# Patient Record
Sex: Female | Born: 1945
Health system: Southern US, Community
[De-identification: ages and names within clinical notes are randomized; demographics above are authoritative.]

## PROBLEM LIST (undated history)

## (undated) DIAGNOSIS — J45909 Unspecified asthma, uncomplicated: Secondary | ICD-10-CM

## (undated) DIAGNOSIS — H409 Unspecified glaucoma: Secondary | ICD-10-CM

## (undated) DIAGNOSIS — G629 Polyneuropathy, unspecified: Secondary | ICD-10-CM

## (undated) DIAGNOSIS — D649 Anemia, unspecified: Secondary | ICD-10-CM

## (undated) DIAGNOSIS — K2970 Gastritis, unspecified, without bleeding: Secondary | ICD-10-CM

## (undated) DIAGNOSIS — M810 Age-related osteoporosis without current pathological fracture: Secondary | ICD-10-CM

## (undated) DIAGNOSIS — E785 Hyperlipidemia, unspecified: Secondary | ICD-10-CM

## (undated) DIAGNOSIS — K219 Gastro-esophageal reflux disease without esophagitis: Secondary | ICD-10-CM

## (undated) DIAGNOSIS — I639 Cerebral infarction, unspecified: Secondary | ICD-10-CM

## (undated) DIAGNOSIS — T7840XA Allergy, unspecified, initial encounter: Secondary | ICD-10-CM

## (undated) DIAGNOSIS — E119 Type 2 diabetes mellitus without complications: Secondary | ICD-10-CM

## (undated) DIAGNOSIS — Z87448 Personal history of other diseases of urinary system: Secondary | ICD-10-CM

## (undated) DIAGNOSIS — N189 Chronic kidney disease, unspecified: Secondary | ICD-10-CM

## (undated) DIAGNOSIS — D510 Vitamin B12 deficiency anemia due to intrinsic factor deficiency: Secondary | ICD-10-CM

## (undated) DIAGNOSIS — M1990 Unspecified osteoarthritis, unspecified site: Secondary | ICD-10-CM

## (undated) DIAGNOSIS — I10 Essential (primary) hypertension: Secondary | ICD-10-CM

## (undated) DIAGNOSIS — Z5189 Encounter for other specified aftercare: Secondary | ICD-10-CM

## (undated) DIAGNOSIS — H40059 Ocular hypertension, unspecified eye: Secondary | ICD-10-CM

## (undated) HISTORY — DX: Hyperlipidemia, unspecified: E78.5

## (undated) HISTORY — DX: Chronic kidney disease, unspecified: N18.9

## (undated) HISTORY — DX: Unspecified glaucoma: H40.9

## (undated) HISTORY — DX: Encounter for other specified aftercare: Z51.89

## (undated) HISTORY — DX: Age-related osteoporosis without current pathological fracture: M81.0

## (undated) HISTORY — PX: GALLBLADDER SURGERY: SHX652

## (undated) HISTORY — DX: Allergy, unspecified, initial encounter: T78.40XA

## (undated) HISTORY — DX: Unspecified asthma, uncomplicated: J45.909

---

## 1973-07-13 HISTORY — PX: TUBAL LIGATION: SHX77

## 1986-07-13 HISTORY — PX: OTHER SURGICAL HISTORY: SHX169

## 2000-10-22 ENCOUNTER — Encounter: Admission: RE | Admit: 2000-10-22 | Discharge: 2000-10-22 | Payer: Self-pay | Admitting: Oncology

## 2000-12-16 ENCOUNTER — Encounter (HOSPITAL_COMMUNITY): Admission: RE | Admit: 2000-12-16 | Discharge: 2001-01-15 | Payer: Self-pay | Admitting: Oncology

## 2001-02-09 ENCOUNTER — Encounter: Admission: RE | Admit: 2001-02-09 | Discharge: 2001-02-09 | Payer: Self-pay | Admitting: Oncology

## 2001-04-06 ENCOUNTER — Encounter: Admission: RE | Admit: 2001-04-06 | Discharge: 2001-04-06 | Payer: Self-pay | Admitting: Oncology

## 2001-05-16 ENCOUNTER — Encounter: Payer: Self-pay | Admitting: Family Medicine

## 2001-05-16 ENCOUNTER — Ambulatory Visit (HOSPITAL_COMMUNITY): Admission: RE | Admit: 2001-05-16 | Discharge: 2001-05-16 | Payer: Self-pay | Admitting: Family Medicine

## 2001-05-27 ENCOUNTER — Ambulatory Visit (HOSPITAL_COMMUNITY): Admission: RE | Admit: 2001-05-27 | Discharge: 2001-05-27 | Payer: Self-pay | Admitting: Family Medicine

## 2001-05-27 ENCOUNTER — Encounter: Payer: Self-pay | Admitting: Family Medicine

## 2001-06-01 ENCOUNTER — Encounter: Admission: RE | Admit: 2001-06-01 | Discharge: 2001-06-01 | Payer: Self-pay | Admitting: Oncology

## 2001-09-19 ENCOUNTER — Encounter: Admission: RE | Admit: 2001-09-19 | Discharge: 2001-09-19 | Payer: Self-pay | Admitting: Oncology

## 2001-09-19 ENCOUNTER — Encounter (HOSPITAL_COMMUNITY): Admission: RE | Admit: 2001-09-19 | Discharge: 2001-10-19 | Payer: Self-pay | Admitting: Oncology

## 2002-01-04 ENCOUNTER — Encounter: Admission: RE | Admit: 2002-01-04 | Discharge: 2002-01-04 | Payer: Self-pay | Admitting: Oncology

## 2002-02-28 ENCOUNTER — Encounter: Admission: RE | Admit: 2002-02-28 | Discharge: 2002-02-28 | Payer: Self-pay | Admitting: Oncology

## 2002-04-25 ENCOUNTER — Encounter: Admission: RE | Admit: 2002-04-25 | Discharge: 2002-04-25 | Payer: Self-pay | Admitting: Oncology

## 2002-06-09 ENCOUNTER — Encounter: Payer: Self-pay | Admitting: Family Medicine

## 2002-06-09 ENCOUNTER — Ambulatory Visit (HOSPITAL_COMMUNITY): Admission: RE | Admit: 2002-06-09 | Discharge: 2002-06-09 | Payer: Self-pay | Admitting: Family Medicine

## 2002-08-16 ENCOUNTER — Encounter: Admission: RE | Admit: 2002-08-16 | Discharge: 2002-08-16 | Payer: Self-pay | Admitting: Oncology

## 2002-10-11 ENCOUNTER — Encounter (HOSPITAL_COMMUNITY): Admission: RE | Admit: 2002-10-11 | Discharge: 2002-11-10 | Payer: Self-pay | Admitting: Oncology

## 2002-10-11 ENCOUNTER — Encounter: Admission: RE | Admit: 2002-10-11 | Discharge: 2002-10-11 | Payer: Self-pay | Admitting: Oncology

## 2002-12-06 ENCOUNTER — Encounter: Admission: RE | Admit: 2002-12-06 | Discharge: 2002-12-06 | Payer: Self-pay | Admitting: Oncology

## 2003-01-30 ENCOUNTER — Encounter: Admission: RE | Admit: 2003-01-30 | Discharge: 2003-01-30 | Payer: Self-pay | Admitting: Oncology

## 2003-03-27 ENCOUNTER — Encounter: Admission: RE | Admit: 2003-03-27 | Discharge: 2003-03-27 | Payer: Self-pay | Admitting: Oncology

## 2003-05-22 ENCOUNTER — Encounter: Admission: RE | Admit: 2003-05-22 | Discharge: 2003-05-22 | Payer: Self-pay | Admitting: Oncology

## 2003-07-17 ENCOUNTER — Encounter: Admission: RE | Admit: 2003-07-17 | Discharge: 2003-07-17 | Payer: Self-pay | Admitting: Oncology

## 2003-09-10 ENCOUNTER — Encounter: Admission: RE | Admit: 2003-09-10 | Discharge: 2003-09-10 | Payer: Self-pay | Admitting: Oncology

## 2003-10-15 ENCOUNTER — Encounter: Admission: RE | Admit: 2003-10-15 | Discharge: 2003-10-15 | Payer: Self-pay | Admitting: Oncology

## 2003-10-15 ENCOUNTER — Encounter (HOSPITAL_COMMUNITY): Admission: RE | Admit: 2003-10-15 | Discharge: 2003-11-14 | Payer: Self-pay | Admitting: Oncology

## 2004-10-14 ENCOUNTER — Ambulatory Visit (HOSPITAL_COMMUNITY): Payer: Self-pay | Admitting: Oncology

## 2004-10-14 ENCOUNTER — Encounter (HOSPITAL_COMMUNITY): Admission: RE | Admit: 2004-10-14 | Discharge: 2004-11-13 | Payer: Self-pay | Admitting: Oncology

## 2004-10-14 ENCOUNTER — Encounter: Admission: RE | Admit: 2004-10-14 | Discharge: 2004-10-14 | Payer: Self-pay | Admitting: Oncology

## 2004-12-31 ENCOUNTER — Ambulatory Visit (HOSPITAL_COMMUNITY): Admission: RE | Admit: 2004-12-31 | Discharge: 2004-12-31 | Payer: Self-pay | Admitting: Family Medicine

## 2005-06-09 ENCOUNTER — Ambulatory Visit (HOSPITAL_COMMUNITY): Admission: RE | Admit: 2005-06-09 | Discharge: 2005-06-09 | Payer: Self-pay | Admitting: Family Medicine

## 2005-06-10 ENCOUNTER — Ambulatory Visit (HOSPITAL_COMMUNITY): Admission: RE | Admit: 2005-06-10 | Discharge: 2005-06-10 | Payer: Self-pay | Admitting: Family Medicine

## 2006-07-13 HISTORY — PX: CHOLECYSTECTOMY: SHX55

## 2007-04-16 ENCOUNTER — Inpatient Hospital Stay (HOSPITAL_COMMUNITY): Admission: EM | Admit: 2007-04-16 | Discharge: 2007-04-20 | Payer: Self-pay | Admitting: Emergency Medicine

## 2007-04-18 ENCOUNTER — Encounter (INDEPENDENT_AMBULATORY_CARE_PROVIDER_SITE_OTHER): Payer: Self-pay | Admitting: General Surgery

## 2010-08-02 ENCOUNTER — Encounter: Payer: Self-pay | Admitting: Family Medicine

## 2010-08-04 ENCOUNTER — Ambulatory Visit (HOSPITAL_COMMUNITY)
Admission: RE | Admit: 2010-08-04 | Discharge: 2010-08-04 | Payer: Self-pay | Source: Home / Self Care | Attending: Nephrology | Admitting: Nephrology

## 2010-11-25 NOTE — H&P (Signed)
NAME:  Traci Parker, Traci Parker NO.:  [id number]   MEDICAL RECORD NO.:  [id number]         PATIENT TYPE:  EMS   LOCATION:  ED                            FACILITY:  [facility]   PHYSICIAN:  Dorris Singh, DO    DATE OF BIRTH:  09/23/1945   DATE OF ADMISSION:  04/15/2007  DATE OF DISCHARGE:  LH                              HISTORY & PHYSICAL   REPORT TITLE:  HISTORY AND PHYSICAL   The patient is a 65 year old African-American female who presented to  the Emergency Room at [facility] with a history of abdominal pain,  nausea, and vomiting that was getting progressively worse.  Nothing  relieves it, everything irritated it.  Stools were liquid at that point  in time she was evaluated here.   PAST MEDICAL HISTORY:  Significant for hypertension and depression.  Has  a history of anemia, but is not anemic now.  Currently she also has a  past medical history of depression in which she is taking medication,  but did not give [place] the name.   PAST SURGICAL HISTORY:  She has had a lumpectomy and tubal ligation.   OCCUPATIONAL HISTORY:  The patient currently is a Nature conservation officer, part-time.   SOCIAL HISTORY:  She is a nondrinker, nondrug user, has smoked though  within the last year.   ALLERGIES:  Her ALLERGIES ARE PREDNISONE, which she claims she had  hallucinations with.   CURRENT MEDICATIONS:  Diazide.   REVIEW OF SYSTEMS:  GENERAL:  Positive weakness and fatigue.  CARDIOVASCULAR:  Negative for chest pain.  RESPIRATORY:  Negative for  cough, dyspnea.  GI:  Positive for nausea, vomiting, diarrhea, and  abdominal pain as well, as the pertinent positives there.   PHYSICAL EXAMINATION:  VITAL SIGNS:  Blood pressure 157/79, pulse rate  96, respiratory rate 20, temperature 98.4.  Satting at 97% on room air.  GENERAL:  This is a 65 year old African-American female, who is well  developed, well nourished.  Answers questions appropriately.  SKIN:  Her skin has good turgor, good texture.  No  ecchymosis noted.  HEAD:  Normocephalic atraumatic.  EYES:  No scleral icterus.  PERRLA, EOMI bilaterally.  EARS:  Are patent.  TMs visualized bilaterally.  NOSE:  Turbinates are moist.  No turbinate inflammation.  THROAT:  No dentition is noted, no erythema, or exudates noted.  NECK:  Supple, full range of motion, no trachea deviation.  CHEST:  Symmetrical with respiration.  Clear to auscultation  bilaterally.  No wheezes rales or rhonchi.  CARDIOVASCULAR:  Regular rate and rhythm, no murmur noted.  ABDOMEN:  Soft, nontender, nondistended.  Mild epigastric tenderness, no  masses, guarding, or rebound noted, or CVA.  EXTREMITIES:  Positive pulses in all 4 extremities.  No ecchymosis,  cyanosis, or edema noted.  NEUROLOGIC:  Cranial nerves II-XII grossly intact.  A&O x 3, affect is  appropriate.   LABORATORY DATA:  Lipase is 1026, her urine microscopic exam there was  rare epithelial cells.  Her urine was positive for moderate blood,  negative nitrates and esterase.  Her BMP; sodium 129,  potassium 2.7,  chloride 92, carbon dioxide 28, glucose 207, BUN 10, and creatinine  1.15. Her CBC with diff white count is 17.1, hemoglobin 11.6, hematocrit  35.2, neutrophils 95% with a left shift,  platelet count is 253.  The  patient also has been sent for an abdominal CT, which is pending as  well.   ASSESSMENT:  1. Acute pancreatitis.  2. Hypertension.   PLAN:  Will admit the patient to the service of [facility] to 2A with  Telemetry.  Will keep patient n.p.o. except for meds.  Will also  institute fluid and antibiotic therapy due to the elevated white count.  Also her CT, which is pending and an ultrasound of the abdomen.  I will  repeat the blood work in the morning and will continue to monitor.      Dorris Singh, DO  Electronically Signed     CB/MEDQ  D:  04/16/2007  T:  04/16/2007  Job:  [id number]

## 2010-11-25 NOTE — Op Note (Signed)
Traci Parker, GENSEL NO.:  [id number]   MEDICAL RECORD NO.:  [id number]          PATIENT TYPE:  INP   LOCATION:  A214                          FACILITY:  [facility]   PHYSICIAN:  Tilford Pillar, MD      DATE OF BIRTH:  1946/02/12   DATE OF PROCEDURE:  04/18/2007  DATE OF DISCHARGE:                               OPERATIVE REPORT   PREOPERATIVE DIAGNOSIS:  Gallstone pancreatitis.   POSTOPERATIVE DIAGNOSIS:  Gallstone pancreatitis.   PROCEDURE:  1. Laparoscopic cholecystectomy with intraoperative cholangiogram.  2. Intraoperative fluoroscopy with interpretation.  3. Intra-abdominal drain placement with a 10 Blake drain (JP).   SURGEON:  Dr. Lovell Sheehan and Tilford Pillar, M.D.   ANESTHESIA:  General endotracheal, local anesthetic of 0.5% Marcaine.   ESTIMATED BLOOD LOSS:  250 mL.   SPECIMENS:  Gallbladder.   DISPOSITION:  Postanesthetic care unit.   INDICATIONS FOR PROCEDURE:  The patient is a 65 year old African-  American female who had presented to [facility] with  epigastric and right upper quadrant pain along with nausea and vomiting.  During her initial workup, she was diagnosed with pancreatitis.  Additional workup demonstrated likely gallstone etiology.  The risks,  benefits, and alternatives of laparoscopic and possible open  cholecystectomy with intraoperative cholangiogram were discussed at  length with the patient and the patient's family.  Risks included, but  were not limited to possible risks of infection, risks of bleeding,  possible bile leak, possible common bile duct injury or intestinal  injury, and the risk of possible intraoperative secondary to unforeseen  cardiac pulmonary disorders.  The patient's questions and families  questions and concerns were addressed and the patient was consented for  the planned procedure.   DESCRIPTION OF PROCEDURE:  The patient was taken to the operating room  and was placed in the supine  position on the operating table at which  time the general anesthesia was administered.  Once the patient was  asleep, she was endotracheally intubated by anesthesia.  At this point,  the patient's abdomen was prepped and draped in the usual sterile  fashion.  Skin incision was created supraumbilically with an 11 blade  scalpel.  A Kocher clamp was utilized to bluntly dissect down to the  fascia and grasped anteriorly.  A Veress needle was then inserted.  Once  sufficient pneumoperitoneum was obtained, an 11 mm trocar was inserted  using the scope to visualize entry into the peritoneal cavity.  The  inner cannula was removed and the laparoscope was inserted.  There was  no evidence of any trocar or Veress needle placement injury.  At this  point in time, the remaining trocars were placed and in a similar  fashion making the skin incision with an 11 blade scalpel and then  placement of the trocars under direct visualization.  An 11 mm trocar  was placed in the epigastrium, 5 mm trocar placed in the right subcostal  position in the midclavicular line, and the 5 mm trocar was placed in  the right lateral abdominal wall.  At this point, the  patient was placed  in a head-up and left lateral decubitus position to assist with  exposure.  The gallbladder was identified.  This was clearly acutely  inflamed and edematous.  A grasper was utilized to grasp the fundus of  the gallbladder and lift this up and over the liver.  At this point  blunt dissection was utilized to expose the infundibulum of the  gallbladder.  A small cholecystostomy was created inadvertently with the  grasper which did allow some bile and apparent purulent material as well  as some dark bilious stones to exude from the wound.  These were  aspirated with the suction irrigator in attempt to get as many of the  stones as possible was again attempted with both irrigation and  aspiration.  At this point continued dissection  identified the cystic  duct and what appeared to be a vascular structure heading toward the  gallbladder.  At this point the window was created behind the suspected  cystic artery.  Two Endoclips were placed proximally and one distally  and the cystic artery was divided between the two most distal clips.  At  this point continued dissection clearly identified the cystic duct.  A  window was created behind it.  The cystic duct was milked with the  [place] dissector in order to ensure no stones were present within the  duct and Endoclip was placed as duct entered into the infundibulum.  At  this point the Endoshears were brought into the field.  A small defect  was created in the cystic duct and at this point, the cholangiocatheter  was brought to the field.  This was manipulated and inserted into the  cystic duct, also clamped into position.  It easily flushed.  There was  no evidence of extravasation of the sterile saline that was used for  initial flushing.  At this point, all graspers were removed from the  abdominal cavity.  Fluoroscopy was brought to the field and contrast was  infused through the cholangiocatheter system.  At this point there was  no evidence of any common bile duct stones. The common bile duct was  noted to be somewhat dilated and there was some back flushing back into  the pancreatic duct, but again no stones were identified at the ampulla  or within the common bile duct.  Another approximately 15 mL of sterile  saline were utilized then to irrigate and to flush the common bile duct.  At this point the fluoroscopy was backed away from the field and we  turned our attention to continuing the dissection.   At this point, three Endoclips were placed in the proximal cystic duct  and the remaining tissue of the cystic duct was divided between the two  most distal clips.  At this point, using electrocautery the gallbladder  was dissected free from the gallbladder fossa  of the liver.  Hemorrhage  was controlled using the electrocautery.  Again the gallbladder was  quite edematous and secondary to inflammatory changes to increase the  length of time required to remove the gallbladder from the gallbladder  fossa.  At this point, once it was free, it was placed up and over the  liver and attention at this time was returned to the gallbladder fossa  to ensure adequate hemostasis.  The field was irrigated with copious  amount of sterile saline and was aspirated until return aspirate was  clear.  Again, hemostasis was obtained using electrocautery and then  placement  of two pieces of Surgicel with the gallbladder fossa.  At this  time the patient was returned to a flat position and a 10 flat JP drain  was placed into the surgical field.  The tail of the JP drain was pulled  through the right lateral abdominal wall 5 mm trocar site.  Additionally  at this point, the endosuture closure device was utilized to pass an 0  Vicryl suture through the epigastric trocar site with the fascial suture  in position.  The camera was repositioned at the epigastric site and  again the endosuture closure device was utilized to pass a 2-0 Vicryl  through the umbilical trocar site.  With this fascial suture in place,  the camera was returned through the umbilicus.  The EndoCatch bag was  utilized to place the gallbladder and the gallbladder was removed  through the epigastric trocar site in an intact EndoCatch bag.  The  gallbladder was then placed on the back table and was sent as a  permanent specimen to pathology.  At this point the pneumoperitoneum was  evacuated.  The trocars were removed.  The drain was secured to the skin  with a 3-0 nylon suture.  The fascial Vicryl sutures were secured.  The  local anesthetic was injected and the skin edges at the remaining three  trocar sites was reapproximated using a 4-0 Monocryl suture.  The skin  edges were reapproximated and the  skin was washed and dried with a  moistened dry towel.  Benzoin was applied to the remaining three trocar  sites and half inch Steri-Strips were placed over the incision.  The  patient was awakened from general anesthesia and was transferred to her  hospital bed and was  transferred to the postanesthetic care unit in stable condition.  At the  conclusion of the procedure all instrument, sponge, and needle counts  were correct.  The patient tolerated the procedure well.  Dr. Lovell Sheehan  was also present during the entirety of the operation.      Tilford Pillar, MD  Electronically Signed     BZ/MEDQ  D:  04/18/2007  T:  04/19/2007  Job:  [id number]   cc:   Dalia Heading, M.D.  Fax: [phone]   [facility] Team

## 2010-11-25 NOTE — Consult Note (Signed)
NAMEKAYSEY, BERNDT NO.:  [id number]   MEDICAL RECORD NO.:  [id number]          PATIENT TYPE:  INP   LOCATION:  A214                          FACILITY:  [facility]   PHYSICIAN:  Tilford Pillar, MD      DATE OF BIRTH:  1946/04/27   DATE OF CONSULTATION:  04/18/2007  DATE OF DISCHARGE:                                 CONSULTATION   REASON FOR CONSULTATION:  Acute pancreatitis.   HISTORY OF PRESENT ILLNESS:  The patient is a 65 year old African-  American female with a history of nausea and vomiting and epigastric  abdominal pain.  She had presented on this admission, on October 3, with  increasing nausea, non-bloody emesis, epigastric and right upper  quadrant abdominal pain and diarrhea.  She did have some referred pain  to her upper back.  Patient states a somewhat increase in symptoms  secondarily to p.o. intake but otherwise no exacerbating or relieving  factors.  She describes the pain as sharp in nature.  In regards to past  episodes, she says that episodes have been intermittent, have resolved  spontaneously typically after several days.  She has noted somewhat of  an increase in her frequencies, but again denies any particular pattern  associated with the symptomatology.  She denies any previous history of  peptic ulcer or gastrointestinal disorders with similar etiologies.  She  does state that there is a family history of gallbladder disease as  well.  She has had 5 previous pregnancies.  She denied any episodes of  jaundice or acholic stools.   PAST MEDICAL HISTORY:  Significant for:  1. Hypertension.  2. Anemia.  3. Depression.   PAST SURGICAL HISTORY:  1. Breast lumpectomy.  2. Tubal ligation.   MEDICATIONS:  Toprol.   In the hospital she is on Xopenex, Protonix, Zosyn, Zofran as well as  her Toprol dosing.   ALLERGIES:  REPORTED TO PREDNISONE.   SOCIAL:  1. She is a half a pack per day smoker.  2. She denies any alcohol use.  3. No  recreational drugs.  4. As previously mentioned, she has had 5 pregnancies.   PHYSICAL EXAMINATION:  VITAL SIGNS:  A temperature of 99.3, heart rate  90 to 110, respiratory rate is 20, blood pressure 106/66, O2 saturation  94% on 1L nasal cannula.  GENERAL EVALUATION:  She is in no acute distress.  She is alert and  oriented.  She is sitting upright in bed with family present in the  room.  HEENT:  Pupils are equal, round, reactive, extraocular movements are  intact.  There is no scleral icterus, no cervical lymphadenopathy.  PULMONARY:  She is clear to auscultation in both lower lung fields.  Her  respirations are unlabored.  CARDIOVASCULAR:  Heart is regular rate and rhythm.  No apparent murmur,  gallops or rub are apparent.  ABDOMEN:  She has positive bowel signs.  Her abdomen is soft, moderately  obese.  She does have epigastric and right upper quadrant tenderness.  No peritoneal signs are appreciated.  She does have a positive Murphy's  sign.  No hernias or masses are apparent.  EXTREMITIES:  Warm and dry.   LABORATORY AND RADIOGRAPHIC DATA:  CBC:  White blood cell count of 16.8,  hemoglobin 8.7, hematocrit 26.1, platelets 177,000.  Chemistry:  Sodium  134, potassium 3.6, chloride 103, bicarbonate 24, BUN 16, creatinine  1.44, blood glucose 101.  She has an albumin of 2.6.  Her total  bilirubin is 2.5, this is decreased from her admission values.  Amylase  and lipase 71 and 16, respectively; these are also down from her  admission values of 873 and 330, respectively.  Radiographic  Information:  Her right upper quadrant ultrasound did demonstrate the  positive stones.  There is positive gallbladder wall thickening.  Her  common bile duct on right upper quadrant ultrasound was measured at 6.3  mm.  Additionally, a CAT scan of her abdomen and pelvis did not  demonstrate any free air or free fluid, there is significant  inflammatory changes around the head of the pancreas, no  apparent air  within the pancreas.  There is positive gallstones in her gallbladder  and again questionable common bile duct dilatation.   ASSESSMENT AND PLAN:  Pancreatitis and cholelithiasis, suspected  gallstone pancreatitis is etiology.  Based on her findings and her  resolution of her pancreatitis, it was recommended that she undergo a  laparoscopic, possible open, cholecystectomy with intraoperative  cholangiogram.  The risks, benefits and alternatives were discussed at  length with the patient and the patient's family.  She is currently in a  nothing by mouth status, we will continue this with plans of taking the  patient to the operating room today.  She has already received a dose of  Zosyn for antibiotics.  This will be sufficient for her preoperative  antibiotic and will plan on stopping this as, again, there is no  evidence of any infectious or necrotic aspect to her pancreatitis.  Additionally, I will obtain an EKG due to the patient's age; however,  low suspicion of any cardiac process or cardiac history is suspected  based on the patient's history and physical findings.      Tilford Pillar, MD  Electronically Signed     BZ/MEDQ  D:  04/18/2007  T:  04/18/2007  Job:  [id number]   cc:   Patient's permanent record   Dr. Carollee Sires Team

## 2010-11-25 NOTE — Discharge Summary (Signed)
NAMEFEY, COGHILL NO.:  [id number]   MEDICAL RECORD NO.:  [id number]          PATIENT TYPE:  INP   LOCATION:  A214                          FACILITY:  [facility]   PHYSICIAN:  Tilford Pillar, MD      DATE OF BIRTH:  04-10-1946   DATE OF ADMISSION:  04/15/2007  DATE OF DISCHARGE:  10/08/2008LH                               DISCHARGE SUMMARY   ADMISSION DIAGNOSES:  Acute pancreatitis.   DISCHARGE DIAGNOSES:  1. Status post laparoscopic cholecystectomy for gallstone      pancreatitis.  2. Hypertension.  3. Depression.  4. Anemia.   ADMITTING PHYSICIAN:  Dr. Dorris Singh.   PROCEDURES:  Laparoscopic cholecystectomy.   DISPOSITION:  Home.   HISTORY AND PHYSICAL:  Please see the admission history and physical for  the complete H&P.  Patient is a 65 year old African-American female who  presented with acute abdominal pain, nausea and vomiting.  She was  evaluated and was determined to have acute pancreatitis.  During the  workup, it was discovered that the patient had positive cholelithiasis.  Other etiologies were ruled out as possible etiologies for pancreatitis,  but this is suspected that this was related to a gallstone pancreatitis.   HOSPITAL COURSE:  The patient was admitted to the hospital on April 16, 2007, where she continued to have management for her acute pancreatitis.  During this, it was worked up and confirmed that her likely etiology was  gallstone pancreatitis.  She continued to have improvement on her blood  work with resolution of her pancreatitis as well as clinically improved  from the standpoint of nausea and pain relief.  A surgical consultation  was obtained, at which time I had the opportunity to meet the patient.  At this point, based on the review of her workup and findings, it was  recommended that she undergo a laparoscopic possible open  cholecystectomy with intraoperative cholangiogram.  Risks, benefits and  alternatives were  described at length with the patient and the patient's  family.  She had been consented for the operation and was taken to the  operating room for the above-mentioned procedure.  She was taken on  April 17, 2007.  She tolerated the procedure well.  Following her  operation, she continued to have improvement of her symptoms, was  advanced onto a regular diet.  Resolution of all of the pancreatic  enzymes.   On April 20, 2007, this patient was comfortable, tolerating a regular  diet.  Her pain was controlled.  Plans were made for discharge to home.   DISCHARGE INSTRUCTIONS:  Patient is to return to work as tolerated, if  possible, on a light duty basis for the next three weeks.  She is to  increase her activity slowly.  She is not to lift anything greater than  20 pounds for the next three weeks.  She may shower; however, she may  avoid bathing and soaking the incisions.  She is instructed not to drive  while taking the pain medications.  She will have a follow-up  appointment to see myself  in three weeks unless she has any questions,  problems, or concerns in the meantime, which she may see me sooner.   DISCHARGE MEDICATIONS:  Patient is to continue all previously prescribed  home medications, including Dyazide.  Additionally, she will be given a  prescription for Vicodin for pain control, 500 mg 1-2 tablets orally  every 4 hours as needed for pain.      Tilford Pillar, MD  Electronically Signed     BZ/MEDQ  D:  04/20/2007  T:  04/20/2007  Job:  [id number]   cc:   [facility] team   Kirk Ruths, M.D.  Fax: [phone]

## 2010-11-25 NOTE — Group Therapy Note (Signed)
Traci Parker, NOLDE NO.:  [id number]   MEDICAL RECORD NO.:  [id number]          PATIENT TYPE:  INP   LOCATION:  A214                          FACILITY:  [facility]   PHYSICIAN:  Skeet Latch, DO    DATE OF BIRTH:  26-Oct-1945   DATE OF PROCEDURE:  04/18/2007  DATE OF DISCHARGE:                                 PROGRESS NOTE   SUBJECTIVE:  The patient states that her pain is still located in right  upper and lower quadrant.  She states the pain is slightly improved but  is still present.  The patient has decreased nausea and vomiting.  The  patient is seen by surgery and scheduled for a cholecystectomy today.   OBJECTIVE:  VITAL SIGNS: Temperature is 99.3, pulse 90, respirations 20,  blood pressure 106/66.  The patient is satting at 94% on 1 liter.  CARDIOVASCULAR:  Regular rate and rhythm.  No rubs, gallops or murmurs.  LUNGS: Clear to auscultation bilaterally.  No rales, rhonchi or  wheezing.  ABDOMEN: Positive right upper quadrant and mid abdominal tenderness on  palpation.  No rigidity or guarding.  EXTREMITIES: No clubbing, cyanosis or edema.   LABORATORY DATA:  Lipase is 16, amylase 71, sodium 134, potassium 3.6,  chloride is 103, CO2 24, glucose 101, BUN 16, creatinine 1.44.  white  count is 16.8, hemoglobin at 8.7, hematocrit 26.1, platelet count is  177.   ASSESSMENT:  1. Acute pancreatitis seems to have resolved, probably secondary to      gallstones.  2. Cholelithiasis.  The patient is scheduled for a cholecystectomy      today and spoke with surgery regarding plans.  3. Renal insufficiency. Continue to IV hydrate the patient.  The      patient declined renal consult.  Will speak with the patient      regarding this after surgery.  4. Her hyponatremia seems to be improving.  Will continue present      treatment.      Skeet Latch, DO  Electronically Signed     SM/MEDQ  D:  04/18/2007  T:  04/18/2007  Job:  [id number]

## 2010-12-03 ENCOUNTER — Other Ambulatory Visit (HOSPITAL_COMMUNITY): Payer: Self-pay | Admitting: Internal Medicine

## 2010-12-03 DIAGNOSIS — I10 Essential (primary) hypertension: Secondary | ICD-10-CM

## 2010-12-03 DIAGNOSIS — Z139 Encounter for screening, unspecified: Secondary | ICD-10-CM

## 2010-12-11 ENCOUNTER — Ambulatory Visit (HOSPITAL_COMMUNITY)
Admission: RE | Admit: 2010-12-11 | Discharge: 2010-12-11 | Disposition: A | Discharge status: Home / Self Care | Payer: PRIVATE HEALTH INSURANCE | Source: Ambulatory Visit | Attending: Internal Medicine | Admitting: Internal Medicine

## 2010-12-11 ENCOUNTER — Encounter (HOSPITAL_COMMUNITY): Payer: Self-pay

## 2010-12-11 DIAGNOSIS — Z13820 Encounter for screening for osteoporosis: Secondary | ICD-10-CM | POA: Insufficient documentation

## 2010-12-11 DIAGNOSIS — Z139 Encounter for screening, unspecified: Secondary | ICD-10-CM

## 2010-12-11 DIAGNOSIS — I743 Embolism and thrombosis of arteries of the lower extremities: Secondary | ICD-10-CM | POA: Insufficient documentation

## 2010-12-11 DIAGNOSIS — I10 Essential (primary) hypertension: Secondary | ICD-10-CM

## 2010-12-11 HISTORY — DX: Essential (primary) hypertension: I10

## 2011-04-16 ENCOUNTER — Telehealth (INDEPENDENT_AMBULATORY_CARE_PROVIDER_SITE_OTHER): Payer: Self-pay | Admitting: *Deleted

## 2011-04-16 NOTE — Telephone Encounter (Signed)
She had question about her procedure on the 18th.  She got a call from her doctor about her sodium not being right.  She is going to have more labs at Dr. Gary Fleet Monday and they will call us if Sodium is high.

## 2011-04-23 LAB — COMPREHENSIVE METABOLIC PANEL
ALT: 71 — ABNORMAL HIGH
ALT: 95 — ABNORMAL HIGH
AST: 47 — ABNORMAL HIGH
Albumin: 2.6 — ABNORMAL LOW
Albumin: 2.9 — ABNORMAL LOW
Albumin: 3.5
Alkaline Phosphatase: 105
Alkaline Phosphatase: 73
BUN: 10
BUN: 16
BUN: 16
CO2: 23
CO2: 26
CO2: 26
Calcium: 8.1 — ABNORMAL LOW
Calcium: 8.4
Chloride: 103
Chloride: 97
Creatinine, Ser: 1.31 — ABNORMAL HIGH
Creatinine, Ser: 1.44 — ABNORMAL HIGH
Creatinine, Ser: 1.48 — ABNORMAL HIGH
GFR calc Af Amer: 36 — ABNORMAL LOW
GFR calc Af Amer: 43 — ABNORMAL LOW
GFR calc non Af Amer: 36 — ABNORMAL LOW
GFR calc non Af Amer: 37 — ABNORMAL LOW
Glucose, Bld: 141 — ABNORMAL HIGH
Glucose, Bld: 153 — ABNORMAL HIGH
Potassium: 3.8
Potassium: 3.9
Potassium: 4
Sodium: 128 — ABNORMAL LOW
Sodium: 132 — ABNORMAL LOW
Sodium: 139
Total Bilirubin: 1
Total Bilirubin: 2.5 — ABNORMAL HIGH
Total Bilirubin: 4 — ABNORMAL HIGH
Total Protein: 5.3 — ABNORMAL LOW
Total Protein: 6.2

## 2011-04-23 LAB — URINALYSIS, ROUTINE W REFLEX MICROSCOPIC
Ketones, ur: NEGATIVE
Protein, ur: 100 — AB
pH: 6.5

## 2011-04-23 LAB — CBC
HCT: 26.1 — ABNORMAL LOW
HCT: 28.6 — ABNORMAL LOW
Hemoglobin: 11.6 — ABNORMAL LOW
Hemoglobin: 11.8 — ABNORMAL LOW
Hemoglobin: 9.3 — ABNORMAL LOW
Hemoglobin: 9.5 — ABNORMAL LOW
MCHC: 33.2
MCHC: 33.3
MCHC: 33.4
MCV: 67.7 — ABNORMAL LOW
MCV: 68.2 — ABNORMAL LOW
MCV: 70.9 — ABNORMAL LOW
Platelets: 177
Platelets: 196
Platelets: 250
Platelets: 253
RBC: 3.97
RBC: 4.03
RBC: 4.42
RBC: 5.11
RDW: 21.3 — ABNORMAL HIGH
RDW: 24.1 — ABNORMAL HIGH
WBC: 16.8 — ABNORMAL HIGH
WBC: 21.9 — ABNORMAL HIGH

## 2011-04-23 LAB — MAGNESIUM: Magnesium: 1.7

## 2011-04-23 LAB — AMYLASE
Amylase: 284 — ABNORMAL HIGH
Amylase: 33

## 2011-04-23 LAB — LIPASE, BLOOD
Lipase: 1026 — ABNORMAL HIGH
Lipase: 16

## 2011-04-23 LAB — DIFFERENTIAL
Basophils Absolute: 0
Basophils Relative: 0
Basophils Relative: 0
Basophils Relative: 0
Basophils Relative: 1
Eosinophils Absolute: 0
Eosinophils Absolute: 0
Eosinophils Absolute: 0
Eosinophils Absolute: 0
Eosinophils Absolute: 0.2
Eosinophils Relative: 0
Eosinophils Relative: 0
Eosinophils Relative: 0
Lymphocytes Relative: 3 — ABNORMAL LOW
Lymphocytes Relative: 6 — ABNORMAL LOW
Lymphocytes Relative: 6 — ABNORMAL LOW
Lymphocytes Relative: 6 — ABNORMAL LOW
Lymphs Abs: 0.5 — ABNORMAL LOW
Lymphs Abs: 0.9
Lymphs Abs: 1.3
Monocytes Absolute: 0.9 — ABNORMAL HIGH
Monocytes Absolute: 1.1 — ABNORMAL HIGH
Monocytes Relative: 4
Monocytes Relative: 5
Neutro Abs: 14.7 — ABNORMAL HIGH
Neutro Abs: 16.3 — ABNORMAL HIGH
Neutro Abs: 16.9 — ABNORMAL HIGH
Neutro Abs: 19.3 — ABNORMAL HIGH
Neutrophils Relative %: 85 — ABNORMAL HIGH
Neutrophils Relative %: 87 — ABNORMAL HIGH
Neutrophils Relative %: 93 — ABNORMAL HIGH

## 2011-04-23 LAB — HEPATIC FUNCTION PANEL
ALT: 108 — ABNORMAL HIGH
AST: 185 — ABNORMAL HIGH
Alkaline Phosphatase: 126 — ABNORMAL HIGH
Bilirubin, Direct: 1.8 — ABNORMAL HIGH
Total Protein: 6.9

## 2011-04-23 LAB — CROSSMATCH: Antibody Screen: NEGATIVE

## 2011-04-23 LAB — BASIC METABOLIC PANEL
BUN: 10
Calcium: 8.9
Creatinine, Ser: 1.15
GFR calc Af Amer: 58 — ABNORMAL LOW
GFR calc non Af Amer: 48 — ABNORMAL LOW
Glucose, Bld: 207 — ABNORMAL HIGH
Sodium: 129 — ABNORMAL LOW

## 2011-04-23 LAB — URINE MICROSCOPIC-ADD ON

## 2011-04-23 LAB — HEMOGLOBIN AND HEMATOCRIT, BLOOD: HCT: 34.8 — ABNORMAL LOW

## 2011-04-28 ENCOUNTER — Other Ambulatory Visit (INDEPENDENT_AMBULATORY_CARE_PROVIDER_SITE_OTHER): Payer: Self-pay | Admitting: *Deleted

## 2011-04-28 DIAGNOSIS — Z1211 Encounter for screening for malignant neoplasm of colon: Secondary | ICD-10-CM

## 2011-04-29 MED ORDER — SODIUM CHLORIDE 0.45 % IV SOLN
Freq: Once | INTRAVENOUS | Status: AC
  Administered 2011-04-30: 09:00:00 via INTRAVENOUS

## 2011-04-30 ENCOUNTER — Ambulatory Visit (HOSPITAL_COMMUNITY)
Admission: RE | Admit: 2011-04-30 | Discharge: 2011-04-30 | Disposition: A | Discharge status: Home / Self Care | Payer: PRIVATE HEALTH INSURANCE | Source: Ambulatory Visit | Attending: Internal Medicine | Admitting: Internal Medicine

## 2011-04-30 ENCOUNTER — Other Ambulatory Visit (INDEPENDENT_AMBULATORY_CARE_PROVIDER_SITE_OTHER): Payer: Self-pay | Admitting: Internal Medicine

## 2011-04-30 ENCOUNTER — Encounter (INDEPENDENT_AMBULATORY_CARE_PROVIDER_SITE_OTHER): Payer: PRIVATE HEALTH INSURANCE | Admitting: Internal Medicine

## 2011-04-30 ENCOUNTER — Encounter (HOSPITAL_COMMUNITY)
Admission: RE | Disposition: A | Discharge status: Home / Self Care | Payer: Self-pay | Source: Ambulatory Visit | Attending: Internal Medicine

## 2011-04-30 ENCOUNTER — Encounter (HOSPITAL_COMMUNITY): Payer: Self-pay | Admitting: *Deleted

## 2011-04-30 DIAGNOSIS — Z1211 Encounter for screening for malignant neoplasm of colon: Secondary | ICD-10-CM | POA: Insufficient documentation

## 2011-04-30 DIAGNOSIS — K6389 Other specified diseases of intestine: Secondary | ICD-10-CM

## 2011-04-30 DIAGNOSIS — D126 Benign neoplasm of colon, unspecified: Secondary | ICD-10-CM

## 2011-04-30 DIAGNOSIS — Z79899 Other long term (current) drug therapy: Secondary | ICD-10-CM | POA: Insufficient documentation

## 2011-04-30 DIAGNOSIS — I10 Essential (primary) hypertension: Secondary | ICD-10-CM | POA: Insufficient documentation

## 2011-04-30 HISTORY — PX: COLONOSCOPY: SHX5424

## 2011-04-30 HISTORY — DX: Personal history of other diseases of urinary system: Z87.448

## 2011-04-30 HISTORY — DX: Anemia, unspecified: D64.9

## 2011-04-30 SURGERY — COLONOSCOPY
Anesthesia: Moderate Sedation

## 2011-04-30 MED ORDER — MIDAZOLAM HCL 5 MG/5ML IJ SOLN
INTRAMUSCULAR | Status: AC
  Filled 2011-04-30: qty 10

## 2011-04-30 MED ORDER — MEPERIDINE HCL 50 MG/ML IJ SOLN
INTRAMUSCULAR | Status: AC
  Filled 2011-04-30: qty 1

## 2011-04-30 MED ORDER — MIDAZOLAM HCL 5 MG/5ML IJ SOLN
INTRAMUSCULAR | Status: DC | PRN
  Administered 2011-04-30 (×4): 2 mg via INTRAVENOUS

## 2011-04-30 MED ORDER — MEPERIDINE HCL 50 MG/ML IJ SOLN
INTRAMUSCULAR | Status: DC | PRN
  Administered 2011-04-30 (×2): 25 mg via INTRAVENOUS

## 2011-04-30 MED ORDER — STERILE WATER FOR IRRIGATION IR SOLN
Status: DC | PRN
  Administered 2011-04-30: 10:00:00

## 2011-04-30 MED ORDER — SODIUM CHLORIDE 0.9 % IJ SOLN
INTRAMUSCULAR | Status: DC | PRN
  Administered 2011-04-30: 7 mL

## 2011-04-30 NOTE — H&P (Signed)
Traci Parker is an 65 y.o. female.   Chief Complaint: Patient is here for screening colonoscopy HPI: Patient is 66 year old African American female who is in her average risk screening colonoscopy. This is her first exam. She denies abdominal pain, change in her bowel habits or rectal bleeding. Family history is negative for colorectal carcinoma.  Past Medical History  Diagnosis Date  . Hypertension   . Anemia   . History of blood in urine     Past Surgical History  Procedure Date  . Cholecystectomy 2008  . Tubal ligation 1975  . Right breast cystectomy 1988    History reviewed. No pertinent family history. Social History:  reports that she quit smoking about 3 weeks ago. She does not have any smokeless tobacco history on file. She reports that she does not drink alcohol or use illicit drugs.  Allergies:  Allergies  Allergen Reactions  . Latex Rash  . Neosporin (Neomycin-Polymyx-Gramicid) Itching and Rash  . Prednisone Hives, Itching and Rash    Medications Prior to Admission  Medication Dose Route Frequency Provider Last Rate Last Dose  . 0.45 % sodium chloride infusion   Intravenous Once Malissa Hippo, MD 20 mL/hr at 04/30/11 0858    . meperidine (DEMEROL) 50 MG/ML injection           . midazolam (VERSED) 5 MG/5ML injection            Medications Prior to Admission  Medication Sig Dispense Refill  . amLODipine (NORVASC) 5 MG tablet Take 5 mg by mouth daily.        . calcium citrate-vitamin D (CITRACAL+D) 315-200 MG-UNIT per tablet Take 1 tablet by mouth 2 (two) times daily.        . cyanocobalamin (,VITAMIN B-12,) 1000 MCG/ML injection Inject 1,000 mcg into the muscle every 30 (thirty) days.          No results found for this or any previous visit (from the past 48 hour(s)). No results found.  Review of Systems  Constitutional: Weight loss: 4-5 pounds voluntary weight loss.  Gastrointestinal: Negative for abdominal pain, diarrhea, constipation, blood in stool and  melena.    Blood pressure 160/87, pulse 98, temperature 97.8 F (36.6 C), temperature source Oral, resp. rate 17, height 5\' 5"  (1.651 m), weight 163 lb (73.936 kg), SpO2 98.00%. Physical Exam  Constitutional: She appears well-developed and well-nourished.  HENT:  Mouth/Throat: Oropharynx is clear and moist.  Eyes: Conjunctivae are normal. No scleral icterus.  Neck: No thyromegaly present.  Cardiovascular: Normal rate, regular rhythm and normal heart sounds.   No murmur heard. Respiratory: Effort normal and breath sounds normal.  GI: Soft. Bowel sounds are normal. She exhibits no distension and no mass. There is no tenderness.  Musculoskeletal: She exhibits no edema.  Lymphadenopathy:    She has no cervical adenopathy.  Skin: Skin is warm and dry.     Assessment/Plan Average risk screening colonoscopy  Chau Savell U 04/30/2011, 9:20 AM

## 2011-04-30 NOTE — Op Note (Signed)
COLONOSCOPY PROCEDURE REPORT  PATIENT:  Traci Parker  MR#:  [id number] Birthdate:  08-22-1945, 65 y.o., female Endoscopist:  Dr. Malissa Hippo, MD Referred By:  Dr. Kirk Ruths, MD Procedure Date: 04/30/2011  Procedure:   Colonoscopy with snare polypectomy  Indications:  Patient is 65 year old African American female who is here for screening colonoscopy.  Informed Consent: Procedure and risks were reviewed with the patient and her questions were answered. Informed consent was obtained.  Medications:  Demerol 50 mg IV Versed 8 mg IV  Description of procedure:  After a digital rectal exam was performed, that colonoscope was advanced from the anus through the rectum and colon to the area of the cecum, ileocecal valve and appendiceal orifice. The cecum was deeply intubated. These structures were well-seen and photographed for the record. From the level of the cecum and ileocecal valve, the scope was slowly and cautiously withdrawn. The mucosal surfaces were carefully surveyed utilizing scope tip to flexion to facilitate fold flattening as needed. The scope was pulled down into the rectum where a thorough exam including retroflexion was performed.  Findings:   Prep fair. Redundant colon wall area behind the ileocecal valve not seen. Patchy pigmentation throughout the colon but less pronounced and ascending colon consistent with melanosis.  coli. 10 mm sessile polyp snared after elevating it with saline located at transverse colon. Single Hemoclip applied to polypectomy site to reduce the risk of bleeding. Another small polyp ablated via cold biopsy from splenic flexure. There is another  sessile polyp snared transverse colon but could not be found on the way out. This was due to of liquid stool in her colon. Anorectal junction unremarkable.  Therapeutic/Diagnostic Maneuvers Performed:  See above  Complications:  None  Cecal Withdrawal Time:  25 minutes  Impression:    Examination performed to cecum. Small area behind a valve not well seen. Redundant colon with suboptimal prep. 10 mm sessile polyp snared from transverse colon ordering saline injection. Single Hemoclip applied to polypectomy site to reduce the risk of bleeding. Another small polyp ablated via cold biopsy. Was located at splenic flexure. Another sessile polyp snared transverse colon could not be found on the way out. It was over 10 mm in maximal diameter. Melanosis coli.  Recommendations:  No aspirin for 10 days. Patient advised not to have an MRI until documented to have passed the clip. I will be contacting patient with results of biopsy and further recommendations.   Avigail Pilling U  04/30/2011 10:42 AM  CC: Dr. Kirk Ruths, MD & Dr. Bonnetta Barry ref. provider found

## 2011-05-04 ENCOUNTER — Encounter (INDEPENDENT_AMBULATORY_CARE_PROVIDER_SITE_OTHER): Payer: Self-pay | Admitting: *Deleted

## 2011-05-07 ENCOUNTER — Encounter (HOSPITAL_COMMUNITY): Payer: Self-pay | Admitting: Internal Medicine

## 2011-11-02 ENCOUNTER — Other Ambulatory Visit (HOSPITAL_COMMUNITY): Payer: Self-pay | Admitting: Internal Medicine

## 2011-11-02 DIAGNOSIS — Z139 Encounter for screening, unspecified: Secondary | ICD-10-CM

## 2011-12-14 ENCOUNTER — Ambulatory Visit (HOSPITAL_COMMUNITY)
Admission: RE | Admit: 2011-12-14 | Discharge: 2011-12-14 | Disposition: A | Discharge status: Home / Self Care | Payer: PRIVATE HEALTH INSURANCE | Source: Ambulatory Visit | Attending: Internal Medicine | Admitting: Internal Medicine

## 2011-12-14 DIAGNOSIS — Z1231 Encounter for screening mammogram for malignant neoplasm of breast: Secondary | ICD-10-CM | POA: Insufficient documentation

## 2011-12-14 DIAGNOSIS — Z139 Encounter for screening, unspecified: Secondary | ICD-10-CM

## 2012-04-20 ENCOUNTER — Encounter (INDEPENDENT_AMBULATORY_CARE_PROVIDER_SITE_OTHER): Payer: Self-pay | Admitting: *Deleted

## 2012-05-04 ENCOUNTER — Other Ambulatory Visit (INDEPENDENT_AMBULATORY_CARE_PROVIDER_SITE_OTHER): Payer: Self-pay | Admitting: *Deleted

## 2012-05-04 ENCOUNTER — Telehealth (INDEPENDENT_AMBULATORY_CARE_PROVIDER_SITE_OTHER): Payer: Self-pay | Admitting: *Deleted

## 2012-05-04 DIAGNOSIS — Z1211 Encounter for screening for malignant neoplasm of colon: Secondary | ICD-10-CM

## 2012-05-04 DIAGNOSIS — Z86010 Personal history of colonic polyps: Secondary | ICD-10-CM

## 2012-05-04 NOTE — Telephone Encounter (Signed)
  Patient needs tri-lyte  PCP/Requesting MD: mcough  Name & DOB: Traci Parker 07-10-1946   Procedure: tcs  Reason/Indication:  Hx polyps  Has patient had this procedure before?  yes  If so, when, by whom and where?  04/2011  Is there a family history of colon cancer?  no  Who?  What age when diagnosed?    Is patient diabetic?   borderline      Does patient have prosthetic heart valve?  no  Do you have a pacemaker?  no  Has patient had joint replacement within last 12 months?  no  Is patient on Coumadin, Plavix and/or Aspirin? yes  Medications: asa 81 mg daily, amlodipine 5 mg daily, metformin 500 mg daily (am), simvastatin 20 mg daily, vitamin, b12 injection 100 mg monthly  Allergies: neosporin, prednisone, products w/ fish oil  Medication Adjustment: asa 2 days, hold metformin morning of procedure  Procedure date & time: 05/25/12 at 1:25

## 2012-05-05 MED ORDER — PEG 3350-KCL-NA BICARB-NACL 420 G PO SOLR: 4000.0000 mL | Freq: Once | ORAL | Status: DC

## 2012-05-05 NOTE — Addendum Note (Signed)
Addended by: Len Blalock on: 05/05/2012 04:06 PM   Modules accepted: Orders

## 2012-05-05 NOTE — Telephone Encounter (Signed)
agree

## 2012-05-05 NOTE — Addendum Note (Signed)
Addended by: Luster Landsberg H on: 05/05/2012 04:04 PM   Modules accepted: Orders

## 2012-05-17 ENCOUNTER — Encounter (HOSPITAL_COMMUNITY): Payer: Self-pay | Admitting: Pharmacy Technician

## 2012-05-17 ENCOUNTER — Encounter (INDEPENDENT_AMBULATORY_CARE_PROVIDER_SITE_OTHER): Payer: Self-pay

## 2012-05-25 ENCOUNTER — Encounter (HOSPITAL_COMMUNITY): Payer: Self-pay

## 2012-05-25 ENCOUNTER — Encounter (HOSPITAL_COMMUNITY)
Admission: RE | Disposition: A | Discharge status: Home / Self Care | Payer: Self-pay | Source: Ambulatory Visit | Attending: Internal Medicine

## 2012-05-25 ENCOUNTER — Ambulatory Visit (HOSPITAL_COMMUNITY)
Admission: RE | Admit: 2012-05-25 | Discharge: 2012-05-25 | Disposition: A | Discharge status: Home / Self Care | Payer: PRIVATE HEALTH INSURANCE | Source: Ambulatory Visit | Attending: Internal Medicine | Admitting: Internal Medicine

## 2012-05-25 DIAGNOSIS — Z860100 Personal history of colon polyps, unspecified: Secondary | ICD-10-CM | POA: Insufficient documentation

## 2012-05-25 DIAGNOSIS — D126 Benign neoplasm of colon, unspecified: Secondary | ICD-10-CM

## 2012-05-25 DIAGNOSIS — K6389 Other specified diseases of intestine: Secondary | ICD-10-CM

## 2012-05-25 DIAGNOSIS — Z01812 Encounter for preprocedural laboratory examination: Secondary | ICD-10-CM | POA: Insufficient documentation

## 2012-05-25 DIAGNOSIS — E119 Type 2 diabetes mellitus without complications: Secondary | ICD-10-CM | POA: Insufficient documentation

## 2012-05-25 DIAGNOSIS — I10 Essential (primary) hypertension: Secondary | ICD-10-CM | POA: Insufficient documentation

## 2012-05-25 HISTORY — DX: Type 2 diabetes mellitus without complications: E11.9

## 2012-05-25 HISTORY — PX: COLONOSCOPY: SHX5424

## 2012-05-25 LAB — GLUCOSE, CAPILLARY: Glucose-Capillary: 101 mg/dL — ABNORMAL HIGH (ref 70–99)

## 2012-05-25 SURGERY — COLONOSCOPY
Anesthesia: Moderate Sedation

## 2012-05-25 MED ORDER — STERILE WATER FOR IRRIGATION IR SOLN
Status: DC | PRN
  Administered 2012-05-25: 12:00:00

## 2012-05-25 MED ORDER — MEPERIDINE HCL 50 MG/ML IJ SOLN
INTRAMUSCULAR | Status: AC
  Filled 2012-05-25: qty 1

## 2012-05-25 MED ORDER — SODIUM CHLORIDE 0.45 % IV SOLN
INTRAVENOUS | Status: DC
  Administered 2012-05-25: 12:00:00 via INTRAVENOUS

## 2012-05-25 MED ORDER — MIDAZOLAM HCL 5 MG/5ML IJ SOLN
INTRAMUSCULAR | Status: DC | PRN
  Administered 2012-05-25 (×4): 2 mg via INTRAVENOUS

## 2012-05-25 MED ORDER — MIDAZOLAM HCL 5 MG/5ML IJ SOLN
INTRAMUSCULAR | Status: AC
  Filled 2012-05-25: qty 10

## 2012-05-25 MED ORDER — SODIUM CHLORIDE 0.45 % IV SOLN: INTRAVENOUS | Status: DC

## 2012-05-25 MED ORDER — MEPERIDINE HCL 50 MG/ML IJ SOLN
INTRAMUSCULAR | Status: DC | PRN
  Administered 2012-05-25 (×2): 25 mg via INTRAVENOUS

## 2012-05-25 NOTE — Op Note (Signed)
COLONOSCOPY PROCEDURE REPORT  PATIENT:  Traci Parker  MR#:  [id number] Birthdate:  [birth year], 66 y.o., female Endoscopist:  Dr. Malissa Hippo, MD Referred By:  Dr. Kirk Ruths, MD Procedure Date: 05/25/2012  Procedure:   Colonoscopy snare polypectomy.  Indications: Patient is 66 year old African American female with history of colonic adenomas. One polyp could not be removed on her exam last year. He is therefore returning for repeat colonoscopy.  Informed Consent:  The procedure and risks were reviewed with the patient and informed consent was obtained.  Medications:  Demerol 50 mg IV Versed 8 mg IV  Description of procedure:  After a digital rectal exam was performed, that colonoscope was advanced from the anus through the rectum and colon to the area of the cecum, ileocecal valve and appendiceal orifice. The cecum was deeply intubated. These structures were well-seen and photographed for the record. From the level of the cecum and ileocecal valve, the scope was slowly and cautiously withdrawn. The mucosal surfaces were carefully surveyed utilizing scope tip to flexion to facilitate fold flattening as needed. The scope was pulled down into the rectum where a thorough exam including retroflexion was performed.  Findings:   Prep fair. Redundant sigmoid colon and hepatic flexure. 3 small polyps ablated via cold biopsy and submitted together(hepatic flexure and transverse colon). 7-8 mm snared from distal sigmoid colon. Patchy mucosal pigmentation predominantly in the distal half of the colon and rectum. Anorectal junction unremarkable.   Therapeutic/Diagnostic Maneuvers Performed:  see above  Complications:  None  Cecal Withdrawal Time:  21 minutes  Impression:  Examination performed to cecum(pictures of appendiceal orifice lost well-seen by everyone in the room). 3 small polyps ablated via cold biopsy and submitted together as above. 7-8 mm polyp snared from sigmoid  colon. Melanosis coli.  Recommendations:  Standard instructions given. I will contact patient with biopsy results and further recommendations.  Ryane Konieczny U  05/25/2012 12:53 PM  CC: Dr. Kirk Ruths, MD & Dr. Bonnetta Barry ref. provider found

## 2012-05-25 NOTE — H&P (Signed)
Traci Parker is an 66 y.o. female.   Chief Complaint: Patient is here for colonoscopy. HPI: Patient is 66 year old African female who underwent colonoscopy year ago and was found to have 10 mm tubular adenoma. She polyp which could not be found in the way out. Prep was marginal. She is returning for repeat exam. She denies abdominal pain rectal bleeding or change in bowel habits. Family history is negative for CRC .  Past Medical History  Diagnosis Date  . Hypertension   . Anemia   . History of blood in urine   . Diabetes mellitus without complication     Past Surgical History  Procedure Date  . Cholecystectomy 2008  . Tubal ligation 1975  . Right breast cystectomy 1988  . Colonoscopy 04/30/2011    Procedure: COLONOSCOPY;  Surgeon: Malissa Hippo, MD;  [facility];  Service: Endoscopy;  Laterality: N/A;    History reviewed. No pertinent family history. Social History:  reports that she quit smoking about 13 months ago. She does not have any smokeless tobacco history on file. She reports that she does not drink alcohol or use illicit drugs.  Allergies:  Allergies  Allergen Reactions  . Latex Rash  . Fish Allergy     rash  . Neosporin (Neomycin-Polymyxin-Gramicidin) Itching and Rash  . Prednisone Hives, Itching and Rash    Medications Prior to Admission  Medication Sig Dispense Refill  . amLODipine (NORVASC) 5 MG tablet Take 5 mg by mouth daily.        Marland Kitchen aspirin EC 81 MG tablet Take 81 mg by mouth daily.      . cyanocobalamin (,VITAMIN B-12,) 1000 MCG/ML injection Inject 1,000 mcg into the muscle every 30 (thirty) days.        . polyethylene glycol-electrolytes (NULYTELY/GOLYTELY) 420 G solution Take 4,000 mLs by mouth once.  4000 mL  0  . simvastatin (ZOCOR) 20 MG tablet Take 20 mg by mouth at bedtime.      . metFORMIN (GLUCOPHAGE) 500 MG tablet Take 500 mg by mouth daily with breakfast.        Results for orders placed during the hospital encounter of 05/25/12  (from the past 48 hour(s))  GLUCOSE, CAPILLARY     Status: Abnormal   Collection Time   05/25/12 11:39 AM      Component Value Range Comment   Glucose-Capillary 101 (*) 70 - 99 mg/dL    No results found.  ROS  Blood pressure 140/84, pulse 108, temperature 98 F (36.7 C), temperature source Oral, resp. rate 14, height 5\' 5"  (1.651 m), weight 167 lb (75.751 kg), SpO2 98.00%. Physical Exam  Constitutional: She appears well-developed and well-nourished.  HENT:  Mouth/Throat: Oropharynx is clear and moist.  Eyes: Conjunctivae normal are normal. No scleral icterus.  Neck: No thyromegaly present.  Cardiovascular: Normal rate, regular rhythm and normal heart sounds.   No murmur heard. Respiratory: Effort normal and breath sounds normal.  GI: Soft. She exhibits no distension and no mass. There is no tenderness.  Musculoskeletal: She exhibits no edema.  Lymphadenopathy:    She has no cervical adenopathy.  Neurological: She is alert.  Skin: Skin is warm and dry.     Assessment/Plan History of colonic adenomas. Surveillance colonoscopy.  REHMAN,NAJEEB U 05/25/2012, 11:48 AM

## 2012-05-27 ENCOUNTER — Encounter (HOSPITAL_COMMUNITY): Payer: Self-pay | Admitting: Internal Medicine

## 2012-06-08 ENCOUNTER — Encounter (INDEPENDENT_AMBULATORY_CARE_PROVIDER_SITE_OTHER): Payer: Self-pay | Admitting: *Deleted

## 2015-05-16 ENCOUNTER — Encounter (INDEPENDENT_AMBULATORY_CARE_PROVIDER_SITE_OTHER): Payer: Self-pay | Admitting: *Deleted

## 2017-09-02 LAB — HM MAMMOGRAPHY: HM Mammogram: NORMAL (ref 0–4)

## 2017-10-08 LAB — HM DEXA SCAN

## 2017-12-11 HISTORY — PX: COLONOSCOPY: SHX174

## 2018-03-13 LAB — HM COLONOSCOPY

## 2018-04-05 LAB — VITAMIN B12: Vitamin B-12: 593

## 2018-08-31 LAB — HEPATIC FUNCTION PANEL
ALT: 7 (ref 7–35)
AST: 10 — AB (ref 13–35)

## 2018-08-31 LAB — BASIC METABOLIC PANEL: Creatinine: 1.4 — AB (ref <=1.1)

## 2018-08-31 LAB — HEMOGLOBIN A1C: Hemoglobin A1C: 6.1

## 2019-03-23 ENCOUNTER — Other Ambulatory Visit: Payer: Self-pay | Admitting: Family Medicine

## 2019-03-23 ENCOUNTER — Encounter (INDEPENDENT_AMBULATORY_CARE_PROVIDER_SITE_OTHER): Payer: Self-pay

## 2019-03-23 ENCOUNTER — Ambulatory Visit (INDEPENDENT_AMBULATORY_CARE_PROVIDER_SITE_OTHER): Payer: Medicare Other | Admitting: Family Medicine

## 2019-03-23 ENCOUNTER — Encounter: Payer: Self-pay | Admitting: Family Medicine

## 2019-03-23 ENCOUNTER — Other Ambulatory Visit: Payer: Self-pay

## 2019-03-23 VITALS — BP 125/76 | HR 90 | Temp 97.9°F | Ht 64.0 in | Wt 147.4 lb

## 2019-03-23 DIAGNOSIS — R7303 Prediabetes: Secondary | ICD-10-CM | POA: Insufficient documentation

## 2019-03-23 DIAGNOSIS — E538 Deficiency of other specified B group vitamins: Secondary | ICD-10-CM

## 2019-03-23 DIAGNOSIS — R319 Hematuria, unspecified: Secondary | ICD-10-CM

## 2019-03-23 DIAGNOSIS — E785 Hyperlipidemia, unspecified: Secondary | ICD-10-CM | POA: Diagnosis not present

## 2019-03-23 DIAGNOSIS — Z0000 Encounter for general adult medical examination without abnormal findings: Secondary | ICD-10-CM | POA: Insufficient documentation

## 2019-03-23 DIAGNOSIS — Z1239 Encounter for other screening for malignant neoplasm of breast: Secondary | ICD-10-CM | POA: Diagnosis not present

## 2019-03-23 DIAGNOSIS — H4010X0 Unspecified open-angle glaucoma, stage unspecified: Secondary | ICD-10-CM | POA: Diagnosis not present

## 2019-03-23 DIAGNOSIS — I10 Essential (primary) hypertension: Secondary | ICD-10-CM

## 2019-03-23 LAB — POCT URINALYSIS DIP (CLINITEK)
Bilirubin, UA: NEGATIVE
Glucose, UA: NEGATIVE mg/dL
Ketones, POC UA: NEGATIVE mg/dL
Nitrite, UA: NEGATIVE
POC PROTEIN,UA: 30 — AB
Spec Grav, UA: 1.025 (ref 1.010–1.025)
Urobilinogen, UA: 1 E.U./dL
pH, UA: 5.5 (ref 5.0–8.0)

## 2019-03-23 LAB — POCT UA - MICROALBUMIN
Creatinine, POC: 300 mg/dL
Microalbumin Ur, POC: 150 mg/L

## 2019-03-23 NOTE — Patient Instructions (Addendum)
Smoothies for nutrition Soft casseroles  Eat a snack before bedtime Avoid caffeine after lunchtime Try to eliminate soda Fasting labwork-[facility] Soft-Food Eating Plan A soft-food eating plan includes foods that are safe and easy to chew and swallow. Your health care provider or dietitian can help you find foods and flavors that fit into this plan. Follow this plan until your health care provider or dietitian says it is safe to start eating other foods and food textures. What are tips for following this plan? General guidelines   Take small bites of food, or cut food into pieces about  inch or smaller. Bite-sized pieces of food are easier to chew and swallow.  Eat moist foods. Avoid overly dry foods.  Avoid foods that: ? Are difficult to swallow, such as dry, chunky, crispy, or sticky foods. ? Are difficult to chew, such as hard, tough, or stringy foods. ? Contain nuts, seeds, or fruits.  Follow instructions from your dietitian about the types of liquids that are safe for you to swallow. You may be allowed to have: ? Thick liquids only. This includes only liquids that are thicker than honey. ? Thin and thick liquids. This includes all beverages and foods that become liquid at room temperature.  To make thick liquids: ? Purchase a commercial liquid thickening powder. These are available at grocery stores and pharmacies. ? Mix the thickener into liquids according to instructions on the label. ? Purchase ready-made thickened liquids. ? Thicken soup by pureeing, straining to remove chunks, and adding flour, potato flakes, or corn starch. ? Add commercial thickener to foods that become liquid at room temperature, such as milk shakes, yogurt, ice cream, gelatin, and sherbet.  Ask your health care provider whether you need to take a fiber supplement. Cooking  Cook meats so they stay tender and moist. Use methods like braising, stewing, or baking in liquid.  Cook vegetables and fruit until  they are soft enough to be mashed with a fork.  Peel soft, fresh fruits such as peaches, nectarines, and melons.  When making soup, make sure chunks of meat and vegetables are smaller than  inch.  Reheat leftover foods slowly so that a tough crust does not form. What foods are allowed? The items listed below may not be a complete list. Talk with your dietitian about what dietary choices are best for you. Grains Breads, muffins, pancakes, or waffles moistened with syrup, jelly, or butter. Dry cereals well-moistened with milk. Moist, cooked cereals. Well-cooked pasta and rice. Vegetables All soft-cooked vegetables. Shredded lettuce. Fruits All canned and cooked fruits. Soft, peeled fresh fruits. Strawberries. Dairy Milk. Cream. Yogurt. Cottage cheese. Soft cheese without the rind. Meats and other protein foods Tender, moist ground meat, poultry, or fish. Meat cooked in gravy or sauces. Eggs. Sweets and desserts Ice cream. Milk shakes. Sherbet. Pudding. Fats and oils Butter. Margarine. Olive, canola, sunflower, and grapeseed oil. Smooth salad dressing. Smooth cream cheese. Mayonnaise. Gravy. What foods are not allowed? The items listed bemay not be a complete list. Talk with your dietitian about what dietary choices are best for you. Grains Coarse or dry cereals, such as bran, granola, and shredded wheat. Tough or chewy crusty breads, such as [place] bread or baguettes. Breads with nuts, seeds, or fruit. Vegetables All raw vegetables. Cooked corn. Cooked vegetables that are tough or stringy. Tough, crisp, fried potatoes and potato skins. Fruits Fresh fruits with skins or seeds, or both, such as apples, pears, and grapes. Stringy, high-pulp fruits, such as papaya, pineapple,  coconut, and mango. Fruit leather and all dried fruit. Dairy Yogurt with nuts or coconut. Meats and other protein foods Hard, dry sausages. Dry meat, poultry, or fish. Meats with gristle. Fish with bones. Fried meat  or fish. Lunch meat and hotdogs. Nuts and seeds. Chunky peanut butter or other nut butters. Sweets and desserts Cakes or cookies that are very dry or chewy. Desserts with dried fruit, nuts, or coconut. Fried pastries. Very rich pastries. Fats and oils Cream cheese with fruit or nuts. Salad dressings with seeds or chunks. Summary  A soft-food eating plan includes foods that are safe and easy to swallow. Generally, the foods should be soft enough to be mashed with a fork.  Avoid foods that are dry, hard to chew, crunchy, sticky, stringy, or crispy.  Ask your health care provider whether you need to thicken your liquids and if you need to take a fiber supplement. This information is not intended to replace advice given to you by your health care provider. Make sure you discuss any questions you have with your health care provider. Document Released: 10/06/2007 Document Revised: 10/20/2018 Document Reviewed: 09/01/2016 Elsevier Patient Education  2020 [employer].

## 2019-03-23 NOTE — Progress Notes (Signed)
New Patient Office Visit  Subjective:  Patient ID: Traci Parker, female    DOB: 10/28/1945  Age: 73 y.o. MRN: [id number]  CC:  Chief Complaint  Patient presents with  . New Patient (Initial Visit)  . Anorexia    due to tooth lose  . Insomnia    HPI Traci Parker presents for insomnia  Hematuria-followed by nephrology-last visit 2012-pt did not go to nephrology in [place]  Past Medical History:  Diagnosis Date  . Anemia   . Diabetes mellitus without complication ([place])   . History of blood in urine   . Hypertension   brother -COLON [place]  Past Surgical History:  Procedure Laterality Date  . CHOLECYSTECTOMY  2008  . COLONOSCOPY  04/30/2011   Procedure: COLONOSCOPY;  Surgeon: Rogene Houston, MD;  [facility];  Service: Endoscopy;  Laterality: N/A;  . COLONOSCOPY  05/25/2012   Procedure: COLONOSCOPY;  Surgeon: Rogene Houston, MD;  [facility];  Service: Endoscopy;  Laterality: N/A;  1:25-changed to 1200 Ann to notify pt  . right breast cystectomy  1988  . TUBAL LIGATION  1975    No family history on file.  Social History  Lives [place] nearby, daughter in [employer], sister in [place] History  . Marital status: Divorced    Spouse name: Not on file  . Number of children: Not on file  . Years of education: Not on file  . Highest education level: Not on file  Occupational History  . Not on file  Social Needs  . Financial resource strain: Not on file  . Food insecurity    Worry: Not on file    Inability: Not on file  . Transportation needs    Medical: Not on file    Non-medical: Not on file  Tobacco Use  . Smoking status: Former Smoker    Packs/day: 1.00    Years: 40.00    Pack years: 40.00    Quit date: 04/03/2011    Years since quitting: 7.9  . Smokeless tobacco: Never Used  Substance and Sexual Activity  . Alcohol use: No  . Drug use: No  . Sexual activity: Not Currently  Lifestyle  . Physical  activity    Days per week: Not on file    Minutes per session: Not on file  . Stress: Not on file  Relationships  . Social Herbalist on phone: Not on file    Gets together: Not on file    Attends religious service: Not on file    Active member of club or organization: Not on file    Attends meetings of clubs or organizations: Not on file    Relationship status: Not on file  . Intimate partner violence    Fear of current or ex partner: Not on file    Emotionally abused: Not on file    Physically abused: Not on file    Forced sexual activity: Not on file  Other Topics Concern  . Not on file  Social History Narrative  . Not on file    ROS Review of Systems  HENT: Negative.        Dentures-difficulty with chewing  Eyes:       Glaucoma  Respiratory: Negative.   Cardiovascular: Negative.   Gastrointestinal: Negative.   Endocrine:       Pre-diabetes  Genitourinary: Positive for hematuria.  Musculoskeletal: Negative.   Skin:  eczema  Neurological: Negative.   Hematological: Negative.   Psychiatric/Behavioral: Positive for sleep disturbance.       Wake during the night 6pm-4am    Objective:   Today's Vitals: BP 125/76 (BP Location: Left Arm, Patient Position: Sitting, Cuff Size: Normal)   Pulse 90   Temp 97.9 F (36.6 C) (Oral)   Ht 5\' 4"  (1.626 m)   Wt 147 lb 6.4 oz (66.9 kg)   SpO2 97%   BMI 25.30 kg/m   Physical Exam Vitals signs reviewed.  Constitutional:      Appearance: Normal appearance. She is normal weight.  HENT:     Head: Normocephalic and atraumatic.     Right Ear: Tympanic membrane, ear canal and external ear normal.     Left Ear: Tympanic membrane, ear canal and external ear normal.     Nose: Nose normal.     Mouth/Throat:     Mouth: Mucous membranes are moist.     Comments: No dentures-upper Eyes:     Conjunctiva/sclera: Conjunctivae normal.  Neck:     Musculoskeletal: Normal range of motion and neck supple.   Cardiovascular:     Rate and Rhythm: Normal rate and regular rhythm.     Pulses: Normal pulses.     Heart sounds: Normal heart sounds.  Pulmonary:     Effort: Pulmonary effort is normal.     Breath sounds: Normal breath sounds.  Neurological:     Mental Status: She is alert and oriented to person, place, and time.  Psychiatric:        Mood and Affect: Mood normal.     Assessment & Plan:    Outpatient Encounter Medications as of 03/23/2019  Medication Sig  . atorvastatin (LIPITOR) 10 MG tablet Take 10 mg by mouth daily.  . dorzolamide (TRUSOPT) 2 % ophthalmic solution Place 1 drop into both eyes 2 (two) times daily.  Marland Kitchen latanoprost (XALATAN) 0.005 % ophthalmic solution Place 1 drop into both eyes at bedtime.  Marland Kitchen amLODipine (NORVASC) 5 MG tablet Take 10 mg by mouth daily.   . cyanocobalamin (,VITAMIN B-12,) 1000 MCG/ML injection Inject 1,000 mcg into the muscle every 30 (thirty) days.    . [DISCONTINUED] metFORMIN (GLUCOPHAGE) 500 MG tablet Take 500 mg by mouth daily with breakfast.  . [DISCONTINUED] simvastatin (ZOCOR) 20 MG tablet Take 20 mg by mouth at bedtime.   No facility-administered encounter medications on file as of 03/23/2019.   1. Screening for breast cancer - MM Digital Screening; Future  2. Hyperlipidemia, unspecified hyperlipidemia type lipitor - Lipid panel - Comprehensive Metabolic Panel (CMET)  3. Open-angle glaucoma, unspecified glaucoma stage, unspecified laterality, unspecified open-angle glaucoma type Followed by eye specialist  4. Pre-diabetes - Hemoglobin A1c  5. Hypertension, unspecified type norvas - Microalbumin, urine  6. Vitamin B12 deficiency Inject monthly - Vitamin B12 - CBC with Differential  7. Hematuria of unknown cause nephro in the past - Urine Microscopic Follow-up:   LISA Hannah Beat, MD

## 2019-03-23 NOTE — Addendum Note (Signed)
Addended by: Saintclair Halsted A on: 03/23/2019 10:21 AM   Modules accepted: Orders

## 2019-03-25 LAB — CBC WITH DIFFERENTIAL/PLATELET
Absolute Monocytes: 540 cells/uL (ref 200–950)
Basophils Absolute: 33 cells/uL (ref 0–200)
Basophils Relative: 0.4 %
Eosinophils Absolute: 415 cells/uL (ref 15–500)
Eosinophils Relative: 5 %
HCT: 34.2 % — ABNORMAL LOW (ref 35.0–45.0)
Hemoglobin: 10.1 g/dL — ABNORMAL LOW (ref 11.7–15.5)
Lymphs Abs: 2432 cells/uL (ref 850–3900)
MCH: 20.5 pg — ABNORMAL LOW (ref 27.0–33.0)
MCHC: 29.5 g/dL — ABNORMAL LOW (ref 32.0–36.0)
MCV: 69.5 fL — ABNORMAL LOW (ref 80.0–100.0)
MPV: 10.9 fL (ref 7.5–12.5)
Monocytes Relative: 6.5 %
Neutro Abs: 4880 cells/uL (ref 1500–7800)
Neutrophils Relative %: 58.8 %
Platelets: 280 10*3/uL (ref 140–400)
RBC: 4.92 10*6/uL (ref 3.80–5.10)
RDW: 19.5 % — ABNORMAL HIGH (ref 11.0–15.0)
Total Lymphocyte: 29.3 %
WBC: 8.3 10*3/uL (ref 3.8–10.8)

## 2019-03-25 LAB — COMPREHENSIVE METABOLIC PANEL
AG Ratio: 1.6 (calc) (ref 1.0–2.5)
ALT: 4 U/L — ABNORMAL LOW (ref 6–29)
AST: 7 U/L — ABNORMAL LOW (ref 10–35)
Albumin: 4.3 g/dL (ref 3.6–5.1)
Alkaline phosphatase (APISO): 108 U/L (ref 37–153)
BUN/Creatinine Ratio: 7 (calc) (ref 6–22)
BUN: 10 mg/dL (ref 7–25)
CO2: 28 mmol/L (ref 20–32)
Calcium: 9.3 mg/dL (ref 8.6–10.4)
Chloride: 104 mmol/L (ref 98–110)
Creat: 1.38 mg/dL — ABNORMAL HIGH (ref 0.60–0.93)
Globulin: 2.7 g/dL (calc) (ref 1.9–3.7)
Glucose, Bld: 112 mg/dL — ABNORMAL HIGH (ref 65–99)
Potassium: 4.4 mmol/L (ref 3.5–5.3)
Sodium: 140 mmol/L (ref 135–146)
Total Bilirubin: 0.5 mg/dL (ref 0.2–1.2)
Total Protein: 7 g/dL (ref 6.1–8.1)

## 2019-03-25 LAB — HEMOGLOBIN A1C
Hgb A1c MFr Bld: 5.8 % of total Hgb — ABNORMAL HIGH (ref <5.7)
Mean Plasma Glucose: 120 (calc)
eAG (mmol/L): 6.6 (calc)

## 2019-03-25 LAB — LIPID PANEL
Cholesterol: 149 mg/dL (ref <200)
HDL: 43 mg/dL — ABNORMAL LOW (ref >=50)
LDL Cholesterol (Calc): 87 mg/dL (calc)
Non-HDL Cholesterol (Calc): 106 mg/dL (calc) (ref <130)
Total CHOL/HDL Ratio: 3.5 (calc) (ref <5.0)
Triglycerides: 93 mg/dL (ref <150)

## 2019-03-25 LAB — VITAMIN B12: Vitamin B-12: 1256 pg/mL — ABNORMAL HIGH (ref 200–1100)

## 2019-03-30 ENCOUNTER — Other Ambulatory Visit: Payer: Self-pay | Admitting: Family Medicine

## 2019-03-30 ENCOUNTER — Ambulatory Visit (HOSPITAL_COMMUNITY)
Admission: RE | Admit: 2019-03-30 | Discharge: 2019-03-30 | Disposition: A | Discharge status: Home / Self Care | Payer: Medicare Other | Source: Ambulatory Visit | Attending: Family Medicine | Admitting: Family Medicine

## 2019-03-30 ENCOUNTER — Other Ambulatory Visit: Payer: Self-pay

## 2019-03-30 DIAGNOSIS — Z1239 Encounter for other screening for malignant neoplasm of breast: Secondary | ICD-10-CM

## 2019-03-30 DIAGNOSIS — Z1231 Encounter for screening mammogram for malignant neoplasm of breast: Secondary | ICD-10-CM | POA: Insufficient documentation

## 2019-04-18 LAB — URINE CULTURE
MICRO NUMBER:: 869187
SPECIMEN QUALITY:: ADEQUATE

## 2019-04-18 LAB — URINALYSIS, MICROSCOPIC ONLY: Bacteria, UA: NONE SEEN /HPF

## 2019-04-18 LAB — CLIENT EDUCATION TRACKING

## 2019-04-18 LAB — HOUSE ACCOUNT TRACKING

## 2019-04-24 ENCOUNTER — Other Ambulatory Visit: Payer: Self-pay | Admitting: Family Medicine

## 2019-04-24 ENCOUNTER — Telehealth: Payer: Self-pay | Admitting: Family Medicine

## 2019-04-24 DIAGNOSIS — N289 Disorder of kidney and ureter, unspecified: Secondary | ICD-10-CM

## 2019-04-24 DIAGNOSIS — D649 Anemia, unspecified: Secondary | ICD-10-CM

## 2019-04-24 DIAGNOSIS — H4010X0 Unspecified open-angle glaucoma, stage unspecified: Secondary | ICD-10-CM

## 2019-04-24 DIAGNOSIS — R319 Hematuria, unspecified: Secondary | ICD-10-CM

## 2019-04-24 NOTE — Telephone Encounter (Signed)
Patient requesting call back from [place]. She states she was taken off of medications and referred to another doctor but she is unsure what this is in regards to. Requesting leighann to reach her at [phone]

## 2019-04-24 NOTE — Telephone Encounter (Signed)
Patient is aware of the recommendation 

## 2019-04-24 NOTE — Telephone Encounter (Signed)
Pt was asked to hold on B12 injections-referral made to hematology Pt was previously seen by nephrology-blood noted in her urine when seen in clinic and abnormal renal function-pt had mentioned she previously had a nephrology. Will refer to a new nephrology if pt does not want to see old nephrologist. If pt wants to see her old nephrologist we can cancel this referral Pt mentioned she saw a eye specialist. Have referred pt to a new eye specialist.  If pt does not need a new specialist, we can cancel this referral

## 2019-04-24 NOTE — Telephone Encounter (Signed)
Routing to Dr Holly Bodily for advice, I don't see anything in Dr. Marlowe Kays note saying stop medications or nor do I see a referral for her

## 2019-05-08 ENCOUNTER — Other Ambulatory Visit: Payer: Self-pay

## 2019-05-08 ENCOUNTER — Inpatient Hospital Stay (HOSPITAL_COMMUNITY): Payer: Medicare Other | Attending: Hematology | Admitting: Hematology

## 2019-05-08 ENCOUNTER — Inpatient Hospital Stay (HOSPITAL_COMMUNITY): Payer: Medicare Other

## 2019-05-08 ENCOUNTER — Encounter (HOSPITAL_COMMUNITY): Payer: Self-pay | Admitting: Hematology

## 2019-05-08 DIAGNOSIS — I10 Essential (primary) hypertension: Secondary | ICD-10-CM | POA: Insufficient documentation

## 2019-05-08 DIAGNOSIS — Z79899 Other long term (current) drug therapy: Secondary | ICD-10-CM | POA: Diagnosis not present

## 2019-05-08 DIAGNOSIS — Z809 Family history of malignant neoplasm, unspecified: Secondary | ICD-10-CM | POA: Diagnosis not present

## 2019-05-08 DIAGNOSIS — Z87891 Personal history of nicotine dependence: Secondary | ICD-10-CM | POA: Diagnosis not present

## 2019-05-08 DIAGNOSIS — E119 Type 2 diabetes mellitus without complications: Secondary | ICD-10-CM | POA: Diagnosis not present

## 2019-05-08 DIAGNOSIS — D509 Iron deficiency anemia, unspecified: Secondary | ICD-10-CM | POA: Insufficient documentation

## 2019-05-08 DIAGNOSIS — M810 Age-related osteoporosis without current pathological fracture: Secondary | ICD-10-CM | POA: Diagnosis not present

## 2019-05-08 LAB — CBC WITH DIFFERENTIAL/PLATELET
Abs Immature Granulocytes: 0.04 10*3/uL (ref 0.00–0.07)
Basophils Absolute: 0 10*3/uL (ref 0.0–0.1)
Basophils Relative: 0 %
Eosinophils Absolute: 0.3 10*3/uL (ref 0.0–0.5)
Eosinophils Relative: 3 %
HCT: 35.1 % — ABNORMAL LOW (ref 36.0–46.0)
Hemoglobin: 10.1 g/dL — ABNORMAL LOW (ref 12.0–15.0)
Immature Granulocytes: 0 %
Lymphocytes Relative: 26 %
Lymphs Abs: 2.5 10*3/uL (ref 0.7–4.0)
MCH: 19.6 pg — ABNORMAL LOW (ref 26.0–34.0)
MCHC: 28.8 g/dL — ABNORMAL LOW (ref 30.0–36.0)
MCV: 68 fL — ABNORMAL LOW (ref 80.0–100.0)
Monocytes Absolute: 0.6 10*3/uL (ref 0.1–1.0)
Monocytes Relative: 6 %
Neutro Abs: 6.1 10*3/uL (ref 1.7–7.7)
Neutrophils Relative %: 65 %
Platelets: 275 10*3/uL (ref 150–400)
RBC: 5.16 MIL/uL — ABNORMAL HIGH (ref 3.87–5.11)
RDW: 21 % — ABNORMAL HIGH (ref 11.5–15.5)
WBC: 9.5 10*3/uL (ref 4.0–10.5)
nRBC: 0 % (ref 0.0–0.2)

## 2019-05-08 LAB — COMPREHENSIVE METABOLIC PANEL
ALT: 8 U/L (ref 0–44)
AST: 11 U/L — ABNORMAL LOW (ref 15–41)
Albumin: 4.4 g/dL (ref 3.5–5.0)
Alkaline Phosphatase: 115 U/L (ref 38–126)
Anion gap: 11 (ref 5–15)
BUN: 11 mg/dL (ref 8–23)
CO2: 23 mmol/L (ref 22–32)
Calcium: 9.1 mg/dL (ref 8.9–10.3)
Chloride: 99 mmol/L (ref 98–111)
Creatinine, Ser: 1.26 mg/dL — ABNORMAL HIGH (ref 0.44–1.00)
GFR calc Af Amer: 49 mL/min — ABNORMAL LOW (ref >60)
GFR calc non Af Amer: 42 mL/min — ABNORMAL LOW (ref >60)
Glucose, Bld: 135 mg/dL — ABNORMAL HIGH (ref 70–99)
Potassium: 3.7 mmol/L (ref 3.5–5.1)
Sodium: 133 mmol/L — ABNORMAL LOW (ref 135–145)
Total Bilirubin: 0.5 mg/dL (ref 0.3–1.2)
Total Protein: 7.8 g/dL (ref 6.5–8.1)

## 2019-05-08 LAB — IRON AND TIBC
Iron: 53 ug/dL (ref 28–170)
Saturation Ratios: 24 % (ref 10.4–31.8)
TIBC: 223 ug/dL — ABNORMAL LOW (ref 250–450)
UIBC: 170 ug/dL

## 2019-05-08 LAB — RETICULOCYTES
Immature Retic Fract: 14 % (ref 2.3–15.9)
RBC.: 5.16 MIL/uL — ABNORMAL HIGH (ref 3.87–5.11)
Retic Count, Absolute: 29.9 10*3/uL (ref 19.0–186.0)
Retic Ct Pct: 0.6 % (ref 0.4–3.1)

## 2019-05-08 LAB — LACTATE DEHYDROGENASE: LDH: 103 U/L (ref 98–192)

## 2019-05-08 LAB — FOLATE: Folate: 2.7 ng/mL — ABNORMAL LOW (ref >5.9)

## 2019-05-08 LAB — FERRITIN: Ferritin: 215 ng/mL (ref 11–307)

## 2019-05-08 LAB — VITAMIN B12: Vitamin B-12: 451 pg/mL (ref 180–914)

## 2019-05-08 NOTE — Assessment & Plan Note (Addendum)
1.  Microcytic anemia: -Labs from 03/24/2019 reveal hemoglobin of 10.1, MCV of 69.5.  There are no previous labs to trend. -Today we discussed common causes of microcytic anemia including iron deficiency, thalassemia, and anemia of chronic disease.  -Low MCV is concerning for thalassemia. -We will complete extensive lab work-up to determine underlying etiology of patient's anemia. -Patient return to clinic in 4 weeks.

## 2019-05-08 NOTE — Progress Notes (Signed)
CONSULT NOTE  Patient Care Team: Corum, Rex Kras, MD as PCP - General (Family Medicine)  CHIEF COMPLAINTS/PURPOSE OF CONSULTATION:  Microcytic anemia  HISTORY OF PRESENTING ILLNESS:  Traci Parker 73 y.o. female presents today for consult regarding history of anemia.  She has a past medical history significant for hypertension, hyper lipidemia, vitamin D deficiency, glaucoma, and colon polyps. Her most recent labs are from March 24, 2019 reveal hemoglobin of 10.1, MCV of 69.5, white blood cell within normal at 8.3 with normal differential, platelets 280.  Vitamin B12 elevated at 1256.  Metabolic panel significant for elevated serum creatinine of 1.32.  We have no previous labs to compare hemoglobin to.    Her last colonoscopy was in June 2019, she had 3 polyp adenomas removed.  Her recommendation for repeat colonoscopy is for 3 years.  She states she was diagnosed with pernicious anemia over 10 years ago by hematologist.  She states she has been on vitamin B12 injections for the past 10 years.  She states she has tried nasal mist and pill form of vitamin B12 with no improvement of her anemia.  Her PCP currently wanted her to be worked up again for her anemia.  She has not received a vitamin B12 injections for 2 months now.  Patient reports increasing fatigue.  Denies any shortness of breath or chest pain.  She denies any obvious signs of bleeding.  She states that one time she was on iron supplements but stopped taking them.  She is a current everyday smoker smoking for at least 25 years.  She has family history significant for colon cancer in a half brother and a paternal niece with unknown cancer.  She denies herself having history of a cancer.  Denies any family history of blood disorders.      MEDICAL HISTORY:  Past Medical History:  Diagnosis Date  . Allergy   . Anemia   . Asthma   . Blood transfusion without reported diagnosis   . Diabetes mellitus without complication ([place])   .  Glaucoma   . History of blood in urine   . Hypertension   . Osteoporosis     SURGICAL HISTORY: Past Surgical History:  Procedure Laterality Date  . CHOLECYSTECTOMY  2008  . COLONOSCOPY  04/30/2011   Procedure: COLONOSCOPY;  Surgeon: Rogene Houston, MD;  [facility];  Service: Endoscopy;  Laterality: N/A;  . COLONOSCOPY  05/25/2012   Procedure: COLONOSCOPY;  Surgeon: Rogene Houston, MD;  Location: [facility];  Service: Endoscopy;  Laterality: N/A;  1:25-changed to 1200 Ann to notify pt  . GALLBLADDER SURGERY    . right breast cystectomy  1988  . TUBAL LIGATION  1975    SOCIAL HISTORY: Social History   Socioeconomic History  . Marital status: Divorced    Spouse name: Not on file  . Number of children: 4  . Years of education: Not on file  . Highest education level: Not on file  Occupational History  . Not on file  Social Needs  . Financial resource strain: Not on file  . Food insecurity    Worry: Not on file    Inability: Not on file  . Transportation needs    Medical: Not on file    Non-medical: Not on file  Tobacco Use  . Smoking status: Former Smoker    Packs/day: 1.00    Years: 40.00    Pack years: 40.00    Quit date: 04/03/2011  Years since quitting: 8.1  . Smokeless tobacco: Never Used  Substance and Sexual Activity  . Alcohol use: No  . Drug use: No  . Sexual activity: Not Currently  Lifestyle  . Physical activity    Days per week: Not on file    Minutes per session: Not on file  . Stress: Not on file  Relationships  . Social Herbalist on phone: Not on file    Gets together: Not on file    Attends religious service: Not on file    Active member of club or organization: Not on file    Attends meetings of clubs or organizations: Not on file    Relationship status: Not on file  . Intimate partner violence    Fear of current or ex partner: Not on file    Emotionally abused: Not on file    Physically abused: Not on file     Forced sexual activity: Not on file  Other Topics Concern  . Not on file  Social History Narrative  . Not on file    FAMILY HISTORY: Family History  Problem Relation Age of Onset  . Diabetes Mother   . Hyperlipidemia Mother   . Hypertension Mother   . Heart disease Father   . Hyperlipidemia Father   . Diabetes Sister   . Hyperlipidemia Sister   . Hypertension Sister   . Cancer Brother   . Heart disease Brother   . Hyperlipidemia Brother   . Hypertension Brother   . Diabetes Sister     ALLERGIES:  is allergic to latex; neomycin-bacitracin-polymyxin  [bacitracin-neomycin-polymyxin]; other; fish allergy; neosporin [neomycin-polymyxin-gramicidin]; and prednisone.  MEDICATIONS:  Current Outpatient Medications  Medication Sig Dispense Refill  . amLODipine (NORVASC) 5 MG tablet Take 10 mg by mouth daily.     Marland Kitchen atorvastatin (LIPITOR) 10 MG tablet Take 10 mg by mouth daily.    . calcium-vitamin D (OSCAL WITH D) 500-200 MG-UNIT tablet Take 1 tablet by mouth daily.    . cyanocobalamin (,VITAMIN B-12,) 1000 MCG/ML injection Inject 1,000 mcg into the muscle every 30 (thirty) days.      . dorzolamide (TRUSOPT) 2 % ophthalmic solution Place 1 drop into both eyes 2 (two) times daily.    Marland Kitchen latanoprost (XALATAN) 0.005 % ophthalmic solution Place 1 drop into both eyes at bedtime.     No current facility-administered medications for this visit.     REVIEW OF SYSTEMS:   Constitutional: Denies fevers, chills or abnormal night sweats Eyes: Denies blurriness of vision, double vision or watery eyes Ears, nose, mouth, throat, and face: Denies mucositis or sore throat Respiratory: Denies cough, dyspnea or wheezes Cardiovascular: Denies palpitation, chest discomfort or lower extremity swelling Gastrointestinal:  Denies nausea, heartburn or change in bowel habits Skin: Denies abnormal skin rashes Lymphatics: Denies new lymphadenopathy or easy bruising Neurological:Denies numbness, tingling or  new weaknesses Behavioral/Psych: Mood is stable, no new changes  All other systems were reviewed with the patient and are negative.  PHYSICAL EXAMINATION: ECOG PERFORMANCE STATUS: 0 - Asymptomatic  Vitals:   05/08/19 1308  BP: (!) 141/79  Pulse: 100  Resp: 18  Temp: 97.9 F (36.6 C)  SpO2: 100%   Filed Weights   05/08/19 1308  Weight: 147 lb 14.4 oz (67.1 kg)    GENERAL:alert, no distress and comfortable SKIN: skin color, texture, turgor are normal, no rashes or significant lesions EYES: normal, conjunctiva are pink and non-injected, sclera clear OROPHARYNX:no exudate, no erythema and lips,  buccal mucosa, and tongue normal  NECK: supple, thyroid normal size, non-tender, without nodularity LYMPH:  no palpable lymphadenopathy in the cervical, axillary or inguinal LUNGS: clear to auscultation and percussion with normal breathing effort HEART: regular rate & rhythm and no murmurs and no lower extremity edema ABDOMEN:abdomen soft, non-tender and normal bowel sounds Musculoskeletal:no cyanosis of digits and no clubbing  PSYCH: alert & oriented x 3 with fluent speech NEURO: no focal motor/sensory deficits  LABORATORY DATA:  I have reviewed the data as listed Recent Results (from the past 2160 hour(s))  Urine Microscopic     Status: Abnormal   Collection Time: 03/23/19 12:00 AM  Result Value Ref Range   WBC, UA 0-5 0 - 5 /HPF   RBC / HPF 0-2 0 - 2 /HPF   Squamous Epithelial / LPF 0-5 < OR = 5 /HPF   Bacteria, UA NONE SEEN NONE SEEN /HPF   Hyaline Cast 0-1 (A) NONE SEEN /LPF  House Account Tracking only     Status: None   Collection Time: 03/23/19 12:00 AM  Result Value Ref Range   Tracking House Account      Comment: We were unable to identify an account number for the order submitted. If you do not have a [facility]  account number or if your account information needs  to be updated please call [phone] for assistance. . To prevent delays in  testing and processing of your orders please provide the following information for this order and with every additional order submitted: [facility] account number and account name  Client address Client phone and fax number NPI number of ordering physician along with the physician name.   Client Education Tracking     Status: None   Collection Time: 03/23/19 12:00 AM  Result Value Ref Range   Client Education Tracking      Comment: The Requisition we received did not include a [facility] account number. To prevent delays in  testing and processing of your orders please provide the following information with every order submitted: [facility] account number and account name Client address Client phone and fax number NPI number of ordering physician along with the  physician name.   Urine Culture     Status: Abnormal   Collection Time: 03/23/19 12:00 AM  Result Value Ref Range   MICRO NUMBER: [id number]    SPECIMEN QUALITY: Adequate    Sample Source URINE    STATUS: FINAL    ISOLATE 1: Streptococcus agalactiae (A)     Comment: 50,000-100,000 CFU/mL of Group B Streptococcus isolated Beta-hemolytic Streptococci are predictably susceptible to penicillin and other beta-lactams. Susceptibility testing not routinely performed. Erythromycin and clindamycin are not recommended for  treatment of urinary tract infections, but clindamycin may be useful for treatment of rectovaginal colonization or infection.    COMMENT:      We received a preserved urine culture transport tube and performed a urine culture. If this is not what you intended to order, please contact your local client service representative immediately so that we can adjust our billing appropriately. You may  also inquire about alternative or additional testing.   POCT URINALYSIS DIP (CLINITEK)     Status: Abnormal   Collection Time: 03/23/19 10:15 AM  Result Value Ref Range   Color, UA other (A) yellow   Clarity, UA turbid (A)  clear   Glucose, UA negative negative mg/dL   Bilirubin, UA negative negative   Ketones, POC UA negative negative mg/dL  Spec Grav, UA 1.025 1.010 - 1.025   Blood, UA small (A) negative   pH, UA 5.5 5.0 - 8.0   POC PROTEIN,UA =30 (A) negative, trace   Urobilinogen, UA 1.0 0.2 or 1.0 E.U./dL   Nitrite, UA Negative Negative   Leukocytes, UA Small (1+) (A) Negative  POCT UA - Microalbumin     Status: Abnormal   Collection Time: 03/23/19 10:15 AM  Result Value Ref Range   Microalbumin Ur, POC 150 mg/l mg/L   Creatinine, POC 300 mg/dl mg/dL   A1c 30-300 mg/g   Lipid panel     Status: Abnormal   Collection Time: 03/24/19 10:49 AM  Result Value Ref Range   Cholesterol 149 <200 mg/dL   HDL 43 (L) > OR = 50 mg/dL   Triglycerides 93 <150 mg/dL   LDL Cholesterol (Calc) 87 mg/dL (calc)    Comment: Reference range: <100 . Desirable range <100 mg/dL for primary prevention;   <70 mg/dL for patients with CHD or diabetic patients  with > or = 2 CHD risk factors. Marland Kitchen LDL-C is now calculated using the Martin-Hopkins  calculation, which is a validated novel method providing  better accuracy than the Friedewald equation in the  estimation of LDL-C.  Cresenciano Genre et al. Annamaria Helling. [id number]): 2061-2068  (http://education.[facility].com/faq/FAQ164)    Total CHOL/HDL Ratio 3.5 <5.0 (calc)   Non-HDL Cholesterol (Calc) 106 <130 mg/dL (calc)    Comment: For patients with diabetes plus 1 major ASCVD risk  factor, treating to a non-HDL-C goal of <100 mg/dL  (LDL-C of <70 mg/dL) is considered a therapeutic  option.   Comprehensive Metabolic Panel (CMET)     Status: Abnormal   Collection Time: 03/24/19 10:49 AM  Result Value Ref Range   Glucose, Bld 112 (H) 65 - 99 mg/dL    Comment: .            Fasting reference interval . For someone without known diabetes, a glucose value between 100 and 125 mg/dL is consistent with prediabetes and should be confirmed with a follow-up test. .    BUN 10 7  - 25 mg/dL   Creat 1.38 (H) 0.60 - 0.93 mg/dL    Comment: For patients >55 years of age, the reference limit for Creatinine is approximately 13% higher for people identified as African-American. .    BUN/Creatinine Ratio 7 6 - 22 (calc)   Sodium 140 135 - 146 mmol/L   Potassium 4.4 3.5 - 5.3 mmol/L   Chloride 104 98 - 110 mmol/L   CO2 28 20 - 32 mmol/L   Calcium 9.3 8.6 - 10.4 mg/dL   Total Protein 7.0 6.1 - 8.1 g/dL   Albumin 4.3 3.6 - 5.1 g/dL   Globulin 2.7 1.9 - 3.7 g/dL (calc)   AG Ratio 1.6 1.0 - 2.5 (calc)   Total Bilirubin 0.5 0.2 - 1.2 mg/dL   Alkaline phosphatase (APISO) 108 37 - 153 U/L   AST 7 (L) 10 - 35 U/L   ALT 4 (L) 6 - 29 U/L  Vitamin B12     Status: Abnormal   Collection Time: 03/24/19 10:49 AM  Result Value Ref Range   Vitamin B-12 1,256 (H) 200 - 1,100 pg/mL  Hemoglobin A1c     Status: Abnormal   Collection Time: 03/24/19 10:49 AM  Result Value Ref Range   Hgb A1c MFr Bld 5.8 (H) <5.7 % of total Hgb    Comment: For someone without known diabetes, a hemoglobin  A1c value  between 5.7% and 6.4% is consistent with prediabetes and should be confirmed with a  follow-up test. . For someone with known diabetes, a value <7% indicates that their diabetes is well controlled. A1c targets should be individualized based on duration of diabetes, age, comorbid conditions, and other considerations. . This assay result is consistent with an increased risk of diabetes. . Currently, no consensus exists regarding use of hemoglobin A1c for diagnosis of diabetes for children. .    Mean Plasma Glucose 120 (calc)   eAG (mmol/L) 6.6 (calc)  CBC with Differential/Platelet     Status: Abnormal   Collection Time: 03/24/19 10:49 AM  Result Value Ref Range   WBC 8.3 3.8 - 10.8 Thousand/uL   RBC 4.92 3.80 - 5.10 Million/uL   Hemoglobin 10.1 (L) 11.7 - 15.5 g/dL   HCT 34.2 (L) 35.0 - 45.0 %   MCV 69.5 (L) 80.0 - 100.0 fL   MCH 20.5 (L) 27.0 - 33.0 pg   MCHC 29.5 (L) 32.0  - 36.0 g/dL   RDW 19.5 (H) 11.0 - 15.0 %   Platelets 280 140 - 400 Thousand/uL   MPV 10.9 7.5 - 12.5 fL   Neutro Abs 4,880 1,500 - 7,800 cells/uL   Lymphs Abs 2,432 850 - 3,900 cells/uL   Absolute Monocytes 540 200 - 950 cells/uL   Eosinophils Absolute 415 15 - 500 cells/uL   Basophils Absolute 33 0 - 200 cells/uL   Neutrophils Relative % 58.8 %   Total Lymphocyte 29.3 %   Monocytes Relative 6.5 %   Eosinophils Relative 5.0 %   Basophils Relative 0.4 %   Smear Review      Comment: Review of peripheral smear confirms automated results.      ASSESSMENT & PLAN:  Microcytic anemia 1.  Microcytic anemia: -Labs from 03/24/2019 reveal hemoglobin of 10.1, MCV of 69.5.  There are no previous labs to trend. -Today we discussed common causes of microcytic anemia including iron deficiency, thalassemia, and anemia of chronic disease.  -Low MCV is concerning for thalassemia. -We will complete extensive lab work-up to determine underlying etiology of patient's anemia. -Patient return to clinic in 4 weeks.   Addendum: I have independently elicited history and examined the patient and agree with the above assessment and plan.  She has severe microcytosis, which is discordant with the level of hemoglobin.  This is consistent with thalassemia trait.  However we will check for nutritional deficiencies today.  Her anemia is also likely secondary to chronic kidney disease.  She gives a history of transfusion about 10 years ago.  Her last colonoscopy was done in [facility] in June 2019 when 3 polyps were removed.  She also reported 10 to 15 pound weight loss in the last 6 months and attributes to decreased appetite.  Her B12 injections were reportedly stopped 2 months ago.  She was told to resume her B12 injections as she is complaining of severe tiredness.  Once her iron levels are adequately repleted, will consider checking hemoglobin electrophoresis.   All questions were answered. The  patient knows to call the clinic with any problems, questions or concerns. I spent 30 minutes counseling the patient face to face. The total time spent in the appointment was 55 minutes and more than 50% was on counseling.

## 2019-05-08 NOTE — Patient Instructions (Signed)
[place] at [facility] Discharge Instructions  You were seen today by Dr. Delton Coombes. He went over your history, family history and how you've been feeling latley. He will have blood work drawn today. He will see you back in 3 weeks for follow up.   Thank you for choosing [place] at [facility] to provide your oncology and hematology care.  To afford each patient quality time with our provider, please arrive at least 15 minutes before your scheduled appointment time.   If you have a lab appointment with the [facility] please come in thru the  Main Entrance and check in at the main information desk  You need to re-schedule your appointment should you arrive 10 or more minutes late.  We strive to give you quality time with our providers, and arriving late affects you and other patients whose appointments are after yours.  Also, if you no show three or more times for appointments you may be dismissed from the clinic at the providers discretion.     Again, thank you for choosing [facility].  Our hope is that these requests will decrease the amount of time that you wait before being seen by our physicians.       _____________________________________________________________  Should you have questions after your visit to [facility], please contact our office at [phone] between the hours of 8:00 a.m. and 4:30 p.m.  Voicemails left after 4:00 p.m. will not be returned until the following business day.  For prescription refill requests, have your pharmacy contact our office and allow 72 hours.    Cancer Center Support Programs:   > Cancer Support Group  2nd Tuesday of the month 1pm-2pm, Journey Room

## 2019-05-09 LAB — PROTEIN ELECTROPHORESIS, SERUM
A/G Ratio: 1.3 (ref 0.7–1.7)
Albumin ELP: 4.1 g/dL (ref 2.9–4.4)
Alpha-1-Globulin: 0.2 g/dL (ref 0.0–0.4)
Alpha-2-Globulin: 0.8 g/dL (ref 0.4–1.0)
Beta Globulin: 1 g/dL (ref 0.7–1.3)
Gamma Globulin: 1.1 g/dL (ref 0.4–1.8)
Globulin, Total: 3.1 g/dL (ref 2.2–3.9)
Total Protein ELP: 7.2 g/dL (ref 6.0–8.5)

## 2019-05-10 LAB — COPPER, SERUM: Copper: 102 ug/dL (ref 72–166)

## 2019-05-12 ENCOUNTER — Other Ambulatory Visit: Payer: Self-pay

## 2019-05-12 ENCOUNTER — Encounter (HOSPITAL_COMMUNITY): Payer: Self-pay | Admitting: Hematology

## 2019-05-12 DIAGNOSIS — Z20822 Contact with and (suspected) exposure to covid-19: Secondary | ICD-10-CM

## 2019-05-12 LAB — METHYLMALONIC ACID, SERUM: Methylmalonic Acid, Quantitative: 163 nmol/L (ref 0–378)

## 2019-05-14 ENCOUNTER — Telehealth: Payer: Self-pay

## 2019-05-14 LAB — NOVEL CORONAVIRUS, NAA: SARS-CoV-2, NAA: NOT DETECTED

## 2019-05-14 NOTE — Telephone Encounter (Signed)
Patient called in requesting [place] lab results - DOB/Address verified - results pending, reviewed testing process with patient, no further questions.

## 2019-05-15 ENCOUNTER — Telehealth: Payer: Self-pay | Admitting: General Practice

## 2019-05-15 NOTE — Telephone Encounter (Signed)
Negative COVID results given. Patient results "NOT Detected." Caller expressed understanding. ° °

## 2019-05-26 ENCOUNTER — Other Ambulatory Visit: Payer: Self-pay

## 2019-05-29 ENCOUNTER — Inpatient Hospital Stay (HOSPITAL_COMMUNITY): Payer: Medicare Other | Attending: Hematology | Admitting: Hematology

## 2019-05-29 ENCOUNTER — Encounter (HOSPITAL_COMMUNITY): Payer: Self-pay | Admitting: Hematology

## 2019-05-29 ENCOUNTER — Other Ambulatory Visit: Payer: Self-pay

## 2019-05-29 VITALS — BP 145/73 | HR 95 | Temp 97.9°F | Resp 18 | Wt 146.9 lb

## 2019-05-29 DIAGNOSIS — N189 Chronic kidney disease, unspecified: Secondary | ICD-10-CM | POA: Diagnosis not present

## 2019-05-29 DIAGNOSIS — Z809 Family history of malignant neoplasm, unspecified: Secondary | ICD-10-CM | POA: Diagnosis not present

## 2019-05-29 DIAGNOSIS — D509 Iron deficiency anemia, unspecified: Secondary | ICD-10-CM | POA: Diagnosis not present

## 2019-05-29 DIAGNOSIS — J45909 Unspecified asthma, uncomplicated: Secondary | ICD-10-CM | POA: Diagnosis not present

## 2019-05-29 DIAGNOSIS — Z833 Family history of diabetes mellitus: Secondary | ICD-10-CM | POA: Insufficient documentation

## 2019-05-29 DIAGNOSIS — Z8249 Family history of ischemic heart disease and other diseases of the circulatory system: Secondary | ICD-10-CM | POA: Diagnosis not present

## 2019-05-29 DIAGNOSIS — E119 Type 2 diabetes mellitus without complications: Secondary | ICD-10-CM | POA: Diagnosis not present

## 2019-05-29 DIAGNOSIS — E538 Deficiency of other specified B group vitamins: Secondary | ICD-10-CM | POA: Diagnosis not present

## 2019-05-29 DIAGNOSIS — Z87891 Personal history of nicotine dependence: Secondary | ICD-10-CM | POA: Diagnosis not present

## 2019-05-29 DIAGNOSIS — I10 Essential (primary) hypertension: Secondary | ICD-10-CM | POA: Insufficient documentation

## 2019-05-29 DIAGNOSIS — Z79899 Other long term (current) drug therapy: Secondary | ICD-10-CM | POA: Insufficient documentation

## 2019-05-29 DIAGNOSIS — K5900 Constipation, unspecified: Secondary | ICD-10-CM | POA: Diagnosis not present

## 2019-05-29 LAB — OCCULT BLOOD X 1 CARD TO LAB, STOOL
Fecal Occult Bld: NEGATIVE
Fecal Occult Bld: NEGATIVE
Fecal Occult Bld: NEGATIVE

## 2019-05-29 NOTE — Patient Instructions (Signed)
[place] at [facility] Discharge Instructions  You were seen today by Dr. Delton Coombes. He went over your recent lab results. He would like you to start taking 1mg  of folic acid daily, you can get this over the counter at your drug store. He will see you back in 3 months for labs and follow up.   Thank you for choosing [place] at [facility] to provide your oncology and hematology care.  To afford each patient quality time with our provider, please arrive at least 15 minutes before your scheduled appointment time.   If you have a lab appointment with the [facility] please come in thru the  Main Entrance and check in at the main information desk  You need to re-schedule your appointment should you arrive 10 or more minutes late.  We strive to give you quality time with our providers, and arriving late affects you and other patients whose appointments are after yours.  Also, if you no show three or more times for appointments you may be dismissed from the clinic at the providers discretion.     Again, thank you for choosing [facility].  Our hope is that these requests will decrease the amount of time that you wait before being seen by our physicians.       _____________________________________________________________  Should you have questions after your visit to [facility], please contact our office at [phone] between the hours of 8:00 a.m. and 4:30 p.m.  Voicemails left after 4:00 p.m. will not be returned until the following business day.  For prescription refill requests, have your pharmacy contact our office and allow 72 hours.    Cancer Center Support Programs:   > Cancer Support Group  2nd Tuesday of the month 1pm-2pm, Journey Room

## 2019-05-29 NOTE — Progress Notes (Signed)
[place], [place] [address]   CLINIC:  Medical Oncology/Hematology  PCP:  Maryruth Hancock, [place] [address] [phone]   REASON FOR VISIT:  Follow-up for microcytic anemia.  CURRENT THERAPY: B12 injections monthly and folic acid tablet daily.   INTERVAL HISTORY:  Traci Parker 73 y.o. female seen for follow-up of microcytic anemia.  Denies any bleeding per rectum or melena.  Last colonoscopy was in 2019.  Reports some sleep problems.  Appetite is 50%.  Energy levels are 75%.  Energy levels have improved since she started back on B12 injections.  Has taken iron pills in the past but cause constipation.  Denies any fevers, night sweats or weight loss recently.    REVIEW OF SYSTEMS:  Review of Systems - Oncology   PAST MEDICAL/SURGICAL HISTORY:  Past Medical History:  Diagnosis Date  . Allergy   . Anemia   . Asthma   . Blood transfusion without reported diagnosis   . Diabetes mellitus without complication ([place])   . Glaucoma   . History of blood in urine   . Hypertension   . Osteoporosis    Past Surgical History:  Procedure Laterality Date  . CHOLECYSTECTOMY  2008  . COLONOSCOPY  04/30/2011   Procedure: COLONOSCOPY;  Surgeon: Rogene Houston, MD;  [facility];  Service: Endoscopy;  Laterality: N/A;  . COLONOSCOPY  05/25/2012   Procedure: COLONOSCOPY;  Surgeon: Rogene Houston, MD;  [facility];  Service: Endoscopy;  Laterality: N/A;  1:25-changed to 1200 Ann to notify pt  . COLONOSCOPY Bilateral 12/2017  . GALLBLADDER SURGERY    . right breast cystectomy  1988  . TUBAL LIGATION  1975     SOCIAL HISTORY:  Social History   Socioeconomic History  . Marital status: Divorced    Spouse name: Not on file  . Number of children: 4  . Years of education: Not on file  . Highest education level: Not on file  Occupational History  . Not on file  Social Needs  . Financial resource strain:  Not on file  . Food insecurity    Worry: Not on file    Inability: Not on file  . Transportation needs    Medical: Not on file    Non-medical: Not on file  Tobacco Use  . Smoking status: Former Smoker    Packs/day: 1.00    Years: 40.00    Pack years: 40.00    Quit date: 04/03/2011    Years since quitting: 8.1  . Smokeless tobacco: Never Used  Substance and Sexual Activity  . Alcohol use: No  . Drug use: No  . Sexual activity: Not Currently  Lifestyle  . Physical activity    Days per week: Not on file    Minutes per session: Not on file  . Stress: Not on file  Relationships  . Social Herbalist on phone: Not on file    Gets together: Not on file    Attends religious service: Not on file    Active member of club or organization: Not on file    Attends meetings of clubs or organizations: Not on file    Relationship status: Not on file  . Intimate partner violence    Fear of current or ex partner: Not on file    Emotionally abused: Not on file    Physically abused: Not on file  Forced sexual activity: Not on file  Other Topics Concern  . Not on file  Social History Narrative  . Not on file    FAMILY HISTORY:  Family History  Problem Relation Age of Onset  . Diabetes Mother   . Hyperlipidemia Mother   . Hypertension Mother   . Heart disease Father   . Hyperlipidemia Father   . Diabetes Sister   . Hyperlipidemia Sister   . Hypertension Sister   . Cancer Brother   . Heart disease Brother   . Hyperlipidemia Brother   . Hypertension Brother   . Diabetes Sister     CURRENT MEDICATIONS:  Outpatient Encounter Medications as of 05/29/2019  Medication Sig  . amLODipine (NORVASC) 5 MG tablet Take 10 mg by mouth daily.   Marland Kitchen atorvastatin (LIPITOR) 10 MG tablet Take 10 mg by mouth daily.  . calcium-vitamin D (OSCAL WITH D) 500-200 MG-UNIT tablet Take 1 tablet by mouth daily.  . cyanocobalamin (,VITAMIN B-12,) 1000 MCG/ML injection Inject 1,000 mcg into the  muscle every 30 (thirty) days.    . dorzolamide (TRUSOPT) 2 % ophthalmic solution Place 1 drop into both eyes 2 (two) times daily.  Marland Kitchen latanoprost (XALATAN) 0.005 % ophthalmic solution Place 1 drop into both eyes at bedtime.   No facility-administered encounter medications on file as of 05/29/2019.     ALLERGIES:  Allergies  Allergen Reactions  . Latex Rash  . Neomycin-Bacitracin-Polymyxin  [Bacitracin-Neomycin-Polymyxin] Rash  . Other Rash  . Fish Allergy     rash  . Neosporin [Neomycin-Polymyxin-Gramicidin] Itching and Rash  . Prednisone Hives, Itching and Rash     PHYSICAL EXAM:  ECOG Performance status: 1  Vitals:   05/29/19 1516  BP: (!) 145/73  Pulse: 95  Resp: 18  Temp: 97.9 F (36.6 C)  SpO2: 99%   Filed Weights   05/29/19 1516  Weight: 146 lb 14.4 oz (66.6 kg)    Physical Exam Vitals signs reviewed.  Constitutional:      Appearance: Normal appearance.  Cardiovascular:     Rate and Rhythm: Normal rate and regular rhythm.     Heart sounds: Normal heart sounds.  Pulmonary:     Effort: Pulmonary effort is normal.     Breath sounds: Normal breath sounds.  Abdominal:     General: There is no distension.     Palpations: Abdomen is soft. There is no mass.  Skin:    General: Skin is warm.  Neurological:     General: No focal deficit present.     Mental Status: She is alert and oriented to person, place, and time.  Psychiatric:        Mood and Affect: Mood normal.        Behavior: Behavior normal.      LABORATORY DATA:  I have reviewed the labs as listed.  CBC    Component Value Date/Time   WBC 9.5 05/08/2019 1407   RBC 5.16 (H) 05/08/2019 1407   RBC 5.16 (H) 05/08/2019 1407   HGB 10.1 (L) 05/08/2019 1407   HCT 35.1 (L) 05/08/2019 1407   PLT 275 05/08/2019 1407   MCV 68.0 (L) 05/08/2019 1407   MCH 19.6 (L) 05/08/2019 1407   MCHC 28.8 (L) 05/08/2019 1407   RDW 21.0 (H) 05/08/2019 1407   LYMPHSABS 2.5 05/08/2019 1407   MONOABS 0.6 05/08/2019  1407   EOSABS 0.3 05/08/2019 1407   BASOSABS 0.0 05/08/2019 1407   CMP Latest Ref Rng & Units 05/08/2019 03/24/2019 08/31/2018  Glucose 70 - 99 mg/dL 135(H) 112(H) -  BUN 8 - 23 mg/dL 11 10 -  Creatinine 0.44 - 1.00 mg/dL 1.26(H) 1.38(H) 1.4(A)  Sodium 135 - 145 mmol/L 133(L) 140 -  Potassium 3.5 - 5.1 mmol/L 3.7 4.4 -  Chloride 98 - 111 mmol/L 99 104 -  CO2 22 - 32 mmol/L 23 28 -  Calcium 8.9 - 10.3 mg/dL 9.1 9.3 -  Total Protein 6.5 - 8.1 g/dL 7.8 7.0 -  Total Bilirubin 0.3 - 1.2 mg/dL 0.5 0.5 -  Alkaline Phos 38 - 126 U/L 115 - -  AST 15 - 41 U/L 11(L) 7(L) 10(A)  ALT 0 - 44 U/L 8 4(L) 7       DIAGNOSTIC IMAGING:  I have independently reviewed the scans and discussed with the patient.    ASSESSMENT & PLAN:   Microcytic anemia 1.  Microcytic anemia: -Last colonoscopy was in June 2019 in [place], 3 polyps removed. -CBC on 05/08/2019 shows hemoglobin 10.1 with MCV of 68.  Ferritin was 215 and percent saturation was 24. -SPEP was negative.  B12 and copper levels were normal. -Etiology is from combination of CKD, relative iron deficiency and folate deficiency. -Folic acid was low at 2.7.  She was told to take folic acid 1 mg daily. -She will continue vitamin B12 injections.  Her energy levels have improved since she started back on B12. -I will see her back in 3 months for follow-up with repeat blood work.      Orders placed this encounter:  Orders Placed This Encounter  Procedures  . Occult blood x 1 card to lab, stool  . Occult blood x 1 card to lab, stool  . Occult blood x 1 card to lab, stool  . CBC with Differential/Platelet  . Comprehensive metabolic panel  . Iron and TIBC  . Ferritin  . Vitamin B12  . Folate      Derek Jack, MD [place] [phone]

## 2019-05-29 NOTE — Progress Notes (Signed)
Error

## 2019-05-29 NOTE — Assessment & Plan Note (Addendum)
1.  Microcytic anemia: -Last colonoscopy was in June 2019 in [place], 3 polyps removed. -CBC on 05/08/2019 shows hemoglobin 10.1 with MCV of 68.  Ferritin was 215 and percent saturation was 24. -SPEP was negative.  B12 and copper levels were normal. -Etiology is from combination of CKD, relative iron deficiency and folate deficiency. -Folic acid was low at 2.7.  She was told to take folic acid 1 mg daily.  She has taken iron pills in the past but caused constipation. -She will continue vitamin B12 injections.  Her energy levels have improved since she started back on B12. -I will see her back in 3 months for follow-up with repeat blood work.

## 2019-05-30 ENCOUNTER — Telehealth: Payer: Self-pay | Admitting: Family Medicine

## 2019-05-30 ENCOUNTER — Telehealth: Payer: Self-pay

## 2019-05-30 ENCOUNTER — Other Ambulatory Visit (HOSPITAL_COMMUNITY): Payer: Self-pay | Admitting: *Deleted

## 2019-05-30 MED ORDER — ATORVASTATIN CALCIUM 10 MG PO TABS: 10.0000 mg | ORAL_TABLET | Freq: Every day | ORAL | 0 refills | Status: DC

## 2019-05-30 MED ORDER — CYANOCOBALAMIN 1000 MCG/ML IJ SOLN: 1000.0000 ug | INTRAMUSCULAR | 0 refills | Status: DC

## 2019-05-30 MED ORDER — AMLODIPINE BESYLATE 5 MG PO TABS: 10.0000 mg | ORAL_TABLET | Freq: Every day | ORAL | 2 refills | Status: DC

## 2019-05-30 NOTE — Telephone Encounter (Signed)
Done

## 2019-05-30 NOTE — Telephone Encounter (Signed)
LeighAnn Ryane Konieczny, CMA  

## 2019-05-30 NOTE — Telephone Encounter (Signed)
Patient is requesting a refill on the following medications.  amLODipine (NORVASC) 5 MG tablet atorvastatin (LIPITOR) 10 MG tablet  cyanocobalamin (,VITAMIN B-12,) 1000 MCG/ML injection  [facility], [place] [facility] [place] [address] [place] (Phone) [phone] (Fax)

## 2019-05-31 MED ORDER — FOLIC ACID 1 MG PO TABS: 1.0000 mg | ORAL_TABLET | Freq: Every day | ORAL | 6 refills | Status: DC

## 2019-08-11 ENCOUNTER — Other Ambulatory Visit (HOSPITAL_COMMUNITY): Payer: Self-pay | Admitting: Nephrology

## 2019-08-11 ENCOUNTER — Other Ambulatory Visit: Payer: Self-pay | Admitting: Nephrology

## 2019-08-11 DIAGNOSIS — N189 Chronic kidney disease, unspecified: Secondary | ICD-10-CM

## 2019-08-22 ENCOUNTER — Other Ambulatory Visit (HOSPITAL_COMMUNITY): Payer: Medicare Other

## 2019-08-22 ENCOUNTER — Other Ambulatory Visit (HOSPITAL_COMMUNITY): Payer: Self-pay | Admitting: *Deleted

## 2019-08-22 ENCOUNTER — Ambulatory Visit (HOSPITAL_COMMUNITY): Payer: Medicare Other

## 2019-08-22 ENCOUNTER — Inpatient Hospital Stay (HOSPITAL_COMMUNITY): Payer: Medicare Other | Attending: Hematology

## 2019-08-22 ENCOUNTER — Other Ambulatory Visit: Payer: Self-pay

## 2019-08-22 ENCOUNTER — Ambulatory Visit (HOSPITAL_COMMUNITY)
Admission: RE | Admit: 2019-08-22 | Discharge: 2019-08-22 | Disposition: A | Discharge status: Home / Self Care | Payer: Medicare Other | Source: Ambulatory Visit | Attending: Nephrology | Admitting: Nephrology

## 2019-08-22 DIAGNOSIS — Z833 Family history of diabetes mellitus: Secondary | ICD-10-CM | POA: Insufficient documentation

## 2019-08-22 DIAGNOSIS — I10 Essential (primary) hypertension: Secondary | ICD-10-CM | POA: Diagnosis not present

## 2019-08-22 DIAGNOSIS — Z8249 Family history of ischemic heart disease and other diseases of the circulatory system: Secondary | ICD-10-CM | POA: Diagnosis not present

## 2019-08-22 DIAGNOSIS — E538 Deficiency of other specified B group vitamins: Secondary | ICD-10-CM | POA: Insufficient documentation

## 2019-08-22 DIAGNOSIS — E119 Type 2 diabetes mellitus without complications: Secondary | ICD-10-CM | POA: Diagnosis not present

## 2019-08-22 DIAGNOSIS — N189 Chronic kidney disease, unspecified: Secondary | ICD-10-CM | POA: Insufficient documentation

## 2019-08-22 DIAGNOSIS — J45909 Unspecified asthma, uncomplicated: Secondary | ICD-10-CM | POA: Insufficient documentation

## 2019-08-22 DIAGNOSIS — D509 Iron deficiency anemia, unspecified: Secondary | ICD-10-CM

## 2019-08-22 DIAGNOSIS — Z79899 Other long term (current) drug therapy: Secondary | ICD-10-CM | POA: Diagnosis not present

## 2019-08-22 DIAGNOSIS — Z809 Family history of malignant neoplasm, unspecified: Secondary | ICD-10-CM | POA: Diagnosis not present

## 2019-08-22 DIAGNOSIS — Z87891 Personal history of nicotine dependence: Secondary | ICD-10-CM | POA: Insufficient documentation

## 2019-08-22 DIAGNOSIS — N183 Chronic kidney disease, stage 3 unspecified: Secondary | ICD-10-CM | POA: Diagnosis not present

## 2019-08-22 LAB — CBC WITH DIFFERENTIAL/PLATELET
Abs Immature Granulocytes: 0.03 10*3/uL (ref 0.00–0.07)
Basophils Absolute: 0 10*3/uL (ref 0.0–0.1)
Basophils Relative: 1 %
Eosinophils Absolute: 0.3 10*3/uL (ref 0.0–0.5)
Eosinophils Relative: 4 %
HCT: 31.4 % — ABNORMAL LOW (ref 36.0–46.0)
Hemoglobin: 9.3 g/dL — ABNORMAL LOW (ref 12.0–15.0)
Immature Granulocytes: 0 %
Lymphocytes Relative: 35 %
Lymphs Abs: 2.8 10*3/uL (ref 0.7–4.0)
MCH: 19.1 pg — ABNORMAL LOW (ref 26.0–34.0)
MCHC: 29.6 g/dL — ABNORMAL LOW (ref 30.0–36.0)
MCV: 64.6 fL — ABNORMAL LOW (ref 80.0–100.0)
Monocytes Absolute: 0.5 10*3/uL (ref 0.1–1.0)
Monocytes Relative: 6 %
Neutro Abs: 4.4 10*3/uL (ref 1.7–7.7)
Neutrophils Relative %: 54 %
Platelets: 266 10*3/uL (ref 150–400)
RBC: 4.86 MIL/uL (ref 3.87–5.11)
RDW: 21.5 % — ABNORMAL HIGH (ref 11.5–15.5)
WBC: 8.1 10*3/uL (ref 4.0–10.5)
nRBC: 0 % (ref 0.0–0.2)

## 2019-08-22 LAB — COMPREHENSIVE METABOLIC PANEL
ALT: 8 U/L (ref 0–44)
AST: 12 U/L — ABNORMAL LOW (ref 15–41)
Albumin: 4.2 g/dL (ref 3.5–5.0)
Alkaline Phosphatase: 109 U/L (ref 38–126)
Anion gap: 8 (ref 5–15)
BUN: 9 mg/dL (ref 8–23)
CO2: 27 mmol/L (ref 22–32)
Calcium: 9.3 mg/dL (ref 8.9–10.3)
Chloride: 102 mmol/L (ref 98–111)
Creatinine, Ser: 1.35 mg/dL — ABNORMAL HIGH (ref 0.44–1.00)
GFR calc Af Amer: 44 mL/min — ABNORMAL LOW (ref >60)
GFR calc non Af Amer: 38 mL/min — ABNORMAL LOW (ref >60)
Glucose, Bld: 122 mg/dL — ABNORMAL HIGH (ref 70–99)
Potassium: 4.2 mmol/L (ref 3.5–5.1)
Sodium: 137 mmol/L (ref 135–145)
Total Bilirubin: 0.5 mg/dL (ref 0.3–1.2)
Total Protein: 7.4 g/dL (ref 6.5–8.1)

## 2019-08-22 LAB — CREATININE, URINE, 24 HOUR
Collection Interval-UCRE24: 24 hours
Creatinine, 24H Ur: 813 mg/d (ref 600–1800)
Creatinine, Urine: 43.96 mg/dL
Urine Total Volume-UCRE24: 1850 mL

## 2019-08-22 LAB — VITAMIN B12: Vitamin B-12: 879 pg/mL (ref 180–914)

## 2019-08-22 LAB — IRON AND TIBC
Iron: 87 ug/dL (ref 28–170)
Saturation Ratios: 45 % — ABNORMAL HIGH (ref 10.4–31.8)
TIBC: 195 ug/dL — ABNORMAL LOW (ref 250–450)
UIBC: 108 ug/dL

## 2019-08-22 LAB — FOLATE: Folate: 47 ng/mL (ref >5.9)

## 2019-08-22 LAB — FERRITIN: Ferritin: 204 ng/mL (ref 11–307)

## 2019-08-24 DIAGNOSIS — H40023 Open angle with borderline findings, high risk, bilateral: Secondary | ICD-10-CM | POA: Diagnosis not present

## 2019-08-29 ENCOUNTER — Inpatient Hospital Stay (HOSPITAL_BASED_OUTPATIENT_CLINIC_OR_DEPARTMENT_OTHER): Payer: Medicare Other | Admitting: Hematology

## 2019-08-29 ENCOUNTER — Ambulatory Visit (HOSPITAL_COMMUNITY): Payer: Medicare Other | Admitting: Hematology

## 2019-08-29 ENCOUNTER — Encounter (HOSPITAL_COMMUNITY): Payer: Self-pay | Admitting: Hematology

## 2019-08-29 ENCOUNTER — Other Ambulatory Visit: Payer: Self-pay

## 2019-08-29 VITALS — BP 141/82 | HR 109 | Temp 97.1°F | Resp 18 | Wt 135.9 lb

## 2019-08-29 DIAGNOSIS — I10 Essential (primary) hypertension: Secondary | ICD-10-CM | POA: Diagnosis not present

## 2019-08-29 DIAGNOSIS — Z87891 Personal history of nicotine dependence: Secondary | ICD-10-CM | POA: Diagnosis not present

## 2019-08-29 DIAGNOSIS — D509 Iron deficiency anemia, unspecified: Secondary | ICD-10-CM | POA: Diagnosis not present

## 2019-08-29 DIAGNOSIS — N189 Chronic kidney disease, unspecified: Secondary | ICD-10-CM | POA: Diagnosis not present

## 2019-08-29 DIAGNOSIS — J45909 Unspecified asthma, uncomplicated: Secondary | ICD-10-CM | POA: Diagnosis not present

## 2019-08-29 DIAGNOSIS — Z809 Family history of malignant neoplasm, unspecified: Secondary | ICD-10-CM | POA: Diagnosis not present

## 2019-08-29 DIAGNOSIS — E119 Type 2 diabetes mellitus without complications: Secondary | ICD-10-CM | POA: Diagnosis not present

## 2019-08-29 DIAGNOSIS — Z79899 Other long term (current) drug therapy: Secondary | ICD-10-CM | POA: Diagnosis not present

## 2019-08-29 DIAGNOSIS — E538 Deficiency of other specified B group vitamins: Secondary | ICD-10-CM | POA: Diagnosis not present

## 2019-08-29 DIAGNOSIS — Z8249 Family history of ischemic heart disease and other diseases of the circulatory system: Secondary | ICD-10-CM | POA: Diagnosis not present

## 2019-08-29 DIAGNOSIS — Z833 Family history of diabetes mellitus: Secondary | ICD-10-CM | POA: Diagnosis not present

## 2019-08-29 NOTE — Patient Instructions (Addendum)
[facility] at [facility] Discharge Instructions  You were seen today by Dr. Katragadda. He went over your recent lab results. He will see you back in 2 months for labs and follow up.   Thank you for choosing [facility] at [facility] to provide your oncology and hematology care.  To afford each patient quality time with our provider, please arrive at least 15 minutes before your scheduled appointment time.   If you have a lab appointment with the Cancer Center please come in thru the  Main Entrance and check in at the main information desk  You need to re-schedule your appointment should you arrive 10 or more minutes late.  We strive to give you quality time with our providers, and arriving late affects you and other patients whose appointments are after yours.  Also, if you no show three or more times for appointments you may be dismissed from the clinic at the providers discretion.     Again, thank you for choosing [facility].  Our hope is that these requests will decrease the amount of time that you wait before being seen by our physicians.       _____________________________________________________________  Should you have questions after your visit to  Cancer Center, please contact our office at [phone] between the hours of 8:00 a.m. and 4:30 p.m.  Voicemails left after 4:00 p.m. will not be returned until the following business day.  For prescription refill requests, have your pharmacy contact our office and allow 72 hours.    Cancer Center Support Programs:   > Cancer Support Group  2nd Tuesday of the month 1pm-2pm, Journey Room    

## 2019-08-29 NOTE — Assessment & Plan Note (Addendum)
1.  Microcytic anemia: -Last colonoscopy was in June 2019 in [place], 3 polyps removed. -Etiology is from combination of CKD and relative iron deficiency. -We reviewed labs from 08/22/2019.  Hemoglobin is 9.3 with MCV of 64.6.  White count and platelets are normal. -Ferritin was 204 and percent saturation was 45.  SPEP was negative.  Stool was negative for occult blood and November 2020.  She had history of transfusion 1 time in early 2000. -Her hemoglobin is usually around 10.3 range.  It dropped by 1 g.  She denied any bleeding per rectum or melena. -I plan to repeat her CBC in 2 months.  We will also check hemoglobin electrophoresis as she has severe microcytosis related to to her anemia.  She could have underlying thalassemia/sickle cell trait. -If there is further worsening of anemia with hemoglobin approaching 8, will consider erythropoiesis stimulating agents.  2.  CKD: -Ultrasound of the kidneys on 08/22/2019 reviewed by me shows bilateral renal atrophy, severe atrophy of the right kidney. -Creatinine is stable around 1.35.  She follows up with nephrology.  3.  123456 and folic acid deficiency: -She is taking folic acid 1 mg daily and B12 injection monthly.

## 2019-08-29 NOTE — Progress Notes (Signed)
Traci Parker, [place] [id number]   CLINIC:  Medical Oncology/Hematology  PCP:  Maryruth Hancock, [place] [address] [phone]   REASON FOR VISIT:  Follow-up for microcytic anemia.  CURRENT THERAPY: B12 injections monthly and folic acid tablet daily.   INTERVAL HISTORY:  Traci Parker 74 y.o. female seen for follow-up of microcytic anemia.  Appetite and energy levels are 75%.  Denies any bleeding per rectum or melena.  Denies any chest pains or lightheadedness.  No fevers, night sweats or weight loss.  She is continuing B12 injections monthly.  She is taking folic acid daily.    REVIEW OF SYSTEMS:  Review of Systems  Psychiatric/Behavioral: Positive for sleep disturbance.  All other systems reviewed and are negative.    PAST MEDICAL/SURGICAL HISTORY:  Past Medical History:  Diagnosis Date  . Allergy   . Anemia   . Asthma   . Blood transfusion without reported diagnosis   . Diabetes mellitus without complication ([place])   . Glaucoma   . History of blood in urine   . Hypertension   . Osteoporosis    Past Surgical History:  Procedure Laterality Date  . CHOLECYSTECTOMY  2008  . COLONOSCOPY  04/30/2011   Procedure: COLONOSCOPY;  Surgeon: Rogene Houston, MD;  [facility];  Service: Endoscopy;  Laterality: N/A;  . COLONOSCOPY  05/25/2012   Procedure: COLONOSCOPY;  Surgeon: Rogene Houston, MD;  [facility];  Service: Endoscopy;  Laterality: N/A;  1:25-changed to 1200 Ann to notify pt  . COLONOSCOPY Bilateral 12/2017  . GALLBLADDER SURGERY    . right breast cystectomy  1988  . TUBAL LIGATION  1975     SOCIAL HISTORY:  Social History   Socioeconomic History  . Marital status: Divorced    Spouse name: Not on file  . Number of children: 4  . Years of education: Not on file  . Highest education level: Not on file  Occupational History  . Not on file  Tobacco Use  . Smoking status: Former  Smoker    Packs/day: 1.00    Years: 40.00    Pack years: 40.00    Quit date: 04/03/2011    Years since quitting: 8.4  . Smokeless tobacco: Never Used  Substance and Sexual Activity  . Alcohol use: No  . Drug use: No  . Sexual activity: Not Currently  Other Topics Concern  . Not on file  Social History Narrative  . Not on file   Social Determinants of Health   Financial Resource Strain:   . Difficulty of Paying Living Expenses: Not on file  Food Insecurity:   . Worried About Charity fundraiser in the Last Year: Not on file  . Ran Out of Food in the Last Year: Not on file  Transportation Needs:   . Lack of Transportation (Medical): Not on file  . Lack of Transportation (Non-Medical): Not on file  Physical Activity:   . Days of Exercise per Week: Not on file  . Minutes of Exercise per Session: Not on file  Stress:   . Feeling of Stress : Not on file  Social Connections:   . Frequency of Communication with Friends and Family: Not on file  . Frequency of Social Gatherings with Friends and Family: Not on file  . Attends Religious Services: Not on file  . Active Member of Clubs or Organizations: Not on file  . Attends  Club or Organization Meetings: Not on file  . Marital Status: Not on file  Intimate Partner Violence:   . Fear of Current or Ex-Partner: Not on file  . Emotionally Abused: Not on file  . Physically Abused: Not on file  . Sexually Abused: Not on file    FAMILY HISTORY:  Family History  Problem Relation Age of Onset  . Diabetes Mother   . Hyperlipidemia Mother   . Hypertension Mother   . Heart disease Father   . Hyperlipidemia Father   . Diabetes Sister   . Hyperlipidemia Sister   . Hypertension Sister   . Cancer Brother   . Heart disease Brother   . Hyperlipidemia Brother   . Hypertension Brother   . Diabetes Sister     CURRENT MEDICATIONS:  Outpatient Encounter Medications as of 08/29/2019  Medication Sig  . amLODipine (NORVASC) 5 MG tablet Take  2 tablets (10 mg total) by mouth daily.  Marland Kitchen atorvastatin (LIPITOR) 10 MG tablet Take 1 tablet (10 mg total) by mouth daily.  . calcium-vitamin D (OSCAL WITH D) 500-200 MG-UNIT tablet Take 1 tablet by mouth daily.  . cyanocobalamin (,VITAMIN B-12,) 1000 MCG/ML injection Inject 1 mL (1,000 mcg total) into the muscle every 30 (thirty) days.  . dorzolamide (TRUSOPT) 2 % ophthalmic solution Place 1 drop into both eyes 2 (two) times daily.  . folic acid (FOLVITE) 1 MG tablet Take 1 tablet (1 mg total) by mouth daily.  Marland Kitchen latanoprost (XALATAN) 0.005 % ophthalmic solution Place 1 drop into both eyes at bedtime.  . Multiple Vitamin (MULTIVITAMIN) tablet Take 1 tablet by mouth daily.    No facility-administered encounter medications on file as of 08/29/2019.    ALLERGIES:  Allergies  Allergen Reactions  . Latex Rash  . Neomycin-Bacitracin-Polymyxin  [Bacitracin-Neomycin-Polymyxin] Rash  . Other Rash  . Fish Allergy     rash  . Neosporin [Neomycin-Polymyxin-Gramicidin] Itching and Rash  . Prednisone Hives, Itching and Rash     PHYSICAL EXAM:  ECOG Performance status: 1  Vitals:   08/29/19 1132  BP: (!) 141/82  Pulse: (!) 109  Resp: 18  Temp: (!) 97.1 F (36.2 C)  SpO2: 100%   Filed Weights   08/29/19 1132  Weight: 135 lb 14.4 oz (61.6 kg)    Physical Exam Vitals reviewed.  Constitutional:      Appearance: Normal appearance.  Cardiovascular:     Rate and Rhythm: Normal rate and regular rhythm.     Heart sounds: Normal heart sounds.  Pulmonary:     Effort: Pulmonary effort is normal.     Breath sounds: Normal breath sounds.  Abdominal:     General: There is no distension.     Palpations: Abdomen is soft. There is no mass.  Skin:    General: Skin is warm.  Neurological:     General: No focal deficit present.     Mental Status: She is alert and oriented to person, place, and time.  Psychiatric:        Mood and Affect: Mood normal.        Behavior: Behavior normal.       LABORATORY DATA:  I have reviewed the labs as listed.  CBC    Component Value Date/Time   WBC 8.1 08/22/2019 1254   RBC 4.86 08/22/2019 1254   HGB 9.3 (L) 08/22/2019 1254   HCT 31.4 (L) 08/22/2019 1254   PLT 266 08/22/2019 1254   MCV 64.6 (L) 08/22/2019 1254   MCH  19.1 (L) 08/22/2019 1254   MCHC 29.6 (L) 08/22/2019 1254   RDW 21.5 (H) 08/22/2019 1254   LYMPHSABS 2.8 08/22/2019 1254   MONOABS 0.5 08/22/2019 1254   EOSABS 0.3 08/22/2019 1254   BASOSABS 0.0 08/22/2019 1254   CMP Latest Ref Rng & Units 08/22/2019 05/08/2019 03/24/2019  Glucose 70 - 99 mg/dL 122(H) 135(H) 112(H)  BUN 8 - 23 mg/dL 9 11 10   Creatinine 0.44 - 1.00 mg/dL 1.35(H) 1.26(H) 1.38(H)  Sodium 135 - 145 mmol/L 137 133(L) 140  Potassium 3.5 - 5.1 mmol/L 4.2 3.7 4.4  Chloride 98 - 111 mmol/L 102 99 104  CO2 22 - 32 mmol/L 27 23 28   Calcium 8.9 - 10.3 mg/dL 9.3 9.1 9.3  Total Protein 6.5 - 8.1 g/dL 7.4 7.8 7.0  Total Bilirubin 0.3 - 1.2 mg/dL 0.5 0.5 0.5  Alkaline Phos 38 - 126 U/L 109 115 -  AST 15 - 41 U/L 12(L) 11(L) 7(L)  ALT 0 - 44 U/L 8 8 4(L)       DIAGNOSTIC IMAGING:  I have independently reviewed the scans and discussed with the patient.    ASSESSMENT & PLAN:   Microcytic anemia 1.  Microcytic anemia: -Last colonoscopy was in June 2019 in [place], 3 polyps removed. -Etiology is from combination of CKD and relative iron deficiency. -We reviewed labs from 08/22/2019.  Hemoglobin is 9.3 with MCV of 64.6.  White count and platelets are normal. -Ferritin was 204 and percent saturation was 45.  SPEP was negative.  Stool was negative for occult blood and November 2020.  She had history of transfusion 1 time in early 2000. -Her hemoglobin is usually around 10.3 range.  It dropped by 1 g.  She denied any bleeding per rectum or melena. -I plan to repeat her CBC in 2 months.  We will also check hemoglobin electrophoresis as she has severe microcytosis related to to her anemia.  She could have underlying  thalassemia/sickle cell trait. -If there is further worsening of anemia with hemoglobin approaching 8, will consider erythropoiesis stimulating agents.  2.  CKD: -Ultrasound of the kidneys on 08/22/2019 reviewed by me shows bilateral renal atrophy, severe atrophy of the right kidney. -Creatinine is stable around 1.35.  She follows up with nephrology.  3.  123456 and folic acid deficiency: -She is taking folic acid 1 mg daily and B12 injection monthly.      Orders placed this encounter:  Orders Placed This Encounter  Procedures  . Hemoglobinopathy evaluation  . CBC with Differential/Platelet      Derek Jack, MD [place] [phone]

## 2019-09-19 ENCOUNTER — Other Ambulatory Visit: Payer: Self-pay | Admitting: Family Medicine

## 2019-09-20 ENCOUNTER — Other Ambulatory Visit: Payer: Self-pay

## 2019-09-20 ENCOUNTER — Encounter: Payer: Self-pay | Admitting: Family Medicine

## 2019-09-20 ENCOUNTER — Ambulatory Visit (INDEPENDENT_AMBULATORY_CARE_PROVIDER_SITE_OTHER): Payer: Medicare Other | Admitting: Family Medicine

## 2019-09-20 VITALS — BP 122/74 | HR 100 | Temp 97.6°F | Resp 12 | Ht 64.0 in | Wt 132.8 lb

## 2019-09-20 DIAGNOSIS — E785 Hyperlipidemia, unspecified: Secondary | ICD-10-CM | POA: Diagnosis not present

## 2019-09-20 DIAGNOSIS — R7303 Prediabetes: Secondary | ICD-10-CM | POA: Diagnosis not present

## 2019-09-20 DIAGNOSIS — D649 Anemia, unspecified: Secondary | ICD-10-CM | POA: Diagnosis not present

## 2019-09-20 DIAGNOSIS — M810 Age-related osteoporosis without current pathological fracture: Secondary | ICD-10-CM | POA: Diagnosis not present

## 2019-09-20 DIAGNOSIS — I10 Essential (primary) hypertension: Secondary | ICD-10-CM | POA: Diagnosis not present

## 2019-09-20 LAB — POCT GLYCOSYLATED HEMOGLOBIN (HGB A1C): Hemoglobin A1C: 6 % — AB (ref 4.0–5.6)

## 2019-09-20 MED ORDER — ATORVASTATIN CALCIUM 10 MG PO TABS: 10.0000 mg | ORAL_TABLET | Freq: Every day | ORAL | 1 refills | Status: DC

## 2019-09-20 MED ORDER — AMLODIPINE BESYLATE 5 MG PO TABS: 10.0000 mg | ORAL_TABLET | Freq: Every day | ORAL | 1 refills | Status: DC

## 2019-09-20 NOTE — Progress Notes (Signed)
Established Patient Office Visit  Subjective:  Patient ID: Traci Parker, female    DOB: 1946-01-01  Age: 74 y.o. MRN: [id number]  CC:  Chief Complaint  Patient presents with  . Follow-up    6 momnth f/u. having trouble sleeping and some foods do not taste right. Need refills until can get in with a new doctor    HPI Traci Parker presents for difficulty sleeping, difficulty eating "nothing taste good" difficult to chew-dentures don't fit.  DM-no medication currently-glucose checks at home-92, 117-fasting CKD-appt Friday with nephrology-evaluation ongoing-labwork/renal u/s- Hyperlipidemia-Lipitor daily-9/20-LDL 87 Anemia-pt states she was back on B12 shot-restarted by hematology-pt sees hematology in April-folic acid Glaucoma-normal pressure-drops Asthma-no inhalers, no SOB Osteoporosis-needs DEXA mammo 9/20-normal Past Medical History:  Diagnosis Date  . Allergy   . Anemia   . Asthma   . Blood transfusion without reported diagnosis   . Diabetes mellitus without complication (HCC)   . Glaucoma   . History of blood in urine   . Hypertension   . Osteoporosis     Past Surgical History:  Procedure Laterality Date  . CHOLECYSTECTOMY  2008  . COLONOSCOPY  04/30/2011   Procedure: COLONOSCOPY;  Surgeon: Malissa Hippo, MD;  [facility];  Service: Endoscopy;  Laterality: N/A;  . COLONOSCOPY  05/25/2012   Procedure: COLONOSCOPY;  Surgeon: Malissa Hippo, MD;  [facility];  Service: Endoscopy;  Laterality: N/A;  1:25-changed to 1200 Ann to notify pt  . COLONOSCOPY Bilateral 12/2017  . GALLBLADDER SURGERY    . right breast cystectomy  1988  . TUBAL LIGATION  1975    Family History  Problem Relation Age of Onset  . Diabetes Mother   . Hyperlipidemia Mother   . Hypertension Mother   . Heart disease Father   . Hyperlipidemia Father   . Diabetes Sister   . Hyperlipidemia Sister   . Hypertension Sister   . Cancer Brother   . Heart disease Brother    . Hyperlipidemia Brother   . Hypertension Brother   . Diabetes Sister     Social History   Socioeconomic History  . Marital status: Divorced    Spouse name: Not on file  . Number of children: 4  . Years of education: Not on file  . Highest education level: Not on file  Occupational History  . Not on file  Tobacco Use  . Smoking status: Former Smoker    Packs/day: 1.00    Years: 40.00    Pack years: 40.00    Quit date: 04/03/2011    Years since quitting: 8.4  . Smokeless tobacco: Never Used  Substance and Sexual Activity  . Alcohol use: No  . Drug use: No  . Sexual activity: Not Currently  Other Topics Concern  . Not on file  Social History Narrative  . Not on file   Social Determinants of Health   Financial Resource Strain:   . Difficulty of Paying Living Expenses: Not on file  Food Insecurity:   . Worried About Programme researcher, broadcasting/film/video in the Last Year: Not on file  . Ran Out of Food in the Last Year: Not on file  Transportation Needs:   . Lack of Transportation (Medical): Not on file  . Lack of Transportation (Non-Medical): Not on file  Physical Activity:   . Days of Exercise per Week: Not on file  . Minutes of Exercise per Session: Not on file  Stress:   . Feeling of  Stress : Not on file  Social Connections:   . Frequency of Communication with Friends and Family: Not on file  . Frequency of Social Gatherings with Friends and Family: Not on file  . Attends Religious Services: Not on file  . Active Member of Clubs or Organizations: Not on file  . Attends Banker Meetings: Not on file  . Marital Status: Not on file  Intimate Partner Violence:   . Fear of Current or Ex-Partner: Not on file  . Emotionally Abused: Not on file  . Physically Abused: Not on file  . Sexually Abused: Not on file    Outpatient Medications Prior to Visit  Medication Sig Dispense Refill  . amLODipine (NORVASC) 5 MG tablet Take 2 tablets by mouth once daily 180 tablet 0  .  atorvastatin (LIPITOR) 10 MG tablet Take 1 tablet (10 mg total) by mouth daily. 90 tablet 0  . calcium-vitamin D (OSCAL WITH D) 500-200 MG-UNIT tablet Take 1 tablet by mouth daily.    . cyanocobalamin (,VITAMIN B-12,) 1000 MCG/ML injection Inject 1 mL (1,000 mcg total) into the muscle every 30 (thirty) days. 30 mL 0  . dorzolamide (TRUSOPT) 2 % ophthalmic solution Place 1 drop into both eyes 2 (two) times daily.    . folic acid (FOLVITE) 1 MG tablet Take 1 tablet (1 mg total) by mouth daily. 30 tablet 6  . latanoprost (XALATAN) 0.005 % ophthalmic solution Place 1 drop into both eyes at bedtime.    . Multiple Vitamin (MULTIVITAMIN) tablet Take 1 tablet by mouth daily.      No facility-administered medications prior to visit.    Allergies  Allergen Reactions  . Latex Rash  . Neomycin-Bacitracin-Polymyxin  [Bacitracin-Neomycin-Polymyxin] Rash  . Other Rash  . Fish Allergy     rash  . Neosporin [Neomycin-Polymyxin-Gramicidin] Itching and Rash  . Prednisone Hives, Itching and Rash    ROS Review of Systems  Constitutional: Negative.   HENT: Negative.   Eyes:       Glaucoma  Respiratory: Negative.   Cardiovascular: Negative.   Gastrointestinal:       Anemia  Endocrine:       DM  Genitourinary:       Renal specialist -appt Friday for CKD  Musculoskeletal: Negative.   Allergic/Immunologic: Positive for environmental allergies.  Neurological: Negative.   Hematological:       Anemia  Psychiatric/Behavioral: Negative.       Objective:    Physical Exam  Constitutional: She is oriented to person, place, and time. She appears well-developed and well-nourished.  HENT:  Head: Normocephalic and atraumatic.  Eyes: Conjunctivae are normal.  Cardiovascular: Normal rate, regular rhythm, normal heart sounds and intact distal pulses.  Pulmonary/Chest: She is in respiratory distress.  Neurological: She is oriented to person, place, and time.  Psychiatric: She has a normal mood and  affect. Her behavior is normal.    BP 122/74 (BP Location: Right Arm, Patient Position: Sitting, Cuff Size: Normal)   Pulse 100   Temp 97.6 F (36.4 C) (Temporal)   Resp 12   Ht 5\' 4"  (1.626 m)   Wt 132 lb 12.8 oz (60.2 kg)   SpO2 97%   BMI 22.80 kg/m  Wt Readings from Last 3 Encounters:  09/20/19 132 lb 12.8 oz (60.2 kg)  08/29/19 135 lb 14.4 oz (61.6 kg)  05/29/19 146 lb 14.4 oz (66.6 kg)    Health Maintenance Due  Topic Date Due  . PNA vac Low Risk Adult (  2 of 2 - PCV13) 09/25/2018    No results found for: TSH Lab Results  Component Value Date   WBC 8.1 08/22/2019   HGB 9.3 (L) 08/22/2019   HCT 31.4 (L) 08/22/2019   MCV 64.6 (L) 08/22/2019   PLT 266 08/22/2019   Lab Results  Component Value Date   NA 137 08/22/2019   K 4.2 08/22/2019   CO2 27 08/22/2019   GLUCOSE 122 (H) 08/22/2019   BUN 9 08/22/2019   CREATININE 1.35 (H) 08/22/2019   BILITOT 0.5 08/22/2019   ALKPHOS 109 08/22/2019   AST 12 (L) 08/22/2019   ALT 8 08/22/2019   PROT 7.4 08/22/2019   ALBUMIN 4.2 08/22/2019   CALCIUM 9.3 08/22/2019   ANIONGAP 8 08/22/2019   Lab Results  Component Value Date   CHOL 149 03/24/2019   Lab Results  Component Value Date   HDL 43 (L) 03/24/2019   Lab Results  Component Value Date   LDLCALC 87 03/24/2019   Lab Results  Component Value Date   TRIG 93 03/24/2019   Lab Results  Component Value Date   CHOLHDL 3.5 03/24/2019   Lab Results  Component Value Date   HGBA1C 5.8 (H) 03/24/2019      Assessment & Plan:  1. Osteoporosis, unspecified osteoporosis type, unspecified pathological fracture presence - DG Bone Density; Future Calcium daily 2. Anemia, unspecified type Hematology following 3. Hypertension, unspecified type Amlodipine daily-stable 4. Hyperlipidemia, unspecified hyperlipidemia type Lipitor daily-9/20 panel, lft 5. Pre-diabetes A1c 6.0% Follow-up: 6 months-pt to find new primary care doctor   Somya Jauregui Mat Carne, MD

## 2019-09-20 NOTE — Patient Instructions (Addendum)
COVID-19 Vaccine Information can be found at: [email or web address] For questions related to vaccine distribution or appointments, please email [email or web address] or call [phone].   Keep appointment with hematology and kidney specialist  If you have lab work done today you will be contacted with your lab results within the next 2 weeks.  If you have not heard from [place] then please contact us. The fastest way to get your results is to register for My Chart.   IF you received an x-ray today, you will receive an invoice from [facility]. Please contact [facility] at [phone] with questions or concerns regarding your invoice.   IF you received labwork today, you will receive an invoice from [facility]. Please contact [facility] at [phone] with questions or concerns regarding your invoice.   Our billing staff will not be able to assist you with questions regarding bills from these companies.  You will be contacted with the lab results as soon as they are available. The fastest way to get your results is to activate your My Chart account. Instructions are located on the last page of this paperwork. If you have not heard from [place] regarding the results in 2 weeks, please contact this office.

## 2019-09-22 DIAGNOSIS — D638 Anemia in other chronic diseases classified elsewhere: Secondary | ICD-10-CM | POA: Diagnosis not present

## 2019-09-22 DIAGNOSIS — N189 Chronic kidney disease, unspecified: Secondary | ICD-10-CM | POA: Diagnosis not present

## 2019-09-22 DIAGNOSIS — R809 Proteinuria, unspecified: Secondary | ICD-10-CM | POA: Diagnosis not present

## 2019-09-22 DIAGNOSIS — E871 Hypo-osmolality and hyponatremia: Secondary | ICD-10-CM | POA: Diagnosis not present

## 2019-09-22 DIAGNOSIS — E1122 Type 2 diabetes mellitus with diabetic chronic kidney disease: Secondary | ICD-10-CM | POA: Diagnosis not present

## 2019-09-26 ENCOUNTER — Encounter (HOSPITAL_COMMUNITY): Payer: Self-pay

## 2019-09-26 ENCOUNTER — Encounter (HOSPITAL_COMMUNITY)
Admission: RE | Admit: 2019-09-26 | Discharge: 2019-09-26 | Disposition: A | Discharge status: Home / Self Care | Payer: Medicare Other | Source: Ambulatory Visit | Attending: Nephrology | Admitting: Nephrology

## 2019-09-26 ENCOUNTER — Other Ambulatory Visit: Payer: Self-pay

## 2019-09-26 DIAGNOSIS — D631 Anemia in chronic kidney disease: Secondary | ICD-10-CM | POA: Diagnosis not present

## 2019-09-26 DIAGNOSIS — N1832 Chronic kidney disease, stage 3b: Secondary | ICD-10-CM | POA: Diagnosis not present

## 2019-09-26 LAB — POCT HEMOGLOBIN-HEMACUE: Hemoglobin: 8.4 g/dL — ABNORMAL LOW (ref 12.0–15.0)

## 2019-09-26 MED ORDER — EPOETIN ALFA 3000 UNIT/ML IJ SOLN
3000.0000 [IU] | Freq: Once | INTRAMUSCULAR | Status: AC
  Administered 2019-09-26: 3000 [IU] via SUBCUTANEOUS
  Filled 2019-09-26: qty 1

## 2019-10-10 ENCOUNTER — Encounter (HOSPITAL_COMMUNITY): Payer: Self-pay

## 2019-10-10 ENCOUNTER — Encounter (HOSPITAL_COMMUNITY)
Admission: RE | Admit: 2019-10-10 | Discharge: 2019-10-10 | Disposition: A | Discharge status: Home / Self Care | Payer: Medicare Other | Source: Ambulatory Visit | Attending: Nephrology | Admitting: Nephrology

## 2019-10-10 ENCOUNTER — Other Ambulatory Visit: Payer: Self-pay

## 2019-10-10 DIAGNOSIS — N1832 Chronic kidney disease, stage 3b: Secondary | ICD-10-CM | POA: Diagnosis not present

## 2019-10-10 DIAGNOSIS — D631 Anemia in chronic kidney disease: Secondary | ICD-10-CM | POA: Diagnosis not present

## 2019-10-10 LAB — POCT HEMOGLOBIN-HEMACUE: Hemoglobin: 9.1 g/dL — ABNORMAL LOW (ref 12.0–15.0)

## 2019-10-10 MED ORDER — EPOETIN ALFA 3000 UNIT/ML IJ SOLN
3000.0000 [IU] | Freq: Once | INTRAMUSCULAR | Status: AC
  Administered 2019-10-10: 3000 [IU] via SUBCUTANEOUS
  Filled 2019-10-10: qty 1

## 2019-10-24 ENCOUNTER — Encounter (HOSPITAL_COMMUNITY): Payer: Self-pay

## 2019-10-24 ENCOUNTER — Encounter (HOSPITAL_COMMUNITY)
Admission: RE | Admit: 2019-10-24 | Discharge: 2019-10-24 | Disposition: A | Discharge status: Home / Self Care | Payer: Medicare Other | Source: Ambulatory Visit | Attending: Nephrology | Admitting: Nephrology

## 2019-10-24 ENCOUNTER — Inpatient Hospital Stay (HOSPITAL_COMMUNITY): Payer: Medicare Other | Attending: Hematology

## 2019-10-24 ENCOUNTER — Ambulatory Visit (HOSPITAL_COMMUNITY)
Admission: RE | Admit: 2019-10-24 | Discharge: 2019-10-24 | Disposition: A | Discharge status: Home / Self Care | Payer: Medicare Other | Source: Ambulatory Visit | Attending: Family Medicine | Admitting: Family Medicine

## 2019-10-24 ENCOUNTER — Other Ambulatory Visit: Payer: Self-pay

## 2019-10-24 DIAGNOSIS — Z8349 Family history of other endocrine, nutritional and metabolic diseases: Secondary | ICD-10-CM | POA: Insufficient documentation

## 2019-10-24 DIAGNOSIS — N189 Chronic kidney disease, unspecified: Secondary | ICD-10-CM | POA: Insufficient documentation

## 2019-10-24 DIAGNOSIS — Z79899 Other long term (current) drug therapy: Secondary | ICD-10-CM | POA: Insufficient documentation

## 2019-10-24 DIAGNOSIS — D509 Iron deficiency anemia, unspecified: Secondary | ICD-10-CM | POA: Insufficient documentation

## 2019-10-24 DIAGNOSIS — E119 Type 2 diabetes mellitus without complications: Secondary | ICD-10-CM | POA: Insufficient documentation

## 2019-10-24 DIAGNOSIS — N1832 Chronic kidney disease, stage 3b: Secondary | ICD-10-CM | POA: Insufficient documentation

## 2019-10-24 DIAGNOSIS — Z780 Asymptomatic menopausal state: Secondary | ICD-10-CM | POA: Diagnosis not present

## 2019-10-24 DIAGNOSIS — J45909 Unspecified asthma, uncomplicated: Secondary | ICD-10-CM | POA: Insufficient documentation

## 2019-10-24 DIAGNOSIS — M810 Age-related osteoporosis without current pathological fracture: Secondary | ICD-10-CM | POA: Insufficient documentation

## 2019-10-24 DIAGNOSIS — Z87891 Personal history of nicotine dependence: Secondary | ICD-10-CM | POA: Insufficient documentation

## 2019-10-24 DIAGNOSIS — Z8249 Family history of ischemic heart disease and other diseases of the circulatory system: Secondary | ICD-10-CM | POA: Insufficient documentation

## 2019-10-24 DIAGNOSIS — D631 Anemia in chronic kidney disease: Secondary | ICD-10-CM | POA: Diagnosis not present

## 2019-10-24 DIAGNOSIS — Z833 Family history of diabetes mellitus: Secondary | ICD-10-CM | POA: Insufficient documentation

## 2019-10-24 DIAGNOSIS — E538 Deficiency of other specified B group vitamins: Secondary | ICD-10-CM | POA: Insufficient documentation

## 2019-10-24 DIAGNOSIS — I10 Essential (primary) hypertension: Secondary | ICD-10-CM | POA: Insufficient documentation

## 2019-10-24 DIAGNOSIS — Z809 Family history of malignant neoplasm, unspecified: Secondary | ICD-10-CM | POA: Insufficient documentation

## 2019-10-24 LAB — CBC WITH DIFFERENTIAL/PLATELET
Abs Immature Granulocytes: 0.03 10*3/uL (ref 0.00–0.07)
Basophils Absolute: 0.1 10*3/uL (ref 0.0–0.1)
Basophils Relative: 1 %
Eosinophils Absolute: 0.3 10*3/uL (ref 0.0–0.5)
Eosinophils Relative: 4 %
HCT: 30.6 % — ABNORMAL LOW (ref 36.0–46.0)
Hemoglobin: 8.8 g/dL — ABNORMAL LOW (ref 12.0–15.0)
Immature Granulocytes: 0 %
Lymphocytes Relative: 28 %
Lymphs Abs: 2.2 10*3/uL (ref 0.7–4.0)
MCH: 18.1 pg — ABNORMAL LOW (ref 26.0–34.0)
MCHC: 28.8 g/dL — ABNORMAL LOW (ref 30.0–36.0)
MCV: 63 fL — ABNORMAL LOW (ref 80.0–100.0)
Monocytes Absolute: 0.5 10*3/uL (ref 0.1–1.0)
Monocytes Relative: 7 %
Neutro Abs: 4.8 10*3/uL (ref 1.7–7.7)
Neutrophils Relative %: 60 %
Platelets: 260 10*3/uL (ref 150–400)
RBC: 4.86 MIL/uL (ref 3.87–5.11)
RDW: 22.2 % — ABNORMAL HIGH (ref 11.5–15.5)
WBC: 7.9 10*3/uL (ref 4.0–10.5)
nRBC: 0 % (ref 0.0–0.2)

## 2019-10-24 LAB — POCT HEMOGLOBIN-HEMACUE: Hemoglobin: 8.4 g/dL — ABNORMAL LOW (ref 12.0–15.0)

## 2019-10-24 MED ORDER — EPOETIN ALFA 3000 UNIT/ML IJ SOLN
3000.0000 [IU] | Freq: Once | INTRAMUSCULAR | Status: AC
  Administered 2019-10-24: 3000 [IU] via SUBCUTANEOUS

## 2019-10-24 MED ORDER — EPOETIN ALFA 3000 UNIT/ML IJ SOLN
INTRAMUSCULAR | Status: AC
  Filled 2019-10-24: qty 1

## 2019-10-25 ENCOUNTER — Inpatient Hospital Stay (HOSPITAL_COMMUNITY): Payer: Medicare Other | Admitting: Nurse Practitioner

## 2019-10-25 ENCOUNTER — Encounter: Payer: Self-pay | Admitting: Emergency Medicine

## 2019-10-25 VITALS — BP 126/64 | HR 100 | Temp 96.6°F | Resp 18 | Wt 128.2 lb

## 2019-10-25 DIAGNOSIS — J45909 Unspecified asthma, uncomplicated: Secondary | ICD-10-CM | POA: Diagnosis not present

## 2019-10-25 DIAGNOSIS — Z87891 Personal history of nicotine dependence: Secondary | ICD-10-CM | POA: Diagnosis not present

## 2019-10-25 DIAGNOSIS — Z8349 Family history of other endocrine, nutritional and metabolic diseases: Secondary | ICD-10-CM | POA: Diagnosis not present

## 2019-10-25 DIAGNOSIS — Z8249 Family history of ischemic heart disease and other diseases of the circulatory system: Secondary | ICD-10-CM | POA: Diagnosis not present

## 2019-10-25 DIAGNOSIS — E538 Deficiency of other specified B group vitamins: Secondary | ICD-10-CM | POA: Diagnosis not present

## 2019-10-25 DIAGNOSIS — D631 Anemia in chronic kidney disease: Secondary | ICD-10-CM

## 2019-10-25 DIAGNOSIS — D509 Iron deficiency anemia, unspecified: Secondary | ICD-10-CM | POA: Diagnosis not present

## 2019-10-25 DIAGNOSIS — I10 Essential (primary) hypertension: Secondary | ICD-10-CM | POA: Diagnosis not present

## 2019-10-25 DIAGNOSIS — R630 Anorexia: Secondary | ICD-10-CM

## 2019-10-25 DIAGNOSIS — E119 Type 2 diabetes mellitus without complications: Secondary | ICD-10-CM | POA: Diagnosis not present

## 2019-10-25 DIAGNOSIS — N189 Chronic kidney disease, unspecified: Secondary | ICD-10-CM

## 2019-10-25 DIAGNOSIS — Z833 Family history of diabetes mellitus: Secondary | ICD-10-CM | POA: Diagnosis not present

## 2019-10-25 DIAGNOSIS — Z809 Family history of malignant neoplasm, unspecified: Secondary | ICD-10-CM | POA: Diagnosis not present

## 2019-10-25 DIAGNOSIS — Z79899 Other long term (current) drug therapy: Secondary | ICD-10-CM | POA: Diagnosis not present

## 2019-10-25 MED ORDER — MIRTAZAPINE 7.5 MG PO TABS: 7.5000 mg | ORAL_TABLET | Freq: Every day | ORAL | 1 refills | Status: DC

## 2019-10-25 NOTE — Progress Notes (Signed)
[place] [place], [place] [id number]   CLINIC:  Medical Oncology/Hematology  PCP:  Maryruth Hancock, [place] [address] [phone]   REASON FOR VISIT: Follow-up for microcytic anemia  CURRENT THERAPY: Epogen injections every 2 weeks   INTERVAL HISTORY:  [name] 74 y.o. female returns for routine follow-up for microcytic anemia.  Patient reports she is doing well since her last visit.  She still reports she has some fatigue.  She also reports her appetite is severely decreased. Denies any nausea, vomiting, or diarrhea. Denies any new pains. Had not noticed any recent bleeding such as epistaxis, hematuria or hematochezia. Denies recent chest pain on exertion, shortness of breath on minimal exertion, pre-syncopal episodes, or palpitations. Denies any numbness or tingling in hands or feet. Denies any recent fevers, infections, or recent hospitalizations. Patient reports appetite at 0% and energy level at 75%.     REVIEW OF SYSTEMS:  Review of Systems  Constitutional: Positive for appetite change and fatigue.  Gastrointestinal: Positive for vomiting.  Psychiatric/Behavioral: Positive for sleep disturbance.  All other systems reviewed and are negative.    PAST MEDICAL/SURGICAL HISTORY:  Past Medical History:  Diagnosis Date  . Allergy   . Anemia   . Asthma   . Blood transfusion without reported diagnosis   . Diabetes mellitus without complication ([place])   . Glaucoma   . History of blood in urine   . Hypertension   . Osteoporosis    Past Surgical History:  Procedure Laterality Date  . CHOLECYSTECTOMY  2008  . COLONOSCOPY  04/30/2011   Procedure: COLONOSCOPY;  Surgeon: Rogene [place], MD;  [facility];  Service: Endoscopy;  Laterality: N/A;  . COLONOSCOPY  05/25/2012   Procedure: COLONOSCOPY;  Surgeon: Rogene [place], MD;  [facility];  Service: Endoscopy;  Laterality: N/A;  1:25-changed to 1200  Ann to notify pt  . COLONOSCOPY Bilateral 12/2017  . GALLBLADDER SURGERY    . right breast cystectomy  1988  . TUBAL LIGATION  1975     SOCIAL HISTORY:  Social History   Socioeconomic History  . Marital status: Divorced    Spouse name: Not on file  . Number of children: 4  . Years of education: Not on file  . Highest education level: Not on file  Occupational History  . Not on file  Tobacco Use  . Smoking status: Former Smoker    Packs/day: 1.00    Years: 40.00    Pack years: 40.00    Quit date: 04/03/2011    Years since quitting: 8.5  . Smokeless tobacco: Never Used  Substance and Sexual Activity  . Alcohol use: No  . Drug use: No  . Sexual activity: Not Currently  Other Topics Concern  . Not on file  Social History Narrative  . Not on file   Social Determinants of Health   Financial Resource Strain:   . Difficulty of Paying Living Expenses:   Food Insecurity:   . Worried About Charity fundraiser in the Last Year:   . Arboriculturist in the Last Year:   Transportation Needs:   . Film/video editor (Medical):   Marland Kitchen Lack of Transportation (Non-Medical):   Physical Activity:   . Days of Exercise per Week:   . Minutes of Exercise per Session:   Stress:   . Feeling of Stress :   Social Connections:   . Frequency  of Communication with Friends and Family:   . Frequency of Social Gatherings with Friends and Family:   . Attends Religious Services:   . Active Member of Clubs or Organizations:   . Attends Archivist Meetings:   Marland Kitchen Marital Status:   Intimate Partner Violence:   . Fear of Current or Ex-Partner:   . Emotionally Abused:   Marland Kitchen Physically Abused:   . Sexually Abused:     FAMILY HISTORY:  Family History  Problem Relation Age of Onset  . Diabetes Mother   . Hyperlipidemia Mother   . Hypertension Mother   . Heart disease Father   . Hyperlipidemia Father   . Diabetes Sister   . Hyperlipidemia Sister   . Hypertension Sister   . Cancer  Brother   . Heart disease Brother   . Hyperlipidemia Brother   . Hypertension Brother   . Diabetes Sister     CURRENT MEDICATIONS:  Outpatient Encounter Medications as of 10/25/2019  Medication Sig  . amLODipine (NORVASC) 5 MG tablet Take 2 tablets (10 mg total) by mouth daily.  Marland Kitchen atorvastatin (LIPITOR) 10 MG tablet Take 1 tablet (10 mg total) by mouth daily.  . calcium-vitamin D (OSCAL WITH D) 500-200 MG-UNIT tablet Take 1 tablet by mouth daily.  . cyanocobalamin (,VITAMIN B-12,) 1000 MCG/ML injection Inject 1 mL (1,000 mcg total) into the muscle every 30 (thirty) days.  . dorzolamide (TRUSOPT) 2 % ophthalmic solution Place 1 drop into both eyes 2 (two) times daily.  Marland Kitchen epoetin alfa (EPOGEN) 3000 UNIT/ML injection Inject 3,000 Units into the vein every 14 (fourteen) days.  . folic acid (FOLVITE) 1 MG tablet Take 1 tablet (1 mg total) by mouth daily.  Marland Kitchen latanoprost (XALATAN) 0.005 % ophthalmic solution Place 1 drop into both eyes at bedtime.  . Multiple Vitamin (MULTIVITAMIN) tablet Take 1 tablet by mouth daily.   . mirtazapine (REMERON) 7.5 MG tablet Take 1 tablet (7.5 mg total) by mouth at bedtime.   No facility-administered encounter medications on file as of 10/25/2019.    ALLERGIES:  Allergies  Allergen Reactions  . Latex Rash  . Neomycin-Bacitracin-Polymyxin  [Bacitracin-Neomycin-Polymyxin] Rash  . Other Rash  . Fish Allergy     rash  . Neosporin [Neomycin-Polymyxin-Gramicidin] Itching and Rash  . Prednisone Hives, Itching and Rash     PHYSICAL EXAM:  ECOG Performance status: 1  Vitals:   10/25/19 0913  BP: 126/64  Pulse: 100  Resp: 18  Temp: (!) 96.6 F (35.9 C)  SpO2: 100%   Filed Weights   10/25/19 0913  Weight: 128 lb 3.2 oz (58.2 kg)    Physical Exam Constitutional:      Appearance: Normal appearance. She is normal weight.  Cardiovascular:     Rate and Rhythm: Normal rate and regular rhythm.     Heart sounds: Normal heart sounds.  Pulmonary:      Effort: Pulmonary effort is normal.     Breath sounds: Normal breath sounds.  Abdominal:     General: Bowel sounds are normal.     Palpations: Abdomen is soft.  Musculoskeletal:        General: Normal range of motion.  Skin:    General: Skin is warm.  Neurological:     Mental Status: She is alert and oriented to person, place, and time. Mental status is at baseline.  Psychiatric:        Mood and Affect: Mood normal.        Behavior: Behavior normal.  Thought Content: Thought content normal.        Judgment: Judgment normal.      LABORATORY DATA:  I have reviewed the labs as listed.  CBC    Component Value Date/Time   WBC 7.9 10/24/2019 1152   RBC 4.86 10/24/2019 1152   HGB 8.8 (L) 10/24/2019 1152   HCT 30.6 (L) 10/24/2019 1152   PLT 260 10/24/2019 1152   MCV 63.0 (L) 10/24/2019 1152   MCH 18.1 (L) 10/24/2019 1152   MCHC 28.8 (L) 10/24/2019 1152   RDW 22.2 (H) 10/24/2019 1152   LYMPHSABS 2.2 10/24/2019 1152   MONOABS 0.5 10/24/2019 1152   EOSABS 0.3 10/24/2019 1152   BASOSABS 0.1 10/24/2019 1152   CMP Latest Ref Rng & Units 08/22/2019 05/08/2019 03/24/2019  Glucose 70 - 99 mg/dL 122(H) 135(H) 112(H)  BUN 8 - 23 mg/dL 9 11 10   Creatinine 0.44 - 1.00 mg/dL 1.35(H) 1.26(H) 1.38(H)  Sodium 135 - 145 mmol/L 137 133(L) 140  Potassium 3.5 - 5.1 mmol/L 4.2 3.7 4.4  Chloride 98 - 111 mmol/L 102 99 104  CO2 22 - 32 mmol/L 27 23 28   Calcium 8.9 - 10.3 mg/dL 9.3 9.1 9.3  Total Protein 6.5 - 8.1 g/dL 7.4 7.8 7.0  Total Bilirubin 0.3 - 1.2 mg/dL 0.5 0.5 0.5  Alkaline Phos 38 - 126 U/L 109 115 -  AST 15 - 41 U/L 12(L) 11(L) 7(L)  ALT 0 - 44 U/L 8 8 4(L)     I personally performed a face-to-face visit,   All questions were answered to patient's stated satisfaction. Encouraged patient to call with any new concerns or questions before his next visit to the cancer center and we can certain see him sooner, if needed.     ASSESSMENT & PLAN:   Anemia 1.  Microcytic  anemia: -Last colonoscopy was in June 2019 in [place], 3 polyps removed. -Etiology is from combination of CKD and relative iron deficiency. -Initial work-up included SPEP which was negative.  Stool was negative for occult blood.  Hemoglobin electrophoresis showed severe microcytosis related to her anemia.  She could have underlying thalassemia/sickle cell trait. -She has a history of needing a blood transfusion in the early 2000's. -She denies any bright red bleeding per rectum or melena. -If there is further worsening of anemia with hemoglobin approaching 8, we will consider erythropoiesis stimulating agents. -Labs done on 10/24/2019 showed hemoglobin 8.8. -Her nephrologist Dr. Theador Hawthorne started Epogen injections 3000 every 2 weeks. -She will follow-up in 4 weeks with repeat labs.  2.  CKD: -Ultrasound the kidneys on 08/22/2019 showed bilateral renal atrophy, severe atrophy of the right kidney. -Creatinine stable around 1.35. -Dr.Bhutani started Epogen injections every 2 weeks 3000 units. -She follows up with nephrology.  3.  123456 and folic acid deficiency: -She is taking folic acid 1 mg daily -She is doing B12 injections monthly.      Orders placed this encounter:  Orders Placed This Encounter  Procedures  . Lactate dehydrogenase  . CBC with Differential/Platelet  . Comprehensive metabolic panel  . Ferritin  . Iron and TIBC  . Vitamin B12  . VITAMIN D 25 Hydroxy (Vit-D Deficiency, Fractures)  . Folate      Francene Finders, FNP-C [place] [phone]

## 2019-10-25 NOTE — Assessment & Plan Note (Addendum)
1.  Microcytic anemia: -Last colonoscopy was in June 2019 in [place], 3 polyps removed. -Etiology is from combination of CKD and relative iron deficiency. -Initial work-up included SPEP which was negative.  Stool was negative for occult blood.  Hemoglobin electrophoresis showed severe microcytosis related to her anemia.  She could have underlying thalassemia/sickle cell trait. -She has a history of needing a blood transfusion in the early 2000's. -She denies any bright red bleeding per rectum or melena. -If there is further worsening of anemia with hemoglobin approaching 8, we will consider erythropoiesis stimulating agents. -Labs done on 10/24/2019 showed hemoglobin 8.8. -Her nephrologist Dr. Theador Hawthorne started Epogen injections 3000 every 2 weeks. -She will follow-up in 4 weeks with repeat labs.  2.  CKD: -Ultrasound the kidneys on 08/22/2019 showed bilateral renal atrophy, severe atrophy of the right kidney. -Creatinine stable around 1.35. -Dr.Bhutani started Epogen injections every 2 weeks 3000 units. -She follows up with nephrology.  3.  123456 and folic acid deficiency: -She is taking folic acid 1 mg daily -She is doing B12 injections monthly.

## 2019-10-30 ENCOUNTER — Encounter: Payer: Self-pay | Admitting: Family Medicine

## 2019-10-30 ENCOUNTER — Ambulatory Visit (INDEPENDENT_AMBULATORY_CARE_PROVIDER_SITE_OTHER): Payer: Medicare Other | Admitting: Family Medicine

## 2019-10-30 ENCOUNTER — Other Ambulatory Visit: Payer: Self-pay

## 2019-10-30 ENCOUNTER — Encounter (INDEPENDENT_AMBULATORY_CARE_PROVIDER_SITE_OTHER): Payer: Self-pay | Admitting: Gastroenterology

## 2019-10-30 VITALS — BP 104/65 | HR 100 | Temp 97.6°F | Ht 64.0 in | Wt 126.4 lb

## 2019-10-30 DIAGNOSIS — M810 Age-related osteoporosis without current pathological fracture: Secondary | ICD-10-CM

## 2019-10-30 DIAGNOSIS — I10 Essential (primary) hypertension: Secondary | ICD-10-CM | POA: Diagnosis not present

## 2019-10-30 DIAGNOSIS — R112 Nausea with vomiting, unspecified: Secondary | ICD-10-CM | POA: Diagnosis not present

## 2019-10-30 DIAGNOSIS — R5383 Other fatigue: Secondary | ICD-10-CM | POA: Diagnosis not present

## 2019-10-30 DIAGNOSIS — D649 Anemia, unspecified: Secondary | ICD-10-CM

## 2019-10-30 MED ORDER — OMEPRAZOLE 40 MG PO CPDR: 40.0000 mg | DELAYED_RELEASE_CAPSULE | Freq: Every day | ORAL | 3 refills | Status: DC

## 2019-10-30 NOTE — Progress Notes (Signed)
Established Patient Office Visit  Subjective:  Patient ID: Traci Parker, female    DOB: [birth year]  Age: 74 y.o. MRN: [id number]  CC:  Chief Complaint  Patient presents with  . Follow-up    here to f/u on bone density results   . Emesis    also would liek to get a [place] to find out why im vomiting and weight loss.    HPI ELLSA CEBULSKI presents for discussion about DEXA scan-osteoporosis-previously osteopenia-taking Ca +Vit D. NO medications in the past for nausea  4/21-nausea and vomiting after taking COVID vaccine. Pt states vomiting when she trying to eat.  Pt states she is not vomiting every day. No diarrhea.  No acute illness. Pt with fatigue noted over the past few months. Pt states nausea after COVID fatigue with worsening weakness. NO fever. Pt has not taken otc medication. Oncology recommended Remeron at night 4/14-pt states no improvement. Being treated for anemia by oncology and nephrology.   Past Medical History:  Diagnosis Date  . Allergy   . Anemia   . Asthma   . Blood transfusion without reported diagnosis   . Diabetes mellitus without complication ([place])   . Glaucoma   . History of blood in urine   . Hypertension   . Osteoporosis     Past Surgical History:  Procedure Laterality Date  . CHOLECYSTECTOMY  2008  . COLONOSCOPY  04/30/2011   Procedure: COLONOSCOPY;  Surgeon: Rogene Houston, MD;  Location: [facility];  Service: Endoscopy;  Laterality: N/A;  . COLONOSCOPY  05/25/2012   Procedure: COLONOSCOPY;  Surgeon: Rogene Houston, MD;  Location: [facility];  Service: Endoscopy;  Laterality: N/A;  1:25-changed to 1200 Ann to notify pt  . COLONOSCOPY Bilateral 12/2017  . GALLBLADDER SURGERY    . right breast cystectomy  1988  . TUBAL LIGATION  1975    Family History  Problem Relation Age of Onset  . Diabetes Mother   . Hyperlipidemia Mother   . Hypertension Mother   . Heart disease Father   . Hyperlipidemia Father   . Diabetes Sister   .  Hyperlipidemia Sister   . Hypertension Sister   . Cancer Brother   . Heart disease Brother   . Hyperlipidemia Brother   . Hypertension Brother   . Diabetes Sister     Social History   Socioeconomic History  . Marital status: Divorced    Spouse name: Not on file  . Number of children: 4  . Years of education: Not on file  . Highest education level: Not on file  Occupational History  . Not on file  Tobacco Use  . Smoking status: Former Smoker    Packs/day: 1.00    Years: 40.00    Pack years: 40.00    Quit date: 04/03/2011    Years since quitting: 8.5  . Smokeless tobacco: Never Used  Substance and Sexual Activity  . Alcohol use: No  . Drug use: No  . Sexual activity: Not Currently  Other Topics Concern  . Not on file  Social History Narrative  . Not on file   Social Determinants of Health   Financial Resource Strain:   . Difficulty of Paying Living Expenses:   Food Insecurity:   . Worried About Charity fundraiser in the Last Year:   . Arboriculturist in the Last Year:   Transportation Needs:   . Film/video editor (Medical):   Marland Kitchen Lack of  Transportation (Non-Medical):   Physical Activity:   . Days of Exercise per Week:   . Minutes of Exercise per Session:   Stress:   . Feeling of Stress :   Social Connections:   . Frequency of Communication with Friends and Family:   . Frequency of Social Gatherings with Friends and Family:   . Attends Religious Services:   . Active Member of Clubs or Organizations:   . Attends Archivist Meetings:   Marland Kitchen Marital Status:   Intimate Partner Violence:   . Fear of Current or Ex-Partner:   . Emotionally Abused:   Marland Kitchen Physically Abused:   . Sexually Abused:     Outpatient Medications Prior to Visit  Medication Sig Dispense Refill  . amLODipine (NORVASC) 5 MG tablet Take 2 tablets (10 mg total) by mouth daily. 180 tablet 1  . atorvastatin (LIPITOR) 10 MG tablet Take 1 tablet (10 mg total) by mouth daily. 90 tablet 1   . calcium-vitamin D (OSCAL WITH D) 500-200 MG-UNIT tablet Take 1 tablet by mouth daily.    . cyanocobalamin (,VITAMIN B-12,) 1000 MCG/ML injection Inject 1 mL (1,000 mcg total) into the muscle every 30 (thirty) days. 30 mL 0  . dorzolamide (TRUSOPT) 2 % ophthalmic solution Place 1 drop into both eyes 2 (two) times daily.    Marland Kitchen epoetin alfa (EPOGEN) 3000 UNIT/ML injection Inject 3,000 Units into the vein every 14 (fourteen) days.    . folic acid (FOLVITE) 1 MG tablet Take 1 tablet (1 mg total) by mouth daily. 30 tablet 6  . latanoprost (XALATAN) 0.005 % ophthalmic solution Place 1 drop into both eyes at bedtime.    . mirtazapine (REMERON) 7.5 MG tablet Take 1 tablet (7.5 mg total) by mouth at bedtime. 60 tablet 1  . Multiple Vitamin (MULTIVITAMIN) tablet Take 1 tablet by mouth daily.      No facility-administered medications prior to visit.    Allergies  Allergen Reactions  . Latex Rash  . Neomycin-Bacitracin-Polymyxin  [Bacitracin-Neomycin-Polymyxin] Rash  . Other Rash  . Fish Allergy     rash  . Neosporin [Neomycin-Polymyxin-Gramicidin] Itching and Rash  . Prednisone Hives, Itching and Rash    ROS Review of Systems  Constitutional: Positive for fatigue. Negative for fever.  HENT: Positive for congestion.   Respiratory: Negative.   Cardiovascular: Negative.   Gastrointestinal: Positive for nausea and vomiting. Negative for abdominal distention, abdominal pain, blood in stool and diarrhea.  Neurological: Positive for weakness.  Hematological:       Anemia      Objective:    Physical Exam  Constitutional: She is oriented to person, place, and time. She appears well-developed and well-nourished.  HENT:  Head: Normocephalic and atraumatic.  Cardiovascular: Normal rate, regular rhythm, normal heart sounds and intact distal pulses.  Pulmonary/Chest: Effort normal and breath sounds normal.  Abdominal: Soft. She exhibits no distension. There is no abdominal tenderness.   Neurological: She is alert and oriented to person, place, and time.  Psychiatric: She has a normal mood and affect. Her behavior is normal.    BP 104/65 (BP Location: Right Arm, Patient Position: Sitting, Cuff Size: Normal)   Pulse 100   Temp 97.6 F (36.4 C) (Temporal)   Ht 5\' 4"  (1.626 m)   Wt 126 lb 6.4 oz (57.3 kg)   SpO2 95%   BMI 21.70 kg/m  Wt Readings from Last 3 Encounters:  10/30/19 126 lb 6.4 oz (57.3 kg)  10/25/19 128 lb 3.2 oz (58.2  kg)  10/24/19 132 lb 11.5 oz (60.2 kg)     Health Maintenance Due  Topic Date Due  . COVID-19 Vaccine (1) Never done  . PNA vac Low Risk Adult (2 of 2 - PCV13) 09/25/2018    No results found for: TSH Lab Results  Component Value Date   WBC 7.9 10/24/2019   HGB 8.8 (L) 10/24/2019   HCT 30.6 (L) 10/24/2019   MCV 63.0 (L) 10/24/2019   PLT 260 10/24/2019   Lab Results  Component Value Date   NA 137 08/22/2019   K 4.2 08/22/2019   CO2 27 08/22/2019   GLUCOSE 122 (H) 08/22/2019   BUN 9 08/22/2019   CREATININE 1.35 (H) 08/22/2019   BILITOT 0.5 08/22/2019   ALKPHOS 109 08/22/2019   AST 12 (L) 08/22/2019   ALT 8 08/22/2019   PROT 7.4 08/22/2019   ALBUMIN 4.2 08/22/2019   CALCIUM 9.3 08/22/2019   ANIONGAP 8 08/22/2019   Lab Results  Component Value Date   CHOL 149 03/24/2019   Lab Results  Component Value Date   HDL 43 (L) 03/24/2019   Lab Results  Component Value Date   LDLCALC 87 03/24/2019   Lab Results  Component Value Date   TRIG 93 03/24/2019   Lab Results  Component Value Date   CHOLHDL 3.5 03/24/2019   Lab Results  Component Value Date   HGBA1C 6.0 (A) 09/20/2019      Assessment & Plan:  1. Nausea and vomiting, intractability of vomiting not specified, unspecified vomiting type Pt with vomiting when she tries to eat. No h/o of ulcer-2 weeks n/v-no blood. NO trial of otc meds-omeprazole daily. Shakes-small amounts - Ambulatory referral to Gastroenterology  2. Hypertension, unspecified  type Norvasc  3. Anemia, unspecified type Oncology /nephro following-worsening  4. Osteoporosis, unspecified osteoporosis type, unspecified pathological fracture presence Pt declined medication-tired of shots, not interested in infusion, concern about pills with nausea currently. D/w pt results of DEXA-copy of report given to pt  5. Fatigue, unspecified type Anemia-additional blood work pending Follow-up: oncology, GI    LISA Hannah Beat, MD

## 2019-10-30 NOTE — Patient Instructions (Addendum)
Take omeprazole daily-1 hour prior to your largest meal GI referral for evaluation Consider medication for osteoporosis Nausea, Adult Nausea is feeling sick to your stomach or feeling that you are about to throw up (vomit). Feeling sick to your stomach is usually not serious, but it may be an early sign of a more serious medical problem. As you feel sicker to your stomach, you may throw up. If you throw up, or if you are not able to drink enough fluids, there is a risk that you may lose too much water in your body (get dehydrated). If you lose too much water in your body, you may:  Feel tired.  Feel thirsty.  Have a dry mouth.  Have cracked lips.  Go pee (urinate) less often. Older adults and people who have other diseases or a weak body defense system (immune system) have a higher risk of losing too much water in the body. The main goals of treating this condition are:  To relieve your nausea.  To ensure your nausea occurs less often.  To prevent throwing up and losing too much fluid. Follow these instructions at home: Watch your symptoms for any changes. Tell your doctor about them. Follow these instructions as told by your doctor. Eating and drinking      Take an ORS (oral rehydration solution). This is a drink that is sold at pharmacies and stores.  Drink clear fluids in small amounts as you are able. These include: ? Water. ? Ice chips. ? Fruit juice that has water added (diluted fruit juice). ? Low-calorie sports drinks.  Eat bland, easy-to-digest foods in small amounts as you are able, such as: ? Bananas. ? Applesauce. ? Rice. ? Low-fat (lean) meats. ? Toast. ? Crackers.  Avoid drinking fluids that have a lot of sugar or caffeine in them. This includes energy drinks, sports drinks, and soda.  Avoid alcohol.  Avoid spicy or fatty foods. General instructions  Take over-the-counter and prescription medicines only as told by your doctor.  Rest at home  while you get better.  Drink enough fluid to keep your pee (urine) pale yellow.  Take slow and deep breaths when you feel sick to your stomach.  Avoid food or things that have strong smells.  Wash your hands often with soap and water. If you cannot use soap and water, use hand sanitizer.  Make sure that all people in your home wash their hands well and often.  Keep all follow-up visits as told by your doctor. This is important. Contact a doctor if:  You feel sicker to your stomach.  You feel sick to your stomach for more than 2 days.  You throw up.  You are not able to drink fluids without throwing up.  You have new symptoms.  You have a fever.  You have a headache.  You have muscle cramps.  You have a rash.  You have pain while peeing.  You feel light-headed or dizzy. Get help right away if:  You have pain in your chest, neck, arm, or jaw.  You feel very weak or you pass out (faint).  You have throw up that is bright red or looks like coffee grounds.  You have bloody or black poop (stools) or poop that looks like tar.  You have a very bad headache, a stiff neck, or both.  You have very bad pain, cramping, or bloating in your belly (abdomen).  You have trouble breathing or you are breathing very quickly.  Your  heart is beating very quickly.  Your skin feels cold and clammy.  You feel confused.  You have signs of losing too much water in your body, such as: ? Dark pee, very little pee, or no pee. ? Cracked lips. ? Dry mouth. ? Sunken eyes. ? Sleepiness. ? Weakness. These symptoms may be an emergency. Do not wait to see if the symptoms will go away. Get medical help right away. Call your local emergency services (911 in the [place] Do not drive yourself to the hospital. Summary  Nausea is feeling sick to your stomach or feeling that you are about to throw up (vomit).  If you throw up, or if you are not able to drink enough fluids, there is a risk  that you may lose too much water in your body (get dehydrated).  Eat and drink what your doctor tells you. Take over-the-counter and prescription medicines only as told by your doctor.  Contact a doctor right away if your symptoms get worse or you have new symptoms.  Keep all follow-up visits as told by your doctor. This is important. This information is not intended to replace advice given to you by your health care provider. Make sure you discuss any questions you have with your health care provider. Document Revised: 12/07/2017 Document Reviewed: 12/07/2017 Elsevier Patient Education  [place].  Osteoporosis  Osteoporosis happens when your bones get thin and weak. This can cause your bones to break (fracture) more easily. You can do things at home to make your bones stronger. Follow these instructions at home:  Activity  Exercise as told by your doctor. Ask your doctor what activities are safe for you. You should do: ? Exercises that make your muscles work to hold your body weight up (weight-bearing exercises). These include tai chi, yoga, and walking. ? Exercises to make your muscles stronger. One example is lifting weights. Lifestyle  Limit alcohol intake to no more than 1 drink a day for nonpregnant women and 2 drinks a day for men. One drink equals 12 oz of beer, 5 oz of wine, or 1 oz of hard liquor.  Do not use any products that have nicotine or tobacco in them. These include cigarettes and e-cigarettes. If you need help quitting, ask your doctor. Preventing falls  Use tools to help you move around (mobility aids) as needed. These include canes, walkers, scooters, and crutches.  Keep rooms well-lit and free of clutter.  Put away things that could make you trip. These include cords and rugs.  Install safety rails on stairs. Install grab bars in bathrooms.  Use rubber mats in slippery areas, like bathrooms.  Wear shoes that: ? Fit you well. ? Support your  feet. ? Have closed toes. ? Have rubber soles or low heels.  Tell your doctor about all of the medicines you are taking. Some medicines can make you more likely to fall. General instructions  Eat plenty of calcium and vitamin D. These nutrients are good for your bones. Good sources of calcium and vitamin D include: ? Some fatty fish, such as salmon and tuna. ? Foods that have calcium and vitamin D added to them (fortified foods). For example, some breakfast cereals are fortified with calcium and vitamin D. ? Egg yolks. ? Cheese. ? Liver.  Take over-the-counter and prescription medicines only as told by your doctor.  Keep all follow-up visits as told by your doctor. This is important. Contact a doctor if:  You have not been tested (  screened) for osteoporosis and you are: ? A woman who is age 33 or older. ? A man who is age 16 or older. Get help right away if:  You fall.  You get hurt. Summary  Osteoporosis happens when your bones get thin and weak.  Weak bones can break (fracture) more easily.  Eat plenty of calcium and vitamin D. These nutrients are good for your bones.  Tell your doctor about all of the medicines that you take. This information is not intended to replace advice given to you by your health care provider. Make sure you discuss any questions you have with your health care provider. Document Revised: 06/11/2017 Document Reviewed: 04/23/2017 Elsevier Patient Education  [employer].    If you have lab work done today you will be contacted with your lab results within the next 2 weeks.  If you have not heard from [place] then please contact us. The fastest way to get your results is to register for My Chart.   IF you received an x-ray today, you will receive an invoice from [facility]. Please contact [facility] at [phone] with questions or concerns regarding your invoice.   IF you received labwork today, you will receive an invoice  from [facility]. Please contact [facility] at [phone] with questions or concerns regarding your invoice.   Our billing staff will not be able to assist you with questions regarding bills from these companies.  You will be contacted with the lab results as soon as they are available. The fastest way to get your results is to activate your My Chart account. Instructions are located on the last page of this paperwork. If you have not heard from [place] regarding the results in 2 weeks, please contact this office.

## 2019-10-31 ENCOUNTER — Other Ambulatory Visit (HOSPITAL_COMMUNITY): Payer: Self-pay | Admitting: *Deleted

## 2019-10-31 ENCOUNTER — Encounter (HOSPITAL_COMMUNITY): Payer: Self-pay | Admitting: *Deleted

## 2019-10-31 ENCOUNTER — Ambulatory Visit (HOSPITAL_COMMUNITY): Payer: Medicare Other | Admitting: Hematology

## 2019-10-31 MED ORDER — ONDANSETRON 4 MG PO TBDP: 4.0000 mg | ORAL_TABLET | Freq: Three times a day (TID) | ORAL | 2 refills | Status: DC | PRN

## 2019-10-31 NOTE — Progress Notes (Signed)
Patient called clinic to report that she has been nauseated since taking the remeron.  She has been vomiting today.  Per Francene Finders, NP patient is to take zofran 4 mg dissolve under tongue 30 minutes before taking the remeron and then as needed during the day.  If this doesn't stop the nausea, patient is aware that she may have to stop taking the remeron.  She verbalizes understanding and will let us know.

## 2019-11-07 ENCOUNTER — Encounter (HOSPITAL_COMMUNITY)
Admission: RE | Admit: 2019-11-07 | Discharge: 2019-11-07 | Disposition: A | Discharge status: Home / Self Care | Payer: Medicare Other | Source: Ambulatory Visit | Attending: Nephrology | Admitting: Nephrology

## 2019-11-07 ENCOUNTER — Other Ambulatory Visit: Payer: Self-pay

## 2019-11-07 ENCOUNTER — Encounter (HOSPITAL_COMMUNITY): Payer: Self-pay

## 2019-11-07 DIAGNOSIS — N1832 Chronic kidney disease, stage 3b: Secondary | ICD-10-CM | POA: Diagnosis not present

## 2019-11-07 DIAGNOSIS — D631 Anemia in chronic kidney disease: Secondary | ICD-10-CM | POA: Diagnosis not present

## 2019-11-07 LAB — POCT HEMOGLOBIN-HEMACUE: Hemoglobin: 8.6 g/dL — ABNORMAL LOW (ref 12.0–15.0)

## 2019-11-07 MED ORDER — EPOETIN ALFA 3000 UNIT/ML IJ SOLN
3000.0000 [IU] | Freq: Once | INTRAMUSCULAR | Status: AC
  Administered 2019-11-07: 3000 [IU] via SUBCUTANEOUS
  Filled 2019-11-07: qty 1

## 2019-11-10 ENCOUNTER — Encounter (HOSPITAL_COMMUNITY): Payer: Self-pay

## 2019-11-21 ENCOUNTER — Other Ambulatory Visit: Payer: Self-pay

## 2019-11-21 ENCOUNTER — Inpatient Hospital Stay (HOSPITAL_COMMUNITY): Payer: Medicare Other

## 2019-11-21 ENCOUNTER — Inpatient Hospital Stay (HOSPITAL_COMMUNITY): Payer: Medicare Other | Attending: Nurse Practitioner | Admitting: Nurse Practitioner

## 2019-11-21 ENCOUNTER — Encounter (HOSPITAL_COMMUNITY): Payer: Self-pay | Admitting: Nurse Practitioner

## 2019-11-21 VITALS — BP 109/71 | HR 115 | Temp 96.2°F | Resp 19 | Wt 120.2 lb

## 2019-11-21 DIAGNOSIS — Z809 Family history of malignant neoplasm, unspecified: Secondary | ICD-10-CM | POA: Diagnosis not present

## 2019-11-21 DIAGNOSIS — D631 Anemia in chronic kidney disease: Secondary | ICD-10-CM

## 2019-11-21 DIAGNOSIS — N189 Chronic kidney disease, unspecified: Secondary | ICD-10-CM | POA: Diagnosis not present

## 2019-11-21 DIAGNOSIS — E538 Deficiency of other specified B group vitamins: Secondary | ICD-10-CM | POA: Diagnosis not present

## 2019-11-21 DIAGNOSIS — J45909 Unspecified asthma, uncomplicated: Secondary | ICD-10-CM

## 2019-11-21 DIAGNOSIS — Z8349 Family history of other endocrine, nutritional and metabolic diseases: Secondary | ICD-10-CM | POA: Insufficient documentation

## 2019-11-21 DIAGNOSIS — D509 Iron deficiency anemia, unspecified: Secondary | ICD-10-CM | POA: Diagnosis not present

## 2019-11-21 DIAGNOSIS — R6881 Early satiety: Secondary | ICD-10-CM | POA: Insufficient documentation

## 2019-11-21 DIAGNOSIS — Z8249 Family history of ischemic heart disease and other diseases of the circulatory system: Secondary | ICD-10-CM | POA: Diagnosis not present

## 2019-11-21 DIAGNOSIS — Z79899 Other long term (current) drug therapy: Secondary | ICD-10-CM | POA: Insufficient documentation

## 2019-11-21 DIAGNOSIS — R110 Nausea: Secondary | ICD-10-CM | POA: Diagnosis not present

## 2019-11-21 DIAGNOSIS — Z87891 Personal history of nicotine dependence: Secondary | ICD-10-CM | POA: Insufficient documentation

## 2019-11-21 DIAGNOSIS — E119 Type 2 diabetes mellitus without complications: Secondary | ICD-10-CM | POA: Insufficient documentation

## 2019-11-21 DIAGNOSIS — R630 Anorexia: Secondary | ICD-10-CM | POA: Diagnosis not present

## 2019-11-21 DIAGNOSIS — I10 Essential (primary) hypertension: Secondary | ICD-10-CM | POA: Insufficient documentation

## 2019-11-21 DIAGNOSIS — Z833 Family history of diabetes mellitus: Secondary | ICD-10-CM | POA: Diagnosis not present

## 2019-11-21 LAB — COMPREHENSIVE METABOLIC PANEL
ALT: 8 U/L (ref 0–44)
AST: 12 U/L — ABNORMAL LOW (ref 15–41)
Albumin: 4.1 g/dL (ref 3.5–5.0)
Alkaline Phosphatase: 99 U/L (ref 38–126)
Anion gap: 12 (ref 5–15)
BUN: 11 mg/dL (ref 8–23)
CO2: 25 mmol/L (ref 22–32)
Calcium: 9.2 mg/dL (ref 8.9–10.3)
Chloride: 99 mmol/L (ref 98–111)
Creatinine, Ser: 1.5 mg/dL — ABNORMAL HIGH (ref 0.44–1.00)
GFR calc Af Amer: 39 mL/min — ABNORMAL LOW (ref >60)
GFR calc non Af Amer: 34 mL/min — ABNORMAL LOW (ref >60)
Glucose, Bld: 131 mg/dL — ABNORMAL HIGH (ref 70–99)
Potassium: 3.8 mmol/L (ref 3.5–5.1)
Sodium: 136 mmol/L (ref 135–145)
Total Bilirubin: 0.7 mg/dL (ref 0.3–1.2)
Total Protein: 7.5 g/dL (ref 6.5–8.1)

## 2019-11-21 LAB — CBC WITH DIFFERENTIAL/PLATELET
Abs Immature Granulocytes: 0.03 10*3/uL (ref 0.00–0.07)
Basophils Absolute: 0.1 10*3/uL (ref 0.0–0.1)
Basophils Relative: 1 %
Eosinophils Absolute: 0.3 10*3/uL (ref 0.0–0.5)
Eosinophils Relative: 4 %
HCT: 31.1 % — ABNORMAL LOW (ref 36.0–46.0)
Hemoglobin: 9 g/dL — ABNORMAL LOW (ref 12.0–15.0)
Immature Granulocytes: 0 %
Lymphocytes Relative: 29 %
Lymphs Abs: 2.2 10*3/uL (ref 0.7–4.0)
MCH: 17.7 pg — ABNORMAL LOW (ref 26.0–34.0)
MCHC: 28.9 g/dL — ABNORMAL LOW (ref 30.0–36.0)
MCV: 61.1 fL — ABNORMAL LOW (ref 80.0–100.0)
Monocytes Absolute: 0.4 10*3/uL (ref 0.1–1.0)
Monocytes Relative: 5 %
Neutro Abs: 4.5 10*3/uL (ref 1.7–7.7)
Neutrophils Relative %: 61 %
Platelets: 283 10*3/uL (ref 150–400)
RBC: 5.09 MIL/uL (ref 3.87–5.11)
RDW: 22.5 % — ABNORMAL HIGH (ref 11.5–15.5)
WBC: 7.4 10*3/uL (ref 4.0–10.5)
nRBC: 0 % (ref 0.0–0.2)

## 2019-11-21 LAB — IRON AND TIBC
Iron: 92 ug/dL (ref 28–170)
Saturation Ratios: 50 % — ABNORMAL HIGH (ref 10.4–31.8)
TIBC: 185 ug/dL — ABNORMAL LOW (ref 250–450)
UIBC: 93 ug/dL

## 2019-11-21 LAB — LACTATE DEHYDROGENASE: LDH: 112 U/L (ref 98–192)

## 2019-11-21 LAB — VITAMIN B12: Vitamin B-12: 1063 pg/mL — ABNORMAL HIGH (ref 180–914)

## 2019-11-21 LAB — FERRITIN: Ferritin: 175 ng/mL (ref 11–307)

## 2019-11-21 LAB — FOLATE: Folate: 37.5 ng/mL (ref >5.9)

## 2019-11-21 MED ORDER — ALBUTEROL SULFATE HFA 108 (90 BASE) MCG/ACT IN AERS: 2.0000 | INHALATION_SPRAY | RESPIRATORY_TRACT | 2 refills | Status: DC | PRN

## 2019-11-21 MED ORDER — ONDANSETRON 4 MG PO TBDP: 4.0000 mg | ORAL_TABLET | Freq: Three times a day (TID) | ORAL | 2 refills | Status: DC | PRN

## 2019-11-21 NOTE — Patient Instructions (Signed)
[facility] Discharge Instructions  Follow up in 2 months with labs    Thank you for choosing  [facility] at [facility] to provide your oncology and hematology care.  To afford each patient quality time with our provider, please arrive at least 15 minutes before your scheduled appointment time.   If you have a lab appointment with the Cancer Center please come in thru the Main Entrance and check in at the main information desk.  You need to re-schedule your appointment should you arrive 10 or more minutes late.  We strive to give you quality time with our providers, and arriving late affects you and other patients whose appointments are after yours.  Also, if you no show three or more times for appointments you may be dismissed from the clinic at the providers discretion.     Again, thank you for choosing [facility].  Our hope is that these requests will decrease the amount of time that you wait before being seen by our physicians.       _____________________________________________________________  Should you have questions after your visit to [facility], please contact our office at [phone] between the hours of 8:00 a.m. and 4:30 p.m.  Voicemails left after 4:00 p.m. will not be returned until the following business day.  For prescription refill requests, have your pharmacy contact our office and allow 72 hours.    Due to Covid, you will need to wear a mask upon entering the hospital. If you do not have a mask, a mask will be given to you at the Main Entrance upon arrival. For doctor visits, patients may have 1 support person with them. For treatment visits, patients can not have anyone with them due to social distancing guidelines and our immunocompromised population.      

## 2019-11-21 NOTE — Assessment & Plan Note (Signed)
1.  Microcytic anemia: -Last colonoscopy was in June 2019 in [place], 3 polyps removed. -Etiology is from combination of CKD and relative iron deficiency. -Initial work-up included SPEP which was negative.  Stool was negative for occult blood.  Hemoglobin electrophoresis showed severe microcytosis related to her anemia.  She could have underlying thalassemia/sickle cell trait. -She has a history of needing a blood transfusion in the early 2000's. -She denies any bright red bleeding per rectum or melena. -If there is further worsening of anemia with hemoglobin approaching 8, we will consider erythropoiesis stimulating agents. -Labs done on 11/21/2019 showed hemoglobin 9.0. -Her nephrologist Dr. Theador Hawthorne started Epogen injections 3000 every 2 weeks. -She will follow-up in 8 weeks with repeat labs.  2.  CKD: -Ultrasound the kidneys on 08/22/2019 showed bilateral renal atrophy, severe atrophy of the right kidney. -Labs done on 11/21/2019 showed creatinine 1.5 -Dr.Bhutani started Epogen injections every 2 weeks 3000 units. -She follows up with nephrology next week.  3.  123456 and folic acid deficiency: -She is taking folic acid 1 mg daily -She is doing B12 injections monthly.

## 2019-11-21 NOTE — Progress Notes (Signed)
Traci Parker, [place] [id number]   CLINIC:  Medical Oncology/Hematology  PCP:  Maryruth Hancock, [place] [id number] [phone]   REASON FOR VISIT: Follow-up for anemia   CURRENT THERAPY: Procrit injections every 2 weeks given by nephrology   INTERVAL HISTORY:  Traci Parker 74 y.o. female returns for routine follow-up for anemia.  Patient reports she is doing well since her last visit.  She does have some shortness of breath with exertion.  She does complain of fatigue throughout the day.  She denies any bright red bleeding per rectum or melena.  She does also report early satiety.  She still has nausea occasionally however her nausea medication is helping. Denies any vomiting or diarrhea. Denies any new pains. Had not noticed any recent bleeding such as epistaxis, hematuria or hematochezia. Denies recent chest pain on exertion, pre-syncopal episodes, or palpitations. Denies any numbness or tingling in hands or feet. Denies any recent fevers, infections, or recent hospitalizations. Patient reports appetite at 25% and energy level at 25%.    REVIEW OF SYSTEMS:  Review of Systems  Constitutional: Positive for fatigue.  Respiratory: Positive for shortness of breath.   Gastrointestinal: Positive for nausea.  Neurological: Positive for headaches.  Psychiatric/Behavioral: Positive for depression and sleep disturbance.  All other systems reviewed and are negative.    PAST MEDICAL/SURGICAL HISTORY:  Past Medical History:  Diagnosis Date  . Allergy   . Anemia   . Asthma   . Blood transfusion without reported diagnosis   . Diabetes mellitus without complication ([place])   . Glaucoma   . History of blood in urine   . Hypertension   . Osteoporosis    Past Surgical History:  Procedure Laterality Date  . CHOLECYSTECTOMY  2008  . COLONOSCOPY  04/30/2011   Procedure: COLONOSCOPY;  Surgeon: Rogene Houston, MD;  [facility];   Service: Endoscopy;  Laterality: N/A;  . COLONOSCOPY  05/25/2012   Procedure: COLONOSCOPY;  Surgeon: Rogene Houston, MD;  Location: [facility];  Service: Endoscopy;  Laterality: N/A;  1:25-changed to 1200 Ann to notify pt  . COLONOSCOPY Bilateral 12/2017  . GALLBLADDER SURGERY    . right breast cystectomy  1988  . TUBAL LIGATION  1975     SOCIAL HISTORY:  Social History   Socioeconomic History  . Marital status: Divorced    Spouse name: Not on file  . Number of children: 4  . Years of education: Not on file  . Highest education level: Not on file  Occupational History  . Not on file  Tobacco Use  . Smoking status: Former Smoker    Packs/day: 1.00    Years: 40.00    Pack years: 40.00    Quit date: 04/03/2011    Years since quitting: 8.6  . Smokeless tobacco: Never Used  Substance and Sexual Activity  . Alcohol use: No  . Drug use: No  . Sexual activity: Not Currently  Other Topics Concern  . Not on file  Social History Narrative  . Not on file   Social Determinants of Health   Financial Resource Strain:   . Difficulty of Paying Living Expenses:   Food Insecurity:   . Worried About Charity fundraiser in the Last Year:   . Arboriculturist in the Last Year:   Transportation Needs:   . Film/video editor (Medical):   Marland Kitchen Lack of Transportation (Non-Medical):  Physical Activity:   . Days of Exercise per Week:   . Minutes of Exercise per Session:   Stress:   . Feeling of Stress :   Social Connections:   . Frequency of Communication with Friends and Family:   . Frequency of Social Gatherings with Friends and Family:   . Attends Religious Services:   . Active Member of Clubs or Organizations:   . Attends Archivist Meetings:   Marland Kitchen Marital Status:   Intimate Partner Violence:   . Fear of Current or Ex-Partner:   . Emotionally Abused:   Marland Kitchen Physically Abused:   . Sexually Abused:     FAMILY HISTORY:  Family History  Problem Relation Age of Onset   . Diabetes Mother   . Hyperlipidemia Mother   . Hypertension Mother   . Heart disease Father   . Hyperlipidemia Father   . Diabetes Sister   . Hyperlipidemia Sister   . Hypertension Sister   . Cancer Brother   . Heart disease Brother   . Hyperlipidemia Brother   . Hypertension Brother   . Diabetes Sister     CURRENT MEDICATIONS:  Outpatient Encounter Medications as of 11/21/2019  Medication Sig  . amLODipine (NORVASC) 5 MG tablet Take 2 tablets (10 mg total) by mouth daily.  Marland Kitchen atorvastatin (LIPITOR) 10 MG tablet Take 1 tablet (10 mg total) by mouth daily.  . calcium-vitamin D (OSCAL WITH D) 500-200 MG-UNIT tablet Take 1 tablet by mouth daily.  . cyanocobalamin (,VITAMIN B-12,) 1000 MCG/ML injection Inject 1 mL (1,000 mcg total) into the muscle every 30 (thirty) days.  . dorzolamide (TRUSOPT) 2 % ophthalmic solution Place 1 drop into both eyes 2 (two) times daily.  Marland Kitchen epoetin alfa (EPOGEN) 3000 UNIT/ML injection Inject 3,000 Units into the vein every 14 (fourteen) days.  . folic acid (FOLVITE) 1 MG tablet Take 1 tablet (1 mg total) by mouth daily.  Marland Kitchen latanoprost (XALATAN) 0.005 % ophthalmic solution Place 1 drop into both eyes at bedtime.  . Multiple Vitamin (MULTIVITAMIN) tablet Take 1 tablet by mouth daily.   Marland Kitchen omeprazole (PRILOSEC) 40 MG capsule Take 1 capsule (40 mg total) by mouth daily.  Marland Kitchen albuterol (VENTOLIN HFA) 108 (90 Base) MCG/ACT inhaler Inhale 2 puffs into the lungs every 4 (four) hours as needed for wheezing or shortness of breath.  . ondansetron (ZOFRAN ODT) 4 MG disintegrating tablet Take 1 tablet (4 mg total) by mouth every 8 (eight) hours as needed for nausea or vomiting. Dissolve under tongue  . [DISCONTINUED] mirtazapine (REMERON) 7.5 MG tablet Take 1 tablet (7.5 mg total) by mouth at bedtime.  . [DISCONTINUED] ondansetron (ZOFRAN ODT) 4 MG disintegrating tablet Take 1 tablet (4 mg total) by mouth every 8 (eight) hours as needed for nausea or vomiting. Dissolve under  tongue (Patient not taking: Reported on 11/21/2019)   No facility-administered encounter medications on file as of 11/21/2019.    ALLERGIES:  Allergies  Allergen Reactions  . Latex Rash  . Neomycin-Bacitracin-Polymyxin  [Bacitracin-Neomycin-Polymyxin] Rash  . Other Rash  . Fish Allergy     rash  . Neosporin [Neomycin-Polymyxin-Gramicidin] Itching and Rash  . Prednisone Hives, Itching and Rash     PHYSICAL EXAM:  ECOG Performance status: 1  Vitals:   11/21/19 0940  BP: 109/71  Pulse: (!) 115  Resp: 19  Temp: (!) 96.2 F (35.7 C)  SpO2: 100%   Filed Weights   11/21/19 0940  Weight: 120 lb 3.2 oz (54.5 kg)  Physical Exam Constitutional:      Appearance: Normal appearance. She is normal weight.  Abdominal:     General: Bowel sounds are normal.     Palpations: Abdomen is soft.  Musculoskeletal:        General: Normal range of motion.  Skin:    General: Skin is warm.  Neurological:     Mental Status: She is alert and oriented to person, place, and time. Mental status is at baseline.  Psychiatric:        Mood and Affect: Mood normal.        Behavior: Behavior normal.        Thought Content: Thought content normal.        Judgment: Judgment normal.      LABORATORY DATA:  I have reviewed the labs as listed.  CBC    Component Value Date/Time   WBC 7.4 11/21/2019 0851   RBC 5.09 11/21/2019 0851   HGB 9.0 (L) 11/21/2019 0851   HCT 31.1 (L) 11/21/2019 0851   PLT 283 11/21/2019 0851   MCV 61.1 (L) 11/21/2019 0851   MCH 17.7 (L) 11/21/2019 0851   MCHC 28.9 (L) 11/21/2019 0851   RDW 22.5 (H) 11/21/2019 0851   LYMPHSABS 2.2 11/21/2019 0851   MONOABS 0.4 11/21/2019 0851   EOSABS 0.3 11/21/2019 0851   BASOSABS 0.1 11/21/2019 0851   CMP Latest Ref Rng & Units 11/21/2019 08/22/2019 05/08/2019  Glucose 70 - 99 mg/dL 131(H) 122(H) 135(H)  BUN 8 - 23 mg/dL 11 9 11   Creatinine 0.44 - 1.00 mg/dL 1.50(H) 1.35(H) 1.26(H)  Sodium 135 - 145 mmol/L 136 137 133(L)    Potassium 3.5 - 5.1 mmol/L 3.8 4.2 3.7  Chloride 98 - 111 mmol/L 99 102 99  CO2 22 - 32 mmol/L 25 27 23   Calcium 8.9 - 10.3 mg/dL 9.2 9.3 9.1  Total Protein 6.5 - 8.1 g/dL 7.5 7.4 7.8  Total Bilirubin 0.3 - 1.2 mg/dL 0.7 0.5 0.5  Alkaline Phos 38 - 126 U/L 99 109 115  AST 15 - 41 U/L 12(L) 12(L) 11(L)  ALT 0 - 44 U/L 8 8 8     All questions were answered to patient's stated satisfaction. Encouraged patient to call with any new concerns or questions before his next visit to the cancer [facility] and we can certain see him sooner, if needed.     ASSESSMENT & PLAN:  Anemia 1.  Microcytic anemia: -Last colonoscopy was in June 2019 in [place], 3 polyps removed. -Etiology is from combination of CKD and relative iron deficiency. -Initial work-up included SPEP which was negative.  Stool was negative for occult blood.  Hemoglobin electrophoresis showed severe microcytosis related to her anemia.  She could have underlying thalassemia/sickle cell trait. -She has a history of needing a blood transfusion in the early 2000's. -She denies any bright red bleeding per rectum or melena. -If there is further worsening of anemia with hemoglobin approaching 8, we will consider erythropoiesis stimulating agents. -Labs done on 11/21/2019 showed hemoglobin 9.0. -Her nephrologist Dr. Theador Hawthorne started Epogen injections 3000 every 2 weeks. -She will follow-up in 8 weeks with repeat labs.  2.  CKD: -Ultrasound the kidneys on 08/22/2019 showed bilateral renal atrophy, severe atrophy of the right kidney. -Labs done on 11/21/2019 showed creatinine 1.5 -Dr.Bhutani started Epogen injections every 2 weeks 3000 units. -She follows up with nephrology next week.  3.  123456 and folic acid deficiency: -She is taking folic acid 1 mg daily -She is doing B12 injections  monthly.     Orders placed this encounter:  Orders Placed This Encounter  Procedures  . Lactate dehydrogenase  . CBC with Differential/Platelet  .  Comprehensive metabolic panel  . Ferritin  . Iron and TIBC  . Vitamin B12  . VITAMIN D 25 Hydroxy (Vit-D Deficiency, Fractures)      Francene Finders, FNP-C [place] [phone]

## 2019-11-22 ENCOUNTER — Encounter (HOSPITAL_COMMUNITY)
Admission: RE | Admit: 2019-11-22 | Discharge: 2019-11-22 | Disposition: A | Discharge status: Home / Self Care | Payer: Medicare Other | Source: Ambulatory Visit | Attending: Nephrology | Admitting: Nephrology

## 2019-11-22 DIAGNOSIS — D631 Anemia in chronic kidney disease: Secondary | ICD-10-CM | POA: Insufficient documentation

## 2019-11-22 DIAGNOSIS — N1832 Chronic kidney disease, stage 3b: Secondary | ICD-10-CM | POA: Insufficient documentation

## 2019-11-22 LAB — VITAMIN D 25 HYDROXY (VIT D DEFICIENCY, FRACTURES): Vit D, 25-Hydroxy: 34.8 ng/mL (ref 30–100)

## 2019-11-22 MED ORDER — EPOETIN ALFA 3000 UNIT/ML IJ SOLN: 3000.0000 [IU] | Freq: Once | INTRAMUSCULAR | Status: DC

## 2019-11-24 ENCOUNTER — Encounter (HOSPITAL_COMMUNITY)
Admission: RE | Admit: 2019-11-24 | Discharge: 2019-11-24 | Disposition: A | Discharge status: Home / Self Care | Payer: Medicare Other | Source: Ambulatory Visit | Attending: Nephrology | Admitting: Nephrology

## 2019-11-24 ENCOUNTER — Other Ambulatory Visit: Payer: Self-pay

## 2019-11-24 DIAGNOSIS — D631 Anemia in chronic kidney disease: Secondary | ICD-10-CM | POA: Diagnosis not present

## 2019-11-24 DIAGNOSIS — N1832 Chronic kidney disease, stage 3b: Secondary | ICD-10-CM | POA: Diagnosis not present

## 2019-11-24 LAB — POCT HEMOGLOBIN-HEMACUE: Hemoglobin: 8.3 g/dL — ABNORMAL LOW (ref 12.0–15.0)

## 2019-11-24 MED ORDER — EPOETIN ALFA 3000 UNIT/ML IJ SOLN
3000.0000 [IU] | Freq: Once | INTRAMUSCULAR | Status: AC
  Administered 2019-11-24: 3000 [IU] via SUBCUTANEOUS

## 2019-11-24 MED ORDER — EPOETIN ALFA 3000 UNIT/ML IJ SOLN
INTRAMUSCULAR | Status: AC
  Filled 2019-11-24: qty 1

## 2019-11-29 ENCOUNTER — Other Ambulatory Visit: Payer: Self-pay

## 2019-11-29 ENCOUNTER — Encounter (HOSPITAL_COMMUNITY): Payer: Self-pay | Admitting: *Deleted

## 2019-11-29 ENCOUNTER — Emergency Department (HOSPITAL_COMMUNITY)
Admission: EM | Admit: 2019-11-29 | Discharge: 2019-11-29 | Disposition: A | Discharge status: Home / Self Care | Payer: Medicare Other | Attending: Emergency Medicine | Admitting: Emergency Medicine

## 2019-11-29 ENCOUNTER — Emergency Department (HOSPITAL_COMMUNITY): Payer: Medicare Other

## 2019-11-29 DIAGNOSIS — R0602 Shortness of breath: Secondary | ICD-10-CM | POA: Diagnosis not present

## 2019-11-29 DIAGNOSIS — D638 Anemia in other chronic diseases classified elsewhere: Secondary | ICD-10-CM | POA: Diagnosis not present

## 2019-11-29 DIAGNOSIS — R000 Tachycardia, unspecified: Secondary | ICD-10-CM | POA: Diagnosis not present

## 2019-11-29 DIAGNOSIS — R809 Proteinuria, unspecified: Secondary | ICD-10-CM | POA: Diagnosis not present

## 2019-11-29 DIAGNOSIS — E860 Dehydration: Secondary | ICD-10-CM | POA: Insufficient documentation

## 2019-11-29 DIAGNOSIS — R531 Weakness: Secondary | ICD-10-CM | POA: Diagnosis not present

## 2019-11-29 DIAGNOSIS — Z87891 Personal history of nicotine dependence: Secondary | ICD-10-CM | POA: Insufficient documentation

## 2019-11-29 DIAGNOSIS — Z91040 Latex allergy status: Secondary | ICD-10-CM | POA: Insufficient documentation

## 2019-11-29 DIAGNOSIS — E119 Type 2 diabetes mellitus without complications: Secondary | ICD-10-CM | POA: Diagnosis not present

## 2019-11-29 DIAGNOSIS — Z79899 Other long term (current) drug therapy: Secondary | ICD-10-CM | POA: Diagnosis not present

## 2019-11-29 DIAGNOSIS — E871 Hypo-osmolality and hyponatremia: Secondary | ICD-10-CM | POA: Diagnosis not present

## 2019-11-29 DIAGNOSIS — J45909 Unspecified asthma, uncomplicated: Secondary | ICD-10-CM | POA: Insufficient documentation

## 2019-11-29 DIAGNOSIS — N189 Chronic kidney disease, unspecified: Secondary | ICD-10-CM | POA: Diagnosis not present

## 2019-11-29 DIAGNOSIS — I10 Essential (primary) hypertension: Secondary | ICD-10-CM | POA: Diagnosis not present

## 2019-11-29 LAB — COMPREHENSIVE METABOLIC PANEL
ALT: 9 U/L (ref 0–44)
AST: 14 U/L — ABNORMAL LOW (ref 15–41)
Albumin: 4.3 g/dL (ref 3.5–5.0)
Alkaline Phosphatase: 88 U/L (ref 38–126)
Anion gap: 12 (ref 5–15)
BUN: 16 mg/dL (ref 8–23)
CO2: 25 mmol/L (ref 22–32)
Calcium: 9.5 mg/dL (ref 8.9–10.3)
Chloride: 96 mmol/L — ABNORMAL LOW (ref 98–111)
Creatinine, Ser: 1.9 mg/dL — ABNORMAL HIGH (ref 0.44–1.00)
GFR calc Af Amer: 29 mL/min — ABNORMAL LOW (ref >60)
GFR calc non Af Amer: 25 mL/min — ABNORMAL LOW (ref >60)
Glucose, Bld: 121 mg/dL — ABNORMAL HIGH (ref 70–99)
Potassium: 4 mmol/L (ref 3.5–5.1)
Sodium: 133 mmol/L — ABNORMAL LOW (ref 135–145)
Total Bilirubin: 0.9 mg/dL (ref 0.3–1.2)
Total Protein: 7.4 g/dL (ref 6.5–8.1)

## 2019-11-29 LAB — CBC WITH DIFFERENTIAL/PLATELET
Abs Immature Granulocytes: 0.03 10*3/uL (ref 0.00–0.07)
Basophils Absolute: 0 10*3/uL (ref 0.0–0.1)
Basophils Relative: 1 %
Eosinophils Absolute: 0.1 10*3/uL (ref 0.0–0.5)
Eosinophils Relative: 1 %
HCT: 31.4 % — ABNORMAL LOW (ref 36.0–46.0)
Hemoglobin: 9 g/dL — ABNORMAL LOW (ref 12.0–15.0)
Immature Granulocytes: 0 %
Lymphocytes Relative: 25 %
Lymphs Abs: 2 10*3/uL (ref 0.7–4.0)
MCH: 17.4 pg — ABNORMAL LOW (ref 26.0–34.0)
MCHC: 28.7 g/dL — ABNORMAL LOW (ref 30.0–36.0)
MCV: 60.7 fL — ABNORMAL LOW (ref 80.0–100.0)
Monocytes Absolute: 0.4 10*3/uL (ref 0.1–1.0)
Monocytes Relative: 5 %
Neutro Abs: 5.6 10*3/uL (ref 1.7–7.7)
Neutrophils Relative %: 68 %
Platelets: 238 10*3/uL (ref 150–400)
RBC: 5.17 MIL/uL — ABNORMAL HIGH (ref 3.87–5.11)
RDW: 21.9 % — ABNORMAL HIGH (ref 11.5–15.5)
WBC: 8.1 10*3/uL (ref 4.0–10.5)
nRBC: 0 % (ref 0.0–0.2)

## 2019-11-29 LAB — BRAIN NATRIURETIC PEPTIDE: B Natriuretic Peptide: 11 pg/mL (ref 0.0–100.0)

## 2019-11-29 MED ORDER — SODIUM CHLORIDE 0.9 % IV BOLUS
1000.0000 mL | Freq: Once | INTRAVENOUS | Status: AC
  Administered 2019-11-29: 1000 mL via INTRAVENOUS

## 2019-11-29 NOTE — ED Provider Notes (Signed)
[facility] EMERGENCY DEPARTMENT Provider Note   CSN: [id number] Arrival date & time: 11/29/19  1142     History Chief Complaint  Patient presents with  . Shortness of Breath    Traci Parker is a 74 y.o. female.  HPI    Patient presents with concern of weakness, shortness of breath. Patient has multiple medical issues including chronic anemia, and is being evaluated for renal dysfunction. Actually discussed the patient with her nephrologist prior to her arrival here.  She notes that over the past few days she has had decreased energy, and though she has had no pain she has had weakness with attempts at activity, exertion, ambulation, all of which were previously more accomplishable. No syncope, no fever, no cough. No recent medication change, diet change. She has been receiving her scheduled anemia therapy, without recent changes.   Past Medical History:  Diagnosis Date  . Allergy   . Anemia   . Asthma   . Blood transfusion without reported diagnosis   . Diabetes mellitus without complication ([place])   . Glaucoma   . History of blood in urine   . Hypertension   . Osteoporosis     Patient Active Problem List   Diagnosis Date Noted  . Nausea and vomiting 10/30/2019  . Fatigue 10/30/2019  . Osteoporosis 09/20/2019  . Hypertension 09/20/2019  . Anemia 05/08/2019  . Screening for breast cancer 03/23/2019  . Hyperlipidemia 03/23/2019  . Pre-diabetes 03/23/2019  . Open-angle glaucoma 03/23/2019    Past Surgical History:  Procedure Laterality Date  . CHOLECYSTECTOMY  2008  . COLONOSCOPY  04/30/2011   Procedure: COLONOSCOPY;  Surgeon: Rogene Houston, MD;  [facility];  Service: Endoscopy;  Laterality: N/A;  . COLONOSCOPY  05/25/2012   Procedure: COLONOSCOPY;  Surgeon: Rogene Houston, MD;  [facility];  Service: Endoscopy;  Laterality: N/A;  1:25-changed to 1200 Ann to notify pt  . COLONOSCOPY Bilateral 12/2017  . GALLBLADDER SURGERY    .  right breast cystectomy  1988  . TUBAL LIGATION  1975     OB History   No obstetric history on file.     Family History  Problem Relation Age of Onset  . Diabetes Mother   . Hyperlipidemia Mother   . Hypertension Mother   . Heart disease Father   . Hyperlipidemia Father   . Diabetes Sister   . Hyperlipidemia Sister   . Hypertension Sister   . Cancer Brother   . Heart disease Brother   . Hyperlipidemia Brother   . Hypertension Brother   . Diabetes Sister     Social History   Tobacco Use  . Smoking status: Former Smoker    Packs/day: 1.00    Years: 40.00    Pack years: 40.00    Quit date: 04/03/2011    Years since quitting: 8.6  . Smokeless tobacco: Never Used  Substance Use Topics  . Alcohol use: No  . Drug use: No    Home Medications Prior to Admission medications   Medication Sig Start Date End Date Taking? Authorizing Provider  albuterol (VENTOLIN HFA) 108 (90 Base) MCG/ACT inhaler Inhale 2 puffs into the lungs every 4 (four) hours as needed for wheezing or shortness of breath. 11/21/19   Lockamy, Randi L, NP-C  amLODipine (NORVASC) 5 MG tablet Take 2 tablets (10 mg total) by mouth daily. 09/20/19   Corum, Rex Kras, MD  atorvastatin (LIPITOR) 10 MG tablet Take 1 tablet (10 mg total)  by mouth daily. 09/20/19   Corum, Rex Kras, MD  calcium-vitamin D (OSCAL WITH D) 500-200 MG-UNIT tablet Take 1 tablet by mouth daily.    [provider]  cyanocobalamin (,VITAMIN B-12,) 1000 MCG/ML injection Inject 1 mL (1,000 mcg total) into the muscle every 30 (thirty) days. 05/30/19   Corum, Rex Kras, MD  dorzolamide (TRUSOPT) 2 % ophthalmic solution Place 1 drop into both eyes 2 (two) times daily.    [provider]  epoetin alfa (EPOGEN) 3000 UNIT/ML injection Inject 3,000 Units into the vein every 14 (fourteen) days.    Bhutani, Manpreet S, MD  folic acid (FOLVITE) 1 MG tablet Take 1 tablet (1 mg total) by mouth daily. 05/31/19   Derek Jack, MD  latanoprost  (XALATAN) 0.005 % ophthalmic solution Place 1 drop into both eyes at bedtime.    [provider]  Multiple Vitamin (MULTIVITAMIN) tablet Take 1 tablet by mouth daily.     [provider]  omeprazole (PRILOSEC) 40 MG capsule Take 1 capsule (40 mg total) by mouth daily. 10/30/19   Corum, Rex Kras, MD  ondansetron (ZOFRAN ODT) 4 MG disintegrating tablet Take 1 tablet (4 mg total) by mouth every 8 (eight) hours as needed for nausea or vomiting. Dissolve under tongue 11/21/19   Lockamy, Randi L, NP-C    Allergies    Latex, Neomycin-bacitracin-polymyxin  [bacitracin-neomycin-polymyxin], Other, Fish allergy, Neosporin [neomycin-polymyxin-gramicidin], and Prednisone  Review of Systems   Review of Systems  Constitutional:       Per HPI, otherwise negative  HENT:       Per HPI, otherwise negative  Respiratory:       Per HPI, otherwise negative  Cardiovascular:       Per HPI, otherwise negative  Gastrointestinal: Negative for vomiting.  Endocrine:       Negative aside from HPI  Genitourinary:       Neg aside from HPI   Musculoskeletal:       Per HPI, otherwise negative  Skin: Negative.   Neurological: Positive for weakness. Negative for syncope.    Physical Exam Updated Vital Signs BP 102/66 (BP Location: Right Arm)   Pulse (!) 124   Temp (!) 97.5 F (36.4 C) (Oral)   Ht 5\' 4"  (1.626 m)   Wt 52.6 kg   SpO2 100%   BMI 19.91 kg/m   Physical Exam Vitals and nursing note reviewed.  Constitutional:      General: She is not in acute distress.    Appearance: She is well-developed.  HENT:     Head: Normocephalic and atraumatic.  Eyes:     Conjunctiva/sclera: Conjunctivae normal.  Cardiovascular:     Rate and Rhythm: Regular rhythm. Tachycardia present.  Pulmonary:     Effort: Pulmonary effort is normal. Tachypnea present. No respiratory distress.     Breath sounds: Normal breath sounds. No stridor. No wheezing.  Abdominal:     General: There is no distension.    Skin:    General: Skin is warm and dry.  Neurological:     Mental Status: She is alert and oriented to person, place, and time.     Cranial Nerves: No cranial nerve deficit.     ED Results / Procedures / Treatments   Labs (all labs ordered are listed, but only abnormal results are displayed) Labs Reviewed  COMPREHENSIVE METABOLIC PANEL  CBC WITH DIFFERENTIAL/PLATELET  BRAIN NATRIURETIC PEPTIDE  URINALYSIS, ROUTINE W REFLEX MICROSCOPIC    EKG None  Radiology No results found.  Procedures Procedures (including critical care time)  Medications Ordered in ED Medications - No data to display  ED Course  I have reviewed the triage vital signs and the nursing notes.  Pertinent labs & imaging results that were available during my care of the patient were reviewed by me and considered in my medical decision making (see chart for details).     Chart review after initial evaluation notable for nurse practitioner visit with hematology oncology over the past 2 months.  Assessment and plan as below: 1.  Microcytic anemia: -Last colonoscopy was in June 2019 in [place], 3 polyps removed. -Etiology is from combination of CKD and relative iron deficiency. -Initial work-up included SPEP which was negative.  Stool was negative for occult blood.  Hemoglobin electrophoresis showed severe microcytosis related to her anemia.  She could have underlying thalassemia/sickle cell trait. -She has a history of needing a blood transfusion in the early 2000's. -She denies any bright red bleeding per rectum or melena. -If there is further worsening of anemia with hemoglobin approaching 8, we will consider erythropoiesis stimulating agents. -Labs done on 10/24/2019 showed hemoglobin 8.8. -Her nephrologist Dr. Theador Hawthorne started Epogen injections 3000 every 2 weeks. -She will follow-up in 4 weeks with repeat labs.   2.  CKD: -Ultrasound the kidneys on 08/22/2019 showed bilateral renal atrophy, severe  atrophy of the right kidney. -Creatinine stable around 1.35. -Dr.Bhutani started Epogen injections every 2 weeks 3000 units. -She follows up with nephrology.   3.  123456 and folic acid deficiency: -She is taking folic acid 1 mg daily -She is doing B12 injections monthly.   3:45 PM Heart rate below 100, patient in no distress, no hypoxia, no increased work of breathing. She is now accompanied by her daughter. We discussed findings thus far, generally reassuring aside from elevated creatinine consistent with dehydration.  Given the patient's substantial improvement after fluid resuscitation, resolution of tachycardia, there are some suspicion for this being causative to her episode of weakness, dyspnea, though her history of smoking, possible mild COPD are likely contributing as well. Patient's labs otherwise somewhat reassuring, with unremarkable hemoglobin for her, x-ray does not demonstrate pneumonia, and given her substantial improvement here, she is amenable to, appropriate for outpatient follow-up.  Final Clinical Impression(s) / ED Diagnoses Final diagnoses:  SOB (shortness of breath)  Dehydration     Carmin Muskrat, MD 11/29/19 [phone]

## 2019-11-29 NOTE — Discharge Instructions (Addendum)
As discussed, today's evaluation has been generally reassuring. There is evidence for dehydration and possibly some effects from your smoking history.  However, there is no evidence for pneumonia, and your blood counts are generally stable.  Please be sure to stay well-hydrated, monitor your condition carefully, and do not hesitate to return here.  Otherwise, please be sure to schedule follow-up with your physician.

## 2019-11-29 NOTE — ED Triage Notes (Addendum)
Pt c/o SOB and shallow breathing x several days. Pt denies known hx of COPD but does report she has hx of smoking, but hasn't smoked for 40 years.Pt reports she has never had any SOB and this is very abnormal for her. Pt is able to complete sentences in triage. Pt reports the SOB is worse with exertion and not so much with rest.

## 2019-11-29 NOTE — ED Notes (Signed)
ED Provider at bedside. 

## 2019-11-30 DIAGNOSIS — R9431 Abnormal electrocardiogram [ECG] [EKG]: Secondary | ICD-10-CM | POA: Diagnosis not present

## 2019-11-30 DIAGNOSIS — R0602 Shortness of breath: Secondary | ICD-10-CM | POA: Diagnosis not present

## 2019-11-30 DIAGNOSIS — R5383 Other fatigue: Secondary | ICD-10-CM | POA: Diagnosis not present

## 2019-11-30 DIAGNOSIS — N200 Calculus of kidney: Secondary | ICD-10-CM | POA: Diagnosis not present

## 2019-11-30 DIAGNOSIS — Z20822 Contact with and (suspected) exposure to covid-19: Secondary | ICD-10-CM | POA: Diagnosis not present

## 2019-11-30 DIAGNOSIS — R197 Diarrhea, unspecified: Secondary | ICD-10-CM | POA: Diagnosis not present

## 2019-11-30 DIAGNOSIS — R112 Nausea with vomiting, unspecified: Secondary | ICD-10-CM | POA: Diagnosis not present

## 2019-11-30 DIAGNOSIS — I714 Abdominal aortic aneurysm, without rupture: Secondary | ICD-10-CM | POA: Diagnosis not present

## 2019-11-30 DIAGNOSIS — K8689 Other specified diseases of pancreas: Secondary | ICD-10-CM | POA: Diagnosis not present

## 2019-11-30 DIAGNOSIS — R000 Tachycardia, unspecified: Secondary | ICD-10-CM | POA: Diagnosis not present

## 2019-11-30 DIAGNOSIS — R1110 Vomiting, unspecified: Secondary | ICD-10-CM | POA: Diagnosis not present

## 2019-12-01 DIAGNOSIS — R1110 Vomiting, unspecified: Secondary | ICD-10-CM | POA: Diagnosis not present

## 2019-12-01 DIAGNOSIS — N200 Calculus of kidney: Secondary | ICD-10-CM | POA: Diagnosis not present

## 2019-12-01 DIAGNOSIS — R9431 Abnormal electrocardiogram [ECG] [EKG]: Secondary | ICD-10-CM | POA: Diagnosis not present

## 2019-12-01 DIAGNOSIS — K8689 Other specified diseases of pancreas: Secondary | ICD-10-CM | POA: Diagnosis not present

## 2019-12-01 DIAGNOSIS — I714 Abdominal aortic aneurysm, without rupture: Secondary | ICD-10-CM | POA: Diagnosis not present

## 2019-12-01 DIAGNOSIS — R0602 Shortness of breath: Secondary | ICD-10-CM | POA: Diagnosis not present

## 2019-12-05 ENCOUNTER — Other Ambulatory Visit: Payer: Self-pay

## 2019-12-05 ENCOUNTER — Ambulatory Visit (INDEPENDENT_AMBULATORY_CARE_PROVIDER_SITE_OTHER): Payer: Medicare Other | Admitting: Gastroenterology

## 2019-12-05 ENCOUNTER — Telehealth: Payer: Self-pay | Admitting: Family Medicine

## 2019-12-05 ENCOUNTER — Other Ambulatory Visit: Payer: Self-pay | Admitting: Family Medicine

## 2019-12-05 ENCOUNTER — Encounter (INDEPENDENT_AMBULATORY_CARE_PROVIDER_SITE_OTHER): Payer: Self-pay | Admitting: Gastroenterology

## 2019-12-05 VITALS — BP 84/57 | HR 114 | Temp 96.9°F | Ht 64.0 in | Wt 114.9 lb

## 2019-12-05 DIAGNOSIS — R112 Nausea with vomiting, unspecified: Secondary | ICD-10-CM

## 2019-12-05 DIAGNOSIS — D649 Anemia, unspecified: Secondary | ICD-10-CM

## 2019-12-05 DIAGNOSIS — R531 Weakness: Secondary | ICD-10-CM

## 2019-12-05 DIAGNOSIS — N289 Disorder of kidney and ureter, unspecified: Secondary | ICD-10-CM

## 2019-12-05 DIAGNOSIS — R5383 Other fatigue: Secondary | ICD-10-CM

## 2019-12-05 DIAGNOSIS — E538 Deficiency of other specified B group vitamins: Secondary | ICD-10-CM

## 2019-12-05 DIAGNOSIS — R634 Abnormal weight loss: Secondary | ICD-10-CM | POA: Diagnosis not present

## 2019-12-05 MED ORDER — METOCLOPRAMIDE HCL 5 MG PO TABS
5.0000 mg | ORAL_TABLET | Freq: Three times a day (TID) | ORAL | 0 refills | Status: DC
Start: 2019-12-05 — End: 2019-12-18

## 2019-12-05 NOTE — Progress Notes (Signed)
Patient profile: Traci Parker is a 74 y.o. female seen for evaluation of nausea/vomiting wt loss.   History of Present Illness: Traci Parker is seen today for evaluation of weight loss, she is accompanied by her son who helps with history  She reports beginning to have significant weight loss in late February 2020.  Weight loss as outlined as below. She reports she becomes full after a few bites of any solid food. A lot of times if she eats anything that is solid she will begin vomiting immediately.  Yesterday had 3 bites of potatoes and had vomiting.  She does tolerate liquids better than solids.  Nausea seems intermittent and Zofran seems to help if she takes it before trying to eat.  She does not have any abdominal pain with the nausea vomiting.  She denies significant GERD symptoms or dysphagia. Does not feel she took any new meds at onset of symptoms except started epogen injections mid march. Doesn't think she takes PPI on her med list.   Her bowels are every few days, feels she is not eating enough to have a daily bowel movement.  She has not noted any blood in her stools or black stools.  She reports 6 months ago she could walk long distances and now gets winded after a few steps, son endorses significant decline in functional status.  She was seen in the emergency room on May 19 and May 21  Wt Readings from Last 3 Encounters:  12/05/19 114 lb 14.4 oz (52.1 kg)  11/29/19 116 lb (52.6 kg)  11/21/19 120 lb 3.2 oz (54.5 kg)  04/2019-#147    Last Colonoscopy: per notes June 2019, she had 3 polyp adenomas removed Last Endoscopy: per patient many years ago.    Past Medical History:  Past Medical History:  Diagnosis Date  . Allergy   . Anemia   . Asthma   . Blood transfusion without reported diagnosis   . Diabetes mellitus without complication ([place])   . Glaucoma   . History of blood in urine   . Hypertension   . Osteoporosis     Problem List: Patient Active Problem List    Diagnosis Date Noted  . Nausea and vomiting 10/30/2019  . Fatigue 10/30/2019  . Osteoporosis 09/20/2019  . Hypertension 09/20/2019  . Anemia 05/08/2019  . Screening for breast cancer 03/23/2019  . Hyperlipidemia 03/23/2019  . Pre-diabetes 03/23/2019  . Open-angle glaucoma 03/23/2019    Past Surgical History: Past Surgical History:  Procedure Laterality Date  . CHOLECYSTECTOMY  2008  . COLONOSCOPY  04/30/2011   Procedure: COLONOSCOPY;  Surgeon: Rogene Houston, MD;  [facility];  Service: Endoscopy;  Laterality: N/A;  . COLONOSCOPY  05/25/2012   Procedure: COLONOSCOPY;  Surgeon: Rogene Houston, MD;  [facility];  Service: Endoscopy;  Laterality: N/A;  1:25-changed to 1200 Ann to notify pt  . COLONOSCOPY Bilateral 12/2017  . GALLBLADDER SURGERY    . right breast cystectomy  1988  . TUBAL LIGATION  1975    Allergies: Allergies  Allergen Reactions  . Latex Rash  . Neomycin-Bacitracin-Polymyxin  [Bacitracin-Neomycin-Polymyxin] Rash  . Other Rash  . Fish Allergy     rash  . Neosporin [Neomycin-Polymyxin-Gramicidin] Itching and Rash  . Prednisone Hives, Itching and Rash      Home Medications:  Current Outpatient Medications:  .  albuterol (VENTOLIN HFA) 108 (90 Base) MCG/ACT inhaler, Inhale 2 puffs into the lungs every 4 (four) hours as  needed for wheezing or shortness of breath., Disp: 8 g, Rfl: 2 .  amLODipine (NORVASC) 5 MG tablet, Take 2 tablets (10 mg total) by mouth daily., Disp: 180 tablet, Rfl: 1 .  atorvastatin (LIPITOR) 10 MG tablet, Take 1 tablet (10 mg total) by mouth daily., Disp: 90 tablet, Rfl: 1 .  calcium-vitamin D (OSCAL WITH D) 500-200 MG-UNIT tablet, Take 1 tablet by mouth daily., Disp: , Rfl:  .  cyanocobalamin (,VITAMIN B-12,) 1000 MCG/ML injection, Inject 1 mL (1,000 mcg total) into the muscle every 30 (thirty) days., Disp: 30 mL, Rfl: 0 .  dorzolamide (TRUSOPT) 2 % ophthalmic solution, Place 1 drop into both eyes 2 (two) times  daily., Disp: , Rfl:  .  epoetin alfa (EPOGEN) 3000 UNIT/ML injection, Inject 3,000 Units into the vein every 14 (fourteen) days., Disp: , Rfl:  .  folic acid (FOLVITE) 1 MG tablet, Take 1 tablet (1 mg total) by mouth daily., Disp: 30 tablet, Rfl: 6 .  latanoprost (XALATAN) 0.005 % ophthalmic solution, Place 1 drop into both eyes at bedtime., Disp: , Rfl:  .  Multiple Vitamin (MULTIVITAMIN) tablet, Take 1 tablet by mouth daily. , Disp: , Rfl:  .  ondansetron (ZOFRAN ODT) 4 MG disintegrating tablet, Take 1 tablet (4 mg total) by mouth every 8 (eight) hours as needed for nausea or vomiting. Dissolve under tongue, Disp: 60 tablet, Rfl: 2 .  metoCLOPramide (REGLAN) 5 MG tablet, Take 1 tablet (5 mg total) by mouth 3 (three) times daily before meals., Disp: 90 tablet, Rfl: 0 .  omeprazole (PRILOSEC) 40 MG capsule, Take 1 capsule (40 mg total) by mouth daily. (Patient not taking: Reported on 11/29/2019), Disp: 30 capsule, Rfl: 3   Family History: family history includes Cancer in her brother; Diabetes in her mother, sister, and sister; Heart disease in her brother and father; Hyperlipidemia in her brother, father, mother, and sister; Hypertension in her brother, mother, and sister.    Social History:   reports that she quit smoking about 8 years ago. She has a 40.00 pack-year smoking history. She has never used smokeless tobacco. She reports that she does not drink alcohol or use drugs.   Review of Systems: Constitutional: Denies weight loss/weight gain  Eyes: No changes in vision. ENT: No oral lesions, sore throat.  GI: see HPI.  Heme/Lymph: No easy bruising.  CV: No chest pain.  GU: No hematuria.  Integumentary: No rashes.  Neuro: No headaches.  Psych: No depression/anxiety.  Endocrine: No heat/cold intolerance.  Allergic/Immunologic: No urticaria.  Resp: No cough, SOB.  Musculoskeletal: No joint swelling.    Physical Examination: BP (!) 84/57 (BP Location: Right Arm, Patient Position:  Sitting, Cuff Size: Normal)   Pulse (!) 114   Temp (!) 96.9 F (36.1 C) (Temporal)   Ht 5\' 4"  (1.626 m)   Wt 114 lb 14.4 oz (52.1 kg)   BMI 19.72 kg/m  Gen: NAD, alert and oriented x 4 HEENT: PEERLA, EOMI, Neck: supple, no JVD Chest: CTA bilaterally, no wheezes, crackles, or other adventitious sounds CV:  tachycardia, no m/g/c/r Abd: soft, NT, ND, +BS in all four quadrants; no HSM, guarding, ridigity, or rebound tenderness RECTAL - normal, hemoccult negative.  Ext: no edema, well perfused with 2+ pulses, Skin: no rash or lesions noted on observed skin Lymph: no noted LAD  Data:  12/01/2018-CT ABDOMEN PELVIS WO CONTRAST (ROUTINE) Final Result  1. Mild soft tissue stranding about the pancreas uncinate process. Correlate with lipase levels.  2. Bilateral nonobstructing  nephrolithiasis. No hydronephrosis.  3. Infrarenal abdominal aortic aneurysm measuring 2.8 cm in diameter. Correlate with prior imaging to assess change in size and consider 3 year imaging follow-up.   11/30/19--hemoglobin 8.4, MCV 55, free T4 normal, lipase normal, BNP normal, sodium 131, magnesium 1.3, potassium 3.2, creatinine 1.7   11/29/19--CMP w/ Cr 1.9, AST 14, GFR 25, Na 133, Cl 96, CBC with hemoglobin 9.0, MCV 60, BNP normal  11/21/19-Hgb 9.0 MCV 61 10/24/19-Hgb 8.8, MCV 63  04/2019-CBC w/ Hgb 10.1, MCV 68, hemoccult negative x 3, ferritin 215, iron 53, TIBC 223, low folate at 2.7  Assessment/Plan: Ms. Bick is a 74 y.o. female   1.  Weight loss/weakness/nausea/vomiting-symptoms over the past 3 months (unintentional 30lbs this year), negative ER evaluation x2 including CT with pancreatic abnormalities but normal lipase.  Significant chronic anemia but Hemoccult negative today.  We will plan for endoscopy this week to evaluate symptoms and plan for AM fasting cortisol morning of endoscopy.  In interim can try Reglan which may help symptoms.  Case discussed in detail with Dr. Laural Golden.  Patient denies CP, SOB,  and use of blood thinners. I discussed the risks and benefits of procedure including bleeding, perforation, infection, missed lesions, medication reactions and possible hospitalization or surgery if complications. All questions answered.     Sharetta was seen today for new patient (initial visit).  Diagnoses and all orders for this visit:  Nausea and vomiting, intractability of vomiting not specified, unspecified vomiting type  Loss of weight  Weakness  Other orders -     metoCLOPramide (REGLAN) 5 MG tablet; Take 1 tablet (5 mg total) by mouth 3 (three) times daily before meals.        I personally performed the service, non-incident to. (WP)  Laurine Blazer, [facility]

## 2019-12-05 NOTE — Patient Instructions (Signed)
We are starting reglan - I sent to pharmacy

## 2019-12-05 NOTE — Telephone Encounter (Signed)
Need appt with GI asap-referral updated for worsening nausea, vomiting, fatigue, loss of appetite symptoms. July appt does not address acuity

## 2019-12-05 NOTE — Telephone Encounter (Signed)
Patient called in and is wanting a referral to a gastro doctor. She has an appointment with Dr. Laural Golden office in July but states she wants to be seen sooner somewhere else in [place]. Patient is also requesting a call back from the nurse to discuss with her why. [phone]

## 2019-12-05 NOTE — Telephone Encounter (Signed)
Patient is scheduled with GI this evening at 2:45 per [facility]. Patient has been informed.

## 2019-12-05 NOTE — Telephone Encounter (Signed)
Called [facility] and [facility], both are booked until July as well. Spoke with Rosaria Ferries, she is going to reach out to Dr. Laural Golden office.

## 2019-12-05 NOTE — Telephone Encounter (Signed)
Patient and son wants her to be seen sooner than her appt July 22 locally with GI, she was seen at ED in [facility] over weekend, states she is still continuing to loose weight, requesting [facility] or [facility] whichever is sooner.

## 2019-12-06 ENCOUNTER — Other Ambulatory Visit (INDEPENDENT_AMBULATORY_CARE_PROVIDER_SITE_OTHER): Payer: Self-pay | Admitting: *Deleted

## 2019-12-06 ENCOUNTER — Other Ambulatory Visit (HOSPITAL_COMMUNITY)
Admission: RE | Admit: 2019-12-06 | Discharge: 2019-12-06 | Disposition: A | Discharge status: Home / Self Care | Payer: Medicare Other | Source: Ambulatory Visit | Attending: Internal Medicine | Admitting: Internal Medicine

## 2019-12-06 ENCOUNTER — Other Ambulatory Visit (INDEPENDENT_AMBULATORY_CARE_PROVIDER_SITE_OTHER): Payer: Self-pay | Admitting: Gastroenterology

## 2019-12-06 DIAGNOSIS — Z01812 Encounter for preprocedural laboratory examination: Secondary | ICD-10-CM | POA: Diagnosis not present

## 2019-12-06 DIAGNOSIS — Z20822 Contact with and (suspected) exposure to covid-19: Secondary | ICD-10-CM | POA: Diagnosis not present

## 2019-12-06 LAB — SARS CORONAVIRUS 2 (TAT 6-24 HRS): SARS Coronavirus 2: NEGATIVE

## 2019-12-07 ENCOUNTER — Other Ambulatory Visit: Payer: Self-pay

## 2019-12-07 ENCOUNTER — Encounter (HOSPITAL_COMMUNITY): Payer: Self-pay | Admitting: Internal Medicine

## 2019-12-07 ENCOUNTER — Ambulatory Visit (HOSPITAL_COMMUNITY)
Admission: RE | Admit: 2019-12-07 | Discharge: 2019-12-07 | Disposition: A | Discharge status: Home / Self Care | Payer: Medicare Other | Attending: Internal Medicine | Admitting: Internal Medicine

## 2019-12-07 ENCOUNTER — Encounter (HOSPITAL_COMMUNITY)
Admission: RE | Disposition: A | Discharge status: Home / Self Care | Payer: Self-pay | Source: Home / Self Care | Attending: Internal Medicine

## 2019-12-07 DIAGNOSIS — Z79899 Other long term (current) drug therapy: Secondary | ICD-10-CM | POA: Insufficient documentation

## 2019-12-07 DIAGNOSIS — Z8249 Family history of ischemic heart disease and other diseases of the circulatory system: Secondary | ICD-10-CM | POA: Insufficient documentation

## 2019-12-07 DIAGNOSIS — R112 Nausea with vomiting, unspecified: Secondary | ICD-10-CM | POA: Diagnosis not present

## 2019-12-07 DIAGNOSIS — H409 Unspecified glaucoma: Secondary | ICD-10-CM | POA: Diagnosis not present

## 2019-12-07 DIAGNOSIS — E538 Deficiency of other specified B group vitamins: Secondary | ICD-10-CM | POA: Insufficient documentation

## 2019-12-07 DIAGNOSIS — Z87891 Personal history of nicotine dependence: Secondary | ICD-10-CM | POA: Insufficient documentation

## 2019-12-07 DIAGNOSIS — R634 Abnormal weight loss: Secondary | ICD-10-CM

## 2019-12-07 DIAGNOSIS — Z91040 Latex allergy status: Secondary | ICD-10-CM | POA: Diagnosis not present

## 2019-12-07 DIAGNOSIS — E1122 Type 2 diabetes mellitus with diabetic chronic kidney disease: Secondary | ICD-10-CM | POA: Diagnosis not present

## 2019-12-07 DIAGNOSIS — J45909 Unspecified asthma, uncomplicated: Secondary | ICD-10-CM | POA: Diagnosis not present

## 2019-12-07 DIAGNOSIS — N189 Chronic kidney disease, unspecified: Secondary | ICD-10-CM | POA: Insufficient documentation

## 2019-12-07 DIAGNOSIS — Z833 Family history of diabetes mellitus: Secondary | ICD-10-CM | POA: Diagnosis not present

## 2019-12-07 DIAGNOSIS — Z91013 Allergy to seafood: Secondary | ICD-10-CM | POA: Diagnosis not present

## 2019-12-07 DIAGNOSIS — K2950 Unspecified chronic gastritis without bleeding: Secondary | ICD-10-CM | POA: Insufficient documentation

## 2019-12-07 DIAGNOSIS — K3189 Other diseases of stomach and duodenum: Secondary | ICD-10-CM | POA: Diagnosis not present

## 2019-12-07 DIAGNOSIS — I129 Hypertensive chronic kidney disease with stage 1 through stage 4 chronic kidney disease, or unspecified chronic kidney disease: Secondary | ICD-10-CM | POA: Diagnosis not present

## 2019-12-07 DIAGNOSIS — Z888 Allergy status to other drugs, medicaments and biological substances status: Secondary | ICD-10-CM | POA: Insufficient documentation

## 2019-12-07 DIAGNOSIS — D631 Anemia in chronic kidney disease: Secondary | ICD-10-CM | POA: Insufficient documentation

## 2019-12-07 DIAGNOSIS — K2970 Gastritis, unspecified, without bleeding: Secondary | ICD-10-CM | POA: Diagnosis not present

## 2019-12-07 HISTORY — PX: ESOPHAGOGASTRODUODENOSCOPY: SHX5428

## 2019-12-07 LAB — CORTISOL: Cortisol, Plasma: 13.7 ug/dL

## 2019-12-07 LAB — GLUCOSE, CAPILLARY: Glucose-Capillary: 116 mg/dL — ABNORMAL HIGH (ref 70–99)

## 2019-12-07 SURGERY — EGD (ESOPHAGOGASTRODUODENOSCOPY)
Anesthesia: Moderate Sedation

## 2019-12-07 MED ORDER — SODIUM CHLORIDE 0.9 % IV SOLN: INTRAVENOUS | Status: DC

## 2019-12-07 MED ORDER — LIDOCAINE VISCOUS HCL 2 % MT SOLN
OROMUCOSAL | Status: DC | PRN
  Administered 2019-12-07: 4 mL via OROMUCOSAL

## 2019-12-07 MED ORDER — MIDAZOLAM HCL 5 MG/5ML IJ SOLN
INTRAMUSCULAR | Status: DC | PRN
  Administered 2019-12-07: 2 mg via INTRAVENOUS
  Administered 2019-12-07: 1 mg via INTRAVENOUS
  Administered 2019-12-07: 2 mg via INTRAVENOUS

## 2019-12-07 MED ORDER — STERILE WATER FOR IRRIGATION IR SOLN
Status: DC | PRN
  Administered 2019-12-07: 100 mL

## 2019-12-07 MED ORDER — MEPERIDINE HCL 50 MG/ML IJ SOLN
INTRAMUSCULAR | Status: DC | PRN
  Administered 2019-12-07 (×2): 25 mg via INTRAVENOUS

## 2019-12-07 NOTE — Op Note (Signed)
[facility] Patient Name: Traci Parker Procedure Date: 12/07/2019 1:49 PM MRN: [id number] Date of Birth: 23-Mar-1946 Attending MD: Hildred Laser , MD CSN: [id number] Age: 74 Admit Type: Outpatient Procedure:                Upper GI endoscopy Indications:              Nausea with vomiting, Weight loss Providers:                Hildred Laser, MD, Rosina Lowenstein, RN, Aram Candela Referring MD:             Benny Lennert, MD Medicines:                Lidocaine spray, Meperidine 50 mg IV, Midazolam 5                            mg IV Complications:            No immediate complications. Estimated Blood Loss:     Estimated blood loss was minimal. Procedure:                Pre-Anesthesia Assessment:                           - Prior to the procedure, a History and Physical                            was performed, and patient medications and                            allergies were reviewed. The patient's tolerance of                            previous anesthesia was also reviewed. The risks                            and benefits of the procedure and the sedation                            options and risks were discussed with the patient.                            All questions were answered, and informed consent                            was obtained. Prior Anticoagulants: The patient has                            taken no previous anticoagulant or antiplatelet                            agents. ASA Grade Assessment: II - A patient with                            mild systemic disease. After reviewing the risks  and benefits, the patient was deemed in                            satisfactory condition to undergo the procedure.                           After obtaining informed consent, the endoscope was                            passed under direct vision. Throughout the                            procedure, the patient's blood pressure, pulse, and                             oxygen saturations were monitored continuously. The                            [id number]) was introduced through the                            mouth, and advanced to the second part of duodenum.                            The upper GI endoscopy was accomplished without                            difficulty. The patient tolerated the procedure                            well. Scope In: 2:39:43 PM Scope Out: 2:50:37 PM Total Procedure Duration: 0 hours 10 minutes 54 seconds  Findings:      The hypopharynx was normal.      The examined esophagus was normal.      The Z-line was regular and was found 38 cm from the incisors.      Diffuse mild inflammation characterized by congestion (edema) and       friability was found in the entire examined stomach. Biopsies were taken       with a cold forceps for histology. The pathology specimen was placed       into Bottle Number 1. The pathology specimen was placed into Bottle       Number 2.      The pylorus was normal.      The duodenal bulb and second portion of the duodenum were normal. Impression:               - Normal hypopharynx.                           - Normal esophagus.                           - Z-line regular, 38 cm from the incisors.                           - Gastritis. Biopsied.                           -  Normal pylorus.                           - Normal duodenal bulb and second portion of the                            duodenum. Moderate Sedation:      Moderate (conscious) sedation was administered by the endoscopy nurse       and supervised by the endoscopist. The following parameters were       monitored: oxygen saturation, heart rate, blood pressure, CO2       capnography and response to care. Total physician intraservice time was       16 minutes. Recommendation:           - Patient has a contact number available for                            emergencies. The signs and symptoms of potential                             delayed complications were discussed with the                            patient. Return to normal activities tomorrow.                            Written discharge instructions were provided to the                            patient.                           - Mechanical soft diet today.                           - Continue present medications.                           - No aspirin, ibuprofen, naproxen, or other                            non-steroidal anti-inflammatory drugs.                           - Check cortisol level today.                           - Await pathology results. Procedure Code(s):        --- Professional ---                           [phone], Esophagogastroduodenoscopy, flexible,                            transoral; with biopsy, single or multiple  G0500, Moderate sedation services provided by the                            same physician or other qualified health care                            professional performing a gastrointestinal                            endoscopic service that sedation supports,                            requiring the presence of an independent trained                            observer to assist in the monitoring of the                            patient's level of consciousness and physiological                            status; initial 15 minutes of intra-service time;                            patient age 37 years or older (additional time may                            be reported with [phone], as appropriate) Diagnosis Code(s):        --- Professional ---                           K29.70, Gastritis, unspecified, without bleeding                           R11.2, Nausea with vomiting, unspecified                           R63.4, Abnormal weight loss CPT copyright 2019 American Medical Association. All rights reserved. The codes documented in this report are preliminary and upon coder  review may  be revised to meet current compliance requirements. Hildred Laser, MD Hildred Laser, MD 12/07/2019 3:03:06 PM This report has been signed electronically. Number of Addenda: 0

## 2019-12-07 NOTE — H&P (Signed)
Traci Parker is an 74 y.o. female.   Chief Complaint: Patient is here for esophagogastroduodenoscopy. HPI: Patient is 74 year old African-American female who was diabetes mellitus chronic kidney disease as well as microcytic anemia felt to be not due to iron deficiency as well as B12 deficiency who has not been feeling well for 3 months.  She has daily nausea and postprandial vomiting.  She also has noted drop in her appetite.  She states she has lost 30 pounds in last 3 months.  She she has had 2 abdominal pelvic CTs.  1 of these was done at [facility] and did not show any abnormality to account for her symptoms.  Patient also complains of exertional dyspnea.  She was seen in emergency room few days ago.  BNP was normal.  Chest film did not show any acute abnormality.  It raised the possibility of mild emphysema. Patient denies abdominal pain hematemesis melena or rectal bleeding.  She also denies fever chills or night sweats. She does not take OTC NSAIDs. Patient was seen in our office 2 days ago.  We gave her prescription for metoclopramide 5 mg to be taken 30 minutes before each meal.  She has not tried that medication yet.  Past Medical History:  Diagnosis Date  . Allergy   . Anemia   . Asthma   . Blood transfusion without reported diagnosis more than 5 years ago.   . Diabetes mellitus without complication ([place])   . Glaucoma   . History of blood in urine   . Hypertension   . Osteoporosis         CKD  Past Surgical History:  Procedure Laterality Date  . CHOLECYSTECTOMY  2008  . COLONOSCOPY  04/30/2011   Procedure: COLONOSCOPY;  Surgeon: Rogene Houston, MD;  [facility];  Service: Endoscopy;  Laterality: N/A;  . COLONOSCOPY  05/25/2012   Procedure: COLONOSCOPY;  Surgeon: Rogene Houston, MD;  [facility];  Service: Endoscopy;  Laterality: N/A;  1:25-changed to 1200 Ann to notify pt  . COLONOSCOPY Bilateral 12/2017  . GALLBLADDER SURGERY    . right breast  cystectomy  1988  . TUBAL LIGATION  1975    Family History  Problem Relation Age of Onset  . Diabetes Mother   . Hyperlipidemia Mother   . Hypertension Mother   . Heart disease Father   . Hyperlipidemia Father   . Diabetes Sister   . Hyperlipidemia Sister   . Hypertension Sister   . Cancer Brother   . Heart disease Brother   . Hyperlipidemia Brother   . Hypertension Brother   . Diabetes Sister    Social History:  reports that she quit smoking about 8 years ago. She has a 40.00 pack-year smoking history. She has never used smokeless tobacco. She reports that she does not drink alcohol or use drugs.  Allergies:  Allergies  Allergen Reactions  . Latex Rash  . Neomycin-Bacitracin-Polymyxin  [Bacitracin-Neomycin-Polymyxin] Rash  . Other Rash  . Fish Allergy     rash  . Neosporin [Neomycin-Polymyxin-Gramicidin] Itching and Rash  . Prednisone Hives, Itching and Rash    Medications Prior to Admission  Medication Sig Dispense Refill  . albuterol (VENTOLIN HFA) 108 (90 Base) MCG/ACT inhaler Inhale 2 puffs into the lungs every 4 (four) hours as needed for wheezing or shortness of breath. 8 g 2  . amLODipine (NORVASC) 5 MG tablet Take 2 tablets (10 mg total) by mouth daily. 180 tablet 1  .  atorvastatin (LIPITOR) 10 MG tablet Take 1 tablet (10 mg total) by mouth daily. 90 tablet 1  . calcium-vitamin D (OSCAL WITH D) 500-200 MG-UNIT tablet Take 1 tablet by mouth daily.    . dorzolamide (TRUSOPT) 2 % ophthalmic solution Place 1 drop into both eyes 2 (two) times daily.    Marland Kitchen epoetin alfa (EPOGEN) 3000 UNIT/ML injection Inject 3,000 Units into the vein every 14 (fourteen) days.    . folic acid (FOLVITE) 1 MG tablet Take 1 tablet (1 mg total) by mouth daily. 30 tablet 6  . latanoprost (XALATAN) 0.005 % ophthalmic solution Place 1 drop into both eyes at bedtime.    . metoCLOPramide (REGLAN) 5 MG tablet Take 1 tablet (5 mg total) by mouth 3 (three) times daily before meals. 90 tablet 0  .  Multiple Vitamin (MULTIVITAMIN) tablet Take 1 tablet by mouth daily.     Marland Kitchen omeprazole (PRILOSEC) 40 MG capsule Take 1 capsule (40 mg total) by mouth daily. 30 capsule 3  . ondansetron (ZOFRAN ODT) 4 MG disintegrating tablet Take 1 tablet (4 mg total) by mouth every 8 (eight) hours as needed for nausea or vomiting. Dissolve under tongue 60 tablet 2  . cyanocobalamin (,VITAMIN B-12,) 1000 MCG/ML injection Inject 1 mL (1,000 mcg total) into the muscle every 30 (thirty) days. 30 mL 0    Results for orders placed or performed during the hospital encounter of 12/06/19 (from the past 48 hour(s))  SARS CORONAVIRUS 2 (TAT 6-24 HRS) Nasopharyngeal Nasopharyngeal Swab     Status: None   Collection Time: 12/06/19  8:29 AM   Specimen: Nasopharyngeal Swab  Result Value Ref Range   SARS Coronavirus 2 NEGATIVE NEGATIVE    Comment: (NOTE) SARS-CoV-2 target nucleic acids are NOT DETECTED. The SARS-CoV-2 RNA is generally detectable in upper and lower respiratory specimens during the acute phase of infection. Negative results do not preclude SARS-CoV-2 infection, do not rule out co-infections with other pathogens, and should not be used as the sole basis for treatment or other patient management decisions. Negative results must be combined with clinical observations, patient history, and epidemiological information. The expected result is Negative. Fact Sheet for Patients: SugarRoll.be Fact Sheet for Healthcare Providers: https://www.woods-mathews.com/ This test is not yet approved or cleared by the [place] FDA and  has been authorized for detection and/or diagnosis of SARS-CoV-2 by FDA under an Emergency Use Authorization (EUA). This EUA will remain  in effect (meaning this test can be used) for the duration of the COVID-19 declaration under Section 56 4(b)(1) of the Act, 21 U.S.C. section 360bbb-3(b)(1), unless the authorization is terminated or revoked  sooner. Performed at [facility], [place] [address], [place], [place] [address]    No results found.  Review of Systems  Blood pressure (!) 142/74, pulse (!) 102, temperature 98.9 F (37.2 C), temperature source Oral, resp. rate 17, height 5\' 4"  (1.626 m), weight 52.2 kg, SpO2 99 %. Physical Exam  Constitutional:  Well-developed thin female in NAD.  HENT:  Mouth/Throat: Oropharynx is clear and moist.  Eyes: Conjunctivae are normal. No scleral icterus.  Neck: No thyromegaly present.  Cardiovascular: Normal rate, regular rhythm and normal heart sounds.  No murmur heard. Respiratory: Effort normal and breath sounds normal.  GI: Soft. She exhibits no distension and no mass. There is no abdominal tenderness.  Musculoskeletal:        General: No edema.  Lymphadenopathy:    She has no cervical adenopathy.  Neurological: She is alert.  Skin: Skin is warm and dry.     Assessment/Plan Nausea vomiting and weight loss. Diagnostic esophagogastroduodenoscopy.  Hildred Laser, MD 12/07/2019, 2:26 PM

## 2019-12-07 NOTE — Discharge Instructions (Signed)
No aspirin or NSAIDs. Resume usual medications as before Take ondansetron 4 mg 30 minutes before breakfast and evening meal daily for 1 week and thereafter on as-needed basis. Begin metoclopramide 5 mg by mouth 30 minutes before each meal.  If this medication makes you nervous or experienced any other side effects stop the medication and call office. Mechanical soft diet. No driving for 24 hours. Physician will call with results of blood test and biopsy.  Upper Endoscopy, Adult, Care After This sheet gives you information about how to care for yourself after your procedure. Your health care provider may also give you more specific instructions. If you have problems or questions, contact your health care provider. What can I expect after the procedure? After the procedure, it is common to have:  A sore throat.  Mild stomach pain or discomfort.  Bloating.  Nausea. Follow these instructions at home:   Follow instructions from your health care provider about what to eat or drink after your procedure.  Return to your normal activities as told by your health care provider. Ask your health care provider what activities are safe for you.  Take over-the-counter and prescription medicines only as told by your health care provider.  Do not drive for 24 hours if you were given a sedative during your procedure.  Keep all follow-up visits as told by your health care provider. This is important. Contact a health care provider if you have:  A sore throat that lasts longer than one day.  Trouble swallowing. Get help right away if:  You vomit blood or your vomit looks like coffee grounds.  You have: ? A fever. ? Bloody, black, or tarry stools. ? A severe sore throat or you cannot swallow. ? Difficulty breathing. ? Severe pain in your chest or abdomen. Summary  After the procedure, it is common to have a sore throat, mild stomach discomfort, bloating, and nausea.  Do not drive for 24  hours if you were given a sedative during the procedure.  Follow instructions from your health care provider about what to eat or drink after your procedure.  Return to your normal activities as told by your health care provider. This information is not intended to replace advice given to you by your health care provider. Make sure you discuss any questions you have with your health care provider. Document Revised: 12/21/2017 Document Reviewed: 11/29/2017 Elsevier Patient Education  [facility] A soft-food eating plan includes foods that are safe and easy to chew and swallow. Your health care provider or dietitian can help you find foods and flavors that fit into this plan. Follow this plan until your health care provider or dietitian says it is safe to start eating other foods and food textures. What are tips for following this plan? General guidelines   Take small bites of food, or cut food into pieces about  inch or smaller. Bite-sized pieces of food are easier to chew and swallow.  Eat moist foods. Avoid overly dry foods.  Avoid foods that: ? Are difficult to swallow, such as dry, chunky, crispy, or sticky foods. ? Are difficult to chew, such as hard, tough, or stringy foods. ? Contain nuts, seeds, or fruits.  Follow instructions from your dietitian about the types of liquids that are safe for you to swallow. You may be allowed to have: ? Thick liquids only. This includes only liquids that are thicker than honey. ? Thin and thick liquids. This includes all beverages  and foods that become liquid at room temperature.  To make thick liquids: ? Purchase a commercial liquid thickening powder. These are available at grocery stores and pharmacies. ? Mix the thickener into liquids according to instructions on the label. ? Purchase ready-made thickened liquids. ? Thicken soup by pureeing, straining to remove chunks, and adding flour, potato flakes, or corn  starch. ? Add commercial thickener to foods that become liquid at room temperature, such as milk shakes, yogurt, ice cream, gelatin, and sherbet.  Ask your health care provider whether you need to take a fiber supplement. Cooking  Cook meats so they stay tender and moist. Use methods like braising, stewing, or baking in liquid.  Cook vegetables and fruit until they are soft enough to be mashed with a fork.  Peel soft, fresh fruits such as peaches, nectarines, and melons.  When making soup, make sure chunks of meat and vegetables are smaller than  inch.  Reheat leftover foods slowly so that a tough crust does not form. What foods are allowed? The items listed below may not be a complete list. Talk with your dietitian about what dietary choices are best for you. Grains Breads, muffins, pancakes, or waffles moistened with syrup, jelly, or butter. Dry cereals well-moistened with milk. Moist, cooked cereals. Well-cooked pasta and rice. Vegetables All soft-cooked vegetables. Shredded lettuce. Fruits All canned and cooked fruits. Soft, peeled fresh fruits. Strawberries. Dairy Milk. Cream. Yogurt. Cottage cheese. Soft cheese without the rind. Meats and other protein foods Tender, moist ground meat, poultry, or fish. Meat cooked in gravy or sauces. Eggs. Sweets and desserts Ice cream. Milk shakes. Sherbet. Pudding. Fats and oils Butter. Margarine. Olive, canola, sunflower, and grapeseed oil. Smooth salad dressing. Smooth cream cheese. Mayonnaise. Gravy. What foods are not allowed? The items listed bemay not be a complete list. Talk with your dietitian about what dietary choices are best for you. Grains Coarse or dry cereals, such as bran, granola, and shredded wheat. Tough or chewy crusty breads, such as [place] bread or baguettes. Breads with nuts, seeds, or fruit. Vegetables All raw vegetables. Cooked corn. Cooked vegetables that are tough or stringy. Tough, crisp, fried potatoes and  potato skins. Fruits Fresh fruits with skins or seeds, or both, such as apples, pears, and grapes. Stringy, high-pulp fruits, such as papaya, pineapple, coconut, and mango. Fruit leather and all dried fruit. Dairy Yogurt with nuts or coconut. Meats and other protein foods Hard, dry sausages. Dry meat, poultry, or fish. Meats with gristle. Fish with bones. Fried meat or fish. Lunch meat and hotdogs. Nuts and seeds. Chunky peanut butter or other nut butters. Sweets and desserts Cakes or cookies that are very dry or chewy. Desserts with dried fruit, nuts, or coconut. Fried pastries. Very rich pastries. Fats and oils Cream cheese with fruit or nuts. Salad dressings with seeds or chunks. Summary  A soft-food eating plan includes foods that are safe and easy to swallow. Generally, the foods should be soft enough to be mashed with a fork.  Avoid foods that are dry, hard to chew, crunchy, sticky, stringy, or crispy.  Ask your health care provider whether you need to thicken your liquids and if you need to take a fiber supplement. This information is not intended to replace advice given to you by your health care provider. Make sure you discuss any questions you have with your health care provider. Document Revised: 10/20/2018 Document Reviewed: 09/01/2016 Elsevier Patient Education  [place].

## 2019-12-08 ENCOUNTER — Ambulatory Visit (HOSPITAL_COMMUNITY): Payer: Medicare Other

## 2019-12-08 ENCOUNTER — Encounter (HOSPITAL_COMMUNITY)
Admission: RE | Admit: 2019-12-08 | Discharge: 2019-12-08 | Disposition: A | Discharge status: Home / Self Care | Payer: Medicare Other | Source: Ambulatory Visit | Attending: Nephrology | Admitting: Nephrology

## 2019-12-08 ENCOUNTER — Encounter (HOSPITAL_COMMUNITY): Payer: Medicare Other

## 2019-12-12 ENCOUNTER — Other Ambulatory Visit: Payer: Self-pay

## 2019-12-12 ENCOUNTER — Encounter (HOSPITAL_COMMUNITY): Payer: Self-pay

## 2019-12-13 ENCOUNTER — Inpatient Hospital Stay (HOSPITAL_COMMUNITY): Payer: Medicare Other | Attending: Hematology

## 2019-12-13 ENCOUNTER — Other Ambulatory Visit (INDEPENDENT_AMBULATORY_CARE_PROVIDER_SITE_OTHER): Payer: Self-pay | Admitting: *Deleted

## 2019-12-13 ENCOUNTER — Inpatient Hospital Stay (HOSPITAL_COMMUNITY): Payer: Medicare Other

## 2019-12-13 ENCOUNTER — Encounter (HOSPITAL_COMMUNITY): Payer: Self-pay

## 2019-12-13 ENCOUNTER — Other Ambulatory Visit (HOSPITAL_COMMUNITY): Payer: Self-pay | Admitting: Nurse Practitioner

## 2019-12-13 VITALS — BP 113/49 | HR 94 | Temp 97.3°F | Resp 19

## 2019-12-13 DIAGNOSIS — E860 Dehydration: Secondary | ICD-10-CM | POA: Insufficient documentation

## 2019-12-13 DIAGNOSIS — N189 Chronic kidney disease, unspecified: Secondary | ICD-10-CM | POA: Insufficient documentation

## 2019-12-13 DIAGNOSIS — E538 Deficiency of other specified B group vitamins: Secondary | ICD-10-CM | POA: Diagnosis not present

## 2019-12-13 DIAGNOSIS — D631 Anemia in chronic kidney disease: Secondary | ICD-10-CM | POA: Diagnosis not present

## 2019-12-13 DIAGNOSIS — R634 Abnormal weight loss: Secondary | ICD-10-CM

## 2019-12-13 DIAGNOSIS — R112 Nausea with vomiting, unspecified: Secondary | ICD-10-CM

## 2019-12-13 LAB — CBC WITH DIFFERENTIAL/PLATELET
Abs Immature Granulocytes: 0.03 10*3/uL (ref 0.00–0.07)
Basophils Absolute: 0 10*3/uL (ref 0.0–0.1)
Basophils Relative: 0 %
Eosinophils Absolute: 0 10*3/uL (ref 0.0–0.5)
Eosinophils Relative: 0 %
HCT: 28 % — ABNORMAL LOW (ref 36.0–46.0)
Hemoglobin: 8.2 g/dL — ABNORMAL LOW (ref 12.0–15.0)
Immature Granulocytes: 0 %
Lymphocytes Relative: 20 %
Lymphs Abs: 1.8 10*3/uL (ref 0.7–4.0)
MCH: 17.3 pg — ABNORMAL LOW (ref 26.0–34.0)
MCHC: 29.3 g/dL — ABNORMAL LOW (ref 30.0–36.0)
MCV: 58.9 fL — ABNORMAL LOW (ref 80.0–100.0)
Monocytes Absolute: 0.4 10*3/uL (ref 0.1–1.0)
Monocytes Relative: 4 %
Neutro Abs: 6.7 10*3/uL (ref 1.7–7.7)
Neutrophils Relative %: 76 %
Platelets: 300 10*3/uL (ref 150–400)
RBC: 4.75 MIL/uL (ref 3.87–5.11)
RDW: 22.4 % — ABNORMAL HIGH (ref 11.5–15.5)
WBC: 8.9 10*3/uL (ref 4.0–10.5)
nRBC: 0 % (ref 0.0–0.2)

## 2019-12-13 LAB — COMPREHENSIVE METABOLIC PANEL
ALT: 12 U/L (ref 0–44)
AST: 16 U/L (ref 15–41)
Albumin: 4.3 g/dL (ref 3.5–5.0)
Alkaline Phosphatase: 70 U/L (ref 38–126)
Anion gap: 17 — ABNORMAL HIGH (ref 5–15)
BUN: 22 mg/dL (ref 8–23)
CO2: 21 mmol/L — ABNORMAL LOW (ref 22–32)
Calcium: 9.4 mg/dL (ref 8.9–10.3)
Chloride: 96 mmol/L — ABNORMAL LOW (ref 98–111)
Creatinine, Ser: 2.21 mg/dL — ABNORMAL HIGH (ref 0.44–1.00)
GFR calc Af Amer: 24 mL/min — ABNORMAL LOW (ref >60)
GFR calc non Af Amer: 21 mL/min — ABNORMAL LOW (ref >60)
Glucose, Bld: 140 mg/dL — ABNORMAL HIGH (ref 70–99)
Potassium: 3.8 mmol/L (ref 3.5–5.1)
Sodium: 134 mmol/L — ABNORMAL LOW (ref 135–145)
Total Bilirubin: 1 mg/dL (ref 0.3–1.2)
Total Protein: 7.7 g/dL (ref 6.5–8.1)

## 2019-12-13 LAB — LACTATE DEHYDROGENASE: LDH: 108 U/L (ref 98–192)

## 2019-12-13 LAB — FERRITIN: Ferritin: 151 ng/mL (ref 11–307)

## 2019-12-13 LAB — SURGICAL PATHOLOGY

## 2019-12-13 LAB — IRON AND TIBC
Iron: 65 ug/dL (ref 28–170)
Saturation Ratios: 38 % — ABNORMAL HIGH (ref 10.4–31.8)
TIBC: 172 ug/dL — ABNORMAL LOW (ref 250–450)
UIBC: 107 ug/dL

## 2019-12-13 LAB — VITAMIN D 25 HYDROXY (VIT D DEFICIENCY, FRACTURES): Vit D, 25-Hydroxy: 41.12 ng/mL (ref 30–100)

## 2019-12-13 LAB — VITAMIN B12: Vitamin B-12: 3636 pg/mL — ABNORMAL HIGH (ref 180–914)

## 2019-12-13 MED ORDER — EPOETIN ALFA-EPBX 10000 UNIT/ML IJ SOLN
10000.0000 [IU] | Freq: Once | INTRAMUSCULAR | Status: AC
  Administered 2019-12-13: 10000 [IU] via SUBCUTANEOUS

## 2019-12-13 MED ORDER — SODIUM CHLORIDE 0.9 % IV SOLN
Freq: Once | INTRAVENOUS | Status: AC
  Filled 2019-12-13: qty 1000

## 2019-12-13 MED ORDER — EPOETIN ALFA-EPBX 10000 UNIT/ML IJ SOLN
INTRAMUSCULAR | Status: AC
  Filled 2019-12-13: qty 1

## 2019-12-13 NOTE — Patient Instructions (Signed)
[facility] at [facility] Discharge Instructions  Received IV hydration with magnesium and potassium as well as Retacrit injection. Follow-up as scheduled   Thank you for choosing [facility] at [facility] to provide your oncology and hematology care.  To afford each patient quality time with our provider, please arrive at least 15 minutes before your scheduled appointment time.   If you have a lab appointment with the [facility] please come in thru the Main Entrance and check in at the main information desk.  You need to re-schedule your appointment should you arrive 10 or more minutes late.  We strive to give you quality time with our providers, and arriving late affects you and other patients whose appointments are after yours.  Also, if you no show three or more times for appointments you may be dismissed from the clinic at the providers discretion.     Again, thank you for choosing [facility].  Our hope is that these requests will decrease the amount of time that you wait before being seen by our physicians.       _____________________________________________________________  Should you have questions after your visit to [facility], please contact our office at [phone] between the hours of 8:00 a.m. and 4:30 p.m.  Voicemails left after 4:00 p.m. will not be returned until the following business day.  For prescription refill requests, have your pharmacy contact our office and allow 72 hours.    Due to Covid, you will need to wear a mask upon entering the hospital. If you do not have a mask, a mask will be given to you at the Main Entrance upon arrival. For doctor visits, patients may have 1 support person with them. For treatment visits, patients can not have anyone with them due to social distancing guidelines and our immunocompromised population.

## 2019-12-13 NOTE — Progress Notes (Signed)
Traci Parker tolerated IV hydration with magnesium and potassium as well as Retacrit injection well without complaints or incident. Hgb 8.2 today. VSS upon discharge Peripheral IV site checked with positive blood return noted prior to and after infusion. Pt discharged via wheelchair in satisfactory condition accompanied by family member

## 2019-12-13 NOTE — Progress Notes (Signed)
Patient presents today for IV hydration. Patient called and spoke with [facility] RN and reported complaints of weakness, nausea, fatigue. Blood pressure 81/46.

## 2019-12-14 ENCOUNTER — Other Ambulatory Visit: Payer: Self-pay

## 2019-12-14 ENCOUNTER — Ambulatory Visit (HOSPITAL_COMMUNITY)
Admission: RE | Admit: 2019-12-14 | Discharge: 2019-12-14 | Disposition: A | Discharge status: Home / Self Care | Payer: Medicare Other | Source: Ambulatory Visit | Attending: Internal Medicine | Admitting: Internal Medicine

## 2019-12-14 DIAGNOSIS — N200 Calculus of kidney: Secondary | ICD-10-CM | POA: Diagnosis not present

## 2019-12-14 DIAGNOSIS — R634 Abnormal weight loss: Secondary | ICD-10-CM | POA: Insufficient documentation

## 2019-12-14 DIAGNOSIS — R112 Nausea with vomiting, unspecified: Secondary | ICD-10-CM | POA: Insufficient documentation

## 2019-12-17 ENCOUNTER — Encounter (INDEPENDENT_AMBULATORY_CARE_PROVIDER_SITE_OTHER): Payer: Self-pay | Admitting: Nurse Practitioner

## 2019-12-18 ENCOUNTER — Ambulatory Visit (HOSPITAL_COMMUNITY): Payer: Medicare Other

## 2019-12-18 ENCOUNTER — Other Ambulatory Visit: Payer: Self-pay

## 2019-12-18 ENCOUNTER — Emergency Department (HOSPITAL_COMMUNITY): Payer: Medicare Other

## 2019-12-18 ENCOUNTER — Other Ambulatory Visit (HOSPITAL_COMMUNITY): Payer: Medicare Other

## 2019-12-18 ENCOUNTER — Inpatient Hospital Stay (HOSPITAL_COMMUNITY)
Admission: EM | Admit: 2019-12-18 | Discharge: 2019-12-21 | DRG: 074 | Disposition: A | Discharge status: Home / Self Care | Payer: Medicare Other | Source: Ambulatory Visit | Attending: Internal Medicine | Admitting: Internal Medicine

## 2019-12-18 ENCOUNTER — Encounter (HOSPITAL_COMMUNITY): Payer: Self-pay | Admitting: Nephrology

## 2019-12-18 ENCOUNTER — Other Ambulatory Visit (HOSPITAL_COMMUNITY): Payer: Self-pay | Admitting: *Deleted

## 2019-12-18 ENCOUNTER — Encounter (HOSPITAL_COMMUNITY): Payer: Self-pay | Admitting: *Deleted

## 2019-12-18 ENCOUNTER — Ambulatory Visit (INDEPENDENT_AMBULATORY_CARE_PROVIDER_SITE_OTHER): Payer: Medicare Other | Admitting: Nurse Practitioner

## 2019-12-18 ENCOUNTER — Encounter (INDEPENDENT_AMBULATORY_CARE_PROVIDER_SITE_OTHER): Payer: Self-pay | Admitting: Nurse Practitioner

## 2019-12-18 ENCOUNTER — Telehealth: Payer: Self-pay | Admitting: Gastroenterology

## 2019-12-18 VITALS — BP 88/58 | HR 114 | Temp 97.5°F | Ht 64.0 in | Wt 114.0 lb

## 2019-12-18 DIAGNOSIS — R112 Nausea with vomiting, unspecified: Secondary | ICD-10-CM | POA: Diagnosis not present

## 2019-12-18 DIAGNOSIS — Z801 Family history of malignant neoplasm of trachea, bronchus and lung: Secondary | ICD-10-CM | POA: Diagnosis not present

## 2019-12-18 DIAGNOSIS — K2970 Gastritis, unspecified, without bleeding: Secondary | ICD-10-CM | POA: Diagnosis not present

## 2019-12-18 DIAGNOSIS — E869 Volume depletion, unspecified: Secondary | ICD-10-CM | POA: Diagnosis not present

## 2019-12-18 DIAGNOSIS — R531 Weakness: Secondary | ICD-10-CM | POA: Diagnosis not present

## 2019-12-18 DIAGNOSIS — I959 Hypotension, unspecified: Secondary | ICD-10-CM | POA: Diagnosis present

## 2019-12-18 DIAGNOSIS — Z91040 Latex allergy status: Secondary | ICD-10-CM

## 2019-12-18 DIAGNOSIS — Z800 Family history of malignant neoplasm of digestive organs: Secondary | ICD-10-CM

## 2019-12-18 DIAGNOSIS — J45909 Unspecified asthma, uncomplicated: Secondary | ICD-10-CM | POA: Diagnosis not present

## 2019-12-18 DIAGNOSIS — N1832 Chronic kidney disease, stage 3b: Secondary | ICD-10-CM | POA: Diagnosis not present

## 2019-12-18 DIAGNOSIS — H409 Unspecified glaucoma: Secondary | ICD-10-CM | POA: Diagnosis not present

## 2019-12-18 DIAGNOSIS — F459 Somatoform disorder, unspecified: Secondary | ICD-10-CM | POA: Diagnosis present

## 2019-12-18 DIAGNOSIS — I129 Hypertensive chronic kidney disease with stage 1 through stage 4 chronic kidney disease, or unspecified chronic kidney disease: Secondary | ICD-10-CM | POA: Diagnosis present

## 2019-12-18 DIAGNOSIS — I10 Essential (primary) hypertension: Secondary | ICD-10-CM | POA: Diagnosis not present

## 2019-12-18 DIAGNOSIS — R000 Tachycardia, unspecified: Secondary | ICD-10-CM | POA: Diagnosis not present

## 2019-12-18 DIAGNOSIS — R634 Abnormal weight loss: Secondary | ICD-10-CM | POA: Diagnosis not present

## 2019-12-18 DIAGNOSIS — Z8261 Family history of arthritis: Secondary | ICD-10-CM

## 2019-12-18 DIAGNOSIS — R5381 Other malaise: Secondary | ICD-10-CM | POA: Diagnosis not present

## 2019-12-18 DIAGNOSIS — Z8249 Family history of ischemic heart disease and other diseases of the circulatory system: Secondary | ICD-10-CM

## 2019-12-18 DIAGNOSIS — Z87442 Personal history of urinary calculi: Secondary | ICD-10-CM

## 2019-12-18 DIAGNOSIS — R1110 Vomiting, unspecified: Secondary | ICD-10-CM | POA: Diagnosis not present

## 2019-12-18 DIAGNOSIS — N179 Acute kidney failure, unspecified: Secondary | ICD-10-CM | POA: Diagnosis not present

## 2019-12-18 DIAGNOSIS — Z888 Allergy status to other drugs, medicaments and biological substances status: Secondary | ICD-10-CM

## 2019-12-18 DIAGNOSIS — K3184 Gastroparesis: Secondary | ICD-10-CM | POA: Diagnosis not present

## 2019-12-18 DIAGNOSIS — E1143 Type 2 diabetes mellitus with diabetic autonomic (poly)neuropathy: Secondary | ICD-10-CM | POA: Diagnosis not present

## 2019-12-18 DIAGNOSIS — R6881 Early satiety: Secondary | ICD-10-CM | POA: Diagnosis not present

## 2019-12-18 DIAGNOSIS — D631 Anemia in chronic kidney disease: Secondary | ICD-10-CM | POA: Diagnosis not present

## 2019-12-18 DIAGNOSIS — R002 Palpitations: Secondary | ICD-10-CM | POA: Diagnosis not present

## 2019-12-18 DIAGNOSIS — Z9049 Acquired absence of other specified parts of digestive tract: Secondary | ICD-10-CM | POA: Diagnosis not present

## 2019-12-18 DIAGNOSIS — R69 Illness, unspecified: Secondary | ICD-10-CM | POA: Diagnosis not present

## 2019-12-18 DIAGNOSIS — Z803 Family history of malignant neoplasm of breast: Secondary | ICD-10-CM | POA: Diagnosis not present

## 2019-12-18 DIAGNOSIS — Z681 Body mass index (BMI) 19 or less, adult: Secondary | ICD-10-CM

## 2019-12-18 DIAGNOSIS — E1122 Type 2 diabetes mellitus with diabetic chronic kidney disease: Secondary | ICD-10-CM | POA: Diagnosis not present

## 2019-12-18 DIAGNOSIS — E860 Dehydration: Secondary | ICD-10-CM | POA: Diagnosis not present

## 2019-12-18 DIAGNOSIS — Z83438 Family history of other disorder of lipoprotein metabolism and other lipidemia: Secondary | ICD-10-CM

## 2019-12-18 DIAGNOSIS — K2950 Unspecified chronic gastritis without bleeding: Secondary | ICD-10-CM | POA: Diagnosis not present

## 2019-12-18 DIAGNOSIS — Z20822 Contact with and (suspected) exposure to covid-19: Secondary | ICD-10-CM | POA: Diagnosis not present

## 2019-12-18 DIAGNOSIS — Z833 Family history of diabetes mellitus: Secondary | ICD-10-CM

## 2019-12-18 DIAGNOSIS — R933 Abnormal findings on diagnostic imaging of other parts of digestive tract: Secondary | ICD-10-CM

## 2019-12-18 DIAGNOSIS — Z881 Allergy status to other antibiotic agents status: Secondary | ICD-10-CM

## 2019-12-18 DIAGNOSIS — D509 Iron deficiency anemia, unspecified: Secondary | ICD-10-CM

## 2019-12-18 DIAGNOSIS — F17210 Nicotine dependence, cigarettes, uncomplicated: Secondary | ICD-10-CM | POA: Diagnosis present

## 2019-12-18 DIAGNOSIS — Z743 Need for continuous supervision: Secondary | ICD-10-CM | POA: Diagnosis not present

## 2019-12-18 LAB — CBC WITH DIFFERENTIAL/PLATELET
Abs Immature Granulocytes: 0.06 10*3/uL (ref 0.00–0.07)
Basophils Absolute: 0 10*3/uL (ref 0.0–0.1)
Basophils Relative: 0 %
Eosinophils Absolute: 0 10*3/uL (ref 0.0–0.5)
Eosinophils Relative: 0 %
HCT: 28.3 % — ABNORMAL LOW (ref 36.0–46.0)
Hemoglobin: 8.3 g/dL — ABNORMAL LOW (ref 12.0–15.0)
Immature Granulocytes: 1 %
Lymphocytes Relative: 10 %
Lymphs Abs: 0.9 10*3/uL (ref 0.7–4.0)
MCH: 17.3 pg — ABNORMAL LOW (ref 26.0–34.0)
MCHC: 29.3 g/dL — ABNORMAL LOW (ref 30.0–36.0)
MCV: 59 fL — ABNORMAL LOW (ref 80.0–100.0)
Monocytes Absolute: 0.4 10*3/uL (ref 0.1–1.0)
Monocytes Relative: 4 %
Neutro Abs: 7.5 10*3/uL (ref 1.7–7.7)
Neutrophils Relative %: 85 %
Platelets: 277 10*3/uL (ref 150–400)
RBC: 4.8 MIL/uL (ref 3.87–5.11)
RDW: 23.1 % — ABNORMAL HIGH (ref 11.5–15.5)
WBC: 8.8 10*3/uL (ref 4.0–10.5)
nRBC: 0.6 % — ABNORMAL HIGH (ref 0.0–0.2)

## 2019-12-18 LAB — URINALYSIS, ROUTINE W REFLEX MICROSCOPIC
Bilirubin Urine: NEGATIVE
Glucose, UA: NEGATIVE mg/dL
Ketones, ur: 5 mg/dL — AB
Nitrite: NEGATIVE
Protein, ur: NEGATIVE mg/dL
Specific Gravity, Urine: 1.012 (ref 1.005–1.030)
pH: 5 (ref 5.0–8.0)

## 2019-12-18 LAB — COMPREHENSIVE METABOLIC PANEL
ALT: 12 U/L (ref 0–44)
AST: 16 U/L (ref 15–41)
Albumin: 4.3 g/dL (ref 3.5–5.0)
Alkaline Phosphatase: 68 U/L (ref 38–126)
Anion gap: 16 — ABNORMAL HIGH (ref 5–15)
BUN: 25 mg/dL — ABNORMAL HIGH (ref 8–23)
CO2: 22 mmol/L (ref 22–32)
Calcium: 10 mg/dL (ref 8.9–10.3)
Chloride: 100 mmol/L (ref 98–111)
Creatinine, Ser: 1.95 mg/dL — ABNORMAL HIGH (ref 0.44–1.00)
GFR calc Af Amer: 28 mL/min — ABNORMAL LOW (ref >60)
GFR calc non Af Amer: 25 mL/min — ABNORMAL LOW (ref >60)
Glucose, Bld: 120 mg/dL — ABNORMAL HIGH (ref 70–99)
Potassium: 3.8 mmol/L (ref 3.5–5.1)
Sodium: 138 mmol/L (ref 135–145)
Total Bilirubin: 1 mg/dL (ref 0.3–1.2)
Total Protein: 7.8 g/dL (ref 6.5–8.1)

## 2019-12-18 LAB — SARS CORONAVIRUS 2 BY RT PCR (HOSPITAL ORDER, PERFORMED IN ~~LOC~~ HOSPITAL LAB): SARS Coronavirus 2: NEGATIVE

## 2019-12-18 LAB — LIPASE, BLOOD: Lipase: 25 U/L (ref 11–51)

## 2019-12-18 MED ORDER — FOLIC ACID 1 MG PO TABS
1.0000 mg | ORAL_TABLET | Freq: Every day | ORAL | Status: DC
  Administered 2019-12-19 – 2019-12-21 (×3): 1 mg via ORAL
  Filled 2019-12-18 (×3): qty 1

## 2019-12-18 MED ORDER — ALBUTEROL SULFATE (2.5 MG/3ML) 0.083% IN NEBU: 3.0000 mL | INHALATION_SOLUTION | RESPIRATORY_TRACT | Status: DC | PRN

## 2019-12-18 MED ORDER — ONDANSETRON HCL 4 MG/2ML IJ SOLN
4.0000 mg | Freq: Four times a day (QID) | INTRAMUSCULAR | Status: DC | PRN
  Filled 2019-12-18: qty 2

## 2019-12-18 MED ORDER — ACETAMINOPHEN 650 MG RE SUPP: 650.0000 mg | Freq: Four times a day (QID) | RECTAL | Status: DC | PRN

## 2019-12-18 MED ORDER — SODIUM CHLORIDE 0.9 % IV BOLUS: 1000.0000 mL | Freq: Once | INTRAVENOUS | Status: DC

## 2019-12-18 MED ORDER — PANTOPRAZOLE SODIUM 40 MG PO TBEC
40.0000 mg | DELAYED_RELEASE_TABLET | Freq: Every day | ORAL | Status: DC
  Administered 2019-12-19 – 2019-12-21 (×3): 40 mg via ORAL
  Filled 2019-12-18 (×3): qty 1

## 2019-12-18 MED ORDER — SODIUM CHLORIDE 0.9 % IV BOLUS: 2000.0000 mL | Freq: Once | INTRAVENOUS | Status: DC

## 2019-12-18 MED ORDER — SODIUM CHLORIDE 0.9 % IV BOLUS
500.0000 mL | Freq: Once | INTRAVENOUS | Status: AC
  Administered 2019-12-18: 500 mL via INTRAVENOUS

## 2019-12-18 MED ORDER — ENOXAPARIN SODIUM 30 MG/0.3ML ~~LOC~~ SOLN
30.0000 mg | SUBCUTANEOUS | Status: DC
  Administered 2019-12-18 – 2019-12-19 (×2): 30 mg via SUBCUTANEOUS
  Filled 2019-12-18 (×2): qty 0.3

## 2019-12-18 MED ORDER — ONDANSETRON HCL 4 MG PO TABS: 4.0000 mg | ORAL_TABLET | Freq: Four times a day (QID) | ORAL | Status: DC | PRN

## 2019-12-18 MED ORDER — LATANOPROST 0.005 % OP SOLN
1.0000 [drp] | Freq: Every day | OPHTHALMIC | Status: DC
  Administered 2019-12-19 – 2019-12-20 (×3): 1 [drp] via OPHTHALMIC
  Filled 2019-12-18: qty 2.5

## 2019-12-18 MED ORDER — DORZOLAMIDE HCL 2 % OP SOLN
1.0000 [drp] | Freq: Two times a day (BID) | OPHTHALMIC | Status: DC
  Administered 2019-12-19 – 2019-12-21 (×5): 1 [drp] via OPHTHALMIC
  Filled 2019-12-18: qty 10

## 2019-12-18 MED ORDER — POLYETHYLENE GLYCOL 3350 17 G PO PACK: 17.0000 g | PACK | Freq: Every day | ORAL | Status: DC | PRN

## 2019-12-18 MED ORDER — SODIUM CHLORIDE 0.9 % IV SOLN: INTRAVENOUS | Status: DC

## 2019-12-18 MED ORDER — ONDANSETRON HCL 4 MG/2ML IJ SOLN
4.0000 mg | Freq: Four times a day (QID) | INTRAMUSCULAR | Status: DC | PRN
  Administered 2019-12-20: 4 mg via INTRAVENOUS

## 2019-12-18 MED ORDER — SODIUM CHLORIDE 0.9 % IV SOLN: Freq: Once | INTRAVENOUS | Status: AC

## 2019-12-18 MED ORDER — ACETAMINOPHEN 325 MG PO TABS: 650.0000 mg | ORAL_TABLET | Freq: Four times a day (QID) | ORAL | Status: DC | PRN

## 2019-12-18 MED ORDER — PROMETHAZINE HCL 12.5 MG PO TABS: 12.5000 mg | ORAL_TABLET | Freq: Three times a day (TID) | ORAL | 0 refills | Status: DC | PRN

## 2019-12-18 MED ORDER — SODIUM CHLORIDE 0.45 % IV SOLN: INTRAVENOUS | Status: DC

## 2019-12-18 NOTE — Progress Notes (Signed)
I received a call from Judie Bonus, NP from Dr. Lanice Shirts office. Patient was seen there today and her blood pressure is soft, hr is a little high and she appears to be dehydrated. She is asking if we can see patient here today for labs and fluids as patient is adamant that she doesn't want to go to the ER, prefers to get fluids here.  She states that patient has had nausea/vomiting for about 6 weeks and has already been seen by GI and is awaiting an outpatient office visit with GI.  We will bring patient in for routine labs and fluids today as needed.

## 2019-12-18 NOTE — H&P (Deleted)
[facility] History & Physical  Traci Parker, hospital MD  Traci Parker 12/18/2019  Chief Complaint: Dr Traci Parker.  HPI: The patient is a 74 y.o. year-old w/ hx of HTN, glaucoma, DM (not on medication), anemia, asthma presenting to ED w/ c/o N/V for days to weeks.  Pt was sent from PCP's office to ED for evaluation. Also h/o ongoing wt loss.  F/b GI, Dr Laural Golden, ED spoke w/ him and they will see patient in am. In ED labs okay except for slight creat up at 1.9 from baseline 1.3.  Giving IVF's.  Asked to admit for dehydration and for further evaluation of refractory N/V.   Pt seen in ED, gives hx of nausea/ vomiting for abotu 2-3 mos, has lost about 30 lbs of weight, was 144lbs and now 113 lb or so.  Was seen and has EGD recently by GI on 5/27 which showed  - Normal hypopharynx. - Normal esophagus. - Z-line regular, 38 cm from the incisors. - Gastritis. Biopsied. - Normal pylorus. - Normal duodenal bulb and second portion of the duodenum. CT abd also showed thickening of stomach wall usually c/w gastritis as seen by EGD.   Pt reports palpitations and some lightheadness w/ exertion.  Has been to ED a few times for IVF's and she typically feels better for a short time then.  Nausea/ emesis happens w/ all different food types and most liquids. Able to keep some water down.  Hx of lap cholecystectomy 2008, no other abd surgery. NO abd pain or diarrhea.   ROS  denies CP  no joint pain   no HA  no blurry vision  no rash  no diarrhea  no dysuria  no difficulty voiding  no change in urine color    Past Medical History  Past Medical History:  Diagnosis Date  . Allergy   . Arthritis    neck  . Cataract    bilateral - MD monitoring cataracts  . CHF (congestive heart failure) ([place])   . Chronic kidney disease, stage I    DR OTTELIN  HX UTIS  . Cirrhosis ([place])   . Cramp of limb   . Diabetes mellitus   . Dysphagia, unspecified(787.20)   . Dysuria   . Epistaxis   . GERD (gastroesophageal  reflux disease)   . Heart murmur    NO CARDIOLOGIST  DX FOR YEARS ASYMPTOMATIC  . Lumbago   . Neoplasm of uncertain behavior of skin   . Nonspecific elevation of levels of transaminase or lactic acid dehydrogenase (LDH)   . Osteoarthrosis, unspecified whether generalized or localized, unspecified site   . Other and unspecified hyperlipidemia    diet controlled  . Pain in joint, shoulder region   . Paresthesias 06/05/2015  . Postablative ovarian failure   . Trochanteric bursitis of left hip 02/18/2016  . Type 2 diabetes mellitus without complication ([place])   . Unspecified essential hypertension    no meds   Past Surgical History  Past Surgical History:  Procedure Laterality Date  . BREAST BIOPSY    . CARDIAC CATHETERIZATION N/A 04/01/2016   Procedure: Left Heart Cath and Coronary Angiography;  Surgeon: Belva Crome, MD;  Location: [facility];  Service: Cardiovascular;  Laterality: N/A;  . COLONOSCOPY  2012   Dr Lajoyce Corners.   . COLONOSCOPY WITH PROPOFOL N/A 09/10/2016   Procedure: COLONOSCOPY WITH PROPOFOL;  Surgeon: Milus Banister, MD;  Location: [facility];  Service: Endoscopy;  Laterality: N/A;  . CORONARY  ARTERY BYPASS GRAFT N/A 04/02/2016   Procedure: CORONARY ARTERY BYPASS GRAFTING (CABG) x 3 USING RIGHT LEG GREATER SAPHENOUS VEIN GRAFT;  Surgeon: Melrose Nakayama, MD;  Location: [facility];  Service: Open Heart Surgery;  Laterality: N/A;  . ENDOVEIN HARVEST OF GREATER SAPHENOUS VEIN Right 04/02/2016   Procedure: ENDOVEIN HARVEST OF GREATER SAPHENOUS VEIN;  Surgeon: Melrose Nakayama, MD;  Location: [place];  Service: Open Heart Surgery;  Laterality: Right;  . ESOPHAGEAL BANDING  06/01/2019   Procedure: ESOPHAGEAL BANDING;  Surgeon: Milus Banister, MD;  Location: [facility];  Service: Endoscopy;;  . ESOPHAGEAL BANDING  06/11/2019   Procedure: ESOPHAGEAL BANDING;  Surgeon: Juanita Craver, MD;  Location: [facility] ENDOSCOPY;  Service: Endoscopy;;  . ESOPHAGOGASTRODUODENOSCOPY N/A  06/11/2019   Procedure: ESOPHAGOGASTRODUODENOSCOPY (EGD);  Surgeon: Juanita Craver, MD;  Location: [facility] ENDOSCOPY;  Service: Endoscopy;  Laterality: N/A;  . ESOPHAGOGASTRODUODENOSCOPY (EGD) WITH PROPOFOL N/A 09/10/2016   Procedure: ESOPHAGOGASTRODUODENOSCOPY (EGD) WITH PROPOFOL;  Surgeon: Milus Banister, MD;  Location: [facility];  Service: Endoscopy;  Laterality: N/A;  . ESOPHAGOGASTRODUODENOSCOPY (EGD) WITH PROPOFOL N/A 06/01/2019   Procedure: ESOPHAGOGASTRODUODENOSCOPY (EGD) WITH PROPOFOL;  Surgeon: Milus Banister, MD;  Location: [facility];  Service: Endoscopy;  Laterality: N/A;  . HEMOSTASIS CLIP PLACEMENT  06/11/2019   Procedure: HEMOSTASIS CLIP PLACEMENT;  Surgeon: Juanita Craver, MD;  Location: [place] ENDOSCOPY;  Service: Endoscopy;;  . IR ANGIOGRAM SELECTIVE EACH ADDITIONAL VESSEL  06/12/2019  . IR EMBO ART  VEN HEMORR LYMPH EXTRAV  INC GUIDE ROADMAPPING  06/12/2019  . IR PARACENTESIS  06/12/2019  . IR TIPS  06/12/2019  . MAXIMUM ACCESS (MAS)POSTERIOR LUMBAR INTERBODY FUSION (PLIF) 1 LEVEL Left 08/14/2015   Procedure: FOR MAXIMUM ACCESS (MAS) POSTERIOR LUMBAR INTERBODY FUSION (PLIF) LUMBAR THREE-FOUR EXTRAFORAMINAL MICRODISCECTOMY LUMBAR FIVE-SACRAL ONE LEFT;  Surgeon: Eustace Kewley, MD;  Location: [place] [facility];  Service: Neurosurgery;  Laterality: Left;  . RADIOLOGY WITH ANESTHESIA N/A 06/12/2019   Procedure: RADIOLOGY WITH ANESTHESIA;  Surgeon: Radiologist, Medication, MD;  Location: [place];  Service: Radiology;  Laterality: N/A;  . SCLEROTHERAPY  06/11/2019   Procedure: SCLEROTHERAPY;  Surgeon: Juanita Craver, MD;  Location: [facility] ENDOSCOPY;  Service: Endoscopy;;  . TEE WITHOUT CARDIOVERSION N/A 04/02/2016   Procedure: TRANSESOPHAGEAL ECHOCARDIOGRAM (TEE);  Surgeon: Melrose Nakayama, MD;  Location: [place];  Service: Open Heart Surgery;  Laterality: N/A;  . TUBAL LIGATION  1982   Dr Connye Burkitt  . UPPER GASTROINTESTINAL ENDOSCOPY    . VAGINAL HYSTERECTOMY  1997   Dr Rande Lawman   Family History   Family History  Problem Relation Age of Onset  . Lung cancer Father   . Arthritis Sister   . Arthritis Brother   . Heart disease Maternal Grandmother   . Heart disease Maternal Grandfather   . Heart disease Paternal Grandmother   . Heart disease Paternal Grandfather   . Breast cancer Mother   . Liver cancer Brother   . Breast cancer Maternal Aunt   . Breast cancer Paternal Aunt   . Colon cancer Neg Hx   . Esophageal cancer Neg Hx   . Rectal cancer Neg Hx   . Stomach cancer Neg Hx    Social History  reports that she has never smoked. She has never used smokeless tobacco. She reports that she does not drink alcohol or use drugs. Allergies  Allergies  Allergen Reactions  . Kiwi Extract Anaphylaxis  . Tdap [Tetanus-Diphth-Acell Pertussis] Swelling and Other (See Comments)    Swelling at injection site,  gets very hot  . Statins     RHABDOMYOLYSIS  . Latex Itching, Dermatitis and Rash  . Tramadol Nausea And Vomiting   Home medications Prior to Admission medications   Medication Sig Start Date End Date Taking? Authorizing Provider  acetaminophen (TYLENOL) 500 MG tablet Take 500 mg by mouth at bedtime.     [provider]  Aromatic Inhalants (VICKS VAPOR IN) Vicks Vapor Rub apply small amount to outside of nose to help breathing    [provider]  BD PEN NEEDLE NANO U/F 32G X 4 MM MISC USE THREE TIMES DAILY AS DIRECTED 01/30/19   Reed, Tiffany L, DO  Biotin 10000 MCG TABS Take 10,000 mcg by mouth every morning.    [provider]  bisacodyl (DULCOLAX) 10 MG suppository Place 1 suppository (10 mg total) rectally daily as needed for moderate constipation. 06/16/19   Aline August, MD  calcium carbonate (OS-CAL) 600 MG TABS Take 600 mg by mouth 2 (two) times daily with a meal.      [provider]  Cholecalciferol (VITAMIN D) 50 MCG (2000 UT) CAPS Take 2,000 Units by mouth daily.     [provider]  Cyanocobalamin (VITAMIN B 12 PO) Take  1,000 mcg by mouth daily.      [provider]  ezetimibe (ZETIA) 10 MG tablet TAKE 1 TABLET(10 MG) BY MOUTH DAILY 03/01/19   Reed, Tiffany L, DO  furosemide (LASIX) 40 MG tablet Take 40 mg by mouth daily.     [provider]  glucose blood test strip One Touch Ultra II strips. Use to test blood sugar three times daily. Dx: E11.65 08/05/17   Reed, Tiffany L, DO  insulin detemir (LEVEMIR) 100 UNIT/ML injection Inject 0.2 mLs (20 Units total) into the skin at bedtime. 06/17/19   Aline August, MD  Insulin Syringe-Needle U-100 (INSULIN SYRINGE 1CC/31GX5/16") 31G X 5/16" 1 ML MISC USE AS DIRECTED DAILY WITH LEVEMIR 09/30/18   Reed, Tiffany L, DO  JARDIANCE 25 MG TABS tablet Take 25 mg by mouth daily. 07/04/19   [provider]  lactulose (CHRONULAC) 10 GM/15ML solution Take 20 g by mouth 3 (three) times daily. 07/08/19   [provider]  loratadine (CLARITIN) 10 MG tablet Take 10 mg by mouth daily as needed for allergies.    [provider]  MAGNESIUM PO Take 500 mg by mouth 2 (two) times daily in the am and at bedtime..    [provider]  Multiple Vitamins-Minerals (MULTIVITAMIN WITH MINERALS) tablet Take 1 tablet by mouth daily.      [provider]  NOVOLOG FLEXPEN 100 UNIT/ML FlexPen Inject 12 units under the skin every morning, 8 units at lunch and 12 units at supper 04/25/19   Reed, Tiffany L, DO  ondansetron (ZOFRAN) 4 MG tablet Take 1 tablet (4 mg total) by mouth every 6 (six) hours as needed for nausea. 06/16/19   Aline August, MD  pantoprazole (PROTONIX) 40 MG tablet Take 1 tablet (40 mg total) by mouth 2 (two) times daily. 06/16/19   Aline August, MD  Polyethyl Glycol-Propyl Glycol (SYSTANE OP) Place 1 drop into both eyes 2 (two) times daily.    [provider]  Probiotic Product (PROBIOTIC DAILY PO) Take 1 capsule by mouth daily. Digestive Advantage Probiotic    [provider]  spironolactone (ALDACTONE) 50 MG  tablet Take 1 tablet (50 mg total) by mouth 2 (two) times daily. 06/19/19   Reed, [place], DO  Liver Function Tests Recent Labs  Lab 07/17/19 1436 07/19/19 1042  AST 58* 62*  ALT 41* 45*  ALKPHOS  --  127*  BILITOT 3.5* 3.4*  PROT 7.8 7.2  ALBUMIN  --  3.0*   Recent Labs  Lab 07/19/19 1042  LIPASE 29   CBC Recent Labs  Lab 07/17/19 1436 07/19/19 1042 07/19/19 1056  WBC 10.4 7.7  --   NEUTROABS 8,299* 5.9  --   HGB 16.1* 14.9 15.0  HCT 47.2* 43.4 44.0  MCV 92.5 94.6  --   PLT 151 116*  --    Basic Metabolic Panel Recent Labs  Lab 07/17/19 1436 07/19/19 1042 07/19/19 1056  NA 133* 131* 133*  K 4.5 4.6 4.6  CL 96* 97*  --   CO2 24 23  --   GLUCOSE 269* 138*  --   BUN 19 20  --   CREATININE 1.05* 1.13*  --   CALCIUM 11.1* 10.0  --    Iron/TIBC/Ferritin/ %Sat    Component Value Date/Time   IRON 29 07/11/2016 1422   TIBC 427 07/11/2016 1422   FERRITIN 13 07/11/2016 1422   IRONPCTSAT 7 (L) 07/11/2016 1422    Vitals:   07/19/19 1018 07/19/19 1030 07/19/19 1045 07/19/19 1215  BP: (!) 123/51 (!) 101/46  (!) 125/54  Pulse: 89 83    Resp: '16 15  20  ' Temp: 97.8 F (36.6 C)  97.9 F (36.6 C)   TempSrc: Oral  Rectal   SpO2: 99% 98%      Exam Gen alert, thin AAF,  No distress, calm, lying at 30 deg No rash, cyanosis or gangrene Sclera anicteric, throat clear and moist  No jvd or bruits Chest clear bilat to bases RRR no MRG, tachy Abd soft ntnd no mass or ascites +bs GU defer MS no joint effusions or deformity Ext no LE or UE edema, no wounds or ulcers Neuro is alert, Ox 3 , nf No bruits in neck, abdomen, groin    Home meds:  - norvasc 10  - epo 3000u q 14d  - prilosec 40   - prn's/ vitamins/ supplements/ eyedrops     CXR - IMPRESSION: No active disease.   Assessment/ Plan: 1. Wt loss/ refractory nausea and vomiting - going on for 2-3 mos and lost 30 lbs. GI will see in am, they are familiar w/ the patient I am told.  Give IVF's, repeat  labs in am.  So far EGD and CT have shown findings c/w gastritis.  Per GI.   2. AoCKD 3 - b/l creat 1.3 (egfr 22m/min) from Feb 2021. Creat here 1.95, suspect vol depletion. Giving IVF's. Repeat bmet in am.  3. HTN - BP's low to normal, hold norvasc for now 4. Anemia - not sure cause, pt can't remember. ? Hx of pernicious anemia per pt but B12 here is highly elevated. MCV very low 57 but iron levels are not low. Hb 8-9 range. Consider hematology referral.       RKelly Splinter MD   [facility] 07/19/2019, 1:10 PM

## 2019-12-18 NOTE — Progress Notes (Signed)
Subjective:  Patient ID: Traci Parker, female    DOB: [birth year]  Age: 74 y.o. MRN: [id number]  CC:  Chief Complaint  Patient presents with  . Weight Loss  . Eating Disorder    cant eat  . gastritis  . Emesis      HPI  This patient arrives today to establish care.  She is a 74 year old female with a past medical history significant for hypertension, osteoporosis, hyperlipidemia, and fatigue.  Her main concern today is that she has been having nausea and vomiting for approximately 4 to 6 weeks.  She tells me that she cannot determine any known trigger which started her symptoms.  She tells me she is not able to tolerate any food or liquids.  She tells me sometimes she will just get nauseated and vomit without eating or drinking.  She has already tried and failed the Zofran and metoclopramide.  She tells me she is also been losing weight unintentionally.  She has been evaluated by gastroenterology and per chart review I do see that they are still working her up for the cause of her symptoms.  She underwent esophagogastroduodenoscopy on 11/2719.  It appears that she had gastritis noted on the EGD, biopsies were taken.  I am not sure what the results are at this time.  She has also had abdominal CT scans completed and those were negative.  It appears that possible next steps would be to check cortisol level per GI note.   Past Medical History:  Diagnosis Date  . Allergy   . Anemia    pernicious anemia  . Asthma   . Blood transfusion without reported diagnosis   . Diabetes mellitus without complication ([place])   . Glaucoma   . History of blood in urine   . Hypertension   . Osteoporosis       Family History  Problem Relation Age of Onset  . Diabetes Mother   . Hyperlipidemia Mother   . Hypertension Mother   . Heart disease Father   . Hyperlipidemia Father   . Diabetes Sister   . Hyperlipidemia Sister   . Hypertension Sister   . Cancer Brother   . Heart disease Brother     . Hyperlipidemia Brother   . Hypertension Brother   . Diabetes Sister     Social History   Social History Narrative  . Not on file   Social History   Tobacco Use  . Smoking status: Former Smoker    Packs/day: 1.00    Years: 40.00    Pack years: 40.00    Quit date: 04/03/2011    Years since quitting: 8.7  . Smokeless tobacco: Never Used  Substance Use Topics  . Alcohol use: No     Current Meds  Medication Sig  . albuterol (VENTOLIN HFA) 108 (90 Base) MCG/ACT inhaler Inhale 2 puffs into the lungs every 4 (four) hours as needed for wheezing or shortness of breath.  Marland Kitchen albuterol (VENTOLIN HFA) 108 (90 Base) MCG/ACT inhaler Inhale into the lungs.  Marland Kitchen amLODipine (NORVASC) 5 MG tablet Take 2 tablets (10 mg total) by mouth daily.  . calcium-vitamin D (OSCAL WITH D) 500-200 MG-UNIT tablet Take 1 tablet by mouth daily.  . calcium-vitamin D (OSCAL WITH D) 500-200 MG-UNIT TABS tablet Take by mouth.  . cyanocobalamin (,VITAMIN B-12,) 1000 MCG/ML injection Inject 1 mL (1,000 mcg total) into the muscle every 30 (thirty) days.  . dorzolamide (TRUSOPT) 2 % ophthalmic solution Place 1  drop into both eyes 2 (two) times daily.  . dorzolamide (TRUSOPT) 2 % ophthalmic solution Apply to eye.  Marland Kitchen epoetin alfa (EPOGEN) 3000 UNIT/ML injection Inject 3,000 Units into the vein every 14 (fourteen) days.  . folic acid (FOLVITE) 1 MG tablet Take by mouth.  . latanoprost (XALATAN) 0.005 % ophthalmic solution Place 1 drop into both eyes at bedtime.  . Multiple Vitamin (MULTI-VITAMIN) tablet Take by mouth.  Marland Kitchen omeprazole (PRILOSEC) 40 MG capsule Take by mouth.  . [DISCONTINUED] metoCLOPramide (REGLAN) 5 MG tablet Take 1 tablet (5 mg total) by mouth 3 (three) times daily before meals.    ROS:  Review of Systems  Constitutional: Positive for malaise/fatigue.       (+) weakness  Gastrointestinal: Positive for nausea and vomiting. Negative for abdominal pain and diarrhea.  Neurological: Positive for  dizziness.     Objective:   Today's Vitals: BP (!) 88/58 (BP Location: Left Arm, Patient Position: Sitting, Cuff Size: Normal)   Pulse (!) 114   Temp (!) 97.5 F (36.4 C) (Temporal)   Ht '5\' 4"'  (1.626 m)   Wt 114 lb (51.7 kg)   SpO2 92%   BMI 19.57 kg/m  Vitals with BMI 12/18/2019 12/13/2019 12/13/2019  Height '5\' 4"'  - -  Weight 114 lbs - -  BMI 27.03 - -  Systolic 88 500 81  Diastolic 58 49 46  Pulse 938 94 116     Physical Exam Vitals reviewed.  Constitutional:      Appearance: Normal appearance. She is ill-appearing. She is not diaphoretic.  HENT:     Head: Normocephalic and atraumatic.     Mouth/Throat:     Mouth: Mucous membranes are pale and dry.  Neck:     Vascular: No carotid bruit.  Cardiovascular:     Rate and Rhythm: Regular rhythm. Tachycardia present.     Pulses: Normal pulses.     Heart sounds: Normal heart sounds.  Pulmonary:     Effort: Pulmonary effort is normal.     Breath sounds: Normal breath sounds.  Abdominal:     General: Abdomen is flat. Bowel sounds are decreased. There is no distension.     Palpations: There is no mass.     Tenderness: There is no abdominal tenderness.  Skin:    General: Skin is warm and dry.     Coloration: Skin is pale. Skin is not jaundiced.  Neurological:     General: No focal deficit present.     Mental Status: She is alert and oriented to person, place, and time.  Psychiatric:        Mood and Affect: Mood normal.        Behavior: Behavior normal.        Judgment: Judgment normal.          Assessment and Plan   1. Weight loss   2. Dehydration   3. Weakness   4. Nausea and vomiting, intractability of vomiting not specified, unspecified vomiting type      Plan: 1.-4.  She appears quite dehydrated on exam.  I am very concerned regarding the fact that she cannot keep any food or liquids down.  I recommended that she go to the emergency department, but she was quite hesitant initially.  She was interested in  going to the cancer center which she is familiar with for treatment of her pernicious anemia, for fluid infusion.  After speaking with the patient further and the patient's family, she is agreeable to  being evaluated emergency department.  I have called the ER physician gave him a brief history related to the patient's dehydration.  I did send a prescription for Phenergan so the patient can consider filling this when she is no longer at the hospital.  I was going to collect blood work today, however she is being sent to the emergency department, thus I will let them collect blood work today.  I have also sent urgent referral back to gastroenterologist, and we will have her follow-up next week, or after discharge if she ends up being admitted to the hospital today.  Tests ordered Orders Placed This Encounter  Procedures  . CMP with eGFR([facility])  . Lipid Panel  . Hemoglobin A1c  . TSH  . T3, Free  . T4, Free  . Ambulatory referral to Home Health  . Ambulatory referral to Gastroenterology      Meds ordered this encounter  Medications  . promethazine (PHENERGAN) 12.5 MG tablet    Sig: Take 1 tablet (12.5 mg total) by mouth every 8 (eight) hours as needed for nausea or vomiting.    Dispense:  20 tablet    Refill:  0    Order Specific Question:   Supervising Provider    Answer:   Doree Albee [[id number]]    Patient to follow-up in 1 week.  I spent greater than 60 minutes today treating this patient.  Time spent was a combination of face-to-face care, review of records, discussion with emergency medicine provider, and explanation/answering questions with the family.  Ailene Ards, NP

## 2019-12-18 NOTE — ED Triage Notes (Signed)
Pt. Came from Dr. Warrick Parisian office. Dr. Gabriel Carina called EMS stating pts. Heart rate was 115. Ems states that the pts. Heart rate has been between 102-106. Ems also states that the pt. Has had no complaints of pain just states that the pt has had vomiting for the last 2 days. Ems states the pts. CBG is 129. EMS says pts O2 is 100 on room air, B/P is 118/74.

## 2019-12-18 NOTE — ED Notes (Signed)
Report attempted x5.

## 2019-12-18 NOTE — ED Notes (Signed)
Report attempted x3

## 2019-12-18 NOTE — ED Provider Notes (Signed)
[facility] Provider Note   CSN: [id number] Arrival date & time: 12/18/19  1224     History Chief Complaint  Patient presents with  . Tachycardia    Traci Parker is a 74 y.o. female.  Patient sent in from primary care's office for hypotension.  Upon arrival here blood pressure was above 836 systolic.  Patient not been feeling well for several months.  And has been evaluated for her difficulty with eating and generalized weakness has a chronic anemia as well.  Recently had an upper endoscopy done by Dr. Melony Overly gastroenterology.  And it showed some gastritis.  She was sent in predominantly because she was hypotension and they figured she was probably dehydrated.  Patient did have seen for shortness of breath and dehydration on May 19 and discharged home.  Seen then on May 20 at [facility].  Patient's family wants her placed in nursing facility but patient refuses.  We will do work-up to see if there is a medical indication for admission and certainly if she is got orthostatic hypotension she will require admission.  Patient does live by herself.  She barely can get around with a walker.  Due to this overall weakness.  None of this is acute.  Past medical history significant for anemia pernicious anemia diabetes and hypertension.        Past Medical History:  Diagnosis Date  . Allergy   . Anemia    pernicious anemia  . Asthma   . Blood transfusion without reported diagnosis   . Diabetes mellitus without complication ([place])   . Glaucoma   . History of blood in urine   . Hypertension   . Osteoporosis     Patient Active Problem List   Diagnosis Date Noted  . Dehydration 12/13/2019  . Nausea and vomiting 10/30/2019  . Fatigue 10/30/2019  . Osteoporosis 09/20/2019  . Hypertension 09/20/2019  . Anemia 05/08/2019  . Screening for breast cancer 03/23/2019  . Hyperlipidemia 03/23/2019  . Pre-diabetes 03/23/2019  . Open-angle glaucoma 03/23/2019    Past Surgical  History:  Procedure Laterality Date  . CHOLECYSTECTOMY  2008  . COLONOSCOPY  04/30/2011   Procedure: COLONOSCOPY;  Surgeon: Rogene Houston, MD;  [facility];  Service: Endoscopy;  Laterality: N/A;  . COLONOSCOPY  05/25/2012   Procedure: COLONOSCOPY;  Surgeon: Rogene Houston, MD;  [facility];  Service: Endoscopy;  Laterality: N/A;  1:25-changed to 1200 Ann to notify pt  . COLONOSCOPY Bilateral 12/2017  . ESOPHAGOGASTRODUODENOSCOPY N/A 12/07/2019   Procedure: ESOPHAGOGASTRODUODENOSCOPY (EGD);  Surgeon: Rogene Houston, MD;  Location: [facility];  Service: Endoscopy;  Laterality: N/A;  155  . GALLBLADDER SURGERY    . right breast cystectomy  1988  . TUBAL LIGATION  1975     OB History   No obstetric history on file.     Family History  Problem Relation Age of Onset  . Diabetes Mother   . Hyperlipidemia Mother   . Hypertension Mother   . Heart disease Father   . Hyperlipidemia Father   . Diabetes Sister   . Hyperlipidemia Sister   . Hypertension Sister   . Cancer Brother   . Heart disease Brother   . Hyperlipidemia Brother   . Hypertension Brother   . Diabetes Sister     Social History   Tobacco Use  . Smoking status: Former Smoker    Packs/day: 1.00    Years: 40.00    Pack  years: 40.00    Quit date: 04/03/2011    Years since quitting: 8.7  . Smokeless tobacco: Never Used  Substance Use Topics  . Alcohol use: No  . Drug use: No    Home Medications Prior to Admission medications   Medication Sig Start Date End Date Taking? Authorizing Provider  albuterol (VENTOLIN HFA) 108 (90 Base) MCG/ACT inhaler Inhale 2 puffs into the lungs every 4 (four) hours as needed for wheezing or shortness of breath. 11/21/19  Yes Lockamy, Randi L, NP-C  amLODipine (NORVASC) 5 MG tablet Take 2 tablets (10 mg total) by mouth daily. Patient taking differently: Take 10 mg by mouth every morning.  09/20/19  Yes Corum, Rex Kras, MD  cyanocobalamin (,VITAMIN B-12,)  1000 MCG/ML injection Inject 1 mL (1,000 mcg total) into the muscle every 30 (thirty) days. 05/30/19  Yes Corum, Rex Kras, MD  dorzolamide (TRUSOPT) 2 % ophthalmic solution Place 1 drop into both eyes 2 (two) times daily.   Yes [provider]  epoetin alfa (EPOGEN) 3000 UNIT/ML injection Inject 3,000 Units into the vein every 14 (fourteen) days.   Yes Bhutani, Manpreet S, MD  folic acid (FOLVITE) 1 MG tablet Take 1 mg by mouth daily.  05/31/19  Yes [provider]  latanoprost (XALATAN) 0.005 % ophthalmic solution Place 1 drop into both eyes at bedtime.   Yes [provider]  omeprazole (PRILOSEC) 40 MG capsule Take 40 mg by mouth daily.  10/30/19   [provider]  ondansetron (ZOFRAN ODT) 4 MG disintegrating tablet Take 1 tablet (4 mg total) by mouth every 8 (eight) hours as needed for nausea or vomiting. Dissolve under tongue Patient not taking: Reported on 12/18/2019 11/21/19   Glennie Isle, NP-C  promethazine (PHENERGAN) 12.5 MG tablet Take 1 tablet (12.5 mg total) by mouth every 8 (eight) hours as needed for nausea or vomiting. 12/18/19   Ailene Ards, NP    Allergies    Latex, Levofloxacin, Neomycin-bacitracin-polymyxin  [bacitracin-neomycin-polymyxin], Other, Prednisone, Fish allergy, and Neosporin [neomycin-polymyxin-gramicidin]  Review of Systems   Review of Systems  Constitutional: Positive for fatigue. Negative for chills and fever.  HENT: Negative for rhinorrhea and sore throat.   Eyes: Negative for visual disturbance.  Respiratory: Negative for cough and shortness of breath.   Cardiovascular: Negative for chest pain and leg swelling.  Gastrointestinal: Negative for abdominal pain, diarrhea, nausea and vomiting.  Genitourinary: Negative for dysuria.  Musculoskeletal: Negative for back pain and neck pain.  Skin: Negative for rash.  Neurological: Positive for weakness. Negative for dizziness, light-headedness and headaches.  Hematological: Does  not bruise/bleed easily.  Psychiatric/Behavioral: Negative for confusion.    Physical Exam Updated Vital Signs BP 122/75   Pulse 89   Temp (!) 97.3 F (36.3 C) (Oral)   Resp 16   Ht 1.626 m (5\' 4" )   Wt 50 kg   SpO2 93%   BMI 18.92 kg/m   Physical Exam Vitals and nursing note reviewed.  Constitutional:      General: She is not in acute distress.    Appearance: Normal appearance. She is well-developed.  HENT:     Head: Normocephalic and atraumatic.  Eyes:     Conjunctiva/sclera: Conjunctivae normal.     Pupils: Pupils are equal, round, and reactive to light.  Cardiovascular:     Rate and Rhythm: Normal rate and regular rhythm.     Heart sounds: No murmur.  Pulmonary:     Effort: Pulmonary effort is normal. No  respiratory distress.     Breath sounds: Normal breath sounds.  Abdominal:     Palpations: Abdomen is soft.     Tenderness: There is no abdominal tenderness.  Musculoskeletal:     Cervical back: Neck supple.  Skin:    General: Skin is warm and dry.     Capillary Refill: Capillary refill takes less than 2 seconds.  Neurological:     General: No focal deficit present.     Mental Status: She is alert and oriented to person, place, and time.     ED Results / Procedures / Treatments   Labs (all labs ordered are listed, but only abnormal results are displayed) Labs Reviewed  CBC WITH DIFFERENTIAL/PLATELET - Abnormal; Notable for the following components:      Result Value   Hemoglobin 8.3 (*)    HCT 28.3 (*)    MCV 59.0 (*)    MCH 17.3 (*)    MCHC 29.3 (*)    RDW 23.1 (*)    nRBC 0.6 (*)    All other components within normal limits  COMPREHENSIVE METABOLIC PANEL - Abnormal; Notable for the following components:   Glucose, Bld 120 (*)    BUN 25 (*)    Creatinine, Ser 1.95 (*)    GFR calc non Af Amer 25 (*)    GFR calc Af Amer 28 (*)    Anion gap 16 (*)    All other components within normal limits  LIPASE, BLOOD  URINALYSIS, ROUTINE W REFLEX  MICROSCOPIC    EKG EKG Interpretation  Date/Time:  Monday December 18 2019 12:37:29 EDT Ventricular Rate:  100 PR Interval:    QRS Duration: 98 QT Interval:  304 QTC Calculation: 392 R Axis:   28 Text Interpretation: Sinus tachycardia Borderline T abnormalities, anterior leads Baseline wander in lead(s) V4 Confirmed by Fredia Sorrow [phone]) on 12/18/2019 1:11:11 PM   Radiology DG Chest Port 1 View  Result Date: 12/18/2019 CLINICAL DATA:  Vomiting for the past few days. EXAM: PORTABLE CHEST 1 VIEW COMPARISON:  Chest x-ray dated Nov 29, 2019. FINDINGS: The heart size and mediastinal contours are within normal limits. Both lungs are clear. The visualized skeletal structures are unremarkable. IMPRESSION: No active disease. Electronically Signed   By: Titus Dubin M.D.   On: 12/18/2019 14:58    Procedures Procedures (including critical care time)  Medications Ordered in ED Medications  0.9 %  sodium chloride infusion ( Intravenous Stopped 12/18/19 1624)  sodium chloride 0.9 % bolus 500 mL (0 mLs Intravenous Stopped 12/18/19 1624)    ED Course  I have reviewed the triage vital signs and the nursing notes.  Pertinent labs & imaging results that were available during my care of the patient were reviewed by me and considered in my medical decision making (see chart for details).    MDM Rules/Calculators/A&P                      Patient has chronic kidney disease.  Stage III.  Cannot be given large amounts of fluid.  Given 500 cc bolus.  Then gentle fluid rate.  Work-up otherwise not showing anything significant.  Think some of somewhat baseline including her anemia.  Will check orthostatic hypotension if positive patient will require admission.  I think urinalysis is still pending as well.  Patient we followed up by evening emergency physician.  Patient's family wants her placed in nursing facility or admitted.  We are looking for reasons for admission.  Patient is refusing nursing  facility.  Final Clinical Impression(s) / ED Diagnoses Final diagnoses:  Weakness    Rx / DC Orders ED Discharge Orders    None       Fredia Sorrow, MD 12/18/19 [phone]

## 2019-12-18 NOTE — ED Provider Notes (Signed)
Blood pressure 122/75, pulse 89, temperature (!) 97.3 F (36.3 C), temperature source Oral, resp. rate 16, height 5\' 4"  (1.626 m), weight 50 kg, SpO2 93 %.  Assuming care from Dr. Rogene Houston.  In short, Traci Parker is a 74 y.o. female with a chief complaint of Tachycardia .  Refer to the original H&P for additional details.  The current plan of care is to f/u after IVF.  Spoke with Dr. Laural Golden. He called to discuss the case. Could consider MRI of the abdomen w/o contrast and he can consult in the AM.  I updated the patient and checked on's house she was doing.  Patient somewhat better after IV fluids but still feels some generalized weakness.  She would benefit from additional IV fluids.  We will put her on fluid rate and call for admit.   Discussed patient's case with [facility], Dr. Jonnie Finner to request admission. Patient and family (if present) updated with plan. Care transferred to [facility] service.  I reviewed all nursing notes, vitals, pertinent old records, EKGs, labs, imaging (as available).    Traci Fast, MD 12/18/19 2014

## 2019-12-18 NOTE — Progress Notes (Signed)
Received report from ED. MD notified of multiple fluid bolus and continuous orders, requested order verification.

## 2019-12-18 NOTE — ED Notes (Addendum)
Attempted report x1. 

## 2019-12-18 NOTE — ED Notes (Signed)
Report attempted x2

## 2019-12-18 NOTE — ED Notes (Signed)
Spoke with pt. Daughter. Daughter states pt. Has been vomiting for 1 month. Daughter states the pt. Is unable to keep fluids down for a month.

## 2019-12-18 NOTE — Telephone Encounter (Signed)
EUS 12/28/19 at 1145 am at [facility] with Dr Ardis Hughs  COVID test on 12/25/19 at 1040 am No answer and no voice mail instructions in My Chart and mailed.  FYI:  The pt is currently in the ED

## 2019-12-18 NOTE — Telephone Encounter (Signed)
I spoke with Dr. Laural Golden about her this morning.  She has been losing weight and having upper GI symptoms.  EGD unrevealing.  CT scan without IV contrast suggested thickened submucosa in the stomach.  Renal insufficiency worsening over this time as well.  I think endoscopic ultrasound evaluation of the stomach is a very good next step.  She is aware that we will be contacting to set this up  Martyn Malay, Please offer her Thursday, June 17, next week, endoscopic ultrasound at [facility] with me.  Diagnosis abnormal stomach, weight loss.  thanks

## 2019-12-18 NOTE — ED Notes (Signed)
Report attempted x4.

## 2019-12-18 NOTE — ED Notes (Signed)
ED TO INPATIENT HANDOFF REPORT  ED Nurse Name and Phone #:   S Name/Age/Gender Traci Parker 74 y.o. female Room/Bed: APA10/APA10  Code Status   Code Status: Full Code  Home/SNF/Other Home Patient oriented to: self, place, time and situation Is this baseline? Yes   Triage Complete: Triage complete  Chief Complaint Nausea & vomiting [R11.2]  Triage Note Pt. Came from Dr. Warrick Parisian office. Dr. Gabriel Carina called EMS stating pts. Heart rate was 115. Ems states that the pts. Heart rate has been between 102-106. Ems also states that the pt. Has had no complaints of pain just states that the pt has had vomiting for the last 2 days. Ems states the pts. CBG is 129. EMS says pts O2 is 100 on room air, B/P is 118/74.     Allergies Allergies  Allergen Reactions  . Latex Rash  . Levofloxacin Nausea And Vomiting  . Neomycin-Bacitracin-Polymyxin  [Bacitracin-Neomycin-Polymyxin] Rash  . Other Rash and Swelling  . Prednisone Hives, Itching, Rash and Other (See Comments)  . Fish Allergy     rash  . Neosporin [Neomycin-Polymyxin-Gramicidin] Itching and Rash    Level of Care/Admitting Diagnosis ED Disposition    ED Disposition Condition [facility]: [facility] [[id number]]  Level of Care: Med-Surg [16]  Covid Evaluation: Asymptomatic Screening Protocol (No Symptoms)  Diagnosis: Nausea & vomiting [811914]  Admitting Physician: [place], [place]  Attending Physician: Roney Jaffe [[id number]]       B Medical/Surgery History Past Medical History:  Diagnosis Date  . Allergy   . Anemia    pernicious anemia  . Asthma   . Blood transfusion without reported diagnosis   . Diabetes mellitus without complication ([place])   . Glaucoma   . History of blood in urine   . Hypertension   . Osteoporosis    Past Surgical History:  Procedure Laterality Date  . CHOLECYSTECTOMY  2008  . COLONOSCOPY  04/30/2011   Procedure: COLONOSCOPY;  Surgeon: Rogene Houston, MD;   [facility];  Service: Endoscopy;  Laterality: N/A;  . COLONOSCOPY  05/25/2012   Procedure: COLONOSCOPY;  Surgeon: Rogene Houston, MD;  [facility];  Service: Endoscopy;  Laterality: N/A;  1:25-changed to 1200 Ann to notify pt  . COLONOSCOPY Bilateral 12/2017  . ESOPHAGOGASTRODUODENOSCOPY N/A 12/07/2019   Procedure: ESOPHAGOGASTRODUODENOSCOPY (EGD);  Surgeon: Rogene Houston, MD;  [facility];  Service: Endoscopy;  Laterality: N/A;  155  . GALLBLADDER SURGERY    . right breast cystectomy  1988  . TUBAL LIGATION  1975     A IV Location/Drains/Wounds Patient Lines/Drains/Airways Status   Active Line/Drains/Airways    Name:   Placement date:   Placement time:   Site:   Days:   Peripheral IV 12/18/19 Left Antecubital   12/18/19    1252    Antecubital   less than 1          Intake/Output Last 24 hours  Intake/Output Summary (Last 24 hours) at 12/18/2019 2005 Last data filed at 12/18/2019 1624 Gross per 24 hour  Intake 1000 ml  Output --  Net 1000 ml    Labs/Imaging Results for orders placed or performed during the hospital encounter of 12/18/19 (from the past 48 hour(s))  CBC with Differential/Platelet     Status: Abnormal   Collection Time: 12/18/19 12:45 PM  Result Value Ref Range   WBC 8.8 4.0 - 10.5 K/uL   RBC 4.80 3.87 -  5.11 MIL/uL   Hemoglobin 8.3 (L) 12.0 - 15.0 g/dL    Comment: Reticulocyte Hemoglobin testing may be clinically indicated, consider ordering this additional test [id number]    HCT 28.3 (L) 36.0 - 46.0 %   MCV 59.0 (L) 80.0 - 100.0 fL   MCH 17.3 (L) 26.0 - 34.0 pg   MCHC 29.3 (L) 30.0 - 36.0 g/dL   RDW 23.1 (H) 11.5 - 15.5 %   Platelets 277 150 - 400 K/uL   nRBC 0.6 (H) 0.0 - 0.2 %   Neutrophils Relative % 85 %   Neutro Abs 7.5 1.7 - 7.7 K/uL   Lymphocytes Relative 10 %   Lymphs Abs 0.9 0.7 - 4.0 K/uL   Monocytes Relative 4 %   Monocytes Absolute 0.4 0.1 - 1.0 K/uL   Eosinophils Relative 0 %   Eosinophils  Absolute 0.0 0.0 - 0.5 K/uL   Basophils Relative 0 %   Basophils Absolute 0.0 0.0 - 0.1 K/uL   Immature Granulocytes 1 %   Abs Immature Granulocytes 0.06 0.00 - 0.07 K/uL   Ovalocytes PRESENT     Comment: Performed at [facility], [address] [place], [place] [address]  Comprehensive metabolic panel     Status: Abnormal   Collection Time: 12/18/19 12:45 PM  Result Value Ref Range   Sodium 138 135 - 145 mmol/L   Potassium 3.8 3.5 - 5.1 mmol/L   Chloride 100 98 - 111 mmol/L   CO2 22 22 - 32 mmol/L   Glucose, Bld 120 (H) 70 - 99 mg/dL    Comment: Glucose reference range applies only to samples taken after fasting for at least 8 hours.   BUN 25 (H) 8 - 23 mg/dL   Creatinine, Ser 1.95 (H) 0.44 - 1.00 mg/dL   Calcium 10.0 8.9 - 10.3 mg/dL   Total Protein 7.8 6.5 - 8.1 g/dL   Albumin 4.3 3.5 - 5.0 g/dL   AST 16 15 - 41 U/L   ALT 12 0 - 44 U/L   Alkaline Phosphatase 68 38 - 126 U/L   Total Bilirubin 1.0 0.3 - 1.2 mg/dL   GFR calc non Af Amer 25 (L) >60 mL/min   GFR calc Af Amer 28 (L) >60 mL/min   Anion gap 16 (H) 5 - 15    Comment: Performed at [facility], [address], [place], [place] [address]  Lipase, blood     Status: None   Collection Time: 12/18/19 12:45 PM  Result Value Ref Range   Lipase 25 11 - 51 U/L    Comment: Performed at [facility], [address], [place], [place] [address]   DG Chest Port 1 View  Result Date: 12/18/2019 CLINICAL DATA:  Vomiting for the past few days. EXAM: PORTABLE CHEST 1 VIEW COMPARISON:  Chest x-ray dated Nov 29, 2019. FINDINGS: The heart size and mediastinal contours are within normal limits. Both lungs are clear. The visualized skeletal structures are unremarkable. IMPRESSION: No active disease. Electronically Signed   By: Titus Dubin M.D.   On: 12/18/2019 14:58    Pending Labs Unresulted Labs (From admission, onward)    Start     Ordered   12/25/19 0500  Creatinine, serum  (enoxaparin (LOVENOX)    CrCl < 30 ml/min)  Weekly,   R     Comments: while on enoxaparin therapy.    12/18/19 1812   12/19/19 6384  Basic metabolic panel  Tomorrow morning,   R     12/18/19  1812   12/19/19 0500  CBC  Tomorrow morning,   R     12/18/19 1812   12/18/19 1812  Urinalysis, Routine w reflex microscopic  Once,   STAT     12/18/19 1812   12/18/19 1727  SARS Coronavirus 2 by RT PCR (hospital order, performed in [facility] hospital lab) Nasopharyngeal Nasopharyngeal Swab  (Tier 2 (TAT 2 hrs))  ONCE - STAT,   STAT    Question Answer Comment  Is this test for diagnosis or screening Screening   Symptomatic for COVID-19 as defined by CDC No   Hospitalized for COVID-19 No   Admitted to ICU for COVID-19 No   Previously tested for COVID-19 Yes   Resident in a congregate (group) care setting No   Employed in healthcare setting No   Pregnant No   Has patient completed COVID vaccination(s) (2 doses of Pfizer/Moderna 1 dose of [employer]) No      12/18/19 1726   12/18/19 1418  Urinalysis, Routine w reflex microscopic  ONCE - STAT,   STAT     12/18/19 1417          Vitals/Pain Today's Vitals   12/18/19 1700 12/18/19 1730 12/18/19 1800 12/18/19 1805  BP: 127/73 140/77 108/64   Pulse:      Resp: 16 20 15    Temp:      TempSrc:      SpO2:      Weight:      Height:      PainSc:    0-No pain    Isolation Precautions No active isolations  Medications Medications  0.9 %  sodium chloride infusion ( Intravenous Stopped 12/18/19 1624)  0.9 %  sodium chloride infusion ( Intravenous New Bag/Given 12/18/19 1805)  enoxaparin (LOVENOX) injection 30 mg (has no administration in time range)  0.45 % sodium chloride infusion (has no administration in time range)  acetaminophen (TYLENOL) tablet 650 mg (has no administration in time range)    Or  acetaminophen (TYLENOL) suppository 650 mg (has no administration in time range)  polyethylene glycol (MIRALAX / GLYCOLAX) packet 17 g (has no administration in time range)  ondansetron (ZOFRAN)  tablet 4 mg (has no administration in time range)    Or  ondansetron (ZOFRAN) injection 4 mg (has no administration in time range)  sodium chloride 0.9 % bolus 500 mL (0 mLs Intravenous Stopped 12/18/19 1624)    Mobility non-ambulatory High fall risk   Focused Assessments    R Recommendations: See Admitting Provider Note  Report given to:   Additional Notes:

## 2019-12-19 ENCOUNTER — Encounter (HOSPITAL_COMMUNITY): Payer: Self-pay | Admitting: Nephrology

## 2019-12-19 ENCOUNTER — Observation Stay (HOSPITAL_COMMUNITY): Payer: Medicare Other

## 2019-12-19 DIAGNOSIS — R002 Palpitations: Secondary | ICD-10-CM | POA: Diagnosis present

## 2019-12-19 DIAGNOSIS — K3184 Gastroparesis: Secondary | ICD-10-CM | POA: Diagnosis present

## 2019-12-19 DIAGNOSIS — Z801 Family history of malignant neoplasm of trachea, bronchus and lung: Secondary | ICD-10-CM | POA: Diagnosis not present

## 2019-12-19 DIAGNOSIS — N179 Acute kidney failure, unspecified: Secondary | ICD-10-CM | POA: Diagnosis not present

## 2019-12-19 DIAGNOSIS — R634 Abnormal weight loss: Secondary | ICD-10-CM

## 2019-12-19 DIAGNOSIS — R112 Nausea with vomiting, unspecified: Secondary | ICD-10-CM | POA: Diagnosis not present

## 2019-12-19 DIAGNOSIS — D631 Anemia in chronic kidney disease: Secondary | ICD-10-CM | POA: Diagnosis present

## 2019-12-19 DIAGNOSIS — Z681 Body mass index (BMI) 19 or less, adult: Secondary | ICD-10-CM | POA: Diagnosis not present

## 2019-12-19 DIAGNOSIS — K2970 Gastritis, unspecified, without bleeding: Secondary | ICD-10-CM | POA: Diagnosis present

## 2019-12-19 DIAGNOSIS — Z800 Family history of malignant neoplasm of digestive organs: Secondary | ICD-10-CM | POA: Diagnosis not present

## 2019-12-19 DIAGNOSIS — E860 Dehydration: Secondary | ICD-10-CM | POA: Diagnosis present

## 2019-12-19 DIAGNOSIS — Z888 Allergy status to other drugs, medicaments and biological substances status: Secondary | ICD-10-CM | POA: Diagnosis not present

## 2019-12-19 DIAGNOSIS — H409 Unspecified glaucoma: Secondary | ICD-10-CM | POA: Diagnosis present

## 2019-12-19 DIAGNOSIS — E869 Volume depletion, unspecified: Secondary | ICD-10-CM | POA: Diagnosis not present

## 2019-12-19 DIAGNOSIS — I10 Essential (primary) hypertension: Secondary | ICD-10-CM | POA: Diagnosis not present

## 2019-12-19 DIAGNOSIS — N1832 Chronic kidney disease, stage 3b: Secondary | ICD-10-CM | POA: Diagnosis not present

## 2019-12-19 DIAGNOSIS — E1143 Type 2 diabetes mellitus with diabetic autonomic (poly)neuropathy: Secondary | ICD-10-CM | POA: Diagnosis present

## 2019-12-19 DIAGNOSIS — I129 Hypertensive chronic kidney disease with stage 1 through stage 4 chronic kidney disease, or unspecified chronic kidney disease: Secondary | ICD-10-CM | POA: Diagnosis present

## 2019-12-19 DIAGNOSIS — I959 Hypotension, unspecified: Secondary | ICD-10-CM | POA: Diagnosis present

## 2019-12-19 DIAGNOSIS — Z803 Family history of malignant neoplasm of breast: Secondary | ICD-10-CM | POA: Diagnosis not present

## 2019-12-19 DIAGNOSIS — F459 Somatoform disorder, unspecified: Secondary | ICD-10-CM | POA: Diagnosis present

## 2019-12-19 DIAGNOSIS — Z9049 Acquired absence of other specified parts of digestive tract: Secondary | ICD-10-CM | POA: Diagnosis not present

## 2019-12-19 DIAGNOSIS — J45909 Unspecified asthma, uncomplicated: Secondary | ICD-10-CM | POA: Diagnosis present

## 2019-12-19 DIAGNOSIS — E1122 Type 2 diabetes mellitus with diabetic chronic kidney disease: Secondary | ICD-10-CM | POA: Diagnosis present

## 2019-12-19 DIAGNOSIS — I6789 Other cerebrovascular disease: Secondary | ICD-10-CM | POA: Diagnosis not present

## 2019-12-19 DIAGNOSIS — Z20822 Contact with and (suspected) exposure to covid-19: Secondary | ICD-10-CM | POA: Diagnosis present

## 2019-12-19 DIAGNOSIS — R6881 Early satiety: Secondary | ICD-10-CM | POA: Diagnosis present

## 2019-12-19 DIAGNOSIS — F17210 Nicotine dependence, cigarettes, uncomplicated: Secondary | ICD-10-CM | POA: Diagnosis present

## 2019-12-19 LAB — CBC
HCT: 24.2 % — ABNORMAL LOW (ref 36.0–46.0)
Hemoglobin: 7.1 g/dL — ABNORMAL LOW (ref 12.0–15.0)
MCH: 17 pg — ABNORMAL LOW (ref 26.0–34.0)
MCHC: 29.3 g/dL — ABNORMAL LOW (ref 30.0–36.0)
MCV: 58 fL — ABNORMAL LOW (ref 80.0–100.0)
Platelets: 239 10*3/uL (ref 150–400)
RBC: 4.17 MIL/uL (ref 3.87–5.11)
RDW: 22.5 % — ABNORMAL HIGH (ref 11.5–15.5)
WBC: 8.1 10*3/uL (ref 4.0–10.5)
nRBC: 0.4 % — ABNORMAL HIGH (ref 0.0–0.2)

## 2019-12-19 LAB — BASIC METABOLIC PANEL
Anion gap: 12 (ref 5–15)
BUN: 23 mg/dL (ref 8–23)
CO2: 21 mmol/L — ABNORMAL LOW (ref 22–32)
Calcium: 9.1 mg/dL (ref 8.9–10.3)
Chloride: 107 mmol/L (ref 98–111)
Creatinine, Ser: 1.42 mg/dL — ABNORMAL HIGH (ref 0.44–1.00)
GFR calc Af Amer: 42 mL/min — ABNORMAL LOW (ref >60)
GFR calc non Af Amer: 36 mL/min — ABNORMAL LOW (ref >60)
Glucose, Bld: 91 mg/dL (ref 70–99)
Potassium: 3.6 mmol/L (ref 3.5–5.1)
Sodium: 140 mmol/L (ref 135–145)

## 2019-12-19 LAB — OCCULT BLOOD X 1 CARD TO LAB, STOOL: Fecal Occult Bld: NEGATIVE

## 2019-12-19 LAB — CORTISOL-AM, BLOOD: Cortisol - AM: 29.3 ug/dL — ABNORMAL HIGH (ref 6.7–22.6)

## 2019-12-19 MED ORDER — BOOST / RESOURCE BREEZE PO LIQD CUSTOM
1.0000 | Freq: Three times a day (TID) | ORAL | Status: DC
  Administered 2019-12-19 – 2019-12-20 (×4): 1 via ORAL

## 2019-12-19 MED ORDER — METOCLOPRAMIDE HCL 5 MG/ML IJ SOLN
5.0000 mg | Freq: Three times a day (TID) | INTRAMUSCULAR | Status: DC
  Administered 2019-12-19 – 2019-12-20 (×5): 5 mg via INTRAVENOUS
  Filled 2019-12-19 (×5): qty 2

## 2019-12-19 NOTE — Consult Note (Signed)
Referring Provider: Heath Lark, DO Primary Care Physician:  Doree Albee, MD Primary Gastroenterologist:  Dr. Laural Golden  Reason for Consultation:    Persistent nausea and vomiting.  HPI:   Patient is 74 year old F American female with multiple medical problems who was evaluated in our office initially on 12/05/2019 for nausea vomiting and weight loss.  Her symptoms started with early satiety.  She found herself eating less and less at every meal.  Then she began to have postprandial vomiting.  Vomiting would occur within few minutes of her meals and at times she would see food particles that she had eaten several hours earlier.  She has never experienced abdominal pain hematemesis melena or rectal bleeding.  She also has not experienced fever chills or night sweats.  She reports symptoms started after she took first dose of Covid vaccine.  She also went on to have second dose.  She was initially seen in the emergency room in [facility] on 11/30/2019 and underwent lab studies and CT.  CT revealed mild soft tissue stranding in the region of uncinate process of pancreas bilateral nonobstructing kidney stones without hydronephrosis and atherosclerotic aorta with aneurysm measuring 2.8 cm. Patient underwent esophagogastroduodenoscopy by me on 11/17/2019.  She was noted to have mild gastritis but no evidence of peptic ulcer disease or pyloric stenosis.  Gastric biopsy was negative for H. pylori infection or eosinophilic gastritis.  Fasting cortisol level was obtained on the day of procedure and was normal at 13.7.  Patient has not responded to therapy. She underwent repeat abdominal pelvic CT with without contrast on 12/14/2019.  No abnormality was noted to pancreas.  Aorta was ectatic with maximal diameter of 2.7 cm.  Once again stones are noted in both kidneys without obstruction.  Gastric submucosal layer appeared to be hypodense. I contacted Dr. Ardis Hughs yesterday and requested endoscopic ultrasound  examination gastric wall as well as pancreas. In the meantime patient was seen at Dr. Lanice Shirts office yesterday.  Her blood pressure was low when she was tachycardic she was felt to be dehydrated and sent over to emergency room.  Patient's hemoglobin was noted to be quite low.  She was admitted for hydration and further evaluation.  Once again she denies abdominal pain.  She vomited during the night.  She is small amount of clear liquid.  She denies hematemesis melena or rectal bleeding.  Last BM was 6 days ago.  But she is passing flatus daily.  All in all she has lost 30 pounds.  She denies headache or dizziness. Patient has history of colonic adenomas.  Her last colonoscopy was in 2019 and [place].  Next exam is due in 2022.  Patient is widowed.  She is retired.  She lives alone.  She lives in [place] and her son lives in [place].  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  She does not drink alcohol.  She smokes cigarettes for about 40 years about a pack a day but quit in 2012.  She has 3 sons and 1 daughter living.  1 daughter died 32 days after birth. Her father died of alcoholic liver disease at age 76.  Mother lived to be in her 51s. She had a brother and sister and they are both disease.  Sister had end-stage renal disease and was on dialysis for several years.  Brother had some type of malignancy.    Past Medical History:  Diagnosis Date  . Allergy   .  Anemia    pernicious anemia  . Asthma   . Blood transfusion without reported diagnosis   . Diabetes mellitus without complication ([place])   . Glaucoma   . History of blood in urine   . Hypertension   . Osteoporosis     Past Surgical History:  Procedure Laterality Date  . CHOLECYSTECTOMY  2008  . COLONOSCOPY  04/30/2011   Procedure: COLONOSCOPY;  Surgeon: Rogene Houston, MD;  [facility];  Service: Endoscopy;  Laterality: N/A;  . COLONOSCOPY  05/25/2012    Procedure: COLONOSCOPY;  Surgeon: Rogene Houston, MD;  [facility];  Service: Endoscopy;  Laterality: N/A;  1:25-changed to 1200 Ann to notify pt  . COLONOSCOPY Bilateral 12/2017  . ESOPHAGOGASTRODUODENOSCOPY N/A 12/07/2019   Procedure: ESOPHAGOGASTRODUODENOSCOPY (EGD);  Surgeon: Rogene Houston, MD;  [facility];  Service: Endoscopy;  Laterality: N/A;  155  . GALLBLADDER SURGERY    . right breast cystectomy  1988  . TUBAL LIGATION  1975    Prior to Admission medications   Medication Sig Start Date End Date Taking? Authorizing Provider  albuterol (VENTOLIN HFA) 108 (90 Base) MCG/ACT inhaler Inhale 2 puffs into the lungs every 4 (four) hours as needed for wheezing or shortness of breath. 11/21/19  Yes Lockamy, Randi L, NP-C  amLODipine (NORVASC) 5 MG tablet Take 2 tablets (10 mg total) by mouth daily. Patient taking differently: Take 10 mg by mouth every morning.  09/20/19  Yes Corum, Rex Kras, MD  cyanocobalamin (,VITAMIN B-12,) 1000 MCG/ML injection Inject 1 mL (1,000 mcg total) into the muscle every 30 (thirty) days. 05/30/19  Yes Corum, Rex Kras, MD  dorzolamide (TRUSOPT) 2 % ophthalmic solution Place 1 drop into both eyes 2 (two) times daily.   Yes [provider]  epoetin alfa (EPOGEN) 3000 UNIT/ML injection Inject 3,000 Units into the vein every 14 (fourteen) days.   Yes Bhutani, Manpreet S, MD  folic acid (FOLVITE) 1 MG tablet Take 1 mg by mouth daily.  05/31/19  Yes [provider]  latanoprost (XALATAN) 0.005 % ophthalmic solution Place 1 drop into both eyes at bedtime.   Yes [provider]  omeprazole (PRILOSEC) 40 MG capsule Take 40 mg by mouth daily.  10/30/19   [provider]  ondansetron (ZOFRAN ODT) 4 MG disintegrating tablet Take 1 tablet (4 mg total) by mouth every 8 (eight) hours as needed for nausea or vomiting. Dissolve under tongue Patient not taking: Reported on 12/18/2019 11/21/19   Glennie Isle, NP-C  promethazine  (PHENERGAN) 12.5 MG tablet Take 1 tablet (12.5 mg total) by mouth every 8 (eight) hours as needed for nausea or vomiting. 12/18/19   Ailene Ards, NP    Current Facility-Administered Medications  Medication Dose Route Frequency Provider Last Rate Last Admin  . 0.45 % sodium chloride infusion   Intravenous Continuous Roney Jaffe, MD 100 mL/hr at 12/19/19 0825 New Bag at 12/19/19 0825  . acetaminophen (TYLENOL) tablet 650 mg  650 mg Oral Q6H PRN Roney Jaffe, MD       Or  . acetaminophen (TYLENOL) suppository 650 mg  650 mg Rectal Q6H PRN Roney Jaffe, MD      . albuterol (PROVENTIL) (2.5 MG/3ML) 0.083% nebulizer solution 3 mL  3 mL Inhalation Q4H PRN Roney Jaffe, MD      . dorzolamide (TRUSOPT) 2 % ophthalmic solution 1 drop  1 drop Both Eyes BID Roney Jaffe, MD      .  enoxaparin (LOVENOX) injection 30 mg  30 mg Subcutaneous Q24H Roney Jaffe, MD   30 mg at 12/18/19 2231  . feeding supplement (BOOST / RESOURCE BREEZE) liquid 1 Container  1 Container Oral TID BM Roney Jaffe, MD   1 Container at 12/19/19 0825  . folic acid (FOLVITE) tablet 1 mg  1 mg Oral Daily Roney Jaffe, MD   1 mg at 12/19/19 0826  . latanoprost (XALATAN) 0.005 % ophthalmic solution 1 drop  1 drop Both Eyes QHS Roney Jaffe, MD   1 drop at 12/19/19 0001  . metoCLOPramide (REGLAN) injection 5 mg  5 mg Intravenous TID AC Newt Levingston U, MD      . ondansetron (ZOFRAN) tablet 4 mg  4 mg Oral Q6H PRN Roney Jaffe, MD       Or  . ondansetron (ZOFRAN) injection 4 mg  4 mg Intravenous Q6H PRN Roney Jaffe, MD      . ondansetron [facility]) injection 4 mg  4 mg Intravenous Q6H PRN Roney Jaffe, MD      . pantoprazole (PROTONIX) EC tablet 40 mg  40 mg Oral Daily Roney Jaffe, MD   40 mg at 12/19/19 0826  . polyethylene glycol (MIRALAX / GLYCOLAX) packet 17 g  17 g Oral Daily PRN Roney Jaffe, MD        Allergies as of 12/18/2019 - Review Complete 12/18/2019  Allergen Reaction Noted  .  Latex Rash 04/16/2011  . Levofloxacin Nausea And Vomiting 12/19/2012  . Neomycin-bacitracin-polymyxin  [bacitracin-neomycin-polymyxin] Rash 12/19/2012  . Other Rash and Swelling 12/19/2012  . Prednisone Hives, Itching, Rash, and Other (See Comments) 12/11/2010  . Fish allergy  05/17/2012  . Neosporin [neomycin-polymyxin-gramicidin] Itching and Rash 12/11/2010    Family History  Problem Relation Age of Onset  . Diabetes Mother   . Hyperlipidemia Mother   . Hypertension Mother   . Heart disease Father   . Hyperlipidemia Father   . Diabetes Sister   . Hyperlipidemia Sister   . Hypertension Sister   . Cancer Brother   . Heart disease Brother   . Hyperlipidemia Brother   . Hypertension Brother   . Diabetes Sister     Social History   Socioeconomic History  . Marital status: Divorced    Spouse name: Not on file  . Number of children: 4  . Years of education: Not on file  . Highest education level: Not on file  Occupational History  . Not on file  Tobacco Use  . Smoking status: Former Smoker    Packs/day: 1.00    Years: 40.00    Pack years: 40.00    Quit date: 04/03/2011    Years since quitting: 8.7  . Smokeless tobacco: Never Used  Substance and Sexual Activity  . Alcohol use: No  . Drug use: No  . Sexual activity: Not Currently  Other Topics Concern  . Not on file  Social History Narrative  . Not on file   Social Determinants of Health   Financial Resource Strain:   . Difficulty of Paying Living Expenses:   Food Insecurity:   . Worried About Charity fundraiser in the Last Year:   . Arboriculturist in the Last Year:   Transportation Needs:   . Film/video editor (Medical):   Marland Kitchen Lack of Transportation (Non-Medical):   Physical Activity:   . Days of Exercise per Week:   . Minutes of Exercise per Session:   Stress:   . Feeling of Stress :  Social Connections:   . Frequency of Communication with Friends and Family:   . Frequency of Social Gatherings  with Friends and Family:   . Attends Religious Services:   . Active Member of Clubs or Organizations:   . Attends Archivist Meetings:   Marland Kitchen Marital Status:   Intimate Partner Violence:   . Fear of Current or Ex-Partner:   . Emotionally Abused:   Marland Kitchen Physically Abused:   . Sexually Abused:     Review of Systems: See HPI, otherwise normal ROS  Physical Exam: Temp:  [97.3 F (36.3 C)-98.6 F (37 C)] 97.9 F (36.6 C) (06/08 0458) Pulse Rate:  [85-114] 98 (06/08 0458) Resp:  [13-20] 20 (06/08 0458) BP: (88-140)/(58-95) 124/73 (06/08 0458) SpO2:  [92 %-100 %] 100 % (06/08 0458) Weight:  [50 kg-51.7 kg] 50.8 kg (06/07 2320) Last BM Date: (pt stated about a week)   Well-developed thin African-American female in NAD. Conjunctiva is pale.  Sclerae nonicteric. Oropharyngeal mucosa is normal.  She is edentulous.  She does have dentures that she is not wearing at the present time. Neck without masses thyromegaly or lymphadenopathy. Cardiac exam with regular rhythm normal S1 and S2.  No murmur gallop noted. Auscultation of lungs reveal vesicular breath sounds bilaterally. Abdomen is flat.  Bowel sounds are normal.  No bruit noted.  She has laparoscopy scars.  On palpation abdomen is soft and nontender without organomegaly or masses. Extremities are thin.  No peripheral edema noted. She has clubbing.  Lab Results: Recent Labs    12/18/19 1245 12/19/19 0444  WBC 8.8 8.1  HGB 8.3* 7.1*  HCT 28.3* 24.2*  PLT 277 239   BMET Recent Labs    12/18/19 1245 12/19/19 0444  NA 138 140  K 3.8 3.6  CL 100 107  CO2 22 21*  GLUCOSE 120* 91  BUN 25* 23  CREATININE 1.95* 1.42*  CALCIUM 10.0 9.1   LFT Recent Labs    12/18/19 1245  PROT 7.8  ALBUMIN 4.3  AST 16  ALT 12  ALKPHOS 68  BILITOT 1.0   PT/INR No results for input(s): LABPROT, INR in the last 72 hours. Hepatitis Panel No results for input(s): HEPBSAG, HCVAB, HEPAIGM, HEPBIGM in the last 72  hours.  Studies/Results: DG Chest Port 1 View  Result Date: 12/18/2019 CLINICAL DATA:  Vomiting for the past few days. EXAM: PORTABLE CHEST 1 VIEW COMPARISON:  Chest x-ray dated Nov 29, 2019. FINDINGS: The heart size and mediastinal contours are within normal limits. Both lungs are clear. The visualized skeletal structures are unremarkable. IMPRESSION: No active disease. Electronically Signed   By: Titus Dubin M.D.   On: 12/18/2019 14:58    Assessment;  Patient is 74 year old female who presents with 88-month history of early satiety nausea vomiting associated with 30 pound weight loss since her symptoms started..  There is a documented weight loss of 9 pounds in the last 1 month.  She has undergone multiple studies and no etiology determined.  Gastric lining abnormal on the unenhanced CT but esophagogastroduodenoscopy prior to that study reveals gastritis and biopsy negative for H. pylori.  She was also screened for Addison's disease and fasting cortisol level came back normal.  This will be repeated.  Also need to rule out intracranial process as source of her symptoms even though she does not have headache or other neurologic symptoms.  She could also have gastroparesis given that she is diabetic and has chronic kidney disease.  Acute on chronic  anemia.  She has history of folate deficiency with folate level lately has been normal.  Her anemia is multifactorial.  She is on Epogen every 2 weeks.  No history of melena or rectal bleeding.  She is up-to-date on CRC surveillance as she has history of polyps.  Chronic kidney disease.  Recommendations;  Unenhanced head CT. Sed rate and fasting cortisol level(repeat). Hemoccult x1. Metoclopramide 5 mg IV before each meal. Endoscopic ultrasound to assess gastric wall and pancreas plan for outpatient. Further recommendations to follow.    LOS: 0 days   Concetta Guion  12/19/2019, 8:32 AM

## 2019-12-19 NOTE — Progress Notes (Signed)
PROGRESS NOTE    Traci Parker  ZOX:[id number] DOB: Apr 20, 1946 DOA: 12/18/2019 PCP: Wilson Singer, MD   Brief Narrative:  Per HPI: The patient is a 74 y.o. year-old w/ hx of HTN, glaucoma, DM (not on medication), anemia, asthma presenting to ED w/ c/o N/V for days to weeks.  Pt was sent from PCP's office to ED for evaluation. Also h/o ongoing wt loss.  F/b GI, Dr Karilyn Cota, ED spoke w/ him and they will see patient in am. In ED labs okay except for slight creat up at 1.9 from baseline 1.3.  Giving IVF's.  Asked to admit for dehydration and for further evaluation of refractory N/V.   Pt seen in ED, gives hx of nausea/ vomiting for abotu 2-3 mos, has lost about 30 lbs of weight, was 144lbs and now 113 lb or so.  Was seen and has EGD recently by GI on 5/27 which showed  - Normal hypopharynx. - Normal esophagus. - Z-line regular, 38 cm from the incisors. - Gastritis. Biopsied. - Normal pylorus. - Normal duodenal bulb and second portion of the duodenum. CT abd also showed thickening of stomach wall usually c/w gastritis as seen by EGD.   Pt reports palpitations and some lightheadness w/ exertion.  Has been to ED a few times for IVF's and she typically feels better for a short time then.  Nausea/ emesis happens w/ all different food types and most liquids. Able to keep some water down.  Hx of lap cholecystectomy 2008, no other abd surgery. NO abd pain or diarrhea.   -Patient was admitted with weight loss as well as refractory nausea and vomiting with inability to tolerate anything much orally. She is also noted to have acute on chronic kidney disease stage IIIb with improvement in creatinine levels noted this morning on IV fluid. She is also noted to have some worsening anemia, but no overt bleeding that may require some hematology evaluation in the near future. Plan to continue monitoring for now with transfusion threshold less than 7. GI, Dr. Karilyn Cota following and suspects some gastroparesis as a  potential cause. She has been started on IV Reglan scheduled. Head CT negative.  Assessment & Plan:   Principal Problem:   Refractory nausea and vomiting Active Problems:   Hypertension   Volume depletion   Weight loss   Gastritis by recent EGD   Refractory nausea and vomiting with weight loss -Suspect due to intolerance of p.o. intake related to gastroparesis -Appreciate further evaluation per GI -Head CT with no acute findings -Continue scheduled Reglan -Clear liquid diet and advance as tolerated  AKI on CKD stage IIIb -Baseline creatinine near 1.3 and current creatinine 1.42 -Continue IV fluid for now until further diet advancement -Repeat labs in a.m.  Hypertension -Currently with normal blood pressure levels, holding Norvasc for now  Anemia -Questionable prior history of pernicious anemia, but B12 is elevated -Iron levels within normal limits -Plan for likely hematology referral in the near future for further evaluation -Transfuse as needed for hemoglobin less than 7 and follow CBC   DVT prophylaxis: Lovenox Code Status: Full code Family Communication: None at bedside Disposition Plan: Further evaluation per GI regarding poor oral intake and weight loss. Status is: Observation  The patient will require care spanning > 2 midnights and should be moved to inpatient because: IV treatments appropriate due to intensity of illness or inability to take PO  Dispo: The patient is from: Home  Anticipated d/c is to: Home              Anticipated d/c date is: 1 day              Patient currently is not medically stable to d/c.   Consultants:   GI  Procedures:   See below  Antimicrobials:   None   Subjective: Patient seen and evaluated today with ongoing symptoms of nausea and vomiting, particularly when she tries to eat. She denies any frank abdominal pain, diarrhea, or any overt bleeding.  Objective: Vitals:   12/18/19 2100 12/18/19 2130  12/18/19 2320 12/19/19 0458  BP: 110/78 119/69 123/77 124/73  Pulse:   (!) 102 98  Resp: 13 14 20 20   Temp:   98.6 F (37 C) 97.9 F (36.6 C)  TempSrc:   Oral Oral  SpO2:   100% 100%  Weight:   50.8 kg   Height:   5\' 4"  (1.626 m)     Intake/Output Summary (Last 24 hours) at 12/19/2019 1238 Last data filed at 12/19/2019 0300 Gross per 24 hour  Intake 2082.68 ml  Output --  Net 2082.68 ml   Filed Weights   12/18/19 1244 12/18/19 2320  Weight: 50 kg 50.8 kg    Examination:  General exam: Appears calm and comfortable  Respiratory system: Clear to auscultation. Respiratory effort normal. Cardiovascular system: S1 & S2 heard, RRR. No JVD, murmurs, rubs, gallops or clicks. No pedal edema. Gastrointestinal system: Abdomen is nondistended, soft and nontender. No organomegaly or masses felt. Normal bowel sounds heard. Central nervous system: Alert and oriented. No focal neurological deficits. Extremities: Symmetric 5 x 5 power. Skin: No rashes, lesions or ulcers Psychiatry: Judgement and insight appear normal. Mood & affect appropriate.     Data Reviewed: I have personally reviewed following labs and imaging studies  CBC: Recent Labs  Lab 12/13/19 1322 12/18/19 1245 12/19/19 0444  WBC 8.9 8.8 8.1  NEUTROABS 6.7 7.5  --   HGB 8.2* 8.3* 7.1*  HCT 28.0* 28.3* 24.2*  MCV 58.9* 59.0* 58.0*  PLT 300 277 239   Basic Metabolic Panel: Recent Labs  Lab 12/13/19 1322 12/18/19 1245 12/19/19 0444  NA 134* 138 140  K 3.8 3.8 3.6  CL 96* 100 107  CO2 21* 22 21*  GLUCOSE 140* 120* 91  BUN 22 25* 23  CREATININE 2.21* 1.95* 1.42*  CALCIUM 9.4 10.0 9.1   GFR: Estimated Creatinine Clearance: 27.5 mL/min (A) (by C-G formula based on SCr of 1.42 mg/dL (H)). Liver Function Tests: Recent Labs  Lab 12/13/19 1322 12/18/19 1245  AST 16 16  ALT 12 12  ALKPHOS 70 68  BILITOT 1.0 1.0  PROT 7.7 7.8  ALBUMIN 4.3 4.3   Recent Labs  Lab 12/18/19 1245  LIPASE 25   No results for  input(s): AMMONIA in the last 168 hours. Coagulation Profile: No results for input(s): INR, PROTIME in the last 168 hours. Cardiac Enzymes: No results for input(s): CKTOTAL, CKMB, CKMBINDEX, TROPONINI in the last 168 hours. BNP (last 3 results) No results for input(s): PROBNP in the last 8760 hours. HbA1C: No results for input(s): HGBA1C in the last 72 hours. CBG: No results for input(s): GLUCAP in the last 168 hours. Lipid Profile: No results for input(s): CHOL, HDL, LDLCALC, TRIG, CHOLHDL, LDLDIRECT in the last 72 hours. Thyroid Function Tests: No results for input(s): TSH, T4TOTAL, FREET4, T3FREE, THYROIDAB in the last 72 hours. Anemia Panel: No results for input(s): VITAMINB12, FOLATE, FERRITIN,  TIBC, IRON, RETICCTPCT in the last 72 hours. Sepsis Labs: No results for input(s): PROCALCITON, LATICACIDVEN in the last 168 hours.  Recent Results (from the past 240 hour(s))  SARS Coronavirus 2 by RT PCR (hospital order, performed in [facility] hospital lab) Nasopharyngeal Nasopharyngeal Swab     Status: None   Collection Time: 12/18/19  5:27 PM   Specimen: Nasopharyngeal Swab  Result Value Ref Range Status   SARS Coronavirus 2 NEGATIVE NEGATIVE Final    Comment: (NOTE) SARS-CoV-2 target nucleic acids are NOT DETECTED. The SARS-CoV-2 RNA is generally detectable in upper and lower respiratory specimens during the acute phase of infection. The lowest concentration of SARS-CoV-2 viral copies this assay can detect is 250 copies / mL. A negative result does not preclude SARS-CoV-2 infection and should not be used as the sole basis for treatment or other patient management decisions.  A negative result may occur with improper specimen collection / handling, submission of specimen other than nasopharyngeal swab, presence of viral mutation(s) within the areas targeted by this assay, and inadequate number of viral copies (<250 copies / mL). A negative result must be combined with  clinical observations, patient history, and epidemiological information. Fact Sheet for Patients:   BoilerBrush.com.cy Fact Sheet for Healthcare Providers: https://pope.com/ This test is not yet approved or cleared  by the [place] FDA and has been authorized for detection and/or diagnosis of SARS-CoV-2 by FDA under an Emergency Use Authorization (EUA).  This EUA will remain in effect (meaning this test can be used) for the duration of the COVID-19 declaration under Section 564(b)(1) of the Act, 21 U.S.C. section 360bbb-3(b)(1), unless the authorization is terminated or revoked sooner. Performed at [facility], [address], [place], [place] [address]          Radiology Studies: CT HEAD WO CONTRAST  Result Date: 12/19/2019 CLINICAL DATA:  Nausea, vomiting, weight loss EXAM: CT HEAD WITHOUT CONTRAST TECHNIQUE: Contiguous axial images were obtained from the base of the skull through the vertex without intravenous contrast. COMPARISON:  None. FINDINGS: Brain: No evidence of acute infarction, hemorrhage, extra-axial collection, ventriculomegaly, or mass effect. Generalized cerebral atrophy. Periventricular white matter low attenuation likely secondary to microangiopathy. Vascular: Cerebrovascular atherosclerotic calcifications are noted. Skull: Negative for fracture or focal lesion. Sinuses/Orbits: Visualized portions of the orbits are unremarkable. Visualized portions of the paranasal sinuses are unremarkable. Visualized portions of the mastoid air cells are unremarkable. Other: None. IMPRESSION: 1. No acute intracranial pathology. 2. Chronic microvascular disease and cerebral atrophy. Electronically Signed   By: Elige Ko   On: 12/19/2019 09:25   DG Chest Port 1 View  Result Date: 12/18/2019 CLINICAL DATA:  Vomiting for the past few days. EXAM: PORTABLE CHEST 1 VIEW COMPARISON:  Chest x-ray dated Nov 29, 2019. FINDINGS: The heart size  and mediastinal contours are within normal limits. Both lungs are clear. The visualized skeletal structures are unremarkable. IMPRESSION: No active disease. Electronically Signed   By: Obie Dredge M.D.   On: 12/18/2019 14:58        Scheduled Meds: . dorzolamide  1 drop Both Eyes BID  . enoxaparin (LOVENOX) injection  30 mg Subcutaneous Q24H  . feeding supplement  1 Container Oral TID BM  . folic acid  1 mg Oral Daily  . latanoprost  1 drop Both Eyes QHS  . metoCLOPramide (REGLAN) injection  5 mg Intravenous TID AC  . pantoprazole  40 mg Oral Daily   Continuous Infusions: . sodium chloride 100 mL/hr at  12/19/19 0825     LOS: 0 days    Time spent: 35 minutes    Gearld Kerstein D Sherryll Burger, DO [facility]  If 7PM-7AM, please contact night-coverage [email or web address] 12/19/2019, 12:38 PM

## 2019-12-19 NOTE — Clinical Social Work Note (Signed)
Patient's son, Elberta Fortis, provided PCS paperwork to be completed by attending and faxed. Completed paperwork completed and faxed to [facility] for services with [facility] at [phone].     Niambi Smoak, Clydene Pugh, LCSW

## 2019-12-19 NOTE — Progress Notes (Signed)
Has had no nausea since early this morning and no bms.

## 2019-12-20 ENCOUNTER — Telehealth (INDEPENDENT_AMBULATORY_CARE_PROVIDER_SITE_OTHER): Payer: Self-pay | Admitting: Nurse Practitioner

## 2019-12-20 DIAGNOSIS — N179 Acute kidney failure, unspecified: Secondary | ICD-10-CM

## 2019-12-20 DIAGNOSIS — N1832 Chronic kidney disease, stage 3b: Secondary | ICD-10-CM

## 2019-12-20 LAB — BASIC METABOLIC PANEL
Anion gap: 14 (ref 5–15)
BUN: 17 mg/dL (ref 8–23)
CO2: 20 mmol/L — ABNORMAL LOW (ref 22–32)
Calcium: 9.4 mg/dL (ref 8.9–10.3)
Chloride: 102 mmol/L (ref 98–111)
Creatinine, Ser: 1.33 mg/dL — ABNORMAL HIGH (ref 0.44–1.00)
GFR calc Af Amer: 45 mL/min — ABNORMAL LOW (ref >60)
GFR calc non Af Amer: 39 mL/min — ABNORMAL LOW (ref >60)
Glucose, Bld: 99 mg/dL (ref 70–99)
Potassium: 3.6 mmol/L (ref 3.5–5.1)
Sodium: 136 mmol/L (ref 135–145)

## 2019-12-20 LAB — CBC
HCT: 24.2 % — ABNORMAL LOW (ref 36.0–46.0)
Hemoglobin: 7.3 g/dL — ABNORMAL LOW (ref 12.0–15.0)
MCH: 17.7 pg — ABNORMAL LOW (ref 26.0–34.0)
MCHC: 30.2 g/dL (ref 30.0–36.0)
MCV: 58.7 fL — ABNORMAL LOW (ref 80.0–100.0)
Platelets: 243 10*3/uL (ref 150–400)
RBC: 4.12 MIL/uL (ref 3.87–5.11)
RDW: 22.7 % — ABNORMAL HIGH (ref 11.5–15.5)
WBC: 7.6 10*3/uL (ref 4.0–10.5)
nRBC: 0.4 % — ABNORMAL HIGH (ref 0.0–0.2)

## 2019-12-20 MED ORDER — ONDANSETRON HCL 4 MG/2ML IJ SOLN
4.0000 mg | Freq: Three times a day (TID) | INTRAMUSCULAR | Status: DC
  Administered 2019-12-20 (×2): 4 mg via INTRAVENOUS
  Filled 2019-12-20 (×3): qty 2

## 2019-12-20 NOTE — Progress Notes (Signed)
Stated when tried to take bite of eggs this morning immediately vomited.  In emesis bag was just clear phlegm. She stated that this has been going on for at least a month and her son says she will vomit even when not trying to eat. No bms

## 2019-12-20 NOTE — Progress Notes (Signed)
PROGRESS NOTE    Traci Parker  GYJ:[id number] DOB: [birth year] DOA: 12/18/2019 PCP: Doree Albee, MD   Brief Narrative:  Per HPI: The patient is a 74 y.o. year-old w/ hx of HTN, glaucoma, DM (not on medication), anemia, asthma presenting to ED w/ c/o N/V for days to weeks.  Pt was sent from PCP's office to ED for evaluation. Also h/o ongoing wt loss.  F/b GI, Dr Laural Golden, ED spoke w/ him and they will see patient in am. In ED labs okay except for slight creat up at 1.9 from baseline 1.3.  Giving IVF's.  Asked to admit for dehydration and for further evaluation of refractory N/V.   Pt seen in ED, gives hx of nausea/ vomiting for abotu 2-3 mos, has lost about 30 lbs of weight, was 144lbs and now 113 lb or so.  Was seen and has EGD recently by GI on 5/27 which showed  - Normal hypopharynx. - Normal esophagus. - Z-line regular, 38 cm from the incisors. - Gastritis. Biopsied. - Normal pylorus. - Normal duodenal bulb and second portion of the duodenum. CT abd also showed thickening of stomach wall usually c/w gastritis as seen by EGD.   Pt reports palpitations and some lightheadness w/ exertion.  Has been to ED a few times for IVF's and she typically feels better for a short time then.  Nausea/ emesis happens w/ all different food types and most liquids. Able to keep some water down.  Hx of lap cholecystectomy 2008, no other abd surgery. NO abd pain or diarrhea.   -Patient was admitted with weight loss as well as refractory nausea and vomiting with inability to tolerate anything much orally. She is also noted to have acute on chronic kidney disease stage IIIb with improvement in creatinine levels noted this morning on IV fluid. She is also noted to have some worsening anemia, but no overt bleeding that may require some hematology evaluation in the near future. Plan to continue monitoring for now with transfusion threshold less than 7. GI, Dr. Laural Golden following and suspects some gastroparesis as a  potential cause. She has been started on IV Reglan scheduled. Head CT negative.  -Continue as needed antiemetics, continue slowly advancing diet reports no further vomiting but is still having nausea.  On clear liquid diet at this point.  Follow GI service recommendations.  Assessment & Plan:   Principal Problem:   Refractory nausea and vomiting Active Problems:   Hypertension   Volume depletion   Weight loss   Gastritis by recent EGD   Intractable nausea and vomiting   Refractory nausea and vomiting with weight loss -Suspect due to gastroparesis -Appreciate further evaluation per GI -Head CT with no acute findings -Cortisol level WNL. -Continue scheduled Reglan -Clear liquid diet and advance as tolerated -Continue as needed antiemetics.  AKI on CKD stage IIIb -Creatinine peaked at 1.95 -After fluid resuscitation and minimizing nephrotoxic agents creatinine is back to baseline again. -Continue IV fluid, but will adjust rate. -Repeat labs in a.m.  Hypertension -Currently with normal blood pressure levels -continue holding Norvasc -follow VS.  Anemia -Questionable prior history of pernicious anemia, but B12 is elevated -Iron levels within normal limits -Plan for likely hematology referral in the near future for further evaluation -Transfuse as needed for hemoglobin less than 7 and follow Hgb count. -Hgb 7.3 currently.   DVT prophylaxis: Lovenox Code Status: Full code Family Communication: None at bedside Disposition Plan: Further evaluation per GI regarding poor oral intake and  weight loss.  Status is: Inpatient   Dispo: The patient is from: home.              Anticipated d/c is to: home              Anticipated d/c date is: 1-2 days.              Patient currently no medically for discharge still having difficulty tolerating diet and experiencing nausea.  Continues to follow gastroenterology service recommendations, continue the use of Reglan and as needed  antiemetics.   Consultants:   GI  Procedures:   See below  Antimicrobials:   None   Subjective: Afebrile, no chest pain, no shortness of breath.  Reports no abdominal pain.  No signs of overt bleeding.  Still feeling nauseated but reports no further vomiting.  Objective: Vitals:   12/19/19 1442 12/19/19 2136 12/20/19 0452 12/20/19 1346  BP: 129/69 130/81 132/83 111/71  Pulse: 89 95 97 99  Resp: 15 20 18 18   Temp: 98.3 F (36.8 C) 98.9 F (37.2 C) 98.3 F (36.8 C) 99.4 F (37.4 C)  TempSrc: Oral Oral Oral Oral  SpO2: 100% 99% 99% 99%  Weight:      Height:        Intake/Output Summary (Last 24 hours) at 12/20/2019 1410 Last data filed at 12/20/2019 1300 Gross per 24 hour  Intake 600 ml  Output 1300 ml  Net -700 ml   Filed Weights   12/18/19 1244 12/18/19 2320  Weight: 50 kg 50.8 kg    Examination: General exam: Alert, awake, oriented x 3; still complaining of some nausea but no further vomiting, as per patient descriptions today.  Afebrile, no abdominal pain, no chest pain or shortness of breath. Respiratory system: Clear to auscultation. Respiratory effort normal. Cardiovascular system:RRR. No murmurs, rubs, gallops. Gastrointestinal system: Abdomen is nondistended, soft and nontender. No organomegaly or masses felt. Normal bowel sounds heard. Central nervous system: Alert and oriented. No focal neurological deficits. Extremities: No cyanosis or clubbing. Skin: No rashes, no petechiae. Psychiatry: Judgement and insight appear normal. Mood & affect appropriate.    Data Reviewed: I have personally reviewed following labs and imaging studies  CBC: Recent Labs  Lab 12/18/19 1245 12/19/19 0444 12/20/19 0608  WBC 8.8 8.1 7.6  NEUTROABS 7.5  --   --   HGB 8.3* 7.1* 7.3*  HCT 28.3* 24.2* 24.2*  MCV 59.0* 58.0* 58.7*  PLT 277 239 426   Basic Metabolic Panel: Recent Labs  Lab 12/18/19 1245 12/19/19 0444 12/20/19 0608  NA 138 140 136  K 3.8 3.6 3.6    CL 100 107 102  CO2 22 21* 20*  GLUCOSE 120* 91 99  BUN 25* 23 17  CREATININE 1.95* 1.42* 1.33*  CALCIUM 10.0 9.1 9.4   GFR: Estimated Creatinine Clearance: 29.3 mL/min (A) (by C-G formula based on SCr of 1.33 mg/dL (H)).   Liver Function Tests: Recent Labs  Lab 12/18/19 1245  AST 16  ALT 12  ALKPHOS 68  BILITOT 1.0  PROT 7.8  ALBUMIN 4.3   Recent Labs  Lab 12/18/19 1245  LIPASE 25     Recent Results (from the past 240 hour(s))  SARS Coronavirus 2 by RT PCR (hospital order, performed in [facility] hospital lab) Nasopharyngeal Nasopharyngeal Swab     Status: None   Collection Time: 12/18/19  5:27 PM   Specimen: Nasopharyngeal Swab  Result Value Ref Range Status   SARS Coronavirus 2 NEGATIVE NEGATIVE  Final    Comment: (NOTE) SARS-CoV-2 target nucleic acids are NOT DETECTED. The SARS-CoV-2 RNA is generally detectable in upper and lower respiratory specimens during the acute phase of infection. The lowest concentration of SARS-CoV-2 viral copies this assay can detect is 250 copies / mL. A negative result does not preclude SARS-CoV-2 infection and should not be used as the sole basis for treatment or other patient management decisions.  A negative result may occur with improper specimen collection / handling, submission of specimen other than nasopharyngeal swab, presence of viral mutation(s) within the areas targeted by this assay, and inadequate number of viral copies (<250 copies / mL). A negative result must be combined with clinical observations, patient history, and epidemiological information. Fact Sheet for Patients:   StrictlyIdeas.no Fact Sheet for Healthcare Providers: BankingDealers.co.za This test is not yet approved or cleared  by the [place] FDA and has been authorized for detection and/or diagnosis of SARS-CoV-2 by FDA under an Emergency Use Authorization (EUA).  This EUA will remain in effect  (meaning this test can be used) for the duration of the COVID-19 declaration under Section 564(b)(1) of the Act, 21 U.S.C. section 360bbb-3(b)(1), unless the authorization is terminated or revoked sooner. Performed at [facility], [address] [place], [place] [address]      Radiology Studies: CT HEAD WO CONTRAST  Result Date: 12/19/2019 CLINICAL DATA:  Nausea, vomiting, weight loss EXAM: CT HEAD WITHOUT CONTRAST TECHNIQUE: Contiguous axial images were obtained from the base of the skull through the vertex without intravenous contrast. COMPARISON:  None. FINDINGS: Brain: No evidence of acute infarction, hemorrhage, extra-axial collection, ventriculomegaly, or mass effect. Generalized cerebral atrophy. Periventricular white matter low attenuation likely secondary to microangiopathy. Vascular: Cerebrovascular atherosclerotic calcifications are noted. Skull: Negative for fracture or focal lesion. Sinuses/Orbits: Visualized portions of the orbits are unremarkable. Visualized portions of the paranasal sinuses are unremarkable. Visualized portions of the mastoid air cells are unremarkable. Other: None. IMPRESSION: 1. No acute intracranial pathology. 2. Chronic microvascular disease and cerebral atrophy. Electronically Signed   By: Kathreen Devoid   On: 12/19/2019 09:25   DG Chest Port 1 View  Result Date: 12/18/2019 CLINICAL DATA:  Vomiting for the past few days. EXAM: PORTABLE CHEST 1 VIEW COMPARISON:  Chest x-ray dated Nov 29, 2019. FINDINGS: The heart size and mediastinal contours are within normal limits. Both lungs are clear. The visualized skeletal structures are unremarkable. IMPRESSION: No active disease. Electronically Signed   By: Titus Dubin M.D.   On: 12/18/2019 14:58    Scheduled Meds: . dorzolamide  1 drop Both Eyes BID  . enoxaparin (LOVENOX) injection  30 mg Subcutaneous Q24H  . feeding supplement  1 Container Oral TID BM  . folic acid  1 mg Oral Daily  . latanoprost  1 drop Both  Eyes QHS  . metoCLOPramide (REGLAN) injection  5 mg Intravenous TID AC  . ondansetron (ZOFRAN) IV  4 mg Intravenous TID AC  . pantoprazole  40 mg Oral Daily   Continuous Infusions: . sodium chloride 75 mL/hr at 12/19/19 1626     LOS: 1 day    Time spent: 30 minutes    Barton Dubois, MD [facility]  If 7PM-7AM, please contact night-coverage [email or web address] 12/20/2019, 2:10 PM

## 2019-12-20 NOTE — Telephone Encounter (Signed)
I called [place] [facility] this morning on behalf of this patient.  I spoke to a staff member named Merrily Pew.  I asked him to cancel the promethazine prescription that I sent on 12/18/2019, as she has since been admitted to the hospital and I did not want there to be any confusion when she is discharged upon which medication she should fill.  He tells me he will let someone know to to cancel this order.

## 2019-12-20 NOTE — Progress Notes (Signed)
For lunch, had all of broth, jello, tea with no nausea.

## 2019-12-20 NOTE — Progress Notes (Signed)
GI Inpatient Follow-up Note  Patient Identification: Traci Parker is a 74 y.o. female admitted on December 18, 2019 for a 79-month history of nausea, weight loss, early satiety.  Found to be hypotensive and tachycardic at her primary care office and was sent to the ER.  Also noted low hemoglobin.  Work-up for symptoms has included endoscopy on 12/07/19, imaging, labs including cortisol. Has EUS scheduled later this month. See Dr Otelia Limes consult note yesterday for full details.      Subjective: Patient tolerated her clear liquid diet well yesterday.  She did develop vomiting this morning when she attempted to eat breakfast after just a few bites of egg/enlish muffin.  After having the episode of emesis her nausea completely resolved and she currently states she feels fairly well.  She has not had a bowel movement since admission.  She denies any abdominal pain.  Only complaint today is left hand stiffness and feeling pins-and-needles which was ongoing for the past week  Case discussed with her son who is at bedside.  Scheduled Inpatient Medications:  . dorzolamide  1 drop Both Eyes BID  . enoxaparin (LOVENOX) injection  30 mg Subcutaneous Q24H  . feeding supplement  1 Container Oral TID BM  . folic acid  1 mg Oral Daily  . latanoprost  1 drop Both Eyes QHS  . metoCLOPramide (REGLAN) injection  5 mg Intravenous TID AC  . pantoprazole  40 mg Oral Daily    Continuous Inpatient Infusions:   . sodium chloride 75 mL/hr at 12/19/19 1626    PRN Inpatient Medications:  acetaminophen **OR** acetaminophen, albuterol, ondansetron **OR** ondansetron (ZOFRAN) IV, ondansetron (ZOFRAN) IV, polyethylene glycol  Review of Systems: Constitutional: Weight is stable.  Eyes: No changes in vision. ENT: No oral lesions, sore throat.  GI: see HPI.  Heme/Lymph: No easy bruising.  CV: No chest pain.  GU: No hematuria.  Integumentary: No rashes.  Neuro: No headaches.  Psych: No depression/anxiety.    Endocrine: No heat/cold intolerance.  Allergic/Immunologic: No urticaria.  Resp: No cough, SOB.  Musculoskeletal: No joint swelling.    Physical Examination: BP 132/83 (BP Location: Right Arm)   Pulse 97   Temp 98.3 F (36.8 C) (Oral)   Resp 18   Ht 5\' 4"  (1.626 m)   Wt 50.8 kg   SpO2 99%   BMI 19.22 kg/m  Gen: NAD, alert and oriented x 4 HEENT: PEERLA, EOMI, Neck: supple, no JVD or thyromegaly Chest: CTA bilaterally, no wheezes, crackles, or other adventitious sounds CV: RRR, no m/g/c/r Abd: soft, NT, ND, +BS in all four quadrants; no HSM, guarding, ridigity, or rebound tenderness Ext: no edema, well perfused with 2+ pulses, Skin: no rash or lesions noted Lymph: no LAD  Data: Lab Results  Component Value Date   WBC 7.6 12/20/2019   HGB 7.3 (L) 12/20/2019   HCT 24.2 (L) 12/20/2019   MCV 58.7 (L) 12/20/2019   PLT 243 12/20/2019   Recent Labs  Lab 12/18/19 1245 12/19/19 0444 12/20/19 0608  HGB 8.3* 7.1* 7.3*   Hgb comparison in office 11/21/19--9.0  Lab Results  Component Value Date   NA 136 12/20/2019   K 3.6 12/20/2019   CL 102 12/20/2019   CO2 20 (L) 12/20/2019   BUN 17 12/20/2019   CREATININE 1.33 (H) 12/20/2019   Lab Results  Component Value Date   ALT 12 12/18/2019   AST 16 12/18/2019   ALKPHOS 68 12/18/2019   BILITOT 1.0 12/18/2019   No  results for input(s): APTT, INR, PTT in the last 168 hours.   CT a/p 12/19/19-IMPRESSION: 1. No acute intracranial pathology. 2. Chronic microvascular disease and cerebral atrophy.    Assessment/Plan: [name] is a 74 y.o. female  with 3-month history of nausea, vomiting, weight loss.  Work-up including endoscopy, CT abdomen pelvis, labs has been unremarkable.  Also had a head CT yesterday as above. Cortisol normal.   Given she did well yesterday with her clear liquid diet will transition back to clear liquids today.  We will have her continue Reglan at current dose.  We will add IV Zofran 4 mg prior to meals  scheduled to see if will decrease emesis.  Case discussed w/ Dr Laural Golden.   Please call with questions or concerns.    Ronney Asters, PA-C [facility]

## 2019-12-21 MED ORDER — ONDANSETRON HCL 4 MG PO TABS: 4.0000 mg | ORAL_TABLET | Freq: Two times a day (BID) | ORAL | 1 refills | Status: DC

## 2019-12-21 MED ORDER — METOCLOPRAMIDE HCL 5 MG PO TABS
5.0000 mg | ORAL_TABLET | Freq: Three times a day (TID) | ORAL | 1 refills | Status: DC
Start: 2019-12-21 — End: 2019-12-27

## 2019-12-21 NOTE — Discharge Summary (Signed)
Physician Discharge Summary  Traci Parker:[id number] DOB: April 09, 1946 DOA: 12/18/2019  PCP: Doree Albee, MD  Admit date: 12/18/2019  Discharge date: 12/21/2019  Admitted From:Home  Disposition:  Home  Recommendations for Outpatient Follow-up:  1. Follow up with PCP in 1-2 weeks 2. Follow up with Dr. Laural Golden in 2-4 weeks 3. Follow-up in [place] for endoscopic ultrasound scheduled for 12/28/2019 with information provided to the patient 4. Remain on Reglan 5 mg oral 3 times daily as prescribed as well as Zofran twice daily  Home Health: None  Equipment/Devices: None  Discharge Condition: Stable  CODE STATUS: Full  Diet recommendation: Heart Healthy/carb modified  Brief/Interim Summary: Per HPI: The patient is a25 y.o.year-old w/ hx of HTN, glaucoma, DM (not on medication), anemia, asthma presenting to ED w/ c/o N/V for days to weeks. Pt was sent from PCP's office to ED for evaluation. Also h/o ongoing wt loss. F/b GI, Dr Laural Golden, ED spoke w/ him and they will see patient in am. In ED labs okay except for slight creat up at 1.9 from baseline 1.3. Giving IVF's. Asked to admit for dehydration and for further evaluation of refractory N/V.   Pt seen in ED, gives hx of nausea/ vomiting for abotu 2-3 mos, has lost about 30 lbs of weight, was 144lbs and now 113 lb or so. Was seen and has EGD recently by GI on 5/27 which showed  -Normal hypopharynx. - Normal esophagus. - Z-line regular, 38 cm from the incisors. - Gastritis. Biopsied. - Normal pylorus. - Normal duodenal bulb and second portion of the duodenum. CT abd also showed thickening of stomach wall usually c/w gastritis as seen by EGD.   Pt reports palpitations and some lightheadness w/ exertion. Has been to ED a few times for IVF's and she typically feels better for a short time then. Nausea/ emesis happens w/ all different food types and most liquids. Able to keep some water down. Hx of lap cholecystectomy 2008,  no other abd surgery. NO abd pain or diarrhea.   -Patient was admitted with weight loss as well as refractory nausea and vomiting with inability to tolerate anything much orally. She is also noted to have acute on chronic kidney disease stage IIIb with improvement in creatinine levels noted this morning on IV fluid. She is also noted to have some worsening anemia, but no overt bleeding that may require some hematology evaluation in the near future. Plan to continue monitoring for now with transfusion threshold less than 7. GI, Dr. Laural Golden following and suspects some gastroparesis as a potential cause. She has been started on IV Reglan scheduled. Head CT negative.  -At this point, patient is requesting to be discharged, and is otherwise stable with no further inpatient evaluation plan.  It appears that she is able to tolerate some of her full liquid, but she still has some nausea and vomiting for which GI has recommended 3 times daily Reglan as well as Zofran twice daily as prescribed.  She will have follow-up with endoscopic ultrasound in [facility] on the 17th for further evaluation and will follow up with GI as otherwise scheduled.  No other acute concerns currently noted.  It is suspected that she may have some gastroparesis versus psychogenic cause for her persistent nausea and vomiting.  She is stable for discharge.  Discharge Diagnoses:  Principal Problem:   Refractory nausea and vomiting Active Problems:   Hypertension   Volume depletion   Weight loss   Gastritis by recent EGD  Intractable nausea and vomiting  Principal discharge diagnosis: Refractory nausea and vomiting with weight loss likely secondary to either gastroparesis or psychogenic cause.  Discharge Instructions  Discharge Instructions    Diet - low sodium heart healthy   Complete by: As directed    Increase activity slowly   Complete by: As directed      Allergies as of 12/21/2019      Reactions   Latex Rash    Levofloxacin Nausea And Vomiting   Neomycin-bacitracin-polymyxin  [bacitracin-neomycin-polymyxin] Rash   Other Rash, Swelling   Prednisone Hives, Itching, Rash, Other (See Comments)   Fish Allergy    rash   Neosporin [neomycin-polymyxin-gramicidin] Itching, Rash      Medication List    TAKE these medications   albuterol 108 (90 Base) MCG/ACT inhaler Commonly known as: VENTOLIN HFA Inhale 2 puffs into the lungs every 4 (four) hours as needed for wheezing or shortness of breath.   amLODipine 5 MG tablet Commonly known as: NORVASC Take 2 tablets (10 mg total) by mouth daily. What changed: when to take this   cyanocobalamin 1000 MCG/ML injection Commonly known as: (VITAMIN B-12) Inject 1 mL (1,000 mcg total) into the muscle every 30 (thirty) days.   dorzolamide 2 % ophthalmic solution Commonly known as: TRUSOPT Place 1 drop into both eyes 2 (two) times daily.   epoetin alfa 3000 UNIT/ML injection Commonly known as: EPOGEN Inject 3,000 Units into the vein every 14 (fourteen) days.   folic acid 1 MG tablet Commonly known as: FOLVITE Take 1 mg by mouth daily.   latanoprost 0.005 % ophthalmic solution Commonly known as: XALATAN Place 1 drop into both eyes at bedtime.   metoCLOPramide 5 MG tablet Commonly known as: Reglan Take 1 tablet (5 mg total) by mouth 3 (three) times daily.   omeprazole 40 MG capsule Commonly known as: PRILOSEC Take 40 mg by mouth daily.   ondansetron 4 MG disintegrating tablet Commonly known as: Zofran ODT Take 1 tablet (4 mg total) by mouth every 8 (eight) hours as needed for nausea or vomiting. Dissolve under tongue   ondansetron 4 MG tablet Commonly known as: Zofran Take 1 tablet (4 mg total) by mouth 2 (two) times daily.   promethazine 12.5 MG tablet Commonly known as: PHENERGAN Take 1 tablet (12.5 mg total) by mouth every 8 (eight) hours as needed for nausea or vomiting.       Follow-up Information    Gosrani, Nimish C, MD Follow up  in 1 week(s).   Specialty: Internal Medicine Contact information: [place] [address] [phone]        Rogene Houston, MD Follow up in 2 week(s).   Specialty: Gastroenterology Contact information: [address], [address] [place] [address] [phone]              Allergies  Allergen Reactions  . Latex Rash  . Levofloxacin Nausea And Vomiting  . Neomycin-Bacitracin-Polymyxin  [Bacitracin-Neomycin-Polymyxin] Rash  . Other Rash and Swelling  . Prednisone Hives, Itching, Rash and Other (See Comments)  . Fish Allergy     rash  . Neosporin [Neomycin-Polymyxin-Gramicidin] Itching and Rash    Consultations:  GI   Procedures/Studies: CT ABDOMEN PELVIS WO CONTRAST  Addendum Date: 12/15/2019   ADDENDUM REPORT: 12/15/2019 17:13 ADDENDUM: Aortic dilation maximal caliber 2.7 cm of the infrarenal abdominal aorta. Ectatic abdominal aorta at risk for aneurysm development. Recommend followup by ultrasound in 5 years. This recommendation follows ACR consensus guidelines: White Paper  of the ACR Incidental Findings Committee II on Vascular Findings. J Am Coll Radiol 2013; 10:789-794. These results will be called to the ordering clinician or representative by the Radiologist Assistant, and communication documented in the PACS or [employer]. Electronically Signed   By: Zetta Bills M.D.   On: 12/15/2019 17:13   Result Date: 12/15/2019 CLINICAL DATA:  Nausea vomiting and unintended weight loss. EXAM: CT ABDOMEN AND PELVIS WITHOUT CONTRAST TECHNIQUE: Multidetector CT imaging of the abdomen and pelvis was performed following the standard protocol without IV contrast. COMPARISON:  Nausea vomiting and under a tended weight loss FINDINGS: Lower chest: Incidental imaging of the lung bases is unremarkable. Hepatobiliary: Liver normal size and contour. Small low-density focus in the posterior RIGHT hepatic lobe measures near water density likely a small cyst. Post  cholecystectomy. No gross biliary ductal dilation. Pancreas: No peripancreatic inflammation or gross ductal dilation. Spleen: Spleen normal size and contour. Adrenals/Urinary Tract: Adrenal glands are normal. Bilateral nephrolithiasis. Two on the RIGHT measuring between 2 and 3 mm. A single calculus approximately 2-3 mm in the LEFT upper pole. Low-density lesion arising from lower pole of the LEFT kidney measures water density likely a small cyst. Urinary bladder is unremarkable. No sign of ureteral calculus or hydronephrosis. Stomach/Bowel: Stomach under distended limiting assessment. Low-density generalized within the submucosa of the stomach. No acute small bowel process. No pericolonic stranding. Appendix not visualized but without pericecal inflammation. Vascular/Lymphatic: Calcified atheromatous plaque in the abdominal aorta. Max caliber 2.7 x 2.7 cm (image 29 of series 2) no adenopathy in the retroperitoneum or in the upper abdomen. No pelvic lymphadenopathy. Reproductive: Uterus and adnexa are unremarkable by CT. Other: No free air.  No ascites. Musculoskeletal: Spinal degenerative changes. No acute or destructive bone process. IMPRESSION: 1. Bilateral nephrolithiasis without sign of ureteral calculus or hydronephrosis. 2. Low-density generalized within the submucosa of the stomach. This is likely related to known gastritis. 3. Aortic atherosclerosis. Aortic Atherosclerosis (ICD10-I70.0). Electronically Signed: By: Zetta Bills M.D. On: 12/15/2019 12:52   CT HEAD WO CONTRAST  Result Date: 12/19/2019 CLINICAL DATA:  Nausea, vomiting, weight loss EXAM: CT HEAD WITHOUT CONTRAST TECHNIQUE: Contiguous axial images were obtained from the base of the skull through the vertex without intravenous contrast. COMPARISON:  None. FINDINGS: Brain: No evidence of acute infarction, hemorrhage, extra-axial collection, ventriculomegaly, or mass effect. Generalized cerebral atrophy. Periventricular white matter low  attenuation likely secondary to microangiopathy. Vascular: Cerebrovascular atherosclerotic calcifications are noted. Skull: Negative for fracture or focal lesion. Sinuses/Orbits: Visualized portions of the orbits are unremarkable. Visualized portions of the paranasal sinuses are unremarkable. Visualized portions of the mastoid air cells are unremarkable. Other: None. IMPRESSION: 1. No acute intracranial pathology. 2. Chronic microvascular disease and cerebral atrophy. Electronically Signed   By: Kathreen Devoid   On: 12/19/2019 09:25   DG Chest Port 1 View  Result Date: 12/18/2019 CLINICAL DATA:  Vomiting for the past few days. EXAM: PORTABLE CHEST 1 VIEW COMPARISON:  Chest x-ray dated Nov 29, 2019. FINDINGS: The heart size and mediastinal contours are within normal limits. Both lungs are clear. The visualized skeletal structures are unremarkable. IMPRESSION: No active disease. Electronically Signed   By: Titus Dubin M.D.   On: 12/18/2019 14:58   DG Chest Port 1 View  Result Date: 11/29/2019 CLINICAL DATA:  Shortness of breath EXAM: PORTABLE CHEST 1 VIEW COMPARISON:  2008, no recent imaging FINDINGS: No consolidation or edema. No pleural effusion or pneumothorax. Question of mild background changes of emphysema. Cardiomediastinal contours  are within normal limits with normal heart size. IMPRESSION: No acute process in the chest. Question of mild background changes of emphysema. Electronically Signed   By: Macy Mis M.D.   On: 11/29/2019 13:59    Discharge Exam: Vitals:   12/20/19 1346 12/20/19 2123  BP: 111/71 128/72  Pulse: 99 100  Resp: 18 20  Temp: 99.4 F (37.4 C) 99.3 F (37.4 C)  SpO2: 99% 99%   Vitals:   12/19/19 2136 12/20/19 0452 12/20/19 1346 12/20/19 2123  BP: 130/81 132/83 111/71 128/72  Pulse: 95 97 99 100  Resp: 20 18 18 20   Temp: 98.9 F (37.2 C) 98.3 F (36.8 C) 99.4 F (37.4 C) 99.3 F (37.4 C)  TempSrc: Oral Oral Oral Oral  SpO2: 99% 99% 99% 99%  Weight:       Height:        General: Pt is alert, awake, not in acute distress Cardiovascular: RRR, S1/S2 +, no rubs, no gallops Respiratory: CTA bilaterally, no wheezing, no rhonchi Abdominal: Soft, NT, ND, bowel sounds + Extremities: no edema, no cyanosis    The results of significant diagnostics from this hospitalization (including imaging, microbiology, ancillary and laboratory) are listed below for reference.     Microbiology: Recent Results (from the past 240 hour(s))  SARS Coronavirus 2 by RT PCR (hospital order, performed in [facility] hospital lab) Nasopharyngeal Nasopharyngeal Swab     Status: None   Collection Time: 12/18/19  5:27 PM   Specimen: Nasopharyngeal Swab  Result Value Ref Range Status   SARS Coronavirus 2 NEGATIVE NEGATIVE Final    Comment: (NOTE) SARS-CoV-2 target nucleic acids are NOT DETECTED. The SARS-CoV-2 RNA is generally detectable in upper and lower respiratory specimens during the acute phase of infection. The lowest concentration of SARS-CoV-2 viral copies this assay can detect is 250 copies / mL. A negative result does not preclude SARS-CoV-2 infection and should not be used as the sole basis for treatment or other patient management decisions.  A negative result may occur with improper specimen collection / handling, submission of specimen other than nasopharyngeal swab, presence of viral mutation(s) within the areas targeted by this assay, and inadequate number of viral copies (<250 copies / mL). A negative result must be combined with clinical observations, patient history, and epidemiological information. Fact Sheet for Patients:   StrictlyIdeas.no Fact Sheet for Healthcare Providers: BankingDealers.co.za This test is not yet approved or cleared  by the [place] FDA and has been authorized for detection and/or diagnosis of SARS-CoV-2 by FDA under an Emergency Use Authorization (EUA).  This EUA will  remain in effect (meaning this test can be used) for the duration of the COVID-19 declaration under Section 564(b)(1) of the Act, 21 U.S.C. section 360bbb-3(b)(1), unless the authorization is terminated or revoked sooner. Performed at [facility], [address] [place], [place] [address]      Labs: BNP (last 3 results) Recent Labs    11/29/19 1343  BNP 97.3   Basic Metabolic Panel: Recent Labs  Lab 12/18/19 1245 12/19/19 0444 12/20/19 0608  NA 138 140 136  K 3.8 3.6 3.6  CL 100 107 102  CO2 22 21* 20*  GLUCOSE 120* 91 99  BUN 25* 23 17  CREATININE 1.95* 1.42* 1.33*  CALCIUM 10.0 9.1 9.4   Liver Function Tests: Recent Labs  Lab 12/18/19 1245  AST 16  ALT 12  ALKPHOS 68  BILITOT 1.0  PROT 7.8  ALBUMIN 4.3   Recent Labs  Lab 12/18/19 1245  LIPASE 25   No results for input(s): AMMONIA in the last 168 hours. CBC: Recent Labs  Lab 12/18/19 1245 12/19/19 0444 12/20/19 0608  WBC 8.8 8.1 7.6  NEUTROABS 7.5  --   --   HGB 8.3* 7.1* 7.3*  HCT 28.3* 24.2* 24.2*  MCV 59.0* 58.0* 58.7*  PLT 277 239 243   Cardiac Enzymes: No results for input(s): CKTOTAL, CKMB, CKMBINDEX, TROPONINI in the last 168 hours. BNP: Invalid input(s): POCBNP CBG: No results for input(s): GLUCAP in the last 168 hours. D-Dimer No results for input(s): DDIMER in the last 72 hours. Hgb A1c No results for input(s): HGBA1C in the last 72 hours. Lipid Profile No results for input(s): CHOL, HDL, LDLCALC, TRIG, CHOLHDL, LDLDIRECT in the last 72 hours. Thyroid function studies No results for input(s): TSH, T4TOTAL, T3FREE, THYROIDAB in the last 72 hours.  Invalid input(s): FREET3 Anemia work up No results for input(s): VITAMINB12, FOLATE, FERRITIN, TIBC, IRON, RETICCTPCT in the last 72 hours. Urinalysis    Component Value Date/Time   COLORURINE YELLOW 12/18/2019 2017   APPEARANCEUR HAZY (A) 12/18/2019 2017   LABSPEC 1.012 12/18/2019 2017   PHURINE 5.0 12/18/2019 2017   GLUCOSEU  NEGATIVE 12/18/2019 2017   HGBUR MODERATE (A) 12/18/2019 2017   BILIRUBINUR NEGATIVE 12/18/2019 2017   BILIRUBINUR negative 03/23/2019 1015   KETONESUR 5 (A) 12/18/2019 2017   PROTEINUR NEGATIVE 12/18/2019 2017   UROBILINOGEN 1.0 03/23/2019 1015   UROBILINOGEN >8.0 (H) 04/15/2007 2055   NITRITE NEGATIVE 12/18/2019 2017   LEUKOCYTESUR MODERATE (A) 12/18/2019 2017   Sepsis Labs Invalid input(s): PROCALCITONIN,  WBC,  LACTICIDVEN Microbiology Recent Results (from the past 240 hour(s))  SARS Coronavirus 2 by RT PCR (hospital order, performed in [place] hospital lab) Nasopharyngeal Nasopharyngeal Swab     Status: None   Collection Time: 12/18/19  5:27 PM   Specimen: Nasopharyngeal Swab  Result Value Ref Range Status   SARS Coronavirus 2 NEGATIVE NEGATIVE Final    Comment: (NOTE) SARS-CoV-2 target nucleic acids are NOT DETECTED. The SARS-CoV-2 RNA is generally detectable in upper and lower respiratory specimens during the acute phase of infection. The lowest concentration of SARS-CoV-2 viral copies this assay can detect is 250 copies / mL. A negative result does not preclude SARS-CoV-2 infection and should not be used as the sole basis for treatment or other patient management decisions.  A negative result may occur with improper specimen collection / handling, submission of specimen other than nasopharyngeal swab, presence of viral mutation(s) within the areas targeted by this assay, and inadequate number of viral copies (<250 copies / mL). A negative result must be combined with clinical observations, patient history, and epidemiological information. Fact Sheet for Patients:   StrictlyIdeas.no Fact Sheet for Healthcare Providers: BankingDealers.co.za This test is not yet approved or cleared  by the [place] FDA and has been authorized for detection and/or diagnosis of SARS-CoV-2 by FDA under an Emergency Use Authorization  (EUA).  This EUA will remain in effect (meaning this test can be used) for the duration of the COVID-19 declaration under Section 564(b)(1) of the Act, 21 U.S.C. section 360bbb-3(b)(1), unless the authorization is terminated or revoked sooner. Performed at [facility], [address] [place], [place] [address]      Time coordinating discharge: 35 minutes  SIGNED:   Rodena Goldmann, DO [facility] 12/21/2019, 11:04 AM  If 7PM-7AM, please contact night-coverage [email or web address]

## 2019-12-21 NOTE — Plan of Care (Signed)

## 2019-12-23 ENCOUNTER — Inpatient Hospital Stay (HOSPITAL_COMMUNITY)
Admission: EM | Admit: 2019-12-23 | Discharge: 2019-12-27 | DRG: 641 | Disposition: A | Discharge status: Skilled Nursing Facility | Payer: Medicare Other | Attending: Family Medicine | Admitting: Family Medicine

## 2019-12-23 ENCOUNTER — Other Ambulatory Visit: Payer: Self-pay

## 2019-12-23 ENCOUNTER — Emergency Department (HOSPITAL_COMMUNITY): Payer: Medicare Other

## 2019-12-23 ENCOUNTER — Encounter (HOSPITAL_COMMUNITY): Payer: Self-pay

## 2019-12-23 DIAGNOSIS — E1151 Type 2 diabetes mellitus with diabetic peripheral angiopathy without gangrene: Secondary | ICD-10-CM

## 2019-12-23 DIAGNOSIS — M810 Age-related osteoporosis without current pathological fracture: Secondary | ICD-10-CM | POA: Diagnosis present

## 2019-12-23 DIAGNOSIS — E1122 Type 2 diabetes mellitus with diabetic chronic kidney disease: Secondary | ICD-10-CM | POA: Diagnosis not present

## 2019-12-23 DIAGNOSIS — IMO0001 Reserved for inherently not codable concepts without codable children: Secondary | ICD-10-CM

## 2019-12-23 DIAGNOSIS — R069 Unspecified abnormalities of breathing: Secondary | ICD-10-CM | POA: Diagnosis not present

## 2019-12-23 DIAGNOSIS — I10 Essential (primary) hypertension: Secondary | ICD-10-CM | POA: Diagnosis not present

## 2019-12-23 DIAGNOSIS — D649 Anemia, unspecified: Secondary | ICD-10-CM

## 2019-12-23 DIAGNOSIS — R823 Hemoglobinuria: Secondary | ICD-10-CM | POA: Diagnosis present

## 2019-12-23 DIAGNOSIS — H4010X0 Unspecified open-angle glaucoma, stage unspecified: Secondary | ICD-10-CM | POA: Diagnosis present

## 2019-12-23 DIAGNOSIS — E876 Hypokalemia: Secondary | ICD-10-CM | POA: Diagnosis not present

## 2019-12-23 DIAGNOSIS — E785 Hyperlipidemia, unspecified: Secondary | ICD-10-CM | POA: Diagnosis present

## 2019-12-23 DIAGNOSIS — Z681 Body mass index (BMI) 19 or less, adult: Secondary | ICD-10-CM

## 2019-12-23 DIAGNOSIS — R911 Solitary pulmonary nodule: Secondary | ICD-10-CM | POA: Diagnosis not present

## 2019-12-23 DIAGNOSIS — E860 Dehydration: Secondary | ICD-10-CM

## 2019-12-23 DIAGNOSIS — D510 Vitamin B12 deficiency anemia due to intrinsic factor deficiency: Secondary | ICD-10-CM | POA: Diagnosis not present

## 2019-12-23 DIAGNOSIS — R627 Adult failure to thrive: Principal | ICD-10-CM | POA: Diagnosis present

## 2019-12-23 DIAGNOSIS — R824 Acetonuria: Secondary | ICD-10-CM | POA: Diagnosis not present

## 2019-12-23 DIAGNOSIS — Z833 Family history of diabetes mellitus: Secondary | ICD-10-CM

## 2019-12-23 DIAGNOSIS — E440 Moderate protein-calorie malnutrition: Secondary | ICD-10-CM | POA: Diagnosis present

## 2019-12-23 DIAGNOSIS — R634 Abnormal weight loss: Secondary | ICD-10-CM | POA: Diagnosis present

## 2019-12-23 DIAGNOSIS — Z888 Allergy status to other drugs, medicaments and biological substances status: Secondary | ICD-10-CM

## 2019-12-23 DIAGNOSIS — R296 Repeated falls: Secondary | ICD-10-CM | POA: Diagnosis not present

## 2019-12-23 DIAGNOSIS — Z8249 Family history of ischemic heart disease and other diseases of the circulatory system: Secondary | ICD-10-CM

## 2019-12-23 DIAGNOSIS — I129 Hypertensive chronic kidney disease with stage 1 through stage 4 chronic kidney disease, or unspecified chronic kidney disease: Secondary | ICD-10-CM | POA: Diagnosis not present

## 2019-12-23 DIAGNOSIS — Z9049 Acquired absence of other specified parts of digestive tract: Secondary | ICD-10-CM | POA: Diagnosis not present

## 2019-12-23 DIAGNOSIS — D631 Anemia in chronic kidney disease: Secondary | ICD-10-CM | POA: Diagnosis present

## 2019-12-23 DIAGNOSIS — E1143 Type 2 diabetes mellitus with diabetic autonomic (poly)neuropathy: Secondary | ICD-10-CM | POA: Diagnosis present

## 2019-12-23 DIAGNOSIS — Z87891 Personal history of nicotine dependence: Secondary | ICD-10-CM

## 2019-12-23 DIAGNOSIS — D509 Iron deficiency anemia, unspecified: Secondary | ICD-10-CM | POA: Diagnosis present

## 2019-12-23 DIAGNOSIS — Z79899 Other long term (current) drug therapy: Secondary | ICD-10-CM

## 2019-12-23 DIAGNOSIS — R202 Paresthesia of skin: Secondary | ICD-10-CM | POA: Diagnosis not present

## 2019-12-23 DIAGNOSIS — N1831 Chronic kidney disease, stage 3a: Secondary | ICD-10-CM | POA: Diagnosis present

## 2019-12-23 DIAGNOSIS — Z743 Need for continuous supervision: Secondary | ICD-10-CM | POA: Diagnosis not present

## 2019-12-23 DIAGNOSIS — R0602 Shortness of breath: Secondary | ICD-10-CM | POA: Diagnosis not present

## 2019-12-23 DIAGNOSIS — D563 Thalassemia minor: Secondary | ICD-10-CM | POA: Diagnosis present

## 2019-12-23 DIAGNOSIS — R000 Tachycardia, unspecified: Secondary | ICD-10-CM | POA: Diagnosis not present

## 2019-12-23 DIAGNOSIS — K3184 Gastroparesis: Secondary | ICD-10-CM | POA: Diagnosis present

## 2019-12-23 DIAGNOSIS — Z91040 Latex allergy status: Secondary | ICD-10-CM

## 2019-12-23 DIAGNOSIS — J449 Chronic obstructive pulmonary disease, unspecified: Secondary | ICD-10-CM | POA: Diagnosis not present

## 2019-12-23 DIAGNOSIS — R531 Weakness: Secondary | ICD-10-CM | POA: Diagnosis present

## 2019-12-23 DIAGNOSIS — E119 Type 2 diabetes mellitus without complications: Secondary | ICD-10-CM

## 2019-12-23 DIAGNOSIS — Z881 Allergy status to other antibiotic agents status: Secondary | ICD-10-CM

## 2019-12-23 DIAGNOSIS — Z83438 Family history of other disorder of lipoprotein metabolism and other lipidemia: Secondary | ICD-10-CM

## 2019-12-23 DIAGNOSIS — Z20822 Contact with and (suspected) exposure to covid-19: Secondary | ICD-10-CM | POA: Diagnosis not present

## 2019-12-23 DIAGNOSIS — W19XXXA Unspecified fall, initial encounter: Secondary | ICD-10-CM | POA: Diagnosis present

## 2019-12-23 HISTORY — DX: Type 2 diabetes mellitus with diabetic peripheral angiopathy without gangrene: E11.51

## 2019-12-23 HISTORY — DX: Chronic obstructive pulmonary disease, unspecified: J44.9

## 2019-12-23 HISTORY — DX: Type 2 diabetes mellitus without complications: E11.9

## 2019-12-23 LAB — COMPREHENSIVE METABOLIC PANEL
ALT: 11 U/L (ref 0–44)
AST: 13 U/L — ABNORMAL LOW (ref 15–41)
Albumin: 4.1 g/dL (ref 3.5–5.0)
Alkaline Phosphatase: 54 U/L (ref 38–126)
Anion gap: 13 (ref 5–15)
BUN: 15 mg/dL (ref 8–23)
CO2: 21 mmol/L — ABNORMAL LOW (ref 22–32)
Calcium: 9.4 mg/dL (ref 8.9–10.3)
Chloride: 103 mmol/L (ref 98–111)
Creatinine, Ser: 1.46 mg/dL — ABNORMAL HIGH (ref 0.44–1.00)
GFR calc Af Amer: 40 mL/min — ABNORMAL LOW (ref >60)
GFR calc non Af Amer: 35 mL/min — ABNORMAL LOW (ref >60)
Glucose, Bld: 114 mg/dL — ABNORMAL HIGH (ref 70–99)
Potassium: 3.2 mmol/L — ABNORMAL LOW (ref 3.5–5.1)
Sodium: 137 mmol/L (ref 135–145)
Total Bilirubin: 0.7 mg/dL (ref 0.3–1.2)
Total Protein: 6.8 g/dL (ref 6.5–8.1)

## 2019-12-23 LAB — CBC
HCT: 25.2 % — ABNORMAL LOW (ref 36.0–46.0)
Hemoglobin: 7.3 g/dL — ABNORMAL LOW (ref 12.0–15.0)
MCH: 16.9 pg — ABNORMAL LOW (ref 26.0–34.0)
MCHC: 29 g/dL — ABNORMAL LOW (ref 30.0–36.0)
MCV: 58.3 fL — ABNORMAL LOW (ref 80.0–100.0)
Platelets: 220 10*3/uL (ref 150–400)
RBC: 4.32 MIL/uL (ref 3.87–5.11)
RDW: 22.6 % — ABNORMAL HIGH (ref 11.5–15.5)
WBC: 8.2 10*3/uL (ref 4.0–10.5)
nRBC: 0.2 % (ref 0.0–0.2)

## 2019-12-23 LAB — URINALYSIS, ROUTINE W REFLEX MICROSCOPIC
Bilirubin Urine: NEGATIVE
Glucose, UA: NEGATIVE mg/dL
Ketones, ur: 5 mg/dL — AB
Nitrite: NEGATIVE
Protein, ur: NEGATIVE mg/dL
Specific Gravity, Urine: 1.016 (ref 1.005–1.030)
WBC, UA: >50 WBC/hpf — ABNORMAL HIGH (ref 0–5)
pH: 5 (ref 5.0–8.0)

## 2019-12-23 LAB — SARS CORONAVIRUS 2 BY RT PCR (HOSPITAL ORDER, PERFORMED IN ~~LOC~~ HOSPITAL LAB): SARS Coronavirus 2: NEGATIVE

## 2019-12-23 LAB — TROPONIN I (HIGH SENSITIVITY)
Troponin I (High Sensitivity): 29 ng/L — ABNORMAL HIGH (ref <18)
Troponin I (High Sensitivity): 31 ng/L — ABNORMAL HIGH (ref <18)

## 2019-12-23 LAB — PHOSPHORUS: Phosphorus: 3.3 mg/dL (ref 2.5–4.6)

## 2019-12-23 LAB — MAGNESIUM: Magnesium: 1.5 mg/dL — ABNORMAL LOW (ref 1.7–2.4)

## 2019-12-23 LAB — D-DIMER, QUANTITATIVE: D-Dimer, Quant: 0.86 ug/mL-FEU — ABNORMAL HIGH (ref 0.00–0.50)

## 2019-12-23 MED ORDER — SODIUM CHLORIDE 0.9 % IV BOLUS
1000.0000 mL | Freq: Once | INTRAVENOUS | Status: AC
  Administered 2019-12-23: 1000 mL via INTRAVENOUS

## 2019-12-23 MED ORDER — MAGNESIUM SULFATE 2 GM/50ML IV SOLN
2.0000 g | Freq: Once | INTRAVENOUS | Status: AC
  Administered 2019-12-23: 2 g via INTRAVENOUS
  Filled 2019-12-23: qty 50

## 2019-12-23 MED ORDER — POTASSIUM CHLORIDE CRYS ER 20 MEQ PO TBCR
20.0000 meq | EXTENDED_RELEASE_TABLET | Freq: Once | ORAL | Status: AC
  Administered 2019-12-23: 20 meq via ORAL
  Filled 2019-12-23: qty 1

## 2019-12-23 MED ORDER — ACETAMINOPHEN 325 MG PO TABS
650.0000 mg | ORAL_TABLET | Freq: Four times a day (QID) | ORAL | Status: DC | PRN
  Administered 2019-12-25 – 2019-12-27 (×2): 650 mg via ORAL
  Filled 2019-12-23 (×2): qty 2

## 2019-12-23 MED ORDER — IOHEXOL 350 MG/ML SOLN
60.0000 mL | Freq: Once | INTRAVENOUS | Status: AC | PRN
  Administered 2019-12-23: 60 mL via INTRAVENOUS

## 2019-12-23 MED ORDER — POTASSIUM CHLORIDE IN NACL 40-0.9 MEQ/L-% IV SOLN
INTRAVENOUS | Status: AC
  Filled 2019-12-23 (×2): qty 1000

## 2019-12-23 MED ORDER — ENSURE ENLIVE PO LIQD
237.0000 mL | Freq: Two times a day (BID) | ORAL | Status: DC
  Administered 2019-12-24 – 2019-12-27 (×7): 237 mL via ORAL

## 2019-12-23 MED ORDER — ONDANSETRON HCL 4 MG PO TABS: 4.0000 mg | ORAL_TABLET | Freq: Four times a day (QID) | ORAL | Status: DC | PRN

## 2019-12-23 MED ORDER — ONDANSETRON HCL 4 MG/2ML IJ SOLN: 4.0000 mg | Freq: Four times a day (QID) | INTRAMUSCULAR | Status: DC | PRN

## 2019-12-23 MED ORDER — ACETAMINOPHEN 650 MG RE SUPP
650.0000 mg | Freq: Four times a day (QID) | RECTAL | Status: DC | PRN
  Filled 2019-12-23: qty 1

## 2019-12-23 NOTE — ED Notes (Signed)
Hospitalist at bedside 

## 2019-12-23 NOTE — ED Provider Notes (Signed)
[facility] Emergency Department Provider Note MRN:  [id number]  Arrival date & time: 12/23/19     Chief Complaint   Shortness of Breath   History of Present Illness   Traci Parker is a 74 y.o. year-old female with a history of diabetes, hypertension presenting to the ED with chief complaint of shortness of breath.  1 or 2 weeks of persistent and worsening shortness of breath, worse when walking, but is also present when sitting still.  Recently discharged from the hospital.  Has also been having weight loss and poor appetite for several weeks.  Denies fever or cough, no chest pain, no abdominal pain, no numbness or weakness, no headache.  Has had 2 falls at home since being discharged but denies head trauma, no significant injury.  Review of Systems  A complete 10 system review of systems was obtained and all systems are negative except as noted in the HPI and PMH.   Patient's Health History    Past Medical History:  Diagnosis Date  . Allergy   . Anemia    pernicious anemia  . Asthma   . Blood transfusion without reported diagnosis   . COPD (chronic obstructive pulmonary disease) ([place]) 12/23/2019  . Glaucoma   . History of blood in urine   . Hypertension   . Osteoporosis   . Type 2 diabetes mellitus ([place]) 12/23/2019    Past Surgical History:  Procedure Laterality Date  . CHOLECYSTECTOMY  2008  . COLONOSCOPY  04/30/2011   Procedure: COLONOSCOPY;  Surgeon: Rogene Houston, MD;  [facility];  Service: Endoscopy;  Laterality: N/A;  . COLONOSCOPY  05/25/2012   Procedure: COLONOSCOPY;  Surgeon: Rogene Houston, MD;  [facility];  Service: Endoscopy;  Laterality: N/A;  1:25-changed to 1200 Ann to notify pt  . COLONOSCOPY Bilateral 12/2017  . ESOPHAGOGASTRODUODENOSCOPY N/A 12/07/2019   Procedure: ESOPHAGOGASTRODUODENOSCOPY (EGD);  Surgeon: Rogene Houston, MD;  Location: [facility];  Service: Endoscopy;  Laterality: N/A;  155  .  GALLBLADDER SURGERY    . right breast cystectomy  1988  . TUBAL LIGATION  1975    Family History  Problem Relation Age of Onset  . Diabetes Mother   . Hyperlipidemia Mother   . Hypertension Mother   . Heart disease Father   . Hyperlipidemia Father   . Diabetes Sister   . Hyperlipidemia Sister   . Hypertension Sister   . Cancer Brother   . Heart disease Brother   . Hyperlipidemia Brother   . Hypertension Brother   . Diabetes Sister     Social History   Socioeconomic History  . Marital status: Divorced    Spouse name: Not on file  . Number of children: 4  . Years of education: Not on file  . Highest education level: Not on file  Occupational History  . Not on file  Tobacco Use  . Smoking status: Former Smoker    Packs/day: 1.00    Years: 40.00    Pack years: 40.00    Quit date: 04/03/2011    Years since quitting: 8.7  . Smokeless tobacco: Never Used  Vaping Use  . Vaping Use: Never assessed  Substance and Sexual Activity  . Alcohol use: No  . Drug use: No  . Sexual activity: Not Currently  Other Topics Concern  . Not on file  Social History Narrative  . Not on file   Social Determinants of Health   Financial Resource  Strain:   . Difficulty of Paying Living Expenses:   Food Insecurity:   . Worried About Charity fundraiser in the Last Year:   . Arboriculturist in the Last Year:   Transportation Needs:   . Film/video editor (Medical):   Marland Kitchen Lack of Transportation (Non-Medical):   Physical Activity:   . Days of Exercise per Week:   . Minutes of Exercise per Session:   Stress:   . Feeling of Stress :   Social Connections:   . Frequency of Communication with Friends and Family:   . Frequency of Social Gatherings with Friends and Family:   . Attends Religious Services:   . Active Member of Clubs or Organizations:   . Attends Archivist Meetings:   Marland Kitchen Marital Status:   Intimate Partner Violence:   . Fear of Current or Ex-Partner:   .  Emotionally Abused:   Marland Kitchen Physically Abused:   . Sexually Abused:      Physical Exam   Vitals:   12/23/19 2130 12/23/19 2200  BP: 131/81 131/86  Pulse: (!) 106 (!) 105  Resp: 20 18  Temp:  98.5 F (36.9 C)  SpO2: 95% 100%    CONSTITUTIONAL: Well-appearing, NAD NEURO:  Alert and oriented x 3, no focal deficits EYES:  eyes equal and reactive ENT/NECK:  no LAD, no JVD CARDIO: Tachycardic rate, well-perfused, normal S1 and S2 PULM:  CTAB no wheezing or rhonchi GI/GU:  normal bowel sounds, non-distended, non-tender MSK/SPINE:  No gross deformities, no edema SKIN:  no rash, atraumatic PSYCH:  Appropriate speech and behavior  *Additional and/or pertinent findings included in MDM below  Diagnostic and Interventional Summary    EKG Interpretation  Date/Time:    Ventricular Rate:    PR Interval:    QRS Duration:   QT Interval:    QTC Calculation:   R Axis:     Text Interpretation:        Labs Reviewed  D-DIMER, QUANTITATIVE (NOT AT [facility]) - Abnormal; Notable for the following components:      Result Value   D-Dimer, Quant 0.86 (*)    All other components within normal limits  CBC - Abnormal; Notable for the following components:   Hemoglobin 7.3 (*)    HCT 25.2 (*)    MCV 58.3 (*)    MCH 16.9 (*)    MCHC 29.0 (*)    RDW 22.6 (*)    All other components within normal limits  COMPREHENSIVE METABOLIC PANEL - Abnormal; Notable for the following components:   Potassium 3.2 (*)    CO2 21 (*)    Glucose, Bld 114 (*)    Creatinine, Ser 1.46 (*)    AST 13 (*)    GFR calc non Af Amer 35 (*)    GFR calc Af Amer 40 (*)    All other components within normal limits  URINALYSIS, ROUTINE W REFLEX MICROSCOPIC - Abnormal; Notable for the following components:   APPearance CLOUDY (*)    Hgb urine dipstick MODERATE (*)    Ketones, ur 5 (*)    Leukocytes,Ua LARGE (*)    WBC, UA >50 (*)    Bacteria, UA MANY (*)    All other components within normal limits  MAGNESIUM - Abnormal;  Notable for the following components:   Magnesium 1.5 (*)    All other components within normal limits  TROPONIN I (HIGH SENSITIVITY) - Abnormal; Notable for the following components:   Troponin I (  High Sensitivity) 29 (*)    All other components within normal limits  TROPONIN I (HIGH SENSITIVITY) - Abnormal; Notable for the following components:   Troponin I (High Sensitivity) 31 (*)    All other components within normal limits  SARS CORONAVIRUS 2 BY RT PCR (HOSPITAL ORDER, [place] [facility])  URINE CULTURE  PHOSPHORUS  BASIC METABOLIC PANEL  CBC  TYPE AND SCREEN    CT ANGIO CHEST PE W OR WO CONTRAST  Final Result    DG Chest Port 1 View  Final Result      Medications  acetaminophen (TYLENOL) tablet 650 mg (has no administration in time range)    Or  acetaminophen (TYLENOL) suppository 650 mg (has no administration in time range)  ondansetron (ZOFRAN) tablet 4 mg (has no administration in time range)    Or  ondansetron (ZOFRAN) injection 4 mg (has no administration in time range)  0.9 % NaCl with KCl 40 mEq / L  infusion ( Intravenous New Bag/Given 12/23/19 2212)  feeding supplement (ENSURE ENLIVE) (ENSURE ENLIVE) liquid 237 mL (has no administration in time range)  sodium chloride 0.9 % bolus 1,000 mL (0 mLs Intravenous Stopped 12/23/19 1906)  iohexol (OMNIPAQUE) 350 MG/ML injection 60 mL (60 mLs Intravenous Contrast Given 12/23/19 1812)  potassium chloride SA (KLOR-CON) CR tablet 20 mEq (20 mEq Oral Given 12/23/19 2056)  magnesium sulfate IVPB 2 g 50 mL (2 g Intravenous Transfusing/Transfer 12/23/19 2137)     Procedures  /  Critical Care Procedures  ED Course and Medical Decision Making  I have reviewed the triage vital signs, the nursing notes, and pertinent available records from the EMR.  Listed above are laboratory and imaging tests that I personally ordered, reviewed, and interpreted and then considered in my medical decision making (see below for  details).      Patient arrives tachycardic, normotensive, afebrile, well-appearing in no acute distress, feels short of breath at rest, feels more short of breath when ambulating.  Recent hospitalization.  Thought to be low risk but will use D-dimer to screen for PE.  No signs of DVT on exam.  Patient has had issues with p.o. intake over the past several weeks, CT of the abdomen on last admission demonstrating gastritis but no other issues.  Tachycardia could be explained by dehydration.  Providing fluids.  D-dimer is positive, CTA is negative.  Patient continues to be tachycardic between 110 and 115, hemoglobin is 7.3, where she has been recently but I do suspect a degree of symptomatic anemia.  She lives alone and she is functionally declining, she no longer is able to walk on her own, she has had 2 falls since her discharge.  Admitted to hospital service for further care.  Barth Kirks. [place]onia Small, [place] [email or web address]  Final Clinical Impressions(s) / ED Diagnoses     ICD-10-CM   1. Symptomatic anemia  D64.9   2. Dehydration  E86.0     ED Discharge Orders    None       Discharge Instructions Discussed with and Provided to Patient:   Discharge Instructions   None       Maudie Flakes, MD 12/23/19 2313

## 2019-12-23 NOTE — H&P (Signed)
History and Physical    Traci Parker TGG:[id number] DOB: 25-Aug-1945 DOA: 12/23/2019  PCP: Doree Albee, MD   Patient coming from: Home.  I have personally briefly reviewed patient's old medical records in [place]  Chief Complaint: Weakness and shortness of breath.  HPI: Traci Parker is a 74 y.o. female with medical history significant of seasonal allergies, pernicious anemia, asthma/COPD, history of blood transfusion, glaucoma, history of hematuria, hypertension, osteoporosis, type 2 diabetes who is coming back to the hospital after being discharged 2 days ago due to generalized weakness and having falls at home.  Her son and other family members are concerned because she lives at home alone.  She denies fever, but complains of chills and fatigue.  No rhinorrhea, sore throat, wheezing or hemoptysis, but has been feeling short of breath on exertion.  She denies chest pain, palpitations, dizziness, diaphoresis, PND, orthopnea or recent pitting edema of the lower extremities.  She denies abdominal pain, diarrhea, constipation, melena or hematochezia.  No dysuria, frequency or materia.  No polyuria, polydipsia, polyphagia or blurred vision.  ED Course: Initial vital signs were temperature 98.4 F, pulse 116, RR 18, 135/75 mmHg and 96%. The patient received a 1000 mL NS bolus in the emergency department. She received oral potassium and 2 g of magnesium sulfate IVPB.  Urinalysis had a cloudy appearance with moderate hemoglobinuria, ketonuria 5 mg/dL and large leukocyte esterase.  Microscopic urine exam shows 21-50 RBCs, WBC more than 50 and many bacteria on examination.  White count was 8.2, hemoglobin 7.3 g/dL and platelets 228.  D-dimer is 0.86.  Troponin was 29 and then 31 ng/L.  CMP shows a potassium of 3.2 and CO2 of 21 mmol/L.  All other electrolytes are within normal limits.  LFTs are unremarkable.  Magnesium is 1.5, phosphorus 3.3, calcium 9.4, glucose 114, BUN 15 and creatinine 1.46  mg/dL.  Review of Systems: As per HPI otherwise all other systems reviewed and are negative.  Past Medical History:  Diagnosis Date  . Allergy   . Anemia    pernicious anemia  . Asthma   . Blood transfusion without reported diagnosis   . COPD (chronic obstructive pulmonary disease) ([place]) 12/23/2019  . Glaucoma   . History of blood in urine   . Hypertension   . Osteoporosis   . Type 2 diabetes mellitus ([place]) 12/23/2019   Past Surgical History:  Procedure Laterality Date  . CHOLECYSTECTOMY  2008  . COLONOSCOPY  04/30/2011   Procedure: COLONOSCOPY;  Surgeon: Rogene Houston, MD;  [facility];  Service: Endoscopy;  Laterality: N/A;  . COLONOSCOPY  05/25/2012   Procedure: COLONOSCOPY;  Surgeon: Rogene Houston, MD;  [facility];  Service: Endoscopy;  Laterality: N/A;  1:25-changed to 1200 Ann to notify pt  . COLONOSCOPY Bilateral 12/2017  . ESOPHAGOGASTRODUODENOSCOPY N/A 12/07/2019   Procedure: ESOPHAGOGASTRODUODENOSCOPY (EGD);  Surgeon: Rogene Houston, MD;  [facility];  Service: Endoscopy;  Laterality: N/A;  155  . GALLBLADDER SURGERY    . right breast cystectomy  1988  . TUBAL LIGATION  1975   Social History  reports that she quit smoking about 8 years ago. She has a 40.00 pack-year smoking history. She has never used smokeless tobacco. She reports that she does not drink alcohol and does not use drugs.  Allergies  Allergen Reactions  . Latex Rash  . Levofloxacin Nausea And Vomiting  . Neomycin-Bacitracin-Polymyxin  [Bacitracin-Neomycin-Polymyxin] Rash  . Other Rash and  Swelling  . Prednisone Hives, Itching, Rash and Other (See Comments)  . Fish Allergy     rash  . Neosporin [Neomycin-Polymyxin-Gramicidin] Itching and Rash   Family History  Problem Relation Age of Onset  . Diabetes Mother   . Hyperlipidemia Mother   . Hypertension Mother   . Heart disease Father   . Hyperlipidemia Father   . Diabetes Sister   . Hyperlipidemia  Sister   . Hypertension Sister   . Cancer Brother   . Heart disease Brother   . Hyperlipidemia Brother   . Hypertension Brother   . Diabetes Sister    Prior to Admission medications   Medication Sig Start Date End Date Taking? Authorizing Provider  albuterol (VENTOLIN HFA) 108 (90 Base) MCG/ACT inhaler Inhale 2 puffs into the lungs every 4 (four) hours as needed for wheezing or shortness of breath. 11/21/19  Yes Lockamy, Randi L, NP-C  amLODipine (NORVASC) 5 MG tablet Take 2 tablets (10 mg total) by mouth daily. Patient taking differently: Take 10 mg by mouth every morning.  09/20/19  Yes Corum, Rex Kras, MD  cyanocobalamin (,VITAMIN B-12,) 1000 MCG/ML injection Inject 1 mL (1,000 mcg total) into the muscle every 30 (thirty) days. 05/30/19  Yes Corum, Rex Kras, MD  dorzolamide (TRUSOPT) 2 % ophthalmic solution Place 1 drop into both eyes 2 (two) times daily.   Yes [provider]  epoetin alfa (EPOGEN) 3000 UNIT/ML injection Inject 3,000 Units into the vein every 14 (fourteen) days.   Yes Bhutani, Manpreet S, MD  folic acid (FOLVITE) 1 MG tablet Take 1 mg by mouth daily.  05/31/19  Yes [provider]  latanoprost (XALATAN) 0.005 % ophthalmic solution Place 1 drop into both eyes at bedtime.   Yes [provider]  metoCLOPramide (REGLAN) 5 MG tablet Take 1 tablet (5 mg total) by mouth 3 (three) times daily. 12/21/19 12/20/20 Yes Shah, Pratik D, DO  omeprazole (PRILOSEC) 40 MG capsule Take 40 mg by mouth daily.  10/30/19  Yes [provider]  ondansetron (ZOFRAN ODT) 4 MG disintegrating tablet Take 1 tablet (4 mg total) by mouth every 8 (eight) hours as needed for nausea or vomiting. Dissolve under tongue 11/21/19  Yes Lockamy, Randi L, NP-C  promethazine (PHENERGAN) 12.5 MG tablet Take 1 tablet (12.5 mg total) by mouth every 8 (eight) hours as needed for nausea or vomiting. 12/18/19  Yes Ailene Ards, NP   Physical Exam: Vitals:   12/23/19 2030 12/23/19 2100 12/23/19  2130 12/23/19 2200  BP: 131/86 (!) 152/93 131/81 131/86  Pulse: (!) 106 (!) 111 (!) 106 (!) 105  Resp: 16 20 20 18   Temp:    98.5 F (36.9 C)  TempSrc:    Oral  SpO2: 96% 96% 95% 100%  Weight:      Height:       Constitutional: NAD, calm, comfortable Eyes: PERRL, lids and conjunctivae normal ENMT: Mucous membranes are mildly dry. Posterior pharynx clear of any exudate or lesions. Neck: normal, supple, no masses, no thyromegaly Respiratory: clear to auscultation bilaterally, no wheezing, no crackles. Normal respiratory effort. No accessory muscle use.  Cardiovascular: Regular rate and rhythm, no murmurs / rubs / gallops. No extremity edema. 2+ pedal pulses. No carotid bruits.  Abdomen: No distension. BS positive, Soft, no tenderness, no masses palpated. No hepatosplenomegaly.  Musculoskeletal: Generalized weakness. no clubbing / cyanosis. Good ROM, no contractures. Normal muscle tone.  Skin: No acute rashes or lesions. Neurologic: CN 2-12 grossly intact. Sensation intact,  DTR normal. Generalized weakness.  Psychiatric: Normal judgment and insight. Alert and oriented x 3. Normal mood.   Labs on Admission: I have personally reviewed following labs and imaging studies  CBC: Recent Labs  Lab 12/18/19 1245 12/19/19 0444 12/20/19 0608 12/23/19 1603  WBC 8.8 8.1 7.6 8.2  NEUTROABS 7.5  --   --   --   HGB 8.3* 7.1* 7.3* 7.3*  HCT 28.3* 24.2* 24.2* 25.2*  MCV 59.0* 58.0* 58.7* 58.3*  PLT 277 239 243 324   Basic Metabolic Panel: Recent Labs  Lab 12/18/19 1245 12/19/19 0444 12/20/19 0608 12/23/19 1603 12/23/19 1835  NA 138 140 136 137  --   K 3.8 3.6 3.6 3.2*  --   CL 100 107 102 103  --   CO2 22 21* 20* 21*  --   GLUCOSE 120* 91 99 114*  --   BUN 25* 23 17 15   --   CREATININE 1.95* 1.42* 1.33* 1.46*  --   CALCIUM 10.0 9.1 9.4 9.4  --   MG  --   --   --   --  1.5*  PHOS  --   --   --   --  3.3   GFR: Estimated Creatinine Clearance: 26.7 mL/min (A) (by C-G formula based  on SCr of 1.46 mg/dL (H)).  Liver Function Tests: Recent Labs  Lab 12/18/19 1245 12/23/19 1603  AST 16 13*  ALT 12 11  ALKPHOS 68 54  BILITOT 1.0 0.7  PROT 7.8 6.8  ALBUMIN 4.3 4.1   Urine analysis:    Component Value Date/Time   COLORURINE YELLOW 12/23/2019 1857   APPEARANCEUR CLOUDY (A) 12/23/2019 1857   LABSPEC 1.016 12/23/2019 1857   PHURINE 5.0 12/23/2019 1857   GLUCOSEU NEGATIVE 12/23/2019 1857   HGBUR MODERATE (A) 12/23/2019 1857   BILIRUBINUR NEGATIVE 12/23/2019 1857   BILIRUBINUR negative 03/23/2019 1015   KETONESUR 5 (A) 12/23/2019 1857   PROTEINUR NEGATIVE 12/23/2019 1857   UROBILINOGEN 1.0 03/23/2019 1015   UROBILINOGEN >8.0 (H) 04/15/2007 2055   NITRITE NEGATIVE 12/23/2019 1857   LEUKOCYTESUR LARGE (A) 12/23/2019 1857   Radiological Exams on Admission: CT ANGIO CHEST PE W OR WO CONTRAST  Result Date: 12/23/2019 CLINICAL DATA:  Dyspnea on exertion, elevated D-dimer and shortness of breath for 2 weeks. EXAM: CT ANGIOGRAPHY CHEST WITH CONTRAST TECHNIQUE: Multidetector CT imaging of the chest was performed using the standard protocol during bolus administration of intravenous contrast. Multiplanar CT image reconstructions and MIPs were obtained to evaluate the vascular anatomy. CONTRAST:  59mL OMNIPAQUE IOHEXOL 350 MG/ML SOLN COMPARISON:  Abdomen and pelvis CT December 15, 2019 FINDINGS: Cardiovascular: Calcific and noncalcific atheromatous plaque in the thoracic aorta. Moderate and with 3.6 cm caliber of ascending thoracic aorta. Heart size is normal without pericardial effusion. Opacification of pulmonary vasculature without sign of pulmonary embolism. Mediastinum/Nodes: Thoracic inlet structures are normal. No axillary lymphadenopathy. No mediastinal lymphadenopathy. No hilar lymphadenopathy. Esophagus is mildly patulous. Lungs/Pleura: Paraseptal emphysema, mild and worse at the lung apices. No consolidation. No pleural effusion. Small nodule along the fissure in the RIGHT  lower lobe (image 78, series 8) 3 mm. No consolidation. No pleural effusion. Upper Abdomen: Incidental imaging of upper abdominal contents without acute upper abdominal process. Limited visualization of viscera in the upper abdomen. Musculoskeletal: No acute musculoskeletal process. Review of the MIP images confirms the above findings. IMPRESSION: 1. No evidence of pulmonary embolism. 2. Paraseptal emphysema, mild and worse at the lung apices. 3. 3 mm nodule  along the fissure in the RIGHT lower lobe. No follow-up needed if patient is low-risk. Non-contrast chest CT can be considered in 12 months if patient is high-risk. This recommendation follows the consensus statement: Guidelines for Management of Incidental Pulmonary Nodules Detected on CT Images: From the Fleischner Society 2017; Radiology 2017; 284:228-243. 4. Moderate and with 3.6 cm caliber of ascending thoracic aorta. Recommend annual imaging followup by CTA or MRA. This recommendation follows 2010 ACCF/AHA/AATS/ACR/ASA/SCA/SCAI/SIR/STS/SVM Guidelines for the Diagnosis and Management of Patients with Thoracic Aortic Disease. Circulation.2010; 121: F573-U202. Aortic aneurysm NOS (ICD10-I71.9) 5. Emphysema and aortic atherosclerosis. Aortic Atherosclerosis (ICD10-I70.0) and Emphysema (ICD10-J43.9). Electronically Signed   By: Zetta Bills M.D.   On: 12/23/2019 18:47   DG Chest Port 1 View  Result Date: 12/23/2019 CLINICAL DATA:  Shortness of breath EXAM: PORTABLE CHEST 1 VIEW COMPARISON:  12/18/2019 FINDINGS: The heart size and mediastinal contours are within normal limits. Both lungs are clear. The visualized skeletal structures are unremarkable. IMPRESSION: No active disease. Electronically Signed   By: Inez Catalina M.D.   On: 12/23/2019 16:23   EKG: Independently reviewed.  Vent. rate 115 BPM PR interval * ms QRS duration 119 ms QT/QTc 433/599 ms P-R-T axes * 25 71 Junctional tachycardia Nonspecific intraventricular conduction  delay Anteroseptal infarct, age indeterminate  Assessment/Plan Principal Problem:   Failure to thrive in adult Lives alone at home. Recent multiple falls. Observation/Telemetry. Fall precautions. Consult PT/OT. Consult TOC team. Consult nutritional services.  Active Problems:   Fall at home Consult physical therapy. Advised to consider temporary SNF. Consult TOC team.     Microcytic anemia Worsening weakness factor.  Monitor H&H. Transfuse as needed.    Lung nodule < 6cm on CT Will need future surveillance Follow up with PCP.    Hyperlipidemia Not on medical therapy. Life style modifications. Follow with PCP.    Open-angle glaucoma Continue Xalatan drops. Continue Trusopt drops.    Hypertension Continue amlodipine 10 mg p.o. daily. Monitor blood pressure.    COPD (chronic obstructive pulmonary disease) ([place]) Supplemental oxygen as needed. Bronchodilators as needed.    Type 2 diabetes mellitus (HCC) Carbohydrate modified diet.    Hypokalemia Replacing Follow-up potassium level Continue daily potassium supplement    Hypomagnesemia Replaced. Follow-up magnesium level.   DVT prophylaxis: SCDs. Code Status:   Full code. Family Communication:   Disposition Plan:   Patient is from:  Home.  Anticipated DC to:  Home.  Anticipated DC date:  12/24/2019.  Anticipated DC barriers: Clinical improvement. Consults called:  PT and TOC. Admission status:  Observation/Telemetry.  Severity of Illness:  Reubin Milan MD [facility]  How to contact the [facility] Attending or Consulting provider [place] or covering provider during after hours [place], for this patient?   1. Check the care team in [facility] and look for a) attending/consulting [facility] provider listed and b) the [facility] team listed 2. Log into [email or web address] and use [place] universal password to access. If you do not have the password, please contact the hospital operator. 3. Locate the [facility] provider  you are looking for under [facility] and page to a number that you can be directly reached. 4. If you still have difficulty reaching the provider, please page the [facility] (Director on Call) for the Hospitalists listed on amion for assistance.  12/23/2019, 10:16 PM   This document was prepared using Dragon voice recognition software and may contain some unintended transcription errors.

## 2019-12-23 NOTE — ED Triage Notes (Signed)
Pt brought in by EMS due to shortness of breath for 1-2 weeks. Pt reports being discharged from hospital on Thursday. She did not want to go to a SNF. Pt lives alone and per EMS pt could hardly walk. Pt conts to complain of tingly of arms and legs. Sats 99% on RA. BP 135/75

## 2019-12-23 NOTE — ED Notes (Signed)
ED Provider at bedside. 

## 2019-12-24 ENCOUNTER — Other Ambulatory Visit: Payer: Self-pay

## 2019-12-24 DIAGNOSIS — Z9181 History of falling: Secondary | ICD-10-CM | POA: Diagnosis not present

## 2019-12-24 DIAGNOSIS — R279 Unspecified lack of coordination: Secondary | ICD-10-CM | POA: Diagnosis not present

## 2019-12-24 DIAGNOSIS — E876 Hypokalemia: Secondary | ICD-10-CM

## 2019-12-24 DIAGNOSIS — R911 Solitary pulmonary nodule: Secondary | ICD-10-CM

## 2019-12-24 DIAGNOSIS — IMO0001 Reserved for inherently not codable concepts without codable children: Secondary | ICD-10-CM

## 2019-12-24 DIAGNOSIS — E860 Dehydration: Secondary | ICD-10-CM

## 2019-12-24 DIAGNOSIS — D649 Anemia, unspecified: Secondary | ICD-10-CM | POA: Diagnosis not present

## 2019-12-24 DIAGNOSIS — H4010X0 Unspecified open-angle glaucoma, stage unspecified: Secondary | ICD-10-CM | POA: Diagnosis not present

## 2019-12-24 DIAGNOSIS — R627 Adult failure to thrive: Secondary | ICD-10-CM | POA: Diagnosis not present

## 2019-12-24 DIAGNOSIS — J449 Chronic obstructive pulmonary disease, unspecified: Secondary | ICD-10-CM

## 2019-12-24 DIAGNOSIS — T7849XS Other allergy, sequela: Secondary | ICD-10-CM | POA: Diagnosis not present

## 2019-12-24 DIAGNOSIS — K3184 Gastroparesis: Secondary | ICD-10-CM | POA: Diagnosis present

## 2019-12-24 DIAGNOSIS — R1310 Dysphagia, unspecified: Secondary | ICD-10-CM | POA: Diagnosis not present

## 2019-12-24 DIAGNOSIS — R112 Nausea with vomiting, unspecified: Secondary | ICD-10-CM | POA: Diagnosis not present

## 2019-12-24 DIAGNOSIS — E785 Hyperlipidemia, unspecified: Secondary | ICD-10-CM

## 2019-12-24 DIAGNOSIS — D563 Thalassemia minor: Secondary | ICD-10-CM | POA: Diagnosis present

## 2019-12-24 DIAGNOSIS — E440 Moderate protein-calorie malnutrition: Secondary | ICD-10-CM | POA: Diagnosis present

## 2019-12-24 DIAGNOSIS — M6281 Muscle weakness (generalized): Secondary | ICD-10-CM | POA: Diagnosis not present

## 2019-12-24 DIAGNOSIS — Z20822 Contact with and (suspected) exposure to covid-19: Secondary | ICD-10-CM | POA: Diagnosis present

## 2019-12-24 DIAGNOSIS — B9620 Unspecified Escherichia coli [E. coli] as the cause of diseases classified elsewhere: Secondary | ICD-10-CM | POA: Diagnosis not present

## 2019-12-24 DIAGNOSIS — R531 Weakness: Secondary | ICD-10-CM | POA: Diagnosis present

## 2019-12-24 DIAGNOSIS — I10 Essential (primary) hypertension: Secondary | ICD-10-CM

## 2019-12-24 DIAGNOSIS — I129 Hypertensive chronic kidney disease with stage 1 through stage 4 chronic kidney disease, or unspecified chronic kidney disease: Secondary | ICD-10-CM | POA: Diagnosis present

## 2019-12-24 DIAGNOSIS — E1143 Type 2 diabetes mellitus with diabetic autonomic (poly)neuropathy: Secondary | ICD-10-CM | POA: Diagnosis present

## 2019-12-24 DIAGNOSIS — R824 Acetonuria: Secondary | ICD-10-CM | POA: Diagnosis present

## 2019-12-24 DIAGNOSIS — N1831 Chronic kidney disease, stage 3a: Secondary | ICD-10-CM | POA: Diagnosis present

## 2019-12-24 DIAGNOSIS — D510 Vitamin B12 deficiency anemia due to intrinsic factor deficiency: Secondary | ICD-10-CM | POA: Diagnosis not present

## 2019-12-24 DIAGNOSIS — R823 Hemoglobinuria: Secondary | ICD-10-CM | POA: Diagnosis present

## 2019-12-24 DIAGNOSIS — R296 Repeated falls: Secondary | ICD-10-CM | POA: Diagnosis present

## 2019-12-24 DIAGNOSIS — D631 Anemia in chronic kidney disease: Secondary | ICD-10-CM | POA: Diagnosis not present

## 2019-12-24 DIAGNOSIS — R634 Abnormal weight loss: Secondary | ICD-10-CM | POA: Diagnosis present

## 2019-12-24 DIAGNOSIS — Z681 Body mass index (BMI) 19 or less, adult: Secondary | ICD-10-CM | POA: Diagnosis not present

## 2019-12-24 DIAGNOSIS — K2970 Gastritis, unspecified, without bleeding: Secondary | ICD-10-CM | POA: Diagnosis not present

## 2019-12-24 DIAGNOSIS — N390 Urinary tract infection, site not specified: Secondary | ICD-10-CM | POA: Diagnosis not present

## 2019-12-24 DIAGNOSIS — M810 Age-related osteoporosis without current pathological fracture: Secondary | ICD-10-CM | POA: Diagnosis not present

## 2019-12-24 DIAGNOSIS — Z9049 Acquired absence of other specified parts of digestive tract: Secondary | ICD-10-CM | POA: Diagnosis not present

## 2019-12-24 DIAGNOSIS — E1122 Type 2 diabetes mellitus with diabetic chronic kidney disease: Secondary | ICD-10-CM | POA: Diagnosis present

## 2019-12-24 DIAGNOSIS — D509 Iron deficiency anemia, unspecified: Secondary | ICD-10-CM | POA: Diagnosis not present

## 2019-12-24 DIAGNOSIS — R262 Difficulty in walking, not elsewhere classified: Secondary | ICD-10-CM | POA: Diagnosis not present

## 2019-12-24 DIAGNOSIS — E1129 Type 2 diabetes mellitus with other diabetic kidney complication: Secondary | ICD-10-CM | POA: Diagnosis not present

## 2019-12-24 LAB — GLUCOSE, CAPILLARY
Glucose-Capillary: 94 mg/dL (ref 70–99)
Glucose-Capillary: 103 mg/dL — ABNORMAL HIGH (ref 70–99)
Glucose-Capillary: 113 mg/dL — ABNORMAL HIGH (ref 70–99)
Glucose-Capillary: 102 mg/dL — ABNORMAL HIGH (ref 70–99)

## 2019-12-24 LAB — BASIC METABOLIC PANEL
Anion gap: 10 (ref 5–15)
BUN: 10 mg/dL (ref 8–23)
CO2: 21 mmol/L — ABNORMAL LOW (ref 22–32)
Calcium: 8.7 mg/dL — ABNORMAL LOW (ref 8.9–10.3)
Chloride: 107 mmol/L (ref 98–111)
Creatinine, Ser: 1.18 mg/dL — ABNORMAL HIGH (ref 0.44–1.00)
GFR calc Af Amer: 52 mL/min — ABNORMAL LOW (ref >60)
GFR calc non Af Amer: 45 mL/min — ABNORMAL LOW (ref >60)
Glucose, Bld: 90 mg/dL (ref 70–99)
Potassium: 4.2 mmol/L (ref 3.5–5.1)
Sodium: 138 mmol/L (ref 135–145)

## 2019-12-24 LAB — CBC
HCT: 24.3 % — ABNORMAL LOW (ref 36.0–46.0)
Hemoglobin: 7 g/dL — ABNORMAL LOW (ref 12.0–15.0)
MCH: 16.8 pg — ABNORMAL LOW (ref 26.0–34.0)
MCHC: 28.8 g/dL — ABNORMAL LOW (ref 30.0–36.0)
MCV: 58.3 fL — ABNORMAL LOW (ref 80.0–100.0)
Platelets: 202 10*3/uL (ref 150–400)
RBC: 4.17 MIL/uL (ref 3.87–5.11)
RDW: 22.9 % — ABNORMAL HIGH (ref 11.5–15.5)
WBC: 6.5 10*3/uL (ref 4.0–10.5)
nRBC: 0 % (ref 0.0–0.2)

## 2019-12-24 MED ORDER — AMLODIPINE BESYLATE 5 MG PO TABS
10.0000 mg | ORAL_TABLET | Freq: Every morning | ORAL | Status: DC
  Administered 2019-12-24 – 2019-12-27 (×4): 10 mg via ORAL
  Filled 2019-12-24 (×4): qty 2

## 2019-12-24 MED ORDER — LATANOPROST 0.005 % OP SOLN
1.0000 [drp] | Freq: Every day | OPHTHALMIC | Status: DC
  Administered 2019-12-24 – 2019-12-26 (×3): 1 [drp] via OPHTHALMIC
  Filled 2019-12-24 (×2): qty 2.5

## 2019-12-24 MED ORDER — PROMETHAZINE HCL 12.5 MG PO TABS: 12.5000 mg | ORAL_TABLET | Freq: Three times a day (TID) | ORAL | Status: DC | PRN

## 2019-12-24 MED ORDER — METOCLOPRAMIDE HCL 10 MG PO TABS
5.0000 mg | ORAL_TABLET | Freq: Three times a day (TID) | ORAL | Status: DC
  Administered 2019-12-24 – 2019-12-27 (×9): 5 mg via ORAL
  Filled 2019-12-24 (×10): qty 1

## 2019-12-24 MED ORDER — ALBUTEROL SULFATE (2.5 MG/3ML) 0.083% IN NEBU: 2.5000 mg | INHALATION_SOLUTION | RESPIRATORY_TRACT | Status: DC | PRN

## 2019-12-24 MED ORDER — PANTOPRAZOLE SODIUM 40 MG PO TBEC
40.0000 mg | DELAYED_RELEASE_TABLET | Freq: Every day | ORAL | Status: DC
  Administered 2019-12-24 – 2019-12-27 (×4): 40 mg via ORAL
  Filled 2019-12-24 (×4): qty 1

## 2019-12-24 MED ORDER — DORZOLAMIDE HCL 2 % OP SOLN
1.0000 [drp] | Freq: Two times a day (BID) | OPHTHALMIC | Status: DC
  Administered 2019-12-24 – 2019-12-27 (×7): 1 [drp] via OPHTHALMIC
  Filled 2019-12-24: qty 10

## 2019-12-24 MED ORDER — ALBUTEROL SULFATE HFA 108 (90 BASE) MCG/ACT IN AERS: 2.0000 | INHALATION_SPRAY | RESPIRATORY_TRACT | Status: DC | PRN

## 2019-12-24 NOTE — NC FL2 (Signed)
[place] LEVEL OF CARE SCREENING TOOL     IDENTIFICATION  Patient Name: Traci Parker Birthdate: 1946/02/09 Sex: female Admission Date (Current Location): 12/23/2019  [facility] and [place] Number:  [employer] and Address:  [facility] [address], [place]      Provider Number: [id number]  Attending Physician Name and Address:  Kathie Dike, MD  Relative Name and Phone Number:       Current Level of Care: Hospital Recommended Level of Care: [place] Prior Approval Number:    Date Approved/Denied:   PASRR Number: [id number]  Discharge Plan: SNF    Current Diagnoses: Patient Active Problem List   Diagnosis Date Noted  . Lung nodule < 6cm on CT 12/24/2019  . Failure to thrive in adult 12/23/2019  . COPD (chronic obstructive pulmonary disease) ([place]) 12/23/2019  . Type 2 diabetes mellitus ([place]) 12/23/2019  . Hypokalemia 12/23/2019  . Hypomagnesemia 12/23/2019  . Fall 12/23/2019  . Intractable nausea and vomiting 12/19/2019  . Refractory nausea and vomiting 12/18/2019  . Volume depletion 12/18/2019  . Weight loss 12/18/2019  . Gastritis by recent EGD 12/18/2019  . Dehydration 12/13/2019  . Nausea and vomiting 10/30/2019  . Fatigue 10/30/2019  . Osteoporosis 09/20/2019  . Hypertension 09/20/2019  . Microcytic anemia 05/08/2019  . Screening for breast cancer 03/23/2019  . Hyperlipidemia 03/23/2019  . Pre-diabetes 03/23/2019  . Open-angle glaucoma 03/23/2019    Orientation RESPIRATION BLADDER Height & Weight     Self, Time, Situation, Place  Normal Continent Weight: 111 lb 15.9 oz (50.8 kg) Height:  5\' 4"  (162.6 cm)  BEHAVIORAL SYMPTOMS/MOOD NEUROLOGICAL BOWEL NUTRITION STATUS      Continent Diet (see dc summary)  AMBULATORY STATUS COMMUNICATION OF NEEDS Skin   Extensive Assist Verbally Normal                       Personal Care Assistance Level of Assistance  Bathing, Feeding,  Dressing Bathing Assistance: Limited assistance Feeding assistance: Independent Dressing Assistance: Limited assistance     Functional Limitations Info  Sight, Hearing, Speech Sight Info: Adequate Hearing Info: Adequate Speech Info: Adequate    SPECIAL CARE FACTORS FREQUENCY  PT (By licensed PT), OT (By licensed OT)     PT Frequency: 5x weekly OT Frequency: 3x weekly            Contractures      Additional Factors Info  Code Status, Allergies Code Status Info: Full Allergies Info: Latex, Levofloxacin, Neomycin-bacitracin-polymyxin, Prednisone, Fish, Neosporin           Current Medications (12/24/2019):  This is the current hospital active medication list Current Facility-Administered Medications  Medication Dose Route Frequency Provider Last Rate Last Admin  . acetaminophen (TYLENOL) tablet 650 mg  650 mg Oral Q6H PRN Reubin Milan, MD       Or  . acetaminophen (TYLENOL) suppository 650 mg  650 mg Rectal Q6H PRN Reubin Milan, MD      . albuterol (PROVENTIL) (2.5 MG/3ML) 0.083% nebulizer solution 2.5 mg  2.5 mg Nebulization Q4H PRN Reubin Milan, MD      . amLODipine (NORVASC) tablet 10 mg  10 mg Oral q morning - 10a Reubin Milan, MD   10 mg at 12/24/19 1035  . dorzolamide (TRUSOPT) 2 % ophthalmic solution 1 drop  1 drop Both Eyes BID Reubin Milan, MD   1 drop at 12/24/19 1035  .  feeding supplement (ENSURE ENLIVE) (ENSURE ENLIVE) liquid 237 mL  237 mL Oral BID BM Reubin Milan, MD   237 mL at 12/24/19 1553  . latanoprost (XALATAN) 0.005 % ophthalmic solution 1 drop  1 drop Both Eyes QHS Reubin Milan, MD      . metoCLOPramide [facility]) tablet 5 mg  5 mg Oral TID Reubin Milan, MD   5 mg at 12/24/19 1553  . ondansetron (ZOFRAN) tablet 4 mg  4 mg Oral Q6H PRN Reubin Milan, MD       Or  . ondansetron [facility]) injection 4 mg  4 mg Intravenous Q6H PRN Reubin Milan, MD      . pantoprazole (PROTONIX) EC tablet 40  mg  40 mg Oral Daily Reubin Milan, MD   40 mg at 12/24/19 1035  . promethazine (PHENERGAN) tablet 12.5 mg  12.5 mg Oral Q8H PRN Reubin Milan, MD         Discharge Medications: Please see discharge summary for a list of discharge medications.  Relevant Imaging Results:  Relevant Lab Results:   Additional Information SSN: [id number], LCSW

## 2019-12-24 NOTE — Care Management Obs Status (Signed)
[place] NOTIFICATION   Patient Details  Name: Traci Parker MRN: [id number] Date of Birth: 22-Dec-1945   Medicare Observation Status Notification Given:  Yes    Natasha Bence, LCSW 12/24/2019, 3:47 PM

## 2019-12-24 NOTE — TOC Progression Note (Signed)
Received consult for SNF placement. Pt was recently discharged from [facility] and is now here Observation status after failing at home. PT eval pending. Will follow up with pt and family once PT recommendations are made. Pt will need insurance authorization for SNF placement if pt desires placement.   TOC will follow up in AM.

## 2019-12-24 NOTE — Progress Notes (Signed)
PROGRESS NOTE    Traci Parker  OZY:[id number] DOB: April 16, 1946 DOA: 12/23/2019 PCP: Doree Albee, MD    Brief Narrative:  74 year old female with a history of COPD, anemia, hypertension, who was discharged from the hospital recently after being treated for nausea and vomiting.  She was discharged home where she lives alone.  She continued to have multiple falls and was brought back to the hospital for failure to thrive and unsafe home environment.  She is being evaluated by physical therapy and may need skilled nursing facility placement.   Assessment & Plan:   Principal Problem:   Failure to thrive in adult Active Problems:   Hyperlipidemia   Open-angle glaucoma   Microcytic anemia   Hypertension   COPD (chronic obstructive pulmonary disease) (HCC)   Type 2 diabetes mellitus (HCC)   Hypokalemia   Hypomagnesemia   Fall   Lung nodule < 6cm on CT   1. Failure to thrive/generalized weakness.  Patient was living at home alone and was having multiple falls.  Physical therapy consulted to assess if she would be appropriate/safe for at skilled nursing facility.  Patient is interested in pursuing rehab. 2. Fall at home.  No obvious injuries at this time.  May need skilled nursing facility placement. 3. Microcytic anemia.  Recent anemia panel did not indicate iron deficiency.  May have an element of anemia of chronic disease.  Will transfuse for hemoglobin less than 7. 4. Incidental finding of lung nodule on CT.  Will need further surveillance as an outpatient. 5. Hypertension.  Continue on amlodipine. 6. COPD.  No complains of shortness of breath at this time.  Continue bronchodilators as needed. 7. Hypokalemia.  Replace 8. Nausea and vomiting.  Recent admission for nausea and vomiting which felt to be related to gastroparesis.  She was discharged on Reglan.  Symptoms appear to have improved at this time.   DVT prophylaxis: SCDs Start: 12/23/19 2013  Code Status: Full  code Family Communication: None present Disposition Plan: Status is: Inpatient  Remains inpatient appropriate because:Unsafe d/c plan   Dispo: The patient is from: Home              Anticipated d/c is to: SNF              Anticipated d/c date is: 1 day               Patient currently is not medically stable to d/c.    Consultants:     Procedures:     Antimicrobials:       Subjective: Complains of generalized weakness.  She has tingling in her hands and feet bilaterally.  She has been falling lately.  She does not have any nausea or vomiting right now.  Objective: Vitals:   12/24/19 0612 12/24/19 1012 12/24/19 1433 12/24/19 2024  BP: 121/81 122/78 (!) 144/72   Pulse: 99 89 99   Resp: 18     Temp: 98.2 F (36.8 C) 98 F (36.7 C)    TempSrc: Oral     SpO2: 99% 98% 98% 95%  Weight:      Height:        Intake/Output Summary (Last 24 hours) at 12/24/2019 2049 Last data filed at 12/24/2019 1700 Gross per 24 hour  Intake 1318.01 ml  Output 1200 ml  Net 118.01 ml   Filed Weights   12/23/19 1529  Weight: 50.8 kg    Examination:  General exam: Appears calm and comfortable  Respiratory system:  Clear to auscultation. Respiratory effort normal. Cardiovascular system: S1 & S2 heard, RRR. No JVD, murmurs, rubs, gallops or clicks. No pedal edema. Gastrointestinal system: Abdomen is nondistended, soft and nontender. No organomegaly or masses felt. Normal bowel sounds heard. Central nervous system: Alert and oriented. No focal neurological deficits. Extremities: Symmetric 3-4/5 power bilaterally. Skin: No rashes, lesions or ulcers Psychiatry: Judgement and insight appear normal. Mood & affect appropriate.     Data Reviewed: I have personally reviewed following labs and imaging studies  CBC: Recent Labs  Lab 12/18/19 1245 12/19/19 0444 12/20/19 0608 12/23/19 1603 12/24/19 0725  WBC 8.8 8.1 7.6 8.2 6.5  NEUTROABS 7.5  --   --   --   --   HGB 8.3* 7.1* 7.3*  7.3* 7.0*  HCT 28.3* 24.2* 24.2* 25.2* 24.3*  MCV 59.0* 58.0* 58.7* 58.3* 58.3*  PLT 277 239 243 220 086   Basic Metabolic Panel: Recent Labs  Lab 12/18/19 1245 12/19/19 0444 12/20/19 0608 12/23/19 1603 12/23/19 1835 12/24/19 0725  NA 138 140 136 137  --  138  K 3.8 3.6 3.6 3.2*  --  4.2  CL 100 107 102 103  --  107  CO2 22 21* 20* 21*  --  21*  GLUCOSE 120* 91 99 114*  --  90  BUN 25* 23 17 15   --  10  CREATININE 1.95* 1.42* 1.33* 1.46*  --  1.18*  CALCIUM 10.0 9.1 9.4 9.4  --  8.7*  MG  --   --   --   --  1.5*  --   PHOS  --   --   --   --  3.3  --    GFR: Estimated Creatinine Clearance: 33 mL/min (A) (by C-G formula based on SCr of 1.18 mg/dL (H)). Liver Function Tests: Recent Labs  Lab 12/18/19 1245 12/23/19 1603  AST 16 13*  ALT 12 11  ALKPHOS 68 54  BILITOT 1.0 0.7  PROT 7.8 6.8  ALBUMIN 4.3 4.1   Recent Labs  Lab 12/18/19 1245  LIPASE 25   No results for input(s): AMMONIA in the last 168 hours. Coagulation Profile: No results for input(s): INR, PROTIME in the last 168 hours. Cardiac Enzymes: No results for input(s): CKTOTAL, CKMB, CKMBINDEX, TROPONINI in the last 168 hours. BNP (last 3 results) No results for input(s): PROBNP in the last 8760 hours. HbA1C: No results for input(s): HGBA1C in the last 72 hours. CBG: Recent Labs  Lab 12/24/19 0738 12/24/19 1112 12/24/19 1629  GLUCAP 94 103* 113*   Lipid Profile: No results for input(s): CHOL, HDL, LDLCALC, TRIG, CHOLHDL, LDLDIRECT in the last 72 hours. Thyroid Function Tests: No results for input(s): TSH, T4TOTAL, FREET4, T3FREE, THYROIDAB in the last 72 hours. Anemia Panel: No results for input(s): VITAMINB12, FOLATE, FERRITIN, TIBC, IRON, RETICCTPCT in the last 72 hours. Sepsis Labs: No results for input(s): PROCALCITON, LATICACIDVEN in the last 168 hours.  Recent Results (from the past 240 hour(s))  SARS Coronavirus 2 by RT PCR (hospital order, performed in [facility] hospital lab)  Nasopharyngeal Nasopharyngeal Swab     Status: None   Collection Time: 12/18/19  5:27 PM   Specimen: Nasopharyngeal Swab  Result Value Ref Range Status   SARS Coronavirus 2 NEGATIVE NEGATIVE Final    Comment: (NOTE) SARS-CoV-2 target nucleic acids are NOT DETECTED. The SARS-CoV-2 RNA is generally detectable in upper and lower respiratory specimens during the acute phase of infection. The lowest concentration of SARS-CoV-2 viral copies this  assay can detect is 250 copies / mL. A negative result does not preclude SARS-CoV-2 infection and should not be used as the sole basis for treatment or other patient management decisions.  A negative result may occur with improper specimen collection / handling, submission of specimen other than nasopharyngeal swab, presence of viral mutation(s) within the areas targeted by this assay, and inadequate number of viral copies (<250 copies / mL). A negative result must be combined with clinical observations, patient history, and epidemiological information. Fact Sheet for Patients:   StrictlyIdeas.no Fact Sheet for Healthcare Providers: BankingDealers.co.za This test is not yet approved or cleared  by the [place] FDA and has been authorized for detection and/or diagnosis of SARS-CoV-2 by FDA under an Emergency Use Authorization (EUA).  This EUA will remain in effect (meaning this test can be used) for the duration of the COVID-19 declaration under Section 564(b)(1) of the Act, 21 U.S.C. section 360bbb-3(b)(1), unless the authorization is terminated or revoked sooner. Performed at [facility], [address] [place], [place] [address]   SARS Coronavirus 2 by RT PCR (hospital order, performed in [facility] hospital lab) Nasopharyngeal Nasopharyngeal Swab     Status: None   Collection Time: 12/23/19  8:10 PM   Specimen: Nasopharyngeal Swab  Result Value Ref Range Status   SARS Coronavirus 2 NEGATIVE  NEGATIVE Final    Comment: (NOTE) SARS-CoV-2 target nucleic acids are NOT DETECTED.  The SARS-CoV-2 RNA is generally detectable in upper and lower respiratory specimens during the acute phase of infection. The lowest concentration of SARS-CoV-2 viral copies this assay can detect is 250 copies / mL. A negative result does not preclude SARS-CoV-2 infection and should not be used as the sole basis for treatment or other patient management decisions.  A negative result may occur with improper specimen collection / handling, submission of specimen other than nasopharyngeal swab, presence of viral mutation(s) within the areas targeted by this assay, and inadequate number of viral copies (<250 copies / mL). A negative result must be combined with clinical observations, patient history, and epidemiological information.  Fact Sheet for Patients:   StrictlyIdeas.no  Fact Sheet for Healthcare Providers: BankingDealers.co.za  This test is not yet approved or  cleared by the [place] FDA and has been authorized for detection and/or diagnosis of SARS-CoV-2 by FDA under an Emergency Use Authorization (EUA).  This EUA will remain in effect (meaning this test can be used) for the duration of the COVID-19 declaration under Section 564(b)(1) of the Act, 21 U.S.C. section 360bbb-3(b)(1), unless the authorization is terminated or revoked sooner.  Performed at [facility], [address], [place], [place] [address]          Radiology Studies: CT ANGIO CHEST PE W OR WO CONTRAST  Result Date: 12/23/2019 CLINICAL DATA:  Dyspnea on exertion, elevated D-dimer and shortness of breath for 2 weeks. EXAM: CT ANGIOGRAPHY CHEST WITH CONTRAST TECHNIQUE: Multidetector CT imaging of the chest was performed using the standard protocol during bolus administration of intravenous contrast. Multiplanar CT image reconstructions and MIPs were obtained to evaluate the  vascular anatomy. CONTRAST:  37mL OMNIPAQUE IOHEXOL 350 MG/ML SOLN COMPARISON:  Abdomen and pelvis CT December 15, 2019 FINDINGS: Cardiovascular: Calcific and noncalcific atheromatous plaque in the thoracic aorta. Moderate and with 3.6 cm caliber of ascending thoracic aorta. Heart size is normal without pericardial effusion. Opacification of pulmonary vasculature without sign of pulmonary embolism. Mediastinum/Nodes: Thoracic inlet structures are normal. No axillary lymphadenopathy. No mediastinal lymphadenopathy. No  hilar lymphadenopathy. Esophagus is mildly patulous. Lungs/Pleura: Paraseptal emphysema, mild and worse at the lung apices. No consolidation. No pleural effusion. Small nodule along the fissure in the RIGHT lower lobe (image 78, series 8) 3 mm. No consolidation. No pleural effusion. Upper Abdomen: Incidental imaging of upper abdominal contents without acute upper abdominal process. Limited visualization of viscera in the upper abdomen. Musculoskeletal: No acute musculoskeletal process. Review of the MIP images confirms the above findings. IMPRESSION: 1. No evidence of pulmonary embolism. 2. Paraseptal emphysema, mild and worse at the lung apices. 3. 3 mm nodule along the fissure in the RIGHT lower lobe. No follow-up needed if patient is low-risk. Non-contrast chest CT can be considered in 12 months if patient is high-risk. This recommendation follows the consensus statement: Guidelines for Management of Incidental Pulmonary Nodules Detected on CT Images: From the Fleischner Society 2017; Radiology 2017; 284:228-243. 4. Moderate and with 3.6 cm caliber of ascending thoracic aorta. Recommend annual imaging followup by CTA or MRA. This recommendation follows 2010 ACCF/AHA/AATS/ACR/ASA/SCA/SCAI/SIR/STS/SVM Guidelines for the Diagnosis and Management of Patients with Thoracic Aortic Disease. Circulation.2010; 121: F276-D470. Aortic aneurysm NOS (ICD10-I71.9) 5. Emphysema and aortic atherosclerosis. Aortic  Atherosclerosis (ICD10-I70.0) and Emphysema (ICD10-J43.9). Electronically Signed   By: Zetta Bills M.D.   On: 12/23/2019 18:47   DG Chest Port 1 View  Result Date: 12/23/2019 CLINICAL DATA:  Shortness of breath EXAM: PORTABLE CHEST 1 VIEW COMPARISON:  12/18/2019 FINDINGS: The heart size and mediastinal contours are within normal limits. Both lungs are clear. The visualized skeletal structures are unremarkable. IMPRESSION: No active disease. Electronically Signed   By: Inez Catalina M.D.   On: 12/23/2019 16:23        Scheduled Meds: . amLODipine  10 mg Oral q morning - 10a  . dorzolamide  1 drop Both Eyes BID  . feeding supplement (ENSURE ENLIVE)  237 mL Oral BID BM  . latanoprost  1 drop Both Eyes QHS  . metoCLOPramide  5 mg Oral TID  . pantoprazole  40 mg Oral Daily   Continuous Infusions:   LOS: 0 days    Time spent: 44mins    Kathie Dike, MD [facility]   If 7PM-7AM, please contact night-coverage [email or web address]  12/24/2019, 8:49 PM

## 2019-12-24 NOTE — Plan of Care (Signed)

## 2019-12-25 ENCOUNTER — Other Ambulatory Visit (HOSPITAL_COMMUNITY): Payer: Medicare Other

## 2019-12-25 ENCOUNTER — Other Ambulatory Visit (HOSPITAL_COMMUNITY): Payer: Self-pay | Admitting: *Deleted

## 2019-12-25 DIAGNOSIS — R627 Adult failure to thrive: Principal | ICD-10-CM

## 2019-12-25 LAB — BASIC METABOLIC PANEL
Anion gap: 8 (ref 5–15)
BUN: 8 mg/dL (ref 8–23)
CO2: 22 mmol/L (ref 22–32)
Calcium: 8.8 mg/dL — ABNORMAL LOW (ref 8.9–10.3)
Chloride: 105 mmol/L (ref 98–111)
Creatinine, Ser: 1.08 mg/dL — ABNORMAL HIGH (ref 0.44–1.00)
GFR calc Af Amer: 58 mL/min — ABNORMAL LOW (ref >60)
GFR calc non Af Amer: 50 mL/min — ABNORMAL LOW (ref >60)
Glucose, Bld: 111 mg/dL — ABNORMAL HIGH (ref 70–99)
Potassium: 4.2 mmol/L (ref 3.5–5.1)
Sodium: 135 mmol/L (ref 135–145)

## 2019-12-25 LAB — CBC
HCT: 23.5 % — ABNORMAL LOW (ref 36.0–46.0)
Hemoglobin: 7.1 g/dL — ABNORMAL LOW (ref 12.0–15.0)
MCH: 17.4 pg — ABNORMAL LOW (ref 26.0–34.0)
MCHC: 30.2 g/dL (ref 30.0–36.0)
MCV: 57.6 fL — ABNORMAL LOW (ref 80.0–100.0)
Platelets: 215 10*3/uL (ref 150–400)
RBC: 4.08 MIL/uL (ref 3.87–5.11)
RDW: 23.1 % — ABNORMAL HIGH (ref 11.5–15.5)
WBC: 8.2 10*3/uL (ref 4.0–10.5)
nRBC: 0 % (ref 0.0–0.2)

## 2019-12-25 LAB — PREPARE RBC (CROSSMATCH)

## 2019-12-25 LAB — GLUCOSE, CAPILLARY
Glucose-Capillary: 105 mg/dL — ABNORMAL HIGH (ref 70–99)
Glucose-Capillary: 118 mg/dL — ABNORMAL HIGH (ref 70–99)
Glucose-Capillary: 128 mg/dL — ABNORMAL HIGH (ref 70–99)
Glucose-Capillary: 102 mg/dL — ABNORMAL HIGH (ref 70–99)

## 2019-12-25 LAB — MAGNESIUM: Magnesium: 1.6 mg/dL — ABNORMAL LOW (ref 1.7–2.4)

## 2019-12-25 MED ORDER — ADULT MULTIVITAMIN W/MINERALS CH
1.0000 | ORAL_TABLET | Freq: Every day | ORAL | Status: DC
  Administered 2019-12-25 – 2019-12-27 (×3): 1 via ORAL
  Filled 2019-12-25 (×3): qty 1

## 2019-12-25 MED ORDER — MEGESTROL ACETATE 400 MG/10ML PO SUSP
400.0000 mg | Freq: Every day | ORAL | Status: DC
  Administered 2019-12-26 – 2019-12-27 (×2): 400 mg via ORAL
  Filled 2019-12-25 (×2): qty 10

## 2019-12-25 MED ORDER — SODIUM CHLORIDE 0.9% IV SOLUTION: Freq: Once | INTRAVENOUS | Status: DC

## 2019-12-25 MED ORDER — FUROSEMIDE 10 MG/ML IJ SOLN
20.0000 mg | Freq: Once | INTRAMUSCULAR | Status: AC
  Administered 2019-12-26: 20 mg via INTRAVENOUS
  Filled 2019-12-25: qty 2

## 2019-12-25 MED ORDER — MAGNESIUM SULFATE 2 GM/50ML IV SOLN
2.0000 g | Freq: Once | INTRAVENOUS | Status: AC
  Administered 2019-12-25: 2 g via INTRAVENOUS
  Filled 2019-12-25: qty 50

## 2019-12-25 NOTE — Progress Notes (Signed)
PROGRESS NOTE    Traci Parker  OZD:[id number] DOB: [birth year] DOA: 12/23/2019 PCP: Doree Albee, MD    Brief Narrative:  74 year old female with a history of COPD, anemia, hypertension, who was discharged from the hospital recently after being treated for nausea and vomiting.  She was discharged home where she lives alone.  She continued to have multiple falls and was brought back to the hospital for failure to thrive and unsafe home environment.  She is being evaluated by physical therapy and may need skilled nursing facility placement. --Symptomatic anemia with fatigue, weakness dyspnea on exertion and recurrent falls--receiving 1 unit of PRBC after discussions with her hematologist Dr. Delton Coombes   Assessment & Plan:   Principal Problem:   Failure to thrive in adult Active Problems:   Hyperlipidemia   Open-angle glaucoma   Microcytic anemia   Hypertension   COPD (chronic obstructive pulmonary disease) (HCC)   Type 2 diabetes mellitus (HCC)   Hypokalemia   Hypomagnesemia   Fall   Lung nodule < 6cm on CT    1) symptomatic anemia-----patient with acute on chronic anemia--- multifactorial anemia with underlying B12 deficiency currently appropriately replaced with B12 shots, suspected thalassemia minor given degree of microcytosis, as well as chronic anemia of CKD--- PTA was on Procrit/ESA agent by nephrologist Dr. Theador Hawthorne ---Symptomatic anemia with fatigue, weakness dyspnea on exertion and recurrent falls--receiving 1 unit of PRBC after discussions with her hematologist Dr. Delton Coombes  2)Failure to thrive/generalized weakness.  Patient was living at home alone and was having multiple falls.  Physical therapy eval appreciated, recommends SNF rehab  3)Microcytic anemia.  Recent anemia panel did not indicate iron deficiency.  --- Please see #1 above -Had EGD and colonoscopy previously -Last EGD 12/07/2019 plans for repeat EGD on 12/28/2019  4)incidental finding of lung nodule  on CT.  Will need further surveillance as an outpatient.  5)HTN--stable, continue Norvasc   6)COPD--stable without acute exacerbation at this time  7)FEN--nausea, emesis, weight loss and anorexia, work-up for malignancy so far negative -Start Megace for appetite stimulation   DVT prophylaxis:  SCD--- symptomatic anemia requiring transfusion Code Status: Full code Family Communication: Discussed with patient's son and sister at bedside Disposition Plan: Status is: Inpatient  Remains inpatient appropriate because:Unsafe d/c plan   Dispo: The patient is from: Home              Anticipated d/c is to: SNF              Anticipated d/c date is: 1 day               Patient currently is not medically stable to d/c. --Symptomatic anemia awaiting transfusion of 1 unit of PRBC,  Consultants:   Discussed with Dr Gilda Crease  Procedures:   PRBCs--- 12/25/19  Antimicrobials:      Subjective:  -Complains of fatigue, weakness, dyspnea on exertion, ---Symptomatic anemia with fatigue, weakness dyspnea on exertion and recurrent falls--r  - son and sister at bedside, questions answered  Objective: Vitals:   12/24/19 1433 12/24/19 2024 12/24/19 2153 12/25/19 0439  BP: (!) 144/72  138/80 130/85  Pulse: 99  (!) 106 (!) 102  Resp:   18 17  Temp:   98 F (36.7 C) 98 F (36.7 C)  TempSrc:   Oral   SpO2: 98% 95% 99% 96%  Weight:      Height:        Intake/Output Summary (Last 24 hours) at 12/25/2019 1629 Last data filed at  12/25/2019 0500 Gross per 24 hour  Intake 240 ml  Output 1050 ml  Net -810 ml   Filed Weights   12/23/19 1529  Weight: 50.8 kg    Examination:  General exam: Appears calm and comfortable  Respiratory system: Clear to auscultation. Respiratory effort normal. Cardiovascular system: S1 & S2 heard, RRR. No JVD, murmurs, rubs, gallops or clicks. No pedal edema. Gastrointestinal system: Abdomen is nondistended, soft and nontender. . Normal bowel sounds  heard. Central nervous system: Alert and oriented. No focal neurological deficits. Extremities: Symmetric 3-4/5 power bilaterally. Skin: No rashes, lesions or ulcers Psychiatry: Judgement and insight appear normal. Mood & affect appropriate.    Data Reviewed:   CBC: Recent Labs  Lab 12/19/19 0444 12/20/19 0608 12/23/19 1603 12/24/19 0725 12/25/19 0446  WBC 8.1 7.6 8.2 6.5 8.2  HGB 7.1* 7.3* 7.3* 7.0* 7.1*  HCT 24.2* 24.2* 25.2* 24.3* 23.5*  MCV 58.0* 58.7* 58.3* 58.3* 57.6*  PLT 239 243 220 202 326   Basic Metabolic Panel: Recent Labs  Lab 12/19/19 0444 12/20/19 0608 12/23/19 1603 12/23/19 1835 12/24/19 0725 12/25/19 0446  NA 140 136 137  --  138 135  K 3.6 3.6 3.2*  --  4.2 4.2  CL 107 102 103  --  107 105  CO2 21* 20* 21*  --  21* 22  GLUCOSE 91 99 114*  --  90 111*  BUN 23 17 15   --  10 8  CREATININE 1.42* 1.33* 1.46*  --  1.18* 1.08*  CALCIUM 9.1 9.4 9.4  --  8.7* 8.8*  MG  --   --   --  1.5*  --  1.6*  PHOS  --   --   --  3.3  --   --    GFR: Estimated Creatinine Clearance: 36.1 mL/min (A) (by C-G formula based on SCr of 1.08 mg/dL (H)). Liver Function Tests: Recent Labs  Lab 12/23/19 1603  AST 13*  ALT 11  ALKPHOS 54  BILITOT 0.7  PROT 6.8  ALBUMIN 4.1   No results for input(s): LIPASE, AMYLASE in the last 168 hours. No results for input(s): AMMONIA in the last 168 hours. Coagulation Profile: No results for input(s): INR, PROTIME in the last 168 hours. Cardiac Enzymes: No results for input(s): CKTOTAL, CKMB, CKMBINDEX, TROPONINI in the last 168 hours. BNP (last 3 results) No results for input(s): PROBNP in the last 8760 hours. HbA1C: No results for input(s): HGBA1C in the last 72 hours. CBG: Recent Labs  Lab 12/24/19 1112 12/24/19 1629 12/24/19 2155 12/25/19 0738 12/25/19 1106  GLUCAP 103* 113* 102* 105* 118*   Lipid Profile: No results for input(s): CHOL, HDL, LDLCALC, TRIG, CHOLHDL, LDLDIRECT in the last 72 hours. Thyroid Function  Tests: No results for input(s): TSH, T4TOTAL, FREET4, T3FREE, THYROIDAB in the last 72 hours. Anemia Panel: No results for input(s): VITAMINB12, FOLATE, FERRITIN, TIBC, IRON, RETICCTPCT in the last 72 hours. Sepsis Labs: No results for input(s): PROCALCITON, LATICACIDVEN in the last 168 hours.  Recent Results (from the past 240 hour(s))  SARS Coronavirus 2 by RT PCR (hospital order, performed in [facility] hospital lab) Nasopharyngeal Nasopharyngeal Swab     Status: None   Collection Time: 12/18/19  5:27 PM   Specimen: Nasopharyngeal Swab  Result Value Ref Range Status   SARS Coronavirus 2 NEGATIVE NEGATIVE Final    Comment: (NOTE) SARS-CoV-2 target nucleic acids are NOT DETECTED. The SARS-CoV-2 RNA is generally detectable in upper and lower respiratory specimens during the  acute phase of infection. The lowest concentration of SARS-CoV-2 viral copies this assay can detect is 250 copies / mL. A negative result does not preclude SARS-CoV-2 infection and should not be used as the sole basis for treatment or other patient management decisions.  A negative result may occur with improper specimen collection / handling, submission of specimen other than nasopharyngeal swab, presence of viral mutation(s) within the areas targeted by this assay, and inadequate number of viral copies (<250 copies / mL). A negative result must be combined with clinical observations, patient history, and epidemiological information. Fact Sheet for Patients:   StrictlyIdeas.no Fact Sheet for Healthcare Providers: BankingDealers.co.za This test is not yet approved or cleared  by the [place] FDA and has been authorized for detection and/or diagnosis of SARS-CoV-2 by FDA under an Emergency Use Authorization (EUA).  This EUA will remain in effect (meaning this test can be used) for the duration of the COVID-19 declaration under Section 564(b)(1) of the Act, 21  U.S.C. section 360bbb-3(b)(1), unless the authorization is terminated or revoked sooner. Performed at [facility], [address], [place], [place] [address]   Urine culture     Status: Abnormal (Preliminary result)   Collection Time: 12/23/19  7:50 PM   Specimen: Urine, Clean Catch  Result Value Ref Range Status   Specimen Description   Final    URINE, CLEAN CATCH Performed at [facility], [address], [place], [place] [address]    Special Requests   Final    NONE Performed at [facility], [address], [place], [place] [address]    Culture (A)  Final    >=100,000 COLONIES/mL ESCHERICHIA COLI SUSCEPTIBILITIES TO FOLLOW Performed at [place] [address], [place], [place] [address]    Report Status PENDING  Incomplete  SARS Coronavirus 2 by RT PCR (hospital order, performed in [facility] hospital lab) Nasopharyngeal Nasopharyngeal Swab     Status: None   Collection Time: 12/23/19  8:10 PM   Specimen: Nasopharyngeal Swab  Result Value Ref Range Status   SARS Coronavirus 2 NEGATIVE NEGATIVE Final    Comment: (NOTE) SARS-CoV-2 target nucleic acids are NOT DETECTED.  The SARS-CoV-2 RNA is generally detectable in upper and lower respiratory specimens during the acute phase of infection. The lowest concentration of SARS-CoV-2 viral copies this assay can detect is 250 copies / mL. A negative result does not preclude SARS-CoV-2 infection and should not be used as the sole basis for treatment or other patient management decisions.  A negative result may occur with improper specimen collection / handling, submission of specimen other than nasopharyngeal swab, presence of viral mutation(s) within the areas targeted by this assay, and inadequate number of viral copies (<250 copies / mL). A negative result must be combined with clinical observations, patient history, and epidemiological information.  Fact Sheet for Patients:    StrictlyIdeas.no  Fact Sheet for Healthcare Providers: BankingDealers.co.za  This test is not yet approved or  cleared by the [place] FDA and has been authorized for detection and/or diagnosis of SARS-CoV-2 by FDA under an Emergency Use Authorization (EUA).  This EUA will remain in effect (meaning this test can be used) for the duration of the COVID-19 declaration under Section 564(b)(1) of the Act, 21 U.S.C. section 360bbb-3(b)(1), unless the authorization is terminated or revoked sooner.  Performed at [facility], [address], [place], [place] [address]          Radiology Studies: CT ANGIO CHEST PE  W OR WO CONTRAST  Result Date: 12/23/2019 CLINICAL DATA:  Dyspnea on exertion, elevated D-dimer and shortness of breath for 2 weeks. EXAM: CT ANGIOGRAPHY CHEST WITH CONTRAST TECHNIQUE: Multidetector CT imaging of the chest was performed using the standard protocol during bolus administration of intravenous contrast. Multiplanar CT image reconstructions and MIPs were obtained to evaluate the vascular anatomy. CONTRAST:  33mL OMNIPAQUE IOHEXOL 350 MG/ML SOLN COMPARISON:  Abdomen and pelvis CT December 15, 2019 FINDINGS: Cardiovascular: Calcific and noncalcific atheromatous plaque in the thoracic aorta. Moderate and with 3.6 cm caliber of ascending thoracic aorta. Heart size is normal without pericardial effusion. Opacification of pulmonary vasculature without sign of pulmonary embolism. Mediastinum/Nodes: Thoracic inlet structures are normal. No axillary lymphadenopathy. No mediastinal lymphadenopathy. No hilar lymphadenopathy. Esophagus is mildly patulous. Lungs/Pleura: Paraseptal emphysema, mild and worse at the lung apices. No consolidation. No pleural effusion. Small nodule along the fissure in the RIGHT lower lobe (image 78, series 8) 3 mm. No consolidation. No pleural effusion. Upper Abdomen: Incidental imaging of upper abdominal contents  without acute upper abdominal process. Limited visualization of viscera in the upper abdomen. Musculoskeletal: No acute musculoskeletal process. Review of the MIP images confirms the above findings. IMPRESSION: 1. No evidence of pulmonary embolism. 2. Paraseptal emphysema, mild and worse at the lung apices. 3. 3 mm nodule along the fissure in the RIGHT lower lobe. No follow-up needed if patient is low-risk. Non-contrast chest CT can be considered in 12 months if patient is high-risk. This recommendation follows the consensus statement: Guidelines for Management of Incidental Pulmonary Nodules Detected on CT Images: From the Fleischner Society 2017; Radiology 2017; 284:228-243. 4. Moderate and with 3.6 cm caliber of ascending thoracic aorta. Recommend annual imaging followup by CTA or MRA. This recommendation follows 2010 ACCF/AHA/AATS/ACR/ASA/SCA/SCAI/SIR/STS/SVM Guidelines for the Diagnosis and Management of Patients with Thoracic Aortic Disease. Circulation.2010; 121: T465-K812. Aortic aneurysm NOS (ICD10-I71.9) 5. Emphysema and aortic atherosclerosis. Aortic Atherosclerosis (ICD10-I70.0) and Emphysema (ICD10-J43.9). Electronically Signed   By: Zetta Bills M.D.   On: 12/23/2019 18:47        Scheduled Meds:  sodium chloride   Intravenous Once   amLODipine  10 mg Oral q morning - 10a   dorzolamide  1 drop Both Eyes BID   feeding supplement (ENSURE ENLIVE)  237 mL Oral BID BM   furosemide  20 mg Intravenous Once   latanoprost  1 drop Both Eyes QHS   metoCLOPramide  5 mg Oral TID   multivitamin with minerals  1 tablet Oral Daily   pantoprazole  40 mg Oral Daily   Continuous Infusions:   LOS: 1 day   Roxan Hockey, MD [facility]   If 7PM-7AM, please contact night-coverage [email or web address]  12/25/2019, 4:29 PM

## 2019-12-25 NOTE — TOC Initial Note (Signed)
Transition of Care [facility]) - Initial/Assessment Note    Patient Details  Name: Traci Parker MRN: [id number] Date of Birth: [birth year]  Transition [facility]) CM/SW Contact:    Shade Flood, LCSW Phone Number: 12/25/2019, 11:32 AM  Clinical Narrative:                  Pt recently discharged home from [facility]. TOC had assisted family in referral for PCS hours upon the last discharge. Pt lives alone in her apartment. Anticipating PT will recommend SNF rehab. Spoke with pt's son in pt's room and he and pt request referrals to local SNF for short term rehab.   SNF referrals sent out. Insurance auth started. Will follow up with family once bed offers available.  Expected Discharge Plan: Skilled Nursing Facility Barriers to Discharge: Continued Medical Work up   Patient Goals and CMS Choice Patient states their goals for this hospitalization and ongoing recovery are:: rehab CMS Medicare.gov Compare Post Acute Care list provided to:: Patient Represenative (must comment) Choice offered to / list presented to : Adult Children  Expected Discharge Plan and Services Expected Discharge Plan: [place] In-house Referral: Clinical Social Work   Post Acute Care Choice: [place] Living arrangements for the past 2 months: Apartment                                      Prior Living Arrangements/Services Living arrangements for the past 2 months: Apartment Lives with:: Self Patient language and need for interpreter reviewed:: Yes Do you feel safe going back to the place where you live?: No   Needs rehab  Need for Family Participation in Patient Care: Yes (Comment) Care giver support system in place?: Yes (comment) Current home services: DME Criminal Activity/Legal Involvement Pertinent to Current Situation/Hospitalization: No - Comment as needed  Activities of Daily Living Home Assistive Devices/Equipment: CBG Meter ADL Screening (condition at  time of admission) Patient's cognitive ability adequate to safely complete daily activities?: Yes Is the patient deaf or have difficulty hearing?: No Does the patient have difficulty seeing, even when wearing glasses/contacts?: No Does the patient have difficulty concentrating, remembering, or making decisions?: No Patient able to express need for assistance with ADLs?: Yes Does the patient have difficulty dressing or bathing?: Yes Independently performs ADLs?: No Communication: Independent Dressing (OT): Needs assistance Is this a change from baseline?: Pre-admission baseline Grooming: Needs assistance Is this a change from baseline?: Pre-admission baseline Feeding: Needs assistance Is this a change from baseline?: Pre-admission baseline Bathing: Needs assistance Is this a change from baseline?: Pre-admission baseline Toileting: Needs assistance Is this a change from baseline?: Pre-admission baseline In/Out Bed: Needs assistance Is this a change from baseline?: Pre-admission baseline Walks in Home: Needs assistance Is this a change from baseline?: Pre-admission baseline Does the patient have difficulty walking or climbing stairs?: Yes Weakness of Legs: Both Weakness of Arms/Hands: Both  Permission Sought/Granted Permission sought to share information with : Chartered certified accountant granted to share information with : Yes, Verbal Permission Granted     Permission granted to share info w AGENCY: Local SNFs        Emotional Assessment       Orientation: : Oriented to Self, Oriented to Place, Oriented to Situation Alcohol / Substance Use: Not Applicable Psych Involvement: No (comment)  Admission diagnosis:  Failure to thrive in adult [R62.7] Patient Active Problem  List   Diagnosis Date Noted  . Lung nodule < 6cm on CT 12/24/2019  . Failure to thrive in adult 12/23/2019  . COPD (chronic obstructive pulmonary disease) ([place]) 12/23/2019  . Type 2 diabetes  mellitus ([place]) 12/23/2019  . Hypokalemia 12/23/2019  . Hypomagnesemia 12/23/2019  . Fall 12/23/2019  . Intractable nausea and vomiting 12/19/2019  . Refractory nausea and vomiting 12/18/2019  . Volume depletion 12/18/2019  . Weight loss 12/18/2019  . Gastritis by recent EGD 12/18/2019  . Dehydration 12/13/2019  . Nausea and vomiting 10/30/2019  . Fatigue 10/30/2019  . Osteoporosis 09/20/2019  . Hypertension 09/20/2019  . Microcytic anemia 05/08/2019  . Screening for breast cancer 03/23/2019  . Hyperlipidemia 03/23/2019  . Pre-diabetes 03/23/2019  . Open-angle glaucoma 03/23/2019   PCP:  Doree Albee, MD Pharmacy:   [facility], [place] [address] Phone: [phone] Fax: [phone]     Social Determinants of Health (SDOH) Interventions    Readmission Risk Interventions Readmission Risk Prevention Plan 12/25/2019  Transportation Screening Complete  Home Care Screening Not Complete  Home Care Screening Not Completed Comments SNF referral  Medication Review (RN CM) Complete  Some recent data might be hidden

## 2019-12-25 NOTE — Evaluation (Signed)
Physical Therapy Evaluation Patient Details Name: Traci Parker MRN: [id number] DOB: 17-Sep-1945 Today's Date: 12/25/2019   History of Present Illness  74 y.o. female with medical history significant of seasonal allergies, pernicious anemia, asthma/COPD, history of blood transfusion, glaucoma, history of hematuria, hypertension, osteoporosis, type 2 diabetes who is coming back to the hospital after being discharged 2 days ago due to generalized weakness and having falls at home.  Her son and other family members are concerned because she lives at home alone.  She denies fever, but complains of chills and fatigue.  No rhinorrhea, sore throat, wheezing or hemoptysis, but has been feeling short of breath on exertion.  She denies chest pain, palpitations, dizziness, diaphoresis, PND, orthopnea or recent pitting edema of the lower extremities.  She denies abdominal pain, diarrhea, constipation, melena or hematochezia.  No dysuria, frequency or materia.  No polyuria, polydipsia, polyphagia or blurred vision.  Clinical Impression  Pt admitted with above diagnosis. Pt with fear of falling 2* previous falls in the past, requiring cues and encouragement to ease pt's mind with mobility. Pt unable to perform transfers with AD and instead with therapist positioned in front of pt assisting pt over to recliner. Pt able to take steps at bedside with HHA, but unable to ambulate further distance due to fear of falling. Pt maintains forward flexed trunk and bottom out posture despite cues for upright tall posture. Pt's son in room for eval, reports they are unable to provide this level of care for pt due to declining status. Pt is pleasant and follows all commands, motivated to regain strength and independence. Pt currently with functional limitations due to the deficits listed below (see PT Problem List). Pt will benefit from skilled PT to increase their independence and safety with mobility to allow discharge to the venue  listed below.       Follow Up Recommendations SNF    Equipment Recommendations  None recommended by PT (TBD at facility upon d/c)    Recommendations for Other Services OT consult     Precautions / Restrictions Precautions Precautions: Fall Restrictions Weight Bearing Restrictions: No      Mobility  Bed Mobility Overal bed mobility: Needs Assistance Bed Mobility: Supine to Sit  Supine to sit: Min assist;HOB elevated  General bed mobility comments: slow, labored movement, use of bedrail, cues for sequencing to come to sitting at EOB, cues to decrease anxiousness with coming to sitting at EOB  Transfers Overall transfer level: Needs assistance Equipment used: 1 person hand held assist Transfers: Sit to/from Stand;Stand Pivot Transfers Sit to Stand: Mod assist;From elevated surface Stand pivot transfers: Mod assist;From elevated surface  General transfer comment: mod A from raised bed, BLE braced on bed, posterior weight with trunk flexed and bottom out despite cues for upright posture, pt very fearful of falling requiring cues to power up and with pivoting to ease pt  Ambulation/Gait Ambulation/Gait assistance: Mod assist   Assistive device: 1 person hand held assist Gait Pattern/deviations: Step-to pattern;Shuffle;Trunk flexed Gait velocity: decreased   General Gait Details: limited to steps at bedside with flexed trunk and bottom out, cues for upright posture without improvement, shakiness upon standing 2* anxiousness with gait, unable to attempt with RW due to anxiousness  Stairs            Wheelchair Mobility    Modified Rankin (Stroke Patients Only)       Balance Overall balance assessment: Needs assistance;History of Falls Sitting-balance support: Feet supported;Bilateral upper extremity supported Sitting  balance-Leahy Scale: Fair Sitting balance - Comments: seated EOB, close SUPV   Standing balance support: During functional activity;Bilateral upper  extremity supported Standing balance-Leahy Scale: Poor Standing balance comment: reliant on therapist, mod assist              Pertinent Vitals/Pain Pain Assessment: No/denies pain    Home Living Family/patient expects to be discharged to:: Skilled nursing facility Living Arrangements: Alone Available Help at Discharge: Family;Available PRN/intermittently Type of Home: House Home Access: Stairs to enter Entrance Stairs-Rails: None Entrance Stairs-Number of Steps: 1 Home Layout: One level Home Equipment: Other (comment) (rollator)      Prior Function Level of Independence: Needs assistance   Gait / Transfers Assistance Needed: limited household distances with rollator and famliy assisting  ADL's / Homemaking Assistance Needed: sponge bath and dressing indepedent with increased time, family providing housecleaning, cooking, driving  Comments: Son at bedside reports steady decline since March 2021, prior to March 2021 pt was ind with ADLs, community ambulator without AD, driving and no requires assist and SUPV     Hand Dominance   Dominant Hand: Right    Extremity/Trunk Assessment   Upper Extremity Assessment Upper Extremity Assessment: Generalized weakness    Lower Extremity Assessment Lower Extremity Assessment: Generalized weakness (AROM <50%, strength 3/5, denies numbness/tingling)    Cervical / Trunk Assessment Cervical / Trunk Assessment: Kyphotic  Communication   Communication: No difficulties  Cognition Arousal/Alertness: Awake/alert Behavior During Therapy: WFL for tasks assessed/performed;Anxious Overall Cognitive Status: Within Functional Limits for tasks assessed  General Comments: pt anxious with mobility, high fear of falling requiring constant cues and encouragement to ease pt      General Comments      Exercises     Assessment/Plan    PT Assessment Patient needs continued PT services  PT Problem List Decreased strength;Decreased range of  motion;Decreased activity tolerance;Decreased balance;Decreased mobility;Decreased knowledge of use of DME       PT Treatment Interventions DME instruction;Gait training;Functional mobility training;Therapeutic activities;Therapeutic exercise;Balance training;Neuromuscular re-education;Patient/family education;Modalities    PT Goals (Current goals can be found in the Care Plan section)  Acute Rehab PT Goals Patient Stated Goal: home after rehab PT Goal Formulation: With patient/family Time For Goal Achievement: 01/08/20 Potential to Achieve Goals: Good    Frequency Min 2X/week   Barriers to discharge        Co-evaluation               AM-PAC PT "6 Clicks" Mobility  Outcome Measure Help needed turning from your back to your side while in a flat bed without using bedrails?: A Little Help needed moving from lying on your back to sitting on the side of a flat bed without using bedrails?: A Lot Help needed moving to and from a bed to a chair (including a wheelchair)?: A Lot Help needed standing up from a chair using your arms (e.g., wheelchair or bedside chair)?: A Lot Help needed to walk in hospital room?: A Lot Help needed climbing 3-5 steps with a railing? : Total 6 Click Score: 12    End of Session Equipment Utilized During Treatment: Gait belt Activity Tolerance: Other (comment) (limited 2* anxiousness/fear of falling) Patient left: in chair;with call bell/phone within reach;with chair alarm set;with family/visitor present Nurse Communication: Mobility status PT Visit Diagnosis: Unsteadiness on feet (R26.81);Other abnormalities of gait and mobility (R26.89);Muscle weakness (generalized) (M62.81);History of falling (Z91.81)    Time: 1610-9604 PT Time Calculation (min) (ACUTE ONLY): 20 min   Charges:  PT Evaluation $PT Eval Moderate Complexity: 1 Mod           Tori Jos Cygan PT, DPT 12/25/19, 11:49 AM [phone]

## 2019-12-25 NOTE — Plan of Care (Signed)
  Problem: Acute Rehab PT Goals(only PT should resolve) Goal: Pt Will Go Supine/Side To Sit Outcome: Progressing Flowsheets (Taken 12/25/2019 1151) Pt will go Supine/Side to Sit: with min guard assist Goal: Pt Will Go Sit To Supine/Side Outcome: Progressing Flowsheets (Taken 12/25/2019 1151) Pt will go Sit to Supine/Side: with min guard assist Goal: Patient Will Transfer Sit To/From Stand Outcome: Progressing Flowsheets (Taken 12/25/2019 1151) Patient will transfer sit to/from stand: with minimal assist Goal: Pt Will Transfer Bed To Chair/Chair To Bed Outcome: Progressing Flowsheets (Taken 12/25/2019 1151) Pt will Transfer Bed to Chair/Chair to Bed: with min assist Goal: Pt Will Ambulate Outcome: Progressing Flowsheets (Taken 12/25/2019 1151) Pt will Ambulate:  25 feet  with moderate assist  with rolling walker   Tori Aarionna Germer PT, DPT 12/25/19, 11:52 AM [phone]

## 2019-12-25 NOTE — Plan of Care (Signed)

## 2019-12-25 NOTE — Progress Notes (Signed)
Initial Nutrition Assessment  DOCUMENTATION CODES:   Non-severe (moderate) malnutrition in context of acute illness/injury  INTERVENTION:  Ensure Enlive po BID, each supplement provides 350 kcal and 20 grams of protein   Assistance with feeding each meal  NUTRITION DIAGNOSIS:   Moderate Malnutrition related to acute illness, poor appetite (recent treatment due to nausea and vomiting) as evidenced by per patient/family report, energy intake < 75% for > or equal to 1 month, mild muscle depletion (Failure to thrive, decline in ADL's).   GOAL:  Patient will meet greater than or equal to 90% of their needs  MONITOR:     REASON FOR ASSESSMENT:   Consult  (Failure to Thrive)  ASSESSMENT: Patient is a 74 yo female with history of COPD, DM-2, Glaucoma, HTN and multiple falls. EGD on 12/07/19. Presents failure to thrive from home and lives alone.Recently treated for nausea and vomiting. Discharge planning for SNF in process.  Patient complaining of tingling and difficulty using her hands for tasks such as feeding herself and / or meal preparation. Patient reports last BM 6/7 and includes that is normal for her.  Her lunch tray is here and 0% consumed. Patient says "I am too sleepy to eat right now". Son is at bedside. He reports she has only been taking in fluids - no solid foods the past several weeks. She has taken a few sips of Ensure this morning but requires someone to assist.   Weight loss since April -12.8%/ 7.4 kg. Severe for timeframe. BMI below desired range for age.   Medications reviewed and include: Reglan, Protonix.   Labs: BMP Latest Ref Rng & Units 12/25/2019 12/24/2019 12/23/2019  Glucose 70 - 99 mg/dL 111(H) 90 114(H)  BUN 8 - 23 mg/dL 8 10 15   Creatinine 0.44 - 1.00 mg/dL 1.08(H) 1.18(H) 1.46(H)  BUN/Creat Ratio 6 - 22 (calc) - - -  Sodium 135 - 145 mmol/L 135 138 137  Potassium 3.5 - 5.1 mmol/L 4.2 4.2 3.2(L)  Chloride 98 - 111 mmol/L 105 107 103  CO2 22 - 32  mmol/L 22 21(L) 21(L)  Calcium 8.9 - 10.3 mg/dL 8.8(L) 8.7(L) 9.4     NUTRITION - FOCUSED PHYSICAL EXAM:    Most Recent Value  Orbital Region Mild depletion  Upper Arm Region No depletion  Thoracic and Lumbar Region Mild depletion  Buccal Region No depletion  Temple Region Moderate depletion  Clavicle Bone Region Mild depletion  Scapular Bone Region Unable to assess  Dorsal Hand Unable to assess  [patient unable to participate]  Patellar Region Moderate depletion  Anterior Thigh Region Mild depletion  Posterior Calf Region Unable to assess  Edema (RD Assessment) None  Hair Reviewed  Eyes Reviewed  Mouth Reviewed  Skin Reviewed  Nails Reviewed     Diet Order:   Diet Order            Diet full liquid Room service appropriate? Yes; Fluid consistency: Thin  Diet effective now                 EDUCATION NEEDS:   Education needs have been addressed Skin:  Skin Assessment: Reviewed RN Assessment  Last BM:  12/18/19  Height:   Ht Readings from Last 1 Encounters:  12/23/19 5\' 4"  (1.626 m)    Weight:   Wt Readings from Last 1 Encounters:  12/23/19 50.8 kg    Ideal Body Weight:   55 kg  BMI:  Body mass index is 19.22 kg/m.  Estimated Nutritional Needs:  Kcal:  1500-1700 kcal  Protein:  65-70 gr/ day  Fluid:  >1500 ml daily   Colman Cater MS,RD,CSG,LDN Pager: available through [employer]

## 2019-12-26 ENCOUNTER — Other Ambulatory Visit (HOSPITAL_COMMUNITY): Payer: Self-pay | Admitting: Nurse Practitioner

## 2019-12-26 ENCOUNTER — Telehealth: Payer: Self-pay

## 2019-12-26 LAB — BPAM RBC
Blood Product Expiration Date: 202106302359
ISSUE DATE / TIME: 202106142255
Unit Type and Rh: 5100

## 2019-12-26 LAB — CBC
HCT: 30 % — ABNORMAL LOW (ref 36.0–46.0)
Hemoglobin: 9.5 g/dL — ABNORMAL LOW (ref 12.0–15.0)
MCH: 20.1 pg — ABNORMAL LOW (ref 26.0–34.0)
MCHC: 31.7 g/dL (ref 30.0–36.0)
MCV: 63.6 fL — ABNORMAL LOW (ref 80.0–100.0)
Platelets: 205 10*3/uL (ref 150–400)
RBC: 4.72 MIL/uL (ref 3.87–5.11)
RDW: 30.8 % — ABNORMAL HIGH (ref 11.5–15.5)
WBC: 7.9 10*3/uL (ref 4.0–10.5)
nRBC: 0.4 % — ABNORMAL HIGH (ref 0.0–0.2)

## 2019-12-26 LAB — RENAL FUNCTION PANEL
Albumin: 3.6 g/dL (ref 3.5–5.0)
Anion gap: 9 (ref 5–15)
BUN: 13 mg/dL (ref 8–23)
CO2: 25 mmol/L (ref 22–32)
Calcium: 9.2 mg/dL (ref 8.9–10.3)
Chloride: 102 mmol/L (ref 98–111)
Creatinine, Ser: 1.21 mg/dL — ABNORMAL HIGH (ref 0.44–1.00)
GFR calc Af Amer: 51 mL/min — ABNORMAL LOW (ref >60)
GFR calc non Af Amer: 44 mL/min — ABNORMAL LOW (ref >60)
Glucose, Bld: 110 mg/dL — ABNORMAL HIGH (ref 70–99)
Phosphorus: 3.4 mg/dL (ref 2.5–4.6)
Potassium: 4.1 mmol/L (ref 3.5–5.1)
Sodium: 136 mmol/L (ref 135–145)

## 2019-12-26 LAB — GLUCOSE, CAPILLARY
Glucose-Capillary: 103 mg/dL — ABNORMAL HIGH (ref 70–99)
Glucose-Capillary: 162 mg/dL — ABNORMAL HIGH (ref 70–99)
Glucose-Capillary: 101 mg/dL — ABNORMAL HIGH (ref 70–99)
Glucose-Capillary: 116 mg/dL — ABNORMAL HIGH (ref 70–99)

## 2019-12-26 LAB — URINE CULTURE: Culture: >=100000 — AB

## 2019-12-26 LAB — TYPE AND SCREEN
ABO/RH(D): B POS
Antibody Screen: NEGATIVE
Unit division: 0

## 2019-12-26 NOTE — Telephone Encounter (Signed)
Dr Ardis Hughs the pt is currently admitted at [facility].  She was referred to our office by Dr Laural Golden and is scheduled for EUS on Thursday.  Should she keep the appointment? Santiago Glad [phone]) with [facility] endo has called inquiring about the appt.

## 2019-12-26 NOTE — Progress Notes (Signed)
Physical Therapy Treatment Patient Details Name: Traci Parker MRN: [id number] DOB: 20-Aug-1945 Today's Date: 12/26/2019    History of Present Illness 74 y.o. female with medical history significant of seasonal allergies, pernicious anemia, asthma/COPD, history of blood transfusion, glaucoma, history of hematuria, hypertension, osteoporosis, type 2 diabetes who is coming back to the hospital after being discharged 2 days ago due to generalized weakness and having falls at home.  Her son and other family members are concerned because she lives at home alone.  She denies fever, but complains of chills and fatigue.  No rhinorrhea, sore throat, wheezing or hemoptysis, but has been feeling short of breath on exertion.  She denies chest pain, palpitations, dizziness, diaphoresis, PND, orthopnea or recent pitting edema of the lower extremities.  She denies abdominal pain, diarrhea, constipation, melena or hematochezia.  No dysuria, frequency or materia.  No polyuria, polydipsia, polyphagia or blurred vision.    PT Comments    Pt able to perform seated therapeutic exercises with BLE extended in recliner. Pt with ataxic like movements in BUE when attempting to place on armrests to assist in powering up from chair. Pt also demonstrates fair coordination with LAQ with minimal improvement with cues for control, decreased speed. Pt reports pins/needles in all 4 limbs that isn't painful, but is increasing her fear of falling with standing or ambulation training. Pt on RA with SpO2 92-94% with exercises and HR 105 max with exercise. Patient will benefit from continued physical therapy in hospital and recommendations below to increase strength, balance, endurance for safe ADLs and gait.    Follow Up Recommendations  SNF     Equipment Recommendations  None recommended by PT (TBD at facility upon d/c)    Recommendations for Other Services OT consult     Precautions / Restrictions Precautions Precautions:  Fall Restrictions Weight Bearing Restrictions: No    Mobility  Bed Mobility  General bed mobility comments: up in recliner  Transfers Overall transfer level: Needs assistance Equipment used: Rolling walker (2 wheeled) Transfers: Sit to/from Stand Sit to Stand: Max assist  General transfer comment: max A to power up from recliner, cues for BUE assisting and activating quads/glutes, increased fear of falling requesting to return to sitting once upright  Ambulation/Gait  General Gait Details: pt declined   Stairs             Wheelchair Mobility    Modified Rankin (Stroke Patients Only)       Balance Overall balance assessment: Needs assistance;History of Falls  Standing balance support: During functional activity;Bilateral upper extremity supported Standing balance-Leahy Scale: Poor Standing balance comment: reliant on external surface to support             Cognition Arousal/Alertness: Awake/alert Behavior During Therapy: WFL for tasks assessed/performed;Anxious Overall Cognitive Status: Within Functional Limits for tasks assessed         Exercises General Exercises - Lower Extremity Ankle Circles/Pumps: Seated;Both;10 reps Quad Sets: Seated;Both;10 reps Long Arc Quad: Seated;Both;10 reps Heel Slides: Seated;Both;10 reps    General Comments General comments (skin integrity, edema, etc.): on RA with SpO2 92-94%      Pertinent Vitals/Pain Pain Assessment: No/denies pain    Home Living               Prior Function            PT Goals (current goals can now be found in the care plan section) Acute Rehab PT Goals Patient Stated Goal: home after rehab PT Goal  Formulation: With patient/family Time For Goal Achievement: 01/08/20 Potential to Achieve Goals: Good Progress towards PT goals: Progressing toward goals    Frequency    Min 2X/week      PT Plan Current plan remains appropriate    Co-evaluation              AM-PAC PT  "6 Clicks" Mobility   Outcome Measure  Help needed turning from your back to your side while in a flat bed without using bedrails?: A Little Help needed moving from lying on your back to sitting on the side of a flat bed without using bedrails?: A Lot Help needed moving to and from a bed to a chair (including a wheelchair)?: A Lot Help needed standing up from a chair using your arms (e.g., wheelchair or bedside chair)?: A Lot Help needed to walk in hospital room?: A Lot Help needed climbing 3-5 steps with a railing? : Total 6 Click Score: 12    End of Session   Activity Tolerance: Other (comment) (limited 2* anxiousness/fear of falling) Patient left: in chair;with call bell/phone within reach;with chair alarm set Nurse Communication: Mobility status PT Visit Diagnosis: Unsteadiness on feet (R26.81);Other abnormalities of gait and mobility (R26.89);Muscle weakness (generalized) (M62.81);History of falling (Z91.81)     Time: 1610-9604 PT Time Calculation (min) (ACUTE ONLY): 18 min  Charges:  $Therapeutic Exercise: 8-22 mins                      Tori Ramere Downs PT, DPT 12/26/19, 10:48 AM [phone]

## 2019-12-26 NOTE — Progress Notes (Signed)
PROGRESS NOTE    Traci Parker  OZH:[id number] DOB: [birth year] DOA: 12/23/2019 PCP: Doree Albee, MD    Brief Narrative:  74 year old female with a history of COPD, anemia, hypertension, who was discharged from the hospital recently after being treated for nausea and vomiting.  She was discharged home where she lives alone.  She continued to have multiple falls and was brought back to the hospital for failure to thrive and unsafe home environment.  She is being evaluated by physical therapy and may need skilled nursing facility placement. -Transfused 1 unit of PRBC on 12/25/19 after discussions with her hematologist Dr. Delton Coombes   Assessment & Plan:   Principal Problem:   Failure to thrive in adult Active Problems:   Hyperlipidemia   Open-angle glaucoma   Microcytic anemia   Hypertension   COPD (chronic obstructive pulmonary disease) (HCC)   Type 2 diabetes mellitus (HCC)   Hypokalemia   Hypomagnesemia   Fall   Lung nodule < 6cm on CT    1) symptomatic anemia-----patient with acute on chronic anemia--- multifactorial anemia with underlying B12 deficiency currently appropriately replaced with B12 shots, suspected thalassemia minor given degree of microcytosis, as well as chronic anemia of CKD--- PTA was on Procrit/ESA agent by nephrologist Dr. Theador Hawthorne --Transfused 1 unit of PRBC on 12/25/19 after discussions with her hematologist Dr. Delton Coombes -hgb is up to 9.5  2)Failure to thrive/generalized weakness.  Patient was living at home alone and was having multiple falls.  Physical therapy eval appreciated, recommends SNF rehab  3)Microcytic anemia.  Recent anemia panel did not indicate iron deficiency.  --- Please see #1 above -Had EGD and colonoscopy previously -Last EGD 12/07/2019 plans for repeat EGD on 12/28/2019  4)incidental finding of lung nodule on CT.  Will need further surveillance as an outpatient.  5)HTN--stable, continue Norvasc   6)COPD--stable without acute  exacerbation at this time  7)FEN--nausea, emesis, weight loss and anorexia, work-up for malignancy so far negative -Start Megace for appetite stimulation   DVT prophylaxis:  SCD--- symptomatic anemia requiring transfusion Code Status: Full code Family Communication: Discussed with patient's son and sister at bedside Disposition Plan: Status is: Inpatient  Remains inpatient appropriate because:Unsafe d/c plan  --Awaiting insurance approval for transfer to SNF rehab ,   Dispo: The patient is from: Home              Anticipated d/c is to: SNF              Anticipated d/c date is: 1 day               Patient currently is medically stable to d/c. -Awaiting insurance approval for transfer to SNF rehab ,  Consultants:   Discussed with Dr Gilda Crease  Procedures:   PRBCs--- 12/25/19  Antimicrobials:      Subjective: -Resting comfortably, feels better after transfusion yesterday  -Awaiting insurance approval for transfer to SNF rehab ,  Objective: Vitals:   12/26/19 0213 12/26/19 0439 12/26/19 1320 12/26/19 1408  BP: 140/81 (!) 137/92  118/75  Pulse: 93 99 99 96  Resp: 16 20 17 20   Temp: 98.8 F (37.1 C)   98.4 F (36.9 C)  TempSrc: Oral   Oral  SpO2: 98% 96% 94% 96%  Weight:      Height:        Intake/Output Summary (Last 24 hours) at 12/26/2019 1804 Last data filed at 12/26/2019 0909 Gross per 24 hour  Intake 735 ml  Output 1200 ml  Net -465 ml   Filed Weights   12/23/19 1529  Weight: 50.8 kg    Examination:  General exam: Appears calm and comfortable  Respiratory system: Clear to auscultation. Respiratory effort normal. Cardiovascular system: S1 & S2 heard, RRR. No JVD, murmurs, rubs, gallops or clicks. No pedal edema. Gastrointestinal system: Abdomen is nondistended, soft and nontender. . Normal bowel sounds heard. Central nervous system: Alert and oriented. No focal neurological deficits. Extremities: Symmetric 3-4/5 power bilaterally. Skin: No rashes,  lesions or ulcers Psychiatry: Judgement and insight appear normal. Mood & affect appropriate.    Data Reviewed:   CBC: Recent Labs  Lab 12/20/19 0608 12/23/19 1603 12/24/19 0725 12/25/19 0446 12/26/19 0503  WBC 7.6 8.2 6.5 8.2 7.9  HGB 7.3* 7.3* 7.0* 7.1* 9.5*  HCT 24.2* 25.2* 24.3* 23.5* 30.0*  MCV 58.7* 58.3* 58.3* 57.6* 63.6*  PLT 243 220 202 215 294   Basic Metabolic Panel: Recent Labs  Lab 12/20/19 0608 12/23/19 1603 12/23/19 1835 12/24/19 0725 12/25/19 0446 12/26/19 0503  NA 136 137  --  138 135 136  K 3.6 3.2*  --  4.2 4.2 4.1  CL 102 103  --  107 105 102  CO2 20* 21*  --  21* 22 25  GLUCOSE 99 114*  --  90 111* 110*  BUN 17 15  --  10 8 13   CREATININE 1.33* 1.46*  --  1.18* 1.08* 1.21*  CALCIUM 9.4 9.4  --  8.7* 8.8* 9.2  MG  --   --  1.5*  --  1.6*  --   PHOS  --   --  3.3  --   --  3.4   GFR: Estimated Creatinine Clearance: 32.2 mL/min (A) (by C-G formula based on SCr of 1.21 mg/dL (H)). Liver Function Tests: Recent Labs  Lab 12/23/19 1603 12/26/19 0503  AST 13*  --   ALT 11  --   ALKPHOS 54  --   BILITOT 0.7  --   PROT 6.8  --   ALBUMIN 4.1 3.6   No results for input(s): LIPASE, AMYLASE in the last 168 hours. No results for input(s): AMMONIA in the last 168 hours. Coagulation Profile: No results for input(s): INR, PROTIME in the last 168 hours. Cardiac Enzymes: No results for input(s): CKTOTAL, CKMB, CKMBINDEX, TROPONINI in the last 168 hours. BNP (last 3 results) No results for input(s): PROBNP in the last 8760 hours. HbA1C: No results for input(s): HGBA1C in the last 72 hours. CBG: Recent Labs  Lab 12/25/19 1652 12/25/19 2151 12/26/19 0727 12/26/19 1153 12/26/19 1654  GLUCAP 128* 102* 103* 162* 101*   Lipid Profile: No results for input(s): CHOL, HDL, LDLCALC, TRIG, CHOLHDL, LDLDIRECT in the last 72 hours. Thyroid Function Tests: No results for input(s): TSH, T4TOTAL, FREET4, T3FREE, THYROIDAB in the last 72 hours. Anemia  Panel: No results for input(s): VITAMINB12, FOLATE, FERRITIN, TIBC, IRON, RETICCTPCT in the last 72 hours. Sepsis Labs: No results for input(s): PROCALCITON, LATICACIDVEN in the last 168 hours.  Recent Results (from the past 240 hour(s))  SARS Coronavirus 2 by RT PCR (hospital order, performed in [facility] hospital lab) Nasopharyngeal Nasopharyngeal Swab     Status: None   Collection Time: 12/18/19  5:27 PM   Specimen: Nasopharyngeal Swab  Result Value Ref Range Status   SARS Coronavirus 2 NEGATIVE NEGATIVE Final    Comment: (NOTE) SARS-CoV-2 target nucleic acids are NOT DETECTED. The SARS-CoV-2 RNA is generally detectable in upper and lower respiratory specimens during  the acute phase of infection. The lowest concentration of SARS-CoV-2 viral copies this assay can detect is 250 copies / mL. A negative result does not preclude SARS-CoV-2 infection and should not be used as the sole basis for treatment or other patient management decisions.  A negative result may occur with improper specimen collection / handling, submission of specimen other than nasopharyngeal swab, presence of viral mutation(s) within the areas targeted by this assay, and inadequate number of viral copies (<250 copies / mL). A negative result must be combined with clinical observations, patient history, and epidemiological information. Fact Sheet for Patients:   StrictlyIdeas.no Fact Sheet for Healthcare Providers: BankingDealers.co.za This test is not yet approved or cleared  by the [place] FDA and has been authorized for detection and/or diagnosis of SARS-CoV-2 by FDA under an Emergency Use Authorization (EUA).  This EUA will remain in effect (meaning this test can be used) for the duration of the COVID-19 declaration under Section 564(b)(1) of the Act, 21 U.S.C. section 360bbb-3(b)(1), unless the authorization is terminated or revoked sooner. Performed at  [facility], [address] [place], [place] [address]   Urine culture     Status: Abnormal   Collection Time: 12/23/19  7:50 PM   Specimen: Urine, Clean Catch  Result Value Ref Range Status   Specimen Description   Final    URINE, CLEAN CATCH Performed at [facility], [address], [place], [place] [address]    Special Requests   Final    NONE Performed at [facility], [address] [place], [place] [address]    Culture >=100,000 COLONIES/mL ESCHERICHIA COLI (A)  Final   Report Status 12/26/2019 FINAL  Final   Organism ID, Bacteria ESCHERICHIA COLI (A)  Final      Susceptibility   Escherichia coli - MIC*    AMPICILLIN <=2 SENSITIVE Sensitive     CEFAZOLIN <=4 SENSITIVE Sensitive     CEFTRIAXONE <=1 SENSITIVE Sensitive     CIPROFLOXACIN <=0.25 SENSITIVE Sensitive     GENTAMICIN <=1 SENSITIVE Sensitive     IMIPENEM <=0.25 SENSITIVE Sensitive     NITROFURANTOIN <=16 SENSITIVE Sensitive     TRIMETH/SULFA <=20 SENSITIVE Sensitive     AMPICILLIN/SULBACTAM <=2 SENSITIVE Sensitive     PIP/TAZO <=4 SENSITIVE Sensitive     * >=100,000 COLONIES/mL ESCHERICHIA COLI  SARS Coronavirus 2 by RT PCR (hospital order, performed in [place] hospital lab) Nasopharyngeal Nasopharyngeal Swab     Status: None   Collection Time: 12/23/19  8:10 PM   Specimen: Nasopharyngeal Swab  Result Value Ref Range Status   SARS Coronavirus 2 NEGATIVE NEGATIVE Final    Comment: (NOTE) SARS-CoV-2 target nucleic acids are NOT DETECTED.  The SARS-CoV-2 RNA is generally detectable in upper and lower respiratory specimens during the acute phase of infection. The lowest concentration of SARS-CoV-2 viral copies this assay can detect is 250 copies / mL. A negative result does not preclude SARS-CoV-2 infection and should not be used as the sole basis for treatment or other patient management decisions.  A negative result may occur with improper specimen collection / handling, submission of specimen other than  nasopharyngeal swab, presence of viral mutation(s) within the areas targeted by this assay, and inadequate number of viral copies (<250 copies / mL). A negative result must be combined with clinical observations, patient history, and epidemiological information.  Fact Sheet for Patients:   StrictlyIdeas.no  Fact Sheet for Healthcare Providers: BankingDealers.co.za  This test is not yet approved or  cleared by the [place] and has been authorized for detection and/or diagnosis of SARS-CoV-2 by FDA under an Emergency Use Authorization (EUA).  This EUA will remain in effect (meaning this test can be used) for the duration of the COVID-19 declaration under Section 564(b)(1) of the Act, 21 U.S.C. section 360bbb-3(b)(1), unless the authorization is terminated or revoked sooner.  Performed at [facility], [address] [place], [place] [address]          Radiology Studies: No results found.      Scheduled Meds: . sodium chloride   Intravenous Once  . amLODipine  10 mg Oral q morning - 10a  . dorzolamide  1 drop Both Eyes BID  . feeding supplement (ENSURE ENLIVE)  237 mL Oral BID BM  . latanoprost  1 drop Both Eyes QHS  . megestrol  400 mg Oral Daily  . metoCLOPramide  5 mg Oral TID  . multivitamin with minerals  1 tablet Oral Daily  . pantoprazole  40 mg Oral Daily   Continuous Infusions:   LOS: 2 days   Roxan Hockey, MD [facility]   If 7PM-7AM, please contact night-coverage [email or web address]  12/26/2019, 6:04 PM

## 2019-12-26 NOTE — Plan of Care (Signed)

## 2019-12-27 ENCOUNTER — Inpatient Hospital Stay
Admission: RE | Admit: 2019-12-27 | Discharge: 2020-02-01 | Disposition: A | Discharge status: Skilled Nursing Facility | Payer: Medicare Other | Source: Ambulatory Visit | Attending: Internal Medicine | Admitting: Internal Medicine

## 2019-12-27 ENCOUNTER — Encounter (HOSPITAL_COMMUNITY): Payer: Self-pay | Admitting: Certified Registered Nurse Anesthetist

## 2019-12-27 ENCOUNTER — Encounter (HOSPITAL_COMMUNITY): Payer: Medicare Other

## 2019-12-27 ENCOUNTER — Other Ambulatory Visit (HOSPITAL_COMMUNITY): Payer: Medicare Other

## 2019-12-27 ENCOUNTER — Encounter (INDEPENDENT_AMBULATORY_CARE_PROVIDER_SITE_OTHER): Payer: Self-pay

## 2019-12-27 ENCOUNTER — Ambulatory Visit (HOSPITAL_COMMUNITY): Payer: Medicare Other | Admitting: Nurse Practitioner

## 2019-12-27 ENCOUNTER — Encounter (HOSPITAL_COMMUNITY)
Admission: RE | Admit: 2019-12-27 | Discharge: 2019-12-27 | Disposition: A | Discharge status: Home / Self Care | Payer: Medicare Other | Source: Ambulatory Visit | Attending: Nephrology | Admitting: Nephrology

## 2019-12-27 ENCOUNTER — Ambulatory Visit (HOSPITAL_COMMUNITY): Payer: Medicare Other

## 2019-12-27 DIAGNOSIS — Z9181 History of falling: Secondary | ICD-10-CM | POA: Diagnosis not present

## 2019-12-27 DIAGNOSIS — G111 Early-onset cerebellar ataxia, unspecified: Secondary | ICD-10-CM | POA: Diagnosis not present

## 2019-12-27 DIAGNOSIS — R270 Ataxia, unspecified: Secondary | ICD-10-CM | POA: Diagnosis not present

## 2019-12-27 DIAGNOSIS — N189 Chronic kidney disease, unspecified: Secondary | ICD-10-CM | POA: Diagnosis not present

## 2019-12-27 DIAGNOSIS — M6281 Muscle weakness (generalized): Secondary | ICD-10-CM | POA: Diagnosis not present

## 2019-12-27 DIAGNOSIS — R262 Difficulty in walking, not elsewhere classified: Secondary | ICD-10-CM | POA: Diagnosis not present

## 2019-12-27 DIAGNOSIS — R911 Solitary pulmonary nodule: Secondary | ICD-10-CM | POA: Diagnosis not present

## 2019-12-27 DIAGNOSIS — I129 Hypertensive chronic kidney disease with stage 1 through stage 4 chronic kidney disease, or unspecified chronic kidney disease: Secondary | ICD-10-CM | POA: Diagnosis not present

## 2019-12-27 DIAGNOSIS — R627 Adult failure to thrive: Secondary | ICD-10-CM | POA: Diagnosis not present

## 2019-12-27 DIAGNOSIS — M810 Age-related osteoporosis without current pathological fracture: Secondary | ICD-10-CM | POA: Diagnosis not present

## 2019-12-27 DIAGNOSIS — T7849XS Other allergy, sequela: Secondary | ICD-10-CM | POA: Diagnosis not present

## 2019-12-27 DIAGNOSIS — D631 Anemia in chronic kidney disease: Secondary | ICD-10-CM | POA: Diagnosis not present

## 2019-12-27 DIAGNOSIS — D510 Vitamin B12 deficiency anemia due to intrinsic factor deficiency: Secondary | ICD-10-CM | POA: Diagnosis not present

## 2019-12-27 DIAGNOSIS — R112 Nausea with vomiting, unspecified: Secondary | ICD-10-CM | POA: Diagnosis not present

## 2019-12-27 DIAGNOSIS — N1832 Chronic kidney disease, stage 3b: Secondary | ICD-10-CM | POA: Diagnosis not present

## 2019-12-27 DIAGNOSIS — R1310 Dysphagia, unspecified: Secondary | ICD-10-CM | POA: Diagnosis not present

## 2019-12-27 DIAGNOSIS — K2970 Gastritis, unspecified, without bleeding: Secondary | ICD-10-CM | POA: Diagnosis not present

## 2019-12-27 DIAGNOSIS — R000 Tachycardia, unspecified: Secondary | ICD-10-CM | POA: Diagnosis not present

## 2019-12-27 DIAGNOSIS — E860 Dehydration: Secondary | ICD-10-CM | POA: Diagnosis not present

## 2019-12-27 DIAGNOSIS — E1122 Type 2 diabetes mellitus with diabetic chronic kidney disease: Secondary | ICD-10-CM | POA: Diagnosis not present

## 2019-12-27 DIAGNOSIS — E1129 Type 2 diabetes mellitus with other diabetic kidney complication: Secondary | ICD-10-CM | POA: Diagnosis not present

## 2019-12-27 DIAGNOSIS — E876 Hypokalemia: Secondary | ICD-10-CM | POA: Diagnosis not present

## 2019-12-27 DIAGNOSIS — R279 Unspecified lack of coordination: Secondary | ICD-10-CM | POA: Diagnosis not present

## 2019-12-27 DIAGNOSIS — D509 Iron deficiency anemia, unspecified: Secondary | ICD-10-CM | POA: Diagnosis not present

## 2019-12-27 DIAGNOSIS — I1310 Hypertensive heart and chronic kidney disease without heart failure, with stage 1 through stage 4 chronic kidney disease, or unspecified chronic kidney disease: Secondary | ICD-10-CM | POA: Diagnosis not present

## 2019-12-27 DIAGNOSIS — E069 Thyroiditis, unspecified: Secondary | ICD-10-CM | POA: Diagnosis not present

## 2019-12-27 DIAGNOSIS — E538 Deficiency of other specified B group vitamins: Secondary | ICD-10-CM | POA: Diagnosis not present

## 2019-12-27 DIAGNOSIS — J449 Chronic obstructive pulmonary disease, unspecified: Secondary | ICD-10-CM | POA: Diagnosis not present

## 2019-12-27 DIAGNOSIS — G6289 Other specified polyneuropathies: Secondary | ICD-10-CM | POA: Diagnosis not present

## 2019-12-27 DIAGNOSIS — H4010X0 Unspecified open-angle glaucoma, stage unspecified: Secondary | ICD-10-CM | POA: Diagnosis not present

## 2019-12-27 DIAGNOSIS — E1121 Type 2 diabetes mellitus with diabetic nephropathy: Secondary | ICD-10-CM | POA: Diagnosis not present

## 2019-12-27 DIAGNOSIS — E785 Hyperlipidemia, unspecified: Secondary | ICD-10-CM | POA: Diagnosis not present

## 2019-12-27 DIAGNOSIS — N183 Chronic kidney disease, stage 3 unspecified: Secondary | ICD-10-CM | POA: Diagnosis not present

## 2019-12-27 DIAGNOSIS — N390 Urinary tract infection, site not specified: Secondary | ICD-10-CM | POA: Diagnosis not present

## 2019-12-27 DIAGNOSIS — B9620 Unspecified Escherichia coli [E. coli] as the cause of diseases classified elsewhere: Secondary | ICD-10-CM | POA: Diagnosis not present

## 2019-12-27 DIAGNOSIS — K2950 Unspecified chronic gastritis without bleeding: Secondary | ICD-10-CM | POA: Diagnosis not present

## 2019-12-27 LAB — GLUCOSE, CAPILLARY
Glucose-Capillary: 109 mg/dL — ABNORMAL HIGH (ref 70–99)
Glucose-Capillary: 126 mg/dL — ABNORMAL HIGH (ref 70–99)
Glucose-Capillary: 123 mg/dL — ABNORMAL HIGH (ref 70–99)

## 2019-12-27 MED ORDER — AMLODIPINE BESYLATE 10 MG PO TABS: 10.0000 mg | ORAL_TABLET | Freq: Every day | ORAL | 1 refills | Status: DC

## 2019-12-27 MED ORDER — METOCLOPRAMIDE HCL 5 MG PO TABS
5.0000 mg | ORAL_TABLET | Freq: Three times a day (TID) | ORAL | 1 refills | Status: DC
Start: 2019-12-27 — End: 2023-05-17

## 2019-12-27 MED ORDER — MEGESTROL ACETATE 400 MG/10ML PO SUSP: 400.0000 mg | Freq: Every day | ORAL | 0 refills | Status: DC

## 2019-12-27 MED ORDER — EPOETIN ALFA 3000 UNIT/ML IJ SOLN: 3000.0000 [IU] | INTRAMUSCULAR | 11 refills | Status: DC

## 2019-12-27 MED ORDER — ACETAMINOPHEN 325 MG PO TABS: 650.0000 mg | ORAL_TABLET | Freq: Four times a day (QID) | ORAL | 0 refills | Status: DC | PRN

## 2019-12-27 MED ORDER — ENSURE ACTIVE HIGH PROTEIN PO LIQD: 1.0000 | Freq: Two times a day (BID) | ORAL | 2 refills | Status: DC

## 2019-12-27 NOTE — Discharge Instructions (Signed)
1)Avoid ibuprofen/Advil/Aleve/Motrin/Goody Powders/Naproxen/BC powders/Meloxicam/Diclofenac/Indomethacin and other Nonsteroidal anti-inflammatory medications as these will make you more likely to bleed and can cause stomach ulcers, can also cause Kidney problems.   2)You need endoscopic ultrasound/EUS endoscopy with Dr. Owens Loffler at [facility]--scheduled for Thursday 01/04/20 at 9:15 AM  3) please follow-up with hematologist Dr. Delton Coombes for ongoing management of your anemia  4) you will need Procrit injections 3,000 units once a week for your anemia that is related to chronic kidney disease  5) you will need monthly B12 injections--for your chronic anemia  6) you will need repeat CBC and BMP test on Monday, 01/01/2020

## 2019-12-27 NOTE — Care Management Important Message (Signed)
Important Message  Patient Details  Name: Traci Parker MRN: [id number] Date of Birth: 1945-09-11   Medicare Important Message Given:  Yes     Tommy Medal 12/27/2019, 10:08 AM

## 2019-12-27 NOTE — Telephone Encounter (Signed)
Called Dr Denton Brick he states he is currently waiting for authorization to transfer to SNF.  Per Dr Ardis Hughs we discussed having the pt remain hospitalized until after procedure on Thursday and transporting from [facility] via [facility] 2 hours prior to the case at [facility].   Waiting a response.

## 2019-12-27 NOTE — Telephone Encounter (Signed)
I didn't know she was in the hospital.  If she is not well enough to go home from hospital than she probably needs another few weeks of recovery.  I sent a staff note about her earlier but instead of simply rebooking her EUS case however  I think it is best that she be seen in our office first. Can you please set up OV with myself or extender in the next 3-4 weeks, will see how she is doing/recovering and rebook if appropriate.   Bennett Scrape

## 2019-12-27 NOTE — Telephone Encounter (Signed)
Dr Ardis Hughs the pt has been moved to next Thursday.  The pt is going to SNF today.     The pt has been moved to 01/04/20 at 915 am at [facility] with Dr Ardis Hughs. I will call pt later today or tomorrow with new instructions.

## 2019-12-27 NOTE — Telephone Encounter (Signed)
This is a very last-minute notice to try to orchestrate a skilled nursing facility to [facility] long hospital outpatient procedure especially if the patient is not even at that skilled nursing facility yet.  We are going to call the hospitalist on the phone and consider options.  She could stay at [facility] another night or 2 and have the procedure done tomorrow and then go back to [name] for eventual discharge to that skilled nursing facility.  Alternatively she could go to the skilled nursing facility and we could try to orchestrate this case to be done next Thursday instead.

## 2019-12-27 NOTE — Progress Notes (Signed)
IV removed and report called to [facility] at [facility]

## 2019-12-27 NOTE — Telephone Encounter (Signed)
please let Dr Ardis Hughs know that pt is stable for EUS-----plan is to discharge her to SNF rehab tomorrow---she can still have EUS as outpatient even if she is at [facility]----Thank u  this was sent by Dr Denton Brick

## 2019-12-27 NOTE — Telephone Encounter (Signed)
I have faxed the appt information to the SNF Attn Amanda at [phone]

## 2019-12-27 NOTE — Discharge Summary (Signed)
Traci Parker, is a 74 y.o. female  DOB 1946/07/03  MRN [id number].  Admission date:  12/23/2019  Admitting Physician  Kathie Dike, MD  Discharge Date:  12/27/2019   Primary MD  Doree Albee, MD  Recommendations for primary care physician for things to follow:   1)Avoid ibuprofen/Advil/Aleve/Motrin/Goody Powders/Naproxen/BC powders/Meloxicam/Diclofenac/Indomethacin and other Nonsteroidal anti-inflammatory medications as these will make you more likely to bleed and can cause stomach ulcers, can also cause Kidney problems.   2)You need endoscopic ultrasound/EUS endoscopy with Dr. Owens Loffler at [facility] gastroenterology--scheduled for Thursday 01/04/20 at 9:15 AM  3) please follow-up with hematologist Dr. Delton Coombes for ongoing management of your anemia  4) you will need Procrit injections 3,000 units once a week for your anemia that is related to chronic kidney disease  5) you will need monthly B12 injections--for your chronic anemia  6) you will need repeat CBC and BMP test on Monday, 01/01/2020  Admission Diagnosis  Failure to thrive in adult [R62.7]  Discharge Diagnosis  Failure to thrive in adult [R62.7]   Principal Problem:   Failure to thrive in adult Active Problems:   Hyperlipidemia   Open-angle glaucoma   Microcytic anemia   Hypertension   COPD (chronic obstructive pulmonary disease) ([place])   Type 2 diabetes mellitus ([place])   Hypokalemia   Hypomagnesemia   Fall   Lung nodule < 6cm on CT      Past Medical History:  Diagnosis Date  . Allergy   . Anemia    pernicious anemia  . Asthma   . Blood transfusion without reported diagnosis   . COPD (chronic obstructive pulmonary disease) ([place]) 12/23/2019  . Glaucoma   . History of blood in urine   . Hypertension   . Osteoporosis   . Type 2 diabetes mellitus ([place]) 12/23/2019    Past Surgical History:  Procedure Laterality Date    . CHOLECYSTECTOMY  2008  . COLONOSCOPY  04/30/2011   Procedure: COLONOSCOPY;  Surgeon: Rogene Houston, MD;  [facility];  Service: Endoscopy;  Laterality: N/A;  . COLONOSCOPY  05/25/2012   Procedure: COLONOSCOPY;  Surgeon: Rogene Houston, MD;  [facility];  Service: Endoscopy;  Laterality: N/A;  1:25-changed to 1200 Ann to notify pt  . COLONOSCOPY Bilateral 12/2017  . ESOPHAGOGASTRODUODENOSCOPY N/A 12/07/2019   Procedure: ESOPHAGOGASTRODUODENOSCOPY (EGD);  Surgeon: Rogene Houston, MD;  [facility];  Service: Endoscopy;  Laterality: N/A;  155  . GALLBLADDER SURGERY    . right breast cystectomy  1988  . TUBAL LIGATION  1975     HPI  from the history and physical done on the day of admission:    Chief Complaint: Weakness and shortness of breath.  HPI: Traci Parker is a 74 y.o. female with medical history significant of seasonal allergies, pernicious anemia, asthma/COPD, history of blood transfusion, glaucoma, history of hematuria, hypertension, osteoporosis, type 2 diabetes who is coming back to the hospital after being discharged 2 days ago due to generalized weakness  and having falls at home.  Her son and other family members are concerned because she lives at home alone.  She denies fever, but complains of chills and fatigue.  No rhinorrhea, sore throat, wheezing or hemoptysis, but has been feeling short of breath on exertion.  She denies chest pain, palpitations, dizziness, diaphoresis, PND, orthopnea or recent pitting edema of the lower extremities.  She denies abdominal pain, diarrhea, constipation, melena or hematochezia.  No dysuria, frequency or materia.  No polyuria, polydipsia, polyphagia or blurred vision.  ED Course: Initial vital signs were temperature 98.4 F, pulse 116, RR 18, 135/75 mmHg and 96%. The patient received a 1000 mL NS bolus in the emergency department. She received oral potassium and 2 g of magnesium sulfate IVPB.  Urinalysis  had a cloudy appearance with moderate hemoglobinuria, ketonuria 5 mg/dL and large leukocyte esterase.  Microscopic urine exam shows 21-50 RBCs, WBC more than 50 and many bacteria on examination.  White count was 8.2, hemoglobin 7.3 g/dL and platelets 228.  D-dimer is 0.86.  Troponin was 29 and then 31 ng/L.  CMP shows a potassium of 3.2 and CO2 of 21 mmol/L.  All other electrolytes are within normal limits.  LFTs are unremarkable.  Magnesium is 1.5, phosphorus 3.3, calcium 9.4, glucose 114, BUN 15 and creatinine 1.46 mg/dL.  Review of Systems: As per HPI otherwise all other systems reviewed and are negative.   Hospital Course:     Brief Narrative:  74 year old female with a history of COPD, anemia, hypertension, who was discharged from the hospital recently after being treated for nausea and vomiting.  She was discharged home where she lives alone.  She continued to have multiple falls and was brought back to the hospital for failure to thrive and unsafe home environment.  She is being evaluated by physical therapy and may need skilled nursing facility placement. -Transfused 1 unit of PRBC on 12/25/19 after discussions with her hematologist Dr. Delton Coombes    Assessment & Plan:   Principal Problem:   Failure to thrive in adult Active Problems:   Hyperlipidemia   Open-angle glaucoma   Microcytic anemia   Hypertension   COPD (chronic obstructive pulmonary disease) ([facility])   Type 2 diabetes mellitus ([facility])   Hypokalemia   Hypomagnesemia   Fall   Lung nodule < 6cm on CT   1) symptomatic anemia-----patient with acute on chronic anemia--- multifactorial anemia with underlying B12 deficiency currently appropriately replaced with B12 shots, suspected thalassemia minor given degree of microcytosis, as well as chronic anemia of CKD--- PTA was on Procrit/ESA agent by nephrologist Dr. Theador Hawthorne --Transfused 1 unit of PRBC on 12/25/19 after discussions with her hematologist Dr. Delton Coombes -hgb is up  to 9.5 -Repeat CBC on Monday, 01/01/2020 -Continue B12 shots monthly, -Procrit-to be changed to 3000 units/week rather than every 2 weeks  2)Failure to thrive/generalized weakness.  Patient was living at home alone and was having multiple falls.  Physical therapy eval appreciated, recommends SNF rehab  3)Microcytic anemia.  Recent anemia panel did not indicate iron deficiency.  --- Please see #1 above -Had EGD and colonoscopy previously -Last EGD 12/07/2019 plans for repeat EGD on 12/28/2019 ---endoscopic ultrasound/EUS endoscopy with Dr. Owens Loffler at [facility] gastroenterology--scheduled for Thursday 01/04/20 at 9:15 AM  4)incidental finding of lung nodule on CT.  Will need further surveillance as an outpatient.  5)HTN--stable, continue Norvasc   6)COPD--stable without acute exacerbation at this time  7)FEN--nausea, emesis, weight loss and anorexia--- work-up for malignancy so far negative -  Continue Megace for appetite stimulation -EUS as above in #3   Code Status: Full code Family Communication: Discussed with patient's son and sister  Disposition Plan:  Discharge to SNF rehab  Dispo: The patient is from: Home  Anticipated d/c is to: SNF   Consultants:   Discussed with Dr Gilda Crease  -Discussed with gastroenterologist Dr. Owens Loffler  Procedures:   PRBCs--- 12/25/19  Antimicrobials:      Discharge Condition: Stable,  Follow UP   Contact information for after-discharge care    [facility] .   Service: Skilled Nursing Contact information: [address] [place] [address] [phone]                 Diet and Activity recommendation:  As advised  Discharge Instructions    Discharge Instructions    Call MD for:  difficulty breathing, headache or visual disturbances   Complete by: As directed    Call MD for:  extreme fatigue   Complete by: As directed     Call MD for:  persistant dizziness or light-headedness   Complete by: As directed    Call MD for:  persistant nausea and vomiting   Complete by: As directed    Call MD for:  severe uncontrolled pain   Complete by: As directed    Call MD for:  temperature >100.4   Complete by: As directed    Diet general   Complete by: As directed    Discharge instructions   Complete by: As directed    1)Avoid ibuprofen/Advil/Aleve/Motrin/Goody Powders/Naproxen/BC powders/Meloxicam/Diclofenac/Indomethacin and other Nonsteroidal anti-inflammatory medications as these will make you more likely to bleed and can cause stomach ulcers, can also cause Kidney problems.   2)You need endoscopic ultrasound/EUS endoscopy with Dr. Owens Loffler at [facility] gastroenterology--scheduled for Thursday 01/04/20 at 9:15 AM  3) please follow-up with hematologist Dr. Delton Coombes for ongoing management of your anemia  4) you will need Procrit injections 3,000 units once a week for your anemia that is related to chronic kidney disease  5) you will need monthly B12 injections--for your chronic anemia  6) you will need repeat CBC and BMP test on Monday, 01/01/2020   Increase activity slowly   Complete by: As directed         Discharge Medications     Allergies as of 12/27/2019      Reactions   Latex Rash   Levofloxacin Nausea And Vomiting   Neomycin-bacitracin-polymyxin  [bacitracin-neomycin-polymyxin] Rash   Other Rash, Swelling   Prednisone Hives, Itching, Rash, Other (See Comments)   Fish Allergy    rash   Neosporin [neomycin-polymyxin-gramicidin] Itching, Rash      Medication List    STOP taking these medications   promethazine 12.5 MG tablet Commonly known as: PHENERGAN     TAKE these medications   acetaminophen 325 MG tablet Commonly known as: TYLENOL Take 2 tablets (650 mg total) by mouth every 6 (six) hours as needed for mild pain (or Fever >/= 101).   albuterol 108 (90 Base) MCG/ACT inhaler Commonly  known as: VENTOLIN HFA Inhale 2 puffs into the lungs every 4 (four) hours as needed for wheezing or shortness of breath.   amLODipine 10 MG tablet Commonly known as: NORVASC Take 1 tablet (10 mg total) by mouth daily. What changed: medication strength   cyanocobalamin 1000 MCG/ML injection Commonly known as: (VITAMIN B-12) Inject 1 mL (1,000 mcg total) into the muscle every 30 (thirty) days.  dorzolamide 2 % ophthalmic solution Commonly known as: TRUSOPT Place 1 drop into both eyes 2 (two) times daily.   Ensure Active High Protein Liqd Take 1 Bottle by mouth 2 (two) times daily at 10 AM and 5 PM.   epoetin alfa 3000 UNIT/ML injection Commonly known as: EPOGEN Inject 1 mL (3,000 Units total) into the vein once a week. What changed: when to take this   folic acid 1 MG tablet Commonly known as: FOLVITE Take 1 mg by mouth daily.   latanoprost 0.005 % ophthalmic solution Commonly known as: XALATAN Place 1 drop into both eyes at bedtime.   megestrol 400 MG/10ML suspension Commonly known as: MEGACE Take 10 mLs (400 mg total) by mouth daily. Start taking on: December 28, 2019   metoCLOPramide 5 MG tablet Commonly known as: Reglan Take 1 tablet (5 mg total) by mouth 3 (three) times daily before meals. What changed: when to take this   omeprazole 40 MG capsule Commonly known as: PRILOSEC Take 40 mg by mouth daily.   ondansetron 4 MG disintegrating tablet Commonly known as: Zofran ODT Take 1 tablet (4 mg total) by mouth every 8 (eight) hours as needed for nausea or vomiting. Dissolve under tongue      Major procedures and Radiology Reports - PLEASE review detailed and final reports for all details, in brief -   CT ABDOMEN PELVIS WO CONTRAST  Addendum Date: 12/15/2019   ADDENDUM REPORT: 12/15/2019 17:13 ADDENDUM: Aortic dilation maximal caliber 2.7 cm of the infrarenal abdominal aorta. Ectatic abdominal aorta at risk for aneurysm development. Recommend followup by ultrasound  in 5 years. This recommendation follows ACR consensus guidelines: White Paper of the ACR Incidental Findings Committee II on Vascular Findings. J Am Coll Radiol 2013; 10:789-794. These results will be called to the ordering clinician or representative by the Radiologist Assistant, and communication documented in the PACS or [employer]. Electronically Signed   By: Zetta Bills M.D.   On: 12/15/2019 17:13   Result Date: 12/15/2019 CLINICAL DATA:  Nausea vomiting and unintended weight loss. EXAM: CT ABDOMEN AND PELVIS WITHOUT CONTRAST TECHNIQUE: Multidetector CT imaging of the abdomen and pelvis was performed following the standard protocol without IV contrast. COMPARISON:  Nausea vomiting and under a tended weight loss FINDINGS: Lower chest: Incidental imaging of the lung bases is unremarkable. Hepatobiliary: Liver normal size and contour. Small low-density focus in the posterior RIGHT hepatic lobe measures near water density likely a small cyst. Post cholecystectomy. No gross biliary ductal dilation. Pancreas: No peripancreatic inflammation or gross ductal dilation. Spleen: Spleen normal size and contour. Adrenals/Urinary Tract: Adrenal glands are normal. Bilateral nephrolithiasis. Two on the RIGHT measuring between 2 and 3 mm. A single calculus approximately 2-3 mm in the LEFT upper pole. Low-density lesion arising from lower pole of the LEFT kidney measures water density likely a small cyst. Urinary bladder is unremarkable. No sign of ureteral calculus or hydronephrosis. Stomach/Bowel: Stomach under distended limiting assessment. Low-density generalized within the submucosa of the stomach. No acute small bowel process. No pericolonic stranding. Appendix not visualized but without pericecal inflammation. Vascular/Lymphatic: Calcified atheromatous plaque in the abdominal aorta. Max caliber 2.7 x 2.7 cm (image 29 of series 2) no adenopathy in the retroperitoneum or in the upper abdomen. No pelvic  lymphadenopathy. Reproductive: Uterus and adnexa are unremarkable by CT. Other: No free air.  No ascites. Musculoskeletal: Spinal degenerative changes. No acute or destructive bone process. IMPRESSION: 1. Bilateral nephrolithiasis without sign of ureteral calculus or hydronephrosis. 2.  Low-density generalized within the submucosa of the stomach. This is likely related to known gastritis. 3. Aortic atherosclerosis. Aortic Atherosclerosis (ICD10-I70.0). Electronically Signed: By: Zetta Bills M.D. On: 12/15/2019 12:52   CT HEAD WO CONTRAST  Result Date: 12/19/2019 CLINICAL DATA:  Nausea, vomiting, weight loss EXAM: CT HEAD WITHOUT CONTRAST TECHNIQUE: Contiguous axial images were obtained from the base of the skull through the vertex without intravenous contrast. COMPARISON:  None. FINDINGS: Brain: No evidence of acute infarction, hemorrhage, extra-axial collection, ventriculomegaly, or mass effect. Generalized cerebral atrophy. Periventricular white matter low attenuation likely secondary to microangiopathy. Vascular: Cerebrovascular atherosclerotic calcifications are noted. Skull: Negative for fracture or focal lesion. Sinuses/Orbits: Visualized portions of the orbits are unremarkable. Visualized portions of the paranasal sinuses are unremarkable. Visualized portions of the mastoid air cells are unremarkable. Other: None. IMPRESSION: 1. No acute intracranial pathology. 2. Chronic microvascular disease and cerebral atrophy. Electronically Signed   By: Kathreen Devoid   On: 12/19/2019 09:25   CT ANGIO CHEST PE W OR WO CONTRAST  Result Date: 12/23/2019 CLINICAL DATA:  Dyspnea on exertion, elevated D-dimer and shortness of breath for 2 weeks. EXAM: CT ANGIOGRAPHY CHEST WITH CONTRAST TECHNIQUE: Multidetector CT imaging of the chest was performed using the standard protocol during bolus administration of intravenous contrast. Multiplanar CT image reconstructions and MIPs were obtained to evaluate the vascular  anatomy. CONTRAST:  77mL OMNIPAQUE IOHEXOL 350 MG/ML SOLN COMPARISON:  Abdomen and pelvis CT December 15, 2019 FINDINGS: Cardiovascular: Calcific and noncalcific atheromatous plaque in the thoracic aorta. Moderate and with 3.6 cm caliber of ascending thoracic aorta. Heart size is normal without pericardial effusion. Opacification of pulmonary vasculature without sign of pulmonary embolism. Mediastinum/Nodes: Thoracic inlet structures are normal. No axillary lymphadenopathy. No mediastinal lymphadenopathy. No hilar lymphadenopathy. Esophagus is mildly patulous. Lungs/Pleura: Paraseptal emphysema, mild and worse at the lung apices. No consolidation. No pleural effusion. Small nodule along the fissure in the RIGHT lower lobe (image 78, series 8) 3 mm. No consolidation. No pleural effusion. Upper Abdomen: Incidental imaging of upper abdominal contents without acute upper abdominal process. Limited visualization of viscera in the upper abdomen. Musculoskeletal: No acute musculoskeletal process. Review of the MIP images confirms the above findings. IMPRESSION: 1. No evidence of pulmonary embolism. 2. Paraseptal emphysema, mild and worse at the lung apices. 3. 3 mm nodule along the fissure in the RIGHT lower lobe. No follow-up needed if patient is low-risk. Non-contrast chest CT can be considered in 12 months if patient is high-risk. This recommendation follows the consensus statement: Guidelines for Management of Incidental Pulmonary Nodules Detected on CT Images: From the Fleischner Society 2017; Radiology 2017; 284:228-243. 4. Moderate and with 3.6 cm caliber of ascending thoracic aorta. Recommend annual imaging followup by CTA or MRA. This recommendation follows 2010 ACCF/AHA/AATS/ACR/ASA/SCA/SCAI/SIR/STS/SVM Guidelines for the Diagnosis and Management of Patients with Thoracic Aortic Disease. Circulation.2010; 121: M578-I696. Aortic aneurysm NOS (ICD10-I71.9) 5. Emphysema and aortic atherosclerosis. Aortic Atherosclerosis  (ICD10-I70.0) and Emphysema (ICD10-J43.9). Electronically Signed   By: Zetta Bills M.D.   On: 12/23/2019 18:47   DG Chest Port 1 View  Result Date: 12/23/2019 CLINICAL DATA:  Shortness of breath EXAM: PORTABLE CHEST 1 VIEW COMPARISON:  12/18/2019 FINDINGS: The heart size and mediastinal contours are within normal limits. Both lungs are clear. The visualized skeletal structures are unremarkable. IMPRESSION: No active disease. Electronically Signed   By: Inez Catalina M.D.   On: 12/23/2019 16:23   DG Chest Port 1 View  Result Date: 12/18/2019 CLINICAL DATA:  Vomiting  for the past few days. EXAM: PORTABLE CHEST 1 VIEW COMPARISON:  Chest x-ray dated Nov 29, 2019. FINDINGS: The heart size and mediastinal contours are within normal limits. Both lungs are clear. The visualized skeletal structures are unremarkable. IMPRESSION: No active disease. Electronically Signed   By: Titus Dubin M.D.   On: 12/18/2019 14:58   DG Chest Port 1 View  Result Date: 11/29/2019 CLINICAL DATA:  Shortness of breath EXAM: PORTABLE CHEST 1 VIEW COMPARISON:  2008, no recent imaging FINDINGS: No consolidation or edema. No pleural effusion or pneumothorax. Question of mild background changes of emphysema. Cardiomediastinal contours are within normal limits with normal heart size. IMPRESSION: No acute process in the chest. Question of mild background changes of emphysema. Electronically Signed   By: Macy Mis M.D.   On: 11/29/2019 13:59    Micro Results   Recent Results (from the past 240 hour(s))  SARS Coronavirus 2 by RT PCR (hospital order, performed in [facility] hospital lab) Nasopharyngeal Nasopharyngeal Swab     Status: None   Collection Time: 12/18/19  5:27 PM   Specimen: Nasopharyngeal Swab  Result Value Ref Range Status   SARS Coronavirus 2 NEGATIVE NEGATIVE Final    Comment: (NOTE) SARS-CoV-2 target nucleic acids are NOT DETECTED. The SARS-CoV-2 RNA is generally detectable in upper and lower respiratory  specimens during the acute phase of infection. The lowest concentration of SARS-CoV-2 viral copies this assay can detect is 250 copies / mL. A negative result does not preclude SARS-CoV-2 infection and should not be used as the sole basis for treatment or other patient management decisions.  A negative result may occur with improper specimen collection / handling, submission of specimen other than nasopharyngeal swab, presence of viral mutation(s) within the areas targeted by this assay, and inadequate number of viral copies (<250 copies / mL). A negative result must be combined with clinical observations, patient history, and epidemiological information. Fact Sheet for Patients:   StrictlyIdeas.no Fact Sheet for Healthcare Providers: BankingDealers.co.za This test is not yet approved or cleared  by the [place] FDA and has been authorized for detection and/or diagnosis of SARS-CoV-2 by FDA under an Emergency Use Authorization (EUA).  This EUA will remain in effect (meaning this test can be used) for the duration of the COVID-19 declaration under Section 564(b)(1) of the Act, 21 U.S.C. section 360bbb-3(b)(1), unless the authorization is terminated or revoked sooner. Performed at [facility], [address] [place], [place] [address]   Urine culture     Status: Abnormal   Collection Time: 12/23/19  7:50 PM   Specimen: Urine, Clean Catch  Result Value Ref Range Status   Specimen Description   Final    URINE, CLEAN CATCH Performed at [facility], [address] [place], [place] [address]    Special Requests   Final    NONE Performed at [facility], [address], [place], [place] [address]    Culture >=100,000 COLONIES/mL ESCHERICHIA COLI (A)  Final   Report Status 12/26/2019 FINAL  Final   Organism ID, Bacteria ESCHERICHIA COLI (A)  Final      Susceptibility   Escherichia coli - MIC*    AMPICILLIN <=2 SENSITIVE Sensitive      CEFAZOLIN <=4 SENSITIVE Sensitive     CEFTRIAXONE <=1 SENSITIVE Sensitive     CIPROFLOXACIN <=0.25 SENSITIVE Sensitive     GENTAMICIN <=1 SENSITIVE Sensitive     IMIPENEM <=0.25 SENSITIVE Sensitive     NITROFURANTOIN <=16 SENSITIVE Sensitive  TRIMETH/SULFA <=20 SENSITIVE Sensitive     AMPICILLIN/SULBACTAM <=2 SENSITIVE Sensitive     PIP/TAZO <=4 SENSITIVE Sensitive     * >=100,000 COLONIES/mL ESCHERICHIA COLI  SARS Coronavirus 2 by RT PCR (hospital order, performed in [facility] hospital lab) Nasopharyngeal Nasopharyngeal Swab     Status: None   Collection Time: 12/23/19  8:10 PM   Specimen: Nasopharyngeal Swab  Result Value Ref Range Status   SARS Coronavirus 2 NEGATIVE NEGATIVE Final    Comment: (NOTE) SARS-CoV-2 target nucleic acids are NOT DETECTED.  The SARS-CoV-2 RNA is generally detectable in upper and lower respiratory specimens during the acute phase of infection. The lowest concentration of SARS-CoV-2 viral copies this assay can detect is 250 copies / mL. A negative result does not preclude SARS-CoV-2 infection and should not be used as the sole basis for treatment or other patient management decisions.  A negative result may occur with improper specimen collection / handling, submission of specimen other than nasopharyngeal swab, presence of viral mutation(s) within the areas targeted by this assay, and inadequate number of viral copies (<250 copies / mL). A negative result must be combined with clinical observations, patient history, and epidemiological information.  Fact Sheet for Patients:   StrictlyIdeas.no  Fact Sheet for Healthcare Providers: BankingDealers.co.za  This test is not yet approved or  cleared by the [place] FDA and has been authorized for detection and/or diagnosis of SARS-CoV-2 by FDA under an Emergency Use Authorization (EUA).  This EUA will remain in effect (meaning this test can be used)  for the duration of the COVID-19 declaration under Section 564(b)(1) of the Act, 21 U.S.C. section 360bbb-3(b)(1), unless the authorization is terminated or revoked sooner.  Performed at [facility], [address], [place], [place] [address]        Today   Subjective    Traci Parker today has no new complaints No fever  Or chills  Nausea has improved, no emesis -Appetite is not great         Patient has been seen and examined prior to discharge   Objective   Blood pressure 128/74, pulse 99, temperature 98.5 F (36.9 C), temperature source Oral, resp. rate 18, height 5\' 4"  (1.626 m), weight 50.8 kg, SpO2 96 %.   Intake/Output Summary (Last 24 hours) at 12/27/2019 1336 Last data filed at 12/27/2019 1300 Gross per 24 hour  Intake 1070 ml  Output --  Net 1070 ml    Exam Gen:- Awake Alert, no acute distress  HEENT:- [place].AT, No sclera icterus Neck-Supple Neck,No JVD,.  Lungs-  CTAB , good air movement bilaterally  CV- S1, S2 normal, regular Abd-  +ve B.Sounds, Abd Soft, No tenderness,    Extremity/Skin:- No  edema,   good pulses Psych-affect is appropriate, oriented x3 Neuro-generalized weakness, no new focal deficits, no tremors    Data Review   CBC w Diff:  Lab Results  Component Value Date   WBC 7.9 12/26/2019   HGB 9.5 (L) 12/26/2019   HCT 30.0 (L) 12/26/2019   PLT 205 12/26/2019   LYMPHOPCT 10 12/18/2019   MONOPCT 4 12/18/2019   EOSPCT 0 12/18/2019   BASOPCT 0 12/18/2019    CMP:  Lab Results  Component Value Date   NA 136 12/26/2019   K 4.1 12/26/2019   CL 102 12/26/2019   CO2 25 12/26/2019   BUN 13 12/26/2019   CREATININE 1.21 (H) 12/26/2019   CREATININE 1.38 (H) 03/24/2019   PROT 6.8 12/23/2019  ALBUMIN 3.6 12/26/2019   BILITOT 0.7 12/23/2019   ALKPHOS 54 12/23/2019   AST 13 (L) 12/23/2019   ALT 11 12/23/2019  .   Total Discharge time is about 33 minutes  Roxan Hockey M.D on 12/27/2019 at 1:36 PM  Go to [email or web address] -  for  contact info  [facility] - Office  [phone]

## 2019-12-28 ENCOUNTER — Encounter: Payer: Self-pay | Admitting: Adult Health

## 2019-12-28 ENCOUNTER — Ambulatory Visit (INDEPENDENT_AMBULATORY_CARE_PROVIDER_SITE_OTHER): Payer: Medicare Other | Admitting: Nurse Practitioner

## 2019-12-28 ENCOUNTER — Non-Acute Institutional Stay (SKILLED_NURSING_FACILITY): Payer: Medicare Other | Admitting: Adult Health

## 2019-12-28 DIAGNOSIS — K2950 Unspecified chronic gastritis without bleeding: Secondary | ICD-10-CM | POA: Diagnosis not present

## 2019-12-28 DIAGNOSIS — N183 Chronic kidney disease, stage 3 unspecified: Secondary | ICD-10-CM

## 2019-12-28 DIAGNOSIS — N390 Urinary tract infection, site not specified: Secondary | ICD-10-CM

## 2019-12-28 DIAGNOSIS — E538 Deficiency of other specified B group vitamins: Secondary | ICD-10-CM

## 2019-12-28 DIAGNOSIS — E1122 Type 2 diabetes mellitus with diabetic chronic kidney disease: Secondary | ICD-10-CM | POA: Diagnosis not present

## 2019-12-28 DIAGNOSIS — J449 Chronic obstructive pulmonary disease, unspecified: Secondary | ICD-10-CM

## 2019-12-28 DIAGNOSIS — D631 Anemia in chronic kidney disease: Secondary | ICD-10-CM

## 2019-12-28 DIAGNOSIS — B9620 Unspecified Escherichia coli [E. coli] as the cause of diseases classified elsewhere: Secondary | ICD-10-CM

## 2019-12-28 DIAGNOSIS — E1121 Type 2 diabetes mellitus with diabetic nephropathy: Secondary | ICD-10-CM | POA: Diagnosis not present

## 2019-12-28 DIAGNOSIS — H40053 Ocular hypertension, bilateral: Secondary | ICD-10-CM

## 2019-12-28 DIAGNOSIS — N1832 Chronic kidney disease, stage 3b: Secondary | ICD-10-CM

## 2019-12-28 DIAGNOSIS — I129 Hypertensive chronic kidney disease with stage 1 through stage 4 chronic kidney disease, or unspecified chronic kidney disease: Secondary | ICD-10-CM

## 2019-12-28 NOTE — Progress Notes (Signed)
Location:    [facility] Room Number: 148/W Place of Service:  SNF (31)   CODE STATUS: Full Code  Allergies  Allergen Reactions  . Latex Rash  . Levofloxacin Nausea And Vomiting  . Neomycin-Bacitracin-Polymyxin  [Bacitracin-Neomycin-Polymyxin] Rash  . Other Rash and Swelling  . Prednisone Hives, Itching, Rash and Other (See Comments)  . Fish Allergy     rash  . Neosporin [Neomycin-Polymyxin-Gramicidin] Itching and Rash    Chief Complaint  Patient presents with  . Hospitalization Follow-up    Hospitalization Follow Up    HPI:  She is a 74 year old woman who has been hospitalized from 12-23-19 through 12-27-19. She had been hospitalized for increased weakness falls and anemia. She did require one unit of PRBC. She is here for short term rehab with her goal to return back home. She does have weakness present in her bilateral upper extremities with the right worse than left. She does have nausea present; her appetite is poor she will continue to be followed for her chronic illnesses including; gastritis gerd copd.   Past Medical History:  Diagnosis Date  . Allergy   . Anemia    pernicious anemia  . Asthma   . Blood transfusion without reported diagnosis   . COPD (chronic obstructive pulmonary disease) ([place]) 12/23/2019  . Glaucoma   . History of blood in urine   . Hypertension   . Osteoporosis   . Type 2 diabetes mellitus ([place]) 12/23/2019    Past Surgical History:  Procedure Laterality Date  . CHOLECYSTECTOMY  2008  . COLONOSCOPY  04/30/2011   Procedure: COLONOSCOPY;  Surgeon: Rogene Houston, MD;  Location: [facility];  Service: Endoscopy;  Laterality: N/A;  . COLONOSCOPY  05/25/2012   Procedure: COLONOSCOPY;  Surgeon: Rogene Houston, MD;  Location: [facility];  Service: Endoscopy;  Laterality: N/A;  1:25-changed to 1200 Ann to notify pt  . COLONOSCOPY Bilateral 12/2017  . ESOPHAGOGASTRODUODENOSCOPY N/A 12/07/2019   Procedure:  ESOPHAGOGASTRODUODENOSCOPY (EGD);  Surgeon: Rogene Houston, MD;  Location: [facility];  Service: Endoscopy;  Laterality: N/A;  155  . GALLBLADDER SURGERY    . right breast cystectomy  1988  . TUBAL LIGATION  1975    Social History   Socioeconomic History  . Marital status: Divorced    Spouse name: Not on file  . Number of children: 4  . Years of education: Not on file  . Highest education level: Not on file  Occupational History  . Not on file  Tobacco Use  . Smoking status: Former Smoker    Packs/day: 1.00    Years: 40.00    Pack years: 40.00    Quit date: 04/03/2011    Years since quitting: 8.7  . Smokeless tobacco: Never Used  Vaping Use  . Vaping Use: Never assessed  Substance and Sexual Activity  . Alcohol use: No  . Drug use: No  . Sexual activity: Not Currently  Other Topics Concern  . Not on file  Social History Narrative  . Not on file   Social Determinants of Health   Financial Resource Strain:   . Difficulty of Paying Living Expenses:   Food Insecurity:   . Worried About Charity fundraiser in the Last Year:   . Arboriculturist in the Last Year:   Transportation Needs:   . Film/video editor (Medical):   Marland Kitchen Lack of Transportation (Non-Medical):   Physical Activity:   .  Days of Exercise per Week:   . Minutes of Exercise per Session:   Stress:   . Feeling of Stress :   Social Connections:   . Frequency of Communication with Friends and Family:   . Frequency of Social Gatherings with Friends and Family:   . Attends Religious Services:   . Active Member of Clubs or Organizations:   . Attends Archivist Meetings:   Marland Kitchen Marital Status:   Intimate Partner Violence:   . Fear of Current or Ex-Partner:   . Emotionally Abused:   Marland Kitchen Physically Abused:   . Sexually Abused:    Family History  Problem Relation Age of Onset  . Diabetes Mother   . Hyperlipidemia Mother   . Hypertension Mother   . Heart disease Father   . Hyperlipidemia  Father   . Diabetes Sister   . Hyperlipidemia Sister   . Hypertension Sister   . Cancer Brother   . Heart disease Brother   . Hyperlipidemia Brother   . Hypertension Brother   . Diabetes Sister       VITAL SIGNS BP 131/84   Pulse 99   Temp 98.8 F (37.1 C) (Oral)   Resp 20   Ht 5\' 4"  (1.626 m)   Wt 112 lb 3.2 oz (50.9 kg)   BMI 19.26 kg/m   Outpatient Encounter Medications as of 12/28/2019  Medication Sig  . acetaminophen (TYLENOL) 325 MG tablet Take 2 tablets (650 mg total) by mouth every 6 (six) hours as needed for mild pain (or Fever >/= 101).  Marland Kitchen albuterol (VENTOLIN HFA) 108 (90 Base) MCG/ACT inhaler Inhale 2 puffs into the lungs every 4 (four) hours as needed for wheezing or shortness of breath.  Marland Kitchen amLODipine (NORVASC) 10 MG tablet Take 1 tablet (10 mg total) by mouth daily.  . cyanocobalamin (,VITAMIN B-12,) 1000 MCG/ML injection Inject 1 mL (1,000 mcg total) into the muscle every 30 (thirty) days.  . dorzolamide (TRUSOPT) 2 % ophthalmic solution Place 1 drop into both eyes 2 (two) times daily.  . folic acid (FOLVITE) 1 MG tablet Take 1 mg by mouth daily.   Marland Kitchen latanoprost (XALATAN) 0.005 % ophthalmic solution Place 1 drop into both eyes at bedtime.  . megestrol (MEGACE) 400 MG/10ML suspension Take 10 mLs (400 mg total) by mouth daily.  . metoCLOPramide (REGLAN) 5 MG tablet Take 1 tablet (5 mg total) by mouth 3 (three) times daily before meals.  . NON FORMULARY Diet: __x___ Regular, ______ NAS, _______Consistent Carbohydrate, _______NPO _____Other  . Nutritional Supplements (ENSURE ACTIVE HIGH PROTEIN) LIQD Take 1 Bottle by mouth 2 (two) times daily at 10 AM and 5 PM.  . omeprazole (PRILOSEC) 40 MG capsule Take 40 mg by mouth daily.   . ondansetron (ZOFRAN ODT) 4 MG disintegrating tablet Take 1 tablet (4 mg total) by mouth every 8 (eight) hours as needed for nausea or vomiting. Dissolve under tongue  . [DISCONTINUED] epoetin alfa (EPOGEN) 3000 UNIT/ML injection Inject 1  mL (3,000 Units total) into the vein once a week.   No facility-administered encounter medications on file as of 12/28/2019.     SIGNIFICANT DIAGNOSTIC EXAMS  TODAY  12-23-19: chest x-ray: No active disease.   12-23-19: ct angio of chest:  1. No evidence of pulmonary embolism. 2. Paraseptal emphysema, mild and worse at the lung apices. 3. 3 mm nodule along the fissure in the RIGHT lower lobe. . 4. Moderate and with 3.6 cm caliber of ascending thoracic aorta.. Aortic aneurysm  5. Emphysema and aortic atherosclerosis.     TODAY  12-23-19: wbc 8.2; hgb 7.3; hct 25.2; mcv 58.3 plt 220; glucose 114; bun 15; creat 1.46; k+ 3.2; na++ 137 ca 9.4 liver normal albumin 4.1 d-dimer 0.86; mag 1.5; phos 3.3; urine culture: e-coli 12-25-19: wbc 8.2; hgb 7.1; hct 23.5; mcv 57.6 plt 215; glucose 111; bun 8; creat 1.08; k+ 4.2; na++ 135 ca 8.8; mag 1.6 12-26-19: wbc 7.9; hgb 9.5; hct 30.0 mcv 63.6 plt 205; glucose 110; bun 13; creat 1.21; k+ 4.1; na++ 136; ca 9.2 phos 3.4 albumin 3.6    Review of Systems  Constitutional: Negative for malaise/fatigue.  Respiratory: Negative for cough and shortness of breath.   Cardiovascular: Negative for chest pain, palpitations and leg swelling.  Gastrointestinal: Positive for nausea. Negative for abdominal pain, constipation, heartburn and vomiting.  Musculoskeletal: Negative for back pain, joint pain and myalgias.  Skin: Negative.   Neurological: Positive for weakness. Negative for dizziness.       In both hands and lower extremities   Psychiatric/Behavioral: The patient is not nervous/anxious.     Physical Exam Constitutional:      General: She is not in acute distress.    Appearance: She is well-developed. She is not diaphoretic.  Neck:     Thyroid: No thyromegaly.     Vascular: Carotid bruit present.  Cardiovascular:     Rate and Rhythm: Normal rate and regular rhythm.     Pulses: Normal pulses.     Heart sounds: Normal heart sounds.  Pulmonary:      Effort: Pulmonary effort is normal. No respiratory distress.     Breath sounds: Normal breath sounds.  Abdominal:     General: Bowel sounds are normal. There is no distension.     Palpations: Abdomen is soft.     Tenderness: There is no abdominal tenderness.  Musculoskeletal:        General: Normal range of motion.     Cervical back: Neck supple.     Right lower leg: No edema.     Left lower leg: No edema.     Comments: Weakness present right > left   Lymphadenopathy:     Cervical: No cervical adenopathy.  Skin:    General: Skin is warm and dry.  Neurological:     Mental Status: She is alert and oriented to person, place, and time.     Comments: Has poor coordination in both hands   Psychiatric:        Mood and Affect: Mood normal.        ASSESSMENT/ PLAN:  TODAY  1. Chronic obstructive pulmonary disease unspecified COPD type: is stable will continue albuterol 2 puffs every 4 hours as needed  2. Other chronic gastritis without hemorrhage: is without change: will continue prilosec 40 mg daily reglan 5 mg three times daily   3. CKD stage 3 due to diabetes mellitus type 2: is stable bun 13 creat 1.21  4. Type 2 diabetes mellitus with stage 3 chronic kidney disease without long term current use of insulin is stable will monitor   5. Hypertension associated with stage 3 chronic kidney disease due to type diabetes mellitus: Is stable b/p 131/84 will continue norvasc 10 mg daily   6. E-coli UTI: will begin rocephin 1 gm daily through 01-04-20  7. Anemia due to chronic renal failure treated with erythropoietin stage 3: is stable hgb 9.5; will continue therapy through hematology   8.  Increased intraocular pressure bilateral: is  stable will continue trusopt to both eyes twice daily and xalatan to both eyes nightly   9. Vitamin B 12 deficiency is stable will continue monthly b 12 injections and folic acid 1 mg daily   10. Failure to thrive in adult: is without change will  continue megace 400 mg daily and supplements as indicated   01-01-20: will check cbc; bmp      MD is aware of resident's narcotic use and is in agreement with current plan of care. We will attempt to wean resident as appropriate.  Ok Edwards NP [facility]  Contact [phone] Monday through Friday 8am- 5pm  After hours call [phone]

## 2019-12-29 ENCOUNTER — Non-Acute Institutional Stay (SKILLED_NURSING_FACILITY): Payer: Medicare Other | Admitting: Internal Medicine

## 2019-12-29 ENCOUNTER — Encounter (INDEPENDENT_AMBULATORY_CARE_PROVIDER_SITE_OTHER): Payer: Self-pay | Admitting: Internal Medicine

## 2019-12-29 ENCOUNTER — Encounter: Payer: Self-pay | Admitting: Internal Medicine

## 2019-12-29 DIAGNOSIS — R000 Tachycardia, unspecified: Secondary | ICD-10-CM | POA: Diagnosis not present

## 2019-12-29 DIAGNOSIS — D509 Iron deficiency anemia, unspecified: Secondary | ICD-10-CM

## 2019-12-29 DIAGNOSIS — R627 Adult failure to thrive: Secondary | ICD-10-CM | POA: Diagnosis not present

## 2019-12-29 NOTE — Assessment & Plan Note (Addendum)
PT/OT @ SNF F/U Hematology appt as scheduled

## 2019-12-29 NOTE — Patient Instructions (Signed)
See assessment and plan under each diagnosis in the problem list and acutely for this visit 

## 2019-12-29 NOTE — Assessment & Plan Note (Addendum)
Repeat BMET, CBC, and update TFTs (free T4, free T3, and TSH) on 6/21 Because of history of anxiety and tremor with documented tachycardia, 24-hour urine for catecholamines and metanephrines will be checked. 6/4 CT of abd/pelvis revealed no adrenal lesions

## 2019-12-29 NOTE — Progress Notes (Signed)
Preop instructions for:  Traci Parker                        Date of Birth    1945/10/18                        Date of Procedure: 01/04/2020       Doctor: Time to arrive at [facility]: 0745 am Report to: Admitting  Procedure:EUS and EGD   Follow any preprocedure instructions given to you by MD.    Do not eat or drink past midnight the night before your procedure.(To include any tube feedings-must be discontinued)    Take these morning medications only with sips of water.(or give through gastrostomy or feeding tube). Amlodipine ( if takes in am ) , Eye drops as usual, Omeprazole ( if takes in am ) , Ventolin Inhaler if needed and send with patient,  Note: No Insulin or Diabetic meds should be given or taken the morning of the procedure!   Facility contact: [employer]                  Phone: [place]:  Transportation contact phone#: [place]  Please send day of procedure:current med list and meds last taken that day, confirm nothing by mouth status from what time, Patient Demographic info( to include DNR status, problem list, allergies)   RN contact name/phone#:                             and Fax [employer] card and picture ID Leave all jewelry and other valuables at place where living( no metal or rings to be worn) No contact lens Women-no make-up, no lotions,perfumes,powders Men-no colognes,lotions  Any questions day of procedure,call  ENDOSCOPY [phone]   Sent from :[facility]                   [place]                   Fax:[phone]  Sent by :  Gillian Shields RN

## 2019-12-29 NOTE — Progress Notes (Signed)
NURSING HOME LOCATION:  [facility] ROOM NUMBER: 148/W   CODE STATUS: Full Code   PCP:  Doree Albee, MD  This is a comprehensive admission note to [facility] performed on this date less than 30 days from date of admission. Included are preadmission medical/surgical history; reconciled medication list; family history; social history and comprehensive review of systems.  Corrections and additions to the records were documented. Comprehensive physical exam was also performed. Additionally a clinical summary was entered for each active diagnosis pertinent to this admission in the Problem List to enhance continuity of care.  HPI: The patient was hospitalized 6/12-6/16/2021 with adult failure to thrive manifested as weakness and shortness of breath.  She was readmitted 2 days after discharge due to generalized weakness and recurrent falls at home.  The prior admission had been because of nausea and vomiting.  Family are concerned as she lives by herself. Admission pulse was 116.  1000 mL normal saline bolus was administered in the ED. Urine was cloudy;microscopic revealed 21-50 RBCs and greater than 50 WBCs with many bacteria.  She was profoundly anemic with hemoglobin 7.3.  1 unit packed red cells was transfused on 6/14 after discussion with Dr. Delton Coombes, Hematologist.   D-dimer was 0.86.  Initial troponin was 29 with a repeat of 31.  Potassium was 3.2 and was repleted.  IV magnesium sulfate was administered from a level of 1.5.  BUN was only 15 but creatinine was elevated at 1.46. Procrit injections 3000 units weekly were to be continued.  Monthly B12 injections were also to be scheduled.  Repeat CBC and BMP were to be completed 6/21. Endoscopic ultrasound/EUS endoscopy is scheduled with Dr. Owens Loffler 6/24 at 9:15 AM.  Hematology follow-up with Dr. Delton Coombes is to be scheduled to monitor anemia.  Past medical and surgical history: Includes dyslipidemia, open-angle  glaucoma, essential hypertension, osteoporosis, history of asthma, COPD, and diabetes. Surgeries include cholecystectomy and breast cystectomy.  Social history: Nondrinker; 40-pack-year history of smoking  Family history: Extensive history reviewedis noncontributory due to her advanced age. Additionally her daughter was present as well. Her daughter relates that she personally has had hypothyroidism and hyperparathyroidism.  The daughter states that her mother's thyroid function was checked in the last several months.  Those results were not found in Epic.   Review of systems: She is alert and oriented and gives an excellent history. The patient states that she lost her sense of smell and taste 3-4 months ago and has lost 30 pounds since that time. She describes nausea and vomiting with foods as well as pills without a definitive pattern or definitive trigger. She describes "pins-and-needles" in her hands and numbness at the left lip. This has been associated with "slobbering". She states that she has lost strength in her legs. Her daughter questions whether she might of had a stroke. She describes anxiety and intermittent tremor.  Constitutional: No fever  Eyes: No redness, discharge, pain, vision change ENT/mouth: No nasal congestion, purulent discharge, earache, change in hearing, sore throat  Cardiovascular: No chest pain,  paroxysmal nocturnal dyspnea, claudication, edema  Respiratory: No cough, sputum production, hemoptysis, significant snoring, apnea  Gastrointestinal: No heartburn, dysphagia, abdominal pain,  rectal bleeding, melena, change in bowels Genitourinary: No dysuria, hematuria, pyuria, incontinence, nocturia Musculoskeletal: No joint stiffness, joint swelling,  Dermatologic: No rash, pruritus, change in appearance of skin Neurologic: No dizziness, headache, syncope, seizures Psychiatric: No significant insomnia, anorexia Endocrine: No change in hair/skin/nails, excessive  thirst, excessive  hunger, excessive urination  Hematologic/lymphatic: No significant bruising, lymphadenopathy, abnormal bleeding Allergy/immunology: No itchy/watery eyes, significant sneezing, urticaria, angioedema  Physical exam:  Pertinent or positive findings: She is thin and appears suboptimally nourished. There is some muscular wasting of the face. She has arcus senilis. She is completely edentulous. There is irregular hyperpigmentation over the bridge of the nose. First heart sound is increased. She exhibits tachycardia. Faint right carotid bruit is present. Breath sounds are decreased especially on the left compared to the right. Dorsalis pedis pulses are stronger than posterior tibial pulses. She has a flexion contracture of the right index finger@ the DIP. The left upper and left lower extremities are weaker to opposition than the right.  General appearance:no acute distress, increased work of breathing is present.   Lymphatic: No lymphadenopathy about the head, neck, axilla. Eyes: No conjunctival inflammation or lid edema is present. There is no scleral icterus. Ears:  External ear exam shows no significant lesions or deformities.   Nose:  External nasal examination shows no deformity or inflammation. Nasal mucosa are pink and moist without lesions, exudates Oral exam: Lips and gums are healthy appearing.There is no oropharyngeal erythema or exudate. Neck:  No thyromegaly, masses, tenderness noted.    Heart:  No murmur, click, rub.  Lungs:  without wheezes, rhonchi, rales, rubs. Abdomen: Bowel sounds are normal.  Abdomen is soft and nontender with no organomegaly, hernias, masses. GU: Deferred  Extremities:  No cyanosis, clubbing, edema. Neurologic exam: Balance, Rhomberg, finger to nose testing could not be completed due to clinical state Skin: Warm & dry w/o tenting. No significant lesions or rash.  See clinical summary under each active problem in the Problem List with  associated updated therapeutic plan

## 2019-12-30 NOTE — Assessment & Plan Note (Signed)
Anemia improved serially GI evaluation w Dr Ardis Hughs pending 6/24

## 2020-01-01 ENCOUNTER — Encounter: Payer: Self-pay | Admitting: Internal Medicine

## 2020-01-01 ENCOUNTER — Other Ambulatory Visit (HOSPITAL_COMMUNITY)
Admission: RE | Admit: 2020-01-01 | Discharge: 2020-01-01 | Disposition: A | Discharge status: Home / Self Care | Payer: Medicare Other | Source: Skilled Nursing Facility | Attending: Internal Medicine | Admitting: Internal Medicine

## 2020-01-01 ENCOUNTER — Other Ambulatory Visit: Payer: Self-pay | Admitting: Adult Health

## 2020-01-01 ENCOUNTER — Inpatient Hospital Stay (HOSPITAL_COMMUNITY): Admit: 2020-01-01 | Payer: Medicare Other

## 2020-01-01 DIAGNOSIS — Z86010 Personal history of colonic polyps: Secondary | ICD-10-CM | POA: Insufficient documentation

## 2020-01-01 DIAGNOSIS — N189 Chronic kidney disease, unspecified: Secondary | ICD-10-CM | POA: Insufficient documentation

## 2020-01-01 LAB — T4, FREE: Free T4: 1.37 ng/dL — ABNORMAL HIGH (ref 0.61–1.12)

## 2020-01-01 LAB — BASIC METABOLIC PANEL
Anion gap: 10 (ref 5–15)
BUN: 21 mg/dL (ref 8–23)
CO2: 24 mmol/L (ref 22–32)
Calcium: 9.1 mg/dL (ref 8.9–10.3)
Chloride: 99 mmol/L (ref 98–111)
Creatinine, Ser: 1.41 mg/dL — ABNORMAL HIGH (ref 0.44–1.00)
GFR calc Af Amer: 42 mL/min — ABNORMAL LOW (ref >60)
GFR calc non Af Amer: 36 mL/min — ABNORMAL LOW (ref >60)
Glucose, Bld: 99 mg/dL (ref 70–99)
Potassium: 3.9 mmol/L (ref 3.5–5.1)
Sodium: 133 mmol/L — ABNORMAL LOW (ref 135–145)

## 2020-01-01 LAB — CBC
HCT: 32.5 % — ABNORMAL LOW (ref 36.0–46.0)
Hemoglobin: 9.9 g/dL — ABNORMAL LOW (ref 12.0–15.0)
MCH: 19.9 pg — ABNORMAL LOW (ref 26.0–34.0)
MCHC: 30.5 g/dL (ref 30.0–36.0)
MCV: 65.4 fL — ABNORMAL LOW (ref 80.0–100.0)
Platelets: 282 10*3/uL (ref 150–400)
RBC: 4.97 MIL/uL (ref 3.87–5.11)
RDW: 33 % — ABNORMAL HIGH (ref 11.5–15.5)
WBC: 10.8 10*3/uL — ABNORMAL HIGH (ref 4.0–10.5)
nRBC: 0 % (ref 0.0–0.2)

## 2020-01-01 LAB — TSH: TSH: 7.15 u[IU]/mL — ABNORMAL HIGH (ref 0.350–4.500)

## 2020-01-01 LAB — MAGNESIUM: Magnesium: 1.9 mg/dL (ref 1.7–2.4)

## 2020-01-02 ENCOUNTER — Non-Acute Institutional Stay (SKILLED_NURSING_FACILITY): Payer: Medicare Other | Admitting: Adult Health

## 2020-01-02 ENCOUNTER — Encounter (HOSPITAL_COMMUNITY)
Admission: RE | Admit: 2020-01-02 | Discharge: 2020-01-02 | Disposition: A | Discharge status: Home / Self Care | Payer: Medicare Other | Source: Ambulatory Visit | Attending: Nephrology | Admitting: Nephrology

## 2020-01-02 ENCOUNTER — Encounter: Payer: Self-pay | Admitting: Adult Health

## 2020-01-02 DIAGNOSIS — E069 Thyroiditis, unspecified: Secondary | ICD-10-CM | POA: Diagnosis not present

## 2020-01-02 LAB — T3, FREE: T3, Free: 2.1 pg/mL (ref 2.0–4.4)

## 2020-01-02 LAB — POCT HEMOGLOBIN-HEMACUE: Hemoglobin: 9.9 g/dL — ABNORMAL LOW (ref 12.0–15.0)

## 2020-01-02 MED ORDER — EPOETIN ALFA 3000 UNIT/ML IJ SOLN
INTRAMUSCULAR | Status: AC
  Filled 2020-01-02: qty 1

## 2020-01-02 MED ORDER — EPOETIN ALFA 3000 UNIT/ML IJ SOLN
3000.0000 [IU] | Freq: Once | INTRAMUSCULAR | Status: AC
  Administered 2020-01-02: 3000 [IU] via SUBCUTANEOUS

## 2020-01-02 NOTE — Progress Notes (Addendum)
Location:    [place] Room Number: 148/W Place of Service:  SNF (31)   CODE STATUS: Full Code  Allergies  Allergen Reactions  . Latex Rash  . Levofloxacin Nausea And Vomiting  . Neomycin-Bacitracin-Polymyxin  [Bacitracin-Neomycin-Polymyxin] Rash  . Other Rash and Swelling  . Prednisone Hives, Itching, Rash and Other (See Comments)  . Fish Allergy     rash  . Neosporin [Neomycin-Polymyxin-Gramicidin] Itching and Rash    Chief Complaint  Patient presents with  . Follow-up    Lab Follow Up    HPI:  Her tsh is elevated with normal free hormone levels. She denies any excessive fatigue or sleepiness. The tyroid labs are compensated a this time. More than likely this is self resolving. Will need to continue to monitor her labs.    Past Medical History:  Diagnosis Date  . Allergy   . Anemia    pernicious anemia  . Asthma   . Blood transfusion without reported diagnosis   . COPD (chronic obstructive pulmonary disease) ([place]) 12/23/2019  . Glaucoma   . History of blood in urine   . Hypertension   . Osteoporosis   . Type 2 diabetes mellitus ([place]) 12/23/2019    Past Surgical History:  Procedure Laterality Date  . CHOLECYSTECTOMY  2008  . COLONOSCOPY  04/30/2011   Procedure: COLONOSCOPY;  Surgeon: Rogene Houston, MD;  [facility];  Service: Endoscopy;  Laterality: N/A;  . COLONOSCOPY  05/25/2012   Procedure: COLONOSCOPY;  Surgeon: Rogene Houston, MD;  [facility];  Service: Endoscopy;  Laterality: N/A;  1:25-changed to 1200 Ann to notify pt  . COLONOSCOPY Bilateral 12/2017  . ESOPHAGOGASTRODUODENOSCOPY N/A 12/07/2019   Procedure: ESOPHAGOGASTRODUODENOSCOPY (EGD);  Surgeon: Rogene Houston, MD;  [facility];  Service: Endoscopy;  Laterality: N/A;  155  . GALLBLADDER SURGERY    . right breast cystectomy  1988  . TUBAL LIGATION  1975    Social History   Socioeconomic History  . Marital status: Divorced     Spouse name: Not on file  . Number of children: 4  . Years of education: Not on file  . Highest education level: Not on file  Occupational History  . Not on file  Tobacco Use  . Smoking status: Former Smoker    Packs/day: 1.00    Years: 40.00    Pack years: 40.00    Quit date: 04/03/2011    Years since quitting: 8.7  . Smokeless tobacco: Never Used  Vaping Use  . Vaping Use: Never assessed  Substance and Sexual Activity  . Alcohol use: No  . Drug use: No  . Sexual activity: Not Currently  Other Topics Concern  . Not on file  Social History Narrative  . Not on file   Social Determinants of Health   Financial Resource Strain:   . Difficulty of Paying Living Expenses:   Food Insecurity:   . Worried About Charity fundraiser in the Last Year:   . Arboriculturist in the Last Year:   Transportation Needs:   . Film/video editor (Medical):   Marland Kitchen Lack of Transportation (Non-Medical):   Physical Activity:   . Days of Exercise per Week:   . Minutes of Exercise per Session:   Stress:   . Feeling of Stress :   Social Connections:   . Frequency of Communication with Friends and Family:   . Frequency of Social Gatherings with  Friends and Family:   . Attends Religious Services:   . Active Member of Clubs or Organizations:   . Attends Archivist Meetings:   Marland Kitchen Marital Status:   Intimate Partner Violence:   . Fear of Current or Ex-Partner:   . Emotionally Abused:   Marland Kitchen Physically Abused:   . Sexually Abused:    Family History  Problem Relation Age of Onset  . Diabetes Mother   . Hyperlipidemia Mother   . Hypertension Mother   . Heart disease Father   . Hyperlipidemia Father   . Diabetes Sister   . Hyperlipidemia Sister   . Hypertension Sister   . Cancer Brother   . Heart disease Brother   . Hyperlipidemia Brother   . Hypertension Brother   . Diabetes Sister       VITAL SIGNS BP 117/86   Pulse 60   Temp 98.6 F (37 C) (Oral)   Resp 18   Ht 5\' 4"   (1.626 m)   Wt 110 lb 3.2 oz (50 kg)   SpO2 93%   BMI 18.92 kg/m   Outpatient Encounter Medications as of 01/02/2020  Medication Sig  . acetaminophen (TYLENOL) 325 MG tablet Take 2 tablets (650 mg total) by mouth every 6 (six) hours as needed for mild pain (or Fever >/= 101).  Marland Kitchen albuterol (VENTOLIN HFA) 108 (90 Base) MCG/ACT inhaler Inhale 2 puffs into the lungs every 4 (four) hours as needed for wheezing or shortness of breath.  Marland Kitchen amLODipine (NORVASC) 10 MG tablet Take 1 tablet (10 mg total) by mouth daily.  . cefTRIAXone (ROCEPHIN) 1 g injection Inject 1 g into the muscle daily.  . cyanocobalamin (,VITAMIN B-12,) 1000 MCG/ML injection Inject 1 mL (1,000 mcg total) into the muscle every 30 (thirty) days.  . dorzolamide (TRUSOPT) 2 % ophthalmic solution Place 1 drop into both eyes 2 (two) times daily.  . feeding supplement, ENSURE ENLIVE, (ENSURE ENLIVE) LIQD Take 237 mLs by mouth 2 (two) times daily between meals.  . folic acid (FOLVITE) 1 MG tablet Take 1 mg by mouth daily.   Marland Kitchen latanoprost (XALATAN) 0.005 % ophthalmic solution Place 1 drop into both eyes at bedtime.  . megestrol (MEGACE) 400 MG/10ML suspension Take 10 mLs (400 mg total) by mouth daily.  . metoCLOPramide (REGLAN) 5 MG tablet Take 1 tablet (5 mg total) by mouth 3 (three) times daily before meals.  . NON FORMULARY Diet Change: Pureed with thin liquids.  Marland Kitchen omeprazole (PRILOSEC) 40 MG capsule Take 40 mg by mouth daily.   . ondansetron (ZOFRAN ODT) 4 MG disintegrating tablet Take 1 tablet (4 mg total) by mouth every 8 (eight) hours as needed for nausea or vomiting. Dissolve under tongue   No facility-administered encounter medications on file as of 01/02/2020.     SIGNIFICANT DIAGNOSTIC EXAMS   PREVIOUS   12-23-19: chest x-ray: No active disease.   12-23-19: ct angio of chest:  1. No evidence of pulmonary embolism. 2. Paraseptal emphysema, mild and worse at the lung apices. 3. 3 mm nodule along the fissure in the RIGHT  lower lobe. . 4. Moderate and with 3.6 cm caliber of ascending thoracic aorta.. Aortic aneurysm  5. Emphysema and aortic atherosclerosis.   NO NEW EXAMS   LABS REVIEWED PREVIOUS   12-23-19: wbc 8.2; hgb 7.3; hct 25.2; mcv 58.3 plt 220; glucose 114; bun 15; creat 1.46; k+ 3.2; na++ 137 ca 9.4 liver normal albumin 4.1 d-dimer 0.86; mag 1.5; phos 3.3; urine culture: e-coli  12-25-19: wbc 8.2; hgb 7.1; hct 23.5; mcv 57.6 plt 215; glucose 111; bun 8; creat 1.08; k+ 4.2; na++ 135 ca 8.8; mag 1.6 12-26-19: wbc 7.9; hgb 9.5; hct 30.0 mcv 63.6 plt 205; glucose 110; bun 13; creat 1.21; k+ 4.1; na++ 136; ca 9.2 phos 3.4 albumin 3.6   TODAY   01-01-20: wbc 10.8; hgb 9.9; hct 32.5; mcv 65.4 plt 282; glucose 99; bun 21; creat 1.41; k+ 3.9; na++ 133;ca 9.1 mag 1.9 free t4: 1.37 free t3: 2.1 tsh 7.150  Review of Systems  Constitutional: Negative for malaise/fatigue.  Respiratory: Negative for cough and shortness of breath.   Cardiovascular: Negative for chest pain, palpitations and leg swelling.  Gastrointestinal: Negative for abdominal pain, constipation and heartburn.  Musculoskeletal: Negative for back pain, joint pain and myalgias.  Skin: Negative.   Neurological: Positive for weakness. Negative for dizziness.       In both hands and lower extremities    Psychiatric/Behavioral: The patient is not nervous/anxious.     Physical Exam Constitutional:      General: She is not in acute distress.    Appearance: She is well-developed. She is not diaphoretic.  Neck:     Thyroid: No thyromegaly.     Vascular: Carotid bruit present.  Cardiovascular:     Rate and Rhythm: Normal rate and regular rhythm.     Heart sounds: Normal heart sounds.  Pulmonary:     Effort: Pulmonary effort is normal. No respiratory distress.     Breath sounds: Normal breath sounds.  Abdominal:     General: Bowel sounds are normal. There is no distension.     Palpations: Abdomen is soft.     Tenderness: There is no abdominal  tenderness.  Musculoskeletal:        General: Normal range of motion.     Cervical back: Neck supple.     Right lower leg: No edema.     Left lower leg: No edema.     Comments: Has weakness present in all four extremities with right >left.   Lymphadenopathy:     Cervical: No cervical adenopathy.  Skin:    General: Skin is warm and dry.  Neurological:     Mental Status: She is alert and oriented to person, place, and time.     Comments: Poor coordination in both hands       ASSESSMENT/ PLAN:  TODAY  1. Thyroiditis: is compensation with elevated tsh and normal free hormones; will continue to monitor her status and will make further adjustments as indicated.     MD is aware of resident's narcotic use and is in agreement with current plan of care. We will attempt to wean resident as appropriate.  Ok Edwards NP [facility]  Contact [phone] Monday through Friday 8am- 5pm  After hours call [phone]

## 2020-01-03 ENCOUNTER — Encounter (HOSPITAL_COMMUNITY): Payer: Self-pay | Admitting: Nurse Practitioner

## 2020-01-03 ENCOUNTER — Encounter: Payer: Self-pay | Admitting: Adult Health

## 2020-01-03 ENCOUNTER — Non-Acute Institutional Stay (SKILLED_NURSING_FACILITY): Payer: Medicare Other | Admitting: Adult Health

## 2020-01-03 DIAGNOSIS — G6289 Other specified polyneuropathies: Secondary | ICD-10-CM

## 2020-01-03 NOTE — Progress Notes (Signed)
Location:    [place] Room Number: 148/W Place of Service:  SNF (31)   CODE STATUS: Full Code  Allergies  Allergen Reactions  . Latex Rash  . Levofloxacin Nausea And Vomiting  . Neomycin-Bacitracin-Polymyxin  [Bacitracin-Neomycin-Polymyxin] Rash  . Other Rash and Swelling  . Prednisone Hives, Itching, Rash and Other (See Comments)  . Fish Allergy     rash  . Neosporin [Neomycin-Polymyxin-Gramicidin] Itching and Rash    Chief Complaint  Patient presents with  . Acute Visit    Pain Management    HPI:  She is complaining of bilateral feet numbness and tingling with bilateral hand numbness as well. The pain is interfering in her quality of life. The numbness is interfering with her ability to perform her own adls including self feeding. She has had this since prior to her hospitalization. She would like something that would help with her pain management.    Past Medical History:  Diagnosis Date  . Allergy   . Anemia    pernicious anemia  . Asthma   . Blood transfusion without reported diagnosis   . COPD (chronic obstructive pulmonary disease) ([place]) 12/23/2019  . Glaucoma   . History of blood in urine   . Hypertension   . Osteoporosis   . Type 2 diabetes mellitus ([place]) 12/23/2019    Past Surgical History:  Procedure Laterality Date  . CHOLECYSTECTOMY  2008  . COLONOSCOPY  04/30/2011   Procedure: COLONOSCOPY;  Surgeon: Rogene Houston, MD;  [facility];  Service: Endoscopy;  Laterality: N/A;  . COLONOSCOPY  05/25/2012   Procedure: COLONOSCOPY;  Surgeon: Rogene Houston, MD;  [facility];  Service: Endoscopy;  Laterality: N/A;  1:25-changed to 1200 Ann to notify pt  . COLONOSCOPY Bilateral 12/2017  . ESOPHAGOGASTRODUODENOSCOPY N/A 12/07/2019   Procedure: ESOPHAGOGASTRODUODENOSCOPY (EGD);  Surgeon: Rogene Houston, MD;  [facility];  Service: Endoscopy;  Laterality: N/A;  155  . GALLBLADDER SURGERY    . right  breast cystectomy  1988  . TUBAL LIGATION  1975    Social History   Socioeconomic History  . Marital status: Divorced    Spouse name: Not on file  . Number of children: 4  . Years of education: Not on file  . Highest education level: Not on file  Occupational History  . Not on file  Tobacco Use  . Smoking status: Former Smoker    Packs/day: 1.00    Years: 40.00    Pack years: 40.00    Quit date: 04/03/2011    Years since quitting: 8.7  . Smokeless tobacco: Never Used  Vaping Use  . Vaping Use: Never assessed  Substance and Sexual Activity  . Alcohol use: No  . Drug use: No  . Sexual activity: Not Currently  Other Topics Concern  . Not on file  Social History Narrative  . Not on file   Social Determinants of Health   Financial Resource Strain:   . Difficulty of Paying Living Expenses:   Food Insecurity:   . Worried About Charity fundraiser in the Last Year:   . Arboriculturist in the Last Year:   Transportation Needs:   . Film/video editor (Medical):   Marland Kitchen Lack of Transportation (Non-Medical):   Physical Activity:   . Days of Exercise per Week:   . Minutes of Exercise per Session:   Stress:   . Feeling of Stress :   Social  Connections:   . Frequency of Communication with Friends and Family:   . Frequency of Social Gatherings with Friends and Family:   . Attends Religious Services:   . Active Member of Clubs or Organizations:   . Attends Archivist Meetings:   Marland Kitchen Marital Status:   Intimate Partner Violence:   . Fear of Current or Ex-Partner:   . Emotionally Abused:   Marland Kitchen Physically Abused:   . Sexually Abused:    Family History  Problem Relation Age of Onset  . Diabetes Mother   . Hyperlipidemia Mother   . Hypertension Mother   . Heart disease Father   . Hyperlipidemia Father   . Diabetes Sister   . Hyperlipidemia Sister   . Hypertension Sister   . Cancer Brother   . Heart disease Brother   . Hyperlipidemia Brother   . Hypertension  Brother   . Diabetes Sister       VITAL SIGNS BP 112/68   Pulse (!) 105   Temp 97.7 F (36.5 C) (Oral)   Resp 18   Ht 5\' 4"  (1.626 m)   Wt 112 lb 3.2 oz (50.9 kg)   SpO2 93%   BMI 19.26 kg/m   Outpatient Encounter Medications as of 01/03/2020  Medication Sig  . acetaminophen (TYLENOL) 325 MG tablet Take 2 tablets (650 mg total) by mouth every 6 (six) hours as needed for mild pain (or Fever >/= 101).  Marland Kitchen albuterol (VENTOLIN HFA) 108 (90 Base) MCG/ACT inhaler Inhale 2 puffs into the lungs every 4 (four) hours as needed for wheezing or shortness of breath.  Marland Kitchen amLODipine (NORVASC) 10 MG tablet Take 1 tablet (10 mg total) by mouth daily.  . cefTRIAXone (ROCEPHIN) 1 g injection Inject 1 g into the muscle daily.  . cyanocobalamin (,VITAMIN B-12,) 1000 MCG/ML injection Inject 1 mL (1,000 mcg total) into the muscle every 30 (thirty) days.  . dorzolamide (TRUSOPT) 2 % ophthalmic solution Place 1 drop into both eyes 2 (two) times daily.  . feeding supplement, ENSURE ENLIVE, (ENSURE ENLIVE) LIQD Take 237 mLs by mouth 2 (two) times daily between meals.  . folic acid (FOLVITE) 1 MG tablet Take 1 mg by mouth daily.   Marland Kitchen gabapentin (NEURONTIN) 100 MG capsule Take 100 mg by mouth at bedtime.  Marland Kitchen latanoprost (XALATAN) 0.005 % ophthalmic solution Place 1 drop into both eyes at bedtime.  . megestrol (MEGACE) 400 MG/10ML suspension Take 10 mLs (400 mg total) by mouth daily.  . metoCLOPramide (REGLAN) 5 MG tablet Take 1 tablet (5 mg total) by mouth 3 (three) times daily before meals.  . NON FORMULARY Diet Change: Pureed with thin liquids.  Marland Kitchen omeprazole (PRILOSEC) 40 MG capsule Take 40 mg by mouth daily.   . ondansetron (ZOFRAN ODT) 4 MG disintegrating tablet Take 1 tablet (4 mg total) by mouth every 8 (eight) hours as needed for nausea or vomiting. Dissolve under tongue   No facility-administered encounter medications on file as of 01/03/2020.     SIGNIFICANT DIAGNOSTIC EXAMS   PREVIOUS   12-23-19:  chest x-ray: No active disease.   12-23-19: ct angio of chest:  1. No evidence of pulmonary embolism. 2. Paraseptal emphysema, mild and worse at the lung apices. 3. 3 mm nodule along the fissure in the RIGHT lower lobe. . 4. Moderate and with 3.6 cm caliber of ascending thoracic aorta.. Aortic aneurysm  5. Emphysema and aortic atherosclerosis.   NO NEW EXAMS   LABS REVIEWED PREVIOUS   12-23-19: wbc  8.2; hgb 7.3; hct 25.2; mcv 58.3 plt 220; glucose 114; bun 15; creat 1.46; k+ 3.2; na++ 137 ca 9.4 liver normal albumin 4.1 d-dimer 0.86; mag 1.5; phos 3.3; urine culture: e-coli 12-25-19: wbc 8.2; hgb 7.1; hct 23.5; mcv 57.6 plt 215; glucose 111; bun 8; creat 1.08; k+ 4.2; na++ 135 ca 8.8; mag 1.6 12-26-19: wbc 7.9; hgb 9.5; hct 30.0 mcv 63.6 plt 205; glucose 110; bun 13; creat 1.21; k+ 4.1; na++ 136; ca 9.2 phos 3.4 albumin 3.6  01-01-20: wbc 10.8; hgb 9.9; hct 32.5; mcv 65.4 plt 282; glucose 99; bun 21; creat 1.41; k+ 3.9; na++ 133;ca 9.1 mag 1.9 free t4: 1.37 free t3: 2.1 tsh 7.150  NO NEW LABS.   Review of Systems  Constitutional: Negative for malaise/fatigue.  Respiratory: Negative for cough and shortness of breath.   Cardiovascular: Negative for chest pain, palpitations and leg swelling.  Gastrointestinal: Negative for abdominal pain, constipation and heartburn.  Musculoskeletal: Negative for back pain, joint pain and myalgias.  Skin: Negative.   Neurological: Positive for tingling and weakness. Negative for dizziness.  Psychiatric/Behavioral: The patient is not nervous/anxious.     Physical Exam Constitutional:      General: She is not in acute distress.    Appearance: She is well-developed. She is not diaphoretic.  Neck:     Thyroid: No thyromegaly.     Vascular: Carotid bruit present.  Cardiovascular:     Rate and Rhythm: Normal rate and regular rhythm.     Pulses: Normal pulses.     Heart sounds: Normal heart sounds.  Pulmonary:     Effort: Pulmonary effort is normal. No  respiratory distress.     Breath sounds: Normal breath sounds.  Abdominal:     General: Bowel sounds are normal. There is no distension.     Palpations: Abdomen is soft.     Tenderness: There is no abdominal tenderness.  Musculoskeletal:        General: Normal range of motion.     Cervical back: Neck supple.     Right lower leg: No edema.     Left lower leg: No edema.     Comments: Has weakness present in all four extremities with right >left.  Lymphadenopathy:     Cervical: No cervical adenopathy.  Skin:    General: Skin is warm and dry.  Neurological:     Mental Status: She is alert and oriented to person, place, and time.  Psychiatric:        Mood and Affect: Mood normal.       ASSESSMENT/ PLAN:  TODAY  1. Peripheral neuropathy: is worse will begin gabapentin 100 mg nightly and will monitor her status.    MD is aware of resident's narcotic use and is in agreement with current plan of care. We will attempt to wean resident as appropriate.  Ok Edwards NP [facility]  Contact [phone] Monday through Friday 8am- 5pm  After hours call [phone]

## 2020-01-04 ENCOUNTER — Encounter: Payer: Self-pay | Admitting: Adult Health

## 2020-01-04 ENCOUNTER — Ambulatory Visit (HOSPITAL_COMMUNITY): Admission: RE | Admit: 2020-01-04 | Payer: Medicare Other | Source: Home / Self Care | Admitting: Gastroenterology

## 2020-01-04 ENCOUNTER — Non-Acute Institutional Stay (SKILLED_NURSING_FACILITY): Payer: Medicare Other | Admitting: Adult Health

## 2020-01-04 DIAGNOSIS — N1832 Chronic kidney disease, stage 3b: Secondary | ICD-10-CM

## 2020-01-04 DIAGNOSIS — E1122 Type 2 diabetes mellitus with diabetic chronic kidney disease: Secondary | ICD-10-CM

## 2020-01-04 DIAGNOSIS — N183 Chronic kidney disease, stage 3 unspecified: Secondary | ICD-10-CM | POA: Diagnosis not present

## 2020-01-04 DIAGNOSIS — R270 Ataxia, unspecified: Secondary | ICD-10-CM | POA: Diagnosis not present

## 2020-01-04 DIAGNOSIS — E1121 Type 2 diabetes mellitus with diabetic nephropathy: Secondary | ICD-10-CM

## 2020-01-04 SURGERY — UPPER ENDOSCOPIC ULTRASOUND (EUS) RADIAL
Anesthesia: Monitor Anesthesia Care

## 2020-01-04 NOTE — Progress Notes (Signed)
Location:    [facility] Room Number: 148/W Place of Service:  SNF (31)   CODE STATUS: Full Code  Allergies  Allergen Reactions  . Latex Rash  . Levofloxacin Nausea And Vomiting  . Neomycin-Bacitracin-Polymyxin  [Bacitracin-Neomycin-Polymyxin] Rash  . Other Rash and Swelling  . Prednisone Hives, Itching, Rash and Other (See Comments)  . Fish Allergy     rash  . Neosporin [Neomycin-Polymyxin-Gramicidin] Itching and Rash    Chief Complaint  Patient presents with  . Short Term Rehab          Ataxia:   CKD stage 3 due to diabetes mellitus type 2:  Type 2 diabetes mellitus with stage 3 chronic kidney disease without long term current use of insulin  Weekly follow up for the first 30 days post hospitalization.        HPI:  She is a 74 year old short term rehab patient being seen for the management of her chronic illnesses: ataxia; ckd; diabetes. She continues to have significant issues with strength; balance coordination; she is unable to feed herself. There are no reports of uncontrolled pain. Her appetite is poor to fair.   Past Medical History:  Diagnosis Date  . Allergy   . Anemia    pernicious anemia  . Asthma   . Blood transfusion without reported diagnosis   . COPD (chronic obstructive pulmonary disease) ([place]) 12/23/2019  . Glaucoma   . History of blood in urine   . Hypertension   . Osteoporosis   . Type 2 diabetes mellitus ([place]) 12/23/2019    Past Surgical History:  Procedure Laterality Date  . CHOLECYSTECTOMY  2008  . COLONOSCOPY  04/30/2011   Procedure: COLONOSCOPY;  Surgeon: Rogene Houston, MD;  [facility];  Service: Endoscopy;  Laterality: N/A;  . COLONOSCOPY  05/25/2012   Procedure: COLONOSCOPY;  Surgeon: Rogene Houston, MD;  [facility];  Service: Endoscopy;  Laterality: N/A;  1:25-changed to 1200 Ann to notify pt  . COLONOSCOPY Bilateral 12/2017  . ESOPHAGOGASTRODUODENOSCOPY N/A 12/07/2019   Procedure:  ESOPHAGOGASTRODUODENOSCOPY (EGD);  Surgeon: Rogene Houston, MD;  [facility];  Service: Endoscopy;  Laterality: N/A;  155  . GALLBLADDER SURGERY    . right breast cystectomy  1988  . TUBAL LIGATION  1975    Social History   Socioeconomic History  . Marital status: Divorced    Spouse name: Not on file  . Number of children: 4  . Years of education: Not on file  . Highest education level: Not on file  Occupational History  . Not on file  Tobacco Use  . Smoking status: Former Smoker    Packs/day: 1.00    Years: 40.00    Pack years: 40.00    Quit date: 04/03/2011    Years since quitting: 8.7  . Smokeless tobacco: Never Used  Vaping Use  . Vaping Use: Never assessed  Substance and Sexual Activity  . Alcohol use: No  . Drug use: No  . Sexual activity: Not Currently  Other Topics Concern  . Not on file  Social History Narrative  . Not on file   Social Determinants of Health   Financial Resource Strain:   . Difficulty of Paying Living Expenses:   Food Insecurity:   . Worried About Charity fundraiser in the Last Year:   . Arboriculturist in the Last Year:   Transportation Needs:   . Lack of Transportation (  Medical):   Marland Kitchen Lack of Transportation (Non-Medical):   Physical Activity:   . Days of Exercise per Week:   . Minutes of Exercise per Session:   Stress:   . Feeling of Stress :   Social Connections:   . Frequency of Communication with Friends and Family:   . Frequency of Social Gatherings with Friends and Family:   . Attends Religious Services:   . Active Member of Clubs or Organizations:   . Attends Archivist Meetings:   Marland Kitchen Marital Status:   Intimate Partner Violence:   . Fear of Current or Ex-Partner:   . Emotionally Abused:   Marland Kitchen Physically Abused:   . Sexually Abused:    Family History  Problem Relation Age of Onset  . Diabetes Mother   . Hyperlipidemia Mother   . Hypertension Mother   . Heart disease Father   . Hyperlipidemia  Father   . Diabetes Sister   . Hyperlipidemia Sister   . Hypertension Sister   . Cancer Brother   . Heart disease Brother   . Hyperlipidemia Brother   . Hypertension Brother   . Diabetes Sister       VITAL SIGNS BP 107/64   Pulse 99   Temp 97.7 F (36.5 C) (Oral)   Resp 18   Ht 5\' 4"  (1.626 m)   Wt 112 lb 3.2 oz (50.9 kg)   SpO2 93%   BMI 19.26 kg/m   Outpatient Encounter Medications as of 01/04/2020  Medication Sig  . acetaminophen (TYLENOL) 325 MG tablet Take 2 tablets (650 mg total) by mouth every 6 (six) hours as needed for mild pain (or Fever >/= 101).  Marland Kitchen albuterol (VENTOLIN HFA) 108 (90 Base) MCG/ACT inhaler Inhale 2 puffs into the lungs every 4 (four) hours as needed for wheezing or shortness of breath.  Marland Kitchen amLODipine (NORVASC) 10 MG tablet Take 1 tablet (10 mg total) by mouth daily.  . cefTRIAXone (ROCEPHIN) 1 g injection Inject 1 g into the muscle daily.  . cyanocobalamin (,VITAMIN B-12,) 1000 MCG/ML injection Inject 1 mL (1,000 mcg total) into the muscle every 30 (thirty) days.  . dorzolamide (TRUSOPT) 2 % ophthalmic solution Place 1 drop into both eyes 2 (two) times daily.  . feeding supplement, ENSURE ENLIVE, (ENSURE ENLIVE) LIQD Take 237 mLs by mouth 2 (two) times daily between meals.  . folic acid (FOLVITE) 1 MG tablet Take 1 mg by mouth daily.   Marland Kitchen gabapentin (NEURONTIN) 100 MG capsule Take 100 mg by mouth at bedtime.  Marland Kitchen latanoprost (XALATAN) 0.005 % ophthalmic solution Place 1 drop into both eyes at bedtime.  . megestrol (MEGACE) 400 MG/10ML suspension Take 10 mLs (400 mg total) by mouth daily.  . metoCLOPramide (REGLAN) 5 MG tablet Take 1 tablet (5 mg total) by mouth 3 (three) times daily before meals.  . NON FORMULARY Diet Change: Pureed with thin liquids.  Marland Kitchen omeprazole (PRILOSEC) 40 MG capsule Take 40 mg by mouth daily.   . ondansetron (ZOFRAN ODT) 4 MG disintegrating tablet Take 1 tablet (4 mg total) by mouth every 8 (eight) hours as needed for nausea or  vomiting. Dissolve under tongue   No facility-administered encounter medications on file as of 01/04/2020.     SIGNIFICANT DIAGNOSTIC EXAMS   PREVIOUS   12-23-19: chest x-ray: No active disease.   12-23-19: ct angio of chest:  1. No evidence of pulmonary embolism. 2. Paraseptal emphysema, mild and worse at the lung apices. 3. 3 mm nodule along the  fissure in the RIGHT lower lobe. . 4. Moderate and with 3.6 cm caliber of ascending thoracic aorta.. Aortic aneurysm  5. Emphysema and aortic atherosclerosis.   NO NEW EXAMS   LABS REVIEWED PREVIOUS   12-23-19: wbc 8.2; hgb 7.3; hct 25.2; mcv 58.3 plt 220; glucose 114; bun 15; creat 1.46; k+ 3.2; na++ 137 ca 9.4 liver normal albumin 4.1 d-dimer 0.86; mag 1.5; phos 3.3; urine culture: e-coli 12-25-19: wbc 8.2; hgb 7.1; hct 23.5; mcv 57.6 plt 215; glucose 111; bun 8; creat 1.08; k+ 4.2; na++ 135 ca 8.8; mag 1.6 12-26-19: wbc 7.9; hgb 9.5; hct 30.0 mcv 63.6 plt 205; glucose 110; bun 13; creat 1.21; k+ 4.1; na++ 136; ca 9.2 phos 3.4 albumin 3.6  01-01-20: wbc 10.8; hgb 9.9; hct 32.5; mcv 65.4 plt 282; glucose 99; bun 21; creat 1.41; k+ 3.9; na++ 133;ca 9.1 mag 1.9 free t4: 1.37 free t3: 2.1 tsh 7.150  NO NEW LABS.   Physical Exam Constitutional:      General: She is not in acute distress.    Appearance: She is well-developed. She is not diaphoretic.  Neck:     Thyroid: No thyromegaly.     Vascular: Carotid bruit present.  Cardiovascular:     Rate and Rhythm: Normal rate and regular rhythm.     Heart sounds: Normal heart sounds.  Pulmonary:     Effort: Pulmonary effort is normal. No respiratory distress.     Breath sounds: Normal breath sounds.  Abdominal:     General: Bowel sounds are normal. There is no distension.     Palpations: Abdomen is soft.     Tenderness: There is no abdominal tenderness.  Musculoskeletal:        General: Normal range of motion.     Right lower leg: No edema.     Left lower leg: No edema.  Lymphadenopathy:       Cervical: No cervical adenopathy.  Skin:    General: Skin is warm and dry.  Neurological:     Mental Status: She is alert and oriented to person, place, and time.     Comments: Fingers are hyperextended Ataxic movements Poor coordination Poor balance Numbness in bilateral hands and feet        ASSESSMENT/ PLAN:  TODAY  1. Ataxia: is without change in status: will continue therapy as directed and are awaiting neurology consult  2. 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 Type 2 diabetes mellitus with stage 3 chronic kidney disease without long term current use of insulin: is stable will monitor   PREVIOUS   4. Other chronic gastritis without hemorrhage: is without change: will continue prilosec 40 mg daily reglan 5 mg three times daily   5. Hypertension associated with stage 3 chronic kidney disease due to type diabetes mellitus: Is stable b/p 107/64 will continue norvasc 10 mg daily   6. Anemia due to chronic renal failure treated with erythropoietin stage 3: is stable hgb 9.5; will continue therapy through hematology   7.  Increased intraocular pressure bilateral: is stable will continue trusopt to both eyes twice daily and xalatan to both eyes nightly   8. Vitamin B 12 deficiency is stable will continue monthly b 12 injections and folic acid 1 mg daily   9. Failure to thrive in adult: is without change will continue megace 400 mg daily and supplements as indicated   10. Chronic obstructive pulmonary disease unspecified COPD type:  is stable will continue albuterol 2 puffs every 4 hours as needed  11, peripheral neuropathy: is stable will continue gabapentin 100 mg nightly      MD is aware of resident's narcotic use and is in agreement with current plan of care. We will attempt to wean resident as appropriate.  Ok Edwards NP [facility]  Contact [phone] Monday through Friday 8am- 5pm  After hours call  [phone]

## 2020-01-05 ENCOUNTER — Encounter: Payer: Self-pay | Admitting: Adult Health

## 2020-01-05 ENCOUNTER — Ambulatory Visit (HOSPITAL_COMMUNITY)
Admission: RE | Admit: 2020-01-05 | Discharge: 2020-01-05 | Disposition: A | Discharge status: Home / Self Care | Payer: Medicare Other | Source: Ambulatory Visit | Attending: Adult Health | Admitting: Adult Health

## 2020-01-05 ENCOUNTER — Non-Acute Institutional Stay (SKILLED_NURSING_FACILITY): Payer: Medicare Other | Admitting: Adult Health

## 2020-01-05 DIAGNOSIS — R627 Adult failure to thrive: Secondary | ICD-10-CM

## 2020-01-05 DIAGNOSIS — D631 Anemia in chronic kidney disease: Secondary | ICD-10-CM | POA: Diagnosis not present

## 2020-01-05 DIAGNOSIS — H40053 Ocular hypertension, bilateral: Secondary | ICD-10-CM | POA: Insufficient documentation

## 2020-01-05 DIAGNOSIS — I129 Hypertensive chronic kidney disease with stage 1 through stage 4 chronic kidney disease, or unspecified chronic kidney disease: Secondary | ICD-10-CM | POA: Diagnosis not present

## 2020-01-05 DIAGNOSIS — E1122 Type 2 diabetes mellitus with diabetic chronic kidney disease: Secondary | ICD-10-CM | POA: Insufficient documentation

## 2020-01-05 DIAGNOSIS — N183 Chronic kidney disease, stage 3 unspecified: Secondary | ICD-10-CM | POA: Diagnosis not present

## 2020-01-05 DIAGNOSIS — E538 Deficiency of other specified B group vitamins: Secondary | ICD-10-CM | POA: Insufficient documentation

## 2020-01-05 DIAGNOSIS — G111 Early-onset cerebellar ataxia, unspecified: Secondary | ICD-10-CM | POA: Insufficient documentation

## 2020-01-05 DIAGNOSIS — R270 Ataxia, unspecified: Secondary | ICD-10-CM | POA: Diagnosis not present

## 2020-01-05 DIAGNOSIS — N390 Urinary tract infection, site not specified: Secondary | ICD-10-CM | POA: Insufficient documentation

## 2020-01-05 DIAGNOSIS — B9620 Unspecified Escherichia coli [E. coli] as the cause of diseases classified elsewhere: Secondary | ICD-10-CM | POA: Insufficient documentation

## 2020-01-05 NOTE — Progress Notes (Signed)
Location:  [facility] Room Number: 148-W Place of Service:  SNF (31)   CODE STATUS: FULL CODE  Allergies  Allergen Reactions  . Latex Rash  . Levofloxacin Nausea And Vomiting  . Neomycin-Bacitracin-Polymyxin  [Bacitracin-Neomycin-Polymyxin] Rash  . Other Rash and Swelling  . Prednisone Hives, Itching, Rash and Other (See Comments)  . Fish Allergy     rash  . Neosporin [Neomycin-Polymyxin-Gramicidin] Itching and Rash    Chief Complaint  Patient presents with  . Acute Visit    Patient is seen for a Care Plan Meeting    HPI:  [name] BIMS 15/15 mood 12/30. She has been having a 6 month weight loss. With her current weight at 112 pounds. She has not had any falls. She was able to take 2 steps with walker and 2 assist in therapy. She is unable to feed herself.; she has ataxic movements and now has numbness on the left side of her mouth.  Beginning in April/May she had decreased oral intake increased thirst unable to tolerate solid food; had vomiting; and feeling full. At this time her goal is to return back home. We are attempting to get her in to see neurology. She denies any uncontrolled pain. Her appetite remains poor. She continues to be followed for her chronic illnesses including: Anemia due to chronic renal failure treated with erythropoietin stage 3    Failure to thrive in adult  Hypertension associated with stage 3 chronic kidney disease associated with type 2 diabetes mellitus.  Past Medical History:  Diagnosis Date  . Allergy   . Anemia    pernicious anemia  . Asthma   . Blood transfusion without reported diagnosis   . COPD (chronic obstructive pulmonary disease) ([place]) 12/23/2019  . Glaucoma   . History of blood in urine   . Hypertension   . Osteoporosis   . Type 2 diabetes mellitus ([place]) 12/23/2019    Past Surgical History:  Procedure Laterality Date  . CHOLECYSTECTOMY  2008  .  COLONOSCOPY  04/30/2011   Procedure: COLONOSCOPY;  Surgeon: Rogene Houston, MD;  [facility];  Service: Endoscopy;  Laterality: N/A;  . COLONOSCOPY  05/25/2012   Procedure: COLONOSCOPY;  Surgeon: Rogene Houston, MD;  [facility];  Service: Endoscopy;  Laterality: N/A;  1:25-changed to 1200 Ann to notify pt  . COLONOSCOPY Bilateral 12/2017  . ESOPHAGOGASTRODUODENOSCOPY N/A 12/07/2019   Procedure: ESOPHAGOGASTRODUODENOSCOPY (EGD);  Surgeon: Rogene Houston, MD;  [facility];  Service: Endoscopy;  Laterality: N/A;  155  . GALLBLADDER SURGERY    . right breast cystectomy  1988  . TUBAL LIGATION  1975    Social History   Socioeconomic History  . Marital status: Divorced    Spouse name: Not on file  . Number of children: 4  . Years of education: Not on file  . Highest education level: Not on file  Occupational History  . Not on file  Tobacco Use  . Smoking status: Former Smoker    Packs/day: 1.00    Years: 40.00    Pack years: 40.00    Quit date: 04/03/2011    Years since quitting: 8.7  . Smokeless tobacco: Never Used  Vaping Use  . Vaping Use: Never assessed  Substance and Sexual Activity  . Alcohol use: No  . Drug use: No  . Sexual activity: Not Currently  Other Topics Concern  . Not  on file  Social History Narrative  . Not on file   Social Determinants of Health   Financial Resource Strain:   . Difficulty of Paying Living Expenses:   Food Insecurity:   . Worried About Charity fundraiser in the Last Year:   . Arboriculturist in the Last Year:   Transportation Needs:   . Film/video editor (Medical):   Marland Kitchen Lack of Transportation (Non-Medical):   Physical Activity:   . Days of Exercise per Week:   . Minutes of Exercise per Session:   Stress:   . Feeling of Stress :   Social Connections:   . Frequency of Communication with Friends and Family:   . Frequency of Social Gatherings with Friends and Family:   . Attends Religious  Services:   . Active Member of Clubs or Organizations:   . Attends Archivist Meetings:   Marland Kitchen Marital Status:   Intimate Partner Violence:   . Fear of Current or Ex-Partner:   . Emotionally Abused:   Marland Kitchen Physically Abused:   . Sexually Abused:    Family History  Problem Relation Age of Onset  . Diabetes Mother   . Hyperlipidemia Mother   . Hypertension Mother   . Heart disease Father   . Hyperlipidemia Father   . Diabetes Sister   . Hyperlipidemia Sister   . Hypertension Sister   . Cancer Brother   . Heart disease Brother   . Hyperlipidemia Brother   . Hypertension Brother   . Diabetes Sister       VITAL SIGNS BP 107/64   Pulse 99   Temp 97.7 F (36.5 C) (Oral)   Resp 18   Ht 5\' 4"  (1.626 m)   Wt 112 lb 3.2 oz (50.9 kg)   SpO2 93%   BMI 19.26 kg/m   Outpatient Encounter Medications as of 01/05/2020  Medication Sig  . acetaminophen (TYLENOL) 325 MG tablet Take 2 tablets (650 mg total) by mouth every 6 (six) hours as needed for mild pain (or Fever >/= 101).  Marland Kitchen albuterol (VENTOLIN HFA) 108 (90 Base) MCG/ACT inhaler Inhale 2 puffs into the lungs every 4 (four) hours as needed for wheezing or shortness of breath.  Marland Kitchen amLODipine (NORVASC) 10 MG tablet Take 1 tablet (10 mg total) by mouth daily.  . cyanocobalamin (,VITAMIN B-12,) 1000 MCG/ML injection Inject 1 mL (1,000 mcg total) into the muscle every 30 (thirty) days.  . dorzolamide (TRUSOPT) 2 % ophthalmic solution Place 1 drop into both eyes 2 (two) times daily.  . feeding supplement, ENSURE ENLIVE, (ENSURE ENLIVE) LIQD Take 237 mLs by mouth 2 (two) times daily between meals.  . folic acid (FOLVITE) 1 MG tablet Take 1 mg by mouth daily.   Marland Kitchen gabapentin (NEURONTIN) 100 MG capsule Take 100 mg by mouth at bedtime.  Marland Kitchen latanoprost (XALATAN) 0.005 % ophthalmic solution Place 1 drop into both eyes at bedtime.  . megestrol (MEGACE) 400 MG/10ML suspension Take 10 mLs (400 mg total) by mouth daily.  . metoCLOPramide  (REGLAN) 5 MG tablet Take 1 tablet (5 mg total) by mouth 3 (three) times daily before meals.  . NON FORMULARY Diet Change: Pureed with thin liquids.  Marland Kitchen omeprazole (PRILOSEC) 40 MG capsule Take 40 mg by mouth daily.   . ondansetron (ZOFRAN ODT) 4 MG disintegrating tablet Take 1 tablet (4 mg total) by mouth every 8 (eight) hours as needed for nausea or vomiting. Dissolve under tongue   No facility-administered encounter  medications on file as of 01/05/2020.     SIGNIFICANT DIAGNOSTIC EXAMS   PREVIOUS   12-23-19: chest x-ray: No active disease.   12-23-19: ct angio of chest:  1. No evidence of pulmonary embolism. 2. Paraseptal emphysema, mild and worse at the lung apices. 3. 3 mm nodule along the fissure in the RIGHT lower lobe. . 4. Moderate and with 3.6 cm caliber of ascending thoracic aorta.. Aortic aneurysm  5. Emphysema and aortic atherosclerosis.   NO NEW EXAMS   LABS REVIEWED PREVIOUS   12-23-19: wbc 8.2; hgb 7.3; hct 25.2; mcv 58.3 plt 220; glucose 114; bun 15; creat 1.46; k+ 3.2; na++ 137 ca 9.4 liver normal albumin 4.1 d-dimer 0.86; mag 1.5; phos 3.3; urine culture: e-coli 12-25-19: wbc 8.2; hgb 7.1; hct 23.5; mcv 57.6 plt 215; glucose 111; bun 8; creat 1.08; k+ 4.2; na++ 135 ca 8.8; mag 1.6 12-26-19: wbc 7.9; hgb 9.5; hct 30.0 mcv 63.6 plt 205; glucose 110; bun 13; creat 1.21; k+ 4.1; na++ 136; ca 9.2 phos 3.4 albumin 3.6  01-01-20: wbc 10.8; hgb 9.9; hct 32.5; mcv 65.4 plt 282; glucose 99; bun 21; creat 1.41; k+ 3.9; na++ 133;ca 9.1 mag 1.9 free t4: 1.37 free t3: 2.1 tsh 7.150  NO NEW LABS.   Review of Systems  Constitutional: Negative for malaise/fatigue.  Respiratory: Negative for cough and shortness of breath.   Cardiovascular: Negative for chest pain, palpitations and leg swelling.  Gastrointestinal: Negative for abdominal pain, constipation and heartburn.  Musculoskeletal: Negative for back pain, joint pain and myalgias.  Skin: Negative.   Neurological: Positive for  tingling, sensory change, focal weakness and weakness. Negative for dizziness.  Psychiatric/Behavioral: The patient is not nervous/anxious.    Physical Exam Constitutional:      General: She is not in acute distress.    Appearance: She is underweight. She is not diaphoretic.  Neck:     Thyroid: No thyromegaly.  Cardiovascular:     Rate and Rhythm: Normal rate and regular rhythm.     Pulses: Normal pulses.     Heart sounds: Normal heart sounds.  Pulmonary:     Effort: Pulmonary effort is normal. No respiratory distress.     Breath sounds: Normal breath sounds.  Abdominal:     General: Bowel sounds are normal. There is no distension.     Palpations: Abdomen is soft.     Tenderness: There is no abdominal tenderness.  Musculoskeletal:        General: Normal range of motion.     Cervical back: Neck supple.     Right lower leg: No edema.     Left lower leg: No edema.     Comments: Fingers are hyperextended Ataxic movements Poor coordination Poor balance Numbness in bilateral hands and feet    Lymphadenopathy:     Cervical: No cervical adenopathy.  Skin:    General: Skin is warm and dry.  Neurological:     Mental Status: She is alert and oriented to person, place, and time.  Psychiatric:        Mood and Affect: Mood normal.       ASSESSMENT/ PLAN:  TODAY  1. Anemia due to chronic renal failure treated with erythropoietin stage 3 2.  Failure to thrive in adult  3. Hypertension associated with stage 3 chronic kidney disease associated with type 2 diabetes mellitus.   Will continue therapy as directed Will continue current medications Will continue current plan of care Awaiting neurology consult Her goal remains  to return home.   MD is aware of resident's narcotic use and is in agreement with current plan of care. We will attempt to wean resident as appropriate.  Ok Edwards NP [facility]  Contact [phone] Monday through Friday 8am- 5pm  After  hours call [phone]

## 2020-01-08 ENCOUNTER — Other Ambulatory Visit (HOSPITAL_COMMUNITY)
Admission: RE | Admit: 2020-01-08 | Discharge: 2020-01-08 | Disposition: A | Discharge status: Home / Self Care | Payer: Medicare Other | Source: Skilled Nursing Facility | Attending: Adult Health | Admitting: Adult Health

## 2020-01-08 DIAGNOSIS — G629 Polyneuropathy, unspecified: Secondary | ICD-10-CM | POA: Insufficient documentation

## 2020-01-08 DIAGNOSIS — E069 Thyroiditis, unspecified: Secondary | ICD-10-CM | POA: Insufficient documentation

## 2020-01-08 DIAGNOSIS — I1310 Hypertensive heart and chronic kidney disease without heart failure, with stage 1 through stage 4 chronic kidney disease, or unspecified chronic kidney disease: Secondary | ICD-10-CM | POA: Insufficient documentation

## 2020-01-08 LAB — SEDIMENTATION RATE: Sed Rate: 61 mm/hr — ABNORMAL HIGH (ref 0–22)

## 2020-01-08 LAB — C-REACTIVE PROTEIN: CRP: 15.6 mg/dL — ABNORMAL HIGH (ref <1.0)

## 2020-01-09 ENCOUNTER — Encounter (HOSPITAL_COMMUNITY)
Admission: RE | Admit: 2020-01-09 | Discharge: 2020-01-09 | Disposition: A | Discharge status: Home / Self Care | Payer: Medicare Other | Source: Skilled Nursing Facility | Attending: Adult Health | Admitting: Adult Health

## 2020-01-09 DIAGNOSIS — R270 Ataxia, unspecified: Secondary | ICD-10-CM | POA: Insufficient documentation

## 2020-01-09 LAB — CBC WITH DIFFERENTIAL/PLATELET
Abs Immature Granulocytes: 0.04 10*3/uL (ref 0.00–0.07)
Basophils Absolute: 0 10*3/uL (ref 0.0–0.1)
Basophils Relative: 0 %
Eosinophils Absolute: 0.2 10*3/uL (ref 0.0–0.5)
Eosinophils Relative: 3 %
HCT: 25.9 % — ABNORMAL LOW (ref 36.0–46.0)
Hemoglobin: 8.2 g/dL — ABNORMAL LOW (ref 12.0–15.0)
Immature Granulocytes: 1 %
Lymphocytes Relative: 21 %
Lymphs Abs: 1.7 10*3/uL (ref 0.7–4.0)
MCH: 21.1 pg — ABNORMAL LOW (ref 26.0–34.0)
MCHC: 31.7 g/dL (ref 30.0–36.0)
MCV: 66.6 fL — ABNORMAL LOW (ref 80.0–100.0)
Monocytes Absolute: 0.6 10*3/uL (ref 0.1–1.0)
Monocytes Relative: 7 %
Neutro Abs: 5.6 10*3/uL (ref 1.7–7.7)
Neutrophils Relative %: 68 %
Platelets: 238 10*3/uL (ref 150–400)
RBC: 3.89 MIL/uL (ref 3.87–5.11)
RDW: 33.9 % — ABNORMAL HIGH (ref 11.5–15.5)
WBC: 8.2 10*3/uL (ref 4.0–10.5)
nRBC: 0 % (ref 0.0–0.2)

## 2020-01-09 LAB — ANA: Anti Nuclear Antibody (ANA): NEGATIVE

## 2020-01-10 ENCOUNTER — Inpatient Hospital Stay (HOSPITAL_COMMUNITY): Payer: Medicare Other

## 2020-01-10 ENCOUNTER — Other Ambulatory Visit (HOSPITAL_COMMUNITY)
Admission: RE | Admit: 2020-01-10 | Discharge: 2020-01-10 | Disposition: A | Discharge status: Home / Self Care | Payer: Medicare Other | Source: Skilled Nursing Facility | Attending: Internal Medicine | Admitting: Internal Medicine

## 2020-01-10 ENCOUNTER — Encounter (HOSPITAL_COMMUNITY): Payer: Self-pay

## 2020-01-10 VITALS — BP 122/70 | HR 102 | Temp 97.4°F | Resp 16

## 2020-01-10 DIAGNOSIS — E538 Deficiency of other specified B group vitamins: Secondary | ICD-10-CM | POA: Diagnosis not present

## 2020-01-10 DIAGNOSIS — D631 Anemia in chronic kidney disease: Secondary | ICD-10-CM | POA: Diagnosis not present

## 2020-01-10 DIAGNOSIS — E860 Dehydration: Secondary | ICD-10-CM

## 2020-01-10 DIAGNOSIS — G6289 Other specified polyneuropathies: Secondary | ICD-10-CM | POA: Insufficient documentation

## 2020-01-10 DIAGNOSIS — N189 Chronic kidney disease, unspecified: Secondary | ICD-10-CM | POA: Diagnosis not present

## 2020-01-10 DIAGNOSIS — D509 Iron deficiency anemia, unspecified: Secondary | ICD-10-CM

## 2020-01-10 LAB — CBC WITH DIFFERENTIAL/PLATELET
Abs Immature Granulocytes: 0.05 10*3/uL (ref 0.00–0.07)
Basophils Absolute: 0 10*3/uL (ref 0.0–0.1)
Basophils Relative: 0 %
Eosinophils Absolute: 0.3 10*3/uL (ref 0.0–0.5)
Eosinophils Relative: 3 %
HCT: 28.2 % — ABNORMAL LOW (ref 36.0–46.0)
Hemoglobin: 8.9 g/dL — ABNORMAL LOW (ref 12.0–15.0)
Immature Granulocytes: 1 %
Lymphocytes Relative: 19 %
Lymphs Abs: 1.9 10*3/uL (ref 0.7–4.0)
MCH: 21.3 pg — ABNORMAL LOW (ref 26.0–34.0)
MCHC: 31.6 g/dL (ref 30.0–36.0)
MCV: 67.6 fL — ABNORMAL LOW (ref 80.0–100.0)
Monocytes Absolute: 0.8 10*3/uL (ref 0.1–1.0)
Monocytes Relative: 8 %
Neutro Abs: 7.2 10*3/uL (ref 1.7–7.7)
Neutrophils Relative %: 69 %
Platelets: 290 10*3/uL (ref 150–400)
RBC: 4.17 MIL/uL (ref 3.87–5.11)
WBC: 10.2 10*3/uL (ref 4.0–10.5)
nRBC: 0 % (ref 0.0–0.2)

## 2020-01-10 LAB — COMPREHENSIVE METABOLIC PANEL
ALT: 22 U/L (ref 0–44)
AST: 21 U/L (ref 15–41)
Albumin: 3.4 g/dL — ABNORMAL LOW (ref 3.5–5.0)
Alkaline Phosphatase: 64 U/L (ref 38–126)
Anion gap: 12 (ref 5–15)
BUN: 24 mg/dL — ABNORMAL HIGH (ref 8–23)
CO2: 22 mmol/L (ref 22–32)
Calcium: 9.2 mg/dL (ref 8.9–10.3)
Chloride: 94 mmol/L — ABNORMAL LOW (ref 98–111)
Creatinine, Ser: 1.59 mg/dL — ABNORMAL HIGH (ref 0.44–1.00)
GFR calc Af Amer: 36 mL/min — ABNORMAL LOW (ref >60)
GFR calc non Af Amer: 31 mL/min — ABNORMAL LOW (ref >60)
Glucose, Bld: 117 mg/dL — ABNORMAL HIGH (ref 70–99)
Potassium: 4.2 mmol/L (ref 3.5–5.1)
Sodium: 128 mmol/L — ABNORMAL LOW (ref 135–145)
Total Bilirubin: 0.4 mg/dL (ref 0.3–1.2)
Total Protein: 7 g/dL (ref 6.5–8.1)

## 2020-01-10 LAB — MAGNESIUM: Magnesium: 1.9 mg/dL (ref 1.7–2.4)

## 2020-01-10 MED ORDER — EPOETIN ALFA-EPBX 10000 UNIT/ML IJ SOLN
10000.0000 [IU] | Freq: Once | INTRAMUSCULAR | Status: AC
  Administered 2020-01-10: 10000 [IU] via SUBCUTANEOUS

## 2020-01-10 MED ORDER — EPOETIN ALFA-EPBX 10000 UNIT/ML IJ SOLN
INTRAMUSCULAR | Status: AC
  Filled 2020-01-10: qty 1

## 2020-01-11 ENCOUNTER — Encounter: Payer: Self-pay | Admitting: Adult Health

## 2020-01-11 ENCOUNTER — Non-Acute Institutional Stay (SKILLED_NURSING_FACILITY): Payer: Medicare Other | Admitting: Adult Health

## 2020-01-11 DIAGNOSIS — D631 Anemia in chronic kidney disease: Secondary | ICD-10-CM

## 2020-01-11 DIAGNOSIS — N183 Chronic kidney disease, stage 3 unspecified: Secondary | ICD-10-CM

## 2020-01-11 DIAGNOSIS — E1122 Type 2 diabetes mellitus with diabetic chronic kidney disease: Secondary | ICD-10-CM | POA: Diagnosis not present

## 2020-01-11 DIAGNOSIS — K2950 Unspecified chronic gastritis without bleeding: Secondary | ICD-10-CM | POA: Diagnosis not present

## 2020-01-11 DIAGNOSIS — I129 Hypertensive chronic kidney disease with stage 1 through stage 4 chronic kidney disease, or unspecified chronic kidney disease: Secondary | ICD-10-CM

## 2020-01-11 LAB — RPR: RPR Ser Ql: NONREACTIVE

## 2020-01-11 NOTE — Progress Notes (Signed)
Location:    [place] Room Number: 148/W Place of Service:  SNF (31)   CODE STATUS: Full Code  Allergies  Allergen Reactions  . Latex Rash  . Levofloxacin Nausea And Vomiting  . Neomycin-Bacitracin-Polymyxin  [Bacitracin-Neomycin-Polymyxin] Rash  . Other Rash and Swelling  . Prednisone Hives, Itching, Rash and Other (See Comments)  . Fish Allergy     rash  . Neosporin [Neomycin-Polymyxin-Gramicidin] Itching and Rash    Chief Complaint  Patient presents with  . Acute Visit    Care Plan Meeting  . Short Term Rehab         Other chronic gastritis without hemorrhage:   Hypertension associated with stage 3 chronic kidney disease due to type 2 diabetes mellitus:    Anemia due ot chronic renal failure treated with erythropoietin stage 3:  Weekly follow up for the first 30 days post hospitalization.     HPI:  She is a 74 year old short term rehab patient being seen for the management of her chronic illnesses:Other chronic gastritis without hemorrhage: Hypertension associated with stage 3 chronic kidney disease due to type 2 diabetes mellitus:   Anemia due ot chronic renal failure treated with erythropoietin stage 3: . She denies any uncontrolled pain; her appetite remains poor to fair. There are no reports of constipation. She continues to participate in therapy   [name] BIMS 15/15 mood 12/30. No falls weight is stable. Is standing and is ambulating 20 feet with walker and 2 max assist. She continues to be total care for her adls and requires assist with meals. Still has ataxic movements. She is due to neurology 01-17-20. Her goal is to return back home she continues to be followed for her chronic illnesses including; Other chronic gastritis without hemorrhage:  Hypertension associated with stage 3 chronic kidney disease due to type 2 diabetes mellitus:  Anemia due ot chronic renal failure treated with  erythropoietin stage 3:   Past Medical History:  Diagnosis Date  . Allergy   . Anemia    pernicious anemia  . Asthma   . Blood transfusion without reported diagnosis   . COPD (chronic obstructive pulmonary disease) ([place]) 12/23/2019  . Glaucoma   . History of blood in urine   . Hypertension   . Osteoporosis   . Type 2 diabetes mellitus ([place]) 12/23/2019    Past Surgical History:  Procedure Laterality Date  . CHOLECYSTECTOMY  2008  . COLONOSCOPY  04/30/2011   Procedure: COLONOSCOPY;  Surgeon: Rogene Houston, MD;  [facility];  Service: Endoscopy;  Laterality: N/A;  . COLONOSCOPY  05/25/2012   Procedure: COLONOSCOPY;  Surgeon: Rogene Houston, MD;  [facility];  Service: Endoscopy;  Laterality: N/A;  1:25-changed to 1200 Ann to notify pt  . COLONOSCOPY Bilateral 12/2017  . ESOPHAGOGASTRODUODENOSCOPY N/A 12/07/2019   Procedure: ESOPHAGOGASTRODUODENOSCOPY (EGD);  Surgeon: Rogene Houston, MD;  [facility];  Service: Endoscopy;  Laterality: N/A;  155  . GALLBLADDER SURGERY    . right breast cystectomy  1988  . TUBAL LIGATION  1975    Social History   Socioeconomic History  . Marital status: Divorced    Spouse name: Not on file  . Number of children: 4  . Years of education: Not on file  . Highest education level: Not on file  Occupational History  . Not on file  Tobacco Use  . Smoking status:  Former Smoker    Packs/day: 1.00    Years: 40.00    Pack years: 40.00    Quit date: 04/03/2011    Years since quitting: 8.7  . Smokeless tobacco: Never Used  Vaping Use  . Vaping Use: Never assessed  Substance and Sexual Activity  . Alcohol use: No  . Drug use: No  . Sexual activity: Not Currently  Other Topics Concern  . Not on file  Social History Narrative  . Not on file   Social Determinants of Health   Financial Resource Strain:   . Difficulty of Paying Living Expenses:   Food Insecurity:   . Worried About Charity fundraiser in  the Last Year:   . Arboriculturist in the Last Year:   Transportation Needs:   . Film/video editor (Medical):   Marland Kitchen Lack of Transportation (Non-Medical):   Physical Activity:   . Days of Exercise per Week:   . Minutes of Exercise per Session:   Stress:   . Feeling of Stress :   Social Connections:   . Frequency of Communication with Friends and Family:   . Frequency of Social Gatherings with Friends and Family:   . Attends Religious Services:   . Active Member of Clubs or Organizations:   . Attends Archivist Meetings:   Marland Kitchen Marital Status:   Intimate Partner Violence:   . Fear of Current or Ex-Partner:   . Emotionally Abused:   Marland Kitchen Physically Abused:   . Sexually Abused:    Family History  Problem Relation Age of Onset  . Diabetes Mother   . Hyperlipidemia Mother   . Hypertension Mother   . Heart disease Father   . Hyperlipidemia Father   . Diabetes Sister   . Hyperlipidemia Sister   . Hypertension Sister   . Cancer Brother   . Heart disease Brother   . Hyperlipidemia Brother   . Hypertension Brother   . Diabetes Sister       VITAL SIGNS BP 119/75   Pulse (!) 103   Temp 98 F (36.7 C) (Oral)   Resp 20   Ht 5\' 4"  (1.626 m)   Wt 112 lb 3.2 oz (50.9 kg)   SpO2 93%   BMI 19.26 kg/m   Outpatient Encounter Medications as of 01/11/2020  Medication Sig  . acetaminophen (TYLENOL) 325 MG tablet Take 2 tablets (650 mg total) by mouth every 6 (six) hours as needed for mild pain (or Fever >/= 101).  Marland Kitchen albuterol (VENTOLIN HFA) 108 (90 Base) MCG/ACT inhaler Inhale 2 puffs into the lungs every 4 (four) hours as needed for wheezing or shortness of breath.  Marland Kitchen amLODipine (NORVASC) 10 MG tablet Take 1 tablet (10 mg total) by mouth daily.  . cyanocobalamin (,VITAMIN B-12,) 1000 MCG/ML injection Inject 1 mL (1,000 mcg total) into the muscle every 30 (thirty) days.  . dorzolamide (TRUSOPT) 2 % ophthalmic solution Place 1 drop into both eyes 2 (two) times daily.  .  feeding supplement, ENSURE ENLIVE, (ENSURE ENLIVE) LIQD Take 237 mLs by mouth 2 (two) times daily between meals.  . folic acid (FOLVITE) 1 MG tablet Take 1 mg by mouth daily.   Marland Kitchen gabapentin (NEURONTIN) 100 MG capsule Take 100 mg by mouth at bedtime.  Marland Kitchen latanoprost (XALATAN) 0.005 % ophthalmic solution Place 1 drop into both eyes at bedtime.  . megestrol (MEGACE) 400 MG/10ML suspension Take 10 mLs (400 mg total) by mouth daily.  . metoCLOPramide (REGLAN) 5  MG tablet Take 1 tablet (5 mg total) by mouth 3 (three) times daily before meals.  . NON FORMULARY Diet Change: Dys 2 (ground), continue thin liquids; add extra gravy to meats  . omeprazole (PRILOSEC) 40 MG capsule Take 40 mg by mouth daily.   . ondansetron (ZOFRAN ODT) 4 MG disintegrating tablet Take 1 tablet (4 mg total) by mouth every 8 (eight) hours as needed for nausea or vomiting. Dissolve under tongue   No facility-administered encounter medications on file as of 01/11/2020.     SIGNIFICANT DIAGNOSTIC EXAMS   PREVIOUS   12-23-19: chest x-ray: No active disease.   12-23-19: ct angio of chest:  1. No evidence of pulmonary embolism. 2. Paraseptal emphysema, mild and worse at the lung apices. 3. 3 mm nodule along the fissure in the RIGHT lower lobe. . 4. Moderate and with 3.6 cm caliber of ascending thoracic aorta.. Aortic aneurysm  5. Emphysema and aortic atherosclerosis.   NO NEW EXAMS   LABS REVIEWED PREVIOUS   12-23-19: wbc 8.2; hgb 7.3; hct 25.2; mcv 58.3 plt 220; glucose 114; bun 15; creat 1.46; k+ 3.2; na++ 137 ca 9.4 liver normal albumin 4.1 d-dimer 0.86; mag 1.5; phos 3.3; urine culture: e-coli 12-25-19: wbc 8.2; hgb 7.1; hct 23.5; mcv 57.6 plt 215; glucose 111; bun 8; creat 1.08; k+ 4.2; na++ 135 ca 8.8; mag 1.6 12-26-19: wbc 7.9; hgb 9.5; hct 30.0 mcv 63.6 plt 205; glucose 110; bun 13; creat 1.21; k+ 4.1; na++ 136; ca 9.2 phos 3.4 albumin 3.6  01-01-20: wbc 10.8; hgb 9.9; hct 32.5; mcv 65.4 plt 282; glucose 99; bun 21; creat  1.41; k+ 3.9; na++ 133;ca 9.1 mag 1.9 free t4: 1.37 free t3: 2.1 tsh 7.150  TODAY  01-08-20: ANA neg; CRP 15.6 01-09-20: wbc 8.2; hgb 8.2; hct 25.9; mcv 66.6 plt 238;  01-10-20: wbc 10.2; hgb 8.9; hct 28.2; mcv 67.6 plt 290; gucose 117; bun 25; creat 1.59; k+ 4.2; na++ 138; ca 9.2; live normal albumin 3.4 mag 1.9 RPR neg   Review of Systems  Constitutional: Negative for malaise/fatigue.  Respiratory: Negative for cough and shortness of breath.   Cardiovascular: Negative for chest pain, palpitations and leg swelling.  Gastrointestinal: Negative for abdominal pain, constipation and heartburn.  Musculoskeletal: Negative for back pain, joint pain and myalgias.  Skin: Negative.   Neurological: Positive for tingling, sensory change, focal weakness and weakness. Negative for dizziness.  Psychiatric/Behavioral: The patient is not nervous/anxious.    Physical Exam Constitutional:      General: She is not in acute distress.    Appearance: She is well-developed and underweight. She is not diaphoretic.  Neck:     Thyroid: No thyromegaly.  Cardiovascular:     Rate and Rhythm: Normal rate and regular rhythm.     Pulses: Normal pulses.     Heart sounds: Normal heart sounds.  Pulmonary:     Effort: Pulmonary effort is normal. No respiratory distress.     Breath sounds: Normal breath sounds.  Abdominal:     General: Bowel sounds are normal. There is no distension.     Palpations: Abdomen is soft.     Tenderness: There is no abdominal tenderness.  Musculoskeletal:        General: Normal range of motion.     Cervical back: Neck supple.     Right lower leg: No edema.     Left lower leg: No edema.     Comments: Fingers are hyperextended Ataxic movements Poor coordination Poor  balance Numbness in bilateral hands and feet     Lymphadenopathy:     Cervical: No cervical adenopathy.  Skin:    General: Skin is warm and dry.  Neurological:     Mental Status: She is alert and oriented to person,  place, and time.  Psychiatric:        Mood and Affect: Mood normal.       ASSESSMENT/ PLAN:  TODAY  1. Other chronic gastritis without hemorrhage: is stable will continue prilosec 40 mg daily reglan 5 mg three times daily   2. Hypertension associated with stage 3 chronic kidney disease due to type 2 diabetes mellitus: is stable b/p 119/75 will continue norvasc 5 mg daily  3. Anemia due ot chronic renal failure treated with erythropoietin stage 3: is table hgb 8.9 will continue therapy per hematology     PREVIOUS   4.  Increased intraocular pressure bilateral: is stable will continue trusopt to both eyes twice daily and xalatan to both eyes nightly   5. Vitamin B 12 deficiency is stable will continue monthly b 12 injections and folic acid 1 mg daily   6. Failure to thrive in adult: is without change will continue megace 400 mg daily and supplements as indicated   7. Chronic obstructive pulmonary disease unspecified COPD type: is stable will continue albuterol 2 puffs every 4 hours as needed  8, peripheral neuropathy: is stable will continue gabapentin 100 mg nightly   9. Ataxia: is without change in status: will continue therapy as directed and are awaiting neurology consult  10. CKD stage 3 due to diabetes mellitus type 2: is stable bun 24; creat 1.59  11. Type 2 diabetes mellitus with stage 3 chronic kidney disease without long term current use of insulin: is stable will monitor     MD is aware of resident's narcotic use and is in agreement with current plan of care. We will attempt to wean resident as appropriate.  Ok Edwards NP [facility]  Contact [phone] Monday through Friday 8am- 5pm  After hours call [phone]

## 2020-01-12 NOTE — H&P (Signed)
[facility] History & Physical  Rob Doctor, hospital MD  Traci Parker 12/18/2019  Chief Complaint: Nausea, vomiting HPI: The patient is a 74 y.o. year-old w/ hx of HTN, glaucoma, DM (not on medication), anemia, asthma presenting to ED w/ c/o N/V for days to weeks.  Pt was sent from PCP's office to ED for evaluation. Also h/o ongoing wt loss.  F/b GI, Dr Laural Golden, ED spoke w/ him and they will see patient in am. In ED labs okay except for slight creat up at 1.9 from baseline 1.3.  Giving IVF's.  Asked to admit for dehydration and for further evaluation of refractory N/V.   Pt seen in ED, gives hx of nausea/ vomiting for about 2-3 mos, has lost about 30 lbs of weight, was 144lbs and now 113 lb or so.  Was seen and has EGD recently by GI on 5/27 which showed  - Normal hypopharynx. - Normal esophagus. - Z-line regular, 38 cm from the incisors. - Gastritis. Biopsied. - Normal pylorus. - Normal duodenal bulb and second portion of the duodenum. CT abd also showed thickening of stomach wall usually c/w gastritis as seen by EGD.   Pt reports palpitations and some lightheadness w/ exertion.  Has been to ED a few times for IVF's and she typically feels better for a short time then.  Nausea/ emesis happens w/ all different food types and most liquids. Able to keep some water down.  Hx of lap cholecystectomy 2008, no other abd surgery. NO abd pain or diarrhea.   ROS  denies CP  no joint pain   no HA  no blurry vision  no rash  no diarrhea  no dysuria  no difficulty voiding  no change in urine color   Past Medical History  Past Medical History:  Diagnosis Date  . Allergy   . Anemia    pernicious anemia  . Asthma   . Blood transfusion without reported diagnosis   . COPD (chronic obstructive pulmonary disease) ([place]) 12/23/2019  . Glaucoma   . History of blood in urine   . Hypertension   . Osteoporosis   . Type 2 diabetes mellitus ([place]) 12/23/2019   Past Surgical History  Past  Surgical History:  Procedure Laterality Date  . CHOLECYSTECTOMY  2008  . COLONOSCOPY  04/30/2011   Procedure: COLONOSCOPY;  Surgeon: Rogene Houston, MD;  [facility];  Service: Endoscopy;  Laterality: N/A;  . COLONOSCOPY  05/25/2012   Procedure: COLONOSCOPY;  Surgeon: Rogene Houston, MD;  [facility];  Service: Endoscopy;  Laterality: N/A;  1:25-changed to 1200 Ann to notify pt  . COLONOSCOPY Bilateral 12/2017  . ESOPHAGOGASTRODUODENOSCOPY N/A 12/07/2019   Procedure: ESOPHAGOGASTRODUODENOSCOPY (EGD);  Surgeon: Rogene Houston, MD;  [facility];  Service: Endoscopy;  Laterality: N/A;  155  . GALLBLADDER SURGERY    . right breast cystectomy  1988  . TUBAL LIGATION  1975   Family History  Family History  Problem Relation Age of Onset  . Diabetes Mother   . Hyperlipidemia Mother   . Hypertension Mother   . Heart disease Father   . Hyperlipidemia Father   . Diabetes Sister   . Hyperlipidemia Sister   . Hypertension Sister   . Cancer Brother   . Heart disease Brother   . Hyperlipidemia Brother   . Hypertension Brother   . Diabetes Sister    Social History  reports that she quit smoking about 8 years ago. She  has a 40.00 pack-year smoking history. She has never used smokeless tobacco. She reports that she does not drink alcohol and does not use drugs. Allergies  Allergies  Allergen Reactions  . Latex Rash  . Levofloxacin Nausea And Vomiting  . Neomycin-Bacitracin-Polymyxin  [Bacitracin-Neomycin-Polymyxin] Rash  . Other Rash and Swelling  . Prednisone Hives, Itching, Rash and Other (See Comments)  . Fish Allergy     rash  . Neosporin [Neomycin-Polymyxin-Gramicidin] Itching and Rash   Home medications Prior to Admission medications   Medication Sig Start Date End Date Taking? Authorizing Provider  albuterol (VENTOLIN HFA) 108 (90 Base) MCG/ACT inhaler Inhale 2 puffs into the lungs every 4 (four) hours as needed for wheezing or shortness of  breath. 11/21/19  Yes Lockamy, Randi L, NP-C  cyanocobalamin (,VITAMIN B-12,) 1000 MCG/ML injection Inject 1 mL (1,000 mcg total) into the muscle every 30 (thirty) days. 05/30/19  Yes Corum, Rex Kras, MD  dorzolamide (TRUSOPT) 2 % ophthalmic solution Place 1 drop into both eyes 2 (two) times daily.   Yes [provider]  folic acid (FOLVITE) 1 MG tablet Take 1 mg by mouth daily.  05/31/19  Yes [provider]  latanoprost (XALATAN) 0.005 % ophthalmic solution Place 1 drop into both eyes at bedtime.   Yes [provider]  acetaminophen (TYLENOL) 325 MG tablet Take 2 tablets (650 mg total) by mouth every 6 (six) hours as needed for mild pain (or Fever >/= 101). 12/27/19   Roxan Hockey, MD  amLODipine (NORVASC) 10 MG tablet Take 1 tablet (10 mg total) by mouth daily. 12/27/19   Roxan Hockey, MD  feeding supplement, ENSURE ENLIVE, (ENSURE ENLIVE) LIQD Take 237 mLs by mouth 2 (two) times daily between meals. 12/27/19   [provider]  gabapentin (NEURONTIN) 100 MG capsule Take 100 mg by mouth at bedtime. 01/03/20   [provider]  megestrol (MEGACE) 400 MG/10ML suspension Take 10 mLs (400 mg total) by mouth daily. 12/28/19   Roxan Hockey, MD  metoCLOPramide (REGLAN) 5 MG tablet Take 1 tablet (5 mg total) by mouth 3 (three) times daily before meals. 12/27/19 12/26/20  Roxan Hockey, MD  NON FORMULARY Diet Change: Dys 2 (ground), continue thin liquids; add extra gravy to meats 01/08/20   [provider]  omeprazole (PRILOSEC) 40 MG capsule Take 40 mg by mouth daily.  10/30/19   [provider]  ondansetron (ZOFRAN ODT) 4 MG disintegrating tablet Take 1 tablet (4 mg total) by mouth every 8 (eight) hours as needed for nausea or vomiting. Dissolve under tongue 11/21/19   Lockamy, Randi L, NP-C        Exam Gen alert, thin AAF,  No distress, calm, lying at 30 deg No rash, cyanosis or gangrene Sclera anicteric, throat clear and moist  No  jvd or bruits Chest clear bilat to bases RRR no MRG, tachy Abd soft ntnd no mass or ascites +bs GU defer MS no joint effusions or deformity Ext no LE or UE edema, no wounds or ulcers Neuro is alert, Ox 3 , nf No bruits in neck, abdomen, groin    Home meds:  - norvasc 10  - epo 3000u q 14d  - prilosec 40   - prn's/ vitamins/ supplements/ eyedrops     CXR - IMPRESSION: No active disease.   Assessment/ Plan: 1. Wt loss/ refractory nausea and vomiting - going on for 2-3 mos and lost 30 lbs. GI will see in am, they are familiar w/ the patient I  am told.  Give IVF's, repeat labs in am.  So far EGD and CT have shown findings c/w gastritis.  Per GI.   2. AoCKD 3 - b/l creat 1.3 (egfr 48m/min) from Feb 2021. Creat here 1.95, suspect vol depletion. Giving IVF's. Repeat bmet in am.  3. HTN - BP's low to normal, hold norvasc for now 4. Anemia - not sure cause, pt can't remember. ? Hx of pernicious anemia per pt but B12 here is highly elevated. MCV very low 57 but iron levels are not low. Hb 8-9 range. Consider hematology referral.      RKelly Splinter MD   [facility] 12/18/2019, 5:38 PM

## 2020-01-16 ENCOUNTER — Encounter: Payer: Self-pay | Admitting: *Deleted

## 2020-01-16 ENCOUNTER — Encounter (HOSPITAL_COMMUNITY): Payer: Medicare Other

## 2020-01-16 ENCOUNTER — Encounter (HOSPITAL_COMMUNITY)
Admit: 2020-01-16 | Discharge: 2020-01-16 | Disposition: A | Discharge status: Home / Self Care | Payer: Medicare Other | Attending: Nephrology | Admitting: Nephrology

## 2020-01-17 ENCOUNTER — Encounter: Payer: Self-pay | Admitting: Diagnostic Neuroimaging

## 2020-01-17 ENCOUNTER — Ambulatory Visit (INDEPENDENT_AMBULATORY_CARE_PROVIDER_SITE_OTHER): Payer: Medicare Other | Admitting: Diagnostic Neuroimaging

## 2020-01-17 VITALS — BP 112/72 | HR 96 | Ht 64.0 in | Wt 111.0 lb

## 2020-01-17 DIAGNOSIS — R6889 Other general symptoms and signs: Secondary | ICD-10-CM | POA: Diagnosis not present

## 2020-01-17 DIAGNOSIS — R634 Abnormal weight loss: Secondary | ICD-10-CM | POA: Diagnosis not present

## 2020-01-17 DIAGNOSIS — R799 Abnormal finding of blood chemistry, unspecified: Secondary | ICD-10-CM | POA: Diagnosis not present

## 2020-01-17 DIAGNOSIS — M6281 Muscle weakness (generalized): Secondary | ICD-10-CM | POA: Diagnosis not present

## 2020-01-17 DIAGNOSIS — I10 Essential (primary) hypertension: Secondary | ICD-10-CM | POA: Diagnosis not present

## 2020-01-17 DIAGNOSIS — R200 Anesthesia of skin: Secondary | ICD-10-CM | POA: Diagnosis not present

## 2020-01-17 DIAGNOSIS — E7849 Other hyperlipidemia: Secondary | ICD-10-CM | POA: Diagnosis not present

## 2020-01-17 NOTE — Progress Notes (Signed)
[facility]  PATIENT: Traci Parker DOB: 1946-02-07  REFERRING CLINICIAN: Doree Albee, MD HISTORY FROM: patient, daughter and son REASON FOR VISIT: new consult    HISTORICAL  CHIEF COMPLAINT:  Chief Complaint  Patient presents with  . Balance issues, ataxia    rm 7 New Pt, dgtr- [name], son- [name] "hard time swallowing, using hands, balance, alot of drooling; had MRI to check for stroke"    HISTORY OF PRESENT ILLNESS:   74 year old female with failure to thrive, weight loss, muscle weakness, numbness, gait difficulty sialorrhea.  Patient has been living alone up until 2021.  Around April 2021 she started to lose appetite.  She started to lose significant weight.  She had gait and balance difficulty, anemia, electrolyte abnormalities, multiple admissions to the hospital.  She was not able to take care of herself.  She transition to skilled nursing facility around June 2021.  Patient also has been having trouble controlling saliva and drooling.  Patient has history of B12 deficiency and anemia in the past, but current levels have been normal.  Patient has bilateral renal atrophy and chronic anemia.  Patient on Epogen injections.    REVIEW OF SYSTEMS: Full 14 system review of systems performed and negative with exception of: As per HPI.  ALLERGIES: Allergies  Allergen Reactions  . Latex Rash  . Levofloxacin Nausea And Vomiting  . Neomycin-Bacitracin-Polymyxin  [Bacitracin-Neomycin-Polymyxin] Rash  . Other Rash and Swelling  . Prednisone Hives, Itching, Rash and Other (See Comments)  . Fish Allergy     rash  . Neosporin [Neomycin-Polymyxin-Gramicidin] Itching and Rash    HOME MEDICATIONS: Outpatient Medications Prior to Visit  Medication Sig Dispense Refill  . acetaminophen (TYLENOL) 325 MG tablet Take 2 tablets (650 mg total) by mouth every 6 (six) hours as needed for mild pain (or Fever >/= 101). 12 tablet 0  . amLODipine (NORVASC) 10 MG  tablet Take 1 tablet (10 mg total) by mouth daily. 30 tablet 1  . dorzolamide (TRUSOPT) 2 % ophthalmic solution Place 1 drop into both eyes 2 (two) times daily.    . feeding supplement, ENSURE ENLIVE, (ENSURE ENLIVE) LIQD Take 237 mLs by mouth 2 (two) times daily between meals.    . folic acid (FOLVITE) 1 MG tablet Take 1 mg by mouth daily.     Marland Kitchen gabapentin (NEURONTIN) 100 MG capsule Take 100 mg by mouth at bedtime.    Marland Kitchen latanoprost (XALATAN) 0.005 % ophthalmic solution Place 1 drop into both eyes at bedtime.    . megestrol (MEGACE) 400 MG/10ML suspension Take 10 mLs (400 mg total) by mouth daily. 480 mL 0  . metoCLOPramide (REGLAN) 5 MG tablet Take 1 tablet (5 mg total) by mouth 3 (three) times daily before meals. 90 tablet 1  . NON FORMULARY Diet Change: Dys 2 (ground), continue thin liquids; add extra gravy to meats    . omeprazole (PRILOSEC) 40 MG capsule Take 40 mg by mouth daily.     . ondansetron (ZOFRAN ODT) 4 MG disintegrating tablet Take 1 tablet (4 mg total) by mouth every 8 (eight) hours as needed for nausea or vomiting. Dissolve under tongue 60 tablet 2  . albuterol (VENTOLIN HFA) 108 (90 Base) MCG/ACT inhaler Inhale 2 puffs into the lungs every 4 (four) hours as needed for wheezing or shortness of breath. (Patient not taking: Reported on 01/17/2020) 8 g 2  . cyanocobalamin (,VITAMIN B-12,) 1000 MCG/ML injection Inject 1 mL (1,000 mcg total) into the muscle every  30 (thirty) days. (Patient not taking: Reported on 01/17/2020) 30 mL 0   No facility-administered medications prior to visit.    PAST MEDICAL HISTORY: Past Medical History:  Diagnosis Date  . Allergy   . Anemia    pernicious anemia  . Asthma   . Blood transfusion without reported diagnosis   . CKD (chronic kidney disease)   . COPD (chronic obstructive pulmonary disease) ([place]) 12/23/2019  . Glaucoma   . History of blood in urine   . Hyperlipidemia   . Hypertension   . Osteoporosis   . Type 2 diabetes mellitus ([place])  12/23/2019    PAST SURGICAL HISTORY: Past Surgical History:  Procedure Laterality Date  . CHOLECYSTECTOMY  2008  . COLONOSCOPY  04/30/2011   Procedure: COLONOSCOPY;  Surgeon: Rogene Houston, MD;  [facility];  Service: Endoscopy;  Laterality: N/A;  . COLONOSCOPY  05/25/2012   Procedure: COLONOSCOPY;  Surgeon: Rogene Houston, MD;  [facility];  Service: Endoscopy;  Laterality: N/A;  1:25-changed to 1200 Ann to notify pt  . COLONOSCOPY Bilateral 12/2017  . ESOPHAGOGASTRODUODENOSCOPY N/A 12/07/2019   Procedure: ESOPHAGOGASTRODUODENOSCOPY (EGD);  Surgeon: Rogene Houston, MD;  [facility];  Service: Endoscopy;  Laterality: N/A;  155  . GALLBLADDER SURGERY    . right breast cystectomy  1988  . TUBAL LIGATION  1975    FAMILY HISTORY: Family History  Problem Relation Age of Onset  . Diabetes Mother   . Hyperlipidemia Mother   . Hypertension Mother   . Heart disease Father   . Hyperlipidemia Father   . Diabetes Sister   . Hyperlipidemia Sister   . Hypertension Sister   . Cancer Brother   . Heart disease Brother   . Hyperlipidemia Brother   . Hypertension Brother   . Diabetes Sister     SOCIAL HISTORY: Social History   Socioeconomic History  . Marital status: Divorced    Spouse name: Not on file  . Number of children: 4  . Years of education: 9  . Highest education level: Not on file  Occupational History  . Not on file  Tobacco Use  . Smoking status: Former Smoker    Packs/day: 1.00    Years: 40.00    Pack years: 40.00    Quit date: 04/03/2011    Years since quitting: 8.7  . Smokeless tobacco: Never Used  Vaping Use  . Vaping Use: Never assessed  Substance and Sexual Activity  . Alcohol use: No  . Drug use: No  . Sexual activity: Not Currently  Other Topics Concern  . Not on file  Social History Narrative   01/17/20 lives at [facility], [facility]   Social Determinants of Health   Financial Resource Strain:   .  Difficulty of Paying Living Expenses:   Food Insecurity:   . Worried About Charity fundraiser in the Last Year:   . Arboriculturist in the Last Year:   Transportation Needs:   . Film/video editor (Medical):   Marland Kitchen Lack of Transportation (Non-Medical):   Physical Activity:   . Days of Exercise per Week:   . Minutes of Exercise per Session:   Stress:   . Feeling of Stress :   Social Connections:   . Frequency of Communication with Friends and Family:   . Frequency of Social Gatherings with Friends and Family:   . Attends Religious Services:   . Active Member of Clubs or Organizations:   .  Attends Archivist Meetings:   Marland Kitchen Marital Status:   Intimate Partner Violence:   . Fear of Current or Ex-Partner:   . Emotionally Abused:   Marland Kitchen Physically Abused:   . Sexually Abused:      PHYSICAL EXAM  GENERAL EXAM/CONSTITUTIONAL: Vitals:  Vitals:   01/17/20 1056  BP: 112/72  Pulse: 96  Weight: 111 lb (50.3 kg)  Height: '5\' 4"'  (1.626 m)     Body mass index is 19.05 kg/m. Wt Readings from Last 3 Encounters:  01/17/20 111 lb (50.3 kg)  01/11/20 112 lb 3.2 oz (50.9 kg)  01/05/20 112 lb 3.2 oz (50.9 kg)     Patient is in no distress; well developed, nourished and groomed; neck is supple  CARDIOVASCULAR:  Examination of carotid arteries is normal; no carotid bruits  Regular rate and rhythm, no murmurs  Examination of peripheral vascular system by observation and palpation is normal  EYES:  Ophthalmoscopic exam of optic discs and posterior segments is normal; no papilledema or hemorrhages  No exam data present  MUSCULOSKELETAL:  Gait, strength, tone, movements noted in Neurologic exam below  NEUROLOGIC: MENTAL STATUS:  No flowsheet data found.  awake, alert, oriented to person, place and time  recent and remote memory intact  normal attention and concentration  language fluent, comprehension intact, naming intact  fund of knowledge  appropriate  CRANIAL NERVE:   2nd - no papilledema on fundoscopic exam  2nd, 3rd, 4th, 6th - pupils equal and reactive to light, visual fields full to confrontation, extraocular muscles intact, no nystagmus  5th - facial sensation symmetric  7th - facial strength symmetric  8th - hearing intact  9th - palate elevates symmetrically, uvula midline  11th - shoulder shrug symmetric  12th - tongue protrusion midline  SIALORRHEA  MOTOR:   MUSCLE ATROPHY  INCREASED TONE IN BUE AND BLE  BUE 3  BLE 2-3 PROX AND 4 DISTAL  SENSORY:   normal and symmetric to light touch; DECR IN FEET AND LEGS  COORDINATION:   finger-nose-finger, fine finger movements SLOW  REFLEXES:   deep tendon reflexes TRACE and symmetric  GAIT/STATION:   IN WHEELCHAIR     DIAGNOSTIC DATA (LABS, IMAGING, TESTING) - I reviewed patient records, labs, notes, testing and imaging myself where available.  Lab Results  Component Value Date   WBC 10.2 01/10/2020   HGB 8.9 (L) 01/10/2020   HCT 28.2 (L) 01/10/2020   MCV 67.6 (L) 01/10/2020   PLT 290 01/10/2020      Component Value Date/Time   NA 128 (L) 01/10/2020 1224   K 4.2 01/10/2020 1224   CL 94 (L) 01/10/2020 1224   CO2 22 01/10/2020 1224   GLUCOSE 117 (H) 01/10/2020 1224   BUN 24 (H) 01/10/2020 1224   CREATININE 1.59 (H) 01/10/2020 1224   CREATININE 1.38 (H) 03/24/2019 1049   CALCIUM 9.2 01/10/2020 1224   PROT 7.0 01/10/2020 1224   ALBUMIN 3.4 (L) 01/10/2020 1224   AST 21 01/10/2020 1224   ALT 22 01/10/2020 1224   ALKPHOS 64 01/10/2020 1224   BILITOT 0.4 01/10/2020 1224   GFRNONAA 31 (L) 01/10/2020 1224   GFRAA 36 (L) 01/10/2020 1224   Lab Results  Component Value Date   CHOL 149 03/24/2019   HDL 43 (L) 03/24/2019   LDLCALC 87 03/24/2019   TRIG 93 03/24/2019   CHOLHDL 3.5 03/24/2019   Lab Results  Component Value Date   HGBA1C 6.0 (A) 09/20/2019   Lab Results  Component Value Date   VITAMINB12 3,636 (H) 12/13/2019    Lab Results  Component Value Date   TSH 7.150 (H) 01/01/2020    01/05/20 MRI brain 1. Significantly motion degraded study. 2. No acute intracranial abnormality identified. 3. Moderate parenchymal volume loss and probable sequela of chronic small vessel ischemia.    ASSESSMENT AND PLAN  74 y.o. year old female here with loss of appetite, weight loss, anemia, chronic kidney disease, history of B12 deficiency, now with progressive weakness, numbness and spasticity.  We will proceed with further work-up to rule out other central nervous system or peripheral nervous system etiologies.  Dx:  1. Muscle weakness   2. Numbness     PLAN:  WEIGHT LOSS / WEAKNESS / NUMBNESS / SPASTICITY  - check MRI cervical and thoracic spine - check labs - check EMG/NCS (eval for CIDP or other neuropathy)  Orders Placed This Encounter  Procedures  . MR CERVICAL SPINE W WO CONTRAST  . MR THORACIC SPINE W WO CONTRAST  . Glutamic acid decarboxylase auto abs  . Neuromyelitis optica autoab, IgG  . HTLV 1+2 antibodies, (EIA), bld  . MMA  . Homocysteine  . A1c  . SPEP with IFE  . ANA w/Reflex  . SSA, SSB  . ESR  . CRP  . HIV  . RPR  . Hepatitis B surface antibody, qualitative  . Hepatitis B surface antigen  . Hepatitis C antibody  . Hepatitis B core antibody, total  . Vitamin B1  . Vitamin B6  . Copper  . ANCA  . Heavy metals  . Anti-HU, Anti-RI, Anti-YO IFA  . NCV with EMG(electromyography)   Return for for NCV/EMG.    Penni Bombard, MD 01/18/9093, 00:05 AM Certified in Neurology, Neurophysiology and Neuroimaging  [facility] [address], [place], [place] [address] [phone]

## 2020-01-18 ENCOUNTER — Non-Acute Institutional Stay (SKILLED_NURSING_FACILITY): Payer: Medicare Other | Admitting: Adult Health

## 2020-01-18 ENCOUNTER — Encounter: Payer: Self-pay | Admitting: Adult Health

## 2020-01-18 DIAGNOSIS — E538 Deficiency of other specified B group vitamins: Secondary | ICD-10-CM

## 2020-01-18 DIAGNOSIS — R627 Adult failure to thrive: Secondary | ICD-10-CM

## 2020-01-18 DIAGNOSIS — H40053 Ocular hypertension, bilateral: Secondary | ICD-10-CM | POA: Diagnosis not present

## 2020-01-18 NOTE — Progress Notes (Signed)
Location:    [facility] Room Number: 148/W Place of Service:  SNF (31)   CODE STATUS: Full Code  Allergies  Allergen Reactions  . Latex Rash  . Levofloxacin Nausea And Vomiting  . Neomycin-Bacitracin-Polymyxin  [Bacitracin-Neomycin-Polymyxin] Rash  . Other Rash and Swelling  . Prednisone Hives, Itching, Rash and Other (See Comments)  . Fish Allergy     rash  . Neosporin [Neomycin-Polymyxin-Gramicidin] Itching and Rash    Chief Complaint  Patient presents with  . Short Term Rehab          Increased intraocular pressure bilateral:   Vit B 12 deficiency: Failure to thrive in adult:  Weekly follow up for the first 30 days post hospitalization.     HPI:  She is a 73 year old short term rehab patient being seen for the management of her chronic illnesses: intraocular pressure; vit b12 deficiency; and failure to thrive. She has been seen by neurology who continues to work her up. She denies any uncontrolled pain; appetite remains variable. No reports of constipation. She continues to have weakness present.   Past Medical History:  Diagnosis Date  . Allergy   . Anemia    pernicious anemia  . Asthma   . Blood transfusion without reported diagnosis   . CKD (chronic kidney disease)   . COPD (chronic obstructive pulmonary disease) ([place]) 12/23/2019  . Glaucoma   . History of blood in urine   . Hyperlipidemia   . Hypertension   . Osteoporosis   . Type 2 diabetes mellitus ([place]) 12/23/2019    Past Surgical History:  Procedure Laterality Date  . CHOLECYSTECTOMY  2008  . COLONOSCOPY  04/30/2011   Procedure: COLONOSCOPY;  Surgeon: Rogene Houston, MD;  [facility];  Service: Endoscopy;  Laterality: N/A;  . COLONOSCOPY  05/25/2012   Procedure: COLONOSCOPY;  Surgeon: Rogene Houston, MD;  [facility];  Service: Endoscopy;  Laterality: N/A;  1:25-changed to 1200 Ann to notify pt  . COLONOSCOPY Bilateral 12/2017  .  ESOPHAGOGASTRODUODENOSCOPY N/A 12/07/2019   Procedure: ESOPHAGOGASTRODUODENOSCOPY (EGD);  Surgeon: Rogene Houston, MD;  [facility];  Service: Endoscopy;  Laterality: N/A;  155  . GALLBLADDER SURGERY    . right breast cystectomy  1988  . TUBAL LIGATION  1975    Social History   Socioeconomic History  . Marital status: Divorced    Spouse name: Not on file  . Number of children: 4  . Years of education: 9  . Highest education level: Not on file  Occupational History  . Not on file  Tobacco Use  . Smoking status: Former Smoker    Packs/day: 1.00    Years: 40.00    Pack years: 40.00    Quit date: 04/03/2011    Years since quitting: 8.8  . Smokeless tobacco: Never Used  Vaping Use  . Vaping Use: Never assessed  Substance and Sexual Activity  . Alcohol use: No  . Drug use: No  . Sexual activity: Not Currently  Other Topics Concern  . Not on file  Social History Narrative   01/17/20 lives at [facility], [name]   Social Determinants of Health   Financial Resource Strain:   . Difficulty of Paying Living Expenses:   Food Insecurity:   . Worried About Charity fundraiser in the Last Year:   . Arboriculturist in the Last Year:   Transportation Needs:   . Lack  of Transportation (Medical):   Marland Kitchen Lack of Transportation (Non-Medical):   Physical Activity:   . Days of Exercise per Week:   . Minutes of Exercise per Session:   Stress:   . Feeling of Stress :   Social Connections:   . Frequency of Communication with Friends and Family:   . Frequency of Social Gatherings with Friends and Family:   . Attends Religious Services:   . Active Member of Clubs or Organizations:   . Attends Archivist Meetings:   Marland Kitchen Marital Status:   Intimate Partner Violence:   . Fear of Current or Ex-Partner:   . Emotionally Abused:   Marland Kitchen Physically Abused:   . Sexually Abused:    Family History  Problem Relation Age of Onset  . Diabetes Mother   . Hyperlipidemia Mother     . Hypertension Mother   . Heart disease Father   . Hyperlipidemia Father   . Diabetes Sister   . Hyperlipidemia Sister   . Hypertension Sister   . Cancer Brother   . Heart disease Brother   . Hyperlipidemia Brother   . Hypertension Brother   . Diabetes Sister       VITAL SIGNS BP 120/66   Pulse 99   Temp 98.6 F (37 C) (Oral)   Ht 5\' 4"  (1.626 m)   Wt 111 lb (50.3 kg)   BMI 19.05 kg/m   Outpatient Encounter Medications as of 01/18/2020  Medication Sig  . acetaminophen (TYLENOL) 325 MG tablet Take 2 tablets (650 mg total) by mouth every 6 (six) hours as needed for mild pain (or Fever >/= 101).  Marland Kitchen albuterol (VENTOLIN HFA) 108 (90 Base) MCG/ACT inhaler Inhale 2 puffs into the lungs every 4 (four) hours as needed for wheezing or shortness of breath.  Marland Kitchen amLODipine (NORVASC) 10 MG tablet Take 1 tablet (10 mg total) by mouth daily.  . cyanocobalamin (,VITAMIN B-12,) 1000 MCG/ML injection Inject 1 mL (1,000 mcg total) into the muscle every 30 (thirty) days.  . dorzolamide (TRUSOPT) 2 % ophthalmic solution Place 1 drop into both eyes 2 (two) times daily.  . feeding supplement, ENSURE ENLIVE, (ENSURE ENLIVE) LIQD Take 237 mLs by mouth 2 (two) times daily between meals.  . folic acid (FOLVITE) 1 MG tablet Take 1 mg by mouth daily.   Marland Kitchen gabapentin (NEURONTIN) 100 MG capsule Take 100 mg by mouth at bedtime.  Marland Kitchen latanoprost (XALATAN) 0.005 % ophthalmic solution Place 1 drop into both eyes at bedtime.  . megestrol (MEGACE) 400 MG/10ML suspension Take 10 mLs (400 mg total) by mouth daily.  . metoCLOPramide (REGLAN) 5 MG tablet Take 1 tablet (5 mg total) by mouth 3 (three) times daily before meals.  . NON FORMULARY Diet Change: Dys 2 (ground), continue thin liquids; add extra gravy to meats  . omeprazole (PRILOSEC) 40 MG capsule Take 40 mg by mouth daily.   . ondansetron (ZOFRAN ODT) 4 MG disintegrating tablet Take 1 tablet (4 mg total) by mouth every 8 (eight) hours as needed for nausea or  vomiting. Dissolve under tongue   No facility-administered encounter medications on file as of 01/18/2020.     SIGNIFICANT DIAGNOSTIC EXAMS  PREVIOUS   12-23-19: chest x-ray: No active disease.   12-23-19: ct angio of chest:  1. No evidence of pulmonary embolism. 2. Paraseptal emphysema, mild and worse at the lung apices. 3. 3 mm nodule along the fissure in the RIGHT lower lobe. . 4. Moderate and with 3.6 cm caliber of  ascending thoracic aorta.. Aortic aneurysm  5. Emphysema and aortic atherosclerosis.   NO NEW EXAMS   LABS REVIEWED PREVIOUS   12-23-19: wbc 8.2; hgb 7.3; hct 25.2; mcv 58.3 plt 220; glucose 114; bun 15; creat 1.46; k+ 3.2; na++ 137 ca 9.4 liver normal albumin 4.1 d-dimer 0.86; mag 1.5; phos 3.3; urine culture: e-coli 12-25-19: wbc 8.2; hgb 7.1; hct 23.5; mcv 57.6 plt 215; glucose 111; bun 8; creat 1.08; k+ 4.2; na++ 135 ca 8.8; mag 1.6 12-26-19: wbc 7.9; hgb 9.5; hct 30.0 mcv 63.6 plt 205; glucose 110; bun 13; creat 1.21; k+ 4.1; na++ 136; ca 9.2 phos 3.4 albumin 3.6  01-01-20: wbc 10.8; hgb 9.9; hct 32.5; mcv 65.4 plt 282; glucose 99; bun 21; creat 1.41; k+ 3.9; na++ 133;ca 9.1 mag 1.9 free t4: 1.37 free t3: 2.1 tsh 7.150 01-08-20: ANA neg; CRP 15.6 01-09-20: wbc 8.2; hgb 8.2; hct 25.9; mcv 66.6 plt 238;  01-10-20: wbc 10.2; hgb 8.9; hct 28.2; mcv 67.6 plt 290; gucose 117; bun 25; creat 1.59; k+ 4.2; na++ 138; ca 9.2; live normal albumin 3.4 mag 1.9 RPR neg  NO NEW LABS.    Review of Systems  Constitutional: Negative for malaise/fatigue.  Respiratory: Negative for cough and shortness of breath.   Cardiovascular: Negative for chest pain, palpitations and leg swelling.  Gastrointestinal: Negative for abdominal pain, constipation and heartburn.  Musculoskeletal: Negative for back pain, joint pain and myalgias.  Skin: Negative.   Neurological: Positive for tingling, sensory change and weakness. Negative for dizziness.  Psychiatric/Behavioral: The patient is not  nervous/anxious.     Physical Exam Constitutional:      General: She is not in acute distress.    Appearance: She is well-developed and underweight. She is not diaphoretic.  Neck:     Thyroid: No thyromegaly.  Cardiovascular:     Rate and Rhythm: Normal rate and regular rhythm.     Heart sounds: Normal heart sounds.  Pulmonary:     Effort: Pulmonary effort is normal. No respiratory distress.     Breath sounds: Normal breath sounds.  Abdominal:     General: Bowel sounds are normal. There is no distension.     Palpations: Abdomen is soft.     Tenderness: There is no abdominal tenderness.  Musculoskeletal:     Right lower leg: No edema.     Left lower leg: No edema.     Comments: Fingers are hyperextended Ataxic movements Poor coordination Poor balance Numbness in bilateral hands and feet      Lymphadenopathy:     Cervical: No cervical adenopathy.  Skin:    General: Skin is warm and dry.  Neurological:     Mental Status: She is alert and oriented to person, place, and time.  Psychiatric:        Mood and Affect: Mood normal.      ASSESSMENT/ PLAN:  TODAY  1. Increased intraocular pressure bilateral: is stable will continue trusopt to both eyes twice daily ans xalatan to eyes nightly   2. Vit B 12 deficiency: is stable will continue monthly b12 injections and folic acid 1 mg daily  3. Failure to thrive in adult: is without change will continue megace 400 mg daily and supplements as directed.     PREVIOUS   4. Chronic obstructive pulmonary disease unspecified COPD type: is stable will continue albuterol 2 puffs every 4 hours as needed  5, peripheral neuropathy: is stable will continue gabapentin 100 mg nightly   6. Ataxia:  is without change in status: will continue therapy as directed and are awaiting neurology consult  7. CKD stage 3 due to diabetes mellitus type 2: is stable bun 24; creat 1.59  8. Type 2 diabetes mellitus with stage 3 chronic kidney disease  without long term current use of insulin: is stable will monitor  9. Other chronic gastritis without hemorrhage: is stable will continue prilosec 40 mg daily reglan 5 mg three times daily   10. Hypertension associated with stage 3 chronic kidney disease due to type 2 diabetes mellitus: is stable b/p 120/66 will continue norvasc 5 mg daily  11. Anemia due ot chronic renal failure treated with erythropoietin stage 3: is table hgb 8.9 will continue therapy per hematology          MD is aware of resident's narcotic use and is in agreement with current plan of care. We will attempt to wean resident as appropriate.  Ok Edwards NP [facility]  Contact [phone] Monday through Friday 8am- 5pm  After hours call [phone]

## 2020-01-21 LAB — HEAVY METALS, BLOOD
Arsenic: 7 ug/L (ref 2–23)
Lead, Blood: 7 ug/dL — ABNORMAL HIGH (ref 0–4)
Mercury: <1 ug/L (ref 0.0–14.9)

## 2020-01-21 LAB — SEDIMENTATION RATE: Sed Rate: 38 mm/hr (ref 0–40)

## 2020-01-21 LAB — SJOGREN'S SYNDROME ANTIBODS(SSA + SSB)
ENA SSA (RO) Ab: <0.2 AI (ref 0.0–0.9)
ENA SSB (LA) Ab: <0.2 AI (ref 0.0–0.9)

## 2020-01-21 LAB — MULTIPLE MYELOMA PANEL, SERUM
Albumin SerPl Elph-Mcnc: 3.4 g/dL (ref 2.9–4.4)
Albumin/Glob SerPl: 1.1 (ref 0.7–1.7)
Alpha 1: 0.3 g/dL (ref 0.0–0.4)
Alpha2 Glob SerPl Elph-Mcnc: 0.9 g/dL (ref 0.4–1.0)
B-Globulin SerPl Elph-Mcnc: 0.9 g/dL (ref 0.7–1.3)
Gamma Glob SerPl Elph-Mcnc: 1 g/dL (ref 0.4–1.8)
Globulin, Total: 3.1 g/dL (ref 2.2–3.9)
IgA/Immunoglobulin A, Serum: 250 mg/dL (ref 64–422)
IgG (Immunoglobin G), Serum: 1003 mg/dL (ref 586–1602)
IgM (Immunoglobulin M), Srm: <5 mg/dL — ABNORMAL LOW (ref 26–217)
Total Protein: 6.5 g/dL (ref 6.0–8.5)

## 2020-01-21 LAB — RPR: RPR Ser Ql: NONREACTIVE

## 2020-01-21 LAB — COPPER, SERUM: Copper: 107 ug/dL (ref 80–158)

## 2020-01-21 LAB — GLUTAMIC ACID DECARBOXYLASE AUTO ABS: Glutamic Acid Decarb Ab: <5 U/mL (ref 0.0–5.0)

## 2020-01-21 LAB — HOMOCYSTEINE: Homocysteine: 23.1 umol/L — ABNORMAL HIGH (ref 0.0–19.2)

## 2020-01-21 LAB — PAN-ANCA
ANCA Proteinase 3: <3.5 U/mL (ref 0.0–3.5)
Atypical pANCA: <1:20 {titer}
C-ANCA: <1:20 {titer}
Myeloperoxidase Ab: <9 U/mL (ref 0.0–9.0)
P-ANCA: <1:20 {titer}

## 2020-01-21 LAB — NEUROMYELITIS OPTICA AUTOAB, IGG: NMO IgG Autoantibodies: <1.5 U/mL (ref 0.0–3.0)

## 2020-01-21 LAB — HEPATITIS C ANTIBODY: Hep C Virus Ab: <0.1 s/co ratio (ref 0.0–0.9)

## 2020-01-21 LAB — METHYLMALONIC ACID, SERUM: Methylmalonic Acid: 129 nmol/L (ref 0–378)

## 2020-01-21 LAB — C-REACTIVE PROTEIN: CRP: 23 mg/L — ABNORMAL HIGH (ref 0–10)

## 2020-01-21 LAB — ANA W/REFLEX: ANA Titer 1: NEGATIVE

## 2020-01-21 LAB — HEPATITIS B SURFACE ANTIGEN: Hepatitis B Surface Ag: NEGATIVE

## 2020-01-21 LAB — ANTI-HU, ANTI-RI, ANTI-YO IFA
Anti-Hu Ab: NEGATIVE
Anti-Ri Ab: NEGATIVE
Anti-Yo Ab: NEGATIVE

## 2020-01-21 LAB — HEPATITIS B SURFACE ANTIBODY,QUALITATIVE: Hep B Surface Ab, Qual: NONREACTIVE

## 2020-01-21 LAB — HTLV I+II ANTIBODIES, (EIA), BLD: HTLV I/II Ab: NEGATIVE

## 2020-01-21 LAB — HEMOGLOBIN A1C
Est. average glucose Bld gHb Est-mCnc: 103 mg/dL
Hgb A1c MFr Bld: 5.2 % (ref 4.8–5.6)

## 2020-01-21 LAB — HEPATITIS B CORE ANTIBODY, TOTAL: Hep B Core Total Ab: NEGATIVE

## 2020-01-21 LAB — VITAMIN B1: Thiamine: 129.4 nmol/L (ref 66.5–200.0)

## 2020-01-21 LAB — HIV ANTIBODY (ROUTINE TESTING W REFLEX): HIV Screen 4th Generation wRfx: NONREACTIVE

## 2020-01-21 LAB — VITAMIN B6: Vitamin B6: 2 ug/L (ref 2.0–32.8)

## 2020-01-22 ENCOUNTER — Telehealth: Payer: Self-pay | Admitting: Diagnostic Neuroimaging

## 2020-01-22 NOTE — Telephone Encounter (Signed)
[employer] medicare/medicaid  Tracks.  Her plan is FFS (Fee For Service), so no Josem Kaufmann will be required.  Patient is scheduled at [facility] for Thursday 02/15/20 arrival time is 1:30 pm. I called [place] center at [phone] and spoke to [place] and gave her all the appointment information. I also gave her their number of [phone] incase she needed to r/s. The reason its far out is because the are remodeling.

## 2020-01-23 ENCOUNTER — Other Ambulatory Visit (HOSPITAL_COMMUNITY): Payer: Self-pay | Admitting: *Deleted

## 2020-01-23 ENCOUNTER — Telehealth: Payer: Self-pay | Admitting: *Deleted

## 2020-01-23 DIAGNOSIS — D509 Iron deficiency anemia, unspecified: Secondary | ICD-10-CM

## 2020-01-23 NOTE — Telephone Encounter (Signed)
Received my chart: I attended my moms new patient consultation with you on last week with my brother concerning her neurological decline and was wondering if the labs indicated any findings. I did notice the presence of heavy metal in her blood work which I have concerns about a location or substance since I may now also be exposed. I know she also has some other labs that were abnormal and if any treatment can start to eliminate any toxins or disorder, as well as therapy , since at this time she is not getting any at the [facility] the; better for her recovery. I can be reached at [phone] and my name is Rogelia Boga if you have any questions

## 2020-01-23 NOTE — Telephone Encounter (Signed)
Very slightly elevated lead level; no specific treatment advised at this time. Not high enough for treatment based on guidelines. VRP

## 2020-01-24 ENCOUNTER — Other Ambulatory Visit: Payer: Self-pay

## 2020-01-24 ENCOUNTER — Inpatient Hospital Stay (HOSPITAL_BASED_OUTPATIENT_CLINIC_OR_DEPARTMENT_OTHER): Payer: Medicare Other | Admitting: Nurse Practitioner

## 2020-01-24 ENCOUNTER — Inpatient Hospital Stay (HOSPITAL_COMMUNITY): Payer: Medicare Other | Attending: Hematology

## 2020-01-24 ENCOUNTER — Other Ambulatory Visit: Payer: Self-pay | Admitting: *Deleted

## 2020-01-24 ENCOUNTER — Inpatient Hospital Stay (HOSPITAL_COMMUNITY): Payer: Medicare Other

## 2020-01-24 DIAGNOSIS — E785 Hyperlipidemia, unspecified: Secondary | ICD-10-CM | POA: Diagnosis not present

## 2020-01-24 DIAGNOSIS — J449 Chronic obstructive pulmonary disease, unspecified: Secondary | ICD-10-CM | POA: Diagnosis not present

## 2020-01-24 DIAGNOSIS — E538 Deficiency of other specified B group vitamins: Secondary | ICD-10-CM | POA: Diagnosis not present

## 2020-01-24 DIAGNOSIS — Z79899 Other long term (current) drug therapy: Secondary | ICD-10-CM | POA: Diagnosis not present

## 2020-01-24 DIAGNOSIS — Z87891 Personal history of nicotine dependence: Secondary | ICD-10-CM | POA: Insufficient documentation

## 2020-01-24 DIAGNOSIS — D509 Iron deficiency anemia, unspecified: Secondary | ICD-10-CM

## 2020-01-24 DIAGNOSIS — N189 Chronic kidney disease, unspecified: Secondary | ICD-10-CM | POA: Diagnosis not present

## 2020-01-24 DIAGNOSIS — E860 Dehydration: Secondary | ICD-10-CM

## 2020-01-24 DIAGNOSIS — D631 Anemia in chronic kidney disease: Secondary | ICD-10-CM | POA: Diagnosis not present

## 2020-01-24 DIAGNOSIS — R200 Anesthesia of skin: Secondary | ICD-10-CM

## 2020-01-24 DIAGNOSIS — M6281 Muscle weakness (generalized): Secondary | ICD-10-CM

## 2020-01-24 LAB — COMPREHENSIVE METABOLIC PANEL
ALT: 12 U/L (ref 0–44)
AST: 12 U/L — ABNORMAL LOW (ref 15–41)
Albumin: 3.2 g/dL — ABNORMAL LOW (ref 3.5–5.0)
Alkaline Phosphatase: 73 U/L (ref 38–126)
Anion gap: 11 (ref 5–15)
BUN: 15 mg/dL (ref 8–23)
CO2: 21 mmol/L — ABNORMAL LOW (ref 22–32)
Calcium: 9 mg/dL (ref 8.9–10.3)
Chloride: 90 mmol/L — ABNORMAL LOW (ref 98–111)
Creatinine, Ser: 1.17 mg/dL — ABNORMAL HIGH (ref 0.44–1.00)
GFR calc Af Amer: 53 mL/min — ABNORMAL LOW (ref >60)
GFR calc non Af Amer: 46 mL/min — ABNORMAL LOW (ref >60)
Glucose, Bld: 172 mg/dL — ABNORMAL HIGH (ref 70–99)
Potassium: 3.8 mmol/L (ref 3.5–5.1)
Sodium: 122 mmol/L — ABNORMAL LOW (ref 135–145)
Total Bilirubin: 0.6 mg/dL (ref 0.3–1.2)
Total Protein: 6.1 g/dL — ABNORMAL LOW (ref 6.5–8.1)

## 2020-01-24 LAB — CBC WITH DIFFERENTIAL/PLATELET
Abs Immature Granulocytes: 0.02 10*3/uL (ref 0.00–0.07)
Basophils Absolute: 0 10*3/uL (ref 0.0–0.1)
Basophils Relative: 0 %
Eosinophils Absolute: 0.3 10*3/uL (ref 0.0–0.5)
Eosinophils Relative: 3 %
HCT: 28 % — ABNORMAL LOW (ref 36.0–46.0)
Hemoglobin: 8.9 g/dL — ABNORMAL LOW (ref 12.0–15.0)
Immature Granulocytes: 0 %
Lymphocytes Relative: 16 %
Lymphs Abs: 1.3 10*3/uL (ref 0.7–4.0)
MCH: 22.5 pg — ABNORMAL LOW (ref 26.0–34.0)
MCHC: 31.8 g/dL (ref 30.0–36.0)
MCV: 70.7 fL — ABNORMAL LOW (ref 80.0–100.0)
Monocytes Absolute: 0.5 10*3/uL (ref 0.1–1.0)
Monocytes Relative: 6 %
Neutro Abs: 6.1 10*3/uL (ref 1.7–7.7)
Neutrophils Relative %: 75 %
Platelets: 343 10*3/uL (ref 150–400)
RBC: 3.96 MIL/uL (ref 3.87–5.11)
WBC: 8.2 10*3/uL (ref 4.0–10.5)
nRBC: 0 % (ref 0.0–0.2)

## 2020-01-24 LAB — MAGNESIUM: Magnesium: 1.8 mg/dL (ref 1.7–2.4)

## 2020-01-24 MED ORDER — EPOETIN ALFA-EPBX 10000 UNIT/ML IJ SOLN
10000.0000 [IU] | Freq: Once | INTRAMUSCULAR | Status: AC
  Administered 2020-01-24: 10000 [IU] via SUBCUTANEOUS
  Filled 2020-01-24: qty 1

## 2020-01-24 NOTE — Assessment & Plan Note (Signed)
1.  Microcytic anemia: -Last colonoscopy was in June 2019 in [place], 3 polyps removed. -Etiology is from combination of CKD and relative iron deficiency. -Initial work-up included SPEP which was negative.  Stool was negative for occult blood.  Hemoglobin electrophoresis showed severe microcytosis related to her anemia.  She could have underlying thalassemia/sickle cell trait. -She has a history of needing a blood transfusion in the early 2000's. -She denies any bright red bleeding per rectum or melena. -If there is further worsening of anemia with hemoglobin approaching 8, we will consider erythropoiesis stimulating agents. -Labs done on 01/24/2020 showed hemoglobin 8.9 -We have taken over Epogen injections for Dr. Theador Hawthorne.  We have started her on 10,000 units every 2 weeks. -She will follow-up in 6 weeks with repeat labs.  2.  CKD: -Ultrasound the kidneys on 08/22/2019 showed bilateral renal atrophy, severe atrophy of the right kidney. -Labs done on 01/24/2020 showed creatinine 1.17 -She is getting Epogen injections every 2 weeks 10,000 units. -She follows up with nephrology next week.  3.  H03 and folic acid deficiency: -She is taking folic acid 1 mg daily -She is doing B12 injections monthly.

## 2020-01-24 NOTE — Patient Instructions (Signed)
[place] at [facility] Discharge Instructions  Follow up in 6 weeks with labs. Continue every 2 week injections.    Thank you for choosing [place] at [facility] to provide your oncology and hematology care.  To afford each patient quality time with our provider, please arrive at least 15 minutes before your scheduled appointment time.   If you have a lab appointment with the [place] please come in thru the Main Entrance and check in at the main information desk.  You need to re-schedule your appointment should you arrive 10 or more minutes late.  We strive to give you quality time with our providers, and arriving late affects you and other patients whose appointments are after yours.  Also, if you no show three or more times for appointments you may be dismissed from the clinic at the providers discretion.     Again, thank you for choosing [facility].  Our hope is that these requests will decrease the amount of time that you wait before being seen by our physicians.       _____________________________________________________________  Should you have questions after your visit to [facility], please contact our office at [phone] between the hours of 8:00 a.m. and 4:30 p.m.  Voicemails left after 4:00 p.m. will not be returned until the following business day.  For prescription refill requests, have your pharmacy contact our office and allow 72 hours.    Due to Covid, you will need to wear a mask upon entering the hospital. If you do not have a mask, a mask will be given to you at the Main Entrance upon arrival. For doctor visits, patients may have 1 support person with them. For treatment visits, patients can not have anyone with them due to social distancing guidelines and our immunocompromised population.

## 2020-01-24 NOTE — Progress Notes (Signed)
[name], [place] [address]   CLINIC:  Medical Oncology/Hematology  PCP:  Doree Albee, MD [place] [address] [phone]   REASON FOR VISIT: Follow-up for microcytic anemia.   CURRENT THERAPY: Epogen injections every 2 weeks   INTERVAL HISTORY:  Traci Parker 74 y.o. female returns for routine follow-up for microcytic anemia.  Patient reports she is feeling very weak and fatigued.  She is currently at the [facility] for rehab.  She will continue to get her Epogen injections with [place] every 2 weeks. Denies any nausea, vomiting, or diarrhea. Denies any new pains. Had not noticed any recent bleeding such as epistaxis, hematuria or hematochezia. Denies recent chest pain on exertion, shortness of breath on minimal exertion, pre-syncopal episodes, or palpitations. Denies any numbness or tingling in hands or feet. Denies any recent fevers, infections, or recent hospitalizations. Patient reports appetite at 50% and energy level at 25%.     REVIEW OF SYSTEMS:  Review of Systems  Constitutional: Positive for fatigue.  Neurological: Positive for numbness.  Psychiatric/Behavioral: Positive for sleep disturbance.  All other systems reviewed and are negative.    PAST MEDICAL/SURGICAL HISTORY:  Past Medical History:  Diagnosis Date  . Allergy   . Anemia    pernicious anemia  . Asthma   . Blood transfusion without reported diagnosis   . CKD (chronic kidney disease)   . COPD (chronic obstructive pulmonary disease) ([place]) 12/23/2019  . Glaucoma   . History of blood in urine   . Hyperlipidemia   . Hypertension   . Osteoporosis   . Type 2 diabetes mellitus ([place]) 12/23/2019   Past Surgical History:  Procedure Laterality Date  . CHOLECYSTECTOMY  2008  . COLONOSCOPY  04/30/2011   Procedure: COLONOSCOPY;  Surgeon: Rogene Houston, MD;  [facility];  Service: Endoscopy;  Laterality: N/A;  . COLONOSCOPY  05/25/2012    Procedure: COLONOSCOPY;  Surgeon: Rogene Houston, MD;  Location[facility];  Service: Endoscopy;  Laterality: N/A;  1:25-changed to 1200 Ann to notify pt  . COLONOSCOPY Bilateral 12/2017  . ESOPHAGOGASTRODUODENOSCOPY N/A 12/07/2019   Procedure: ESOPHAGOGASTRODUODENOSCOPY (EGD);  Surgeon: Rogene Houston, MD;  Location: [facility];  Service: Endoscopy;  Laterality: N/A;  155  . GALLBLADDER SURGERY    . right breast cystectomy  1988  . TUBAL LIGATION  1975     SOCIAL HISTORY:  Social History   Socioeconomic History  . Marital status: Divorced    Spouse name: Not on file  . Number of children: 4  . Years of education: 9  . Highest education level: Not on file  Occupational History  . Not on file  Tobacco Use  . Smoking status: Former Smoker    Packs/day: 1.00    Years: 40.00    Pack years: 40.00    Quit date: 04/03/2011    Years since quitting: 8.8  . Smokeless tobacco: Never Used  Vaping Use  . Vaping Use: Never assessed  Substance and Sexual Activity  . Alcohol use: No  . Drug use: No  . Sexual activity: Not Currently  Other Topics Concern  . Not on file  Social History Narrative   [birth year] lives at [facility], [place]   Social Determinants of Health   Financial Resource Strain:   . Difficulty of Paying Living Expenses:   Food Insecurity:   . Worried About Charity fundraiser in the Last  Year:   . Ran Out of Food in the Last Year:   Transportation Needs:   . Film/video editor (Medical):   Marland Kitchen Lack of Transportation (Non-Medical):   Physical Activity:   . Days of Exercise per Week:   . Minutes of Exercise per Session:   Stress:   . Feeling of Stress :   Social Connections:   . Frequency of Communication with Friends and Family:   . Frequency of Social Gatherings with Friends and Family:   . Attends Religious Services:   . Active Member of Clubs or Organizations:   . Attends Archivist Meetings:   Marland Kitchen Marital Status:   Intimate Partner  Violence:   . Fear of Current or Ex-Partner:   . Emotionally Abused:   Marland Kitchen Physically Abused:   . Sexually Abused:     FAMILY HISTORY:  Family History  Problem Relation Age of Onset  . Diabetes Mother   . Hyperlipidemia Mother   . Hypertension Mother   . Heart disease Father   . Hyperlipidemia Father   . Diabetes Sister   . Hyperlipidemia Sister   . Hypertension Sister   . Cancer Brother   . Heart disease Brother   . Hyperlipidemia Brother   . Hypertension Brother   . Diabetes Sister     CURRENT MEDICATIONS:  No facility-administered encounter medications on file as of 01/24/2020.   Outpatient Encounter Medications as of 01/24/2020  Medication Sig  . amLODipine (NORVASC) 10 MG tablet Take 1 tablet (10 mg total) by mouth daily.  . cyanocobalamin (,VITAMIN B-12,) 1000 MCG/ML injection Inject 1 mL (1,000 mcg total) into the muscle every 30 (thirty) days.  . dorzolamide (TRUSOPT) 2 % ophthalmic solution Place 1 drop into both eyes 2 (two) times daily.  . feeding supplement, ENSURE ENLIVE, (ENSURE ENLIVE) LIQD Take 237 mLs by mouth 2 (two) times daily between meals.  . folic acid (FOLVITE) 1 MG tablet Take 1 mg by mouth daily.   Marland Kitchen gabapentin (NEURONTIN) 100 MG capsule Take 100 mg by mouth at bedtime.  Marland Kitchen latanoprost (XALATAN) 0.005 % ophthalmic solution Place 1 drop into both eyes at bedtime.  . megestrol (MEGACE) 400 MG/10ML suspension Take 10 mLs (400 mg total) by mouth daily.  . metoCLOPramide (REGLAN) 5 MG tablet Take 1 tablet (5 mg total) by mouth 3 (three) times daily before meals.  . NON FORMULARY Diet Change: Dys 2 (ground), continue thin liquids; add extra gravy to meats  . omeprazole (PRILOSEC) 40 MG capsule Take 40 mg by mouth daily.   Marland Kitchen acetaminophen (TYLENOL) 325 MG tablet Take 2 tablets (650 mg total) by mouth every 6 (six) hours as needed for mild pain (or Fever >/= 101). (Patient not taking: Reported on 01/24/2020)  . albuterol (VENTOLIN HFA) 108 (90 Base) MCG/ACT  inhaler Inhale 2 puffs into the lungs every 4 (four) hours as needed for wheezing or shortness of breath. (Patient not taking: Reported on 01/24/2020)  . ondansetron (ZOFRAN ODT) 4 MG disintegrating tablet Take 1 tablet (4 mg total) by mouth every 8 (eight) hours as needed for nausea or vomiting. Dissolve under tongue (Patient not taking: Reported on 01/24/2020)    ALLERGIES:  Allergies  Allergen Reactions  . Latex Rash  . Levofloxacin Nausea And Vomiting  . Neomycin-Bacitracin-Polymyxin  [Bacitracin-Neomycin-Polymyxin] Rash  . Other Rash and Swelling  . Prednisone Hives, Itching, Rash and Other (See Comments)  . Fish Allergy     rash  . Neosporin [Neomycin-Polymyxin-Gramicidin] Itching and Rash  PHYSICAL EXAM:  ECOG Performance status: 2  Vitals:   01/24/20 1035  BP: 105/66  Pulse: 98  Resp: 16  Temp: (!) 96.9 F (36.1 C)  SpO2: 100%   There were no vitals filed for this visit.    Physical Exam Constitutional:      Appearance: She is ill-appearing.  Cardiovascular:     Rate and Rhythm: Normal rate and regular rhythm.     Heart sounds: Normal heart sounds.  Pulmonary:     Effort: Pulmonary effort is normal.     Breath sounds: Normal breath sounds.  Abdominal:     General: Bowel sounds are normal.     Palpations: Abdomen is soft.  Musculoskeletal:        General: Normal range of motion.  Skin:    General: Skin is warm.  Neurological:     Mental Status: She is alert and oriented to person, place, and time. Mental status is at baseline.  Psychiatric:        Mood and Affect: Mood normal.        Behavior: Behavior normal.        Thought Content: Thought content normal.        Judgment: Judgment normal.      LABORATORY DATA:  I have reviewed the labs as listed.  CBC    Component Value Date/Time   WBC 8.2 01/24/2020 0958   RBC 3.96 01/24/2020 0958   HGB 8.9 (L) 01/24/2020 0958   HCT 28.0 (L) 01/24/2020 0958   PLT 343 01/24/2020 0958   MCV 70.7 (L)  01/24/2020 0958   MCH 22.5 (L) 01/24/2020 0958   MCHC 31.8 01/24/2020 0958   RDW Not Measured 01/24/2020 0958   LYMPHSABS 1.3 01/24/2020 0958   MONOABS 0.5 01/24/2020 0958   EOSABS 0.3 01/24/2020 0958   BASOSABS 0.0 01/24/2020 0958   CMP Latest Ref Rng & Units 01/24/2020 01/17/2020 01/10/2020  Glucose 70 - 99 mg/dL 172(H) - 117(H)  BUN 8 - 23 mg/dL 15 - 24(H)  Creatinine 0.44 - 1.00 mg/dL 1.17(H) - 1.59(H)  Sodium 135 - 145 mmol/L 122(L) - 128(L)  Potassium 3.5 - 5.1 mmol/L 3.8 - 4.2  Chloride 98 - 111 mmol/L 90(L) - 94(L)  CO2 22 - 32 mmol/L 21(L) - 22  Calcium 8.9 - 10.3 mg/dL 9.0 - 9.2  Total Protein 6.5 - 8.1 g/dL 6.1(L) 6.5 7.0  Total Bilirubin 0.3 - 1.2 mg/dL 0.6 - 0.4  Alkaline Phos 38 - 126 U/L 73 - 64  AST 15 - 41 U/L 12(L) - 21  ALT 0 - 44 U/L 12 - 22    All questions were answered to patient's stated satisfaction. Encouraged patient to call with any new concerns or questions before his next visit to the cancer center and we can certain see him sooner, if needed.     ASSESSMENT & PLAN:  Microcytic anemia 1.  Microcytic anemia: -Last colonoscopy was in June 2019 in [place], 3 polyps removed. -Etiology is from combination of CKD and relative iron deficiency. -Initial work-up included SPEP which was negative.  Stool was negative for occult blood.  Hemoglobin electrophoresis showed severe microcytosis related to her anemia.  She could have underlying thalassemia/sickle cell trait. -She has a history of needing a blood transfusion in the early 2000's. -She denies any bright red bleeding per rectum or melena. -If there is further worsening of anemia with hemoglobin approaching 8, we will consider erythropoiesis stimulating agents. -Labs done on 01/24/2020 showed hemoglobin  8.9 -We have taken over Epogen injections for Dr. Theador Hawthorne.  We have started her on 10,000 units every 2 weeks. -She will follow-up in 6 weeks with repeat labs.  2.  CKD: -Ultrasound the kidneys on  08/22/2019 showed bilateral renal atrophy, severe atrophy of the right kidney. -Labs done on 01/24/2020 showed creatinine 1.17 -She is getting Epogen injections every 2 weeks 10,000 units. -She follows up with nephrology next week.  3.  M22 and folic acid deficiency: -She is taking folic acid 1 mg daily -She is doing B12 injections monthly.     Orders placed this encounter:  Orders Placed This Encounter  Procedures  . Lactate dehydrogenase  . CBC with Differential/Platelet  . Comprehensive metabolic panel  . Ferritin  . Iron and TIBC  . Vitamin B12  . VITAMIN D 25 Hydroxy (Vit-D Deficiency, Fractures)  . CBC with Differential/Platelet      Francene Finders, FNP-C [place] [phone]

## 2020-01-25 ENCOUNTER — Ambulatory Visit (HOSPITAL_COMMUNITY): Payer: Medicaid Other | Admitting: Nurse Practitioner

## 2020-01-25 ENCOUNTER — Other Ambulatory Visit (HOSPITAL_COMMUNITY): Payer: Medicaid Other

## 2020-01-28 ENCOUNTER — Other Ambulatory Visit: Payer: Self-pay | Admitting: Family Medicine

## 2020-01-30 MED ORDER — ALPRAZOLAM 0.5 MG PO TABS
ORAL_TABLET | ORAL | 0 refills | Status: DC
Start: 2020-01-30 — End: 2020-02-10

## 2020-01-30 NOTE — Telephone Encounter (Signed)
Meds ordered this encounter  Medications  . ALPRAZolam (XANAX) 0.5 MG tablet    Sig: for sedation before MRI scan; take 1 tab 1 hour before scan; may repeat 1 tab 15 min before scan    Dispense:  3 tablet    Refill:  0    Penni Bombard, MD 8/59/2924, 4:62 AM Certified in Neurology, Neurophysiology and [facility] [address], [place], [place] [address] [phone]

## 2020-01-30 NOTE — Addendum Note (Signed)
Addended by: Andrey Spearman R on: 01/30/2020 09:59 AM   Modules accepted: Orders

## 2020-01-31 DIAGNOSIS — E871 Hypo-osmolality and hyponatremia: Secondary | ICD-10-CM | POA: Diagnosis not present

## 2020-01-31 DIAGNOSIS — R809 Proteinuria, unspecified: Secondary | ICD-10-CM | POA: Diagnosis not present

## 2020-01-31 DIAGNOSIS — D638 Anemia in other chronic diseases classified elsewhere: Secondary | ICD-10-CM | POA: Diagnosis not present

## 2020-01-31 DIAGNOSIS — I129 Hypertensive chronic kidney disease with stage 1 through stage 4 chronic kidney disease, or unspecified chronic kidney disease: Secondary | ICD-10-CM | POA: Diagnosis not present

## 2020-01-31 DIAGNOSIS — N189 Chronic kidney disease, unspecified: Secondary | ICD-10-CM | POA: Diagnosis not present

## 2020-02-01 ENCOUNTER — Inpatient Hospital Stay (HOSPITAL_COMMUNITY)
Admission: EM | Admit: 2020-02-01 | Discharge: 2020-02-09 | DRG: 641 | Disposition: A | Discharge status: Skilled Nursing Facility | Payer: Medicare Other | Source: Skilled Nursing Facility | Attending: Family Medicine | Admitting: Family Medicine

## 2020-02-01 ENCOUNTER — Ambulatory Visit (INDEPENDENT_AMBULATORY_CARE_PROVIDER_SITE_OTHER): Payer: Medicare Other | Admitting: Gastroenterology

## 2020-02-01 ENCOUNTER — Ambulatory Visit (INDEPENDENT_AMBULATORY_CARE_PROVIDER_SITE_OTHER): Payer: Medicaid Other | Admitting: Gastroenterology

## 2020-02-01 ENCOUNTER — Other Ambulatory Visit: Payer: Self-pay

## 2020-02-01 ENCOUNTER — Encounter (HOSPITAL_COMMUNITY): Payer: Self-pay | Admitting: *Deleted

## 2020-02-01 ENCOUNTER — Other Ambulatory Visit (HOSPITAL_COMMUNITY)
Admission: RE | Admit: 2020-02-01 | Discharge: 2020-02-01 | Disposition: A | Discharge status: Home / Self Care | Payer: Medicare Other | Source: Skilled Nursing Facility | Attending: *Deleted | Admitting: *Deleted

## 2020-02-01 DIAGNOSIS — I700 Atherosclerosis of aorta: Secondary | ICD-10-CM | POA: Diagnosis not present

## 2020-02-01 DIAGNOSIS — D513 Other dietary vitamin B12 deficiency anemia: Secondary | ICD-10-CM | POA: Diagnosis not present

## 2020-02-01 DIAGNOSIS — I129 Hypertensive chronic kidney disease with stage 1 through stage 4 chronic kidney disease, or unspecified chronic kidney disease: Secondary | ICD-10-CM | POA: Insufficient documentation

## 2020-02-01 DIAGNOSIS — D631 Anemia in chronic kidney disease: Secondary | ICD-10-CM | POA: Diagnosis present

## 2020-02-01 DIAGNOSIS — M4802 Spinal stenosis, cervical region: Secondary | ICD-10-CM | POA: Diagnosis not present

## 2020-02-01 DIAGNOSIS — I82432 Acute embolism and thrombosis of left popliteal vein: Secondary | ICD-10-CM | POA: Diagnosis not present

## 2020-02-01 DIAGNOSIS — N189 Chronic kidney disease, unspecified: Secondary | ICD-10-CM | POA: Diagnosis present

## 2020-02-01 DIAGNOSIS — E441 Mild protein-calorie malnutrition: Secondary | ICD-10-CM | POA: Insufficient documentation

## 2020-02-01 DIAGNOSIS — Z6820 Body mass index (BMI) 20.0-20.9, adult: Secondary | ICD-10-CM | POA: Diagnosis not present

## 2020-02-01 DIAGNOSIS — J449 Chronic obstructive pulmonary disease, unspecified: Secondary | ICD-10-CM | POA: Diagnosis present

## 2020-02-01 DIAGNOSIS — R270 Ataxia, unspecified: Secondary | ICD-10-CM | POA: Diagnosis not present

## 2020-02-01 DIAGNOSIS — E860 Dehydration: Secondary | ICD-10-CM | POA: Diagnosis not present

## 2020-02-01 DIAGNOSIS — M810 Age-related osteoporosis without current pathological fracture: Secondary | ICD-10-CM | POA: Diagnosis present

## 2020-02-01 DIAGNOSIS — E46 Unspecified protein-calorie malnutrition: Secondary | ICD-10-CM | POA: Insufficient documentation

## 2020-02-01 DIAGNOSIS — E1122 Type 2 diabetes mellitus with diabetic chronic kidney disease: Secondary | ICD-10-CM | POA: Insufficient documentation

## 2020-02-01 DIAGNOSIS — E440 Moderate protein-calorie malnutrition: Secondary | ICD-10-CM | POA: Diagnosis not present

## 2020-02-01 DIAGNOSIS — Z79899 Other long term (current) drug therapy: Secondary | ICD-10-CM

## 2020-02-01 DIAGNOSIS — Z8249 Family history of ischemic heart disease and other diseases of the circulatory system: Secondary | ICD-10-CM

## 2020-02-01 DIAGNOSIS — I82402 Acute embolism and thrombosis of unspecified deep veins of left lower extremity: Secondary | ICD-10-CM | POA: Diagnosis not present

## 2020-02-01 DIAGNOSIS — J90 Pleural effusion, not elsewhere classified: Secondary | ICD-10-CM | POA: Diagnosis not present

## 2020-02-01 DIAGNOSIS — Z881 Allergy status to other antibiotic agents status: Secondary | ICD-10-CM

## 2020-02-01 DIAGNOSIS — L89152 Pressure ulcer of sacral region, stage 2: Secondary | ICD-10-CM | POA: Diagnosis present

## 2020-02-01 DIAGNOSIS — Z91040 Latex allergy status: Secondary | ICD-10-CM | POA: Diagnosis not present

## 2020-02-01 DIAGNOSIS — R262 Difficulty in walking, not elsewhere classified: Secondary | ICD-10-CM | POA: Diagnosis not present

## 2020-02-01 DIAGNOSIS — I82412 Acute embolism and thrombosis of left femoral vein: Secondary | ICD-10-CM | POA: Diagnosis not present

## 2020-02-01 DIAGNOSIS — D510 Vitamin B12 deficiency anemia due to intrinsic factor deficiency: Secondary | ICD-10-CM | POA: Diagnosis present

## 2020-02-01 DIAGNOSIS — R627 Adult failure to thrive: Secondary | ICD-10-CM | POA: Diagnosis present

## 2020-02-01 DIAGNOSIS — I1310 Hypertensive heart and chronic kidney disease without heart failure, with stage 1 through stage 4 chronic kidney disease, or unspecified chronic kidney disease: Secondary | ICD-10-CM | POA: Diagnosis not present

## 2020-02-01 DIAGNOSIS — R1310 Dysphagia, unspecified: Secondary | ICD-10-CM | POA: Diagnosis not present

## 2020-02-01 DIAGNOSIS — L89610 Pressure ulcer of right heel, unstageable: Secondary | ICD-10-CM | POA: Insufficient documentation

## 2020-02-01 DIAGNOSIS — Z87891 Personal history of nicotine dependence: Secondary | ICD-10-CM | POA: Diagnosis not present

## 2020-02-01 DIAGNOSIS — G9589 Other specified diseases of spinal cord: Secondary | ICD-10-CM | POA: Diagnosis not present

## 2020-02-01 DIAGNOSIS — C799 Secondary malignant neoplasm of unspecified site: Secondary | ICD-10-CM

## 2020-02-01 DIAGNOSIS — Z86010 Personal history of colonic polyps: Secondary | ICD-10-CM

## 2020-02-01 DIAGNOSIS — E861 Hypovolemia: Secondary | ICD-10-CM | POA: Diagnosis present

## 2020-02-01 DIAGNOSIS — I82890 Acute embolism and thrombosis of other specified veins: Secondary | ICD-10-CM | POA: Diagnosis not present

## 2020-02-01 DIAGNOSIS — I824Y2 Acute embolism and thrombosis of unspecified deep veins of left proximal lower extremity: Secondary | ICD-10-CM | POA: Diagnosis not present

## 2020-02-01 DIAGNOSIS — Z20822 Contact with and (suspected) exposure to covid-19: Secondary | ICD-10-CM | POA: Diagnosis not present

## 2020-02-01 DIAGNOSIS — R918 Other nonspecific abnormal finding of lung field: Secondary | ICD-10-CM | POA: Diagnosis not present

## 2020-02-01 DIAGNOSIS — Z888 Allergy status to other drugs, medicaments and biological substances status: Secondary | ICD-10-CM | POA: Diagnosis not present

## 2020-02-01 DIAGNOSIS — Z91013 Allergy to seafood: Secondary | ICD-10-CM | POA: Diagnosis not present

## 2020-02-01 DIAGNOSIS — N183 Chronic kidney disease, stage 3 unspecified: Secondary | ICD-10-CM | POA: Diagnosis present

## 2020-02-01 DIAGNOSIS — E871 Hypo-osmolality and hyponatremia: Principal | ICD-10-CM | POA: Diagnosis present

## 2020-02-01 DIAGNOSIS — R609 Edema, unspecified: Secondary | ICD-10-CM

## 2020-02-01 DIAGNOSIS — Z741 Need for assistance with personal care: Secondary | ICD-10-CM | POA: Diagnosis not present

## 2020-02-01 DIAGNOSIS — J439 Emphysema, unspecified: Secondary | ICD-10-CM | POA: Diagnosis not present

## 2020-02-01 DIAGNOSIS — M546 Pain in thoracic spine: Secondary | ICD-10-CM | POA: Diagnosis not present

## 2020-02-01 DIAGNOSIS — R509 Fever, unspecified: Secondary | ICD-10-CM

## 2020-02-01 DIAGNOSIS — H4010X0 Unspecified open-angle glaucoma, stage unspecified: Secondary | ICD-10-CM | POA: Diagnosis not present

## 2020-02-01 DIAGNOSIS — I77811 Abdominal aortic ectasia: Secondary | ICD-10-CM | POA: Diagnosis not present

## 2020-02-01 DIAGNOSIS — Z83438 Family history of other disorder of lipoprotein metabolism and other lipidemia: Secondary | ICD-10-CM

## 2020-02-01 DIAGNOSIS — N200 Calculus of kidney: Secondary | ICD-10-CM | POA: Diagnosis not present

## 2020-02-01 DIAGNOSIS — K2970 Gastritis, unspecified, without bleeding: Secondary | ICD-10-CM | POA: Diagnosis not present

## 2020-02-01 DIAGNOSIS — L8990 Pressure ulcer of unspecified site, unspecified stage: Secondary | ICD-10-CM | POA: Insufficient documentation

## 2020-02-01 DIAGNOSIS — N1832 Chronic kidney disease, stage 3b: Secondary | ICD-10-CM | POA: Diagnosis not present

## 2020-02-01 DIAGNOSIS — E876 Hypokalemia: Secondary | ICD-10-CM | POA: Diagnosis not present

## 2020-02-01 DIAGNOSIS — Z9049 Acquired absence of other specified parts of digestive tract: Secondary | ICD-10-CM | POA: Diagnosis not present

## 2020-02-01 DIAGNOSIS — R279 Unspecified lack of coordination: Secondary | ICD-10-CM | POA: Diagnosis not present

## 2020-02-01 DIAGNOSIS — I824Z2 Acute embolism and thrombosis of unspecified deep veins of left distal lower extremity: Secondary | ICD-10-CM | POA: Diagnosis present

## 2020-02-01 DIAGNOSIS — E1129 Type 2 diabetes mellitus with other diabetic kidney complication: Secondary | ICD-10-CM | POA: Insufficient documentation

## 2020-02-01 DIAGNOSIS — R633 Feeding difficulties: Secondary | ICD-10-CM | POA: Diagnosis not present

## 2020-02-01 DIAGNOSIS — E785 Hyperlipidemia, unspecified: Secondary | ICD-10-CM | POA: Diagnosis present

## 2020-02-01 DIAGNOSIS — Z833 Family history of diabetes mellitus: Secondary | ICD-10-CM | POA: Diagnosis not present

## 2020-02-01 DIAGNOSIS — I10 Essential (primary) hypertension: Secondary | ICD-10-CM | POA: Diagnosis not present

## 2020-02-01 DIAGNOSIS — I82812 Embolism and thrombosis of superficial veins of left lower extremities: Secondary | ICD-10-CM | POA: Diagnosis not present

## 2020-02-01 DIAGNOSIS — N390 Urinary tract infection, site not specified: Secondary | ICD-10-CM | POA: Diagnosis not present

## 2020-02-01 DIAGNOSIS — D509 Iron deficiency anemia, unspecified: Secondary | ICD-10-CM | POA: Diagnosis not present

## 2020-02-01 DIAGNOSIS — D1809 Hemangioma of other sites: Secondary | ICD-10-CM | POA: Diagnosis not present

## 2020-02-01 DIAGNOSIS — M6281 Muscle weakness (generalized): Secondary | ICD-10-CM | POA: Diagnosis not present

## 2020-02-01 DIAGNOSIS — R112 Nausea with vomiting, unspecified: Secondary | ICD-10-CM | POA: Diagnosis not present

## 2020-02-01 HISTORY — DX: Vitamin B12 deficiency anemia due to intrinsic factor deficiency: D51.0

## 2020-02-01 HISTORY — DX: Ocular hypertension, unspecified eye: H40.059

## 2020-02-01 HISTORY — DX: Gastritis, unspecified, without bleeding: K29.70

## 2020-02-01 LAB — BASIC METABOLIC PANEL
Anion gap: 10 (ref 5–15)
BUN: 23 mg/dL (ref 8–23)
CO2: 22 mmol/L (ref 22–32)
Calcium: 8.6 mg/dL — ABNORMAL LOW (ref 8.9–10.3)
Chloride: 88 mmol/L — ABNORMAL LOW (ref 98–111)
Creatinine, Ser: 1.29 mg/dL — ABNORMAL HIGH (ref 0.44–1.00)
GFR calc Af Amer: 47 mL/min — ABNORMAL LOW (ref >60)
GFR calc non Af Amer: 40 mL/min — ABNORMAL LOW (ref >60)
Glucose, Bld: 109 mg/dL — ABNORMAL HIGH (ref 70–99)
Potassium: 4.3 mmol/L (ref 3.5–5.1)
Sodium: 120 mmol/L — ABNORMAL LOW (ref 135–145)

## 2020-02-01 LAB — COMPREHENSIVE METABOLIC PANEL
ALT: 11 U/L (ref 0–44)
AST: 12 U/L — ABNORMAL LOW (ref 15–41)
Albumin: 3.4 g/dL — ABNORMAL LOW (ref 3.5–5.0)
Alkaline Phosphatase: 72 U/L (ref 38–126)
Anion gap: 10 (ref 5–15)
BUN: 27 mg/dL — ABNORMAL HIGH (ref 8–23)
CO2: 21 mmol/L — ABNORMAL LOW (ref 22–32)
Calcium: 8.9 mg/dL (ref 8.9–10.3)
Chloride: 89 mmol/L — ABNORMAL LOW (ref 98–111)
Creatinine, Ser: 1.28 mg/dL — ABNORMAL HIGH (ref 0.44–1.00)
GFR calc Af Amer: 47 mL/min — ABNORMAL LOW (ref >60)
GFR calc non Af Amer: 41 mL/min — ABNORMAL LOW (ref >60)
Glucose, Bld: 140 mg/dL — ABNORMAL HIGH (ref 70–99)
Potassium: 4.4 mmol/L (ref 3.5–5.1)
Sodium: 120 mmol/L — ABNORMAL LOW (ref 135–145)
Total Bilirubin: 0.5 mg/dL (ref 0.3–1.2)
Total Protein: 6.8 g/dL (ref 6.5–8.1)

## 2020-02-01 LAB — CBC WITH DIFFERENTIAL/PLATELET
Abs Immature Granulocytes: 0.05 10*3/uL (ref 0.00–0.07)
Basophils Absolute: 0 10*3/uL (ref 0.0–0.1)
Basophils Relative: 0 %
Eosinophils Absolute: 0.1 10*3/uL (ref 0.0–0.5)
Eosinophils Relative: 1 %
HCT: 28.1 % — ABNORMAL LOW (ref 36.0–46.0)
Hemoglobin: 9.1 g/dL — ABNORMAL LOW (ref 12.0–15.0)
Immature Granulocytes: 1 %
Lymphocytes Relative: 12 %
Lymphs Abs: 1.2 10*3/uL (ref 0.7–4.0)
MCH: 24 pg — ABNORMAL LOW (ref 26.0–34.0)
MCHC: 32.4 g/dL (ref 30.0–36.0)
MCV: 74.1 fL — ABNORMAL LOW (ref 80.0–100.0)
Monocytes Absolute: 0.9 10*3/uL (ref 0.1–1.0)
Monocytes Relative: 8 %
Neutro Abs: 8.3 10*3/uL — ABNORMAL HIGH (ref 1.7–7.7)
Neutrophils Relative %: 78 %
Platelets: 271 10*3/uL (ref 150–400)
RBC: 3.79 MIL/uL — ABNORMAL LOW (ref 3.87–5.11)
WBC: 10.6 10*3/uL — ABNORMAL HIGH (ref 4.0–10.5)
nRBC: 0 % (ref 0.0–0.2)

## 2020-02-01 LAB — SARS CORONAVIRUS 2 BY RT PCR (HOSPITAL ORDER, PERFORMED IN ~~LOC~~ HOSPITAL LAB): SARS Coronavirus 2: NEGATIVE

## 2020-02-01 LAB — MAGNESIUM: Magnesium: 1.9 mg/dL (ref 1.7–2.4)

## 2020-02-01 LAB — CORTISOL: Cortisol, Plasma: 15 ug/dL

## 2020-02-01 MED ORDER — FOLIC ACID 1 MG PO TABS
1.0000 mg | ORAL_TABLET | Freq: Every day | ORAL | Status: DC
  Administered 2020-02-01 – 2020-02-09 (×9): 1 mg via ORAL
  Filled 2020-02-01 (×10): qty 1

## 2020-02-01 MED ORDER — GABAPENTIN 100 MG PO CAPS
100.0000 mg | ORAL_CAPSULE | Freq: Every day | ORAL | Status: DC
  Administered 2020-02-01 – 2020-02-08 (×8): 100 mg via ORAL
  Filled 2020-02-01 (×8): qty 1

## 2020-02-01 MED ORDER — SODIUM CHLORIDE 1 G PO TABS
2.0000 g | ORAL_TABLET | Freq: Once | ORAL | Status: AC
  Administered 2020-02-01: 2 g via ORAL
  Filled 2020-02-01 (×2): qty 2

## 2020-02-01 MED ORDER — PANTOPRAZOLE SODIUM 40 MG PO TBEC
40.0000 mg | DELAYED_RELEASE_TABLET | Freq: Every day | ORAL | Status: DC
  Administered 2020-02-01 – 2020-02-09 (×9): 40 mg via ORAL
  Filled 2020-02-01 (×9): qty 1

## 2020-02-01 MED ORDER — DORZOLAMIDE HCL 2 % OP SOLN
1.0000 [drp] | Freq: Two times a day (BID) | OPHTHALMIC | Status: DC
  Administered 2020-02-01 – 2020-02-09 (×16): 1 [drp] via OPHTHALMIC
  Filled 2020-02-01 (×2): qty 10

## 2020-02-01 MED ORDER — SODIUM CHLORIDE 0.9 % IV SOLN: Freq: Once | INTRAVENOUS | Status: AC

## 2020-02-01 MED ORDER — SODIUM CHLORIDE 0.9 % IV SOLN: INTRAVENOUS | Status: DC

## 2020-02-01 MED ORDER — ENSURE ENLIVE PO LIQD: 237.0000 mL | Freq: Two times a day (BID) | ORAL | Status: DC

## 2020-02-01 MED ORDER — AMLODIPINE BESYLATE 5 MG PO TABS
10.0000 mg | ORAL_TABLET | Freq: Every day | ORAL | Status: DC
  Administered 2020-02-02 – 2020-02-09 (×8): 10 mg via ORAL
  Filled 2020-02-01 (×8): qty 2

## 2020-02-01 MED ORDER — ALBUTEROL SULFATE (2.5 MG/3ML) 0.083% IN NEBU: 3.0000 mL | INHALATION_SOLUTION | RESPIRATORY_TRACT | Status: DC | PRN

## 2020-02-01 MED ORDER — CYANOCOBALAMIN 1000 MCG/ML IJ SOLN
1000.0000 ug | INTRAMUSCULAR | Status: DC
  Administered 2020-02-01: 1000 ug via INTRAMUSCULAR
  Filled 2020-02-01: qty 1

## 2020-02-01 MED ORDER — HEPARIN SODIUM (PORCINE) 5000 UNIT/ML IJ SOLN
5000.0000 [IU] | Freq: Three times a day (TID) | INTRAMUSCULAR | Status: DC
  Administered 2020-02-01 – 2020-02-02 (×3): 5000 [IU] via SUBCUTANEOUS
  Filled 2020-02-01 (×2): qty 1

## 2020-02-01 MED ORDER — ENSURE ENLIVE PO LIQD
237.0000 mL | Freq: Two times a day (BID) | ORAL | Status: DC
  Administered 2020-02-02 – 2020-02-09 (×12): 237 mL via ORAL
  Filled 2020-02-01 (×2): qty 237

## 2020-02-01 MED ORDER — ALPRAZOLAM 0.25 MG PO TABS: 0.2500 mg | ORAL_TABLET | Freq: Two times a day (BID) | ORAL | Status: DC | PRN

## 2020-02-01 MED ORDER — LATANOPROST 0.005 % OP SOLN
1.0000 [drp] | Freq: Every day | OPHTHALMIC | Status: DC
  Administered 2020-02-01 – 2020-02-08 (×8): 1 [drp] via OPHTHALMIC
  Filled 2020-02-01: qty 2.5

## 2020-02-01 MED ORDER — METOCLOPRAMIDE HCL 10 MG PO TABS
5.0000 mg | ORAL_TABLET | Freq: Three times a day (TID) | ORAL | Status: DC
  Administered 2020-02-02 – 2020-02-09 (×20): 5 mg via ORAL
  Filled 2020-02-01 (×22): qty 1

## 2020-02-01 NOTE — ED Triage Notes (Signed)
Pt brought over from [facility] due to hyponatremia. Sodium level 120 from blood draw today. Pt denies pain and any other complaints.

## 2020-02-01 NOTE — ED Provider Notes (Signed)
[facility] Provider Note   CSN: [id number] Arrival date & time: 02/01/20  1640     History Chief Complaint  Patient presents with  . Abnormal Lab    Traci Parker is a 74 y.o. female history of CKD COPD gastritis hypertension hyperlipidemia diabetes.  Patient sent in from [facility] for hyponatremia, sodium 120 on blood draw this morning.  Patient reports that she has been feeling in her normal state of health no recent illness.  Denies any recent nausea vomiting decreased p.o. intake diarrhea dysuria/hematuria urinary frequency, seizure-like activity, confusion, chest pain/shortness of breath or any additional concerns  HPI     Past Medical History:  Diagnosis Date  . Allergy   . Anemia    pernicious anemia  . Asthma   . Blood transfusion without reported diagnosis   . CKD (chronic kidney disease)   . COPD (chronic obstructive pulmonary disease) ([place]) 12/23/2019  . Gastritis   . Glaucoma   . History of blood in urine   . Hyperlipidemia   . Hypertension   . Ocular hypertension   . Osteoporosis   . Type 2 diabetes mellitus ([place]) 12/23/2019  . Vitamin B12 deficiency anemia due to intrinsic factor deficiency     Patient Active Problem List   Diagnosis Date Noted  . Ataxia 01/09/2020  . Thyroiditis 01/08/2020  . Peripheral neuropathy 01/08/2020  . CKD stage 3 due to type 2 diabetes mellitus ([place]) 01/05/2020  . Hypertension associated with stage 3 chronic kidney disease due to type 2 diabetes mellitus ([place]) 01/05/2020  . E-coli UTI 01/05/2020  . Anemia due to chronic renal failure treated with erythropoietin, stage 3 (moderate) 01/05/2020  . Increased intraocular pressure, bilateral 01/05/2020  . Vitamin B 12 deficiency 01/05/2020  . History of colon polyps 01/01/2020  . CKD (chronic kidney disease) 01/01/2020  . Tachycardia 12/29/2019  . Lung nodule < 6cm on CT 12/24/2019  . Failure to thrive in adult 12/23/2019  . COPD (chronic  obstructive pulmonary disease) ([place]) 12/23/2019  . Type 2 diabetes mellitus ([place]) 12/23/2019  . Hypokalemia 12/23/2019  . Hypomagnesemia 12/23/2019  . Fall 12/23/2019  . Intractable nausea and vomiting 12/19/2019  . Refractory nausea and vomiting 12/18/2019  . Volume depletion 12/18/2019  . Weight loss 12/18/2019  . Chronic gastritis 12/18/2019  . Dehydration 12/13/2019  . Nausea and vomiting 10/30/2019  . Fatigue 10/30/2019  . Osteoporosis 09/20/2019  . Hypertension 09/20/2019  . Microcytic anemia 05/08/2019  . Screening for breast cancer 03/23/2019  . Hyperlipidemia 03/23/2019  . Pre-diabetes 03/23/2019  . Open-angle glaucoma 03/23/2019    Past Surgical History:  Procedure Laterality Date  . CHOLECYSTECTOMY  2008  . COLONOSCOPY  04/30/2011   Procedure: COLONOSCOPY;  Surgeon: Rogene Houston, MD;  [facility];  Service: Endoscopy;  Laterality: N/A;  . COLONOSCOPY  05/25/2012   Procedure: COLONOSCOPY;  Surgeon: Rogene Houston, MD;  [facility];  Service: Endoscopy;  Laterality: N/A;  1:25-changed to 1200 Ann to notify pt  . COLONOSCOPY Bilateral 12/2017  . ESOPHAGOGASTRODUODENOSCOPY N/A 12/07/2019   Procedure: ESOPHAGOGASTRODUODENOSCOPY (EGD);  Surgeon: Rogene Houston, MD;  [facility];  Service: Endoscopy;  Laterality: N/A;  155  . GALLBLADDER SURGERY    . right breast cystectomy  1988  . TUBAL LIGATION  1975     OB History   No obstetric history on file.     Family History  Problem Relation Age of Onset  .  Diabetes Mother   . Hyperlipidemia Mother   . Hypertension Mother   . Heart disease Father   . Hyperlipidemia Father   . Diabetes Sister   . Hyperlipidemia Sister   . Hypertension Sister   . Cancer Brother   . Heart disease Brother   . Hyperlipidemia Brother   . Hypertension Brother   . Diabetes Sister     Social History   Tobacco Use  . Smoking status: Former Smoker    Packs/day: 1.00    Years: 40.00    Pack  years: 40.00    Quit date: 04/03/2011    Years since quitting: 8.8  . Smokeless tobacco: Never Used  Vaping Use  . Vaping Use: Never used  Substance Use Topics  . Alcohol use: No  . Drug use: No    Home Medications Prior to Admission medications   Medication Sig Start Date End Date Taking? Authorizing Provider  acetaminophen (TYLENOL) 325 MG tablet Take 2 tablets (650 mg total) by mouth every 6 (six) hours as needed for mild pain (or Fever >/= 101). Patient not taking: Reported on 01/24/2020 12/27/19   Roxan Hockey, MD  albuterol (VENTOLIN HFA) 108 (90 Base) MCG/ACT inhaler Inhale 2 puffs into the lungs every 4 (four) hours as needed for wheezing or shortness of breath. Patient not taking: Reported on 01/24/2020 11/21/19   Glennie Isle, NP-C  ALPRAZolam Duanne Moron) 0.5 MG tablet for sedation before MRI scan; take 1 tab 1 hour before scan; may repeat 1 tab 15 min before scan 01/30/20   Penumalli, Vikram R, MD  amLODipine (NORVASC) 10 MG tablet Take 1 tablet (10 mg total) by mouth daily. 12/27/19   Roxan Hockey, MD  cyanocobalamin (,VITAMIN B-12,) 1000 MCG/ML injection Inject 1 mL (1,000 mcg total) into the muscle every 30 (thirty) days. 05/30/19   Corum, Rex Kras, MD  dorzolamide (TRUSOPT) 2 % ophthalmic solution Place 1 drop into both eyes 2 (two) times daily.    [provider]  feeding supplement, ENSURE ENLIVE, (ENSURE ENLIVE) LIQD Take 237 mLs by mouth 2 (two) times daily between meals. 12/27/19   [provider]  folic acid (FOLVITE) 1 MG tablet Take 1 mg by mouth daily.  05/31/19   [provider]  gabapentin (NEURONTIN) 100 MG capsule Take 100 mg by mouth at bedtime. 01/03/20   [provider]  latanoprost (XALATAN) 0.005 % ophthalmic solution Place 1 drop into both eyes at bedtime.    [provider]  megestrol (MEGACE) 400 MG/10ML suspension Take 10 mLs (400 mg total) by mouth daily. 12/28/19   Roxan Hockey, MD  metoCLOPramide (REGLAN)  5 MG tablet Take 1 tablet (5 mg total) by mouth 3 (three) times daily before meals. 12/27/19 12/26/20  Roxan Hockey, MD  NON FORMULARY Diet Change: Dys 2 (ground), continue thin liquids; add extra gravy to meats 01/08/20   [provider]  omeprazole (PRILOSEC) 40 MG capsule Take 40 mg by mouth daily.  10/30/19   [provider]  ondansetron (ZOFRAN ODT) 4 MG disintegrating tablet Take 1 tablet (4 mg total) by mouth every 8 (eight) hours as needed for nausea or vomiting. Dissolve under tongue Patient not taking: Reported on 01/24/2020 11/21/19   Francene Finders L, NP-C    Allergies    Latex, Levofloxacin, Neomycin-bacitracin-polymyxin  [bacitracin-neomycin-polymyxin], Other, Prednisone, Fish allergy, and Neosporin [neomycin-polymyxin-gramicidin]  Review of Systems   Review of Systems Ten systems are reviewed and are negative for acute change except as noted in  the HPI  Physical Exam Updated Vital Signs BP 108/65   Pulse 104   Temp 97.9 F (36.6 C) (Oral)   Resp 15   Ht 5\' 4"  (1.626 m)   Wt 50.3 kg   SpO2 99%   BMI 19.05 kg/m   Physical Exam Constitutional:      General: She is not in acute distress.    Appearance: Normal appearance. She is well-developed. She is not toxic-appearing or diaphoretic.  HENT:     Head: Normocephalic and atraumatic.  Eyes:     General: Vision grossly intact. Gaze aligned appropriately.     Pupils: Pupils are equal, round, and reactive to light.  Neck:     Trachea: Trachea and phonation normal.  Pulmonary:     Effort: Pulmonary effort is normal. No respiratory distress.  Abdominal:     General: There is no distension.     Palpations: Abdomen is soft.     Tenderness: There is no abdominal tenderness. There is no guarding or rebound.  Musculoskeletal:        General: Normal range of motion.     Cervical back: Normal range of motion.  Skin:    General: Skin is warm and dry.  Neurological:     Mental Status: She is alert.      GCS: GCS eye subscore is 4. GCS verbal subscore is 5. GCS motor subscore is 6.     Comments: Speech is clear and goal oriented, follows commands Major Cranial nerves without deficit, no facial droop Moves extremities without ataxia, coordination intact  Psychiatric:        Behavior: Behavior normal.     ED Results / Procedures / Treatments   Labs (all labs ordered are listed, but only abnormal results are displayed) Labs Reviewed  CBC WITH DIFFERENTIAL/PLATELET - Abnormal; Notable for the following components:      Result Value   WBC 10.6 (*)    RBC 3.79 (*)    Hemoglobin 9.1 (*)    HCT 28.1 (*)    MCV 74.1 (*)    MCH 24.0 (*)    Neutro Abs 8.3 (*)    All other components within normal limits  COMPREHENSIVE METABOLIC PANEL - Abnormal; Notable for the following components:   Sodium 120 (*)    Chloride 89 (*)    CO2 21 (*)    Glucose, Bld 140 (*)    BUN 27 (*)    Creatinine, Ser 1.28 (*)    Albumin 3.4 (*)    AST 12 (*)    GFR calc non Af Amer 41 (*)    GFR calc Af Amer 47 (*)    All other components within normal limits  SARS CORONAVIRUS 2 BY RT PCR (HOSPITAL ORDER, [facility])  MAGNESIUM  URINALYSIS, ROUTINE W REFLEX MICROSCOPIC    EKG None  Radiology No results found.  Procedures Procedures (including critical care time)  Medications Ordered in ED Medications  0.9 %  sodium chloride infusion ( Intravenous New Bag/Given 02/01/20 1815)    ED Course  I have reviewed the triage vital signs and the nursing notes.  Pertinent labs & imaging results that were available during my care of the patient were reviewed by me and considered in my medical decision making (see chart for details).    MDM Rules/Calculators/A&P                         Additional  history obtained from: 1. Nursing notes from this visit. 2. Electronic medical record.  Reviewed recent labs.  Appears patient's sodium level has been downtrending for the last month, 133  on June 21, 128 on June 30, 122 on July 14, 120 today.  Kidney function appears baseline, chloride slightly low, glucose unremarkable.  No gap.  Cortisol level this morning within normal limits. ------------------------------------------  I ordered, reviewed and interpreted labs which include: CBC shows mild leukocytosis of 10.6, anemia of 9.1 near baseline. Magnesium level within normal limits. CMP shows hyponatremia again at 120, no significant changes from this morning. Urinalysis and Covid test pending.  Continuing worsening of hyponatremia over the past month, unclear cause at this point.  Will admit patient to hospitalist service for further management.  She has no neurologic complaints overall well-appearing no acute distress vital signs stable on room air.  She is agreeable for admission has no complaints or concerns at this time reports she is feeling well. Discussed case with Dr. Wilson Singer who agrees. - Discussed case with Dr. Waldron Labs at 6:35 PM, patient accepted to hospitalist service.  Patient was reassessed, resting comfortably no acute distress agreeable for admission she has no complaints at this time.   Note: Portions of this report may have been transcribed using voice recognition software. Every effort was made to ensure accuracy; however, inadvertent computerized transcription errors may still be present. Final Clinical Impression(s) / ED Diagnoses Final diagnoses:  Hyponatremia    Rx / DC Orders ED Discharge Orders    None       Gari Crown 02/01/20 1837    Virgel Manifold, MD 02/03/20 2242

## 2020-02-01 NOTE — H&P (Signed)
[facility] H&P   Patient Demographics:    Traci Parker, is a 74 y.o. female  MRN: [id number]   DOB - June 05, 1946  Admit Date - 02/01/2020  Outpatient Primary MD for the patient is Traci Albee, MD  Referring MD/NP/PA: Rella Larve  Patient coming from: SNF  Chief Complaint  Patient presents with  . Abnormal Lab      HPI:    Traci Parker  is a 74 y.o. female, with medical history significant ofseasonal allergies, pernicious anemia, asthma/COPD, history of blood transfusion, glaucoma, history of hematuria, hypertension, osteoporosis, type 2 diabetes with recent hospitalization due to pernicious anemia and failure to thrive, patient was sent from [facility] for hyponatremia, sodium sodium level was 120 on blood draw this morning at the facility, patient herself denies any complaint, reports she has been feeling at baseline, patient reports at baseline she has poor appetite as she does not like her dysphagia to diet given she has no teeth or dentures, denies any nausea, vomiting, diarrhea, no fever, no chills, no chest pain or shortness of breath, and by reviewing SNF records does not appear she was started on any new medications which would contribute to her hyponatremia. -In ED sodium level was at 120, her hemoglobin was 9.1 which is around baseline, she is with known pernicious anemia, [facility] hospitalist were consulted to admit.    Review of systems:    In addition to the HPI above,  No Fever-chills, No Headache, No changes with Vision or hearing, No problems swallowing food or Liquids, No Chest pain, Cough or Shortness of Breath, No Abdominal pain, No Nausea or Vommitting, Bowel movements are regular, No Blood in stool or Urine, No dysuria, No new skin rashes or bruises, No new joints pains-aches,  No new weakness, tingling, numbness in any extremity, No recent weight gain  or loss, No polyuria, polydypsia or polyphagia, No significant Mental Stressors.  A full 10 point Review of Systems was done, except as stated above, all other Review of Systems were negative.   With Past History of the following :    Past Medical History:  Diagnosis Date  . Allergy   . Anemia    pernicious anemia  . Asthma   . Blood transfusion without reported diagnosis   . CKD (chronic kidney disease)   . COPD (chronic obstructive pulmonary disease) ([place]) 12/23/2019  . Gastritis   . Glaucoma   . History of blood in urine   . Hyperlipidemia   . Hypertension   . Ocular hypertension   . Osteoporosis   . Type 2 diabetes mellitus ([place]) 12/23/2019  . Vitamin B12 deficiency anemia due to intrinsic factor deficiency       Past Surgical History:  Procedure Laterality Date  . CHOLECYSTECTOMY  2008  . COLONOSCOPY  04/30/2011   Procedure: COLONOSCOPY;  Surgeon: Rogene Houston, MD;  [facility];  Service: Endoscopy;  Laterality: N/A;  . COLONOSCOPY  05/25/2012   Procedure: COLONOSCOPY;  Surgeon: Rogene Houston, MD;  [facility];  Service: Endoscopy;  Laterality: N/A;  1:25-changed to 1200 Ann to notify pt  . COLONOSCOPY Bilateral 12/2017  . ESOPHAGOGASTRODUODENOSCOPY N/A 12/07/2019   Procedure: ESOPHAGOGASTRODUODENOSCOPY (EGD);  Surgeon: Rogene Houston, MD;  [facility];  Service: Endoscopy;  Laterality: N/A;  155  . GALLBLADDER SURGERY    . right breast cystectomy  1988  . TUBAL LIGATION  1975      Social History:     Social History   Tobacco Use  . Smoking status: Former Smoker    Packs/day: 1.00    Years: 40.00    Pack years: 40.00    Quit date: 04/03/2011    Years since quitting: 8.8  . Smokeless tobacco: Never Used  Substance Use Topics  . Alcohol use: No      Family History :     Family History  Problem Relation Age of Onset  . Diabetes Mother   . Hyperlipidemia Mother   . Hypertension Mother   . Heart disease  Father   . Hyperlipidemia Father   . Diabetes Sister   . Hyperlipidemia Sister   . Hypertension Sister   . Cancer Brother   . Heart disease Brother   . Hyperlipidemia Brother   . Hypertension Brother   . Diabetes Sister      Home Medications:   Prior to Admission medications   Medication Sig Start Date End Date Taking? Authorizing Provider  acetaminophen (TYLENOL) 325 MG tablet Take 2 tablets (650 mg total) by mouth every 6 (six) hours as needed for mild pain (or Fever >/= 101). Patient not taking: Reported on 01/24/2020 12/27/19   Roxan Hockey, MD  albuterol (VENTOLIN HFA) 108 (90 Base) MCG/ACT inhaler Inhale 2 puffs into the lungs every 4 (four) hours as needed for wheezing or shortness of breath. Patient not taking: Reported on 01/24/2020 11/21/19   Glennie Isle, NP-C  ALPRAZolam Duanne Moron) 0.5 MG tablet for sedation before MRI scan; take 1 tab 1 hour before scan; may repeat 1 tab 15 min before scan 01/30/20   Penumalli, Vikram R, MD  amLODipine (NORVASC) 10 MG tablet Take 1 tablet (10 mg total) by mouth daily. 12/27/19   Roxan Hockey, MD  cyanocobalamin (,VITAMIN B-12,) 1000 MCG/ML injection Inject 1 mL (1,000 mcg total) into the muscle every 30 (thirty) days. 05/30/19   Corum, Rex Kras, MD  dorzolamide (TRUSOPT) 2 % ophthalmic solution Place 1 drop into both eyes 2 (two) times daily.    [provider]  feeding supplement, ENSURE ENLIVE, (ENSURE ENLIVE) LIQD Take 237 mLs by mouth 2 (two) times daily between meals. 12/27/19   [provider]  folic acid (FOLVITE) 1 MG tablet Take 1 mg by mouth daily.  05/31/19   [provider]  gabapentin (NEURONTIN) 100 MG capsule Take 100 mg by mouth at bedtime. 01/03/20   [provider]  latanoprost (XALATAN) 0.005 % ophthalmic solution Place 1 drop into both eyes at bedtime.    [provider]  megestrol (MEGACE) 400 MG/10ML suspension Take 10 mLs (400 mg total) by mouth daily. 12/28/19   Roxan Hockey, MD  metoCLOPramide (REGLAN) 5 MG tablet Take 1 tablet (5 mg total) by mouth 3 (three) times daily before meals. 12/27/19 12/26/20  Roxan Hockey, MD  NON FORMULARY Diet Change: Dys 2 (ground), continue thin liquids; add  extra gravy to meats 01/08/20   [provider]  omeprazole (PRILOSEC) 40 MG capsule Take 40 mg by mouth daily.  10/30/19   [provider]  ondansetron (ZOFRAN ODT) 4 MG disintegrating tablet Take 1 tablet (4 mg total) by mouth every 8 (eight) hours as needed for nausea or vomiting. Dissolve under tongue Patient not taking: Reported on 01/24/2020 11/21/19   Glennie Isle, NP-C     Allergies:     Allergies  Allergen Reactions  . Latex Rash  . Levofloxacin Nausea And Vomiting  . Neomycin-Bacitracin-Polymyxin  [Bacitracin-Neomycin-Polymyxin] Rash  . Other Rash and Swelling  . Prednisone Hives, Itching, Rash and Other (See Comments)  . Fish Allergy     rash  . Neosporin [Neomycin-Polymyxin-Gramicidin] Itching and Rash     Physical Exam:   Vitals  Blood pressure 108/65, pulse 104, temperature 97.9 F (36.6 C), temperature source Oral, resp. rate 15, height 5\' 4"  (1.626 m), weight 50.3 kg, SpO2 99 %.   1. General overly female, laying in bed, no apparent distress  2. Normal affect and insight, Not Suicidal or Homicidal, Awake Alert,, conversant, answering questions appropriately  3. No F.N deficits, ALL C.Nerves Intact, Strength 5/5 all 4 extremities, Sensation intact all 4 extremities, Plantars down going.  4. Ears and Eyes appear Normal, Conjunctivae clear, PERRLA. Moist Oral Mucosa.  5. Supple Neck, No JVD, No cervical lymphadenopathy appriciated, No Carotid Bruits.  6. Symmetrical Chest wall movement, Good air movement bilaterally, CTAB.  7. RRR, No Gallops, Rubs or Murmurs, No Parasternal Heave.  +2 Left lower extremity edema +2, Trace  right lower extremity edema .  8. Positive Bowel Sounds, Abdomen Soft, No tenderness, No  organomegaly appriciated,No rebound -guarding or rigidity.  9.  No Cyanosis, Normal Skin Turgor, No Skin Rash or Bruise.  10. Good muscle tone,  joints appear normal , no effusions, Normal ROM.  11. No Palpable Lymph Nodes in Neck or Axillae     Data Review:    CBC Recent Labs  Lab 02/01/20 1724  WBC 10.6*  HGB 9.1*  HCT 28.1*  PLT 271  MCV 74.1*  MCH 24.0*  MCHC 32.4  RDW Not Measured  LYMPHSABS 1.2  MONOABS 0.9  EOSABS 0.1  BASOSABS 0.0   ------------------------------------------------------------------------------------------------------------------  Chemistries  Recent Labs  Lab 02/01/20 0700 02/01/20 1724  NA 120* 120*  K 4.3 4.4  CL 88* 89*  CO2 22 21*  GLUCOSE 109* 140*  BUN 23 27*  CREATININE 1.29* 1.28*  CALCIUM 8.6* 8.9  MG  --  1.9  AST  --  12*  ALT  --  11  ALKPHOS  --  72  BILITOT  --  0.5   ------------------------------------------------------------------------------------------------------------------ estimated creatinine clearance is 30.2 mL/min (A) (by C-G formula based on SCr of 1.28 mg/dL (H)). ------------------------------------------------------------------------------------------------------------------ No results for input(s): TSH, T4TOTAL, T3FREE, THYROIDAB in the last 72 hours.  Invalid input(s): FREET3  Coagulation profile No results for input(s): INR, PROTIME in the last 168 hours. ------------------------------------------------------------------------------------------------------------------- No results for input(s): DDIMER in the last 72 hours. -------------------------------------------------------------------------------------------------------------------  Cardiac Enzymes No results for input(s): CKMB, TROPONINI, MYOGLOBIN in the last 168 hours.  Invalid input(s): CK ------------------------------------------------------------------------------------------------------------------    Component Value Date/Time    BNP 11.0 11/29/2019 1343     ---------------------------------------------------------------------------------------------------------------  Urinalysis    Component Value Date/Time   COLORURINE YELLOW 12/23/2019 1857   APPEARANCEUR CLOUDY (A) 12/23/2019 1857   LABSPEC 1.016 12/23/2019 1857   PHURINE 5.0 12/23/2019 1857  GLUCOSEU NEGATIVE 12/23/2019 1857   HGBUR MODERATE (A) 12/23/2019 1857   BILIRUBINUR NEGATIVE 12/23/2019 1857   BILIRUBINUR negative 03/23/2019 1015   KETONESUR 5 (A) 12/23/2019 1857   PROTEINUR NEGATIVE 12/23/2019 1857   UROBILINOGEN 1.0 03/23/2019 1015   UROBILINOGEN >8.0 (H) 04/15/2007 2055   NITRITE NEGATIVE 12/23/2019 1857   LEUKOCYTESUR LARGE (A) 12/23/2019 1857    ----------------------------------------------------------------------------------------------------------------   Imaging Results:    No results found.  My personal review of EKG: Rhythm NSR, Rate  95 /min, QTc 416   Assessment & Plan:    Active Problems:   Hyperlipidemia   Hypertension   Failure to thrive in adult   COPD (chronic obstructive pulmonary disease) (HCC)   CKD (chronic kidney disease)   Anemia due to chronic renal failure treated with erythropoietin, stage 3 (moderate)   Hyponatremia   Hyponatremia -Most likely hypovolemic hyponatremia, as she does report poor appetite tight overall given she is on dysphagia 2 diet due to lack of her teeth. -Not appear to be started on any new medication to contribute to hyponatremia. -I will start on IV normal saline 75 cc/h, I will obtain a urine osmolality, urine sodium and serum osmolality. -We will give salt tablets x1 overnight, will monitor BMP daily.  Pernicious  anemia -Chronic, at baseline, she is receiving Procrit shots, she is on B12 monthly shots as well given low B12 level as well.  Failure to thrive/generalized weakness -Overall poor appetite, will start on Ensure, continue with dysphagia 2 diet, will consult  nutritionist, she is already on Megace, I will hold for now pending her venous Dopplers. -Consult PT/OT  Left lower extremity edema -She is nonambulatory, on Megace, will check venous Dopplers to rule out DVT.  HTN -stable, continue Norvasc   COPD -stable without acute exacerbation at this time   DVT Prophylaxis Heparin - SCDs   AM Labs Ordered, also please review Full Orders  Family Communication: Admission, patients condition and plan of care including tests being ordered have been discussed with the patient  who indicate understanding and agree with the plan and Code Status.  Code Status full  Likely DC to: back to SNF  Condition GUARDED    Consults called: None  Admission status: inpatient   Time spent in minutes : 60 minutes   Phillips Climes M.D on 02/01/2020 at 7:20 PM   [facility] - Office  [phone]

## 2020-02-02 ENCOUNTER — Inpatient Hospital Stay: Payer: Self-pay

## 2020-02-02 ENCOUNTER — Inpatient Hospital Stay (HOSPITAL_COMMUNITY): Payer: Medicare Other

## 2020-02-02 DIAGNOSIS — E441 Mild protein-calorie malnutrition: Secondary | ICD-10-CM | POA: Insufficient documentation

## 2020-02-02 DIAGNOSIS — J449 Chronic obstructive pulmonary disease, unspecified: Secondary | ICD-10-CM | POA: Diagnosis not present

## 2020-02-02 DIAGNOSIS — E871 Hypo-osmolality and hyponatremia: Secondary | ICD-10-CM | POA: Diagnosis not present

## 2020-02-02 DIAGNOSIS — L89610 Pressure ulcer of right heel, unstageable: Secondary | ICD-10-CM | POA: Insufficient documentation

## 2020-02-02 DIAGNOSIS — N183 Chronic kidney disease, stage 3 unspecified: Secondary | ICD-10-CM | POA: Diagnosis not present

## 2020-02-02 DIAGNOSIS — I824Y2 Acute embolism and thrombosis of unspecified deep veins of left proximal lower extremity: Secondary | ICD-10-CM

## 2020-02-02 DIAGNOSIS — I82402 Acute embolism and thrombosis of unspecified deep veins of left lower extremity: Secondary | ICD-10-CM | POA: Diagnosis present

## 2020-02-02 DIAGNOSIS — E440 Moderate protein-calorie malnutrition: Secondary | ICD-10-CM

## 2020-02-02 DIAGNOSIS — E785 Hyperlipidemia, unspecified: Secondary | ICD-10-CM

## 2020-02-02 DIAGNOSIS — E46 Unspecified protein-calorie malnutrition: Secondary | ICD-10-CM | POA: Insufficient documentation

## 2020-02-02 DIAGNOSIS — L8990 Pressure ulcer of unspecified site, unspecified stage: Secondary | ICD-10-CM | POA: Insufficient documentation

## 2020-02-02 LAB — BASIC METABOLIC PANEL
Anion gap: 9 (ref 5–15)
BUN: 22 mg/dL (ref 8–23)
CO2: 21 mmol/L — ABNORMAL LOW (ref 22–32)
Calcium: 8.5 mg/dL — ABNORMAL LOW (ref 8.9–10.3)
Chloride: 94 mmol/L — ABNORMAL LOW (ref 98–111)
Creatinine, Ser: 1.09 mg/dL — ABNORMAL HIGH (ref 0.44–1.00)
GFR calc Af Amer: 58 mL/min — ABNORMAL LOW (ref >60)
GFR calc non Af Amer: 50 mL/min — ABNORMAL LOW (ref >60)
Glucose, Bld: 101 mg/dL — ABNORMAL HIGH (ref 70–99)
Potassium: 3.9 mmol/L (ref 3.5–5.1)
Sodium: 124 mmol/L — ABNORMAL LOW (ref 135–145)

## 2020-02-02 LAB — CBC
HCT: 23.5 % — ABNORMAL LOW (ref 36.0–46.0)
Hemoglobin: 7.7 g/dL — ABNORMAL LOW (ref 12.0–15.0)
MCH: 24.4 pg — ABNORMAL LOW (ref 26.0–34.0)
MCHC: 32.8 g/dL (ref 30.0–36.0)
MCV: 74.6 fL — ABNORMAL LOW (ref 80.0–100.0)
Platelets: 231 10*3/uL (ref 150–400)
RBC: 3.15 MIL/uL — ABNORMAL LOW (ref 3.87–5.11)
WBC: 7.4 10*3/uL (ref 4.0–10.5)
nRBC: 0 % (ref 0.0–0.2)

## 2020-02-02 LAB — OSMOLALITY: Osmolality: 262 mOsm/kg — ABNORMAL LOW (ref 275–295)

## 2020-02-02 LAB — PREPARE RBC (CROSSMATCH)

## 2020-02-02 LAB — ABO/RH: ABO/RH(D): B POS

## 2020-02-02 MED ORDER — ENOXAPARIN SODIUM 60 MG/0.6ML ~~LOC~~ SOLN: 55.0000 mg | Freq: Two times a day (BID) | SUBCUTANEOUS | Status: DC

## 2020-02-02 MED ORDER — HEPARIN BOLUS VIA INFUSION
1000.0000 [IU] | Freq: Once | INTRAVENOUS | Status: AC
  Administered 2020-02-02: 1000 [IU] via INTRAVENOUS
  Filled 2020-02-02: qty 1000

## 2020-02-02 MED ORDER — JUVEN PO PACK
1.0000 | PACK | Freq: Two times a day (BID) | ORAL | Status: DC
  Administered 2020-02-03 – 2020-02-09 (×12): 1 via ORAL
  Filled 2020-02-02 (×13): qty 1

## 2020-02-02 MED ORDER — HEPARIN (PORCINE) 25000 UT/250ML-% IV SOLN
800.0000 [IU]/h | INTRAVENOUS | Status: DC
  Administered 2020-02-02 – 2020-02-05 (×3): 800 [IU]/h via INTRAVENOUS
  Filled 2020-02-02 (×3): qty 250

## 2020-02-02 MED ORDER — HEPARIN BOLUS VIA INFUSION
4000.0000 [IU] | Freq: Once | INTRAVENOUS | Status: DC
  Filled 2020-02-02: qty 4000

## 2020-02-02 MED ORDER — SODIUM CHLORIDE 0.9% IV SOLUTION: Freq: Once | INTRAVENOUS | Status: DC

## 2020-02-02 NOTE — Care Management Important Message (Signed)
Important Message  Patient Details  Name: Traci Parker MRN: [id number] Date of Birth: 1946/02/10   Medicare Important Message Given:  Yes     Tommy Medal 02/02/2020, 3:50 PM

## 2020-02-02 NOTE — Evaluation (Signed)
Occupational Therapy Evaluation Patient Details Name: Traci Parker MRN: [id number] DOB: 08-01-1945 Today's Date: 02/02/2020    History of Present Illness Traci Parker  is a 74 y.o. female,  with medical history significant of seasonal allergies, pernicious anemia, asthma/COPD, history of blood transfusion, glaucoma, history of hematuria, hypertension, osteoporosis, type 2 diabetes with recent hospitalization due to pernicious anemia and failure to thrive, patient was sent from [facility] for hyponatremia, sodium sodium level was 120 on blood draw this morning at the facility, patient herself denies any complaint, reports she has been feeling at baseline, patient reports at baseline she has poor appetite as she does not like her dysphagia to diet given she has no teeth or dentures, denies any nausea, vomiting, diarrhea, no fever, no chills, no chest pain or shortness of breath, and by reviewing SNF records does not appear she was started on any new medications which would contribute to her hyponatremia.   Clinical Impression   Pt agreeable to OT/PT co-evaluation this am. Pt is from SNF and reports she is not receiving rehab services at the facility. Pt reports basically bedbound or wheelchair bound since admission in June. Pt requiring max to total assist for all ADLs today due to weakness and ataxia. Pt appears to be at baseline with functional task completion, recommend return to SNF on discharge. No further acute OT services required at this time.     Follow Up Recommendations  SNF    Equipment Recommendations  None recommended by OT       Precautions / Restrictions Precautions Precautions: Fall Restrictions Weight Bearing Restrictions: No      Mobility Bed Mobility Overal bed mobility: Needs Assistance Bed Mobility: Supine to Sit     Supine to sit: Mod assist     General bed mobility comments: Patient required mod assist to transition to seated EOB, requires assist to  remain seated  Transfers Overall transfer level: Needs assistance Equipment used: 1 person hand held assist Transfers: Sit to/from Stand;Stand Pivot Transfers Sit to Stand: Max assist Stand pivot transfers: Max assist       General transfer comment: Patient requires max assit to transfer to standing and to pivot to chair at bedside        ADL either performed or assessed with clinical judgement   ADL Overall ADL's : Needs assistance/impaired Eating/Feeding: Maximal assistance;Sitting Eating/Feeding Details (indicate cue type and reason): Pt requiring assist for feeding due to poor grip and coordination. Pt attempted to drink from cup with straw, was unable to bring straw to mouth due to ataxia             Upper Body Dressing : Maximal assistance;Sitting Upper Body Dressing Details (indicate cue type and reason): max assist to donn gown                         Vision Baseline Vision/History: No visual deficits Patient Visual Report: No change from baseline Vision Assessment?: No apparent visual deficits            Pertinent Vitals/Pain Pain Assessment: No/denies pain     Hand Dominance Right   Extremity/Trunk Assessment Upper Extremity Assessment Upper Extremity Assessment: Generalized weakness   Lower Extremity Assessment Lower Extremity Assessment: Defer to PT evaluation   Cervical / Trunk Assessment Cervical / Trunk Assessment: Kyphotic   Communication Communication Communication: No difficulties   Cognition Arousal/Alertness: Awake/alert Behavior During Therapy: WFL for tasks assessed/performed;Anxious Overall Cognitive Status: Within Functional  Limits for tasks assessed                                                Home Living Family/patient expects to be discharged to:: Skilled nursing facility Living Arrangements: Alone Available Help at Discharge: [place]                                     Prior Functioning/Environment Level of Independence: Needs assistance  Gait / Transfers Assistance Needed: Patient states she has been bedbound since going to SNF ADL's / Homemaking Assistance Needed: Assisted by SNF staff for All ADLs including feeding            OT Problem List: Decreased strength;Decreased activity tolerance;Impaired balance (sitting and/or standing)      OT Treatment/Interventions:      OT Goals(Current goals can be found in the care plan section) Acute Rehab OT Goals Patient Stated Goal: home after rehab  OT Frequency:             Co-evaluation PT/OT/SLP Co-Evaluation/Treatment: Yes Reason for Co-Treatment: Complexity of the patient's impairments (multi-system involvement);For patient/therapist safety PT goals addressed during session: Mobility/safety with mobility;Strengthening/ROM OT goals addressed during session: ADL's and self-care         End of Session    Activity Tolerance: Patient tolerated treatment well Patient left: in chair;with call bell/phone within reach;with chair alarm set  OT Visit Diagnosis: Muscle weakness (generalized) (M62.81)                Time: 9038-3338 OT Time Calculation (min): 20 min Charges:  OT General Charges $OT Visit: 1 Visit OT Evaluation $OT Eval Low Complexity: 1 Low   Guadelupe Sabin, OTR/L  [phone] 02/02/2020, 10:20 AM

## 2020-02-02 NOTE — Progress Notes (Signed)
PROGRESS NOTE    Traci Parker  BJY:[id number] DOB: 01-12-1946 DOA: 02/01/2020 PCP: Doree Albee, MD    Brief Narrative:  74 year old female with a history of anemia, hypertension who was recently admitted to the skilled nursing facility for generalized weakness, ataxia and difficulty with ambulation.  She was sent to the emergency room after she was found to be hyponatremic on labs.  She was admitted for IV fluids.  Further work-up indicated that she has left lower extremity DVT.  She is been started on anticoagulation.   Assessment & Plan:   Active Problems:   Hyperlipidemia   Hypertension   Failure to thrive in adult   COPD (chronic obstructive pulmonary disease) (HCC)   CKD (chronic kidney disease)   Anemia due to chronic renal failure treated with erythropoietin, stage 3 (moderate)   Hyponatremia   Malnutrition of moderate degree   Pressure injury of skin   1. Hyponatremia.  Likely related to hypovolemia from poor p.o. intake.  Started on intravenous saline with improvement of sodium.  Her p.o. intake has been poor since she is unable to eat due to lack of teeth.  Will change diet to pured diet. 2. Anemia.  Patient has anemia of chronic disease as well as B12 deficiency.  She has been receiving ESA as an outpatient.  Will transfuse 1 unit of PRBC 3. Left lower extremity DVT.  Patient was nonambulatory and taking Megace as an outpatient.  She was found to have extensive DVT in the left lower extremity.  Started on intravenous heparin.  Discontinue Megace 4. Failure to thrive/generalized weakness.  She was at nursing facility for ataxia and difficulty with ambulation.  She has been followed by neurology.  There history discussing with her daughter, she has not been cleared by her neurologist to undergo physical therapy and therefore has not been receiving therapy at the nursing facility.  MRI of her C-spine T-spine was ordered by the neurologist.  Will need to follow-up with  neurology for clarification of her activity status while undergoing work-up. 5. COPD.  No shortness of breath or wheezing at this time. 6. Hypertension.  Stable on Norvasc. 7. Malnutrition of moderate degree.  Nutrition following.  Pressure injury: Pressure Injury 02/01/20 Sacrum Stage 2 -  Partial thickness loss of dermis presenting as a shallow open injury with a red, pink wound bed without slough. (Active)  02/01/20 2033  Location: Sacrum  Location Orientation:   Staging: Stage 2 -  Partial thickness loss of dermis presenting as a shallow open injury with a red, pink wound bed without slough.  Wound Description (Comments):   Present on Admission: Yes  -Continue supportive measures       DVT prophylaxis: Heparin infusion Code Status: Full code Family Communication: Discussed with daughter over the phone Disposition Plan: Status is: Inpatient  Remains inpatient appropriate because:Inpatient level of care appropriate due to severity of illness   Dispo: The patient is from: SNF              Anticipated d/c is to: SNF              Anticipated d/c date is: 3 days              Patient currently is not medically stable to d/c.    Consultants:     Procedures:     Antimicrobials:       Subjective: Patient denies any shortness of breath, chest pain.  She feels generally weak.  Objective: Vitals:   02/01/20 2135 02/02/20 0217 02/02/20 0908 02/02/20 2019  BP:  (!) 107/60 (!) 97/63   Pulse:  96 90   Resp:  16 18   Temp:  (!) 100.9 F (38.3 C) 98.7 F (37.1 C)   TempSrc:  Oral Oral   SpO2:  100% 100% 100%  Weight: 53.7 kg     Height: 5\' 4"  (1.626 m)       Intake/Output Summary (Last 24 hours) at 02/02/2020 2040 Last data filed at 02/02/2020 0300 Gross per 24 hour  Intake 273.33 ml  Output --  Net 273.33 ml   Filed Weights   02/01/20 1644 02/01/20 2135  Weight: 50.3 kg 53.7 kg    Examination:  General exam: Appears calm and comfortable  Respiratory  system: Clear to auscultation. Respiratory effort normal. Cardiovascular system: S1 & S2 heard, RRR. No JVD, murmurs, rubs, gallops or clicks.  Gastrointestinal system: Abdomen is nondistended, soft and nontender. No organomegaly or masses felt. Normal bowel sounds heard. Central nervous system: Alert and oriented. No focal neurological deficits. Extremities: 2+ edema left lower extremity Skin: No rashes, lesions or ulcers Psychiatry: Judgement and insight appear normal. Mood & affect appropriate.   Data Reviewed: I have personally reviewed following labs and imaging studies  CBC: Recent Labs  Lab 02/01/20 1724 02/02/20 0423  WBC 10.6* 7.4  NEUTROABS 8.3*  --   HGB 9.1* 7.7*  HCT 28.1* 23.5*  MCV 74.1* 74.6*  PLT 271 993   Basic Metabolic Panel: Recent Labs  Lab 02/01/20 0700 02/01/20 1724 02/02/20 0423  NA 120* 120* 124*  K 4.3 4.4 3.9  CL 88* 89* 94*  CO2 22 21* 21*  GLUCOSE 109* 140* 101*  BUN 23 27* 22  CREATININE 1.29* 1.28* 1.09*  CALCIUM 8.6* 8.9 8.5*  MG  --  1.9  --    GFR: Estimated Creatinine Clearance: 37.8 mL/min (A) (by C-G formula based on SCr of 1.09 mg/dL (H)). Liver Function Tests: Recent Labs  Lab 02/01/20 1724  AST 12*  ALT 11  ALKPHOS 72  BILITOT 0.5  PROT 6.8  ALBUMIN 3.4*   No results for input(s): LIPASE, AMYLASE in the last 168 hours. No results for input(s): AMMONIA in the last 168 hours. Coagulation Profile: No results for input(s): INR, PROTIME in the last 168 hours. Cardiac Enzymes: No results for input(s): CKTOTAL, CKMB, CKMBINDEX, TROPONINI in the last 168 hours. BNP (last 3 results) No results for input(s): PROBNP in the last 8760 hours. HbA1C: No results for input(s): HGBA1C in the last 72 hours. CBG: No results for input(s): GLUCAP in the last 168 hours. Lipid Profile: No results for input(s): CHOL, HDL, LDLCALC, TRIG, CHOLHDL, LDLDIRECT in the last 72 hours. Thyroid Function Tests: No results for input(s): TSH,  T4TOTAL, FREET4, T3FREE, THYROIDAB in the last 72 hours. Anemia Panel: No results for input(s): VITAMINB12, FOLATE, FERRITIN, TIBC, IRON, RETICCTPCT in the last 72 hours. Sepsis Labs: No results for input(s): PROCALCITON, LATICACIDVEN in the last 168 hours.  Recent Results (from the past 240 hour(s))  SARS Coronavirus 2 by RT PCR (hospital order, performed in [facility] hospital lab) Nasopharyngeal Nasopharyngeal Swab     Status: None   Collection Time: 02/01/20  6:42 PM   Specimen: Nasopharyngeal Swab  Result Value Ref Range Status   SARS Coronavirus 2 NEGATIVE NEGATIVE Final    Comment: (NOTE) SARS-CoV-2 target nucleic acids are NOT DETECTED.  The SARS-CoV-2 RNA is generally detectable in upper and lower respiratory  specimens during the acute phase of infection. The lowest concentration of SARS-CoV-2 viral copies this assay can detect is 250 copies / mL. A negative result does not preclude SARS-CoV-2 infection and should not be used as the sole basis for treatment or other patient management decisions.  A negative result may occur with improper specimen collection / handling, submission of specimen other than nasopharyngeal swab, presence of viral mutation(s) within the areas targeted by this assay, and inadequate number of viral copies (<250 copies / mL). A negative result must be combined with clinical observations, patient history, and epidemiological information.  Fact Sheet for Patients:   StrictlyIdeas.no  Fact Sheet for Healthcare Providers: BankingDealers.co.za  This test is not yet approved or  cleared by the [place] FDA and has been authorized for detection and/or diagnosis of SARS-CoV-2 by FDA under an Emergency Use Authorization (EUA).  This EUA will remain in effect (meaning this test can be used) for the duration of the COVID-19 declaration under Section 564(b)(1) of the Act, 21 U.S.C. section 360bbb-3(b)(1),  unless the authorization is terminated or revoked sooner.  Performed at [facility], [address] [place], [place] [address]          Radiology Studies: US Venous Img Lower Bilateral (DVT)  Result Date: 02/02/2020 CLINICAL DATA:  Bilateral lower extremity edema EXAM: BILATERAL LOWER EXTREMITY VENOUS DOPPLER ULTRASOUND TECHNIQUE: Gray-scale sonography with graded compression, as well as color Doppler and duplex ultrasound were performed to evaluate the lower extremity deep venous systems from the level of the common femoral vein and including the common femoral, femoral, profunda femoral, popliteal and calf veins including the posterior tibial, peroneal and gastrocnemius veins when visible. The superficial great saphenous vein was also interrogated. Spectral Doppler was utilized to evaluate flow at rest and with distal augmentation maneuvers in the common femoral, femoral and popliteal veins. COMPARISON:  None. FINDINGS: RIGHT LOWER EXTREMITY Common Femoral Vein: No evidence of thrombus. Normal compressibility, respiratory phasicity and response to augmentation. Saphenofemoral Junction: No evidence of thrombus. Normal compressibility and flow on color Doppler imaging. Profunda Femoral Vein: No evidence of thrombus. Normal compressibility and flow on color Doppler imaging. Femoral Vein: No evidence of thrombus. Normal compressibility, respiratory phasicity and response to augmentation. Popliteal Vein: No evidence of thrombus. Normal compressibility, respiratory phasicity and response to augmentation. Calf Veins: No evidence of thrombus. Normal compressibility and flow on color Doppler imaging. Superficial Great Saphenous Vein: No evidence of thrombus. Normal compressibility. Venous Reflux:  None. Other Findings:  None. LEFT LOWER EXTREMITY Common Femoral Vein: Hypoechoic occlusive appearing thrombus. Vessel noncompressible. Saphenofemoral Junction: Thrombus does appear to extend into the  saphenofemoral junction. Profunda Femoral Vein: Hypoechoic occlusive thrombus. Vessel noncompressible. Femoral Vein: Hypoechoic occlusive thrombus. Vessel noncompressible. Popliteal Vein: Similar hypoechoic occlusive thrombus. Vessel noncompressible. Calf Veins: Limited calf vein visualization but thrombus does appear to extend into the peroneal and anterior tibial veins. IMPRESSION: Extensive acute appearing left femoropopliteal occlusive DVT extending into the calf veins. Negative for right lower extremity DVT These results were called by telephone at the time of interpretation on 02/02/2020 at 3:11 pm to provider [facility] , who verbally acknowledged these results. Electronically Signed   By: Jerilynn Mages.  Shick M.D.   On: 02/02/2020 15:14   DG CHEST PORT 1 VIEW  Result Date: 02/02/2020 CLINICAL DATA:  Unexplained fever, COVID negative on 01/31/2020, history diabetes mellitus, COPD, asthma, hypertension, former smoker EXAM: PORTABLE CHEST 1 VIEW COMPARISON:  Portable exam 0941 hours compared to 12/23/2019 FINDINGS: Normal heart  size and pulmonary vascularity. Tortuous aorta with atherosclerotic calcification. Emphysematous changes without pulmonary infiltrate, pleural effusion or pneumothorax. Skin folds project over the lower LEFT lung laterally. Probable RIGHT nipple shadow. Bones demineralized. IMPRESSION: COPD changes without acute infiltrate. Electronically Signed   By: Lavonia Dana M.D.   On: 02/02/2020 10:00   [place] EKG SITE RITE  Result Date: 02/02/2020 If Site Rite image not attached, placement could not be confirmed due to current cardiac rhythm.       Scheduled Meds: . sodium chloride   Intravenous Once  . amLODipine  10 mg Oral Daily  . cyanocobalamin  1,000 mcg Intramuscular Q30 days  . dorzolamide  1 drop Both Eyes BID  . feeding supplement (ENSURE ENLIVE)  237 mL Oral BID BM  . folic acid  1 mg Oral Daily  . gabapentin  100 mg Oral QHS  . latanoprost  1 drop Both Eyes QHS  .  metoCLOPramide  5 mg Oral TID AC  . nutrition supplement (JUVEN)  1 packet Oral BID BM  . pantoprazole  40 mg Oral Daily   Continuous Infusions: . sodium chloride 50 mL/hr at 02/01/20 2132  . heparin 800 Units/hr (02/02/20 1951)     LOS: 1 day    Time spent: 74mins    Kathie Dike, MD [facility]   If 7PM-7AM, please contact night-coverage [email or web address]  02/02/2020, 8:40 PM

## 2020-02-02 NOTE — Evaluation (Signed)
Physical Therapy Evaluation Patient Details Name: Traci Parker MRN: [id number] DOB: [birth year] Today's Date: 02/02/2020   History of Present Illness  Traci Parker  is a 74 y.o. female,  with medical history significant of seasonal allergies, pernicious anemia, asthma/COPD, history of blood transfusion, glaucoma, history of hematuria, hypertension, osteoporosis, type 2 diabetes with recent hospitalization due to pernicious anemia and failure to thrive, patient was sent from [facility] for hyponatremia, sodium sodium level was 120 on blood draw this morning at the facility, patient herself denies any complaint, reports she has been feeling at baseline, patient reports at baseline she has poor appetite as she does not like her dysphagia to diet given she has no teeth or dentures, denies any nausea, vomiting, diarrhea, no fever, no chills, no chest pain or shortness of breath, and by reviewing SNF records does not appear she was started on any new medications which would contribute to her hyponatremia.    Clinical Impression  Patient limited for functional mobility as stated below secondary to BLE weakness, fatigue and poor standing balance. Patient requires mod assist to transition to seated EOB. She requires assist to remain seated EOB secondary to impaired seated balance. She requires max assist to transfer to standing at EOB where she demonstrates limited standing tolerance requiring max assist to remain standing to transfer to chair at bedside. Patient shows good sitting tolerance but is limited by fatigue and weakness throughout session. Patient will benefit from continued physical therapy in hospital and recommended venue below to increase strength, balance, endurance for safe ADLs and gait.     Follow Up Recommendations SNF    Equipment Recommendations  None recommended by PT    Recommendations for Other Services       Precautions / Restrictions Precautions Precautions:  Fall Restrictions Weight Bearing Restrictions: No      Mobility  Bed Mobility Overal bed mobility: Needs Assistance Bed Mobility: Supine to Sit     Supine to sit: Mod assist     General bed mobility comments: Patient required mod assist to transition to seated EOB, requires assist to remain seated  Transfers Overall transfer level: Needs assistance Equipment used: 1 person hand held assist Transfers: Sit to/from Stand;Stand Pivot Transfers Sit to Stand: Max assist Stand pivot transfers: Max assist       General transfer comment: Patient requires max assit to transfer to standing and to pivot to chair at bedside  Ambulation/Gait                Stairs            Wheelchair Mobility    Modified Rankin (Stroke Patients Only)       Balance Overall balance assessment: Needs assistance;History of Falls Sitting-balance support: Feet supported;Bilateral upper extremity supported Sitting balance-Leahy Scale: Poor Sitting balance - Comments: seated EOB   Standing balance support: Bilateral upper extremity supported Standing balance-Leahy Scale: Zero Standing balance comment: unable to stand without max assist due to weakness                             Pertinent Vitals/Pain Pain Assessment: No/denies pain    Home Living Family/patient expects to be discharged to:: Skilled nursing facility Living Arrangements: Alone Available Help at Discharge: [place]                  Prior Function Level of Independence: Needs assistance   Gait / Transfers Assistance  Needed: Patient states she has been bedbound since going to SNF  ADL's / Homemaking Assistance Needed: Assisted by SNF staff        Hand Dominance   Dominant Hand: Right    Extremity/Trunk Assessment   Upper Extremity Assessment Upper Extremity Assessment: Defer to OT evaluation    Lower Extremity Assessment Lower Extremity Assessment: Generalized  weakness    Cervical / Trunk Assessment Cervical / Trunk Assessment: Kyphotic  Communication   Communication: No difficulties  Cognition Arousal/Alertness: Awake/alert Behavior During Therapy: WFL for tasks assessed/performed;Anxious Overall Cognitive Status: Within Functional Limits for tasks assessed                                        General Comments      Exercises     Assessment/Plan    PT Assessment Patient needs continued PT services  PT Problem List Decreased strength;Decreased range of motion;Decreased activity tolerance;Decreased balance;Decreased mobility;Decreased knowledge of use of DME       PT Treatment Interventions DME instruction;Gait training;Functional mobility training;Therapeutic activities;Therapeutic exercise;Balance training;Neuromuscular re-education;Patient/family education;Modalities    PT Goals (Current goals can be found in the Care Plan section)  Acute Rehab PT Goals Patient Stated Goal: home after rehab PT Goal Formulation: With patient Time For Goal Achievement: 02/16/20 Potential to Achieve Goals: Good    Frequency Min 3X/week   Barriers to discharge        Co-evaluation PT/OT/SLP Co-Evaluation/Treatment: Yes Reason for Co-Treatment: Complexity of the patient's impairments (multi-system involvement);To address functional/ADL transfers PT goals addressed during session: Mobility/safety with mobility;Strengthening/ROM         AM-PAC PT "6 Clicks" Mobility  Outcome Measure Help needed turning from your back to your side while in a flat bed without using bedrails?: A Little Help needed moving from lying on your back to sitting on the side of a flat bed without using bedrails?: A Lot Help needed moving to and from a bed to a chair (including a wheelchair)?: A Lot Help needed standing up from a chair using your arms (e.g., wheelchair or bedside chair)?: A Lot Help needed to walk in hospital room?: Total Help needed  climbing 3-5 steps with a railing? : Total 6 Click Score: 11    End of Session Equipment Utilized During Treatment: Gait belt Activity Tolerance: Patient limited by fatigue Patient left: in chair;with call bell/phone within reach;with chair alarm set Nurse Communication: Mobility status PT Visit Diagnosis: Unsteadiness on feet (R26.81);Other abnormalities of gait and mobility (R26.89);Muscle weakness (generalized) (M62.81);History of falling (Z91.81)    Time: 0801-0820 PT Time Calculation (min) (ACUTE ONLY): 19 min   Charges:   PT Evaluation $PT Eval Moderate Complexity: 1 Mod          8:40 AM, 02/02/20 Mearl Latin PT, DPT Physical Therapist at [facility]

## 2020-02-02 NOTE — TOC Progression Note (Signed)
Transition of Care [facility]) - Progression Note    Patient Details  Name: Traci Parker MRN: [id number] Date of Birth: [birth year]  Transition of Care [facility]) CM/SW Contact  Natasha Bence, LCSW Phone Number: 02/02/2020, 1:56 PM  Clinical Narrative:    CSW was advised that patient was not receiving PT while at [facility]. CSW contacted [facility] to inquire about the reason why the patient was not receiving PT. Kerri with [facility] reported that patient was not receiving PT due to being denied after her clinicals were updated during her stay at the facility. Marianna Fuss also reported that this was because of the lack of progress patient had made. TOC to follow.         Expected Discharge Plan and Services                                                 Social Determinants of Health (SDOH) Interventions    Readmission Risk Interventions Readmission Risk Prevention Plan 12/25/2019  Transportation Screening Complete  Home Care Screening Not Complete  Home Care Screening Not Completed Comments SNF referral  Medication Review (RN CM) Complete  Some recent data might be hidden

## 2020-02-02 NOTE — Progress Notes (Signed)
Initial Nutrition Assessment  DOCUMENTATION CODES:   Non-severe (moderate) malnutrition in context of chronic illness  INTERVENTION:  Ensure Enlive po BID, each supplement provides 350 kcal and 20 grams of protein   Magic cup TID with meals, each supplement provides 290 kcal and 9 grams of protein   Trial of pureed protein and vegetable for dinner  NUTRITION DIAGNOSIS:   Moderate Malnutrition related to chronic illness (intermittent nausea and vomiting, poor appetite  the past 3 months) as evidenced by meal intake 1-25%,  Unplanned wt loss 27% per family report, moderate and severe muscle loss.   GOAL:  Patient will meet greater than or equal to 90% of their needs   MONITOR:  PO intake, Supplement acceptance, Weight trends, Skin, Labs  REASON FOR ASSESSMENT:   Consult Assessment of nutrition requirement/status  ASSESSMENT: Patient from SNF. Hx of nausea and vomiting, FTT, Malnutrition, COPD, DM-2, HTN and multiple falls. Presents with hyponatremia, generalized weakness.    Patient receiving modified texture diet. Unable to feed herself. Prefers to eat at a slow pace to help improve tolerance of intake. Her meal intake has been consistently low 1-25% meals. Denies vomiting since admission. High risk for worsening nutrition status and delay for wound healing given her ongoing poor appetite and limited intake.  RD observed lunch tray today- she ate 75% of her soup and a couple bites of vanilla pudding. Patient edentulous. She complains of not being able to eat the ground textures foods and is willing to try puree. She doesn't like grits, cottage cheese or yogurt and is getting tired of mashed potatoes.   Severe weight loss the past 8 months ~27%/ > 30 lbs. Patient had appointment with Dr. Laural Golden yesterday. Facility wt 109.2 lb on 7/15. Moderate edema BLE.  PT- findings of "BLE weakness, fatigue and poor standing balance".   Labs reviewed:  BMP Latest Ref Rng & Units 02/02/2020  02/01/2020 02/01/2020  Glucose 70 - 99 mg/dL 101(H) 140(H) 109(H)  BUN 8 - 23 mg/dL 22 27(H) 23  Creatinine 0.44 - 1.00 mg/dL 1.09(H) 1.28(H) 1.29(H)  BUN/Creat Ratio 6 - 22 (calc) - - -  Sodium 135 - 145 mmol/L 124(L) 120(L) 120(L)  Potassium 3.5 - 5.1 mmol/L 3.9 4.4 4.3  Chloride 98 - 111 mmol/L 94(L) 89(L) 88(L)  CO2 22 - 32 mmol/L 21(L) 21(L) 22  Calcium 8.9 - 10.3 mg/dL 8.5(L) 8.9 8.6(L)    Medications reviewed and include: Vitamin B-12, Folvite, Protonix, Reglan  NUTRITION - FOCUSED PHYSICAL EXAM:    Most Recent Value  Orbital Region Moderate depletion  Upper Arm Region Mild depletion  Buccal Region Mild depletion  Temple Region Moderate depletion  Clavicle Bone Region Severe depletion  Clavicle and Acromion Bone Region Moderate depletion  Dorsal Hand No depletion  Patellar Region No depletion  Anterior Thigh Region Moderate depletion  Posterior Calf Region Moderate depletion  Edema (RD Assessment) Moderate  Hair Reviewed  Eyes Reviewed  Mouth Reviewed  [endentulous]  Skin Reviewed  Nails Reviewed     Diet Order:   Diet Order            DIET DYS 2 Room service appropriate? Yes; Fluid consistency: Thin  Diet effective now                 EDUCATION NEEDS:  Education needs have been addressed Skin:  Skin Assessment: Skin Integrity Issues: Skin Integrity Issues:: Stage II Stage II: sacrum  Last BM:  7/16 small per facility records  Height:  Ht Readings from Last 1 Encounters:  02/01/20 5\' 4"  (1.626 m)    Weight:   Wt Readings from Last 1 Encounters:  02/01/20 53.7 kg    Ideal Body Weight:   55 kg  BMI:  Body mass index is 20.32 kg/m.  Estimated Nutritional Needs:   Kcal:  3299-2426  Protein:  75-86 gr  Fluid:  >1600 ml daily  Colman Cater MS,RD,CSG,LDN Pager: available through [employer]

## 2020-02-02 NOTE — Plan of Care (Signed)
  Problem: Acute Rehab PT Goals(only PT should resolve) Goal: Pt Will Go Supine/Side To Sit Outcome: Progressing Flowsheets (Taken 02/02/2020 0842) Pt will go Supine/Side to Sit: with minimal assist Goal: Pt Will Go Sit To Supine/Side Outcome: Progressing Flowsheets (Taken 02/02/2020 0842) Pt will go Sit to Supine/Side: with minimal assist Goal: Patient Will Perform Sitting Balance Outcome: Progressing Flowsheets (Taken 02/02/2020 0842) Patient will perform sitting balance: with minimal assist Goal: Patient Will Transfer Sit To/From Stand Outcome: Progressing Flowsheets (Taken 02/02/2020 0842) Patient will transfer sit to/from stand: with moderate assist Goal: Pt Will Transfer Bed To Chair/Chair To Bed Outcome: Progressing Flowsheets (Taken 02/02/2020 0842) Pt will Transfer Bed to Chair/Chair to Bed: with mod assist  8:43 AM, 02/02/20 Mearl Latin PT, DPT Physical Therapist at [facility]

## 2020-02-02 NOTE — Progress Notes (Addendum)
ANTICOAGULATION CONSULT NOTE - Initial Consult  Pharmacy Consult for heparin gtt  Indication: DVT  Allergies  Allergen Reactions  . Latex Rash  . Levofloxacin Nausea And Vomiting  . Neomycin-Bacitracin-Polymyxin  [Bacitracin-Neomycin-Polymyxin] Rash  . Other Rash and Swelling  . Prednisone Hives, Itching, Rash and Other (See Comments)  . Fish Allergy     rash  . Neosporin [Neomycin-Polymyxin-Gramicidin] Itching and Rash    Patient Measurements: Height: 5\' 4"  (162.6 cm) Weight: 53.7 kg (118 lb 6.2 oz) IBW/kg (Calculated) : 54.7 Heparin Dosing Weight: HEPARIN DW (KG): 53.7   Vital Signs: Temp: 98.7 F (37.1 C) (07/23 0908) Temp Source: Oral (07/23 0908) BP: 97/63 (07/23 0908) Pulse Rate: 90 (07/23 0908)  Labs: Recent Labs    02/01/20 0700 02/01/20 1724 02/02/20 0423  HGB  --  9.1* 7.7*  HCT  --  28.1* 23.5*  PLT  --  271 231  CREATININE 1.29* 1.28* 1.09*    Estimated Creatinine Clearance: 37.8 mL/min (A) (by C-G formula based on SCr of 1.09 mg/dL (H)).   Medical History: Past Medical History:  Diagnosis Date  . Allergy   . Anemia    pernicious anemia  . Asthma   . Blood transfusion without reported diagnosis   . CKD (chronic kidney disease)   . COPD (chronic obstructive pulmonary disease) ([place]) 12/23/2019  . Gastritis   . Glaucoma   . History of blood in urine   . Hyperlipidemia   . Hypertension   . Ocular hypertension   . Osteoporosis   . Type 2 diabetes mellitus ([place]) 12/23/2019  . Vitamin B12 deficiency anemia due to intrinsic factor deficiency     Medications:  Medications Prior to Admission  Medication Sig Dispense Refill Last Dose  . acetaminophen (TYLENOL) 325 MG tablet Take 2 tablets (650 mg total) by mouth every 6 (six) hours as needed for mild pain (or Fever >/= 101). (Patient not taking: Reported on 01/24/2020) 12 tablet 0   . albuterol (VENTOLIN HFA) 108 (90 Base) MCG/ACT inhaler Inhale 2 puffs into the lungs every 4 (four) hours as needed  for wheezing or shortness of breath. (Patient not taking: Reported on 01/24/2020) 8 g 2   . ALPRAZolam (XANAX) 0.5 MG tablet for sedation before MRI scan; take 1 tab 1 hour before scan; may repeat 1 tab 15 min before scan 3 tablet 0   . amLODipine (NORVASC) 10 MG tablet Take 1 tablet (10 mg total) by mouth daily. 30 tablet 1   . cyanocobalamin (,VITAMIN B-12,) 1000 MCG/ML injection Inject 1 mL (1,000 mcg total) into the muscle every 30 (thirty) days. 30 mL 0   . dorzolamide (TRUSOPT) 2 % ophthalmic solution Place 1 drop into both eyes 2 (two) times daily.     . feeding supplement, ENSURE ENLIVE, (ENSURE ENLIVE) LIQD Take 237 mLs by mouth 2 (two) times daily between meals.     . folic acid (FOLVITE) 1 MG tablet Take 1 mg by mouth daily.      Marland Kitchen gabapentin (NEURONTIN) 100 MG capsule Take 100 mg by mouth at bedtime.     Marland Kitchen latanoprost (XALATAN) 0.005 % ophthalmic solution Place 1 drop into both eyes at bedtime.     . megestrol (MEGACE) 400 MG/10ML suspension Take 10 mLs (400 mg total) by mouth daily. 480 mL 0   . metoCLOPramide (REGLAN) 5 MG tablet Take 1 tablet (5 mg total) by mouth 3 (three) times daily before meals. 90 tablet 1   . NON FORMULARY Diet Change:  Dys 2 (ground), continue thin liquids; add extra gravy to meats     . omeprazole (PRILOSEC) 40 MG capsule Take 40 mg by mouth daily.      . ondansetron (ZOFRAN ODT) 4 MG disintegrating tablet Take 1 tablet (4 mg total) by mouth every 8 (eight) hours as needed for nausea or vomiting. Dissolve under tongue (Patient not taking: Reported on 01/24/2020) 60 tablet 2    Scheduled:  . amLODipine  10 mg Oral Daily  . cyanocobalamin  1,000 mcg Intramuscular Q30 days  . dorzolamide  1 drop Both Eyes BID  . feeding supplement (ENSURE ENLIVE)  237 mL Oral BID BM  . folic acid  1 mg Oral Daily  . gabapentin  100 mg Oral QHS  . heparin  4,000 Units Intravenous Once  . latanoprost  1 drop Both Eyes QHS  . metoCLOPramide  5 mg Oral TID AC  . nutrition  supplement (JUVEN)  1 packet Oral BID BM  . pantoprazole  40 mg Oral Daily   Infusions:  . sodium chloride 50 mL/hr at 02/01/20 2132  . heparin     PRN: albuterol, ALPRAZolam Anti-infectives (From admission, onward)   None      Assessment: CHRISTEENA KROGH a 74 y.o. female requires anticoagulation with a heparin iv infusion for the indication of  DVT. Heparin gtt will be started following pharmacy protocol per pharmacy consult. Patient is not on previous oral anticoagulant that will require aPTT/HL correlation before transitioning to only HL monitoring.   7/23@1430 : Patient just got 5000 units subq from heparin 5000u subq q8h order. Will reduce bolus.   Goal of Therapy:  Heparin level 0.3-0.7 units/ml Monitor platelets by anticoagulation protocol: Yes   Plan:  Give 1000 units bolus x 1 Start heparin infusion at 800 units/hr Check anti-Xa level in 8 hours and daily while on heparin Continue to monitor H&H and platelets  Heparin level to be drawn in 8 hours for patients >53 years old or crcl < 49ml/min  Donna Christen Chance Munter 02/02/2020,3:36 PM

## 2020-02-03 DIAGNOSIS — I824Y2 Acute embolism and thrombosis of unspecified deep veins of left proximal lower extremity: Secondary | ICD-10-CM | POA: Diagnosis not present

## 2020-02-03 DIAGNOSIS — J449 Chronic obstructive pulmonary disease, unspecified: Secondary | ICD-10-CM | POA: Diagnosis not present

## 2020-02-03 DIAGNOSIS — E871 Hypo-osmolality and hyponatremia: Secondary | ICD-10-CM | POA: Diagnosis not present

## 2020-02-03 DIAGNOSIS — N183 Chronic kidney disease, stage 3 unspecified: Secondary | ICD-10-CM | POA: Diagnosis not present

## 2020-02-03 LAB — TYPE AND SCREEN
ABO/RH(D): B POS
Antibody Screen: NEGATIVE
Unit division: 0

## 2020-02-03 LAB — BASIC METABOLIC PANEL
Anion gap: 10 (ref 5–15)
BUN: 16 mg/dL (ref 8–23)
CO2: 21 mmol/L — ABNORMAL LOW (ref 22–32)
Calcium: 8.5 mg/dL — ABNORMAL LOW (ref 8.9–10.3)
Chloride: 97 mmol/L — ABNORMAL LOW (ref 98–111)
Creatinine, Ser: 0.92 mg/dL (ref 0.44–1.00)
GFR calc Af Amer: >60 mL/min (ref >60)
GFR calc non Af Amer: >60 mL/min (ref >60)
Glucose, Bld: 97 mg/dL (ref 70–99)
Potassium: 3.5 mmol/L (ref 3.5–5.1)
Sodium: 128 mmol/L — ABNORMAL LOW (ref 135–145)

## 2020-02-03 LAB — CBC
HCT: 28.7 % — ABNORMAL LOW (ref 36.0–46.0)
Hemoglobin: 9.5 g/dL — ABNORMAL LOW (ref 12.0–15.0)
MCH: 25.3 pg — ABNORMAL LOW (ref 26.0–34.0)
MCHC: 33.1 g/dL (ref 30.0–36.0)
MCV: 76.5 fL — ABNORMAL LOW (ref 80.0–100.0)
Platelets: 236 10*3/uL (ref 150–400)
RBC: 3.75 MIL/uL — ABNORMAL LOW (ref 3.87–5.11)
WBC: 4.8 10*3/uL (ref 4.0–10.5)
nRBC: 0 % (ref 0.0–0.2)

## 2020-02-03 LAB — HEPARIN LEVEL (UNFRACTIONATED)
Heparin Unfractionated: 0.67 IU/mL (ref 0.30–0.70)
Heparin Unfractionated: 0.65 IU/mL (ref 0.30–0.70)

## 2020-02-03 LAB — BPAM RBC
Blood Product Expiration Date: 202108272359
ISSUE DATE / TIME: 202107232216
Unit Type and Rh: 5100

## 2020-02-03 MED ORDER — CHLORHEXIDINE GLUCONATE CLOTH 2 % EX PADS
6.0000 | MEDICATED_PAD | Freq: Every day | CUTANEOUS | Status: DC
  Administered 2020-02-03 – 2020-02-09 (×7): 6 via TOPICAL

## 2020-02-03 NOTE — Progress Notes (Signed)
PROGRESS NOTE    Traci Parker  WER:[id number] DOB: [birth year] DOA: 02/01/2020 PCP: Doree Albee, MD    Brief Narrative:  74 year old female with a history of anemia, hypertension who was recently admitted to the skilled nursing facility for generalized weakness, ataxia and difficulty with ambulation.  She was sent to the emergency room after she was found to be hyponatremic on labs.  She was admitted for IV fluids.  Further work-up indicated that she has left lower extremity DVT.  She is been started on anticoagulation.   Assessment & Plan:   Active Problems:   Hyperlipidemia   Hypertension   Failure to thrive in adult   COPD (chronic obstructive pulmonary disease) (HCC)   CKD (chronic kidney disease)   Anemia due to chronic renal failure treated with erythropoietin, stage 3 (moderate)   Hyponatremia   Malnutrition of moderate degree   Pressure injury of skin   Acute deep vein thrombosis (DVT) of left lower extremity ([place])   1. Hyponatremia.  Likely related to hypovolemia from poor p.o. intake.  Started on intravenous saline with improvement of sodium.  Her p.o. intake has been poor since she is unable to eat due to lack of teeth.  Will change diet to pured diet. 2. Anemia.  Patient has anemia of chronic disease as well as B12 deficiency.  She has been receiving ESA as an outpatient.  Hemoglobin on admission trended down to 7.7 with IV hydration.  She received 1 unit PRBC with improvement of hemoglobin to 9.5.  Continue to monitor. 3. Left lower extremity DVT.  Patient was nonambulatory and taking Megace as an outpatient.  She was found to have extensive DVT in the left lower extremity.  Started on intravenous heparin.  Discontinue Megace.  Case reviewed with Dr. Carlis Abbott on-call for vascular surgery.  Due to her comorbidities, poor functional status, and lack of significant symptoms, recommendations were for continued anticoagulation.  Does not appear to be a candidate for  thrombectomy at this time. 4. Failure to thrive/generalized weakness.  She was at nursing facility for ataxia and difficulty with ambulation.  She has been followed by neurology.  After discussing with her daughter, patient has not been cleared by her neurologist to undergo physical therapy and therefore has not been receiving therapy at the nursing facility.  MRI of her C-spine T-spine was ordered by the neurologist.  Will need to follow-up with neurology for clarification of her activity status while undergoing neurologic work-up. 5. COPD.  No shortness of breath or wheezing at this time. 6. Hypertension.  Stable on Norvasc. 7. Malnutrition of moderate degree.  Nutrition following.  Pressure injury: Pressure Injury 02/01/20 Sacrum Stage 2 -  Partial thickness loss of dermis presenting as a shallow open injury with a red, pink wound bed without slough. (Active)  02/01/20 2033  Location: Sacrum  Location Orientation:   Staging: Stage 2 -  Partial thickness loss of dermis presenting as a shallow open injury with a red, pink wound bed without slough.  Wound Description (Comments):   Present on Admission: Yes  -Continue supportive measures       DVT prophylaxis: Heparin infusion Code Status: Full code Family Communication: Discussed with daughter over the phone Disposition Plan: Status is: Inpatient  Remains inpatient appropriate because:Inpatient level of care appropriate due to severity of illness   Dispo: The patient is from: SNF              Anticipated d/c is to: SNF  Anticipated d/c date is: 3 days              Patient currently is not medically stable to d/c.    Consultants:     Procedures:     Antimicrobials:       Subjective: Denies any pain in her left leg.  No shortness of breath.  No chest pain.  Objective: Vitals:   02/02/20 2235 02/03/20 0134 02/03/20 0510 02/03/20 1424  BP: (!) 102/61 103/65 (!) 100/60 (!) 107/64  Pulse: 90 93 87 78    Resp: 20 18 18 17   Temp: 99 F (37.2 C) 98.5 F (36.9 C) 98.8 F (37.1 C) 98.3 F (36.8 C)  TempSrc: Oral Oral Oral Oral  SpO2: 100% 100% 100% 100%  Weight:      Height:        Intake/Output Summary (Last 24 hours) at 02/03/2020 1906 Last data filed at 02/03/2020 1700 Gross per 24 hour  Intake 1517.31 ml  Output 601 ml  Net 916.31 ml   Filed Weights   02/01/20 1644 02/01/20 2135  Weight: 50.3 kg 53.7 kg    Examination:  General exam: Alert, awake, oriented x 3 Respiratory system: Clear to auscultation. Respiratory effort normal. Cardiovascular system:RRR. No murmurs, rubs, gallops. Gastrointestinal system: Abdomen is nondistended, soft and nontender. No organomegaly or masses felt. Normal bowel sounds heard. Central nervous system: Alert and oriented. No focal neurological deficits. Extremities: 1+ edema in the LLE Skin: No rashes, lesions or ulcers Psychiatry: Judgement and insight appear normal. Mood & affect appropriate.     Data Reviewed: I have personally reviewed following labs and imaging studies  CBC: Recent Labs  Lab 02/01/20 1724 02/02/20 0423 02/03/20 0716  WBC 10.6* 7.4 4.8  NEUTROABS 8.3*  --   --   HGB 9.1* 7.7* 9.5*  HCT 28.1* 23.5* 28.7*  MCV 74.1* 74.6* 76.5*  PLT 271 231 673   Basic Metabolic Panel: Recent Labs  Lab 02/01/20 0700 02/01/20 1724 02/02/20 0423 02/03/20 0716  NA 120* 120* 124* 128*  K 4.3 4.4 3.9 3.5  CL 88* 89* 94* 97*  CO2 22 21* 21* 21*  GLUCOSE 109* 140* 101* 97  BUN 23 27* 22 16  CREATININE 1.29* 1.28* 1.09* 0.92  CALCIUM 8.6* 8.9 8.5* 8.5*  MG  --  1.9  --   --    GFR: Estimated Creatinine Clearance: 44.8 mL/min (by C-G formula based on SCr of 0.92 mg/dL). Liver Function Tests: Recent Labs  Lab 02/01/20 1724  AST 12*  ALT 11  ALKPHOS 72  BILITOT 0.5  PROT 6.8  ALBUMIN 3.4*   No results for input(s): LIPASE, AMYLASE in the last 168 hours. No results for input(s): AMMONIA in the last 168  hours. Coagulation Profile: No results for input(s): INR, PROTIME in the last 168 hours. Cardiac Enzymes: No results for input(s): CKTOTAL, CKMB, CKMBINDEX, TROPONINI in the last 168 hours. BNP (last 3 results) No results for input(s): PROBNP in the last 8760 hours. HbA1C: No results for input(s): HGBA1C in the last 72 hours. CBG: No results for input(s): GLUCAP in the last 168 hours. Lipid Profile: No results for input(s): CHOL, HDL, LDLCALC, TRIG, CHOLHDL, LDLDIRECT in the last 72 hours. Thyroid Function Tests: No results for input(s): TSH, T4TOTAL, FREET4, T3FREE, THYROIDAB in the last 72 hours. Anemia Panel: No results for input(s): VITAMINB12, FOLATE, FERRITIN, TIBC, IRON, RETICCTPCT in the last 72 hours. Sepsis Labs: No results for input(s): PROCALCITON, LATICACIDVEN in the last 168 hours.  Recent Results (from the past 240 hour(s))  SARS Coronavirus 2 by RT PCR (hospital order, performed in [facility] hospital lab) Nasopharyngeal Nasopharyngeal Swab     Status: None   Collection Time: 02/01/20  6:42 PM   Specimen: Nasopharyngeal Swab  Result Value Ref Range Status   SARS Coronavirus 2 NEGATIVE NEGATIVE Final    Comment: (NOTE) SARS-CoV-2 target nucleic acids are NOT DETECTED.  The SARS-CoV-2 RNA is generally detectable in upper and lower respiratory specimens during the acute phase of infection. The lowest concentration of SARS-CoV-2 viral copies this assay can detect is 250 copies / mL. A negative result does not preclude SARS-CoV-2 infection and should not be used as the sole basis for treatment or other patient management decisions.  A negative result may occur with improper specimen collection / handling, submission of specimen other than nasopharyngeal swab, presence of viral mutation(s) within the areas targeted by this assay, and inadequate number of viral copies (<250 copies / mL). A negative result must be combined with clinical observations, patient history,  and epidemiological information.  Fact Sheet for Patients:   StrictlyIdeas.no  Fact Sheet for Healthcare Providers: BankingDealers.co.za  This test is not yet approved or  cleared by the [place] FDA and has been authorized for detection and/or diagnosis of SARS-CoV-2 by FDA under an Emergency Use Authorization (EUA).  This EUA will remain in effect (meaning this test can be used) for the duration of the COVID-19 declaration under Section 564(b)(1) of the Act, 21 U.S.C. section 360bbb-3(b)(1), unless the authorization is terminated or revoked sooner.  Performed at [facility], [address], [place], [place] [address]          Radiology Studies: US Venous Img Lower Bilateral (DVT)  Result Date: 02/02/2020 CLINICAL DATA:  Bilateral lower extremity edema EXAM: BILATERAL LOWER EXTREMITY VENOUS DOPPLER ULTRASOUND TECHNIQUE: Gray-scale sonography with graded compression, as well as color Doppler and duplex ultrasound were performed to evaluate the lower extremity deep venous systems from the level of the common femoral vein and including the common femoral, femoral, profunda femoral, popliteal and calf veins including the posterior tibial, peroneal and gastrocnemius veins when visible. The superficial great saphenous vein was also interrogated. Spectral Doppler was utilized to evaluate flow at rest and with distal augmentation maneuvers in the common femoral, femoral and popliteal veins. COMPARISON:  None. FINDINGS: RIGHT LOWER EXTREMITY Common Femoral Vein: No evidence of thrombus. Normal compressibility, respiratory phasicity and response to augmentation. Saphenofemoral Junction: No evidence of thrombus. Normal compressibility and flow on color Doppler imaging. Profunda Femoral Vein: No evidence of thrombus. Normal compressibility and flow on color Doppler imaging. Femoral Vein: No evidence of thrombus. Normal compressibility, respiratory  phasicity and response to augmentation. Popliteal Vein: No evidence of thrombus. Normal compressibility, respiratory phasicity and response to augmentation. Calf Veins: No evidence of thrombus. Normal compressibility and flow on color Doppler imaging. Superficial Great Saphenous Vein: No evidence of thrombus. Normal compressibility. Venous Reflux:  None. Other Findings:  None. LEFT LOWER EXTREMITY Common Femoral Vein: Hypoechoic occlusive appearing thrombus. Vessel noncompressible. Saphenofemoral Junction: Thrombus does appear to extend into the saphenofemoral junction. Profunda Femoral Vein: Hypoechoic occlusive thrombus. Vessel noncompressible. Femoral Vein: Hypoechoic occlusive thrombus. Vessel noncompressible. Popliteal Vein: Similar hypoechoic occlusive thrombus. Vessel noncompressible. Calf Veins: Limited calf vein visualization but thrombus does appear to extend into the peroneal and anterior tibial veins. IMPRESSION: Extensive acute appearing left femoropopliteal occlusive DVT extending into the calf veins. Negative for right lower extremity DVT These results were  called by telephone at the time of interpretation on 02/02/2020 at 3:11 pm to provider DAWOOD [facility] , who verbally acknowledged these results. Electronically Signed   By: Jerilynn Mages.  Shick M.D.   On: 02/02/2020 15:14   DG CHEST PORT 1 VIEW  Result Date: 02/02/2020 CLINICAL DATA:  Unexplained fever, COVID negative on 01/31/2020, history diabetes mellitus, COPD, asthma, hypertension, former smoker EXAM: PORTABLE CHEST 1 VIEW COMPARISON:  Portable exam 0941 hours compared to 12/23/2019 FINDINGS: Normal heart size and pulmonary vascularity. Tortuous aorta with atherosclerotic calcification. Emphysematous changes without pulmonary infiltrate, pleural effusion or pneumothorax. Skin folds project over the lower LEFT lung laterally. Probable RIGHT nipple shadow. Bones demineralized. IMPRESSION: COPD changes without acute infiltrate. Electronically Signed    By: Lavonia Dana M.D.   On: 02/02/2020 10:00   [place] EKG SITE RITE  Result Date: 02/02/2020 If Site Rite image not attached, placement could not be confirmed due to current cardiac rhythm.       Scheduled Meds: . sodium chloride   Intravenous Once  . amLODipine  10 mg Oral Daily  . Chlorhexidine Gluconate Cloth  6 each Topical Daily  . cyanocobalamin  1,000 mcg Intramuscular Q30 days  . dorzolamide  1 drop Both Eyes BID  . feeding supplement (ENSURE ENLIVE)  237 mL Oral BID BM  . folic acid  1 mg Oral Daily  . gabapentin  100 mg Oral QHS  . latanoprost  1 drop Both Eyes QHS  . metoCLOPramide  5 mg Oral TID AC  . nutrition supplement (JUVEN)  1 packet Oral BID BM  . pantoprazole  40 mg Oral Daily   Continuous Infusions: . sodium chloride 50 mL/hr at 02/03/20 1424  . heparin 800 Units/hr (02/02/20 2318)     LOS: 2 days    Time spent: 75mins    Kathie Dike, MD [facility]   If 7PM-7AM, please contact night-coverage [email or web address]  02/03/2020, 7:06 PM

## 2020-02-03 NOTE — Progress Notes (Signed)
[place] for heparin gtt  Indication: DVT  Allergies  Allergen Reactions  . Latex Rash  . Levofloxacin Nausea And Vomiting  . Neomycin-Bacitracin-Polymyxin  [Bacitracin-Neomycin-Polymyxin] Rash  . Other Rash and Swelling  . Prednisone Hives, Itching, Rash and Other (See Comments)  . Fish Allergy     rash  . Neosporin [Neomycin-Polymyxin-Gramicidin] Itching and Rash    Patient Measurements: Height: 5\' 4"  (162.6 cm) Weight: 53.7 kg (118 lb 6.2 oz) IBW/kg (Calculated) : 54.7 Heparin Dosing Weight: HEPARIN DW (KG): 53.7   Vital Signs: Temp: 98.3 F (36.8 C) (07/24 1424) Temp Source: Oral (07/24 1424) BP: 107/64 (07/24 1424) Pulse Rate: 78 (07/24 1424)  Labs: Recent Labs    02/01/20 1724 02/01/20 1724 02/02/20 0423 02/03/20 0716 02/03/20 1421  HGB 9.1*   < > 7.7* 9.5*  --   HCT 28.1*  --  23.5* 28.7*  --   PLT 271  --  231 236  --   HEPARINUNFRC  --   --   --  0.67 0.65  CREATININE 1.28*  --  1.09* 0.92  --    < > = values in this interval not displayed.    Estimated Creatinine Clearance: 44.8 mL/min (by C-G formula based on SCr of 0.92 mg/dL).   Medical History: Past Medical History:  Diagnosis Date  . Allergy   . Anemia    pernicious anemia  . Asthma   . Blood transfusion without reported diagnosis   . CKD (chronic kidney disease)   . COPD (chronic obstructive pulmonary disease) ([place]) 12/23/2019  . Gastritis   . Glaucoma   . History of blood in urine   . Hyperlipidemia   . Hypertension   . Ocular hypertension   . Osteoporosis   . Type 2 diabetes mellitus ([place]) 12/23/2019  . Vitamin B12 deficiency anemia due to intrinsic factor deficiency     Medications:  Medications Prior to Admission  Medication Sig Dispense Refill Last Dose  . acetaminophen (TYLENOL) 325 MG tablet Take 2 tablets (650 mg total) by mouth every 6 (six) hours as needed for mild pain (or Fever >/= 101). 12 tablet 0 01/22/2020  . albuterol  (VENTOLIN HFA) 108 (90 Base) MCG/ACT inhaler Inhale 2 puffs into the lungs every 4 (four) hours as needed for wheezing or shortness of breath. 8 g 2   . amLODipine (NORVASC) 10 MG tablet Take 1 tablet (10 mg total) by mouth daily. 30 tablet 1 02/01/2020  . cyanocobalamin (,VITAMIN B-12,) 1000 MCG/ML injection Inject 1 mL (1,000 mcg total) into the muscle every 30 (thirty) days. 30 mL 0 01/27/2020  . dorzolamide (TRUSOPT) 2 % ophthalmic solution Place 1 drop into both eyes 2 (two) times daily.   02/01/2020  . feeding supplement, ENSURE ENLIVE, (ENSURE ENLIVE) LIQD Take 237 mLs by mouth 2 (two) times daily between meals.   5/32/9924  . folic acid (FOLVITE) 1 MG tablet Take 1 mg by mouth daily.    02/01/2020  . gabapentin (NEURONTIN) 100 MG capsule Take 100 mg by mouth at bedtime.   01/31/2020  . latanoprost (XALATAN) 0.005 % ophthalmic solution Place 1 drop into both eyes at bedtime.   01/31/2020  . megestrol (MEGACE) 400 MG/10ML suspension Take 10 mLs (400 mg total) by mouth daily. 480 mL 0 01/31/2020  . metoCLOPramide (REGLAN) 5 MG tablet Take 1 tablet (5 mg total) by mouth 3 (three) times daily before meals. 90 tablet 1 02/01/2020  . omeprazole (PRILOSEC) 40 MG  capsule Take 40 mg by mouth daily.    02/01/2020  . ondansetron (ZOFRAN ODT) 4 MG disintegrating tablet Take 1 tablet (4 mg total) by mouth every 8 (eight) hours as needed for nausea or vomiting. Dissolve under tongue 60 tablet 2   . ALPRAZolam (XANAX) 0.5 MG tablet for sedation before MRI scan; take 1 tab 1 hour before scan; may repeat 1 tab 15 min before scan (Patient not taking: Reported on 02/03/2020) 3 tablet 0 Not Taking at Unknown time   Scheduled:  . sodium chloride   Intravenous Once  . amLODipine  10 mg Oral Daily  . Chlorhexidine Gluconate Cloth  6 each Topical Daily  . cyanocobalamin  1,000 mcg Intramuscular Q30 days  . dorzolamide  1 drop Both Eyes BID  . feeding supplement (ENSURE ENLIVE)  237 mL Oral BID BM  . folic acid  1 mg Oral  Daily  . gabapentin  100 mg Oral QHS  . latanoprost  1 drop Both Eyes QHS  . metoCLOPramide  5 mg Oral TID AC  . nutrition supplement (JUVEN)  1 packet Oral BID BM  . pantoprazole  40 mg Oral Daily   Infusions:  . sodium chloride 50 mL/hr at 02/03/20 1424  . heparin 800 Units/hr (02/02/20 2318)   PRN: albuterol, ALPRAZolam Anti-infectives (From admission, onward)   None      Assessment: Traci Parker a 74 y.o. female requires anticoagulation with a heparin iv infusion for the indication of  DVT. Heparin gtt will be started following pharmacy protocol per pharmacy consult. Patient is not on previous oral anticoagulant that will require aPTT/HL correlation before transitioning to only HL monitoring.   HL 0.65- therapeutic  Goal of Therapy:  Heparin level 0.3-0.7 units/ml Monitor platelets by anticoagulation protocol: Yes   Plan:  Continue heparin infusion at 800 units/hr Check anti-Xa level  daily while on heparin Continue to monitor H&H and platelets   Margot Ables, PharmD Clinical Pharmacist 02/03/2020 2:57 PM

## 2020-02-03 NOTE — Progress Notes (Signed)
[place] for heparin gtt  Indication: DVT  Allergies  Allergen Reactions  . Latex Rash  . Levofloxacin Nausea And Vomiting  . Neomycin-Bacitracin-Polymyxin  [Bacitracin-Neomycin-Polymyxin] Rash  . Other Rash and Swelling  . Prednisone Hives, Itching, Rash and Other (See Comments)  . Fish Allergy     rash  . Neosporin [Neomycin-Polymyxin-Gramicidin] Itching and Rash    Patient Measurements: Height: 5\' 4"  (162.6 cm) Weight: 53.7 kg (118 lb 6.2 oz) IBW/kg (Calculated) : 54.7 Heparin Dosing Weight: HEPARIN DW (KG): 53.7   Vital Signs: Temp: 98.8 F (37.1 C) (07/24 0510) Temp Source: Oral (07/24 0510) BP: 100/60 (07/24 0510) Pulse Rate: 87 (07/24 0510)  Labs: Recent Labs    02/01/20 1724 02/01/20 1724 02/02/20 0423 02/03/20 0716  HGB 9.1*   < > 7.7* 9.5*  HCT 28.1*  --  23.5* 28.7*  PLT 271  --  231 236  HEPARINUNFRC  --   --   --  0.67  CREATININE 1.28*  --  1.09* 0.92   < > = values in this interval not displayed.    Estimated Creatinine Clearance: 44.8 mL/min (by C-G formula based on SCr of 0.92 mg/dL).   Medical History: Past Medical History:  Diagnosis Date  . Allergy   . Anemia    pernicious anemia  . Asthma   . Blood transfusion without reported diagnosis   . CKD (chronic kidney disease)   . COPD (chronic obstructive pulmonary disease) ([place]) 12/23/2019  . Gastritis   . Glaucoma   . History of blood in urine   . Hyperlipidemia   . Hypertension   . Ocular hypertension   . Osteoporosis   . Type 2 diabetes mellitus ([place]) 12/23/2019  . Vitamin B12 deficiency anemia due to intrinsic factor deficiency     Medications:  Medications Prior to Admission  Medication Sig Dispense Refill Last Dose  . acetaminophen (TYLENOL) 325 MG tablet Take 2 tablets (650 mg total) by mouth every 6 (six) hours as needed for mild pain (or Fever >/= 101). (Patient not taking: Reported on 01/24/2020) 12 tablet 0   . albuterol (VENTOLIN  HFA) 108 (90 Base) MCG/ACT inhaler Inhale 2 puffs into the lungs every 4 (four) hours as needed for wheezing or shortness of breath. (Patient not taking: Reported on 01/24/2020) 8 g 2   . ALPRAZolam (XANAX) 0.5 MG tablet for sedation before MRI scan; take 1 tab 1 hour before scan; may repeat 1 tab 15 min before scan 3 tablet 0   . amLODipine (NORVASC) 10 MG tablet Take 1 tablet (10 mg total) by mouth daily. 30 tablet 1   . cyanocobalamin (,VITAMIN B-12,) 1000 MCG/ML injection Inject 1 mL (1,000 mcg total) into the muscle every 30 (thirty) days. 30 mL 0   . dorzolamide (TRUSOPT) 2 % ophthalmic solution Place 1 drop into both eyes 2 (two) times daily.     . feeding supplement, ENSURE ENLIVE, (ENSURE ENLIVE) LIQD Take 237 mLs by mouth 2 (two) times daily between meals.     . folic acid (FOLVITE) 1 MG tablet Take 1 mg by mouth daily.      Marland Kitchen gabapentin (NEURONTIN) 100 MG capsule Take 100 mg by mouth at bedtime.     Marland Kitchen latanoprost (XALATAN) 0.005 % ophthalmic solution Place 1 drop into both eyes at bedtime.     . megestrol (MEGACE) 400 MG/10ML suspension Take 10 mLs (400 mg total) by mouth daily. 480 mL 0   . metoCLOPramide (  REGLAN) 5 MG tablet Take 1 tablet (5 mg total) by mouth 3 (three) times daily before meals. 90 tablet 1   . NON FORMULARY Diet Change: Dys 2 (ground), continue thin liquids; add extra gravy to meats     . omeprazole (PRILOSEC) 40 MG capsule Take 40 mg by mouth daily.      . ondansetron (ZOFRAN ODT) 4 MG disintegrating tablet Take 1 tablet (4 mg total) by mouth every 8 (eight) hours as needed for nausea or vomiting. Dissolve under tongue (Patient not taking: Reported on 01/24/2020) 60 tablet 2    Scheduled:  . sodium chloride   Intravenous Once  . amLODipine  10 mg Oral Daily  . Chlorhexidine Gluconate Cloth  6 each Topical Daily  . cyanocobalamin  1,000 mcg Intramuscular Q30 days  . dorzolamide  1 drop Both Eyes BID  . feeding supplement (ENSURE ENLIVE)  237 mL Oral BID BM  . folic  acid  1 mg Oral Daily  . gabapentin  100 mg Oral QHS  . latanoprost  1 drop Both Eyes QHS  . metoCLOPramide  5 mg Oral TID AC  . nutrition supplement (JUVEN)  1 packet Oral BID BM  . pantoprazole  40 mg Oral Daily   Infusions:  . sodium chloride 50 mL/hr at 02/01/20 2132  . heparin 800 Units/hr (02/02/20 2318)   PRN: albuterol, ALPRAZolam Anti-infectives (From admission, onward)   None      Assessment: Traci Parker a 74 y.o. female requires anticoagulation with a heparin iv infusion for the indication of  DVT. Heparin gtt will be started following pharmacy protocol per pharmacy consult. Patient is not on previous oral anticoagulant that will require aPTT/HL correlation before transitioning to only HL monitoring.   HL 0.67- therapeutic  Goal of Therapy:  Heparin level 0.3-0.7 units/ml Monitor platelets by anticoagulation protocol: Yes   Plan:  Continue heparin infusion at 800 units/hr Check anti-Xa level in 8 hours and daily while on heparin Continue to monitor H&H and platelets   Margot Ables, PharmD Clinical Pharmacist 02/03/2020 8:54 AM

## 2020-02-04 DIAGNOSIS — N183 Chronic kidney disease, stage 3 unspecified: Secondary | ICD-10-CM | POA: Diagnosis not present

## 2020-02-04 DIAGNOSIS — J449 Chronic obstructive pulmonary disease, unspecified: Secondary | ICD-10-CM | POA: Diagnosis not present

## 2020-02-04 DIAGNOSIS — E871 Hypo-osmolality and hyponatremia: Secondary | ICD-10-CM | POA: Diagnosis not present

## 2020-02-04 DIAGNOSIS — I824Y2 Acute embolism and thrombosis of unspecified deep veins of left proximal lower extremity: Secondary | ICD-10-CM | POA: Diagnosis not present

## 2020-02-04 LAB — URINALYSIS, ROUTINE W REFLEX MICROSCOPIC
Bilirubin Urine: NEGATIVE
Glucose, UA: NEGATIVE mg/dL
Ketones, ur: NEGATIVE mg/dL
Nitrite: NEGATIVE
Protein, ur: NEGATIVE mg/dL
Specific Gravity, Urine: 1.008 (ref 1.005–1.030)
pH: 7 (ref 5.0–8.0)

## 2020-02-04 LAB — CBC
HCT: 28.7 % — ABNORMAL LOW (ref 36.0–46.0)
Hemoglobin: 9.2 g/dL — ABNORMAL LOW (ref 12.0–15.0)
MCH: 25.1 pg — ABNORMAL LOW (ref 26.0–34.0)
MCHC: 32.1 g/dL (ref 30.0–36.0)
MCV: 78.2 fL — ABNORMAL LOW (ref 80.0–100.0)
Platelets: 273 10*3/uL (ref 150–400)
RBC: 3.67 MIL/uL — ABNORMAL LOW (ref 3.87–5.11)
WBC: 4.7 10*3/uL (ref 4.0–10.5)
nRBC: 0 % (ref 0.0–0.2)

## 2020-02-04 LAB — HEPARIN LEVEL (UNFRACTIONATED): Heparin Unfractionated: 0.64 IU/mL (ref 0.30–0.70)

## 2020-02-04 NOTE — Progress Notes (Signed)
[place] for heparin gtt  Indication: DVT  Allergies  Allergen Reactions  . Latex Rash  . Levofloxacin Nausea And Vomiting  . Neomycin-Bacitracin-Polymyxin  [Bacitracin-Neomycin-Polymyxin] Rash  . Other Rash and Swelling  . Prednisone Hives, Itching, Rash and Other (See Comments)  . Fish Allergy     rash  . Neosporin [Neomycin-Polymyxin-Gramicidin] Itching and Rash    Patient Measurements: Height: 5\' 4"  (162.6 cm) Weight: 53.7 kg (118 lb 6.2 oz) IBW/kg (Calculated) : 54.7 Heparin Dosing Weight: HEPARIN DW (KG): 53.7   Vital Signs: Temp: 100.5 F (38.1 C) (07/25 0509) Temp Source: Oral (07/25 0509) BP: 112/66 (07/25 0509) Pulse Rate: 92 (07/25 0509)  Labs: Recent Labs    02/01/20 1724 02/01/20 1724 02/02/20 0423 02/03/20 0716 02/03/20 1421 02/04/20 0624  HGB 9.1*   < > 7.7* 9.5*  --   --   HCT 28.1*  --  23.5* 28.7*  --   --   PLT 271  --  231 236  --   --   HEPARINUNFRC  --   --   --  0.67 0.65 0.64  CREATININE 1.28*  --  1.09* 0.92  --   --    < > = values in this interval not displayed.    Estimated Creatinine Clearance: 44.8 mL/min (by C-G formula based on SCr of 0.92 mg/dL).   Medical History: Past Medical History:  Diagnosis Date  . Allergy   . Anemia    pernicious anemia  . Asthma   . Blood transfusion without reported diagnosis   . CKD (chronic kidney disease)   . COPD (chronic obstructive pulmonary disease) ([place]) 12/23/2019  . Gastritis   . Glaucoma   . History of blood in urine   . Hyperlipidemia   . Hypertension   . Ocular hypertension   . Osteoporosis   . Type 2 diabetes mellitus ([place]) 12/23/2019  . Vitamin B12 deficiency anemia due to intrinsic factor deficiency     Medications:  Medications Prior to Admission  Medication Sig Dispense Refill Last Dose  . acetaminophen (TYLENOL) 325 MG tablet Take 2 tablets (650 mg total) by mouth every 6 (six) hours as needed for mild pain (or Fever >/= 101).  12 tablet 0 01/22/2020  . albuterol (VENTOLIN HFA) 108 (90 Base) MCG/ACT inhaler Inhale 2 puffs into the lungs every 4 (four) hours as needed for wheezing or shortness of breath. 8 g 2   . amLODipine (NORVASC) 10 MG tablet Take 1 tablet (10 mg total) by mouth daily. 30 tablet 1 02/01/2020  . cyanocobalamin (,VITAMIN B-12,) 1000 MCG/ML injection Inject 1 mL (1,000 mcg total) into the muscle every 30 (thirty) days. 30 mL 0 01/27/2020  . dorzolamide (TRUSOPT) 2 % ophthalmic solution Place 1 drop into both eyes 2 (two) times daily.   02/01/2020  . feeding supplement, ENSURE ENLIVE, (ENSURE ENLIVE) LIQD Take 237 mLs by mouth 2 (two) times daily between meals.   0/26/3785  . folic acid (FOLVITE) 1 MG tablet Take 1 mg by mouth daily.    02/01/2020  . gabapentin (NEURONTIN) 100 MG capsule Take 100 mg by mouth at bedtime.   01/31/2020  . latanoprost (XALATAN) 0.005 % ophthalmic solution Place 1 drop into both eyes at bedtime.   01/31/2020  . megestrol (MEGACE) 400 MG/10ML suspension Take 10 mLs (400 mg total) by mouth daily. 480 mL 0 01/31/2020  . metoCLOPramide (REGLAN) 5 MG tablet Take 1 tablet (5 mg total) by mouth 3 (  three) times daily before meals. 90 tablet 1 02/01/2020  . omeprazole (PRILOSEC) 40 MG capsule Take 40 mg by mouth daily.    02/01/2020  . ondansetron (ZOFRAN ODT) 4 MG disintegrating tablet Take 1 tablet (4 mg total) by mouth every 8 (eight) hours as needed for nausea or vomiting. Dissolve under tongue 60 tablet 2   . ALPRAZolam (XANAX) 0.5 MG tablet for sedation before MRI scan; take 1 tab 1 hour before scan; may repeat 1 tab 15 min before scan (Patient not taking: Reported on 02/03/2020) 3 tablet 0 Not Taking at Unknown time   Scheduled:  . sodium chloride   Intravenous Once  . amLODipine  10 mg Oral Daily  . Chlorhexidine Gluconate Cloth  6 each Topical Daily  . cyanocobalamin  1,000 mcg Intramuscular Q30 days  . dorzolamide  1 drop Both Eyes BID  . feeding supplement (ENSURE ENLIVE)  237 mL  Oral BID BM  . folic acid  1 mg Oral Daily  . gabapentin  100 mg Oral QHS  . latanoprost  1 drop Both Eyes QHS  . metoCLOPramide  5 mg Oral TID AC  . nutrition supplement (JUVEN)  1 packet Oral BID BM  . pantoprazole  40 mg Oral Daily   Infusions:  . sodium chloride 50 mL/hr at 02/04/20 0500  . heparin 800 Units/hr (02/04/20 0500)   PRN: albuterol, ALPRAZolam Anti-infectives (From admission, onward)   None      Assessment: Traci Parker a 74 y.o. female requires anticoagulation with a heparin iv infusion for the indication of  DVT. Heparin gtt will be started following pharmacy protocol per pharmacy consult. Patient is not on previous oral anticoagulant that will require aPTT/HL correlation before transitioning to only HL monitoring.   HL 0.64- therapeutic  Goal of Therapy:  Heparin level 0.3-0.7 units/ml Monitor platelets by anticoagulation protocol: Yes   Plan:  Continue heparin infusion at 800 units/hr Check anti-Xa level  daily while on heparin Continue to monitor H&H and platelets   Margot Ables, PharmD Clinical Pharmacist 02/04/2020 8:02 AM

## 2020-02-04 NOTE — Progress Notes (Signed)
PROGRESS NOTE    Traci Parker  OIN:[id number] DOB: 1946/06/06 DOA: 02/01/2020 PCP: Doree Albee, MD    Brief Narrative:  74 year old female with a history of anemia, hypertension who was recently admitted to the skilled nursing facility for generalized weakness, ataxia and difficulty with ambulation.  She was sent to the emergency room after she was found to be hyponatremic on labs.  She was admitted for IV fluids.  Further work-up indicated that she has left lower extremity DVT.  She is been started on anticoagulation.   Assessment & Plan:   Active Problems:   Hyperlipidemia   Hypertension   Failure to thrive in adult   COPD (chronic obstructive pulmonary disease) (HCC)   CKD (chronic kidney disease)   Anemia due to chronic renal failure treated with erythropoietin, stage 3 (moderate)   Hyponatremia   Malnutrition of moderate degree   Pressure injury of skin   Acute deep vein thrombosis (DVT) of left lower extremity ([place])   1. Hyponatremia.  Likely related to hypovolemia from poor p.o. intake.  Started on intravenous saline with improvement of sodium.  Her p.o. intake has been poor since she is unable to eat due to lack of teeth.  Will change diet to pured diet. 2. Anemia.  Patient has anemia of chronic disease as well as B12 deficiency.  She has been receiving ESA as an outpatient.  Hemoglobin on admission trended down to 7.7 with IV hydration.  She received 1 unit PRBC with improvement of hemoglobin to 9.5.  Continue to monitor. 3. Left lower extremity DVT.  Patient was nonambulatory and taking Megace as an outpatient.  She was found to have extensive DVT in the left lower extremity.  Started on intravenous heparin.  Discontinue Megace.  Case reviewed with Dr. Carlis Abbott on-call for vascular surgery.  Due to her comorbidities, poor functional status, and lack of significant symptoms, recommendations were for continued anticoagulation.  Does not appear to be a candidate for  thrombectomy at this time. 4. Failure to thrive/generalized weakness.  She was at nursing facility for ataxia and difficulty with ambulation.  She has been followed by neurology.  After discussing with her daughter, patient has not been cleared by her neurologist to undergo physical therapy and therefore has not been receiving therapy at the nursing facility.  MRI of her C-spine T-spine was ordered by the neurologist.  Will need to follow-up with neurology for clarification of her activity status while undergoing neurologic work-up. 5. COPD.  No shortness of breath or wheezing at this time. 6. Hypertension.  Stable on Norvasc. 7. Malnutrition of moderate degree.  Nutrition following.  Pressure injury: Pressure Injury 02/01/20 Sacrum Stage 2 -  Partial thickness loss of dermis presenting as a shallow open injury with a red, pink wound bed without slough. (Active)  02/01/20 2033  Location: Sacrum  Location Orientation:   Staging: Stage 2 -  Partial thickness loss of dermis presenting as a shallow open injury with a red, pink wound bed without slough.  Wound Description (Comments):   Present on Admission: Yes  -Continue supportive measures       DVT prophylaxis: Heparin infusion Code Status: Full code Family Communication: Left voicemail for daughter Disposition Plan: Status is: Inpatient  Remains inpatient appropriate because:Inpatient level of care appropriate due to severity of illness   Dispo: The patient is from: SNF              Anticipated d/c is to: SNF  Anticipated d/c date is: 3 days              Patient currently is not medically stable to d/c.    Consultants:     Procedures:     Antimicrobials:       Subjective: No shortness of breath.  No chest pain.  No nausea or vomiting.  Still feels generally weak.  Objective: Vitals:   02/03/20 2031 02/03/20 2119 02/04/20 0509 02/04/20 1400  BP:  (!) 116/64 112/66 (!) 119/62  Pulse:  79 92 81  Resp:   18 18 19   Temp:  98.5 F (36.9 C) (!) 100.5 F (38.1 C) 98 F (36.7 C)  TempSrc:  Oral Oral Oral  SpO2: 96% 98% 98% 99%  Weight:      Height:        Intake/Output Summary (Last 24 hours) at 02/04/2020 1938 Last data filed at 02/04/2020 1700 Gross per 24 hour  Intake 2489.25 ml  Output 1400 ml  Net 1089.25 ml   Filed Weights   02/01/20 1644 02/01/20 2135  Weight: 50.3 kg 53.7 kg    Examination:  General exam: Alert, awake, oriented x 3 Respiratory system: Clear to auscultation. Respiratory effort normal. Cardiovascular system:RRR. No murmurs, rubs, gallops. Gastrointestinal system: Abdomen is nondistended, soft and nontender. No organomegaly or masses felt. Normal bowel sounds heard. Central nervous system: Alert and oriented. No focal neurological deficits. Extremities: 1+ edema left lower extremity which is warm to touch Skin: No rashes, lesions or ulcers Psychiatry: Judgement and insight appear normal. Mood & affect appropriate.     Data Reviewed: I have personally reviewed following labs and imaging studies  CBC: Recent Labs  Lab 02/01/20 1724 02/02/20 0423 02/03/20 0716 02/04/20 0624  WBC 10.6* 7.4 4.8 4.7  NEUTROABS 8.3*  --   --   --   HGB 9.1* 7.7* 9.5* 9.2*  HCT 28.1* 23.5* 28.7* 28.7*  MCV 74.1* 74.6* 76.5* 78.2*  PLT 271 231 236 008   Basic Metabolic Panel: Recent Labs  Lab 02/01/20 0700 02/01/20 1724 02/02/20 0423 02/03/20 0716  NA 120* 120* 124* 128*  K 4.3 4.4 3.9 3.5  CL 88* 89* 94* 97*  CO2 22 21* 21* 21*  GLUCOSE 109* 140* 101* 97  BUN 23 27* 22 16  CREATININE 1.29* 1.28* 1.09* 0.92  CALCIUM 8.6* 8.9 8.5* 8.5*  MG  --  1.9  --   --    GFR: Estimated Creatinine Clearance: 44.8 mL/min (by C-G formula based on SCr of 0.92 mg/dL). Liver Function Tests: Recent Labs  Lab 02/01/20 1724  AST 12*  ALT 11  ALKPHOS 72  BILITOT 0.5  PROT 6.8  ALBUMIN 3.4*   No results for input(s): LIPASE, AMYLASE in the last 168 hours. No results  for input(s): AMMONIA in the last 168 hours. Coagulation Profile: No results for input(s): INR, PROTIME in the last 168 hours. Cardiac Enzymes: No results for input(s): CKTOTAL, CKMB, CKMBINDEX, TROPONINI in the last 168 hours. BNP (last 3 results) No results for input(s): PROBNP in the last 8760 hours. HbA1C: No results for input(s): HGBA1C in the last 72 hours. CBG: No results for input(s): GLUCAP in the last 168 hours. Lipid Profile: No results for input(s): CHOL, HDL, LDLCALC, TRIG, CHOLHDL, LDLDIRECT in the last 72 hours. Thyroid Function Tests: No results for input(s): TSH, T4TOTAL, FREET4, T3FREE, THYROIDAB in the last 72 hours. Anemia Panel: No results for input(s): VITAMINB12, FOLATE, FERRITIN, TIBC, IRON, RETICCTPCT in the last 72 hours.  Sepsis Labs: No results for input(s): PROCALCITON, LATICACIDVEN in the last 168 hours.  Recent Results (from the past 240 hour(s))  SARS Coronavirus 2 by RT PCR (hospital order, performed in [facility] hospital lab) Nasopharyngeal Nasopharyngeal Swab     Status: None   Collection Time: 02/01/20  6:42 PM   Specimen: Nasopharyngeal Swab  Result Value Ref Range Status   SARS Coronavirus 2 NEGATIVE NEGATIVE Final    Comment: (NOTE) SARS-CoV-2 target nucleic acids are NOT DETECTED.  The SARS-CoV-2 RNA is generally detectable in upper and lower respiratory specimens during the acute phase of infection. The lowest concentration of SARS-CoV-2 viral copies this assay can detect is 250 copies / mL. A negative result does not preclude SARS-CoV-2 infection and should not be used as the sole basis for treatment or other patient management decisions.  A negative result may occur with improper specimen collection / handling, submission of specimen other than nasopharyngeal swab, presence of viral mutation(s) within the areas targeted by this assay, and inadequate number of viral copies (<250 copies / mL). A negative result must be combined with  clinical observations, patient history, and epidemiological information.  Fact Sheet for Patients:   StrictlyIdeas.no  Fact Sheet for Healthcare Providers: BankingDealers.co.za  This test is not yet approved or  cleared by the [place] FDA and has been authorized for detection and/or diagnosis of SARS-CoV-2 by FDA under an Emergency Use Authorization (EUA).  This EUA will remain in effect (meaning this test can be used) for the duration of the COVID-19 declaration under Section 564(b)(1) of the Act, 21 U.S.C. section 360bbb-3(b)(1), unless the authorization is terminated or revoked sooner.  Performed at [facility], [address] [place], [place] [address]          Radiology Studies: No results found.      Scheduled Meds: . sodium chloride   Intravenous Once  . amLODipine  10 mg Oral Daily  . Chlorhexidine Gluconate Cloth  6 each Topical Daily  . cyanocobalamin  1,000 mcg Intramuscular Q30 days  . dorzolamide  1 drop Both Eyes BID  . feeding supplement (ENSURE ENLIVE)  237 mL Oral BID BM  . folic acid  1 mg Oral Daily  . gabapentin  100 mg Oral QHS  . latanoprost  1 drop Both Eyes QHS  . metoCLOPramide  5 mg Oral TID AC  . nutrition supplement (JUVEN)  1 packet Oral BID BM  . pantoprazole  40 mg Oral Daily   Continuous Infusions: . sodium chloride 50 mL/hr at 02/04/20 0909  . heparin 800 Units/hr (02/04/20 0500)     LOS: 3 days    Time spent: 58mins    Kathie Dike, MD [facility]   If 7PM-7AM, please contact night-coverage [email or web address]  02/04/2020, 7:38 PM

## 2020-02-05 ENCOUNTER — Inpatient Hospital Stay (HOSPITAL_COMMUNITY): Payer: Medicare Other

## 2020-02-05 DIAGNOSIS — N183 Chronic kidney disease, stage 3 unspecified: Secondary | ICD-10-CM | POA: Diagnosis not present

## 2020-02-05 DIAGNOSIS — I824Y2 Acute embolism and thrombosis of unspecified deep veins of left proximal lower extremity: Secondary | ICD-10-CM | POA: Diagnosis not present

## 2020-02-05 DIAGNOSIS — E871 Hypo-osmolality and hyponatremia: Secondary | ICD-10-CM | POA: Diagnosis not present

## 2020-02-05 DIAGNOSIS — J449 Chronic obstructive pulmonary disease, unspecified: Secondary | ICD-10-CM | POA: Diagnosis not present

## 2020-02-05 LAB — CBC
HCT: 29.2 % — ABNORMAL LOW (ref 36.0–46.0)
Hemoglobin: 9.4 g/dL — ABNORMAL LOW (ref 12.0–15.0)
MCH: 25.2 pg — ABNORMAL LOW (ref 26.0–34.0)
MCHC: 32.2 g/dL (ref 30.0–36.0)
MCV: 78.3 fL — ABNORMAL LOW (ref 80.0–100.0)
Platelets: 270 10*3/uL (ref 150–400)
RBC: 3.73 MIL/uL — ABNORMAL LOW (ref 3.87–5.11)
WBC: 5 10*3/uL (ref 4.0–10.5)
nRBC: 0 % (ref 0.0–0.2)

## 2020-02-05 LAB — COMPREHENSIVE METABOLIC PANEL
ALT: 10 U/L (ref 0–44)
AST: 9 U/L — ABNORMAL LOW (ref 15–41)
Albumin: 2.6 g/dL — ABNORMAL LOW (ref 3.5–5.0)
Alkaline Phosphatase: 48 U/L (ref 38–126)
Anion gap: 9 (ref 5–15)
BUN: 19 mg/dL (ref 8–23)
CO2: 19 mmol/L — ABNORMAL LOW (ref 22–32)
Calcium: 8.6 mg/dL — ABNORMAL LOW (ref 8.9–10.3)
Chloride: 104 mmol/L (ref 98–111)
Creatinine, Ser: 0.9 mg/dL (ref 0.44–1.00)
GFR calc Af Amer: >60 mL/min (ref >60)
GFR calc non Af Amer: >60 mL/min (ref >60)
Glucose, Bld: 95 mg/dL (ref 70–99)
Potassium: 3.5 mmol/L (ref 3.5–5.1)
Sodium: 132 mmol/L — ABNORMAL LOW (ref 135–145)
Total Bilirubin: 0.5 mg/dL (ref 0.3–1.2)
Total Protein: 5.2 g/dL — ABNORMAL LOW (ref 6.5–8.1)

## 2020-02-05 LAB — HEPARIN LEVEL (UNFRACTIONATED): Heparin Unfractionated: 0.64 IU/mL (ref 0.30–0.70)

## 2020-02-05 MED ORDER — APIXABAN 5 MG PO TABS
10.0000 mg | ORAL_TABLET | Freq: Two times a day (BID) | ORAL | Status: DC
  Administered 2020-02-05 – 2020-02-09 (×8): 10 mg via ORAL
  Filled 2020-02-05 (×9): qty 2

## 2020-02-05 MED ORDER — APIXABAN 5 MG PO TABS: 10.0000 mg | ORAL_TABLET | Freq: Two times a day (BID) | ORAL | Status: DC

## 2020-02-05 MED ORDER — GADOBUTROL 1 MMOL/ML IV SOLN
5.0000 mL | Freq: Once | INTRAVENOUS | Status: AC | PRN
  Administered 2020-02-05: 5 mL via INTRAVENOUS

## 2020-02-05 MED ORDER — APIXABAN 5 MG PO TABS: 5.0000 mg | ORAL_TABLET | Freq: Two times a day (BID) | ORAL | Status: DC

## 2020-02-05 NOTE — Progress Notes (Signed)
PROGRESS NOTE    Traci Parker  PTW:[id number] DOB: [birth year] DOA: 02/01/2020 PCP: Doree Albee, MD    Brief Narrative:  74 year old female with a history of anemia, hypertension who was recently admitted to the skilled nursing facility for generalized weakness, ataxia and difficulty with ambulation.  She was sent to the emergency room after she was found to be hyponatremic on labs.  She was admitted for IV fluids.  Further work-up indicated that she has left lower extremity DVT.  She is been started on anticoagulation.   Assessment & Plan:   Active Problems:   Hyperlipidemia   Hypertension   Failure to thrive in adult   COPD (chronic obstructive pulmonary disease) (HCC)   CKD (chronic kidney disease)   Anemia due to chronic renal failure treated with erythropoietin, stage 3 (moderate)   Hyponatremia   Malnutrition of moderate degree   Pressure injury of skin   Acute deep vein thrombosis (DVT) of left lower extremity ([place])   1. Hyponatremia.  Likely related to hypovolemia from poor p.o. intake.  Started on intravenous saline with improvement of sodium.  Her p.o. intake has been poor since she is unable to eat due to lack of teeth.  Diet changed to pured diet. 2. Anemia.  Patient has anemia of chronic disease as well as B12 deficiency.  She has been receiving ESA as an outpatient.  Hemoglobin on admission trended down to 7.7 with IV hydration.  She received 1 unit PRBC with improvement of hemoglobin to 9.5.  Continue to monitor. 3. Left lower extremity DVT.  Patient was nonambulatory and taking Megace as an outpatient.  She was found to have extensive DVT in the left lower extremity.  Started on intravenous heparin.  Discontinue Megace.  Case reviewed with Dr. Carlis Abbott on-call for vascular surgery.  Due to her comorbidities, poor functional status, and lack of significant symptoms, recommendations were for continued anticoagulation.  Does not appear to be a candidate for thrombectomy  at this time.  She has been transitioned to Eliquis.  With concurrent hyponatremia, failure to thrive and extensive DVT, will check CT chest, abdomen pelvis to rule out underlying malignancy. 4. Failure to thrive/generalized weakness.  She was at nursing facility for ataxia and difficulty with ambulation.  She has been followed by neurology.  I discussed her care with her primary neurologist, Dr. Leta Baptist who requested that MRI C-spine and MRI T-spine be obtained in her hospitalization due to significant barriers in obtaining this as an outpatient.  MRI T-spine performed was read as a normal study.  MRI C-spine shows degenerative changes with cervical spinal stenosis with mass-effect on spinal cord.  Case was reviewed with Dr. Newman Pies on-call for neurosurgery.  Due to the chronic nature of her symptoms, age and comorbidities, she would be a high risk surgical candidate.  It was not felt that surgical intervention was urgently needed at this time.  Recommendations were to follow-up with neurosurgery as an outpatient. 5. COPD.  No shortness of breath or wheezing at this time. 6. Hypertension.  Stable on Norvasc. 7. Malnutrition of moderate degree.  Nutrition following.  Pressure injury: Pressure Injury 02/01/20 Sacrum Stage 2 -  Partial thickness loss of dermis presenting as a shallow open injury with a red, pink wound bed without slough. (Active)  02/01/20 2033  Location: Sacrum  Location Orientation:   Staging: Stage 2 -  Partial thickness loss of dermis presenting as a shallow open injury with a red, pink wound bed without  slough.  Wound Description (Comments):   Present on Admission: Yes  -Continue supportive measures       DVT prophylaxis: Eliquis Code Status: Full code Family Communication: Discussed with her daughter over the phone 7/26 Disposition Plan: Status is: Inpatient  Remains inpatient appropriate because:Inpatient level of care appropriate due to severity of  illness   Dispo: The patient is from: SNF              Anticipated d/c is to: SNF              Anticipated d/c date is: 3 days              Patient currently is not medically stable to d/c.    Consultants:     Procedures:     Antimicrobials:       Subjective: No nausea or vomiting.  Continues to have general weakness.  No shortness of breath.  Objective: Vitals:   02/04/20 2124 02/05/20 0521 02/05/20 1443 02/05/20 2118  BP: 111/70 (!) 112/63 107/75 105/69  Pulse: 84 90 93 97  Resp: 19 19 16 16   Temp: 98.3 F (36.8 C) 98.8 F (37.1 C) 97.9 F (36.6 C) 98.1 F (36.7 C)  TempSrc: Oral Oral Oral Oral  SpO2: 97% 99% 100% 96%  Weight:      Height:        Intake/Output Summary (Last 24 hours) at 02/05/2020 2124 Last data filed at 02/05/2020 1700 Gross per 24 hour  Intake 1444.33 ml  Output 1300 ml  Net 144.33 ml   Filed Weights   02/01/20 1644 02/01/20 2135  Weight: 50.3 kg 53.7 kg    Examination:  General exam: Alert, awake, oriented x 3 Respiratory system: Clear to auscultation. Respiratory effort normal. Cardiovascular system:RRR. No murmurs, rubs, gallops. Gastrointestinal system: Abdomen is nondistended, soft and nontender. No organomegaly or masses felt. Normal bowel sounds heard. Central nervous system: Alert and oriented. No focal neurological deficits. Extremities: 1+ edema in LLE Skin: No rashes, lesions or ulcers Psychiatry: Judgement and insight appear normal. Mood & affect appropriate.      Data Reviewed: I have personally reviewed following labs and imaging studies  CBC: Recent Labs  Lab 02/01/20 1724 02/02/20 0423 02/03/20 0716 02/04/20 0624 02/05/20 0512  WBC 10.6* 7.4 4.8 4.7 5.0  NEUTROABS 8.3*  --   --   --   --   HGB 9.1* 7.7* 9.5* 9.2* 9.4*  HCT 28.1* 23.5* 28.7* 28.7* 29.2*  MCV 74.1* 74.6* 76.5* 78.2* 78.3*  PLT 271 231 236 273 937   Basic Metabolic Panel: Recent Labs  Lab 02/01/20 0700 02/01/20 1724 02/02/20 0423  02/03/20 0716 02/05/20 0512  NA 120* 120* 124* 128* 132*  K 4.3 4.4 3.9 3.5 3.5  CL 88* 89* 94* 97* 104  CO2 22 21* 21* 21* 19*  GLUCOSE 109* 140* 101* 97 95  BUN 23 27* 22 16 19   CREATININE 1.29* 1.28* 1.09* 0.92 0.90  CALCIUM 8.6* 8.9 8.5* 8.5* 8.6*  MG  --  1.9  --   --   --    GFR: Estimated Creatinine Clearance: 45.8 mL/min (by C-G formula based on SCr of 0.9 mg/dL). Liver Function Tests: Recent Labs  Lab 02/01/20 1724 02/05/20 0512  AST 12* 9*  ALT 11 10  ALKPHOS 72 48  BILITOT 0.5 0.5  PROT 6.8 5.2*  ALBUMIN 3.4* 2.6*   No results for input(s): LIPASE, AMYLASE in the last 168 hours. No results for input(s): AMMONIA  in the last 168 hours. Coagulation Profile: No results for input(s): INR, PROTIME in the last 168 hours. Cardiac Enzymes: No results for input(s): CKTOTAL, CKMB, CKMBINDEX, TROPONINI in the last 168 hours. BNP (last 3 results) No results for input(s): PROBNP in the last 8760 hours. HbA1C: No results for input(s): HGBA1C in the last 72 hours. CBG: No results for input(s): GLUCAP in the last 168 hours. Lipid Profile: No results for input(s): CHOL, HDL, LDLCALC, TRIG, CHOLHDL, LDLDIRECT in the last 72 hours. Thyroid Function Tests: No results for input(s): TSH, T4TOTAL, FREET4, T3FREE, THYROIDAB in the last 72 hours. Anemia Panel: No results for input(s): VITAMINB12, FOLATE, FERRITIN, TIBC, IRON, RETICCTPCT in the last 72 hours. Sepsis Labs: No results for input(s): PROCALCITON, LATICACIDVEN in the last 168 hours.  Recent Results (from the past 240 hour(s))  SARS Coronavirus 2 by RT PCR (hospital order, performed in [facility] hospital lab) Nasopharyngeal Nasopharyngeal Swab     Status: None   Collection Time: 02/01/20  6:42 PM   Specimen: Nasopharyngeal Swab  Result Value Ref Range Status   SARS Coronavirus 2 NEGATIVE NEGATIVE Final    Comment: (NOTE) SARS-CoV-2 target nucleic acids are NOT DETECTED.  The SARS-CoV-2 RNA is generally detectable  in upper and lower respiratory specimens during the acute phase of infection. The lowest concentration of SARS-CoV-2 viral copies this assay can detect is 250 copies / mL. A negative result does not preclude SARS-CoV-2 infection and should not be used as the sole basis for treatment or other patient management decisions.  A negative result may occur with improper specimen collection / handling, submission of specimen other than nasopharyngeal swab, presence of viral mutation(s) within the areas targeted by this assay, and inadequate number of viral copies (<250 copies / mL). A negative result must be combined with clinical observations, patient history, and epidemiological information.  Fact Sheet for Patients:   StrictlyIdeas.no  Fact Sheet for Healthcare Providers: BankingDealers.co.za  This test is not yet approved or  cleared by the [place] FDA and has been authorized for detection and/or diagnosis of SARS-CoV-2 by FDA under an Emergency Use Authorization (EUA).  This EUA will remain in effect (meaning this test can be used) for the duration of the COVID-19 declaration under Section 564(b)(1) of the Act, 21 U.S.C. section 360bbb-3(b)(1), unless the authorization is terminated or revoked sooner.  Performed at [facility], [address], [place], [place] [address]          Radiology Studies: MR CERVICAL SPINE W WO CONTRAST  Result Date: 02/05/2020 CLINICAL DATA:  74 year old female with pain for 2 months. No known injury. Ataxia. EXAM: MRI CERVICAL SPINE WITHOUT AND WITH CONTRAST TECHNIQUE: Multiplanar and multiecho pulse sequences of the cervical spine, to include the craniocervical junction and cervicothoracic junction, were obtained without and with intravenous contrast. CONTRAST:  62mL GADAVIST GADOBUTROL 1 MMOL/ML IV SOLN COMPARISON:  Brain MRI 01/05/2020. FINDINGS: Study is mildly degraded by motion artifact despite  repeated imaging attempts. Alignment: Straightening of cervical lordosis. No spondylolisthesis. Vertebrae: No marrow edema or evidence of acute osseous abnormality. Degenerative endplate marrow signal changes in the cervical spine. T1 vertebral body hemangioma. Cord: Multilevel degenerative cervical spinal cord mass effect, with appearance suspicious for some spinal cord myelomalacia at the C6 level greater on the left (series 19, image 20). No abnormal intradural enhancement. No dural thickening. Posterior Fossa, vertebral arteries, paraspinal tissues: Cervicomedullary junction, visible posterior fossa and brain parenchyma appear stable from last month. Preserved major vascular flow voids  in the neck. The left vertebral artery appears mildly dominant. Negative visible neck soft tissues, right lung apex. Disc levels: C2-C3:  Mild facet and ligament flavum hypertrophy.  No stenosis. C3-C4: Mild circumferential disc bulge and endplate spurring. Broad-based posterior component with mild spinal stenosis and cord mass effect. Mild to moderate bilateral C4 foraminal stenosis. C4-C5: Disc space loss with circumferential disc osteophyte complex eccentric to the right (series 19, image 12). Mild facet hypertrophy. Mild to moderate spinal stenosis and spinal cord mass effect greater on the right. Severe right greater than left C5 foraminal stenosis. C5-C6: Disc space loss. Circumferential disc osteophyte complex with broad-based posterior component. Mild to moderate spinal stenosis and spinal cord mass effect. Moderate to severe left greater than right C6 foraminal stenosis. C6-C7: Disc space loss. Circumferential disc osteophyte complex. Broad-based posterior component with mild spinal stenosis and cord mass effect. Relative sparing of the neural foramina. Mild left C7 foraminal stenosis. C7-T1: Mild endplate spurring, facet and ligament flavum hypertrophy. No spinal stenosis. Mild to moderate left C8 foraminal stenosis.  IMPRESSION: 1. Widespread cervical spine degeneration. No acute osseous abnormality. 2. Multilevel mild to moderate spinal stenosis and spinal cord mass effect at C3-C4 through C6-C7, with possible spinal cord myelomalacia at the C6 level. No other cord signal abnormality. 3. Moderate or severe degenerative neural foraminal stenosis at the bilateral C5 and C6 nerve levels. Electronically Signed   By: Genevie Ann M.D.   On: 02/05/2020 16:51   MR THORACIC SPINE W WO CONTRAST  Result Date: 02/05/2020 CLINICAL DATA:  74 year old female with pain for 2 months. No known injury. Ataxia. EXAM: MRI THORACIC WITHOUT AND WITH CONTRAST TECHNIQUE: Multiplanar and multiecho pulse sequences of the thoracic spine were obtained without and with intravenous contrast. CONTRAST:  29mL GADAVIST GADOBUTROL 1 MMOL/ML IV SOLN COMPARISON:  Cervical spine MRI today reported separately. FINDINGS: Axial images are mildly degraded by motion artifact despite repeated imaging attempts. Limited cervical spine imaging:  Reported separately today. Thoracic spine segmentation:  Appears to be normal. Alignment:  Normal thoracic kyphosis.  No spondylolisthesis. Vertebrae: Benign vertebral hemangioma in the T1 body. Normal background bone marrow signal. No marrow edema or evidence of acute osseous abnormality. Cord: Normal thoracic spinal cord signal and morphology. The conus medullaris appears normal at L1-L2. No abnormal intradural enhancement. No dural thickening. Paraspinal and other soft tissues: Negative. Disc levels: No age advanced thoracic spine degeneration. No significant disc herniation and no thoracic spinal stenosis. No thoracic foraminal stenosis. IMPRESSION: Normal for age MRI appearance of the thoracic spine. Electronically Signed   By: Genevie Ann M.D.   On: 02/05/2020 16:58        Scheduled Meds:  sodium chloride   Intravenous Once   amLODipine  10 mg Oral Daily   apixaban  10 mg Oral BID   Followed by   Derrill Memo ON 02/12/2020]  apixaban  5 mg Oral BID   Chlorhexidine Gluconate Cloth  6 each Topical Daily   cyanocobalamin  1,000 mcg Intramuscular Q30 days   dorzolamide  1 drop Both Eyes BID   feeding supplement (ENSURE ENLIVE)  237 mL Oral BID BM   folic acid  1 mg Oral Daily   gabapentin  100 mg Oral QHS   latanoprost  1 drop Both Eyes QHS   metoCLOPramide  5 mg Oral TID AC   nutrition supplement (JUVEN)  1 packet Oral BID BM   pantoprazole  40 mg Oral Daily   Continuous Infusions:  sodium chloride 50  mL/hr at 02/05/20 1741     LOS: 4 days    Time spent: 19mins    Kathie Dike, MD [facility]   If 7PM-7AM, please contact night-coverage [email or web address]  02/05/2020, 9:24 PM

## 2020-02-05 NOTE — Care Management Important Message (Signed)
Important Message  Patient Details  Name: Traci Parker MRN: [id number] Date of Birth: [birth year]   Medicare Important Message Given:  Yes     Tommy Medal 02/05/2020, 4:21 PM

## 2020-02-05 NOTE — Progress Notes (Signed)
[place] for heparin transition to eliquis Indication: DVT  Allergies  Allergen Reactions  . Latex Rash  . Levofloxacin Nausea And Vomiting  . Neomycin-Bacitracin-Polymyxin  [Bacitracin-Neomycin-Polymyxin] Rash  . Other Rash and Swelling  . Prednisone Hives, Itching, Rash and Other (See Comments)  . Fish Allergy     rash  . Neosporin [Neomycin-Polymyxin-Gramicidin] Itching and Rash    Patient Measurements: Height: 5\' 4"  (162.6 cm) Weight: 53.7 kg (118 lb 6.2 oz) IBW/kg (Calculated) : 54.7 HEPARIN DW (KG): 53.7   Vital Signs: Temp: 98.8 F (37.1 C) (07/26 0521) Temp Source: Oral (07/26 0521) BP: 112/63 (07/26 0521) Pulse Rate: 90 (07/26 0521)  Labs: Recent Labs    02/03/20 0716 02/03/20 0716 02/03/20 1421 02/04/20 0624 02/05/20 0512  HGB 9.5*   < >  --  9.2* 9.4*  HCT 28.7*  --   --  28.7* 29.2*  PLT 236  --   --  273 270  HEPARINUNFRC 0.67   < > 0.65 0.64 0.64  CREATININE 0.92  --   --   --  0.90   < > = values in this interval not displayed.    Estimated Creatinine Clearance: 45.8 mL/min (by C-G formula based on SCr of 0.9 mg/dL).   Medical History: Past Medical History:  Diagnosis Date  . Allergy   . Anemia    pernicious anemia  . Asthma   . Blood transfusion without reported diagnosis   . CKD (chronic kidney disease)   . COPD (chronic obstructive pulmonary disease) ([place]) 12/23/2019  . Gastritis   . Glaucoma   . History of blood in urine   . Hyperlipidemia   . Hypertension   . Ocular hypertension   . Osteoporosis   . Type 2 diabetes mellitus ([place]) 12/23/2019  . Vitamin B12 deficiency anemia due to intrinsic factor deficiency     Medications:  Medications Prior to Admission  Medication Sig Dispense Refill Last Dose  . acetaminophen (TYLENOL) 325 MG tablet Take 2 tablets (650 mg total) by mouth every 6 (six) hours as needed for mild pain (or Fever >/= 101). 12 tablet 0 01/22/2020  . albuterol (VENTOLIN HFA)  108 (90 Base) MCG/ACT inhaler Inhale 2 puffs into the lungs every 4 (four) hours as needed for wheezing or shortness of breath. 8 g 2   . amLODipine (NORVASC) 10 MG tablet Take 1 tablet (10 mg total) by mouth daily. 30 tablet 1 02/01/2020  . cyanocobalamin (,VITAMIN B-12,) 1000 MCG/ML injection Inject 1 mL (1,000 mcg total) into the muscle every 30 (thirty) days. 30 mL 0 01/27/2020  . dorzolamide (TRUSOPT) 2 % ophthalmic solution Place 1 drop into both eyes 2 (two) times daily.   02/01/2020  . feeding supplement, ENSURE ENLIVE, (ENSURE ENLIVE) LIQD Take 237 mLs by mouth 2 (two) times daily between meals.   5/80/9983  . folic acid (FOLVITE) 1 MG tablet Take 1 mg by mouth daily.    02/01/2020  . gabapentin (NEURONTIN) 100 MG capsule Take 100 mg by mouth at bedtime.   01/31/2020  . latanoprost (XALATAN) 0.005 % ophthalmic solution Place 1 drop into both eyes at bedtime.   01/31/2020  . megestrol (MEGACE) 400 MG/10ML suspension Take 10 mLs (400 mg total) by mouth daily. 480 mL 0 01/31/2020  . metoCLOPramide (REGLAN) 5 MG tablet Take 1 tablet (5 mg total) by mouth 3 (three) times daily before meals. 90 tablet 1 02/01/2020  . omeprazole (PRILOSEC) 40 MG capsule Take 40  mg by mouth daily.    02/01/2020  . ondansetron (ZOFRAN ODT) 4 MG disintegrating tablet Take 1 tablet (4 mg total) by mouth every 8 (eight) hours as needed for nausea or vomiting. Dissolve under tongue 60 tablet 2   . ALPRAZolam (XANAX) 0.5 MG tablet for sedation before MRI scan; take 1 tab 1 hour before scan; may repeat 1 tab 15 min before scan (Patient not taking: Reported on 02/03/2020) 3 tablet 0 Not Taking at Unknown time   Scheduled:  . sodium chloride   Intravenous Once  . amLODipine  10 mg Oral Daily  . apixaban  10 mg Oral BID   Followed by  . [START ON 02/12/2020] apixaban  5 mg Oral BID  . Chlorhexidine Gluconate Cloth  6 each Topical Daily  . cyanocobalamin  1,000 mcg Intramuscular Q30 days  . dorzolamide  1 drop Both Eyes BID  .  feeding supplement (ENSURE ENLIVE)  237 mL Oral BID BM  . folic acid  1 mg Oral Daily  . gabapentin  100 mg Oral QHS  . latanoprost  1 drop Both Eyes QHS  . metoCLOPramide  5 mg Oral TID AC  . nutrition supplement (JUVEN)  1 packet Oral BID BM  . pantoprazole  40 mg Oral Daily   Infusions:  . sodium chloride 50 mL/hr at 02/05/20 0534  . heparin 800 Units/hr (02/05/20 1030)   PRN: albuterol, ALPRAZolam Anti-infectives (From admission, onward)   None      Assessment: Traci Parker a 74 y.o. female requires anticoagulation with a heparin iv infusion for the indication of  DVT. Heparin gtt will be started following pharmacy protocol per pharmacy consult. Patient is not on previous oral anticoagulant that will require aPTT/HL correlation before transitioning to only HL monitoring.  Transition to DOAC  Goal of Therapy:  Heparin level 0.3-0.7 units/ml Monitor platelets by anticoagulation protocol: Yes   Plan:  D/C heparin  Eliquis 10mg  po bid x 7 days, then 5mg  po BID Educate on eliquis Continue to monitor H&H and platelets  Isac Sarna, BS Vena [place], BCPS Clinical Pharmacist Pager [phone] 02/05/2020 2:16 PM

## 2020-02-05 NOTE — Progress Notes (Signed)
[place] for heparin gtt  Indication: DVT  Allergies  Allergen Reactions  . Latex Rash  . Levofloxacin Nausea And Vomiting  . Neomycin-Bacitracin-Polymyxin  [Bacitracin-Neomycin-Polymyxin] Rash  . Other Rash and Swelling  . Prednisone Hives, Itching, Rash and Other (See Comments)  . Fish Allergy     rash  . Neosporin [Neomycin-Polymyxin-Gramicidin] Itching and Rash    Patient Measurements: Height: 5\' 4"  (162.6 cm) Weight: 53.7 kg (118 lb 6.2 oz) IBW/kg (Calculated) : 54.7 HEPARIN DW (KG): 53.7   Vital Signs: Temp: 98.8 F (37.1 C) (07/26 0521) Temp Source: Oral (07/26 0521) BP: 112/63 (07/26 0521) Pulse Rate: 90 (07/26 0521)  Labs: Recent Labs    02/03/20 0716 02/03/20 0716 02/03/20 1421 02/04/20 0624 02/05/20 0512  HGB 9.5*   < >  --  9.2* 9.4*  HCT 28.7*  --   --  28.7* 29.2*  PLT 236  --   --  273 270  HEPARINUNFRC 0.67   < > 0.65 0.64 0.64  CREATININE 0.92  --   --   --  0.90   < > = values in this interval not displayed.    Estimated Creatinine Clearance: 45.8 mL/min (by C-G formula based on SCr of 0.9 mg/dL).   Medical History: Past Medical History:  Diagnosis Date  . Allergy   . Anemia    pernicious anemia  . Asthma   . Blood transfusion without reported diagnosis   . CKD (chronic kidney disease)   . COPD (chronic obstructive pulmonary disease) ([place]) 12/23/2019  . Gastritis   . Glaucoma   . History of blood in urine   . Hyperlipidemia   . Hypertension   . Ocular hypertension   . Osteoporosis   . Type 2 diabetes mellitus ([place]) 12/23/2019  . Vitamin B12 deficiency anemia due to intrinsic factor deficiency     Medications:  Medications Prior to Admission  Medication Sig Dispense Refill Last Dose  . acetaminophen (TYLENOL) 325 MG tablet Take 2 tablets (650 mg total) by mouth every 6 (six) hours as needed for mild pain (or Fever >/= 101). 12 tablet 0 01/22/2020  . albuterol (VENTOLIN HFA) 108 (90 Base)  MCG/ACT inhaler Inhale 2 puffs into the lungs every 4 (four) hours as needed for wheezing or shortness of breath. 8 g 2   . amLODipine (NORVASC) 10 MG tablet Take 1 tablet (10 mg total) by mouth daily. 30 tablet 1 02/01/2020  . cyanocobalamin (,VITAMIN B-12,) 1000 MCG/ML injection Inject 1 mL (1,000 mcg total) into the muscle every 30 (thirty) days. 30 mL 0 01/27/2020  . dorzolamide (TRUSOPT) 2 % ophthalmic solution Place 1 drop into both eyes 2 (two) times daily.   02/01/2020  . feeding supplement, ENSURE ENLIVE, (ENSURE ENLIVE) LIQD Take 237 mLs by mouth 2 (two) times daily between meals.   2/95/2841  . folic acid (FOLVITE) 1 MG tablet Take 1 mg by mouth daily.    02/01/2020  . gabapentin (NEURONTIN) 100 MG capsule Take 100 mg by mouth at bedtime.   01/31/2020  . latanoprost (XALATAN) 0.005 % ophthalmic solution Place 1 drop into both eyes at bedtime.   01/31/2020  . megestrol (MEGACE) 400 MG/10ML suspension Take 10 mLs (400 mg total) by mouth daily. 480 mL 0 01/31/2020  . metoCLOPramide (REGLAN) 5 MG tablet Take 1 tablet (5 mg total) by mouth 3 (three) times daily before meals. 90 tablet 1 02/01/2020  . omeprazole (PRILOSEC) 40 MG capsule Take 40 mg  by mouth daily.    02/01/2020  . ondansetron (ZOFRAN ODT) 4 MG disintegrating tablet Take 1 tablet (4 mg total) by mouth every 8 (eight) hours as needed for nausea or vomiting. Dissolve under tongue 60 tablet 2   . ALPRAZolam (XANAX) 0.5 MG tablet for sedation before MRI scan; take 1 tab 1 hour before scan; may repeat 1 tab 15 min before scan (Patient not taking: Reported on 02/03/2020) 3 tablet 0 Not Taking at Unknown time   Scheduled:  . sodium chloride   Intravenous Once  . amLODipine  10 mg Oral Daily  . Chlorhexidine Gluconate Cloth  6 each Topical Daily  . cyanocobalamin  1,000 mcg Intramuscular Q30 days  . dorzolamide  1 drop Both Eyes BID  . feeding supplement (ENSURE ENLIVE)  237 mL Oral BID BM  . folic acid  1 mg Oral Daily  . gabapentin  100 mg  Oral QHS  . latanoprost  1 drop Both Eyes QHS  . metoCLOPramide  5 mg Oral TID AC  . nutrition supplement (JUVEN)  1 packet Oral BID BM  . pantoprazole  40 mg Oral Daily   Infusions:  . sodium chloride 50 mL/hr at 02/05/20 0534  . heparin 800 Units/hr (02/05/20 0534)   PRN: albuterol, ALPRAZolam Anti-infectives (From admission, onward)   None      Assessment: Traci Parker a 74 y.o. female requires anticoagulation with a heparin iv infusion for the indication of  DVT. Heparin gtt will be started following pharmacy protocol per pharmacy consult. Patient is not on previous oral anticoagulant that will require aPTT/HL correlation before transitioning to only HL monitoring.   HL 0.64- remains therapeutic  Goal of Therapy:  Heparin level 0.3-0.7 units/ml Monitor platelets by anticoagulation protocol: Yes   Plan:  Continue heparin infusion at 800 units/hr Check anti-Xa level  daily while on heparin Continue to monitor H&H and platelets  Isac Sarna, BS Vena [place], BCPS Clinical Pharmacist Pager [phone] 02/05/2020 7:45 AM

## 2020-02-06 ENCOUNTER — Inpatient Hospital Stay (HOSPITAL_COMMUNITY): Payer: Medicare Other

## 2020-02-06 ENCOUNTER — Other Ambulatory Visit (HOSPITAL_COMMUNITY): Payer: Self-pay | Admitting: *Deleted

## 2020-02-06 DIAGNOSIS — N183 Chronic kidney disease, stage 3 unspecified: Secondary | ICD-10-CM | POA: Diagnosis not present

## 2020-02-06 DIAGNOSIS — J449 Chronic obstructive pulmonary disease, unspecified: Secondary | ICD-10-CM | POA: Diagnosis not present

## 2020-02-06 DIAGNOSIS — I824Y2 Acute embolism and thrombosis of unspecified deep veins of left proximal lower extremity: Secondary | ICD-10-CM | POA: Diagnosis not present

## 2020-02-06 DIAGNOSIS — D509 Iron deficiency anemia, unspecified: Secondary | ICD-10-CM

## 2020-02-06 DIAGNOSIS — E871 Hypo-osmolality and hyponatremia: Secondary | ICD-10-CM | POA: Diagnosis not present

## 2020-02-06 LAB — CBC
HCT: 31.6 % — ABNORMAL LOW (ref 36.0–46.0)
Hemoglobin: 10 g/dL — ABNORMAL LOW (ref 12.0–15.0)
MCH: 25.3 pg — ABNORMAL LOW (ref 26.0–34.0)
MCHC: 31.6 g/dL (ref 30.0–36.0)
MCV: 79.8 fL — ABNORMAL LOW (ref 80.0–100.0)
Platelets: 315 10*3/uL (ref 150–400)
RBC: 3.96 MIL/uL (ref 3.87–5.11)
WBC: 5.3 10*3/uL (ref 4.0–10.5)
nRBC: 0 % (ref 0.0–0.2)

## 2020-02-06 LAB — BASIC METABOLIC PANEL
Anion gap: 9 (ref 5–15)
BUN: 17 mg/dL (ref 8–23)
CO2: 19 mmol/L — ABNORMAL LOW (ref 22–32)
Calcium: 8.6 mg/dL — ABNORMAL LOW (ref 8.9–10.3)
Chloride: 105 mmol/L (ref 98–111)
Creatinine, Ser: 0.86 mg/dL (ref 0.44–1.00)
GFR calc Af Amer: >60 mL/min (ref >60)
GFR calc non Af Amer: >60 mL/min (ref >60)
Glucose, Bld: 89 mg/dL (ref 70–99)
Potassium: 3.8 mmol/L (ref 3.5–5.1)
Sodium: 133 mmol/L — ABNORMAL LOW (ref 135–145)

## 2020-02-06 MED ORDER — POLYETHYLENE GLYCOL 3350 17 G PO PACK
17.0000 g | PACK | Freq: Every day | ORAL | Status: DC
  Administered 2020-02-06 – 2020-02-09 (×3): 17 g via ORAL
  Filled 2020-02-06 (×4): qty 1

## 2020-02-06 MED ORDER — IOHEXOL 9 MG/ML PO SOLN
ORAL | Status: AC
  Filled 2020-02-06: qty 1000

## 2020-02-06 MED ORDER — BISACODYL 10 MG RE SUPP
10.0000 mg | Freq: Once | RECTAL | Status: AC
  Administered 2020-02-06: 10 mg via RECTAL
  Filled 2020-02-06: qty 1

## 2020-02-06 MED ORDER — IOHEXOL 9 MG/ML PO SOLN: 500.0000 mL | ORAL | Status: AC

## 2020-02-06 MED ORDER — IOHEXOL 300 MG/ML  SOLN
75.0000 mL | Freq: Once | INTRAMUSCULAR | Status: AC | PRN
  Administered 2020-02-06: 75 mL via INTRAVENOUS

## 2020-02-06 MED ORDER — IOHEXOL 300 MG/ML  SOLN: 75.0000 mL | Freq: Once | INTRAMUSCULAR | Status: DC | PRN

## 2020-02-06 NOTE — Progress Notes (Signed)
PROGRESS NOTE    Traci Parker  AQT:[id number] DOB: 1945/10/30 DOA: 02/01/2020 PCP: Doree Albee, MD    Brief Narrative:  74 year old female with a history of anemia, hypertension who was recently admitted to the skilled nursing facility for generalized weakness, ataxia and difficulty with ambulation.  She was sent to the emergency room after she was found to be hyponatremic on labs.  She was admitted for IV fluids.  Further work-up indicated that she has left lower extremity DVT.  She is been started on anticoagulation.   Assessment & Plan:   Active Problems:   Hyperlipidemia   Hypertension   Failure to thrive in adult   COPD (chronic obstructive pulmonary disease) (HCC)   CKD (chronic kidney disease)   Anemia due to chronic renal failure treated with erythropoietin, stage 3 (moderate)   Hyponatremia   Malnutrition of moderate degree   Pressure injury of skin   Acute deep vein thrombosis (DVT) of left lower extremity ([place])   1. Hyponatremia.  Likely related to hypovolemia from poor p.o. intake.  Started on intravenous saline with improvement of sodium.  Her p.o. intake has been poor since she is unable to eat due to lack of teeth.  Diet changed to pured diet. 2. Anemia.  Patient has anemia of chronic disease as well as B12 deficiency.  She has been receiving ESA as an outpatient.  Hemoglobin on admission trended down to 7.7 with IV hydration.  She received 1 unit PRBC with improvement of hemoglobin to 9.5.  Continue to monitor. 3. Left lower extremity DVT.  Patient was nonambulatory and taking Megace as an outpatient.  She was found to have extensive DVT in the left lower extremity.  Started on intravenous heparin.  Discontinue Megace.  Case reviewed with Dr. Carlis Abbott on-call for vascular surgery.  Due to her comorbidities, poor functional status, and lack of significant lower extremity symptoms, she was not felt to be in need of a thrombectomy at this time. Recommendations were for  continued anticoagulation.  She has been transitioned to Eliquis.  With concurrent hyponatremia, failure to thrive and extensive DVT, CT chest abd/pelvis was checked to evaluate for underlying malignancy. Fortunately, no obvious malignancy noted on imaging. 4. Failure to thrive/generalized weakness/ataxia.  She was at nursing facility for ataxia and difficulty with ambulation. I discussed her care with her primary neurologist, Dr. Leta Baptist who requested that MRI C-spine and MRI T-spine be obtained in her hospitalization due to significant barriers in obtaining this as an outpatient.  MRI T-spine performed was read as a normal study.  MRI C-spine shows degenerative changes with cervical spinal stenosis with mass-effect on spinal cord.  Case was reviewed with Dr. Newman Pies on-call for neurosurgery.  Due to the chronic nature of her symptoms, as well as her age and comorbidities, she would be a high risk surgical candidate.  It was not felt that surgical intervention was urgently needed at this time.  Recommendations were to continue with physical therapy and follow-up with neurosurgery as an outpatient. Steroids were not recommended at this time.  5. COPD.  No shortness of breath or wheezing at this time. 6. Hypertension.  Stable on Norvasc. 7. Malnutrition of moderate degree.  Nutrition following.  Pressure injury: Pressure Injury 02/01/20 Sacrum Stage 2 -  Partial thickness loss of dermis presenting as a shallow open injury with a red, pink wound bed without slough. (Active)  02/01/20 2033  Location: Sacrum  Location Orientation:   Staging: Stage 2 -  Partial thickness loss of dermis presenting as a shallow open injury with a red, pink wound bed without slough.  Wound Description (Comments):   Present on Admission: Yes  -Continue supportive measures       DVT prophylaxis: Eliquis Code Status: Full code Family Communication: Discussed with her daughter over the phone 7/26 Disposition  Plan: Status is: Inpatient  Remains inpatient appropriate because:Inpatient level of care appropriate due to severity of illness   Dispo: The patient is from: SNF              Anticipated d/c is to: SNF              Anticipated d/c date is: 1 day              Patient currently is medically stable to d/c.    Consultants:     Procedures:     Antimicrobials:       Subjective: No shortness of breath. Overall weakness is better. No pain.  Objective: Vitals:   02/05/20 1443 02/05/20 2118 02/06/20 0519 02/06/20 1424  BP: 107/75 105/69 115/68 105/81  Pulse: 93 97 96 92  Resp: 16 16 16    Temp: 97.9 F (36.6 C) 98.1 F (36.7 C) 98.5 F (36.9 C) (!) 97.3 F (36.3 C)  TempSrc: Oral Oral Oral Oral  SpO2: 100% 96% 100% 100%  Weight:      Height:        Intake/Output Summary (Last 24 hours) at 02/06/2020 2104 Last data filed at 02/06/2020 1300 Gross per 24 hour  Intake 240 ml  Output 650 ml  Net -410 ml   Filed Weights   02/01/20 1644 02/01/20 2135  Weight: 50.3 kg 53.7 kg    Examination:  General exam: Alert, awake, oriented x 3 Respiratory system: Clear to auscultation. Respiratory effort normal. Cardiovascular system:RRR. No murmurs, rubs, gallops. Gastrointestinal system: Abdomen is nondistended, soft and nontender. No organomegaly or masses felt. Normal bowel sounds heard. Central nervous system: Alert and oriented. No focal neurological deficits. Extremities: 1+ edema in LLE Skin: No rashes, lesions or ulcers Psychiatry: Judgement and insight appear normal. Mood & affect appropriate.      Data Reviewed: I have personally reviewed following labs and imaging studies  CBC: Recent Labs  Lab 02/01/20 1724 02/01/20 1724 02/02/20 0423 02/03/20 0716 02/04/20 0624 02/05/20 0512 02/06/20 0431  WBC 10.6*   < > 7.4 4.8 4.7 5.0 5.3  NEUTROABS 8.3*  --   --   --   --   --   --   HGB 9.1*   < > 7.7* 9.5* 9.2* 9.4* 10.0*  HCT 28.1*   < > 23.5* 28.7* 28.7*  29.2* 31.6*  MCV 74.1*   < > 74.6* 76.5* 78.2* 78.3* 79.8*  PLT 271   < > 231 236 273 270 315   < > = values in this interval not displayed.   Basic Metabolic Panel: Recent Labs  Lab 02/01/20 1724 02/02/20 0423 02/03/20 0716 02/05/20 0512 02/06/20 0431  NA 120* 124* 128* 132* 133*  K 4.4 3.9 3.5 3.5 3.8  CL 89* 94* 97* 104 105  CO2 21* 21* 21* 19* 19*  GLUCOSE 140* 101* 97 95 89  BUN 27* 22 16 19 17   CREATININE 1.28* 1.09* 0.92 0.90 0.86  CALCIUM 8.9 8.5* 8.5* 8.6* 8.6*  MG 1.9  --   --   --   --    GFR: Estimated Creatinine Clearance: 47.9 mL/min (by C-G formula based  on SCr of 0.86 mg/dL). Liver Function Tests: Recent Labs  Lab 02/01/20 1724 02/05/20 0512  AST 12* 9*  ALT 11 10  ALKPHOS 72 48  BILITOT 0.5 0.5  PROT 6.8 5.2*  ALBUMIN 3.4* 2.6*   No results for input(s): LIPASE, AMYLASE in the last 168 hours. No results for input(s): AMMONIA in the last 168 hours. Coagulation Profile: No results for input(s): INR, PROTIME in the last 168 hours. Cardiac Enzymes: No results for input(s): CKTOTAL, CKMB, CKMBINDEX, TROPONINI in the last 168 hours. BNP (last 3 results) No results for input(s): PROBNP in the last 8760 hours. HbA1C: No results for input(s): HGBA1C in the last 72 hours. CBG: No results for input(s): GLUCAP in the last 168 hours. Lipid Profile: No results for input(s): CHOL, HDL, LDLCALC, TRIG, CHOLHDL, LDLDIRECT in the last 72 hours. Thyroid Function Tests: No results for input(s): TSH, T4TOTAL, FREET4, T3FREE, THYROIDAB in the last 72 hours. Anemia Panel: No results for input(s): VITAMINB12, FOLATE, FERRITIN, TIBC, IRON, RETICCTPCT in the last 72 hours. Sepsis Labs: No results for input(s): PROCALCITON, LATICACIDVEN in the last 168 hours.  Recent Results (from the past 240 hour(s))  SARS Coronavirus 2 by RT PCR (hospital order, performed in [facility] hospital lab) Nasopharyngeal Nasopharyngeal Swab     Status: None   Collection Time: 02/01/20   6:42 PM   Specimen: Nasopharyngeal Swab  Result Value Ref Range Status   SARS Coronavirus 2 NEGATIVE NEGATIVE Final    Comment: (NOTE) SARS-CoV-2 target nucleic acids are NOT DETECTED.  The SARS-CoV-2 RNA is generally detectable in upper and lower respiratory specimens during the acute phase of infection. The lowest concentration of SARS-CoV-2 viral copies this assay can detect is 250 copies / mL. A negative result does not preclude SARS-CoV-2 infection and should not be used as the sole basis for treatment or other patient management decisions.  A negative result may occur with improper specimen collection / handling, submission of specimen other than nasopharyngeal swab, presence of viral mutation(s) within the areas targeted by this assay, and inadequate number of viral copies (<250 copies / mL). A negative result must be combined with clinical observations, patient history, and epidemiological information.  Fact Sheet for Patients:   StrictlyIdeas.no  Fact Sheet for Healthcare Providers: BankingDealers.co.za  This test is not yet approved or  cleared by the [place] FDA and has been authorized for detection and/or diagnosis of SARS-CoV-2 by FDA under an Emergency Use Authorization (EUA).  This EUA will remain in effect (meaning this test can be used) for the duration of the COVID-19 declaration under Section 564(b)(1) of the Act, 21 U.S.C. section 360bbb-3(b)(1), unless the authorization is terminated or revoked sooner.  Performed at [facility] [address] [place], [place] [address]          Radiology Studies: MR CERVICAL SPINE W WO CONTRAST  Result Date: 02/05/2020 CLINICAL DATA:  74 year old female with pain for 2 months. No known injury. Ataxia. EXAM: MRI CERVICAL SPINE WITHOUT AND WITH CONTRAST TECHNIQUE: Multiplanar and multiecho pulse sequences of the cervical spine, to include the craniocervical junction  and cervicothoracic junction, were obtained without and with intravenous contrast. CONTRAST:  99mL GADAVIST GADOBUTROL 1 MMOL/ML IV SOLN COMPARISON:  Brain MRI 01/05/2020. FINDINGS: Study is mildly degraded by motion artifact despite repeated imaging attempts. Alignment: Straightening of cervical lordosis. No spondylolisthesis. Vertebrae: No marrow edema or evidence of acute osseous abnormality. Degenerative endplate marrow signal changes in the cervical spine. T1 vertebral body hemangioma. Cord:  Multilevel degenerative cervical spinal cord mass effect, with appearance suspicious for some spinal cord myelomalacia at the C6 level greater on the left (series 19, image 20). No abnormal intradural enhancement. No dural thickening. Posterior Fossa, vertebral arteries, paraspinal tissues: Cervicomedullary junction, visible posterior fossa and brain parenchyma appear stable from last month. Preserved major vascular flow voids in the neck. The left vertebral artery appears mildly dominant. Negative visible neck soft tissues, right lung apex. Disc levels: C2-C3:  Mild facet and ligament flavum hypertrophy.  No stenosis. C3-C4: Mild circumferential disc bulge and endplate spurring. Broad-based posterior component with mild spinal stenosis and cord mass effect. Mild to moderate bilateral C4 foraminal stenosis. C4-C5: Disc space loss with circumferential disc osteophyte complex eccentric to the right (series 19, image 12). Mild facet hypertrophy. Mild to moderate spinal stenosis and spinal cord mass effect greater on the right. Severe right greater than left C5 foraminal stenosis. C5-C6: Disc space loss. Circumferential disc osteophyte complex with broad-based posterior component. Mild to moderate spinal stenosis and spinal cord mass effect. Moderate to severe left greater than right C6 foraminal stenosis. C6-C7: Disc space loss. Circumferential disc osteophyte complex. Broad-based posterior component with mild spinal stenosis  and cord mass effect. Relative sparing of the neural foramina. Mild left C7 foraminal stenosis. C7-T1: Mild endplate spurring, facet and ligament flavum hypertrophy. No spinal stenosis. Mild to moderate left C8 foraminal stenosis. IMPRESSION: 1. Widespread cervical spine degeneration. No acute osseous abnormality. 2. Multilevel mild to moderate spinal stenosis and spinal cord mass effect at C3-C4 through C6-C7, with possible spinal cord myelomalacia at the C6 level. No other cord signal abnormality. 3. Moderate or severe degenerative neural foraminal stenosis at the bilateral C5 and C6 nerve levels. Electronically Signed   By: Genevie Ann M.D.   On: 02/05/2020 16:51   MR THORACIC SPINE W WO CONTRAST  Result Date: 02/05/2020 CLINICAL DATA:  74 year old female with pain for 2 months. No known injury. Ataxia. EXAM: MRI THORACIC WITHOUT AND WITH CONTRAST TECHNIQUE: Multiplanar and multiecho pulse sequences of the thoracic spine were obtained without and with intravenous contrast. CONTRAST:  53mL GADAVIST GADOBUTROL 1 MMOL/ML IV SOLN COMPARISON:  Cervical spine MRI today reported separately. FINDINGS: Axial images are mildly degraded by motion artifact despite repeated imaging attempts. Limited cervical spine imaging:  Reported separately today. Thoracic spine segmentation:  Appears to be normal. Alignment:  Normal thoracic kyphosis.  No spondylolisthesis. Vertebrae: Benign vertebral hemangioma in the T1 body. Normal background bone marrow signal. No marrow edema or evidence of acute osseous abnormality. Cord: Normal thoracic spinal cord signal and morphology. The conus medullaris appears normal at L1-L2. No abnormal intradural enhancement. No dural thickening. Paraspinal and other soft tissues: Negative. Disc levels: No age advanced thoracic spine degeneration. No significant disc herniation and no thoracic spinal stenosis. No thoracic foraminal stenosis. IMPRESSION: Normal for age MRI appearance of the thoracic spine.  Electronically Signed   By: Genevie Ann M.D.   On: 02/05/2020 16:58   CT CHEST ABDOMEN PELVIS W CONTRAST  Result Date: 02/06/2020 CLINICAL DATA:  Hyponatremia, anemia, and lower extremity DVT. Evaluation for underlying malignancy. EXAM: CT CHEST, ABDOMEN, AND PELVIS WITH CONTRAST TECHNIQUE: Multidetector CT imaging of the chest, abdomen and pelvis was performed following the standard protocol during bolus administration of intravenous contrast. CONTRAST:  48mL OMNIPAQUE IOHEXOL 300 MG/ML  SOLN COMPARISON:  CTA chest 12/23/2019. CT abdomen and pelvis 12/14/2019. FINDINGS: CT CHEST FINDINGS Cardiovascular: Mild thoracic aortic atherosclerosis without aneurysm. Normal heart size. No pericardial effusion.  Mediastinum/Nodes: 1 cm low-density left thyroid nodule, not considered clinically significant and for which no follow-up imaging is recommended. No enlarged axillary, mediastinal, or hilar lymph nodes. Unremarkable esophagus. Lungs/Pleura: No pleural effusion or pneumothorax. Mild paraseptal emphysema. Unchanged 3 mm right lower lobe nodule along the major fissure (series 4, image 78). Unchanged 2 mm nodule along the right minor fissure (series 4, image 86). Unchanged 2 mm subpleural nodule in the right middle lobe (series 4, image 91). Unchanged 3 mm subpleural nodule in the right middle lobe (series 4, image 111). Unchanged 3 mm left upper lobe nodule (series 4, image 79). No mass. Minimal atelectasis or scarring in the lung bases. Musculoskeletal: No acute osseous abnormality or suspicious osseous lesion. CT ABDOMEN PELVIS FINDINGS Hepatobiliary: Several subcentimeter low-density lesions in the liver measuring up to 5 mm, too small to fully characterize. No significant biliary dilatation status post cholecystectomy. Pancreas: Unremarkable. Spleen: Unremarkable. Adrenals/Urinary Tract: Unremarkable adrenal glands. 2 punctate unchanged right renal calculi. Poor visualization of the single punctate left renal calculus  shown on the prior CT. No hydronephrosis. Unchanged 1.6 cm left lower pole renal cyst. Additional subcentimeter hypodensities in both kidneys, too small to fully characterize. Unremarkable bladder. Stomach/Bowel: The stomach is unremarkable. There is a large amount of stool in the rectum which is distended to 7 cm in diameter with new moderate presacral soft tissue thickening/stranding. A large amount of right-sided colonic stool is also present. There is no evidence of bowel obstruction. The appendix is not clearly identified, however there is no Peri cecal inflammation. Vascular/Lymphatic: Aortic atherosclerosis. Unchanged ectasia of the infrarenal abdominal aorta with maximal diameter of 2.7 cm. Partial imaging of left femoral DVT, more fully evaluated on 02/02/2020 ultrasound. No enlarged lymph nodes. Reproductive: Uterus and bilateral adnexa are unremarkable. Other: No ascites or pneumoperitoneum. Partially visualized left thigh edema in the setting of the known extensive DVT. Musculoskeletal: No acute osseous abnormality or suspicious osseous lesion. IMPRESSION: 1. No definite evidence of malignancy in the chest, abdomen, or pelvis. 2. Unchanged small bilateral lung nodules measuring up to 3 mm. Non-contrast chest CT can be considered in 12 months. This recommendation follows the consensus statement: Guidelines for Management of Incidental Pulmonary Nodules Detected on CT Images: From the Fleischner Society 2017; Radiology 2017; 284:228-243. 3. Large stool burden with rectal distension and presacral inflammation which may reflect stercoral proctitis. 4. Known left lower extremity DVT. 5. Nonobstructing nephrolithiasis. 6. Aortic Atherosclerosis (ICD10-I70.0) and Emphysema (ICD10-J43.9). Electronically Signed   By: Logan Bores M.D.   On: 02/06/2020 13:05        Scheduled Meds:  sodium chloride   Intravenous Once   amLODipine  10 mg Oral Daily   apixaban  10 mg Oral BID   Followed by   Derrill Memo ON  02/12/2020] apixaban  5 mg Oral BID   Chlorhexidine Gluconate Cloth  6 each Topical Daily   cyanocobalamin  1,000 mcg Intramuscular Q30 days   dorzolamide  1 drop Both Eyes BID   feeding supplement (ENSURE ENLIVE)  237 mL Oral BID BM   folic acid  1 mg Oral Daily   gabapentin  100 mg Oral QHS   iohexol       latanoprost  1 drop Both Eyes QHS   metoCLOPramide  5 mg Oral TID AC   nutrition supplement (JUVEN)  1 packet Oral BID BM   pantoprazole  40 mg Oral Daily   polyethylene glycol  17 g Oral Daily   Continuous Infusions:  sodium chloride  50 mL/hr at 02/06/20 1447     LOS: 5 days    Time spent: 22mins    Kathie Dike, MD [facility]   If 7PM-7AM, please contact night-coverage [email or web address]  02/06/2020, 9:04 PM

## 2020-02-06 NOTE — NC FL2 (Signed)
[place] LEVEL OF CARE SCREENING TOOL     IDENTIFICATION  Patient Name: Traci Parker Birthdate: [birth year] Sex: female Admission Date (Current Location): 02/01/2020  [facility] and [place] Number:  [employer] and Address:  [facility] [address], [place]      Provider Number:    Attending Physician Name and Address:  Kathie Dike, MD  Relative Name and Phone Number:  Milyn Stapleton [phone]    Current Level of Care: Hospital Recommended Level of Care: [place] Prior Approval Number: [id number]  Date Approved/Denied:   PASRR Number:    Discharge Plan: SNF    Current Diagnoses: Patient Active Problem List   Diagnosis Date Noted  . Malnutrition of moderate degree 02/02/2020  . Pressure injury of skin 02/02/2020  . Acute deep vein thrombosis (DVT) of left lower extremity ([place]) 02/02/2020  . Hyponatremia 02/01/2020  . Ataxia 01/09/2020  . Thyroiditis 01/08/2020  . Peripheral neuropathy 01/08/2020  . CKD stage 3 due to type 2 diabetes mellitus ([place]) 01/05/2020  . Hypertension associated with stage 3 chronic kidney disease due to type 2 diabetes mellitus ([place]) 01/05/2020  . E-coli UTI 01/05/2020  . Anemia due to chronic renal failure treated with erythropoietin, stage 3 (moderate) 01/05/2020  . Increased intraocular pressure, bilateral 01/05/2020  . Vitamin B 12 deficiency 01/05/2020  . History of colon polyps 01/01/2020  . CKD (chronic kidney disease) 01/01/2020  . Tachycardia 12/29/2019  . Lung nodule < 6cm on CT 12/24/2019  . Failure to thrive in adult 12/23/2019  . COPD (chronic obstructive pulmonary disease) ([place]) 12/23/2019  . Type 2 diabetes mellitus ([place]) 12/23/2019  . Hypokalemia 12/23/2019  . Hypomagnesemia 12/23/2019  . Fall 12/23/2019  . Intractable nausea and vomiting 12/19/2019  . Refractory nausea and vomiting 12/18/2019  . Volume depletion 12/18/2019  . Weight loss  12/18/2019  . Chronic gastritis 12/18/2019  . Dehydration 12/13/2019  . Nausea and vomiting 10/30/2019  . Fatigue 10/30/2019  . Osteoporosis 09/20/2019  . Hypertension 09/20/2019  . Microcytic anemia 05/08/2019  . Screening for breast cancer 03/23/2019  . Hyperlipidemia 03/23/2019  . Pre-diabetes 03/23/2019  . Open-angle glaucoma 03/23/2019    Orientation RESPIRATION BLADDER Height & Weight     Self, Time, Situation, Place  Normal Incontinent, External catheter Weight: 118 lb 6.2 oz (53.7 kg) Height:  5\' 4"  (162.6 cm)  BEHAVIORAL SYMPTOMS/MOOD NEUROLOGICAL BOWEL NUTRITION STATUS      Continent Diet (Fluid consistency: Thin)  AMBULATORY STATUS COMMUNICATION OF NEEDS Skin   Extensive Assist Verbally Other (Comment) (Pressure injury stage 2 sacral)                       Personal Care Assistance Level of Assistance    Bathing Assistance: Maximum assistance Feeding assistance: Limited assistance Dressing Assistance: Maximum assistance     Functional Limitations Info  Sight, Hearing, Speech Sight Info: Adequate Hearing Info: Adequate Speech Info: Adequate    SPECIAL CARE FACTORS FREQUENCY  PT (By licensed PT)     PT Frequency: 5 x per week              Contractures      Additional Factors Info  Code Status, Allergies Code Status Info: Full Allergies Info: Latex, LevofloxacinNeomycin-bacitracin-polymyxin,   bacitracin-neomycin-polymyxin, Prednisone           Current Medications (02/06/2020):  This is the current hospital active medication list Current Facility-Administered Medications  Medication Dose Route  Frequency Provider Last Rate Last Admin  . 0.9 %  sodium chloride infusion (Manually program via Guardrails IV Fluids)   Intravenous Once Kathie Dike, MD      . 0.9 %  sodium chloride infusion   Intravenous Continuous Elgergawy, Silver Huguenin, MD 50 mL/hr at 02/06/20 1447 New Bag at 02/06/20 1447  . albuterol (PROVENTIL) (2.5 MG/3ML) 0.083% nebulizer  solution 3 mL  3 mL Inhalation Q4H PRN Elgergawy, Silver Huguenin, MD      . ALPRAZolam Duanne Moron) tablet 0.25 mg  0.25 mg Oral BID PRN Elgergawy, Silver Huguenin, MD      . amLODipine (NORVASC) tablet 10 mg  10 mg Oral Daily Elgergawy, Silver Huguenin, MD   10 mg at 02/06/20 0900  . apixaban (ELIQUIS) tablet 10 mg  10 mg Oral BID Kathie Dike, MD   10 mg at 02/06/20 0900   Followed by  . [START ON 02/12/2020] apixaban (ELIQUIS) tablet 5 mg  5 mg Oral BID Kathie Dike, MD      . Chlorhexidine Gluconate Cloth 2 % PADS 6 each  6 each Topical Daily Kathie Dike, MD   6 each at 02/06/20 0905  . cyanocobalamin ((VITAMIN B-12)) injection 1,000 mcg  1,000 mcg Intramuscular Q30 days Elgergawy, Silver Huguenin, MD   1,000 mcg at 02/01/20 2149  . dorzolamide (TRUSOPT) 2 % ophthalmic solution 1 drop  1 drop Both Eyes BID Elgergawy, Silver Huguenin, MD   1 drop at 02/06/20 0901  . feeding supplement (ENSURE ENLIVE) (ENSURE ENLIVE) liquid 237 mL  237 mL Oral BID BM Elgergawy, Silver Huguenin, MD   237 mL at 02/06/20 1427  . folic acid (FOLVITE) tablet 1 mg  1 mg Oral Daily Elgergawy, Silver Huguenin, MD   1 mg at 02/06/20 0900  . gabapentin (NEURONTIN) capsule 100 mg  100 mg Oral QHS Elgergawy, Silver Huguenin, MD   100 mg at 02/05/20 2229  . iohexol (OMNIPAQUE) 9 MG/ML oral solution           . latanoprost (XALATAN) 0.005 % ophthalmic solution 1 drop  1 drop Both Eyes QHS Elgergawy, Silver Huguenin, MD   1 drop at 02/05/20 2230  . metoCLOPramide (REGLAN) tablet 5 mg  5 mg Oral TID AC Elgergawy, Silver Huguenin, MD   5 mg at 02/06/20 1135  . nutrition supplement (JUVEN) (JUVEN) powder packet 1 packet  1 packet Oral BID BM Kathie Dike, MD   1 packet at 02/06/20 1427  . pantoprazole (PROTONIX) EC tablet 40 mg  40 mg Oral Daily Elgergawy, Silver Huguenin, MD   40 mg at 02/06/20 0900  . polyethylene glycol (MIRALAX / GLYCOLAX) packet 17 g  17 g Oral Daily Kathie Dike, MD   17 g at 02/06/20 1427     Discharge Medications: Please see discharge summary for a list of discharge  medications.  Relevant Imaging Results:  Relevant Lab Results:   Additional Information    Natasha Bence, LCSW

## 2020-02-06 NOTE — Progress Notes (Signed)
Physical Therapy Treatment Patient Details Name: Traci Parker MRN: [id number] DOB: [birth year] Today's Date: 02/06/2020    History of Present Illness Traci Parker  is a 74 y.o. female,  with medical history significant of seasonal allergies, pernicious anemia, asthma/COPD, history of blood transfusion, glaucoma, history of hematuria, hypertension, osteoporosis, type 2 diabetes with recent hospitalization due to pernicious anemia and failure to thrive, patient was sent from [facility] for hyponatremia, sodium sodium level was 120 on blood draw this morning at the facility, patient herself denies any complaint, reports she has been feeling at baseline, patient reports at baseline she has poor appetite as she does not like her dysphagia to diet given she has no teeth or dentures, denies any nausea, vomiting, diarrhea, no fever, no chills, no chest pain or shortness of breath, and by reviewing SNF records does not appear she was started on any new medications which would contribute to her hyponatremia.    PT Comments    Pt motivated to participate in therapy. Pt tolerates supine BLE strengthening exercises with occasional cues for form. Therapist cued pt for sequencing with coming to sitting at EOB and provides mod A to come to sitting safely. Sitting balance fair with occasional trunk lean R and L, able to find midline with verbal cues and close SUPV. With much encouragement pt stands with max A, able to maintain for 30 sec with max A and holding onto therapist before returning to sitting. Pt requires L knee blocked to prevent buckling. Pt fatigued with mobility, requiring max A to reposition comfortably in bed. Pt very appreciative of therapy session. Patient will benefit from continued physical therapy in hospital and recommendations below to increase strength, balance, endurance for safe ADLs and gait.    Follow Up Recommendations  SNF     Equipment Recommendations  None recommended by PT     Recommendations for Other Services       Precautions / Restrictions Precautions Precautions: Fall Restrictions Weight Bearing Restrictions: No    Mobility  Bed Mobility Overal bed mobility: Needs Assistance Bed Mobility: Supine to Sit;Sit to Supine  Supine to sit: Mod assist;HOB elevated Sit to supine: Mod assist   General bed mobility comments: cues for sequencing to come to EOB, limited use of UE to push through elbow requiring mod A to upright trunk; mod A to lift BLE back into bed and for controlled lowering of trunk to supine; limited ability to obtain hooklying position and push through heels to assist in scooting up in bed and repositioning requiring overall max A  Transfers Overall transfer level: Needs assistance Equipment used: 1 person hand held assist Transfers: Sit to/from Stand Sit to Stand: Max assist  General transfer comment: therapist positioned anterior to pt, blocking L knee, max A to rise and maintain standing for ~30 seconds with cues for upright posture and minimal improvement  Ambulation/Gait     Stairs          Wheelchair Mobility    Modified Rankin (Stroke Patients Only)       Balance Overall balance assessment: Needs assistance;History of Falls Sitting-balance support: Feet supported;Bilateral upper extremity supported Sitting balance-Leahy Scale: Fair Sitting balance - Comments: seated EOB, occasional trunk lean laterally R and L, able to obtain midline with verbal cues and close SUPV   Standing balance support: Bilateral upper extremity supported Standing balance-Leahy Scale: Zero Standing balance comment: max A to maintain supported standing for 30 seconds  Cognition Arousal/Alertness: Awake/alert Behavior During Therapy: WFL for tasks assessed/performed;Anxious Overall Cognitive Status: Within Functional Limits for tasks assessed            Exercises General Exercises - Lower Extremity Ankle Circles/Pumps:  Supine;Strengthening;Both;10 reps Quad Sets: Supine;Strengthening;Both;10 reps Heel Slides: Supine;Strengthening;Both;10 reps Hip ABduction/ADduction: Supine;Strengthening;Both;10 reps    General Comments        Pertinent Vitals/Pain Pain Assessment: No/denies pain    Home Living                      Prior Function            PT Goals (current goals can now be found in the care plan section) Acute Rehab PT Goals Patient Stated Goal: home after rehab PT Goal Formulation: With patient Time For Goal Achievement: 02/16/20 Potential to Achieve Goals: Good Progress towards PT goals: Progressing toward goals    Frequency    Min 3X/week      PT Plan Current plan remains appropriate    Co-evaluation              AM-PAC PT "6 Clicks" Mobility   Outcome Measure  Help needed turning from your back to your side while in a flat bed without using bedrails?: A Little Help needed moving from lying on your back to sitting on the side of a flat bed without using bedrails?: A Lot Help needed moving to and from a bed to a chair (including a wheelchair)?: A Lot Help needed standing up from a chair using your arms (e.g., wheelchair or bedside chair)?: A Lot Help needed to walk in hospital room?: Total Help needed climbing 3-5 steps with a railing? : Total 6 Click Score: 11    End of Session   Activity Tolerance: Patient limited by fatigue Patient left: in bed;with call bell/phone within reach Nurse Communication: Mobility status PT Visit Diagnosis: Unsteadiness on feet (R26.81);Other abnormalities of gait and mobility (R26.89);Muscle weakness (generalized) (M62.81);History of falling (Z91.81)     Time: 1601-0932 PT Time Calculation (min) (ACUTE ONLY): 25 min  Charges:  $Therapeutic Exercise: 8-22 mins $Therapeutic Activity: 8-22 mins                      Tori Markice Torbert PT, DPT 02/06/20, 3:20 PM [phone]

## 2020-02-07 ENCOUNTER — Inpatient Hospital Stay (HOSPITAL_COMMUNITY): Payer: Medicare Other

## 2020-02-07 DIAGNOSIS — I824Y2 Acute embolism and thrombosis of unspecified deep veins of left proximal lower extremity: Secondary | ICD-10-CM | POA: Diagnosis not present

## 2020-02-07 DIAGNOSIS — R627 Adult failure to thrive: Secondary | ICD-10-CM | POA: Diagnosis not present

## 2020-02-07 DIAGNOSIS — J449 Chronic obstructive pulmonary disease, unspecified: Secondary | ICD-10-CM | POA: Diagnosis not present

## 2020-02-07 LAB — CBC
HCT: 31.6 % — ABNORMAL LOW (ref 36.0–46.0)
Hemoglobin: 9.9 g/dL — ABNORMAL LOW (ref 12.0–15.0)
MCH: 24.9 pg — ABNORMAL LOW (ref 26.0–34.0)
MCHC: 31.3 g/dL (ref 30.0–36.0)
MCV: 79.4 fL — ABNORMAL LOW (ref 80.0–100.0)
Platelets: 278 10*3/uL (ref 150–400)
RBC: 3.98 MIL/uL (ref 3.87–5.11)
RDW: 29.2 % — ABNORMAL HIGH (ref 11.5–15.5)
WBC: 7.1 10*3/uL (ref 4.0–10.5)
nRBC: 0 % (ref 0.0–0.2)

## 2020-02-07 LAB — SARS CORONAVIRUS 2 BY RT PCR (HOSPITAL ORDER, PERFORMED IN ~~LOC~~ HOSPITAL LAB): SARS Coronavirus 2: NEGATIVE

## 2020-02-07 NOTE — Progress Notes (Signed)
PROGRESS NOTE    Traci Parker  TMH:[id number] DOB: January 08, 1946 DOA: 02/01/2020 PCP: Doree Albee, MD    Brief Narrative:  74 year old female with a history of anemia, hypertension who was recently admitted to the skilled nursing facility for generalized weakness, ataxia and difficulty with ambulation.  She was sent to the emergency room after she was found to be hyponatremic on labs.  She was admitted for IV fluids.  Further work-up indicated that she has left lower extremity DVT.  She is been started on anticoagulation. ---- Awaiting insurance approval to return to SNF  Assessment & Plan:   Active Problems:   Hyperlipidemia   Hypertension   Failure to thrive in adult   COPD (chronic obstructive pulmonary disease) (HCC)   CKD (chronic kidney disease)   Anemia due to chronic renal failure treated with erythropoietin, stage 3 (moderate)   Hyponatremia   Malnutrition of moderate degree   Pressure injury of skin   Acute deep vein thrombosis (DVT) of left lower extremity ([place])   1. Hyponatremia.  Likely related to hypovolemia from poor p.o. intake.  Started on intravenous saline with improvement of sodium.  Her p.o. intake has been poor since she is unable to eat due to lack of teeth.  Diet changed to pured diet.--Sodium improved 2. Anemia.  Patient has anemia of chronic disease as well as B12 deficiency.  She has been receiving ESA as an outpatient.  Hemoglobin on admission trended down to 7.7 with IV hydration.  She received 1 unit PRBC with improvement of hemoglobin to 9.5.  Continue to monitor. 3. Left lower extremity DVT.  Patient was nonambulatory and taking Megace as an outpatient.  She was found to have extensive DVT in the left lower extremity.  Started on intravenous heparin.  Discontinue Megace.  Case reviewed with Dr. Carlis Abbott on-call for vascular surgery.  Due to her comorbidities, poor functional status, and lack of significant lower extremity symptoms, she was not felt to be  in need of a thrombectomy at this time. Recommendations were for continued anticoagulation.  She has been transitioned to Eliquis.  With concurrent hyponatremia, failure to thrive and extensive DVT, CT chest abd/pelvis was checked to evaluate for underlying malignancy. Fortunately, no obvious malignancy noted on imaging. 4. Failure to thrive/generalized weakness/ataxia.  She was at nursing facility for ataxia and difficulty with ambulation. I discussed her care with her primary neurologist, Dr. Leta Baptist who requested that MRI C-spine and MRI T-spine be obtained in her hospitalization due to significant barriers in obtaining this as an outpatient.  MRI T-spine performed was read as a normal study.  MRI C-spine shows degenerative changes with cervical spinal stenosis with mass-effect on spinal cord.  Case was reviewed with Dr. Newman Pies on-call for neurosurgery.  Due to the chronic nature of her symptoms, as well as her age and comorbidities, she would be a high risk surgical candidate.  It was not felt that surgical intervention was urgently needed at this time.  Recommendations were to continue with physical therapy and follow-up with neurosurgery as an outpatient. Steroids were not recommended at this time.  5. COPD.  No shortness of breath or wheezing at this time. 6. Hypertension.  Stable on Norvasc. 7. Malnutrition of moderate degree.  Nutrition following.  Pressure injury: Pressure Injury 02/01/20 Sacrum Stage 2 -  Partial thickness loss of dermis presenting as a shallow open injury with a red, pink wound bed without slough. (Active)  02/01/20 2033  Location: Sacrum  Location  Orientation:   Staging: Stage 2 -  Partial thickness loss of dermis presenting as a shallow open injury with a red, pink wound bed without slough.  Wound Description (Comments):   Present on Admission: Yes  -Continue supportive measures       DVT prophylaxis: Eliquis Code Status: Full code Family  Communication: Discussed with her daughter over the phone 7/26 Disposition Plan: Status is: Inpatient  Remains inpatient appropriate because:Inpatient level of care appropriate due to severity of illness   Dispo: The patient is from: SNF              Anticipated d/c is to: SNF              Anticipated d/c date is: 1 day              Patient currently is medically stable to d/c. --- Awaiting insurance approval to return to SNF   Consultants:     Procedures:     Antimicrobials:       Subjective: No shortness of breath. Overall weakness is better. No pain. --- Awaiting insurance approval to return to SNF -No new complaints  Objective: Vitals:   02/06/20 1424 02/06/20 2252 02/07/20 0434 02/07/20 1742  BP: 105/81 120/69 111/73 113/76  Pulse: 92 (!) 109 100 96  Resp:  20 16 18   Temp: (!) 97.3 F (36.3 C) 99.5 F (37.5 C) 99 F (37.2 C) 99.7 F (37.6 C)  TempSrc: Oral Oral  Oral  SpO2: 100% 100% 97% 100%  Weight:      Height:        Intake/Output Summary (Last 24 hours) at 02/07/2020 1910 Last data filed at 02/07/2020 0100 Gross per 24 hour  Intake --  Output 700 ml  Net -700 ml   Filed Weights   02/01/20 1644 02/01/20 2135  Weight: 50.3 kg 53.7 kg    Examination:  General exam: Alert, awake, oriented x 3 Respiratory system: Clear to auscultation. Respiratory effort normal. Cardiovascular system:RRR. No murmurs, rubs, gallops. Gastrointestinal system: Abdomen is nondistended, soft and nontender. No organomegaly or masses felt. Normal bowel sounds heard. Central nervous system: Alert and oriented. No focal neurological deficits. Extremities: 1+ edema in LLE Skin: No rashes, lesions or ulcers Psychiatry: Judgement and insight appear normal. Mood & affect appropriate.      Data Reviewed:  CBC: Recent Labs  Lab 02/01/20 1724 02/02/20 0423 02/03/20 0716 02/04/20 0624 02/05/20 0512 02/06/20 0431 02/07/20 0417  WBC 10.6*   < > 4.8 4.7 5.0 5.3 7.1   NEUTROABS 8.3*  --   --   --   --   --   --   HGB 9.1*   < > 9.5* 9.2* 9.4* 10.0* 9.9*  HCT 28.1*   < > 28.7* 28.7* 29.2* 31.6* 31.6*  MCV 74.1*   < > 76.5* 78.2* 78.3* 79.8* 79.4*  PLT 271   < > 236 273 270 315 278   < > = values in this interval not displayed.   Basic Metabolic Panel: Recent Labs  Lab 02/01/20 1724 02/02/20 0423 02/03/20 0716 02/05/20 0512 02/06/20 0431  NA 120* 124* 128* 132* 133*  K 4.4 3.9 3.5 3.5 3.8  CL 89* 94* 97* 104 105  CO2 21* 21* 21* 19* 19*  GLUCOSE 140* 101* 97 95 89  BUN 27* 22 16 19 17   CREATININE 1.28* 1.09* 0.92 0.90 0.86  CALCIUM 8.9 8.5* 8.5* 8.6* 8.6*  MG 1.9  --   --   --   --  GFR: Estimated Creatinine Clearance: 47.9 mL/min (by C-G formula based on SCr of 0.86 mg/dL). Liver Function Tests: Recent Labs  Lab 02/01/20 1724 02/05/20 0512  AST 12* 9*  ALT 11 10  ALKPHOS 72 48  BILITOT 0.5 0.5  PROT 6.8 5.2*  ALBUMIN 3.4* 2.6*   No results for input(s): LIPASE, AMYLASE in the last 168 hours. No results for input(s): AMMONIA in the last 168 hours. Coagulation Profile: No results for input(s): INR, PROTIME in the last 168 hours. Cardiac Enzymes: No results for input(s): CKTOTAL, CKMB, CKMBINDEX, TROPONINI in the last 168 hours. BNP (last 3 results) No results for input(s): PROBNP in the last 8760 hours. HbA1C: No results for input(s): HGBA1C in the last 72 hours. CBG: No results for input(s): GLUCAP in the last 168 hours. Lipid Profile: No results for input(s): CHOL, HDL, LDLCALC, TRIG, CHOLHDL, LDLDIRECT in the last 72 hours. Thyroid Function Tests: No results for input(s): TSH, T4TOTAL, FREET4, T3FREE, THYROIDAB in the last 72 hours. Anemia Panel: No results for input(s): VITAMINB12, FOLATE, FERRITIN, TIBC, IRON, RETICCTPCT in the last 72 hours. Sepsis Labs: No results for input(s): PROCALCITON, LATICACIDVEN in the last 168 hours.  Recent Results (from the past 240 hour(s))  SARS Coronavirus 2 by RT PCR (hospital  order, performed in [facility] hospital lab) Nasopharyngeal Nasopharyngeal Swab     Status: None   Collection Time: 02/01/20  6:42 PM   Specimen: Nasopharyngeal Swab  Result Value Ref Range Status   SARS Coronavirus 2 NEGATIVE NEGATIVE Final    Comment: (NOTE) SARS-CoV-2 target nucleic acids are NOT DETECTED.  The SARS-CoV-2 RNA is generally detectable in upper and lower respiratory specimens during the acute phase of infection. The lowest concentration of SARS-CoV-2 viral copies this assay can detect is 250 copies / mL. A negative result does not preclude SARS-CoV-2 infection and should not be used as the sole basis for treatment or other patient management decisions.  A negative result may occur with improper specimen collection / handling, submission of specimen other than nasopharyngeal swab, presence of viral mutation(s) within the areas targeted by this assay, and inadequate number of viral copies (<250 copies / mL). A negative result must be combined with clinical observations, patient history, and epidemiological information.  Fact Sheet for Patients:   StrictlyIdeas.no  Fact Sheet for Healthcare Providers: BankingDealers.co.za  This test is not yet approved or  cleared by the [place] FDA and has been authorized for detection and/or diagnosis of SARS-CoV-2 by FDA under an Emergency Use Authorization (EUA).  This EUA will remain in effect (meaning this test can be used) for the duration of the COVID-19 declaration under Section 564(b)(1) of the Act, 21 U.S.C. section 360bbb-3(b)(1), unless the authorization is terminated or revoked sooner.  Performed at [facility], [address] [place], [place] [address]   SARS Coronavirus 2 by RT PCR (hospital order, performed in [facility] hospital lab) Nasopharyngeal Nasopharyngeal Swab     Status: None   Collection Time: 02/07/20  3:40 PM   Specimen: Nasopharyngeal Swab    Result Value Ref Range Status   SARS Coronavirus 2 NEGATIVE NEGATIVE Final    Comment: (NOTE) SARS-CoV-2 target nucleic acids are NOT DETECTED.  The SARS-CoV-2 RNA is generally detectable in upper and lower respiratory specimens during the acute phase of infection. The lowest concentration of SARS-CoV-2 viral copies this assay can detect is 250 copies / mL. A negative result does not preclude SARS-CoV-2 infection and should not be used as the  sole basis for treatment or other patient management decisions.  A negative result may occur with improper specimen collection / handling, submission of specimen other than nasopharyngeal swab, presence of viral mutation(s) within the areas targeted by this assay, and inadequate number of viral copies (<250 copies / mL). A negative result must be combined with clinical observations, patient history, and epidemiological information.  Fact Sheet for Patients:   StrictlyIdeas.no  Fact Sheet for Healthcare Providers: BankingDealers.co.za  This test is not yet approved or  cleared by the [place] FDA and has been authorized for detection and/or diagnosis of SARS-CoV-2 by FDA under an Emergency Use Authorization (EUA).  This EUA will remain in effect (meaning this test can be used) for the duration of the COVID-19 declaration under Section 564(b)(1) of the Act, 21 U.S.C. section 360bbb-3(b)(1), unless the authorization is terminated or revoked sooner.  Performed at [facility], [address], [place], [place] [address]          Radiology Studies: CT CHEST ABDOMEN PELVIS W CONTRAST  Result Date: 02/06/2020 CLINICAL DATA:  Hyponatremia, anemia, and lower extremity DVT. Evaluation for underlying malignancy. EXAM: CT CHEST, ABDOMEN, AND PELVIS WITH CONTRAST TECHNIQUE: Multidetector CT imaging of the chest, abdomen and pelvis was performed following the standard protocol during bolus  administration of intravenous contrast. CONTRAST:  16mL OMNIPAQUE IOHEXOL 300 MG/ML  SOLN COMPARISON:  CTA chest 12/23/2019. CT abdomen and pelvis 12/14/2019. FINDINGS: CT CHEST FINDINGS Cardiovascular: Mild thoracic aortic atherosclerosis without aneurysm. Normal heart size. No pericardial effusion. Mediastinum/Nodes: 1 cm low-density left thyroid nodule, not considered clinically significant and for which no follow-up imaging is recommended. No enlarged axillary, mediastinal, or hilar lymph nodes. Unremarkable esophagus. Lungs/Pleura: No pleural effusion or pneumothorax. Mild paraseptal emphysema. Unchanged 3 mm right lower lobe nodule along the major fissure (series 4, image 78). Unchanged 2 mm nodule along the right minor fissure (series 4, image 86). Unchanged 2 mm subpleural nodule in the right middle lobe (series 4, image 91). Unchanged 3 mm subpleural nodule in the right middle lobe (series 4, image 111). Unchanged 3 mm left upper lobe nodule (series 4, image 79). No mass. Minimal atelectasis or scarring in the lung bases. Musculoskeletal: No acute osseous abnormality or suspicious osseous lesion. CT ABDOMEN PELVIS FINDINGS Hepatobiliary: Several subcentimeter low-density lesions in the liver measuring up to 5 mm, too small to fully characterize. No significant biliary dilatation status post cholecystectomy. Pancreas: Unremarkable. Spleen: Unremarkable. Adrenals/Urinary Tract: Unremarkable adrenal glands. 2 punctate unchanged right renal calculi. Poor visualization of the single punctate left renal calculus shown on the prior CT. No hydronephrosis. Unchanged 1.6 cm left lower pole renal cyst. Additional subcentimeter hypodensities in both kidneys, too small to fully characterize. Unremarkable bladder. Stomach/Bowel: The stomach is unremarkable. There is a large amount of stool in the rectum which is distended to 7 cm in diameter with new moderate presacral soft tissue thickening/stranding. A large amount of  right-sided colonic stool is also present. There is no evidence of bowel obstruction. The appendix is not clearly identified, however there is no Peri cecal inflammation. Vascular/Lymphatic: Aortic atherosclerosis. Unchanged ectasia of the infrarenal abdominal aorta with maximal diameter of 2.7 cm. Partial imaging of left femoral DVT, more fully evaluated on 02/02/2020 ultrasound. No enlarged lymph nodes. Reproductive: Uterus and bilateral adnexa are unremarkable. Other: No ascites or pneumoperitoneum. Partially visualized left thigh edema in the setting of the known extensive DVT. Musculoskeletal: No acute osseous abnormality or suspicious osseous lesion. IMPRESSION: 1. No definite evidence of  malignancy in the chest, abdomen, or pelvis. 2. Unchanged small bilateral lung nodules measuring up to 3 mm. Non-contrast chest CT can be considered in 12 months. This recommendation follows the consensus statement: Guidelines for Management of Incidental Pulmonary Nodules Detected on CT Images: From the Fleischner Society 2017; Radiology 2017; 284:228-243. 3. Large stool burden with rectal distension and presacral inflammation which may reflect stercoral proctitis. 4. Known left lower extremity DVT. 5. Nonobstructing nephrolithiasis. 6. Aortic Atherosclerosis (ICD10-I70.0) and Emphysema (ICD10-J43.9). Electronically Signed   By: Logan Bores M.D.   On: 02/06/2020 13:05        Scheduled Meds: . sodium chloride   Intravenous Once  . amLODipine  10 mg Oral Daily  . apixaban  10 mg Oral BID   Followed by  . [START ON 02/12/2020] apixaban  5 mg Oral BID  . Chlorhexidine Gluconate Cloth  6 each Topical Daily  . cyanocobalamin  1,000 mcg Intramuscular Q30 days  . dorzolamide  1 drop Both Eyes BID  . feeding supplement (ENSURE ENLIVE)  237 mL Oral BID BM  . folic acid  1 mg Oral Daily  . gabapentin  100 mg Oral QHS  . latanoprost  1 drop Both Eyes QHS  . metoCLOPramide  5 mg Oral TID AC  . nutrition supplement  (JUVEN)  1 packet Oral BID BM  . pantoprazole  40 mg Oral Daily  . polyethylene glycol  17 g Oral Daily   Continuous Infusions: . sodium chloride 50 mL/hr at 02/07/20 1539     LOS: 6 days   Roxan Hockey, MD [facility]   If 7PM-7AM, please contact night-coverage [email or web address]  02/07/2020, 7:10 PM

## 2020-02-07 NOTE — TOC Progression Note (Signed)
Transition of Care [facility]) - Progression Note    Patient Details  Name: Traci Parker MRN: [id number] Date of Birth: Jan 23, 1946  [facility] CM/SW Contact  Salome Arnt, [place] Phone Number: 02/07/2020, 2:46 PM  Clinical Narrative:   LCSW provided bed offers to pt and pt's son this morning and they have decided to return to [facility]. Tami at [facility] notified and started authorization. LCSW notified MD of need for COVID test. Awaiting authorization to return to SNF.     Expected Discharge Plan: Skilled Nursing Facility Barriers to Discharge: Continued Medical Work up  Expected Discharge Plan and Services Expected Discharge Plan: [place]         Expected Discharge Date: 02/07/20                                     Social Determinants of Health (SDOH) Interventions    Readmission Risk Interventions Readmission Risk Prevention Plan 12/25/2019  Transportation Screening Complete  Home Care Screening Not Complete  Home Care Screening Not Completed Comments SNF referral  Medication Review (RN CM) Complete  Some recent data might be hidden

## 2020-02-07 NOTE — Care Management Important Message (Signed)
Important Message  Patient Details  Name: Traci Parker MRN: [id number] Date of Birth: 08/11/1945   Medicare Important Message Given:  Yes     Tommy Medal 02/07/2020, 2:06 PM

## 2020-02-08 ENCOUNTER — Telehealth: Payer: Self-pay | Admitting: *Deleted

## 2020-02-08 DIAGNOSIS — J449 Chronic obstructive pulmonary disease, unspecified: Secondary | ICD-10-CM | POA: Diagnosis not present

## 2020-02-08 DIAGNOSIS — I824Y2 Acute embolism and thrombosis of unspecified deep veins of left proximal lower extremity: Secondary | ICD-10-CM | POA: Diagnosis not present

## 2020-02-08 LAB — BASIC METABOLIC PANEL
Anion gap: 8 (ref 5–15)
BUN: 13 mg/dL (ref 8–23)
CO2: 21 mmol/L — ABNORMAL LOW (ref 22–32)
Calcium: 8.6 mg/dL — ABNORMAL LOW (ref 8.9–10.3)
Chloride: 105 mmol/L (ref 98–111)
Creatinine, Ser: 0.91 mg/dL (ref 0.44–1.00)
GFR calc Af Amer: >60 mL/min (ref >60)
GFR calc non Af Amer: >60 mL/min (ref >60)
Glucose, Bld: 85 mg/dL (ref 70–99)
Potassium: 3.6 mmol/L (ref 3.5–5.1)
Sodium: 134 mmol/L — ABNORMAL LOW (ref 135–145)

## 2020-02-08 NOTE — Telephone Encounter (Addendum)
Called daughter, Rogelia Boga and relayed Dr [employer] message to her. She was with her mother, had phone on speaker. They agreed that she will cancel NCS for now, focus on PT. Daughter verbalized understanding, appreciation. EMG/NCS canceled.

## 2020-02-08 NOTE — Progress Notes (Signed)
PROGRESS NOTE    Traci Parker  GYF:[id number] DOB: 1945-11-05 DOA: 02/01/2020 PCP: Doree Albee, MD    Brief Narrative:  74 year old female with a history of anemia, hypertension who was recently admitted to the skilled nursing facility for generalized weakness, ataxia and difficulty with ambulation.  She was sent to the emergency room after she was found to be hyponatremic on labs.  She was admitted for IV fluids.  Further work-up indicated that she has left lower extremity DVT.  She is been started on anticoagulation. ---- Awaiting insurance approval to return to SNF  Assessment & Plan:   Active Problems:   Hyperlipidemia   Hypertension   Failure to thrive in adult   COPD (chronic obstructive pulmonary disease) (HCC)   CKD (chronic kidney disease)   Anemia due to chronic renal failure treated with erythropoietin, stage 3 (moderate)   Hyponatremia   Malnutrition of moderate degree   Pressure injury of skin   Acute deep vein thrombosis (DVT) of left lower extremity ([place])   1)Hyponatremia--Likely related to hypovolemia from poor p.o. intake.  -Improved with IV fluids, sodium is up to 134 --- her p.o. intake has been poor since she is unable to eat due to lack of teeth.  Diet changed to pured diet.--  2)Anemia---  Patient has anemia of chronic disease as well as B12 deficiency.  She has been receiving ESA as an outpatient.  Hemoglobin on admission trended down to 7.7 with IV hydration.  She received 1 unit PRBC with improvement of hemoglobin to 9.5.  Continue to monitor.  3)Left lower extremity DVT---  Patient was non-ambulatory and taking Megace as an outpatient.  She was found to have extensive DVT in the left lower extremity.  Started on intravenous heparin.  Discontinue Megace.  Case reviewed with Dr. Carlis Abbott on-call for vascular surgery.  Due to her comorbidities, poor functional status, and lack of significant lower extremity symptoms, she was not felt to be in need of a  thrombectomy at this time. Recommendations were for continued anticoagulation.  She has been transitioned to Eliquis.  With concurrent hyponatremia, failure to thrive and extensive DVT, CT chest abd/pelvis was checked to evaluate for underlying malignancy. Fortunately, no obvious malignancy noted on imaging.  4)Failure to thrive/generalized weakness/ataxia.---She was at nursing facility for ataxia and difficulty with ambulation. I discussed her care with her primary neurologist, Dr. Leta Baptist who requested that MRI C-spine and MRI T-spine be obtained in her hospitalization due to significant barriers in obtaining this as an outpatient.  MRI T-spine performed was read as a normal study.  MRI C-spine shows degenerative changes with cervical spinal stenosis with mass-effect on spinal cord.  Case was reviewed with Dr. Newman Pies on-call for neurosurgery.  Due to the chronic nature of her symptoms, as well as her age and comorbidities, she would be a high risk surgical candidate.  It was not felt that surgical intervention was urgently needed at this time.  Recommendations were to continue with physical therapy and follow-up with neurosurgery as an outpatient. Steroids were not recommended at this time.   5)COPD.  No shortness of breath or wheezing at this time.  6)HTN--- Stable on Norvasc.  7)Malnutrition of moderate degree.  Nutrition following.  Pressure injury: Pressure Injury 02/01/20 Sacrum Stage 2 -  Partial thickness loss of dermis presenting as a shallow open injury with a red, pink wound bed without slough. (Active)  02/01/20 2033  Location: Sacrum  Location Orientation:   Staging: Stage 2 -  Partial thickness loss of dermis presenting as a shallow open injury with a red, pink wound bed without slough.  Wound Description (Comments):   Present on Admission: Yes  -Continue supportive measures    DVT prophylaxis: Eliquis Code Status: Full code Family Communication: Discussed with her  daughter over the phone previously Disposition Plan: Status is: Inpatient  Remains inpatient appropriate because:Inpatient level of care appropriate due to severity of illness   Dispo: The patient is from: SNF              Anticipated d/c is to: SNF              Anticipated d/c date is: 1 day              Patient currently is medically stable to d/c. --- Awaiting insurance approval to return to SNF   Consultants:   Phone consult with neurosurgeon Dr. Dellis Filbert  Procedures:     Antimicrobials:       Subjective: No shortness of breath.  --- Awaiting insurance approval to return to SNF -No new complaints  Objective: Vitals:   02/07/20 1742 02/07/20 1930 02/07/20 2104 02/08/20 0451  BP: 113/76  (!) 119/63 123/69  Pulse: 96  96 99  Resp: 18  20 20   Temp: 99.7 F (37.6 C)  99 F (37.2 C) 99 F (37.2 C)  TempSrc: Oral  Oral Oral  SpO2: 100% 98% 100% 98%  Weight:      Height:        Intake/Output Summary (Last 24 hours) at 02/08/2020 1820 Last data filed at 02/08/2020 1200 Gross per 24 hour  Intake 480 ml  Output 1150 ml  Net -670 ml   Filed Weights   02/01/20 1644 02/01/20 2135  Weight: 50.3 kg 53.7 kg    Examination:  General exam: Alert, awake, oriented x 3 Respiratory system: Clear to auscultation. Respiratory effort normal. Cardiovascular system:RRR. No murmurs, rubs, gallops. Gastrointestinal system: Abdomen is nondistended, soft and nontender. No organomegaly or masses felt. Normal bowel sounds heard. Central nervous system: Alert and oriented. No focal neurological deficits. Extremities: 1+ edema in LLE Skin: No rashes, lesions or ulcers Psychiatry: Judgement and insight appear normal. Mood & affect appropriate.     Data Reviewed:  CBC: Recent Labs  Lab 02/03/20 0716 02/04/20 0624 02/05/20 0512 02/06/20 0431 02/07/20 0417  WBC 4.8 4.7 5.0 5.3 7.1  HGB 9.5* 9.2* 9.4* 10.0* 9.9*  HCT 28.7* 28.7* 29.2* 31.6* 31.6*  MCV 76.5* 78.2* 78.3*  79.8* 79.4*  PLT 236 273 270 315 370   Basic Metabolic Panel: Recent Labs  Lab 02/02/20 0423 02/03/20 0716 02/05/20 0512 02/06/20 0431 02/08/20 0421  NA 124* 128* 132* 133* 134*  K 3.9 3.5 3.5 3.8 3.6  CL 94* 97* 104 105 105  CO2 21* 21* 19* 19* 21*  GLUCOSE 101* 97 95 89 85  BUN 22 16 19 17 13   CREATININE 1.09* 0.92 0.90 0.86 0.91  CALCIUM 8.5* 8.5* 8.6* 8.6* 8.6*   GFR: Estimated Creatinine Clearance: 45.3 mL/min (by C-G formula based on SCr of 0.91 mg/dL). Liver Function Tests: Recent Labs  Lab 02/05/20 0512  AST 9*  ALT 10  ALKPHOS 48  BILITOT 0.5  PROT 5.2*  ALBUMIN 2.6*   No results for input(s): LIPASE, AMYLASE in the last 168 hours. No results for input(s): AMMONIA in the last 168 hours. Coagulation Profile: No results for input(s): INR, PROTIME in the last 168 hours. Cardiac Enzymes: No results for input(s):  CKTOTAL, CKMB, CKMBINDEX, TROPONINI in the last 168 hours. BNP (last 3 results) No results for input(s): PROBNP in the last 8760 hours. HbA1C: No results for input(s): HGBA1C in the last 72 hours. CBG: No results for input(s): GLUCAP in the last 168 hours. Lipid Profile: No results for input(s): CHOL, HDL, LDLCALC, TRIG, CHOLHDL, LDLDIRECT in the last 72 hours. Thyroid Function Tests: No results for input(s): TSH, T4TOTAL, FREET4, T3FREE, THYROIDAB in the last 72 hours. Anemia Panel: No results for input(s): VITAMINB12, FOLATE, FERRITIN, TIBC, IRON, RETICCTPCT in the last 72 hours. Sepsis Labs: No results for input(s): PROCALCITON, LATICACIDVEN in the last 168 hours.  Recent Results (from the past 240 hour(s))  SARS Coronavirus 2 by RT PCR (hospital order, performed in [facility] hospital lab) Nasopharyngeal Nasopharyngeal Swab     Status: None   Collection Time: 02/01/20  6:42 PM   Specimen: Nasopharyngeal Swab  Result Value Ref Range Status   SARS Coronavirus 2 NEGATIVE NEGATIVE Final    Comment: (NOTE) SARS-CoV-2 target nucleic acids are  NOT DETECTED.  The SARS-CoV-2 RNA is generally detectable in upper and lower respiratory specimens during the acute phase of infection. The lowest concentration of SARS-CoV-2 viral copies this assay can detect is 250 copies / mL. A negative result does not preclude SARS-CoV-2 infection and should not be used as the sole basis for treatment or other patient management decisions.  A negative result may occur with improper specimen collection / handling, submission of specimen other than nasopharyngeal swab, presence of viral mutation(s) within the areas targeted by this assay, and inadequate number of viral copies (<250 copies / mL). A negative result must be combined with clinical observations, patient history, and epidemiological information.  Fact Sheet for Patients:   StrictlyIdeas.no  Fact Sheet for Healthcare Providers: BankingDealers.co.za  This test is not yet approved or  cleared by the [place] FDA and has been authorized for detection and/or diagnosis of SARS-CoV-2 by FDA under an Emergency Use Authorization (EUA).  This EUA will remain in effect (meaning this test can be used) for the duration of the COVID-19 declaration under Section 564(b)(1) of the Act, 21 U.S.C. section 360bbb-3(b)(1), unless the authorization is terminated or revoked sooner.  Performed at [facility], [address] [place], [place] [address]   SARS Coronavirus 2 by RT PCR (hospital order, performed in [facility] hospital lab) Nasopharyngeal Nasopharyngeal Swab     Status: None   Collection Time: 02/07/20  3:40 PM   Specimen: Nasopharyngeal Swab  Result Value Ref Range Status   SARS Coronavirus 2 NEGATIVE NEGATIVE Final    Comment: (NOTE) SARS-CoV-2 target nucleic acids are NOT DETECTED.  The SARS-CoV-2 RNA is generally detectable in upper and lower respiratory specimens during the acute phase of infection. The lowest concentration of  SARS-CoV-2 viral copies this assay can detect is 250 copies / mL. A negative result does not preclude SARS-CoV-2 infection and should not be used as the sole basis for treatment or other patient management decisions.  A negative result may occur with improper specimen collection / handling, submission of specimen other than nasopharyngeal swab, presence of viral mutation(s) within the areas targeted by this assay, and inadequate number of viral copies (<250 copies / mL). A negative result must be combined with clinical observations, patient history, and epidemiological information.  Fact Sheet for Patients:   StrictlyIdeas.no  Fact Sheet for Healthcare Providers: BankingDealers.co.za  This test is not yet approved or  cleared by the [place] FDA  and has been authorized for detection and/or diagnosis of SARS-CoV-2 by FDA under an Emergency Use Authorization (EUA).  This EUA will remain in effect (meaning this test can be used) for the duration of the COVID-19 declaration under Section 564(b)(1) of the Act, 21 U.S.C. section 360bbb-3(b)(1), unless the authorization is terminated or revoked sooner.  Performed at [facility], [address], [place], [place] [address]          Radiology Studies: No results found.      Scheduled Meds: . sodium chloride   Intravenous Once  . amLODipine  10 mg Oral Daily  . apixaban  10 mg Oral BID   Followed by  . [START ON 02/12/2020] apixaban  5 mg Oral BID  . Chlorhexidine Gluconate Cloth  6 each Topical Daily  . cyanocobalamin  1,000 mcg Intramuscular Q30 days  . dorzolamide  1 drop Both Eyes BID  . feeding supplement (ENSURE ENLIVE)  237 mL Oral BID BM  . folic acid  1 mg Oral Daily  . gabapentin  100 mg Oral QHS  . latanoprost  1 drop Both Eyes QHS  . metoCLOPramide  5 mg Oral TID AC  . nutrition supplement (JUVEN)  1 packet Oral BID BM  . pantoprazole  40 mg Oral Daily  .  polyethylene glycol  17 g Oral Daily   Continuous Infusions: . sodium chloride 50 mL/hr at 02/07/20 1539     LOS: 7 days   Roxan Hockey, MD [facility]   If 7PM-7AM, please contact night-coverage [email or web address]  02/08/2020, 6:20 PM

## 2020-02-08 NOTE — Telephone Encounter (Signed)
Per hospitalist notes:  "MRI C-spine shows degenerative changes with cervical spinal stenosis with mass-effect on spinal cord.  Case was reviewed with Dr. Newman Pies on-call for neurosurgery.  Due to the chronic nature of her symptoms, as well as her age and comorbidities, she would be a high risk surgical candidate.  It was not felt that surgical intervention was urgently needed at this time.  Recommendations were to continue with physical therapy and follow-up with neurosurgery as an outpatient. Steroids were not recommended at this time."  Patient's weakness likely related to MRI c-spine findings, but surgery mgmt is high risk per Neurosurgery. She could consider follow up as outpatient with neurosurgery. Weakness also related to failure to thrive / weakness / decreased PO intake. Ok to hold off on EMG testing.   Penni Bombard, MD 0/22/3361, 22:44 PM Certified in Neurology, Neurophysiology and Neuroimaging  [facility] [address], [place], [place] [address] [phone]

## 2020-02-08 NOTE — TOC Progression Note (Addendum)
Transition of Care [facility]) - Progression Note    Patient Details  Name: Traci Parker MRN: [id number] Date of Birth: 04/29/1946  Transition of Care [facility]) CM/SW Contact  Salome Arnt, [place] Phone Number: 02/08/2020, 2:51 PM  Clinical Narrative:  Per Sondra Barges at [facility], authorization still pending. Insurance requested updated PT notes and Tami sent notes from 7/27. LCSW requested PT to see pt today. LCSW updated pt and son. Will follow.     Expected Discharge Plan: Skilled Nursing Facility Barriers to Discharge: Continued Medical Work up  Expected Discharge Plan and Services Expected Discharge Plan: [place]         Expected Discharge Date: 02/08/20                                     Social Determinants of Health (SDOH) Interventions    Readmission Risk Interventions Readmission Risk Prevention Plan 12/25/2019  Transportation Screening Complete  Home Care Screening Not Complete  Home Care Screening Not Completed Comments SNF referral  Medication Review (RN CM) Complete  Some recent data might be hidden

## 2020-02-08 NOTE — Progress Notes (Signed)
Physical Therapy Treatment Patient Details Name: Traci Parker MRN: [id number] DOB: [birth year] Today's Date: 02/08/2020    History of Present Illness Traci Parker  is a 74 y.o. female,  with medical history significant of seasonal allergies, pernicious anemia, asthma/COPD, history of blood transfusion, glaucoma, history of hematuria, hypertension, osteoporosis, type 2 diabetes with recent hospitalization due to pernicious anemia and failure to thrive, patient was sent from [facility] for hyponatremia, sodium sodium level was 120 on blood draw this morning at the facility, patient herself denies any complaint, reports she has been feeling at baseline, patient reports at baseline she has poor appetite as she does not like her dysphagia to diet given she has no teeth or dentures, denies any nausea, vomiting, diarrhea, no fever, no chills, no chest pain or shortness of breath, and by reviewing SNF records does not appear she was started on any new medications which would contribute to her hyponatremia.    PT Comments    Patient has difficulty for supine to sitting due to poor strength bilateral hands, demonstrated fair/good sitting balance after verbal cues/demonstrationg to lean forward when completing BLE ROM/strengthening exercises without losing balance, increased BLE strength for completing sit to stands, but poor carryover for holding onto RW with hands due to weakness and limited to a few unsteady labored side steps to transfer to chair.  Patient tolerated sitting up in chair after therapy - nursing staff notified.  Patient will benefit from continued physical therapy in hospital and recommended venue below to increase strength, balance, endurance for safe ADLs and gait.    Follow Up Recommendations  SNF     Equipment Recommendations  None recommended by PT    Recommendations for Other Services       Precautions / Restrictions Precautions Precautions: Fall Restrictions Weight  Bearing Restrictions: No    Mobility  Bed Mobility Overal bed mobility: Needs Assistance Bed Mobility: Supine to Sit     Supine to sit: Mod assist     General bed mobility comments: slow labored movement with limited use of BUE due to weakness  Transfers Overall transfer level: Needs assistance Equipment used: Rolling walker (2 wheeled) Transfers: Sit to/from [facility] Sit to Stand: Mod assist;Max assist Stand pivot transfers: Max assist       General transfer comment: had diffiuclty holding onto RW, due poor grip strength, increased BLE strength for standing  Ambulation/Gait Ambulation/Gait assistance: Max assist Gait Distance (Feet): 3 Feet Assistive device: Rolling walker (2 wheeled) Gait Pattern/deviations: Decreased step length - right;Decreased step length - left;Decreased stride length Gait velocity: slow   General Gait Details: limited to 2-3 slow unsteady labored side steps with frequent verbal/tactile to keep patient's hands holding onto RW   Stairs             Wheelchair Mobility    Modified Rankin (Stroke Patients Only)       Balance Overall balance assessment: Needs assistance Sitting-balance support: Feet supported;No upper extremity supported Sitting balance-Leahy Scale: Fair Sitting balance - Comments: seated at EOB Postural control: Posterior lean Standing balance support: During functional activity;Bilateral upper extremity supported Standing balance-Leahy Scale: Poor Standing balance comment: using RW                            Cognition Arousal/Alertness: Awake/alert Behavior During Therapy: WFL for tasks assessed/performed Overall Cognitive Status: Within Functional Limits for tasks assessed  Exercises General Exercises - Lower Extremity Long Arc Quad: Seated;AROM;Strengthening;Both;10 reps Hip Flexion/Marching:  Seated;AROM;Strengthening;Both Toe Raises: Seated;AROM;Strengthening;Both Heel Raises: Seated;AROM;Strengthening;Both    General Comments        Pertinent Vitals/Pain Pain Assessment: No/denies pain    Home Living                      Prior Function            PT Goals (current goals can now be found in the care plan section) Acute Rehab PT Goals Patient Stated Goal: home after rehab PT Goal Formulation: With patient Time For Goal Achievement: 02/16/20 Potential to Achieve Goals: Good Progress towards PT goals: Progressing toward goals    Frequency    Min 3X/week      PT Plan Current plan remains appropriate    Co-evaluation              AM-PAC PT "6 Clicks" Mobility   Outcome Measure  Help needed turning from your back to your side while in a flat bed without using bedrails?: A Lot Help needed moving from lying on your back to sitting on the side of a flat bed without using bedrails?: A Lot Help needed moving to and from a bed to a chair (including a wheelchair)?: A Lot Help needed standing up from a chair using your arms (e.g., wheelchair or bedside chair)?: A Lot Help needed to walk in hospital room?: Total Help needed climbing 3-5 steps with a railing? : Total 6 Click Score: 10    End of Session   Activity Tolerance: Patient tolerated treatment well;Patient limited by fatigue Patient left: in chair;with call bell/phone within reach Nurse Communication: Mobility status PT Visit Diagnosis: Unsteadiness on feet (R26.81);Other abnormalities of gait and mobility (R26.89);Muscle weakness (generalized) (M62.81);History of falling (Z91.81)     Time: 3016-0109 PT Time Calculation (min) (ACUTE ONLY): 26 min  Charges:  $Therapeutic Exercise: 8-22 mins $Therapeutic Activity: 8-22 mins                     3:17 PM, 02/08/20 Lonell Grandchild, MPT Physical Therapist with [facility] 336 [phone] office [phone] mobile phone

## 2020-02-08 NOTE — Telephone Encounter (Signed)
Received my chart: Hi Dr. Leta Baptist after speaking with Dr. Ulice Bold about my mom's MRI results I had concerns about what steps to take next as far as treatments or possible surgery. As well as [facility] on the 4th floor that I was hoping she could transfer to from the hospital where she currently is. Called daughter who stated patient was admitted to hospital on 02/04/20 due to blood clot and low sodium. She remains in hospital. MRI cervical and thoracic spine done. Daughter asking if results of cervical spine are causing mother's leg weakness. She is concerned that this will not be addressed and mother will be d/c back to same SNF. I advised her mother has been seen by case manager who will help navigate process. Admitting MD will need to decide what type care she needs prior to discharge. Daughter asking about upcoming NCS, stated she wants mother to have it here despite difficulty getting her here. I advised will let Dr Leta Baptist know of her questions, concerns, call her back. She verbalized understanding, appreciation.

## 2020-02-09 ENCOUNTER — Inpatient Hospital Stay
Admission: RE | Admit: 2020-02-09 | Discharge: 2020-11-29 | Disposition: A | Discharge status: Nursing Home (Includes ICF) | Payer: Medicare Other | Source: Ambulatory Visit | Attending: Internal Medicine | Admitting: Internal Medicine

## 2020-02-09 DIAGNOSIS — R000 Tachycardia, unspecified: Secondary | ICD-10-CM | POA: Diagnosis not present

## 2020-02-09 DIAGNOSIS — R1310 Dysphagia, unspecified: Secondary | ICD-10-CM | POA: Diagnosis not present

## 2020-02-09 DIAGNOSIS — D509 Iron deficiency anemia, unspecified: Secondary | ICD-10-CM | POA: Diagnosis not present

## 2020-02-09 DIAGNOSIS — K2950 Unspecified chronic gastritis without bleeding: Secondary | ICD-10-CM | POA: Diagnosis not present

## 2020-02-09 DIAGNOSIS — D631 Anemia in chronic kidney disease: Secondary | ICD-10-CM | POA: Diagnosis not present

## 2020-02-09 DIAGNOSIS — R627 Adult failure to thrive: Secondary | ICD-10-CM | POA: Diagnosis not present

## 2020-02-09 DIAGNOSIS — M4802 Spinal stenosis, cervical region: Secondary | ICD-10-CM | POA: Diagnosis not present

## 2020-02-09 DIAGNOSIS — Z741 Need for assistance with personal care: Secondary | ICD-10-CM | POA: Diagnosis not present

## 2020-02-09 DIAGNOSIS — N183 Chronic kidney disease, stage 3 unspecified: Secondary | ICD-10-CM | POA: Diagnosis not present

## 2020-02-09 DIAGNOSIS — I1310 Hypertensive heart and chronic kidney disease without heart failure, with stage 1 through stage 4 chronic kidney disease, or unspecified chronic kidney disease: Secondary | ICD-10-CM | POA: Diagnosis not present

## 2020-02-09 DIAGNOSIS — R633 Feeding difficulties: Secondary | ICD-10-CM | POA: Diagnosis not present

## 2020-02-09 DIAGNOSIS — N1832 Chronic kidney disease, stage 3b: Secondary | ICD-10-CM | POA: Diagnosis not present

## 2020-02-09 DIAGNOSIS — E1122 Type 2 diabetes mellitus with diabetic chronic kidney disease: Secondary | ICD-10-CM | POA: Diagnosis not present

## 2020-02-09 DIAGNOSIS — K2970 Gastritis, unspecified, without bleeding: Secondary | ICD-10-CM | POA: Diagnosis not present

## 2020-02-09 DIAGNOSIS — E1121 Type 2 diabetes mellitus with diabetic nephropathy: Secondary | ICD-10-CM | POA: Diagnosis not present

## 2020-02-09 DIAGNOSIS — R279 Unspecified lack of coordination: Secondary | ICD-10-CM | POA: Diagnosis not present

## 2020-02-09 DIAGNOSIS — E440 Moderate protein-calorie malnutrition: Secondary | ICD-10-CM | POA: Diagnosis not present

## 2020-02-09 DIAGNOSIS — D510 Vitamin B12 deficiency anemia due to intrinsic factor deficiency: Secondary | ICD-10-CM | POA: Diagnosis not present

## 2020-02-09 DIAGNOSIS — E876 Hypokalemia: Secondary | ICD-10-CM | POA: Diagnosis not present

## 2020-02-09 DIAGNOSIS — H4010X0 Unspecified open-angle glaucoma, stage unspecified: Secondary | ICD-10-CM | POA: Diagnosis not present

## 2020-02-09 DIAGNOSIS — E871 Hypo-osmolality and hyponatremia: Secondary | ICD-10-CM | POA: Diagnosis not present

## 2020-02-09 DIAGNOSIS — R911 Solitary pulmonary nodule: Secondary | ICD-10-CM | POA: Diagnosis not present

## 2020-02-09 DIAGNOSIS — M6281 Muscle weakness (generalized): Secondary | ICD-10-CM | POA: Diagnosis not present

## 2020-02-09 DIAGNOSIS — G6289 Other specified polyneuropathies: Secondary | ICD-10-CM | POA: Diagnosis not present

## 2020-02-09 DIAGNOSIS — N390 Urinary tract infection, site not specified: Secondary | ICD-10-CM | POA: Diagnosis not present

## 2020-02-09 DIAGNOSIS — R262 Difficulty in walking, not elsewhere classified: Secondary | ICD-10-CM | POA: Diagnosis not present

## 2020-02-09 DIAGNOSIS — E860 Dehydration: Secondary | ICD-10-CM | POA: Diagnosis not present

## 2020-02-09 DIAGNOSIS — J449 Chronic obstructive pulmonary disease, unspecified: Secondary | ICD-10-CM | POA: Diagnosis not present

## 2020-02-09 DIAGNOSIS — E1129 Type 2 diabetes mellitus with other diabetic kidney complication: Secondary | ICD-10-CM | POA: Diagnosis not present

## 2020-02-09 DIAGNOSIS — E785 Hyperlipidemia, unspecified: Secondary | ICD-10-CM | POA: Diagnosis not present

## 2020-02-09 DIAGNOSIS — I82402 Acute embolism and thrombosis of unspecified deep veins of left lower extremity: Secondary | ICD-10-CM | POA: Diagnosis not present

## 2020-02-09 DIAGNOSIS — I824Y2 Acute embolism and thrombosis of unspecified deep veins of left proximal lower extremity: Secondary | ICD-10-CM | POA: Diagnosis not present

## 2020-02-09 DIAGNOSIS — R112 Nausea with vomiting, unspecified: Secondary | ICD-10-CM | POA: Diagnosis not present

## 2020-02-09 DIAGNOSIS — R270 Ataxia, unspecified: Secondary | ICD-10-CM | POA: Diagnosis not present

## 2020-02-09 LAB — MRSA PCR SCREENING: MRSA by PCR: NEGATIVE

## 2020-02-09 LAB — SARS CORONAVIRUS 2 BY RT PCR (HOSPITAL ORDER, PERFORMED IN ~~LOC~~ HOSPITAL LAB): SARS Coronavirus 2: NEGATIVE

## 2020-02-09 MED ORDER — MIRTAZAPINE 15 MG PO TABS
7.5000 mg | ORAL_TABLET | Freq: Every day | ORAL | 2 refills | Status: DC
Start: 2020-02-09 — End: 2020-03-13

## 2020-02-09 MED ORDER — ALPRAZOLAM 0.25 MG PO TABS: 0.2500 mg | ORAL_TABLET | Freq: Two times a day (BID) | ORAL | 0 refills | Status: DC | PRN

## 2020-02-09 MED ORDER — APIXABAN 5 MG PO TABS
5.0000 mg | ORAL_TABLET | Freq: Two times a day (BID) | ORAL | 5 refills | Status: DC
Start: 2020-02-09 — End: 2021-01-29

## 2020-02-09 NOTE — Progress Notes (Signed)
°-  Peer-to-peer completed with insurance physician Dr. Jonni Sanger -- -- --awaiting final decision on approval for SNF rehab  Traci Amason Denton Brick, MD

## 2020-02-09 NOTE — TOC Progression Note (Signed)
Transition of Care [facility]) - Progression Note    Patient Details  Name: Traci Parker MRN: [id number] Date of Birth: 1945-10-26  [name] CM/SW Contact  Salome Arnt, [place] Phone Number: 02/09/2020, 11:12 AM  Clinical Narrative:  Per Sondra Barges at [facility], insurance requesting peer-to-peer for SNF to be completed by 3:30 today. MD provided with number to complete. Will follow.        Expected Discharge Plan: Skilled Nursing Facility Barriers to Discharge: Insurance Authorization  Expected Discharge Plan and Services Expected Discharge Plan: [place]         Expected Discharge Date: 02/09/20                                     Social Determinants of Health (SDOH) Interventions    Readmission Risk Interventions Readmission Risk Prevention Plan 12/25/2019  Transportation Screening Complete  Home Care Screening Not Complete  Home Care Screening Not Completed Comments SNF referral  Medication Review (RN CM) Complete  Some recent data might be hidden

## 2020-02-09 NOTE — Discharge Instructions (Signed)
--  1)You are taking Eliquis/apixaban which is your blood thinner so please Avoid ibuprofen/Advil/Aleve/Motrin/Goody Powders/Naproxen/BC powders/Meloxicam/Diclofenac/Indomethacin and other Nonsteroidal anti-inflammatory medications as these will make you more likely to bleed and can cause stomach ulcers, can also cause Kidney problems.   CBC and BMP every Friday starting 02/16/2020 for the next 3 weeks    Information on my medicine - ELIQUIS (apixaban)  This medication education was reviewed with me or my healthcare representative as part of my discharge preparation.  The pharmacist that spoke with me during my hospital stay was:  Ramond Craver, [facility]  Why was Eliquis prescribed for you? Eliquis was prescribed to treat blood clots that may have been found in the veins of your legs (deep vein thrombosis) or in your lungs (pulmonary embolism) and to reduce the risk of them occurring again.  What do You need to know about Eliquis ? The starting dose is 10 mg (two 5 mg tablets) taken TWICE daily for the FIRST SEVEN (7) DAYS, then on (enter date)  02/12/2020 @ 10 PM  the dose is reduced to ONE 5 mg tablet taken TWICE daily.  Eliquis may be taken with or without food.   Try to take the dose about the same time in the morning and in the evening. If you have difficulty swallowing the tablet whole please discuss with your pharmacist how to take the medication safely.  Take Eliquis exactly as prescribed and DO NOT stop taking Eliquis without talking to the doctor who prescribed the medication.  Stopping may increase your risk of developing a new blood clot.  Refill your prescription before you run out.  After discharge, you should have regular check-up appointments with your healthcare provider that is prescribing your Eliquis.    What do you do if you miss a dose? If a dose of ELIQUIS is not taken at the scheduled time, take it as soon as possible on the same day and twice-daily administration  should be resumed. The dose should not be doubled to make up for a missed dose.  Important Safety Information A possible side effect of Eliquis is bleeding. You should call your healthcare provider right away if you experience any of the following: ? Bleeding from an injury or your nose that does not stop. ? Unusual colored urine (red or dark brown) or unusual colored stools (red or black). ? Unusual bruising for unknown reasons. ? A serious fall or if you hit your head (even if there is no bleeding).  Some medicines may interact with Eliquis and might increase your risk of bleeding or clotting while on Eliquis. To help avoid this, consult your healthcare provider or pharmacist prior to using any new prescription or non-prescription medications, including herbals, vitamins, non-steroidal anti-inflammatory drugs (NSAIDs) and supplements.  This website has more information on Eliquis (apixaban): http://www.eliquis.com/eliquis/home    -1)You are taking Eliquis/apixaban which is your blood thinner so please Avoid ibuprofen/Advil/Aleve/Motrin/Goody Powders/Naproxen/BC powders/Meloxicam/Diclofenac/Indomethacin and other Nonsteroidal anti-inflammatory medications as these will make you more likely to bleed and can cause stomach ulcers, can also cause Kidney problems.   CBC and BMP every Friday starting 02/16/2020 for the next 3 weeks

## 2020-02-09 NOTE — Discharge Summary (Signed)
Traci Parker, is a 74 y.o. female  DOB Sep 20, 1945  MRN [id number].  Admission date:  02/01/2020  Admitting Physician  Albertine Patricia, MD  Discharge Date:  02/09/2020   Primary MD  Doree Albee, MD  Recommendations for primary care physician for things to follow:    1)You are taking Eliquis/apixaban which is your blood thinner so please Avoid ibuprofen/Advil/Aleve/Motrin/Goody Powders/Naproxen/BC powders/Meloxicam/Diclofenac/Indomethacin and other Nonsteroidal anti-inflammatory medications as these will make you more likely to bleed and can cause stomach ulcers, can also cause Kidney problems.   CBC and BMP every Friday starting 02/16/2020 for the next 3 weeks  Admission Diagnosis  Edema [R60.9] Hyponatremia [E87.1]   Discharge Diagnosis  Edema [R60.9] Hyponatremia [E87.1]    Active Problems:   Hyperlipidemia   Hypertension   Failure to thrive in adult   COPD (chronic obstructive pulmonary disease) (HCC)   CKD (chronic kidney disease)   Anemia due to chronic renal failure treated with erythropoietin, stage 3 (moderate)   Hyponatremia   Malnutrition of moderate degree   Pressure injury of skin   Acute deep vein thrombosis (DVT) of left lower extremity ([place])      Past Medical History:  Diagnosis Date  . Allergy   . Anemia    pernicious anemia  . Asthma   . Blood transfusion without reported diagnosis   . CKD (chronic kidney disease)   . COPD (chronic obstructive pulmonary disease) ([place]) 12/23/2019  . Gastritis   . Glaucoma   . History of blood in urine   . Hyperlipidemia   . Hypertension   . Ocular hypertension   . Osteoporosis   . Type 2 diabetes mellitus ([place]) 12/23/2019  . Vitamin B12 deficiency anemia due to intrinsic factor deficiency     Past Surgical History:  Procedure Laterality Date  . CHOLECYSTECTOMY  2008  . COLONOSCOPY  04/30/2011   Procedure: COLONOSCOPY;   Surgeon: Rogene Houston, MD;  [facility];  Service: Endoscopy;  Laterality: N/A;  . COLONOSCOPY  05/25/2012   Procedure: COLONOSCOPY;  Surgeon: Rogene Houston, MD;  [facility];  Service: Endoscopy;  Laterality: N/A;  1:25-changed to 1200 Ann to notify pt  . COLONOSCOPY Bilateral 12/2017  . ESOPHAGOGASTRODUODENOSCOPY N/A 12/07/2019   Procedure: ESOPHAGOGASTRODUODENOSCOPY (EGD);  Surgeon: Rogene Houston, MD;  [facility];  Service: Endoscopy;  Laterality: N/A;  155  . GALLBLADDER SURGERY    . right breast cystectomy  1988  . TUBAL LIGATION  1975       HPI  from the history and physical done on the day of admission:  -  Traci Parker  is a 74 y.o. female, with medical history significant ofseasonal allergies, pernicious anemia, asthma/COPD, history of blood transfusion, glaucoma, history of hematuria, hypertension, osteoporosis, type 2 diabetes with recent hospitalization due to pernicious anemia and failure to thrive, patient was sent from [facility] for hyponatremia, sodium sodium level was 120 on blood draw this morning at the facility, patient herself denies  any complaint, reports she has been feeling at baseline, patient reports at baseline she has poor appetite as she does not like her dysphagia to diet given she has no teeth or dentures, denies any nausea, vomiting, diarrhea, no fever, no chills, no chest pain or shortness of breath, and by reviewing SNF records does not appear she was started on any new medications which would contribute to her hyponatremia. -In ED sodium level was at 120, her hemoglobin was 9.1 which is around baseline, she is with known pernicious anemia, [facility] hospitalist were consulted to admit.    Hospital Course:  Brief Narrative:  74 year old female with a history of anemia, hypertension who was recently admitted to the skilled nursing facility for generalized weakness, ataxia and difficulty with ambulation.  She was  sent to the emergency room after she was found to be hyponatremic on labs.  She was admitted for IV fluids.  Further work-up indicated that she has left lower extremity DVT.  She is been started on anticoagulation with Eliquis ----  Assessment & Plan:   Active Problems:   Hyperlipidemia   Hypertension   Failure to thrive in adult   COPD (chronic obstructive pulmonary disease) (HCC)   CKD (chronic kidney disease)   Anemia due to chronic renal failure treated with erythropoietin, stage 3 (moderate)   Hyponatremia   Malnutrition of moderate degree   Pressure injury of skin   Acute deep vein thrombosis (DVT) of left lower extremity ([place])   1)Hyponatremia--Likely related to hypovolemia from poor p.o. intake.  -Improved with IV fluids, sodium is up to 134 from 120 on admission --- her p.o. intake has been poor since she is unable to eat due to lack of teeth.  Diet changed to pured diet.-- -Remeron ordered for appetite stimulation  2)Anemia---  Patient has anemia of chronic disease as well as B12 deficiency.  She has been receiving ESA as an outpatient.  Hemoglobin on admission trended down to 7.7 with IV hydration.  She received 1 unit PRBC with improvement of hemoglobin to 9.9.    CBC every Friday for the next 3 weeks advised.  3)Left lower extremity DVT---  Patient was non-ambulatory and taking Megace as an outpatient.  She was found to have extensive DVT in the left lower extremity.  Started on intravenous heparin.  Discontinue Megace.  Case reviewed with Dr. Carlis Abbott on-call for vascular surgery.  Due to her comorbidities, poor functional status, and lack of significant lower extremity symptoms, she was not felt to be in need of a thrombectomy at this time. Recommendations were for continued anticoagulation.  She has been transitioned to Eliquis.  With concurrent hyponatremia, failure to thrive and extensive DVT, CT chest abd/pelvis was checked to evaluate for underlying malignancy.  Fortunately, no obvious malignancy noted on imaging.  4)Failure to thrive/generalized weakness/ataxia.---She was at nursing facility for ataxia and difficulty with ambulation. I discussed her care with her primary neurologist, Dr. Leta Baptist who requested that MRI C-spine and MRI T-spine be obtained in her hospitalization due to significant barriers in obtaining this as an outpatient.  MRI T-spine performed was read as a normal study.  MRI C-spine shows degenerative changes with cervical spinal stenosis with mass-effect on spinal cord.  Case was reviewed with Dr. Newman Pies on-call for neurosurgery.  Due to the chronic nature of her symptoms, as well as her age and comorbidities, she would be a high risk surgical candidate.  It was not felt that surgical intervention was urgently needed at this  time.  Recommendations were to continue with physical therapy and follow-up with neurosurgery as an outpatient. Steroids were not recommended at this time.  --- [facility] has declined SNF rehab/therapy for patient  5)COPD.  No shortness of breath or wheezing at this time.  6)HTN--- Stable on Norvasc.  7)Malnutrition of moderate degree.    Remeron for appetite stimulation.  Pressure injury: Pressure Injury 02/01/20 Sacrum Stage 2 -  Partial thickness loss of dermis presenting as a shallow open injury with a red, pink wound bed without slough. (Active)  02/01/20 2033  Location: Sacrum  Location Orientation:   Staging: Stage 2 -  Partial thickness loss of dermis presenting as a shallow open injury with a red, pink wound bed without slough.  Wound Description (Comments):   Present on Admission: Yes  -Continue supportive measures   Code Status: Full code Family Communication: Discussed with her daughter at bedside on 02/08/2020 Disposition Plan:  [facility], insurance declined physical therapy SNF rehab   Dispo: The patient is from: SNF  Anticipated d/c is to: [facility]  home   Consultants:   Phone consult with neurosurgeon Dr. Dellis Filbert   Discharge Condition: Medically stable  Follow UP--neurosurgeon as outpatient  Diet and Activity recommendation:  As advised  Discharge Instructions    Discharge Instructions    Call MD for:  difficulty breathing, headache or visual disturbances   Complete by: As directed    Call MD for:  persistant dizziness or light-headedness   Complete by: As directed    Call MD for:  persistant nausea and vomiting   Complete by: As directed    Call MD for:  severe uncontrolled pain   Complete by: As directed    Call MD for:  temperature >100.4   Complete by: As directed    Diet - low sodium heart healthy   Complete by: As directed    Discharge instructions   Complete by: As directed    1)You are taking Eliquis/apixaban which is your blood thinner so please Avoid ibuprofen/Advil/Aleve/Motrin/Goody Powders/Naproxen/BC powders/Meloxicam/Diclofenac/Indomethacin and other Nonsteroidal anti-inflammatory medications as these will make you more likely to bleed and can cause stomach ulcers, can also cause Kidney problems.   CBC and BMP every Friday starting 02/16/2020 for the next 3 weeks   Discharge wound care:   Complete by: As directed    -Advised   Increase activity slowly   Complete by: As directed         Discharge Medications     Allergies as of 02/09/2020      Reactions   Latex Rash   Levofloxacin Nausea And Vomiting   Neomycin-bacitracin-polymyxin  [bacitracin-neomycin-polymyxin] Rash   Other Rash, Swelling   Prednisone Hives, Itching, Rash, Other (See Comments)   Fish Allergy    rash   Neosporin [neomycin-polymyxin-gramicidin] Itching, Rash      Medication List    STOP taking these medications   megestrol 400 MG/10ML suspension Commonly known as: MEGACE     TAKE these medications   acetaminophen 325 MG tablet Commonly known as: TYLENOL Take 2 tablets (650 mg total) by mouth every 6 (six)  hours as needed for mild pain (or Fever >/= 101).   albuterol 108 (90 Base) MCG/ACT inhaler Commonly known as: VENTOLIN HFA Inhale 2 puffs into the lungs every 4 (four) hours as needed for wheezing or shortness of breath.   ALPRAZolam 0.25 MG tablet Commonly known as: XANAX Take 1 tablet (0.25 mg total) by mouth 2 (two) times daily  as needed for anxiety. What changed:   medication strength  how much to take  how to take this  when to take this  reasons to take this  additional instructions   amLODipine 10 MG tablet Commonly known as: NORVASC Take 1 tablet (10 mg total) by mouth daily.   apixaban 5 MG Tabs tablet Commonly known as: Eliquis Take 1 tablet (5 mg total) by mouth 2 (two) times daily.   cyanocobalamin 1000 MCG/ML injection Commonly known as: (VITAMIN B-12) Inject 1 mL (1,000 mcg total) into the muscle every 30 (thirty) days.   dorzolamide 2 % ophthalmic solution Commonly known as: TRUSOPT Place 1 drop into both eyes 2 (two) times daily.   feeding supplement (ENSURE ENLIVE) Liqd Take 237 mLs by mouth 2 (two) times daily between meals.   folic acid 1 MG tablet Commonly known as: FOLVITE Take 1 mg by mouth daily.   gabapentin 100 MG capsule Commonly known as: NEURONTIN Take 100 mg by mouth at bedtime.   latanoprost 0.005 % ophthalmic solution Commonly known as: XALATAN Place 1 drop into both eyes at bedtime.   metoCLOPramide 5 MG tablet Commonly known as: Reglan Take 1 tablet (5 mg total) by mouth 3 (three) times daily before meals.   mirtazapine 15 MG tablet Commonly known as: Remeron Take 0.5 tablets (7.5 mg total) by mouth at bedtime.   omeprazole 40 MG capsule Commonly known as: PRILOSEC Take 40 mg by mouth daily.   ondansetron 4 MG disintegrating tablet Commonly known as: Zofran ODT Take 1 tablet (4 mg total) by mouth every 8 (eight) hours as needed for nausea or vomiting. Dissolve under tongue            Discharge Care  Instructions  (From admission, onward)         Start     Ordered   02/09/20 0000  Discharge wound care:       Comments: -Advised   02/09/20 1550          Major procedures and Radiology Reports - PLEASE review detailed and final reports for all details, in brief -    MR CERVICAL SPINE W WO CONTRAST  Result Date: 02/05/2020 CLINICAL DATA:  74 year old female with pain for 2 months. No known injury. Ataxia. EXAM: MRI CERVICAL SPINE WITHOUT AND WITH CONTRAST TECHNIQUE: Multiplanar and multiecho pulse sequences of the cervical spine, to include the craniocervical junction and cervicothoracic junction, were obtained without and with intravenous contrast. CONTRAST:  44mL GADAVIST GADOBUTROL 1 MMOL/ML IV SOLN COMPARISON:  Brain MRI 01/05/2020. FINDINGS: Study is mildly degraded by motion artifact despite repeated imaging attempts. Alignment: Straightening of cervical lordosis. No spondylolisthesis. Vertebrae: No marrow edema or evidence of acute osseous abnormality. Degenerative endplate marrow signal changes in the cervical spine. T1 vertebral body hemangioma. Cord: Multilevel degenerative cervical spinal cord mass effect, with appearance suspicious for some spinal cord myelomalacia at the C6 level greater on the left (series 19, image 20). No abnormal intradural enhancement. No dural thickening. Posterior Fossa, vertebral arteries, paraspinal tissues: Cervicomedullary junction, visible posterior fossa and brain parenchyma appear stable from last month. Preserved major vascular flow voids in the neck. The left vertebral artery appears mildly dominant. Negative visible neck soft tissues, right lung apex. Disc levels: C2-C3:  Mild facet and ligament flavum hypertrophy.  No stenosis. C3-C4: Mild circumferential disc bulge and endplate spurring. Broad-based posterior component with mild spinal stenosis and cord mass effect. Mild to moderate bilateral C4 foraminal stenosis. C4-C5: Disc space loss  with  circumferential disc osteophyte complex eccentric to the right (series 19, image 12). Mild facet hypertrophy. Mild to moderate spinal stenosis and spinal cord mass effect greater on the right. Severe right greater than left C5 foraminal stenosis. C5-C6: Disc space loss. Circumferential disc osteophyte complex with broad-based posterior component. Mild to moderate spinal stenosis and spinal cord mass effect. Moderate to severe left greater than right C6 foraminal stenosis. C6-C7: Disc space loss. Circumferential disc osteophyte complex. Broad-based posterior component with mild spinal stenosis and cord mass effect. Relative sparing of the neural foramina. Mild left C7 foraminal stenosis. C7-T1: Mild endplate spurring, facet and ligament flavum hypertrophy. No spinal stenosis. Mild to moderate left C8 foraminal stenosis. IMPRESSION: 1. Widespread cervical spine degeneration. No acute osseous abnormality. 2. Multilevel mild to moderate spinal stenosis and spinal cord mass effect at C3-C4 through C6-C7, with possible spinal cord myelomalacia at the C6 level. No other cord signal abnormality. 3. Moderate or severe degenerative neural foraminal stenosis at the bilateral C5 and C6 nerve levels. Electronically Signed   By: Genevie Ann M.D.   On: 02/05/2020 16:51   MR THORACIC SPINE W WO CONTRAST  Result Date: 02/05/2020 CLINICAL DATA:  74 year old female with pain for 2 months. No known injury. Ataxia. EXAM: MRI THORACIC WITHOUT AND WITH CONTRAST TECHNIQUE: Multiplanar and multiecho pulse sequences of the thoracic spine were obtained without and with intravenous contrast. CONTRAST:  67mL GADAVIST GADOBUTROL 1 MMOL/ML IV SOLN COMPARISON:  Cervical spine MRI today reported separately. FINDINGS: Axial images are mildly degraded by motion artifact despite repeated imaging attempts. Limited cervical spine imaging:  Reported separately today. Thoracic spine segmentation:  Appears to be normal. Alignment:  Normal thoracic kyphosis.   No spondylolisthesis. Vertebrae: Benign vertebral hemangioma in the T1 body. Normal background bone marrow signal. No marrow edema or evidence of acute osseous abnormality. Cord: Normal thoracic spinal cord signal and morphology. The conus medullaris appears normal at L1-L2. No abnormal intradural enhancement. No dural thickening. Paraspinal and other soft tissues: Negative. Disc levels: No age advanced thoracic spine degeneration. No significant disc herniation and no thoracic spinal stenosis. No thoracic foraminal stenosis. IMPRESSION: Normal for age MRI appearance of the thoracic spine. Electronically Signed   By: Genevie Ann M.D.   On: 02/05/2020 16:58   CT CHEST ABDOMEN PELVIS W CONTRAST  Result Date: 02/06/2020 CLINICAL DATA:  Hyponatremia, anemia, and lower extremity DVT. Evaluation for underlying malignancy. EXAM: CT CHEST, ABDOMEN, AND PELVIS WITH CONTRAST TECHNIQUE: Multidetector CT imaging of the chest, abdomen and pelvis was performed following the standard protocol during bolus administration of intravenous contrast. CONTRAST:  72mL OMNIPAQUE IOHEXOL 300 MG/ML  SOLN COMPARISON:  CTA chest 12/23/2019. CT abdomen and pelvis 12/14/2019. FINDINGS: CT CHEST FINDINGS Cardiovascular: Mild thoracic aortic atherosclerosis without aneurysm. Normal heart size. No pericardial effusion. Mediastinum/Nodes: 1 cm low-density left thyroid nodule, not considered clinically significant and for which no follow-up imaging is recommended. No enlarged axillary, mediastinal, or hilar lymph nodes. Unremarkable esophagus. Lungs/Pleura: No pleural effusion or pneumothorax. Mild paraseptal emphysema. Unchanged 3 mm right lower lobe nodule along the major fissure (series 4, image 78). Unchanged 2 mm nodule along the right minor fissure (series 4, image 86). Unchanged 2 mm subpleural nodule in the right middle lobe (series 4, image 91). Unchanged 3 mm subpleural nodule in the right middle lobe (series 4, image 111). Unchanged 3 mm  left upper lobe nodule (series 4, image 79). No mass. Minimal atelectasis or scarring in the lung bases. Musculoskeletal: No acute  osseous abnormality or suspicious osseous lesion. CT ABDOMEN PELVIS FINDINGS Hepatobiliary: Several subcentimeter low-density lesions in the liver measuring up to 5 mm, too small to fully characterize. No significant biliary dilatation status post cholecystectomy. Pancreas: Unremarkable. Spleen: Unremarkable. Adrenals/Urinary Tract: Unremarkable adrenal glands. 2 punctate unchanged right renal calculi. Poor visualization of the single punctate left renal calculus shown on the prior CT. No hydronephrosis. Unchanged 1.6 cm left lower pole renal cyst. Additional subcentimeter hypodensities in both kidneys, too small to fully characterize. Unremarkable bladder. Stomach/Bowel: The stomach is unremarkable. There is a large amount of stool in the rectum which is distended to 7 cm in diameter with new moderate presacral soft tissue thickening/stranding. A large amount of right-sided colonic stool is also present. There is no evidence of bowel obstruction. The appendix is not clearly identified, however there is no Peri cecal inflammation. Vascular/Lymphatic: Aortic atherosclerosis. Unchanged ectasia of the infrarenal abdominal aorta with maximal diameter of 2.7 cm. Partial imaging of left femoral DVT, more fully evaluated on 02/02/2020 ultrasound. No enlarged lymph nodes. Reproductive: Uterus and bilateral adnexa are unremarkable. Other: No ascites or pneumoperitoneum. Partially visualized left thigh edema in the setting of the known extensive DVT. Musculoskeletal: No acute osseous abnormality or suspicious osseous lesion. IMPRESSION: 1. No definite evidence of malignancy in the chest, abdomen, or pelvis. 2. Unchanged small bilateral lung nodules measuring up to 3 mm. Non-contrast chest CT can be considered in 12 months. This recommendation follows the consensus statement: Guidelines for  Management of Incidental Pulmonary Nodules Detected on CT Images: From the Fleischner Society 2017; Radiology 2017; 284:228-243. 3. Large stool burden with rectal distension and presacral inflammation which may reflect stercoral proctitis. 4. Known left lower extremity DVT. 5. Nonobstructing nephrolithiasis. 6. Aortic Atherosclerosis (ICD10-I70.0) and Emphysema (ICD10-J43.9). Electronically Signed   By: Logan Bores M.D.   On: 02/06/2020 13:05   US Venous Img Lower Bilateral (DVT)  Result Date: 02/02/2020 CLINICAL DATA:  Bilateral lower extremity edema EXAM: BILATERAL LOWER EXTREMITY VENOUS DOPPLER ULTRASOUND TECHNIQUE: Gray-scale sonography with graded compression, as well as color Doppler and duplex ultrasound were performed to evaluate the lower extremity deep venous systems from the level of the common femoral vein and including the common femoral, femoral, profunda femoral, popliteal and calf veins including the posterior tibial, peroneal and gastrocnemius veins when visible. The superficial great saphenous vein was also interrogated. Spectral Doppler was utilized to evaluate flow at rest and with distal augmentation maneuvers in the common femoral, femoral and popliteal veins. COMPARISON:  None. FINDINGS: RIGHT LOWER EXTREMITY Common Femoral Vein: No evidence of thrombus. Normal compressibility, respiratory phasicity and response to augmentation. Saphenofemoral Junction: No evidence of thrombus. Normal compressibility and flow on color Doppler imaging. Profunda Femoral Vein: No evidence of thrombus. Normal compressibility and flow on color Doppler imaging. Femoral Vein: No evidence of thrombus. Normal compressibility, respiratory phasicity and response to augmentation. Popliteal Vein: No evidence of thrombus. Normal compressibility, respiratory phasicity and response to augmentation. Calf Veins: No evidence of thrombus. Normal compressibility and flow on color Doppler imaging. Superficial Great Saphenous  Vein: No evidence of thrombus. Normal compressibility. Venous Reflux:  None. Other Findings:  None. LEFT LOWER EXTREMITY Common Femoral Vein: Hypoechoic occlusive appearing thrombus. Vessel noncompressible. Saphenofemoral Junction: Thrombus does appear to extend into the saphenofemoral junction. Profunda Femoral Vein: Hypoechoic occlusive thrombus. Vessel noncompressible. Femoral Vein: Hypoechoic occlusive thrombus. Vessel noncompressible. Popliteal Vein: Similar hypoechoic occlusive thrombus. Vessel noncompressible. Calf Veins: Limited calf vein visualization but thrombus does appear to extend into the peroneal  and anterior tibial veins. IMPRESSION: Extensive acute appearing left femoropopliteal occlusive DVT extending into the calf veins. Negative for right lower extremity DVT These results were called by telephone at the time of interpretation on 02/02/2020 at 3:11 pm to provider DAWOOD [facility] , who verbally acknowledged these results. Electronically Signed   By: Jerilynn Mages.  Shick M.D.   On: 02/02/2020 15:14   DG CHEST PORT 1 VIEW  Result Date: 02/02/2020 CLINICAL DATA:  Unexplained fever, COVID negative on 01/31/2020, history diabetes mellitus, COPD, asthma, hypertension, former smoker EXAM: PORTABLE CHEST 1 VIEW COMPARISON:  Portable exam 0941 hours compared to 12/23/2019 FINDINGS: Normal heart size and pulmonary vascularity. Tortuous aorta with atherosclerotic calcification. Emphysematous changes without pulmonary infiltrate, pleural effusion or pneumothorax. Skin folds project over the lower LEFT lung laterally. Probable RIGHT nipple shadow. Bones demineralized. IMPRESSION: COPD changes without acute infiltrate. Electronically Signed   By: Lavonia Dana M.D.   On: 02/02/2020 10:00   [place] EKG SITE RITE  Result Date: 02/02/2020 If Site Rite image not attached, placement could not be confirmed due to current cardiac rhythm.   Micro Results    Recent Results (from the past 240 hour(s))  SARS Coronavirus 2 by  RT PCR (hospital order, performed in [facility] hospital lab) Nasopharyngeal Nasopharyngeal Swab     Status: None   Collection Time: 02/01/20  6:42 PM   Specimen: Nasopharyngeal Swab  Result Value Ref Range Status   SARS Coronavirus 2 NEGATIVE NEGATIVE Final    Comment: (NOTE) SARS-CoV-2 target nucleic acids are NOT DETECTED.  The SARS-CoV-2 RNA is generally detectable in upper and lower respiratory specimens during the acute phase of infection. The lowest concentration of SARS-CoV-2 viral copies this assay can detect is 250 copies / mL. A negative result does not preclude SARS-CoV-2 infection and should not be used as the sole basis for treatment or other patient management decisions.  A negative result may occur with improper specimen collection / handling, submission of specimen other than nasopharyngeal swab, presence of viral mutation(s) within the areas targeted by this assay, and inadequate number of viral copies (<250 copies / mL). A negative result must be combined with clinical observations, patient history, and epidemiological information.  Fact Sheet for Patients:   StrictlyIdeas.no  Fact Sheet for Healthcare Providers: BankingDealers.co.za  This test is not yet approved or  cleared by the [place] FDA and has been authorized for detection and/or diagnosis of SARS-CoV-2 by FDA under an Emergency Use Authorization (EUA).  This EUA will remain in effect (meaning this test can be used) for the duration of the COVID-19 declaration under Section 564(b)(1) of the Act, 21 U.S.C. section 360bbb-3(b)(1), unless the authorization is terminated or revoked sooner.  Performed at [facility], [address] [place], [place] [address]   SARS Coronavirus 2 by RT PCR (hospital order, performed in [facility] hospital lab) Nasopharyngeal Nasopharyngeal Swab     Status: None   Collection Time: 02/07/20  3:40 PM   Specimen:  Nasopharyngeal Swab  Result Value Ref Range Status   SARS Coronavirus 2 NEGATIVE NEGATIVE Final    Comment: (NOTE) SARS-CoV-2 target nucleic acids are NOT DETECTED.  The SARS-CoV-2 RNA is generally detectable in upper and lower respiratory specimens during the acute phase of infection. The lowest concentration of SARS-CoV-2 viral copies this assay can detect is 250 copies / mL. A negative result does not preclude SARS-CoV-2 infection and should not be used as the sole basis for treatment or other patient management decisions.  A negative result may occur with improper specimen collection / handling, submission of specimen other than nasopharyngeal swab, presence of viral mutation(s) within the areas targeted by this assay, and inadequate number of viral copies (<250 copies / mL). A negative result must be combined with clinical observations, patient history, and epidemiological information.  Fact Sheet for Patients:   StrictlyIdeas.no  Fact Sheet for Healthcare Providers: BankingDealers.co.za  This test is not yet approved or  cleared by the [place] FDA and has been authorized for detection and/or diagnosis of SARS-CoV-2 by FDA under an Emergency Use Authorization (EUA).  This EUA will remain in effect (meaning this test can be used) for the duration of the COVID-19 declaration under Section 564(b)(1) of the Act, 21 U.S.C. section 360bbb-3(b)(1), unless the authorization is terminated or revoked sooner.  Performed at [facility], [address], [place], [place] [address]   MRSA PCR Screening     Status: None   Collection Time: 02/09/20  4:34 AM   Specimen: Nasopharyngeal  Result Value Ref Range Status   MRSA by PCR NEGATIVE NEGATIVE Final    Comment:        The GeneXpert MRSA Assay (FDA approved for NASAL specimens only), is one component of a comprehensive MRSA colonization surveillance program. It is not intended to  diagnose MRSA infection nor to guide or monitor treatment for MRSA infections. Performed at [facility], [address] [place], [place] [address]        Today   Subjective    Geneva Barrero today has no new concerns -  No chest pains no vomiting no diarrhea   - Insurance company has declined SNF rehab/therapy for patient        Patient has been seen and examined prior to discharge   Objective   Blood pressure 113/68, pulse 94, temperature 99.4 F (37.4 C), temperature source Oral, resp. rate 17, height 5\' 4"  (1.626 m), weight 53.7 kg, SpO2 98 %.   Intake/Output Summary (Last 24 hours) at 02/09/2020 1558 Last data filed at 02/09/2020 0900 Gross per 24 hour  Intake 240 ml  Output --  Net 240 ml   Exam Gen:- Awake Alert, no acute distress  HEENT:- [place].AT, No sclera icterus Neck-Supple Neck,No JVD,.  Lungs-  CTAB , good air movement bilaterally  CV- S1, S2 normal, regular Abd-  +ve B.Sounds, Abd Soft, No tenderness,    Extremity/Skin:-Trace edema,   good pulses Psych-affect is appropriate, oriented x3 Neuro-generalized weakness, no new focal deficits, no tremors    Data Review   CBC w Diff:  Lab Results  Component Value Date   WBC 7.1 02/07/2020   HGB 9.9 (L) 02/07/2020   HCT 31.6 (L) 02/07/2020   PLT 278 02/07/2020   LYMPHOPCT 12 02/01/2020   MONOPCT 8 02/01/2020   EOSPCT 1 02/01/2020   BASOPCT 0 02/01/2020    CMP:  Lab Results  Component Value Date   NA 134 (L) 02/08/2020   K 3.6 02/08/2020   CL 105 02/08/2020   CO2 21 (L) 02/08/2020   BUN 13 02/08/2020   CREATININE 0.91 02/08/2020   CREATININE 1.38 (H) 03/24/2019   PROT 5.2 (L) 02/05/2020   PROT 6.5 01/17/2020   ALBUMIN 2.6 (L) 02/05/2020   BILITOT 0.5 02/05/2020   ALKPHOS 48 02/05/2020   AST 9 (L) 02/05/2020   ALT 10 02/05/2020  . Total Discharge time is about 33 minutes  Roxan Hockey M.D on 02/09/2020 at 3:58 PM  Go to [email or web address] -  for contact info  [facility] - Office   [phone]

## 2020-02-09 NOTE — Plan of Care (Signed)

## 2020-02-09 NOTE — Progress Notes (Signed)
Report called and given to [facility] LPN, [facility]. All questions were answered and no further questions at this time. Patient in stable condition and in no acute distress at time of discharge. Patient will be transported to [facility] after dinner at approximately 1830.

## 2020-02-09 NOTE — Care Management Important Message (Signed)
Important Message  Patient Details  Name: Traci Parker MRN: [id number] Date of Birth: July 01, 1946   Medicare Important Message Given:  Yes     Tommy Medal 02/09/2020, 1:30 PM

## 2020-02-09 NOTE — Progress Notes (Signed)
Nutrition Follow-up  DOCUMENTATION CODES:   Non-severe (moderate) malnutrition in context of chronic illness  INTERVENTION:  Continue Ensure Enlive BID Continue Magic Cup TID Continue Juven BID  NUTRITION DIAGNOSIS:   Moderate Malnutrition related to chronic illness (intermittent nausea and vomiting, poor appetite  the past 3 months) as evidenced by per patient/family report, percent weight loss. -ongoing  GOAL:   Patient will meet greater than or equal to 90% of their needs -progressing  MONITOR:   PO intake, Supplement acceptance, Weight trends, Skin, Labs  REASON FOR ASSESSMENT:   Consult Assessment of nutrition requirement/status  ASSESSMENT:   Patient from SNF. Hx of nausea and vomiting, FTT, Malnutrition, COPD, DM-2, HTN and multiple falls. Presents with hyponatremia, generalized weakness.  Patient receiving DYS 1 (puree diet), meal intake has been consistently poor throughout admission eating 10-25% of meals, however per flowsheets po intake has significantly improved, consuming 75% of the last 3 meals and has accepted 10 of 14 Ensure supplements this admission.   No new weights this admission, recommend weighing pt as able to assess trends.   Patient is medically stable for discharge, pending insurance authorization for return to SNF.  Medications reviewed and include: X44, Folic acid, Gabapentin, Reglan, Juven, Miralax  7/29 Labs reviewed  Diet Order:   Diet Order            DIET - DYS 1 Room service appropriate? Yes; Fluid consistency: Thin  Diet effective now                 EDUCATION NEEDS:   Education needs have been addressed  Skin:  Skin Assessment: Skin Integrity Issues: Skin Integrity Issues:: Stage II Stage II: sacrum  Last BM:  7/29 -type 7  Height:   Ht Readings from Last 1 Encounters:  02/01/20 5\' 4"  (1.626 m)    Weight:   Wt Readings from Last 1 Encounters:  02/01/20 53.7 kg     BMI:  Body mass index is 20.32  kg/m.  Estimated Nutritional Needs:   Kcal:  8185-6314  Protein:  75-86 gr  Fluid:  >1600 ml daily   Lajuan Lines, RD, LDN Clinical Nutrition After Hours/Weekend Pager # in [place]

## 2020-02-09 NOTE — TOC Progression Note (Addendum)
Transition of Care [facility]) - Progression Note   Patient Details  Name: Traci Parker MRN: [id number] Date of Birth: 12/16/1945  Transition of Care [facility]) CM/SW [place], LCSW Phone Number: 02/09/2020, 2:17 PM  Clinical Narrative: CSW received call from Sheridan with [facility] stating the patient was denied by her insurance and provided CSW with the number for the family to call if they want to appeal the denial ([phone]). Per Tammy, if the patient does not overturn the appeal, [facility] is able to offer the patient a LTC Medicaid bed.   CSW provided family with number to call to appeal the denial as well as explained [facility] can offer a LTC Medicaid bed. CSW requested family call back with their decision.  CSW received call from Tammy with [facility]. Per Tammy, the family has agreed to take the Medicaid bed so patient will need a discharge summary and a new COVID test. CSW confirmed with patient's daughter, Traci Parker, the family accepted the Medicaid bed. CSW updated EDP and AC. CSW notified RN the number to call for report ([phone]) and patient will go to room 148. TOC signing off.  Expected Discharge Plan: Skilled Nursing Facility Barriers to Discharge: Insurance Authorization  Expected Discharge Plan and Services Expected Discharge Plan: [place] Expected Discharge Date: 02/09/20               Readmission Risk Interventions Readmission Risk Prevention Plan 12/25/2019  Transportation Screening Complete  Home Care Screening Not Complete  Home Care Screening Not Completed Comments SNF referral  Medication Review (RN CM) Complete  Some recent data might be hidden

## 2020-02-12 ENCOUNTER — Encounter: Payer: Self-pay | Admitting: Adult Health

## 2020-02-12 ENCOUNTER — Non-Acute Institutional Stay (SKILLED_NURSING_FACILITY): Payer: Medicare Other | Admitting: Adult Health

## 2020-02-12 ENCOUNTER — Telehealth (INDEPENDENT_AMBULATORY_CARE_PROVIDER_SITE_OTHER): Payer: Self-pay | Admitting: Nurse Practitioner

## 2020-02-12 DIAGNOSIS — E538 Deficiency of other specified B group vitamins: Secondary | ICD-10-CM

## 2020-02-12 DIAGNOSIS — I129 Hypertensive chronic kidney disease with stage 1 through stage 4 chronic kidney disease, or unspecified chronic kidney disease: Secondary | ICD-10-CM

## 2020-02-12 DIAGNOSIS — E1122 Type 2 diabetes mellitus with diabetic chronic kidney disease: Secondary | ICD-10-CM | POA: Diagnosis not present

## 2020-02-12 DIAGNOSIS — N183 Chronic kidney disease, stage 3 unspecified: Secondary | ICD-10-CM

## 2020-02-12 DIAGNOSIS — R627 Adult failure to thrive: Secondary | ICD-10-CM

## 2020-02-12 DIAGNOSIS — J449 Chronic obstructive pulmonary disease, unspecified: Secondary | ICD-10-CM

## 2020-02-12 DIAGNOSIS — H40053 Ocular hypertension, bilateral: Secondary | ICD-10-CM

## 2020-02-12 DIAGNOSIS — I824Y2 Acute embolism and thrombosis of unspecified deep veins of left proximal lower extremity: Secondary | ICD-10-CM

## 2020-02-12 DIAGNOSIS — E1121 Type 2 diabetes mellitus with diabetic nephropathy: Secondary | ICD-10-CM | POA: Diagnosis not present

## 2020-02-12 DIAGNOSIS — K2950 Unspecified chronic gastritis without bleeding: Secondary | ICD-10-CM | POA: Diagnosis not present

## 2020-02-12 DIAGNOSIS — N1832 Chronic kidney disease, stage 3b: Secondary | ICD-10-CM

## 2020-02-12 DIAGNOSIS — R270 Ataxia, unspecified: Secondary | ICD-10-CM

## 2020-02-12 DIAGNOSIS — I700 Atherosclerosis of aorta: Secondary | ICD-10-CM

## 2020-02-12 DIAGNOSIS — D631 Anemia in chronic kidney disease: Secondary | ICD-10-CM

## 2020-02-12 NOTE — Progress Notes (Signed)
Location:    [place] Room Number: 148/W Place of Service:  SNF (31)   CODE STATUS: Full Code  Allergies  Allergen Reactions  . Latex Rash  . Levofloxacin Nausea And Vomiting  . Neomycin-Bacitracin-Polymyxin  [Bacitracin-Neomycin-Polymyxin] Rash  . Other Rash and Swelling  . Prednisone Hives, Itching, Rash and Other (See Comments)  . Fish Allergy     rash  . Neosporin [Neomycin-Polymyxin-Gramicidin] Itching and Rash    Chief Complaint  Patient presents with  . Hospitalization Follow-up    Hospitalization Follow Up    HPI:  She is a 74 year old woman who has been hospitalized from 02-01-20 through 02-09-20. She was hospitalized for hyponatremia with a sodium level was 120; more than likely due to her poor po intake. Her level improved with ivf. She was found to have a left lower extremity dvt. Her megace was stopped. Her goal at this time is for her to return back home. She denies any pain; no nausea or vomiting. She denies any numbness or tingling present. She will continue to be followed for her chronic illnesses including: copd; hypertension; gastritis.    Past Medical History:  Diagnosis Date  . Allergy   . Anemia    pernicious anemia  . Asthma   . Blood transfusion without reported diagnosis   . CKD (chronic kidney disease)   . COPD (chronic obstructive pulmonary disease) ([place]) 12/23/2019  . Gastritis   . Glaucoma   . History of blood in urine   . Hyperlipidemia   . Hypertension   . Ocular hypertension   . Osteoporosis   . Type 2 diabetes mellitus ([place]) 12/23/2019  . Vitamin B12 deficiency anemia due to intrinsic factor deficiency     Past Surgical History:  Procedure Laterality Date  . CHOLECYSTECTOMY  2008  . COLONOSCOPY  04/30/2011   Procedure: COLONOSCOPY;  Surgeon: Rogene Houston, MD;  [facility];  Service: Endoscopy;  Laterality: N/A;  . COLONOSCOPY  05/25/2012   Procedure: COLONOSCOPY;  Surgeon: Rogene Houston,  MD;  [facility];  Service: Endoscopy;  Laterality: N/A;  1:25-changed to 1200 Ann to notify pt  . COLONOSCOPY Bilateral 12/2017  . ESOPHAGOGASTRODUODENOSCOPY N/A 12/07/2019   Procedure: ESOPHAGOGASTRODUODENOSCOPY (EGD);  Surgeon: Rogene Houston, MD;  [facility];  Service: Endoscopy;  Laterality: N/A;  155  . GALLBLADDER SURGERY    . right breast cystectomy  1988  . TUBAL LIGATION  1975    Social History   Socioeconomic History  . Marital status: Divorced    Spouse name: Not on file  . Number of children: 4  . Years of education: 9  . Highest education level: Not on file  Occupational History  . Not on file  Tobacco Use  . Smoking status: Former Smoker    Packs/day: 1.00    Years: 40.00    Pack years: 40.00    Quit date: 04/03/2011    Years since quitting: 8.8  . Smokeless tobacco: Never Used  Vaping Use  . Vaping Use: Never used  Substance and Sexual Activity  . Alcohol use: No  . Drug use: No  . Sexual activity: Not Currently  Other Topics Concern  . Not on file  Social History Narrative   01/17/20 lives at [facility], [place]   Social Determinants of Health   Financial Resource Strain:   . Difficulty of Paying Living Expenses:   Food Insecurity:   . Worried  About Running Out of Food in the Last Year:   . [place] in the Last Year:   Transportation Needs:   . Lack of Transportation (Medical):   Marland Kitchen Lack of Transportation (Non-Medical):   Physical Activity:   . Days of Exercise per Week:   . Minutes of Exercise per Session:   Stress:   . Feeling of Stress :   Social Connections:   . Frequency of Communication with Friends and Family:   . Frequency of Social Gatherings with Friends and Family:   . Attends Religious Services:   . Active Member of Clubs or Organizations:   . Attends Archivist Meetings:   Marland Kitchen Marital Status:   Intimate Partner Violence:   . Fear of Current or Ex-Partner:   . Emotionally Abused:   Marland Kitchen  Physically Abused:   . Sexually Abused:    Family History  Problem Relation Age of Onset  . Diabetes Mother   . Hyperlipidemia Mother   . Hypertension Mother   . Heart disease Father   . Hyperlipidemia Father   . Diabetes Sister   . Hyperlipidemia Sister   . Hypertension Sister   . Cancer Brother   . Heart disease Brother   . Hyperlipidemia Brother   . Hypertension Brother   . Diabetes Sister       VITAL SIGNS BP 104/68   Pulse (!) 102   Temp 98.2 F (36.8 C) (Oral)   Resp 19   Ht 5\' 4"  (1.626 m)   Wt 109 lb 3.2 oz (49.5 kg)   SpO2 96%   BMI 18.74 kg/m   Outpatient Encounter Medications as of 02/12/2020  Medication Sig  . acetaminophen (TYLENOL) 325 MG tablet Take 2 tablets (650 mg total) by mouth every 6 (six) hours as needed for mild pain (or Fever >/= 101).  Marland Kitchen albuterol (VENTOLIN HFA) 108 (90 Base) MCG/ACT inhaler Inhale 2 puffs into the lungs every 4 (four) hours as needed for wheezing or shortness of breath.  . ALPRAZolam (XANAX) 0.25 MG tablet Take 1 tablet (0.25 mg total) by mouth 2 (two) times daily as needed for anxiety.  Marland Kitchen amLODipine (NORVASC) 10 MG tablet Take 1 tablet (10 mg total) by mouth daily.  Marland Kitchen apixaban (ELIQUIS) 5 MG TABS tablet Take 1 tablet (5 mg total) by mouth 2 (two) times daily.  . cyanocobalamin (,VITAMIN B-12,) 1000 MCG/ML injection Inject 1 mL (1,000 mcg total) into the muscle every 30 (thirty) days.  . dorzolamide (TRUSOPT) 2 % ophthalmic solution Place 1 drop into both eyes 2 (two) times daily.  . feeding supplement, ENSURE ENLIVE, (ENSURE ENLIVE) LIQD Take 237 mLs by mouth 2 (two) times daily between meals.  . folic acid (FOLVITE) 1 MG tablet Take 1 mg by mouth daily.   Marland Kitchen gabapentin (NEURONTIN) 100 MG capsule Take 100 mg by mouth at bedtime.  Marland Kitchen latanoprost (XALATAN) 0.005 % ophthalmic solution Place 1 drop into both eyes at bedtime.  . metoCLOPramide (REGLAN) 5 MG tablet Take 1 tablet (5 mg total) by mouth 3 (three) times daily before meals.   . mirtazapine (REMERON) 15 MG tablet Take 0.5 tablets (7.5 mg total) by mouth at bedtime.  Marland Kitchen omeprazole (PRILOSEC) 40 MG capsule Take 40 mg by mouth daily.   . ondansetron (ZOFRAN ODT) 4 MG disintegrating tablet Take 1 tablet (4 mg total) by mouth every 8 (eight) hours as needed for nausea or vomiting. Dissolve under tongue   No facility-administered encounter medications on file  as of 02/12/2020.     SIGNIFICANT DIAGNOSTIC EXAMS  PREVIOUS   12-23-19: chest x-ray: No active disease.   12-23-19: ct angio of chest:  1. No evidence of pulmonary embolism. 2. Paraseptal emphysema, mild and worse at the lung apices. 3. 3 mm nodule along the fissure in the RIGHT lower lobe. . 4. Moderate and with 3.6 cm caliber of ascending thoracic aorta.. Aortic aneurysm  5. Emphysema and aortic atherosclerosis.   TODAY  02-02-20: chest x-ray: COPD changes without acute infiltrate   02-02-20: bilateral lower extremity venous ultrasound:  Extensive acute appearing left femoropopliteal occlusive DVT extending into the calf veins. Negative for right lower extremity DVT  02-05-20: MRI cervical spine:  1. Widespread cervical spine degeneration. No acute osseous abnormality. 2. Multilevel mild to moderate spinal stenosis and spinal cord mass effect at C3-C4 through C6-C7, with possible spinal cord myelomalacia at the C6 level. No other cord signal abnormality. 3. Moderate or severe degenerative neural foraminal stenosis at the bilateral C5 and C6 nerve levels.   02-05-20: MRI thoracic spine:  Normal for age MRI appearance of the thoracic spine   02-06-20: ct of chest abdomen and pelvis:  1. No definite evidence of malignancy in the chest, abdomen, or pelvis. 2. Unchanged small bilateral lung nodules measuring up to 3 mm. Non-contrast chest CT can be considered in 12 months.  3. Large stool burden with rectal distension and presacral inflammation which may reflect stercoral proctitis. 4. Known left lower  extremity DVT. 5. Nonobstructing nephrolithiasis. 6. Aortic Atherosclerosis and Emphysema .    LABS REVIEWED PREVIOUS   12-23-19: wbc 8.2; hgb 7.3; hct 25.2; mcv 58.3 plt 220; glucose 114; bun 15; creat 1.46; k+ 3.2; na++ 137 ca 9.4 liver normal albumin 4.1 d-dimer 0.86; mag 1.5; phos 3.3; urine culture: e-coli 12-25-19: wbc 8.2; hgb 7.1; hct 23.5; mcv 57.6 plt 215; glucose 111; bun 8; creat 1.08; k+ 4.2; na++ 135 ca 8.8; mag 1.6 12-26-19: wbc 7.9; hgb 9.5; hct 30.0 mcv 63.6 plt 205; glucose 110; bun 13; creat 1.21; k+ 4.1; na++ 136; ca 9.2 phos 3.4 albumin 3.6  01-01-20: wbc 10.8; hgb 9.9; hct 32.5; mcv 65.4 plt 282; glucose 99; bun 21; creat 1.41; k+ 3.9; na++ 133;ca 9.1 mag 1.9 free t4: 1.37 free t3: 2.1 tsh 7.150 01-08-20: ANA neg; CRP 15.6 01-09-20: wbc 8.2; hgb 8.2; hct 25.9; mcv 66.6 plt 238;  01-10-20: wbc 10.2; hgb 8.9; hct 28.2; mcv 67.6 plt 290; gucose 117; bun 25; creat 1.59; k+ 4.2; na++ 138; ca 9.2; live normal albumin 3.4 mag 1.9 RPR neg  TODAY  02-01-20: wbc 10.6; hgb 28.1; mcv 74.1 plt 271; glucose 140 bun 27; creat 1.28; k+ 4.4; an++ 120; ca 8.9 liver normal albumin 3.4 mag 1.9 02-04-20: wbc 4.7; hgb 9.2; hct 28.7; mcv 78.2 plt 273 02-05-20: wbc 5.0; hgb 9.4; hct 29.2; mcv 78.3 plt 270; glucose 95; bun 19; creat 0.90; k+ 3.5; na++ 135; ca 8.6 liver normal albumin 2.6 02-08-20; glucose 85; bun 13; creat 0.91; k+ 3.6; an++ 134; ca 8.6   Review of Systems  Constitutional: Negative for malaise/fatigue.  Respiratory: Negative for cough and shortness of breath.   Cardiovascular: Negative for chest pain, palpitations and leg swelling.  Gastrointestinal: Negative for abdominal pain, constipation and heartburn.  Musculoskeletal: Negative for back pain, joint pain and myalgias.  Skin: Negative.   Neurological: Negative for dizziness.  Psychiatric/Behavioral: The patient is not nervous/anxious.       Physical Exam Constitutional:  General: She is not in acute distress.     Appearance: She is underweight. She is not diaphoretic.  Neck:     Thyroid: No thyromegaly.  Cardiovascular:     Rate and Rhythm: Normal rate and regular rhythm.     Pulses: Normal pulses.     Heart sounds: Normal heart sounds.  Pulmonary:     Effort: Pulmonary effort is normal. No respiratory distress.     Breath sounds: Normal breath sounds.  Abdominal:     General: Bowel sounds are normal. There is no distension.     Palpations: Abdomen is soft.     Tenderness: There is no abdominal tenderness.  Musculoskeletal:     Cervical back: Neck supple.     Right lower leg: No edema.     Left lower leg: No edema.     Comments: Is able to move all extremities   Lymphadenopathy:     Cervical: No cervical adenopathy.  Skin:    General: Skin is warm and dry.  Neurological:     Mental Status: She is alert and oriented to person, place, and time.  Psychiatric:        Mood and Affect: Mood normal.       ASSESSMENT/ PLAN:  TODAY  1. Acute deep vein thrombosis (DVT) of proximal vein of left lower extremity: diagnosed 02-02-20 will continue eliquis 5 mg twice daily   2. Chronic obstructive pulmonary disease unspecified COPD type: is stable will continue albuterol 2 puffs every 4 hours as needed  3. Peripheral neuropathy: is stable will continue gabapentin 100 mg nightly   4. Ataxia: is without change in status will monitor   5. CKD stage 3 due to diabetes melltus type 2 is stable bun 13; creat 0.91  6. Type 2 diabetes mellitus with stage 2 chronic kidney disease without long term current use of insulin will monitor   7. Other chronic gastritis without hemorrhage is stable will continue prilosec 40 mg daily reglan 5 mg three times daily    8. Hypertension associated with stage 2 chronic kidney disease due to type 2 diabetes mellitus: is stable b/p 104/68 will continue norvasc 10 mg daily   9. Anemia due to chronic renal failure treated with erythropoietin stage 3: is stable hgb 9.4;  will continue therapy per hematology    10. Increased intraocular pressure bilateral: is stable will continue trusopt to both eyes twice daily and xalatan to eye nightly   11. Vit B 12: deficiency: is stable will continue monthly b12 injections and folic acid 1 mg daily   12. Failure to thrive in adult is without change will continue remeron 7.5 mg nightly is off megace due to dvt.   13. Aortic atherosclerosis: is stable will monitor    will monitor cbc bmp      MD is aware of resident's narcotic use and is in agreement with current plan of care. We will attempt to wean resident as appropriate.  Ok Edwards NP [facility]  Contact [phone] Monday through Friday 8am- 5pm  After hours call [phone]

## 2020-02-12 NOTE — Telephone Encounter (Signed)
I believe this patient is from the [facility], but I did see that she was hospitalized recently.  Discharge recommendations that she has blood work every Thursday for the next 3 weeks.  Please call the [facility] and verify whether or not they are going to be collecting the blood work on this patient and addressing the results.  If they are not, please get her scheduled for a lab draw starting this Thursday and repeat every week for a total of 3 weeks.  Please let me know what you find out, because if she is coming in on Thursday I will need to place the orders for blood work.  Thank you.

## 2020-02-13 ENCOUNTER — Non-Acute Institutional Stay (SKILLED_NURSING_FACILITY): Payer: Medicare Other | Admitting: Internal Medicine

## 2020-02-13 ENCOUNTER — Encounter: Payer: Self-pay | Admitting: Internal Medicine

## 2020-02-13 DIAGNOSIS — E871 Hypo-osmolality and hyponatremia: Secondary | ICD-10-CM | POA: Diagnosis not present

## 2020-02-13 DIAGNOSIS — I824Y2 Acute embolism and thrombosis of unspecified deep veins of left proximal lower extremity: Secondary | ICD-10-CM | POA: Diagnosis not present

## 2020-02-13 DIAGNOSIS — R911 Solitary pulmonary nodule: Secondary | ICD-10-CM

## 2020-02-13 DIAGNOSIS — R000 Tachycardia, unspecified: Secondary | ICD-10-CM

## 2020-02-13 DIAGNOSIS — D631 Anemia in chronic kidney disease: Secondary | ICD-10-CM

## 2020-02-13 DIAGNOSIS — N183 Chronic kidney disease, stage 3 unspecified: Secondary | ICD-10-CM | POA: Diagnosis not present

## 2020-02-13 DIAGNOSIS — R270 Ataxia, unspecified: Secondary | ICD-10-CM

## 2020-02-13 DIAGNOSIS — IMO0001 Reserved for inherently not codable concepts without codable children: Secondary | ICD-10-CM

## 2020-02-13 NOTE — Assessment & Plan Note (Addendum)
Continue pured diet and Remeron in attempt to improve oral intake.

## 2020-02-13 NOTE — Patient Instructions (Signed)
See assessment and plan under each diagnosis in the problem list and acutely for this visit 

## 2020-02-13 NOTE — Assessment & Plan Note (Signed)
PT/OT at SNF °

## 2020-02-13 NOTE — Assessment & Plan Note (Addendum)
01/01/2020 TSH was elevated at 7.15 (suggesting hypothyroid state)  but free T4 was also elevated at 1.37 suggesting possible evolving thyroid status.  Because of persistent tachycardia TFTs will be repeated to define current status. 24 hour metanephrines & catecholamines ordered in June because of tachycardia, tremor & anxiety never completed. These will be reordered if repeat TFTs therapeutic.

## 2020-02-13 NOTE — Assessment & Plan Note (Addendum)
While hospitalized for hyponatremia and DVT, hemoglobin trended down to 7.7 with IV hydration. S/P 1 unit packed cells hemoglobin improved to 9.9. CBC monitor weekly x3 post discharge.

## 2020-02-13 NOTE — Progress Notes (Signed)
NURSING HOME LOCATION:  [place] ROOM NUMBER: 148/W   CODE STATUS:  Full Code PCP:  Doree Albee, MD  This is a readmission note to [facility].Included are preadmission medical/surgical history; reconciled medication list; family history; social history and comprehensive review of systems.  Corrections and additions to the records were documented. Comprehensive physical exam was also performed. Additionally a clinical summary was entered for each active diagnosis pertinent to this admission in the Problem List to enhance continuity of care.  HPI: She was rehospitalized 7/22-7/30/2021, admitted from the SNF for hyponatremia with sodium level of 120.  The patient was essentially asymptomatic except for poor appetite due to dissatisfaction with her dysphagia diet prescribed because of the edentulous state.  It was felt that HYPOvolemia in the context of poor p.o. intake had resulted in the hyponatremia.  Sodium did improve up to 134 with IV fluids.   She was also found to have anemia which was clinically at baseline; however the hemoglobin did trend down to 7.7 with the IV hydration.  She received 1 unit PRBCs with improvement in the hemoglobin to 9.9. Monitor of the CBC was recommended weekly x3. Additional work-up documented extensive left lower extremity DVT for which Eliquis was initiated.  Megace was discontinued.  Dr. Carlis Abbott, on-call provider for vascular surgery felt she was a poor candidate for thrombectomy at this time due to the multiple advanced comorbidities and poor functional status.  Because of the hyponatremia and the DVT, extensive CT imaging was performed to rule out occult malignancy.  None was documented. To enhance nutrition and oral intake; diet was changed to pured.  Remeron was ordered for appetite stimulation. Dr. Leta Baptist requested MRI of the C-spine and T-spine to evaluate her failure to thrive and generalized weakness and ataxia.  These were  negative except for degenerative changes with cervical spinal stenosis with mass-effect on the spinal cord.  Dr. Arnoldo Morale, NS felt she was a high risk for any surgical intervention and such was not urgently needed at this time. Pressure injury was documented 7/22 with stage II lesion of the sacrum.  This was associated with partial thickness loss of the dermis present with shallow open injury with red, pink wound bed without slough.  Past medical and surgical history: Includes essential hypertension, & B12 deficiency.  Surgeries and procedures include cholecystectomy as well as colonoscopies.  Social history: Nondrinker; former 40-pack-year smoker.  Family history: Extensive history reviewed.  Noncontributory due to advanced age.   Review of systems:Date given as February 12, 2020.  She is alert and oriented & is able to give a good history. Her biggest complaint is poor appetite due to dissatisfaction with her diet choices.  She describes her appetite as "here and there", and states that she is eating approximately half of the portions.  She denies any dysphagia or nausea/ vomiting at this time.  She states that she occasionally will have tachycardia.  She also has occasional numbness and tingling in her hands and feet. She admits to some depression because of having to stay at the SNF.  Constitutional: No fever, significant weight change  Eyes: No redness, discharge, pain, vision change ENT/mouth: No nasal congestion, purulent discharge, earache, change in hearing, sore throat  Cardiovascular: No chest pain, paroxysmal nocturnal dyspnea, claudication, edema  Respiratory: No cough, sputum production, hemoptysis, DOE, significant snoring, apnea  Gastrointestinal: No heartburn, abdominal pain, rectal bleeding, melena, change in bowels Genitourinary: No dysuria, hematuria, pyuria, incontinence, nocturia Musculoskeletal: No joint  stiffness, joint swelling, weakness, pain Dermatologic: No rash, pruritus,  change in appearance of skin Neurologic: No dizziness, headache, syncope, seizures Psychiatric: No insomnia Endocrine: No change in hair/skin/nails, excessive thirst, excessive hunger, excessive urination  Hematologic/lymphatic: No significant bruising, lymphadenopathy, abnormal bleeding Allergy/immunology: No itchy/watery eyes, significant sneezing, urticaria, angioedema  Physical exam:  Pertinent or positive findings: She is thin and appears suboptimally nourished.  Lacrimal glands are prominent.  The left nasolabial fold is decreased.  Speech is slightly slurred.  She does drool saliva.  She is completely edentulous.  She has slight tachycardia with increased S1 and S2.  Pedal pulses are decreased.  Heels are dressed.  She has hyperpigmentation over the temples.  The right lower extremity is stronger than the left and there is marked decreased range of motion of the left lower extremity.  Right upper extremity is stronger than the left upper extremity.  She has slight athetoid movement of the hands intermittently.  General appearance: no acute distress, increased work of breathing is present.   Lymphatic: No lymphadenopathy about the head, neck, axilla. Eyes: No conjunctival inflammation or lid edema is present. There is no scleral icterus. Ears:  External ear exam shows no significant lesions or deformities.   Nose:  External nasal examination shows no deformity or inflammation. Nasal mucosa are pink and moist without lesions, exudates Oral exam: Lips and gums are healthy appearing.There is no oropharyngeal erythema or exudate. Neck:  No thyromegaly, masses, tenderness noted.    Heart:  No murmur, click, rub.  Lungs:  without wheezes, rhonchi, rales, rubs. Abdomen: Bowel sounds are normal.  Abdomen is soft and nontender with no organomegaly, hernias, masses. GU: Deferred  Extremities:  No cyanosis, clubbing, edema. Neurologic exam: Balance, Rhomberg, finger to nose testing could not be  completed due to clinical state Skin: Warm & dry w/o tenting. No significant rash.  See clinical summary under each active problem in the Problem List with associated updated therapeutic plan

## 2020-02-14 ENCOUNTER — Other Ambulatory Visit (HOSPITAL_COMMUNITY)
Admission: RE | Admit: 2020-02-14 | Discharge: 2020-02-14 | Disposition: A | Discharge status: Home / Self Care | Payer: Medicare Other | Source: Ambulatory Visit | Attending: Adult Health | Admitting: Adult Health

## 2020-02-14 DIAGNOSIS — R000 Tachycardia, unspecified: Secondary | ICD-10-CM | POA: Insufficient documentation

## 2020-02-14 LAB — T4, FREE: Free T4: 1.8 ng/dL — ABNORMAL HIGH (ref 0.61–1.12)

## 2020-02-14 LAB — TSH: TSH: 6.564 u[IU]/mL — ABNORMAL HIGH (ref 0.350–4.500)

## 2020-02-14 NOTE — Assessment & Plan Note (Addendum)
Continue Eliquis with monitor for bleeding dyscrasias because of baseline anemia

## 2020-02-14 NOTE — Assessment & Plan Note (Addendum)
Current CT reviewed : 3 mm nodule along RML fissure; annual monitor will occur as part of 3.6 cm aortic aneurysm survelliance

## 2020-02-15 ENCOUNTER — Other Ambulatory Visit (HOSPITAL_COMMUNITY): Payer: Medicare Other

## 2020-02-15 ENCOUNTER — Ambulatory Visit (HOSPITAL_COMMUNITY): Payer: Medicare Other

## 2020-02-15 LAB — T3, FREE: T3, Free: 2.2 pg/mL (ref 2.0–4.4)

## 2020-02-15 LAB — T3: T3, Total: 74 ng/dL (ref 71–180)

## 2020-02-15 LAB — T4: T4, Total: 7.8 ug/dL (ref 4.5–12.0)

## 2020-02-15 NOTE — Telephone Encounter (Signed)
Sending this back to be redirected to nurse.

## 2020-02-15 NOTE — Telephone Encounter (Signed)
Please see previous message and call [facility] to determine if they are managing the patient's health care/hospital discharge recommendations. If they are not please get patient scheduled for blood work early next week and have her scheduled weekly for a total of 3 visits. Let me know what you find out so that I can place orders for blood work if needed. Thank you.

## 2020-02-15 NOTE — Telephone Encounter (Signed)
Nurse Chantel states that yes Traci Parker is getting blood work there weekly on Fridays

## 2020-02-15 NOTE — Telephone Encounter (Signed)
Noted! Thank you

## 2020-02-16 ENCOUNTER — Encounter: Payer: Self-pay | Admitting: Internal Medicine

## 2020-02-16 ENCOUNTER — Other Ambulatory Visit (HOSPITAL_COMMUNITY)
Admission: RE | Admit: 2020-02-16 | Discharge: 2020-02-16 | Disposition: A | Discharge status: Home / Self Care | Payer: Medicare Other | Source: Ambulatory Visit | Attending: Internal Medicine | Admitting: Internal Medicine

## 2020-02-16 DIAGNOSIS — N1832 Chronic kidney disease, stage 3b: Secondary | ICD-10-CM | POA: Insufficient documentation

## 2020-02-16 DIAGNOSIS — R946 Abnormal results of thyroid function studies: Secondary | ICD-10-CM | POA: Insufficient documentation

## 2020-02-16 LAB — BASIC METABOLIC PANEL
Anion gap: 10 (ref 5–15)
BUN: 15 mg/dL (ref 8–23)
CO2: 22 mmol/L (ref 22–32)
Calcium: 8.4 mg/dL — ABNORMAL LOW (ref 8.9–10.3)
Chloride: 105 mmol/L (ref 98–111)
Creatinine, Ser: 1.07 mg/dL — ABNORMAL HIGH (ref 0.44–1.00)
GFR calc Af Amer: 59 mL/min — ABNORMAL LOW (ref >60)
GFR calc non Af Amer: 51 mL/min — ABNORMAL LOW (ref >60)
Glucose, Bld: 86 mg/dL (ref 70–99)
Potassium: 3.5 mmol/L (ref 3.5–5.1)
Sodium: 137 mmol/L (ref 135–145)

## 2020-02-16 LAB — CBC
HCT: 30.3 % — ABNORMAL LOW (ref 36.0–46.0)
Hemoglobin: 9.6 g/dL — ABNORMAL LOW (ref 12.0–15.0)
MCH: 25.9 pg — ABNORMAL LOW (ref 26.0–34.0)
MCHC: 31.7 g/dL (ref 30.0–36.0)
MCV: 81.9 fL (ref 80.0–100.0)
Platelets: 312 10*3/uL (ref 150–400)
RBC: 3.7 MIL/uL — ABNORMAL LOW (ref 3.87–5.11)
RDW: 27.3 % — ABNORMAL HIGH (ref 11.5–15.5)
WBC: 5.9 10*3/uL (ref 4.0–10.5)
nRBC: 0 % (ref 0.0–0.2)

## 2020-02-19 ENCOUNTER — Encounter: Payer: Self-pay | Admitting: Adult Health

## 2020-02-19 ENCOUNTER — Non-Acute Institutional Stay (SKILLED_NURSING_FACILITY): Payer: Medicare Other | Admitting: Adult Health

## 2020-02-19 DIAGNOSIS — I824Y2 Acute embolism and thrombosis of unspecified deep veins of left proximal lower extremity: Secondary | ICD-10-CM

## 2020-02-19 DIAGNOSIS — J449 Chronic obstructive pulmonary disease, unspecified: Secondary | ICD-10-CM

## 2020-02-19 DIAGNOSIS — G6289 Other specified polyneuropathies: Secondary | ICD-10-CM

## 2020-02-19 NOTE — Progress Notes (Signed)
Location:    [place] Room Number: 148/W Place of Service:  SNF (31)   CODE STATUS: Full Code  Allergies  Allergen Reactions  . Latex Rash  . Levofloxacin Nausea And Vomiting  . Neomycin-Bacitracin-Polymyxin  [Bacitracin-Neomycin-Polymyxin] Rash  . Other Rash and Swelling  . Prednisone Hives, Itching, Rash and Other (See Comments)  . Fish Allergy     rash  . Neosporin [Neomycin-Polymyxin-Gramicidin] Itching and Rash    Chief Complaint  Patient presents with  . Short Term Rehab (STR)          Acute deep vein thrombosis (DVT) of proximal vein of left lower extremity:  Chronic obstructive pulmonary disease unspecified COPD type:    Peripheral neuropathy:   Weekly follow up for the first 30 days post hospitalization.     HPI:  She is a 74 year old short term rehab patient being seen for the management of her chronic illnesses: Acute deep vein thrombosis (DVT) of proximal vein of left lower extremity:     Chronic obstructive pulmonary disease unspecified COPD type: Peripheral neuropathy. There are no reports of uncontrolled pain; no changes in her appetite; her weight is 113 pounds. She has difficulty with fine motor tasks such as dialing a phone. No cough or shortness of breath.   Past Medical History:  Diagnosis Date  . Allergy   . Anemia    pernicious anemia  . Asthma   . Blood transfusion without reported diagnosis   . CKD (chronic kidney disease)   . COPD (chronic obstructive pulmonary disease) ([place]) 12/23/2019  . Gastritis   . Glaucoma   . History of blood in urine   . Hyperlipidemia   . Hypertension   . Ocular hypertension   . Osteoporosis   . Type 2 diabetes mellitus ([place]) 12/23/2019  . Vitamin B12 deficiency anemia due to intrinsic factor deficiency     Past Surgical History:  Procedure Laterality Date  . CHOLECYSTECTOMY  2008  . COLONOSCOPY  04/30/2011   Procedure: COLONOSCOPY;  Surgeon: Rogene Houston, MD;  [facility];   Service: Endoscopy;  Laterality: N/A;  . COLONOSCOPY  05/25/2012   Procedure: COLONOSCOPY;  Surgeon: Rogene Houston, MD;  [facility];  Service: Endoscopy;  Laterality: N/A;  1:25-changed to 1200 Ann to notify pt  . COLONOSCOPY Bilateral 12/2017  . ESOPHAGOGASTRODUODENOSCOPY N/A 12/07/2019   Procedure: ESOPHAGOGASTRODUODENOSCOPY (EGD);  Surgeon: Rogene Houston, MD;  Location: [facility];  Service: Endoscopy;  Laterality: N/A;  155  . GALLBLADDER SURGERY    . right breast cystectomy  1988  . TUBAL LIGATION  1975    Social History   Socioeconomic History  . Marital status: Divorced    Spouse name: Not on file  . Number of children: 4  . Years of education: 9  . Highest education level: Not on file  Occupational History  . Not on file  Tobacco Use  . Smoking status: Former Smoker    Packs/day: 1.00    Years: 40.00    Pack years: 40.00    Quit date: 04/03/2011    Years since quitting: 8.8  . Smokeless tobacco: Never Used  Vaping Use  . Vaping Use: Never used  Substance and Sexual Activity  . Alcohol use: No  . Drug use: No  . Sexual activity: Not Currently  Other Topics Concern  . Not on file  Social History Narrative   01/17[birth year] lives at [facility], [place]  Social Determinants of Health   Financial Resource Strain:   . Difficulty of Paying Living Expenses:   Food Insecurity:   . Worried About Charity fundraiser in the Last Year:   . Arboriculturist in the Last Year:   Transportation Needs:   . Film/video editor (Medical):   Marland Kitchen Lack of Transportation (Non-Medical):   Physical Activity:   . Days of Exercise per Week:   . Minutes of Exercise per Session:   Stress:   . Feeling of Stress :   Social Connections:   . Frequency of Communication with Friends and Family:   . Frequency of Social Gatherings with Friends and Family:   . Attends Religious Services:   . Active Member of Clubs or Organizations:   . Attends Archivist  Meetings:   Marland Kitchen Marital Status:   Intimate Partner Violence:   . Fear of Current or Ex-Partner:   . Emotionally Abused:   Marland Kitchen Physically Abused:   . Sexually Abused:    Family History  Problem Relation Age of Onset  . Diabetes Mother   . Hyperlipidemia Mother   . Hypertension Mother   . Heart disease Father   . Hyperlipidemia Father   . Diabetes Sister   . Hyperlipidemia Sister   . Hypertension Sister   . Cancer Brother   . Heart disease Brother   . Hyperlipidemia Brother   . Hypertension Brother   . Diabetes Sister       VITAL SIGNS BP 125/63   Pulse 71   Temp 98.7 F (37.1 C) (Oral)   Resp 20   Ht 5\' 4"  (1.626 m)   Wt 113 lb 12.8 oz (51.6 kg)   SpO2 96%   BMI 19.53 kg/m   Outpatient Encounter Medications as of 02/19/2020  Medication Sig  . acetaminophen (TYLENOL) 325 MG tablet Take 2 tablets (650 mg total) by mouth every 6 (six) hours as needed for mild pain (or Fever >/= 101).  Marland Kitchen albuterol (VENTOLIN HFA) 108 (90 Base) MCG/ACT inhaler Inhale 2 puffs into the lungs every 4 (four) hours as needed for wheezing or shortness of breath.  . ALPRAZolam (XANAX) 0.25 MG tablet Take 1 tablet (0.25 mg total) by mouth 2 (two) times daily as needed for anxiety.  Marland Kitchen amLODipine (NORVASC) 10 MG tablet Take 1 tablet (10 mg total) by mouth daily.  Marland Kitchen apixaban (ELIQUIS) 5 MG TABS tablet Take 1 tablet (5 mg total) by mouth 2 (two) times daily.  Roseanne Kaufman Peru-Castor Oil (VENELEX) OINT Apply topically 3 (three) times daily. Special Instructions: Apply to sacrum and bilateral buttocks qshift for prevention.  . cyanocobalamin (,VITAMIN B-12,) 1000 MCG/ML injection Inject 1 mL (1,000 mcg total) into the muscle every 30 (thirty) days.  . dorzolamide (TRUSOPT) 2 % ophthalmic solution Place 1 drop into both eyes 2 (two) times daily.  . feeding supplement, ENSURE ENLIVE, (ENSURE ENLIVE) LIQD Take 237 mLs by mouth 2 (two) times daily between meals.  . folic acid (FOLVITE) 1 MG tablet Take 1 mg by mouth  daily.   Marland Kitchen gabapentin (NEURONTIN) 100 MG capsule Take 100 mg by mouth at bedtime.  Marland Kitchen latanoprost (XALATAN) 0.005 % ophthalmic solution Place 1 drop into both eyes at bedtime.  . metoCLOPramide (REGLAN) 5 MG tablet Take 1 tablet (5 mg total) by mouth 3 (three) times daily before meals.  . mirtazapine (REMERON) 15 MG tablet Take 0.5 tablets (7.5 mg total) by mouth at bedtime.  . NON FORMULARY  Dys 2 (ground meats), add extra gravy to meats ALLERGIC TO FISH  . omeprazole (PRILOSEC) 40 MG capsule Take 40 mg by mouth daily.   . ondansetron (ZOFRAN ODT) 4 MG disintegrating tablet Take 1 tablet (4 mg total) by mouth every 8 (eight) hours as needed for nausea or vomiting. Dissolve under tongue   No facility-administered encounter medications on file as of 02/19/2020.     SIGNIFICANT DIAGNOSTIC EXAMS   PREVIOUS   12-23-19: chest x-ray: No active disease.   12-23-19: ct angio of chest:  1. No evidence of pulmonary embolism. 2. Paraseptal emphysema, mild and worse at the lung apices. 3. 3 mm nodule along the fissure in the RIGHT lower lobe. . 4. Moderate and with 3.6 cm caliber of ascending thoracic aorta.. Aortic aneurysm  5. Emphysema and aortic atherosclerosis  02-02-20: chest x-ray: COPD changes without acute infiltrate   02-02-20: bilateral lower extremity venous ultrasound:  Extensive acute appearing left femoropopliteal occlusive DVT extending into the calf veins. Negative for right lower extremity DVT  02-05-20: MRI cervical spine:  1. Widespread cervical spine degeneration. No acute osseous abnormality. 2. Multilevel mild to moderate spinal stenosis and spinal cord mass effect at C3-C4 through C6-C7, with possible spinal cord myelomalacia at the C6 level. No other cord signal abnormality. 3. Moderate or severe degenerative neural foraminal stenosis at the bilateral C5 and C6 nerve levels.   02-05-20: MRI thoracic spine:  Normal for age MRI appearance of the thoracic spine   02-06-20:  ct of chest abdomen and pelvis:  1. No definite evidence of malignancy in the chest, abdomen, or pelvis. 2. Unchanged small bilateral lung nodules measuring up to 3 mm. Non-contrast chest CT can be considered in 12 months.  3. Large stool burden with rectal distension and presacral inflammation which may reflect stercoral proctitis. 4. Known left lower extremity DVT. 5. Nonobstructing nephrolithiasis. 6. Aortic Atherosclerosis and Emphysema .  NO NEW EXAMS.     LABS REVIEWED PREVIOUS   12-23-19: wbc 8.2; hgb 7.3; hct 25.2; mcv 58.3 plt 220; glucose 114; bun 15; creat 1.46; k+ 3.2; na++ 137 ca 9.4 liver normal albumin 4.1 d-dimer 0.86; mag 1.5; phos 3.3; urine culture: e-coli 12-25-19: wbc 8.2; hgb 7.1; hct 23.5; mcv 57.6 plt 215; glucose 111; bun 8; creat 1.08; k+ 4.2; na++ 135 ca 8.8; mag 1.6 12-26-19: wbc 7.9; hgb 9.5; hct 30.0 mcv 63.6 plt 205; glucose 110; bun 13; creat 1.21; k+ 4.1; na++ 136; ca 9.2 phos 3.4 albumin 3.6  01-01-20: wbc 10.8; hgb 9.9; hct 32.5; mcv 65.4 plt 282; glucose 99; bun 21; creat 1.41; k+ 3.9; na++ 133;ca 9.1 mag 1.9 free t4: 1.37 free t3: 2.1 tsh 7.150 01-08-20: ANA neg; CRP 15.6 01-09-20: wbc 8.2; hgb 8.2; hct 25.9; mcv 66.6 plt 238;  01-10-20: wbc 10.2; hgb 8.9; hct 28.2; mcv 67.6 plt 290; gucose 117; bun 25; creat 1.59; k+ 4.2; na++ 138; ca 9.2; live normal albumin 3.4 mag 1.9 RPR neg 02-01-20: wbc 10.6; hgb 28.1; mcv 74.1 plt 271; glucose 140 bun 27; creat 1.28; k+ 4.4; an++ 120; ca 8.9 liver normal albumin 3.4 mag 1.9 02-04-20: wbc 4.7; hgb 9.2; hct 28.7; mcv 78.2 plt 273 02-05-20: wbc 5.0; hgb 9.4; hct 29.2; mcv 78.3 plt 270; glucose 95; bun 19; creat 0.90; k+ 3.5; na++ 135; ca 8.6 liver normal albumin 2.6 02-08-20; glucose 85; bun 13; creat 0.91; k+ 3.6; an++ 134; ca 8.6  TODAY  02-14-20: tsh 6.564 free T3: 2.2 free T4: 1.80  02-16-20: wbc 5.9; hgb 9.6; hct 30.3; mcv 81.9 plt 312; glucose 86; bun 15; creat 1.07; k+ 3.5; na++ 137; ca 8.4   Review of Systems    Constitutional: Negative for malaise/fatigue.  Respiratory: Negative for cough and shortness of breath.   Cardiovascular: Negative for chest pain, palpitations and leg swelling.  Gastrointestinal: Negative for abdominal pain, constipation and heartburn.  Musculoskeletal: Negative for back pain, joint pain and myalgias.  Skin: Negative.   Neurological: Positive for focal weakness and weakness. Negative for dizziness.  Psychiatric/Behavioral: The patient is not nervous/anxious.    Physical Exam Constitutional:      General: She is not in acute distress.    Appearance: She is well-developed. She is not diaphoretic.  Neck:     Thyroid: No thyromegaly.  Cardiovascular:     Rate and Rhythm: Normal rate and regular rhythm.     Pulses: Normal pulses.     Heart sounds: Normal heart sounds.  Pulmonary:     Effort: Pulmonary effort is normal. No respiratory distress.     Breath sounds: Normal breath sounds.  Abdominal:     General: Bowel sounds are normal. There is no distension.     Palpations: Abdomen is soft.     Tenderness: There is no abdominal tenderness.  Musculoskeletal:     Cervical back: Neck supple.     Right lower leg: No edema.     Left lower leg: No edema.     Comments: Is able to move all extremities  Has poor fine motor abilities.   Lymphadenopathy:     Cervical: No cervical adenopathy.  Skin:    General: Skin is warm and dry.  Neurological:     Mental Status: She is alert and oriented to person, place, and time.  Psychiatric:        Mood and Affect: Mood normal.     ASSESSMENT/ PLAN:  TODAY  1. Acute deep vein thrombosis (DVT) of proximal vein of left lower extremity: diagnosed 02-02-20 will continue eliquis 5 mg twice daily   2. Chronic obstructive pulmonary disease unspecified COPD type: is stable will continue albuterol 2 puffs every 4 hours as needed  3. Peripheral neuropathy: is stable will continue gabapentin 100 mg nightly   PREVIOUS   4. Ataxia:  is without change in status will monitor   5. CKD stage 3 due to diabetes melltus type 2 is stable bun 15; creat 1.07  6. Type 2 diabetes mellitus with stage 2 chronic kidney disease without long term current use of insulin will monitor   7. Other chronic gastritis without hemorrhage is stable will continue prilosec 40 mg daily reglan 5 mg three times daily    8. Hypertension associated with stage 2 chronic kidney disease due to type 2 diabetes mellitus: is stable b/p 104/68 will continue norvasc 10 mg daily   9. Anemia due to chronic renal failure treated with erythropoietin stage 3: is stable hgb 9.4; will continue therapy per hematology    10. Increased intraocular pressure bilateral: is stable will continue trusopt to both eyes twice daily and xalatan to eye nightly   11. Vit B 12: deficiency: is stable will continue monthly b12 injections and folic acid 1 mg daily   12. Failure to thrive in adult is without change will continue remeron 7.5 mg nightly is off megace due to dvt.   13. Aortic atherosclerosis: is stable will monitor     MD is aware of resident's narcotic use and is in  agreement with current plan of care. We will attempt to wean resident as appropriate.  Ok Edwards NP [facility]  Contact [phone] Monday through Friday 8am- 5pm  After hours call [phone]

## 2020-02-21 ENCOUNTER — Inpatient Hospital Stay (HOSPITAL_COMMUNITY): Payer: Medicare Other

## 2020-02-21 ENCOUNTER — Inpatient Hospital Stay (HOSPITAL_COMMUNITY): Payer: Medicare Other | Attending: Hematology

## 2020-02-21 ENCOUNTER — Encounter (HOSPITAL_COMMUNITY): Payer: Self-pay

## 2020-02-21 ENCOUNTER — Other Ambulatory Visit: Payer: Self-pay

## 2020-02-21 VITALS — BP 117/70 | HR 110 | Temp 97.8°F | Resp 16

## 2020-02-21 DIAGNOSIS — D509 Iron deficiency anemia, unspecified: Secondary | ICD-10-CM

## 2020-02-21 DIAGNOSIS — N189 Chronic kidney disease, unspecified: Secondary | ICD-10-CM | POA: Insufficient documentation

## 2020-02-21 DIAGNOSIS — E860 Dehydration: Secondary | ICD-10-CM

## 2020-02-21 DIAGNOSIS — D631 Anemia in chronic kidney disease: Secondary | ICD-10-CM | POA: Insufficient documentation

## 2020-02-21 LAB — CBC
HCT: 32.2 % — ABNORMAL LOW (ref 36.0–46.0)
Hemoglobin: 10.3 g/dL — ABNORMAL LOW (ref 12.0–15.0)
MCH: 26.5 pg (ref 26.0–34.0)
MCHC: 32 g/dL (ref 30.0–36.0)
MCV: 82.8 fL (ref 80.0–100.0)
Platelets: 290 10*3/uL (ref 150–400)
RBC: 3.89 MIL/uL (ref 3.87–5.11)
RDW: 25.8 % — ABNORMAL HIGH (ref 11.5–15.5)
WBC: 9.2 10*3/uL (ref 4.0–10.5)
nRBC: 0 % (ref 0.0–0.2)

## 2020-02-21 MED ORDER — EPOETIN ALFA-EPBX 10000 UNIT/ML IJ SOLN
10000.0000 [IU] | Freq: Once | INTRAMUSCULAR | Status: AC
  Administered 2020-02-21: 10000 [IU] via SUBCUTANEOUS
  Filled 2020-02-21: qty 1

## 2020-02-21 NOTE — Progress Notes (Signed)
Patient tolerated Retacrit injection with no complaints voiced.  Site clean and dry with no bruising or swelling noted at site.  Band aid applied.  VSS with discharge and left in a wheelchair with no s/s of distress noted.

## 2020-02-23 ENCOUNTER — Encounter (HOSPITAL_COMMUNITY)
Admission: RE | Admit: 2020-02-23 | Discharge: 2020-02-23 | Disposition: A | Discharge status: Home / Self Care | Payer: Medicare Other | Source: Ambulatory Visit | Attending: Nephrology | Admitting: Nephrology

## 2020-02-23 DIAGNOSIS — D509 Iron deficiency anemia, unspecified: Secondary | ICD-10-CM | POA: Insufficient documentation

## 2020-02-23 LAB — BASIC METABOLIC PANEL
Anion gap: 9 (ref 5–15)
BUN: 18 mg/dL (ref 8–23)
CO2: 22 mmol/L (ref 22–32)
Calcium: 8.8 mg/dL — ABNORMAL LOW (ref 8.9–10.3)
Chloride: 106 mmol/L (ref 98–111)
Creatinine, Ser: 1.15 mg/dL — ABNORMAL HIGH (ref 0.44–1.00)
GFR calc Af Amer: 54 mL/min — ABNORMAL LOW (ref >60)
GFR calc non Af Amer: 47 mL/min — ABNORMAL LOW (ref >60)
Glucose, Bld: 99 mg/dL (ref 70–99)
Potassium: 3.4 mmol/L — ABNORMAL LOW (ref 3.5–5.1)
Sodium: 137 mmol/L (ref 135–145)

## 2020-02-23 LAB — CBC
HCT: 30.7 % — ABNORMAL LOW (ref 36.0–46.0)
Hemoglobin: 9.6 g/dL — ABNORMAL LOW (ref 12.0–15.0)
MCH: 26.4 pg (ref 26.0–34.0)
MCHC: 31.3 g/dL (ref 30.0–36.0)
MCV: 84.3 fL (ref 80.0–100.0)
Platelets: 232 10*3/uL (ref 150–400)
RBC: 3.64 MIL/uL — ABNORMAL LOW (ref 3.87–5.11)
RDW: 25.6 % — ABNORMAL HIGH (ref 11.5–15.5)
WBC: 5.8 10*3/uL (ref 4.0–10.5)
nRBC: 0 % (ref 0.0–0.2)

## 2020-02-29 ENCOUNTER — Encounter: Payer: Medicare Other | Admitting: Diagnostic Neuroimaging

## 2020-02-29 ENCOUNTER — Non-Acute Institutional Stay (SKILLED_NURSING_FACILITY): Payer: Medicare Other | Admitting: Adult Health

## 2020-02-29 ENCOUNTER — Encounter: Payer: Self-pay | Admitting: Adult Health

## 2020-02-29 DIAGNOSIS — N183 Chronic kidney disease, stage 3 unspecified: Secondary | ICD-10-CM | POA: Diagnosis not present

## 2020-02-29 DIAGNOSIS — E1122 Type 2 diabetes mellitus with diabetic chronic kidney disease: Secondary | ICD-10-CM

## 2020-02-29 DIAGNOSIS — I824Y2 Acute embolism and thrombosis of unspecified deep veins of left proximal lower extremity: Secondary | ICD-10-CM | POA: Diagnosis not present

## 2020-02-29 DIAGNOSIS — I700 Atherosclerosis of aorta: Secondary | ICD-10-CM | POA: Diagnosis not present

## 2020-02-29 NOTE — Progress Notes (Signed)
Location:    [facility] Room Number: 150/P Place of Service:  SNF (31)   CODE STATUS: Full Code  Allergies  Allergen Reactions  . Latex Rash  . Levofloxacin Nausea And Vomiting  . Neomycin-Bacitracin-Polymyxin  [Bacitracin-Neomycin-Polymyxin] Rash  . Other Rash and Swelling  . Prednisone Hives, Itching, Rash and Other (See Comments)  . Fish Allergy     rash  . Neosporin [Neomycin-Polymyxin-Gramicidin] Itching and Rash    Chief Complaint  Patient presents with  . Acute Visit    Care Plan Meeting    HPI:  [name] BIMS 13/15 mood 3/30. Her appetite is better her current weight is 109 pounds. She is total care with her adls and requires assistance with meals.she is incontinent of bladder and bowel. She continues to have extremity weakness; no uncontrolled pain; no anxiety or depressive thoughts. She continues to be followed for her chronic illnesses including: Acute deep vein thrombosis (DVT) of proximal vein of left lower leg   Aortic atherosclerosis  CKD stage 3 due to type 2 diabetes mellitus:   Past Medical History:  Diagnosis Date  . Allergy   . Anemia    pernicious anemia  . Asthma   . Blood transfusion without reported diagnosis   . CKD (chronic kidney disease)   . COPD (chronic obstructive pulmonary disease) ([place]) 12/23/2019  . Gastritis   . Glaucoma   . History of blood in urine   . Hyperlipidemia   . Hypertension   . Ocular hypertension   . Osteoporosis   . Type 2 diabetes mellitus ([place]) 12/23/2019  . Vitamin B12 deficiency anemia due to intrinsic factor deficiency     Past Surgical History:  Procedure Laterality Date  . CHOLECYSTECTOMY  2008  . COLONOSCOPY  04/30/2011   Procedure: COLONOSCOPY;  Surgeon: Rogene Houston, MD;  Location: [facility];  Service: Endoscopy;  Laterality: N/A;  . COLONOSCOPY  05/25/2012   Procedure: COLONOSCOPY;  Surgeon: Rogene Houston, MD;   Location: [facility];  Service: Endoscopy;  Laterality: N/A;  1:25-changed to 1200 Ann to notify pt  . COLONOSCOPY Bilateral 12/2017  . ESOPHAGOGASTRODUODENOSCOPY N/A 12/07/2019   Procedure: ESOPHAGOGASTRODUODENOSCOPY (EGD);  Surgeon: Rogene Houston, MD;  Location: [facility];  Service: Endoscopy;  Laterality: N/A;  155  . GALLBLADDER SURGERY    . right breast cystectomy  1988  . TUBAL LIGATION  1975    Social History   Socioeconomic History  . Marital status: Divorced    Spouse name: Not on file  . Number of children: 4  . Years of education: 9  . Highest education level: Not on file  Occupational History  . Not on file  Tobacco Use  . Smoking status: Former Smoker    Packs/day: 1.00    Years: 40.00    Pack years: 40.00    Quit date: 04/03/2011    Years since quitting: 8.9  . Smokeless tobacco: Never Used  Vaping Use  . Vaping Use: Never used  Substance and Sexual Activity  . Alcohol use: No  . Drug use: No  . Sexual activity: Not Currently  Other Topics Concern  . Not on file  Social History Narrative   [birth year] lives at [facility], [place]   Social Determinants of Health   Financial Resource Strain:   . Difficulty of Paying Living Expenses: Not on file  Food Insecurity:   . Worried About  Running Out of Food in the Last Year: Not on file  . Ran Out of Food in the Last Year: Not on file  Transportation Needs:   . Lack of Transportation (Medical): Not on file  . Lack of Transportation (Non-Medical): Not on file  Physical Activity:   . Days of Exercise per Week: Not on file  . Minutes of Exercise per Session: Not on file  Stress:   . Feeling of Stress : Not on file  Social Connections:   . Frequency of Communication with Friends and Family: Not on file  . Frequency of Social Gatherings with Friends and Family: Not on file  . Attends Religious Services: Not on file  . Active Member of Clubs or Organizations: Not on file  . Attends Archivist  Meetings: Not on file  . Marital Status: Not on file  Intimate Partner Violence:   . Fear of Current or Ex-Partner: Not on file  . Emotionally Abused: Not on file  . Physically Abused: Not on file  . Sexually Abused: Not on file   Family History  Problem Relation Age of Onset  . Diabetes Mother   . Hyperlipidemia Mother   . Hypertension Mother   . Heart disease Father   . Hyperlipidemia Father   . Diabetes Sister   . Hyperlipidemia Sister   . Hypertension Sister   . Cancer Brother   . Heart disease Brother   . Hyperlipidemia Brother   . Hypertension Brother   . Diabetes Sister       VITAL SIGNS BP 121/70   Pulse 72   Temp (!) 97 F (36.1 C) (Oral)   Resp 20   Ht 5\' 4"  (1.626 m)   Wt 113 lb 12.8 oz (51.6 kg)   SpO2 93%   BMI 19.53 kg/m   Outpatient Encounter Medications as of 02/29/2020  Medication Sig  . acetaminophen (TYLENOL) 325 MG tablet Take 2 tablets (650 mg total) by mouth every 6 (six) hours as needed for mild pain (or Fever >/= 101).  Marland Kitchen albuterol (VENTOLIN HFA) 108 (90 Base) MCG/ACT inhaler Inhale 2 puffs into the lungs every 4 (four) hours as needed for wheezing or shortness of breath.  Marland Kitchen amLODipine (NORVASC) 10 MG tablet Take 1 tablet (10 mg total) by mouth daily.  Marland Kitchen apixaban (ELIQUIS) 5 MG TABS tablet Take 1 tablet (5 mg total) by mouth 2 (two) times daily.  Roseanne Kaufman [place]-Castor Oil (VENELEX) OINT Apply topically 3 (three) times daily. Special Instructions: Apply to sacrum and bilateral buttocks qshift for prevention.  . cyanocobalamin (,VITAMIN B-12,) 1000 MCG/ML injection Inject 1 mL (1,000 mcg total) into the muscle every 30 (thirty) days.  . dorzolamide (TRUSOPT) 2 % ophthalmic solution Place 1 drop into both eyes 2 (two) times daily.  . feeding supplement, ENSURE ENLIVE, (ENSURE ENLIVE) LIQD Take 237 mLs by mouth 2 (two) times daily between meals.  . folic acid (FOLVITE) 1 MG tablet Take 1 mg by mouth daily.   Marland Kitchen gabapentin (NEURONTIN) 100 MG capsule  Take 100 mg by mouth at bedtime.  Marland Kitchen latanoprost (XALATAN) 0.005 % ophthalmic solution Place 1 drop into both eyes at bedtime.  . metoCLOPramide (REGLAN) 5 MG tablet Take 1 tablet (5 mg total) by mouth 3 (three) times daily before meals.  . mirtazapine (REMERON) 15 MG tablet Take 0.5 tablets (7.5 mg total) by mouth at bedtime.  . NON FORMULARY Dys 2 (ground meats), add extra gravy to meats ALLERGIC TO FISH  .  omeprazole (PRILOSEC) 40 MG capsule Take 40 mg by mouth daily.   . ondansetron (ZOFRAN ODT) 4 MG disintegrating tablet Take 1 tablet (4 mg total) by mouth every 8 (eight) hours as needed for nausea or vomiting. Dissolve under tongue  . [DISCONTINUED] ALPRAZolam (XANAX) 0.25 MG tablet Take 1 tablet (0.25 mg total) by mouth 2 (two) times daily as needed for anxiety.   No facility-administered encounter medications on file as of 02/29/2020.     SIGNIFICANT DIAGNOSTIC EXAMS   PREVIOUS   12-23-19: chest x-ray: No active disease.   12-23-19: ct angio of chest:  1. No evidence of pulmonary embolism. 2. Paraseptal emphysema, mild and worse at the lung apices. 3. 3 mm nodule along the fissure in the RIGHT lower lobe. . 4. Moderate and with 3.6 cm caliber of ascending thoracic aorta.. Aortic aneurysm  5. Emphysema and aortic atherosclerosis  02-02-20: chest x-ray: COPD changes without acute infiltrate   02-02-20: bilateral lower extremity venous ultrasound:  Extensive acute appearing left femoropopliteal occlusive DVT extending into the calf veins. Negative for right lower extremity DVT  02-05-20: MRI cervical spine:  1. Widespread cervical spine degeneration. No acute osseous abnormality. 2. Multilevel mild to moderate spinal stenosis and spinal cord mass effect at C3-C4 through C6-C7, with possible spinal cord myelomalacia at the C6 level. No other cord signal abnormality. 3. Moderate or severe degenerative neural foraminal stenosis at the bilateral C5 and C6 nerve levels.   02-05-20:  MRI thoracic spine:  Normal for age MRI appearance of the thoracic spine   02-06-20: ct of chest abdomen and pelvis:  1. No definite evidence of malignancy in the chest, abdomen, or pelvis. 2. Unchanged small bilateral lung nodules measuring up to 3 mm. Non-contrast chest CT can be considered in 12 months.  3. Large stool burden with rectal distension and presacral inflammation which may reflect stercoral proctitis. 4. Known left lower extremity DVT. 5. Nonobstructing nephrolithiasis. 6. Aortic Atherosclerosis and Emphysema .  NO NEW EXAMS.     LABS REVIEWED PREVIOUS   12-23-19: wbc 8.2; hgb 7.3; hct 25.2; mcv 58.3 plt 220; glucose 114; bun 15; creat 1.46; k+ 3.2; na++ 137 ca 9.4 liver normal albumin 4.1 d-dimer 0.86; mag 1.5; phos 3.3; urine culture: e-coli 12-25-19: wbc 8.2; hgb 7.1; hct 23.5; mcv 57.6 plt 215; glucose 111; bun 8; creat 1.08; k+ 4.2; na++ 135 ca 8.8; mag 1.6 12-26-19: wbc 7.9; hgb 9.5; hct 30.0 mcv 63.6 plt 205; glucose 110; bun 13; creat 1.21; k+ 4.1; na++ 136; ca 9.2 phos 3.4 albumin 3.6  01-01-20: wbc 10.8; hgb 9.9; hct 32.5; mcv 65.4 plt 282; glucose 99; bun 21; creat 1.41; k+ 3.9; na++ 133;ca 9.1 mag 1.9 free t4: 1.37 free t3: 2.1 tsh 7.150 01-08-20: ANA neg; CRP 15.6 01-09-20: wbc 8.2; hgb 8.2; hct 25.9; mcv 66.6 plt 238;  01-10-20: wbc 10.2; hgb 8.9; hct 28.2; mcv 67.6 plt 290; gucose 117; bun 25; creat 1.59; k+ 4.2; na++ 138; ca 9.2; live normal albumin 3.4 mag 1.9 RPR neg 02-01-20: wbc 10.6; hgb 28.1; mcv 74.1 plt 271; glucose 140 bun 27; creat 1.28; k+ 4.4; an++ 120; ca 8.9 liver normal albumin 3.4 mag 1.9 02-04-20: wbc 4.7; hgb 9.2; hct 28.7; mcv 78.2 plt 273 02-05-20: wbc 5.0; hgb 9.4; hct 29.2; mcv 78.3 plt 270; glucose 95; bun 19; creat 0.90; k+ 3.5; na++ 135; ca 8.6 liver normal albumin 2.6 02-08-20; glucose 85; bun 13; creat 0.91; k+ 3.6; an++ 134; ca 8.6 02-14-20: tsh  6.564 free T3: 2.2 free T4: 1.80 02-16-20: wbc 5.9; hgb 9.6; hct 30.3; mcv 81.9 plt 312; glucose 86;  bun 15; creat 1.07; k+ 3.5; na++ 137; ca 8.4   NO NEW LABS.   Review of Systems  Constitutional: Negative for malaise/fatigue.  Respiratory: Negative for cough and shortness of breath.   Cardiovascular: Negative for chest pain, palpitations and leg swelling.  Gastrointestinal: Negative for abdominal pain, constipation and heartburn.  Musculoskeletal: Negative for back pain, joint pain and myalgias.  Skin: Negative.   Neurological: Positive for focal weakness and weakness. Negative for dizziness.  Psychiatric/Behavioral: The patient is not nervous/anxious.     Physical Exam Constitutional:      General: She is not in acute distress.    Appearance: She is well-developed. She is not diaphoretic.  Neck:     Thyroid: No thyromegaly.  Cardiovascular:     Rate and Rhythm: Normal rate and regular rhythm.     Pulses: Normal pulses.     Heart sounds: Normal heart sounds.  Pulmonary:     Effort: Pulmonary effort is normal. No respiratory distress.     Breath sounds: Normal breath sounds.  Abdominal:     General: Bowel sounds are normal. There is no distension.     Palpations: Abdomen is soft.     Tenderness: There is no abdominal tenderness.  Musculoskeletal:        General: Normal range of motion.     Cervical back: Neck supple.     Right lower leg: No edema.     Left lower leg: No edema.     Comments:  Is able to move all extremities  Has poor fine motor abilities.   Lymphadenopathy:     Cervical: No cervical adenopathy.  Skin:    General: Skin is warm and dry.  Neurological:     Mental Status: She is alert and oriented to person, place, and time.  Psychiatric:        Mood and Affect: Mood normal.        ASSESSMENT/ PLAN:  TODAY  1. Acute deep vein thrombosis (DVT) of proximal vein of left lower leg  2. Aortic atherosclerosis 3.  CKD stage 3 due to type 2 diabetes mellitus:   Will continue current medications Will continue current plan of cafe Will continue to  monitor her status.    MD is aware of resident's narcotic use and is in agreement with current plan of care. We will attempt to wean resident as appropriate.  Ok Edwards NP [facility]  Contact [phone] Monday through Friday 8am- 5pm  After hours call [phone]

## 2020-03-01 ENCOUNTER — Other Ambulatory Visit (HOSPITAL_COMMUNITY)
Admission: RE | Admit: 2020-03-01 | Discharge: 2020-03-01 | Disposition: A | Discharge status: Home / Self Care | Payer: Medicare Other | Source: Skilled Nursing Facility | Attending: Internal Medicine | Admitting: Internal Medicine

## 2020-03-01 DIAGNOSIS — D509 Iron deficiency anemia, unspecified: Secondary | ICD-10-CM | POA: Diagnosis not present

## 2020-03-01 DIAGNOSIS — Z1159 Encounter for screening for other viral diseases: Secondary | ICD-10-CM | POA: Diagnosis not present

## 2020-03-01 DIAGNOSIS — N1832 Chronic kidney disease, stage 3b: Secondary | ICD-10-CM | POA: Insufficient documentation

## 2020-03-01 DIAGNOSIS — I82402 Acute embolism and thrombosis of unspecified deep veins of left lower extremity: Secondary | ICD-10-CM | POA: Diagnosis not present

## 2020-03-01 LAB — CBC
HCT: 31.8 % — ABNORMAL LOW (ref 36.0–46.0)
Hemoglobin: 10.1 g/dL — ABNORMAL LOW (ref 12.0–15.0)
MCH: 27.5 pg (ref 26.0–34.0)
MCHC: 31.8 g/dL (ref 30.0–36.0)
MCV: 86.6 fL (ref 80.0–100.0)
Platelets: 195 10*3/uL (ref 150–400)
RBC: 3.67 MIL/uL — ABNORMAL LOW (ref 3.87–5.11)
RDW: 23.5 % — ABNORMAL HIGH (ref 11.5–15.5)
WBC: 7.3 10*3/uL (ref 4.0–10.5)
nRBC: 0 % (ref 0.0–0.2)

## 2020-03-01 LAB — BASIC METABOLIC PANEL
Anion gap: 8 (ref 5–15)
BUN: 16 mg/dL (ref 8–23)
CO2: 23 mmol/L (ref 22–32)
Calcium: 8.8 mg/dL — ABNORMAL LOW (ref 8.9–10.3)
Chloride: 106 mmol/L (ref 98–111)
Creatinine, Ser: 1.08 mg/dL — ABNORMAL HIGH (ref 0.44–1.00)
GFR calc Af Amer: 58 mL/min — ABNORMAL LOW (ref >60)
GFR calc non Af Amer: 50 mL/min — ABNORMAL LOW (ref >60)
Glucose, Bld: 100 mg/dL — ABNORMAL HIGH (ref 70–99)
Potassium: 3.5 mmol/L (ref 3.5–5.1)
Sodium: 137 mmol/L (ref 135–145)

## 2020-03-04 DIAGNOSIS — I82402 Acute embolism and thrombosis of unspecified deep veins of left lower extremity: Secondary | ICD-10-CM | POA: Diagnosis not present

## 2020-03-04 DIAGNOSIS — Z1159 Encounter for screening for other viral diseases: Secondary | ICD-10-CM | POA: Diagnosis not present

## 2020-03-06 ENCOUNTER — Inpatient Hospital Stay (HOSPITAL_COMMUNITY): Payer: Medicare Other

## 2020-03-06 ENCOUNTER — Encounter (HOSPITAL_COMMUNITY): Payer: Self-pay | Admitting: Nurse Practitioner

## 2020-03-06 ENCOUNTER — Inpatient Hospital Stay (HOSPITAL_COMMUNITY): Payer: Medicare Other | Attending: Hematology

## 2020-03-06 ENCOUNTER — Inpatient Hospital Stay (HOSPITAL_COMMUNITY): Payer: Medicare Other | Attending: Nephrology | Admitting: Nurse Practitioner

## 2020-03-06 DIAGNOSIS — N189 Chronic kidney disease, unspecified: Secondary | ICD-10-CM | POA: Insufficient documentation

## 2020-03-06 DIAGNOSIS — J449 Chronic obstructive pulmonary disease, unspecified: Secondary | ICD-10-CM | POA: Diagnosis not present

## 2020-03-06 DIAGNOSIS — Z79899 Other long term (current) drug therapy: Secondary | ICD-10-CM | POA: Diagnosis not present

## 2020-03-06 DIAGNOSIS — Z833 Family history of diabetes mellitus: Secondary | ICD-10-CM | POA: Insufficient documentation

## 2020-03-06 DIAGNOSIS — Z8249 Family history of ischemic heart disease and other diseases of the circulatory system: Secondary | ICD-10-CM | POA: Diagnosis not present

## 2020-03-06 DIAGNOSIS — Z87891 Personal history of nicotine dependence: Secondary | ICD-10-CM | POA: Insufficient documentation

## 2020-03-06 DIAGNOSIS — D509 Iron deficiency anemia, unspecified: Secondary | ICD-10-CM | POA: Insufficient documentation

## 2020-03-06 DIAGNOSIS — E785 Hyperlipidemia, unspecified: Secondary | ICD-10-CM | POA: Insufficient documentation

## 2020-03-06 DIAGNOSIS — E538 Deficiency of other specified B group vitamins: Secondary | ICD-10-CM | POA: Diagnosis not present

## 2020-03-06 DIAGNOSIS — D631 Anemia in chronic kidney disease: Secondary | ICD-10-CM | POA: Diagnosis not present

## 2020-03-06 DIAGNOSIS — E119 Type 2 diabetes mellitus without complications: Secondary | ICD-10-CM | POA: Diagnosis not present

## 2020-03-06 DIAGNOSIS — Z7901 Long term (current) use of anticoagulants: Secondary | ICD-10-CM | POA: Insufficient documentation

## 2020-03-06 DIAGNOSIS — Z8349 Family history of other endocrine, nutritional and metabolic diseases: Secondary | ICD-10-CM | POA: Insufficient documentation

## 2020-03-06 DIAGNOSIS — I10 Essential (primary) hypertension: Secondary | ICD-10-CM | POA: Diagnosis not present

## 2020-03-06 LAB — COMPREHENSIVE METABOLIC PANEL
ALT: 8 U/L (ref 0–44)
AST: 9 U/L — ABNORMAL LOW (ref 15–41)
Albumin: 3.5 g/dL (ref 3.5–5.0)
Alkaline Phosphatase: 60 U/L (ref 38–126)
Anion gap: 10 (ref 5–15)
BUN: 17 mg/dL (ref 8–23)
CO2: 22 mmol/L (ref 22–32)
Calcium: 9.2 mg/dL (ref 8.9–10.3)
Chloride: 102 mmol/L (ref 98–111)
Creatinine, Ser: 1.19 mg/dL — ABNORMAL HIGH (ref 0.44–1.00)
GFR calc Af Amer: 52 mL/min — ABNORMAL LOW (ref >60)
GFR calc non Af Amer: 45 mL/min — ABNORMAL LOW (ref >60)
Glucose, Bld: 112 mg/dL — ABNORMAL HIGH (ref 70–99)
Potassium: 3.8 mmol/L (ref 3.5–5.1)
Sodium: 134 mmol/L — ABNORMAL LOW (ref 135–145)
Total Bilirubin: 0.7 mg/dL (ref 0.3–1.2)
Total Protein: 6.5 g/dL (ref 6.5–8.1)

## 2020-03-06 LAB — CBC WITH DIFFERENTIAL/PLATELET
Abs Immature Granulocytes: 0.02 10*3/uL (ref 0.00–0.07)
Basophils Absolute: 0 10*3/uL (ref 0.0–0.1)
Basophils Relative: 1 %
Eosinophils Absolute: 0.5 10*3/uL (ref 0.0–0.5)
Eosinophils Relative: 6 %
HCT: 35.7 % — ABNORMAL LOW (ref 36.0–46.0)
Hemoglobin: 11 g/dL — ABNORMAL LOW (ref 12.0–15.0)
Immature Granulocytes: 0 %
Lymphocytes Relative: 31 %
Lymphs Abs: 2.6 10*3/uL (ref 0.7–4.0)
MCH: 26.8 pg (ref 26.0–34.0)
MCHC: 30.8 g/dL (ref 30.0–36.0)
MCV: 87.1 fL (ref 80.0–100.0)
Monocytes Absolute: 0.5 10*3/uL (ref 0.1–1.0)
Monocytes Relative: 6 %
Neutro Abs: 4.7 10*3/uL (ref 1.7–7.7)
Neutrophils Relative %: 56 %
Platelets: 205 10*3/uL (ref 150–400)
RBC: 4.1 MIL/uL (ref 3.87–5.11)
RDW: 20.8 % — ABNORMAL HIGH (ref 11.5–15.5)
WBC: 8.4 10*3/uL (ref 4.0–10.5)
nRBC: 0 % (ref 0.0–0.2)

## 2020-03-06 LAB — VITAMIN D 25 HYDROXY (VIT D DEFICIENCY, FRACTURES): Vit D, 25-Hydroxy: 41.02 ng/mL (ref 30–100)

## 2020-03-06 LAB — IRON AND TIBC
Iron: 24 ug/dL — ABNORMAL LOW (ref 28–170)
Saturation Ratios: 15 % (ref 10.4–31.8)
TIBC: 158 ug/dL — ABNORMAL LOW (ref 250–450)
UIBC: 134 ug/dL

## 2020-03-06 LAB — VITAMIN B12: Vitamin B-12: 1294 pg/mL — ABNORMAL HIGH (ref 180–914)

## 2020-03-06 LAB — LACTATE DEHYDROGENASE: LDH: 103 U/L (ref 98–192)

## 2020-03-06 LAB — FERRITIN: Ferritin: 966 ng/mL — ABNORMAL HIGH (ref 11–307)

## 2020-03-06 NOTE — Assessment & Plan Note (Signed)
1.  Microcytic anemia: -Last colonoscopy was in June 2019 in [place], 3 polyps removed. -Etiology is from combination of CKD and relative iron deficiency. -Initial work-up included SPEP which was negative.  Stool was negative for occult blood.  Hemoglobin electrophoresis showed severe microcytosis related to her anemia.  She could have underlying thalassemia/sickle cell trait. -She has a history of needing a blood transfusion in the early 2000's. -She denies any bright red bleeding per rectum or melena. -Labs done on 03/06/2020 showed hemoglobin 11.0 -We have taken over Epogen injections for Dr. Theador Hawthorne.  We have started her on 10,000 units every 2 weeks. -She did not need her injection today. See her back 2 weeks for injection and talk about increasing her to every 3 weeks. -She will follow-up in 8 weeks with repeat labs.  2.  CKD: -Ultrasound the kidneys on 08/22/2019 showed bilateral renal atrophy, severe atrophy of the right kidney. -Labs done on 03/06/2020 showed creatinine 1.19 -She is getting Epogen injections every 2 weeks 10,000 units. -She follows up with nephrology next week.  3.  X42 and folic acid deficiency: -She is taking folic acid 1 mg daily -She is doing B12 injections monthly.

## 2020-03-06 NOTE — Progress Notes (Signed)
Pt's Hemoglobin was 11 today and per Randi Lockamy no Retacrit injection is needed.

## 2020-03-06 NOTE — Progress Notes (Signed)
[place], [place] [address]   CLINIC:  Medical Oncology/Hematology  PCP:  Doree Albee, MD [place] [address] [phone]   REASON FOR VISIT: Follow-up for microcytic anemia   CURRENT THERAPY: Epogen injections  INTERVAL HISTORY:  [name] 74 y.o. female returns for routine follow-up for microcytic anemia. Patient reports she is doing well since her last visit. Her energy levels have improved. She is still at the [facility] She is unhappy there due to not being able to get physical therapy. She denies any bright red bleeding per rectum or melena. She denies any easy bruising or bleeding. Denies any nausea, vomiting, or diarrhea. Denies any new pains. Had not noticed any recent bleeding such as epistaxis, hematuria or hematochezia. Denies recent chest pain on exertion, shortness of breath on minimal exertion, pre-syncopal episodes, or palpitations. Denies any numbness or tingling in hands or feet. Denies any recent fevers, infections, or recent hospitalizations. Patient reports appetite at 50% and energy level at 50%. She is eating well maintain her weight is time.    REVIEW OF SYSTEMS:  Review of Systems  Cardiovascular: Positive for leg swelling.  Gastrointestinal: Positive for constipation.  Neurological: Positive for numbness.  All other systems reviewed and are negative.    PAST MEDICAL/SURGICAL HISTORY:  Past Medical History:  Diagnosis Date  . Allergy   . Anemia    pernicious anemia  . Asthma   . Blood transfusion without reported diagnosis   . CKD (chronic kidney disease)   . COPD (chronic obstructive pulmonary disease) ([place]) 12/23/2019  . Gastritis   . Glaucoma   . History of blood in urine   . Hyperlipidemia   . Hypertension   . Ocular hypertension   . Osteoporosis   . Type 2 diabetes mellitus ([place]) 12/23/2019  . Vitamin B12 deficiency anemia due to intrinsic factor deficiency    Past Surgical  History:  Procedure Laterality Date  . CHOLECYSTECTOMY  2008  . COLONOSCOPY  04/30/2011   Procedure: COLONOSCOPY;  Surgeon: Rogene Houston, MD;  [facility];  Service: Endoscopy;  Laterality: N/A;  . COLONOSCOPY  05/25/2012   Procedure: COLONOSCOPY;  Surgeon: Rogene Houston, MD;  [facility];  Service: Endoscopy;  Laterality: N/A;  1:25-changed to 1200 Ann to notify pt  . COLONOSCOPY Bilateral 12/2017  . ESOPHAGOGASTRODUODENOSCOPY N/A 12/07/2019   Procedure: ESOPHAGOGASTRODUODENOSCOPY (EGD);  Surgeon: Rogene Houston, MD;  Location[facility];  Service: Endoscopy;  Laterality: N/A;  155  . GALLBLADDER SURGERY    . right breast cystectomy  1988  . TUBAL LIGATION  1975     SOCIAL HISTORY:  Social History   Socioeconomic History  . Marital status: Divorced    Spouse name: Not on file  . Number of children: 4  . Years of education: 9  . Highest education level: Not on file  Occupational History  . Not on file  Tobacco Use  . Smoking status: Former Smoker    Packs/day: 1.00    Years: 40.00    Pack years: 40.00    Quit date: 04/03/2011    Years since quitting: 8.9  . Smokeless tobacco: Never Used  Vaping Use  . Vaping Use: Never used  Substance and Sexual Activity  . Alcohol use: No  . Drug use: No  . Sexual activity: Not Currently  Other Topics Concern  . Not on file  Social History Narrative  [birth year] lives at [facility], [place]   Social Determinants of Health   Financial Resource Strain:   . Difficulty of Paying Living Expenses: Not on file  Food Insecurity:   . Worried About Charity fundraiser in the Last Year: Not on file  . Ran Out of Food in the Last Year: Not on file  Transportation Needs:   . Lack of Transportation (Medical): Not on file  . Lack of Transportation (Non-Medical): Not on file  Physical Activity:   . Days of Exercise per Week: Not on file  . Minutes of Exercise per Session: Not on file  Stress:   . Feeling  of Stress : Not on file  Social Connections:   . Frequency of Communication with Friends and Family: Not on file  . Frequency of Social Gatherings with Friends and Family: Not on file  . Attends Religious Services: Not on file  . Active Member of Clubs or Organizations: Not on file  . Attends Archivist Meetings: Not on file  . Marital Status: Not on file  Intimate Partner Violence:   . Fear of Current or Ex-Partner: Not on file  . Emotionally Abused: Not on file  . Physically Abused: Not on file  . Sexually Abused: Not on file    FAMILY HISTORY:  Family History  Problem Relation Age of Onset  . Diabetes Mother   . Hyperlipidemia Mother   . Hypertension Mother   . Heart disease Father   . Hyperlipidemia Father   . Diabetes Sister   . Hyperlipidemia Sister   . Hypertension Sister   . Cancer Brother   . Heart disease Brother   . Hyperlipidemia Brother   . Hypertension Brother   . Diabetes Sister     CURRENT MEDICATIONS:  Outpatient Encounter Medications as of 03/06/2020  Medication Sig  . acetaminophen (TYLENOL) 325 MG tablet Take 2 tablets (650 mg total) by mouth every 6 (six) hours as needed for mild pain (or Fever >/= 101).  Marland Kitchen albuterol (VENTOLIN HFA) 108 (90 Base) MCG/ACT inhaler Inhale 2 puffs into the lungs every 4 (four) hours as needed for wheezing or shortness of breath.  Marland Kitchen amLODipine (NORVASC) 10 MG tablet Take 1 tablet (10 mg total) by mouth daily.  Marland Kitchen apixaban (ELIQUIS) 5 MG TABS tablet Take 1 tablet (5 mg total) by mouth 2 (two) times daily.  Roseanne Kaufman Peru-Castor Oil (VENELEX) OINT Apply topically 3 (three) times daily. Special Instructions: Apply to sacrum and bilateral buttocks qshift for prevention.  . cyanocobalamin (,VITAMIN B-12,) 1000 MCG/ML injection Inject 1 mL (1,000 mcg total) into the muscle every 30 (thirty) days.  . dorzolamide (TRUSOPT) 2 % ophthalmic solution Place 1 drop into both eyes 2 (two) times daily.  . feeding supplement, ENSURE  ENLIVE, (ENSURE ENLIVE) LIQD Take 237 mLs by mouth 2 (two) times daily between meals.  . folic acid (FOLVITE) 1 MG tablet Take 1 mg by mouth daily.   Marland Kitchen gabapentin (NEURONTIN) 100 MG capsule Take 100 mg by mouth at bedtime.  Marland Kitchen latanoprost (XALATAN) 0.005 % ophthalmic solution Place 1 drop into both eyes at bedtime.  . metoCLOPramide (REGLAN) 5 MG tablet Take 1 tablet (5 mg total) by mouth 3 (three) times daily before meals.  . mirtazapine (REMERON) 15 MG tablet Take 0.5 tablets (7.5 mg total) by mouth at bedtime.  Marland Kitchen omeprazole (PRILOSEC) 40 MG capsule Take 40 mg by mouth daily.   . ondansetron (ZOFRAN ODT) 4 MG disintegrating tablet Take  1 tablet (4 mg total) by mouth every 8 (eight) hours as needed for nausea or vomiting. Dissolve under tongue  . NON FORMULARY Dys 2 (ground meats), add extra gravy to meats ALLERGIC TO FISH   No facility-administered encounter medications on file as of 03/06/2020.    ALLERGIES:  Allergies  Allergen Reactions  . Latex Rash  . Levofloxacin Nausea And Vomiting  . Neomycin-Bacitracin-Polymyxin  [Bacitracin-Neomycin-Polymyxin] Rash  . Other Rash and Swelling  . Prednisone Hives, Itching, Rash and Other (See Comments)  . Fish Allergy     rash  . Neosporin [Neomycin-Polymyxin-Gramicidin] Itching and Rash     PHYSICAL EXAM:  ECOG Performance status: 1  Vitals:   03/06/20 1010  BP: 122/69  Pulse: (!) 104  Resp: 17  Temp: (!) 97.1 F (36.2 C)  SpO2: 100%   Filed Weights   Physical Exam Constitutional:      Appearance: Normal appearance. She is normal weight.  Cardiovascular:     Rate and Rhythm: Normal rate and regular rhythm.     Heart sounds: Normal heart sounds.  Pulmonary:     Effort: Pulmonary effort is normal.     Breath sounds: Normal breath sounds.  Abdominal:     General: Bowel sounds are normal.     Palpations: Abdomen is soft.  Musculoskeletal:        General: Normal range of motion.  Skin:    General: Skin is warm.   Neurological:     Mental Status: She is alert and oriented to person, place, and time. Mental status is at baseline.  Psychiatric:        Mood and Affect: Mood normal.        Behavior: Behavior normal.        Thought Content: Thought content normal.        Judgment: Judgment normal.      LABORATORY DATA:  I have reviewed the labs as listed.  CBC    Component Value Date/Time   WBC 8.4 03/06/2020 0923   RBC 4.10 03/06/2020 0923   HGB 11.0 (L) 03/06/2020 0923   HCT 35.7 (L) 03/06/2020 0923   PLT 205 03/06/2020 0923   MCV 87.1 03/06/2020 0923   MCH 26.8 03/06/2020 0923   MCHC 30.8 03/06/2020 0923   RDW 20.8 (H) 03/06/2020 0923   LYMPHSABS 2.6 03/06/2020 0923   MONOABS 0.5 03/06/2020 0923   EOSABS 0.5 03/06/2020 0923   BASOSABS 0.0 03/06/2020 0923   CMP Latest Ref Rng & Units 03/06/2020 03/01/2020 02/23/2020  Glucose 70 - 99 mg/dL 112(H) 100(H) 99  BUN 8 - 23 mg/dL 17 16 18   Creatinine 0.44 - 1.00 mg/dL 1.19(H) 1.08(H) 1.15(H)  Sodium 135 - 145 mmol/L 134(L) 137 137  Potassium 3.5 - 5.1 mmol/L 3.8 3.5 3.4(L)  Chloride 98 - 111 mmol/L 102 106 106  CO2 22 - 32 mmol/L 22 23 22   Calcium 8.9 - 10.3 mg/dL 9.2 8.8(L) 8.8(L)  Total Protein 6.5 - 8.1 g/dL 6.5 - -  Total Bilirubin 0.3 - 1.2 mg/dL 0.7 - -  Alkaline Phos 38 - 126 U/L 60 - -  AST 15 - 41 U/L 9(L) - -  ALT 0 - 44 U/L 8 - -    All questions were answered to patient's stated satisfaction. Encouraged patient to call with any new concerns or questions before his next visit to the cancer center and we can certain see him sooner, if needed.     ASSESSMENT & PLAN:  Microcytic anemia  1.  Microcytic anemia: -Last colonoscopy was in June 2019 in [place], 3 polyps removed. -Etiology is from combination of CKD and relative iron deficiency. -Initial work-up included SPEP which was negative.  Stool was negative for occult blood.  Hemoglobin electrophoresis showed severe microcytosis related to her anemia.  She could have  underlying thalassemia/sickle cell trait. -She has a history of needing a blood transfusion in the early 2000's. -She denies any bright red bleeding per rectum or melena. -Labs done on 03/06/2020 showed hemoglobin 11.0 -We have taken over Epogen injections for Dr. Theador Hawthorne.  We have started her on 10,000 units every 2 weeks. -She did not need her injection today. See her back 2 weeks for injection and talk about increasing her to every 3 weeks. -She will follow-up in 8 weeks with repeat labs.  2.  CKD: -Ultrasound the kidneys on 08/22/2019 showed bilateral renal atrophy, severe atrophy of the right kidney. -Labs done on 03/06/2020 showed creatinine 1.19 -She is getting Epogen injections every 2 weeks 10,000 units. -She follows up with nephrology next week.  3.  B40 and folic acid deficiency: -She is taking folic acid 1 mg daily -She is doing B12 injections monthly.     Orders placed this encounter:  Orders Placed This Encounter  Procedures  . Lactate dehydrogenase  . CBC with Differential/Platelet  . Comprehensive metabolic panel  . Vitamin B12  . VITAMIN D 25 Hydroxy (Vit-D Deficiency, Fractures)      Francene Finders, FNP-C [facility] [phone]

## 2020-03-07 DIAGNOSIS — I129 Hypertensive chronic kidney disease with stage 1 through stage 4 chronic kidney disease, or unspecified chronic kidney disease: Secondary | ICD-10-CM | POA: Diagnosis not present

## 2020-03-07 DIAGNOSIS — D638 Anemia in other chronic diseases classified elsewhere: Secondary | ICD-10-CM | POA: Diagnosis not present

## 2020-03-07 DIAGNOSIS — E1122 Type 2 diabetes mellitus with diabetic chronic kidney disease: Secondary | ICD-10-CM | POA: Diagnosis not present

## 2020-03-07 DIAGNOSIS — E871 Hypo-osmolality and hyponatremia: Secondary | ICD-10-CM | POA: Diagnosis not present

## 2020-03-07 DIAGNOSIS — N189 Chronic kidney disease, unspecified: Secondary | ICD-10-CM | POA: Diagnosis not present

## 2020-03-08 ENCOUNTER — Encounter: Payer: Self-pay | Admitting: Adult Health

## 2020-03-11 DIAGNOSIS — Z1159 Encounter for screening for other viral diseases: Secondary | ICD-10-CM | POA: Diagnosis not present

## 2020-03-11 DIAGNOSIS — I82402 Acute embolism and thrombosis of unspecified deep veins of left lower extremity: Secondary | ICD-10-CM | POA: Diagnosis not present

## 2020-03-11 NOTE — Progress Notes (Signed)
Discussed with Hal Morales - she has an opening today that she could see the patient or one day next week.  Per receptionist the patient has already been given appointment to see Dr.Castaneda 03/14/2020. There have been cases of Covid in the [facility]. Per Lovey Newcomer at [facility] the patient has been tested and is negative, they retested all residents again today. The outbreak was on the [employer] , patient is on the [employer]. Ask that they let us know about results of the test today.  Will discuss with manager.

## 2020-03-12 NOTE — Progress Notes (Signed)
This encounter was created in error - please disregard.

## 2020-03-13 ENCOUNTER — Encounter: Payer: Self-pay | Admitting: Adult Health

## 2020-03-13 ENCOUNTER — Non-Acute Institutional Stay (SKILLED_NURSING_FACILITY): Payer: Medicare Other | Admitting: Adult Health

## 2020-03-13 DIAGNOSIS — N183 Chronic kidney disease, stage 3 unspecified: Secondary | ICD-10-CM

## 2020-03-13 DIAGNOSIS — E1121 Type 2 diabetes mellitus with diabetic nephropathy: Secondary | ICD-10-CM

## 2020-03-13 DIAGNOSIS — K2950 Unspecified chronic gastritis without bleeding: Secondary | ICD-10-CM

## 2020-03-13 DIAGNOSIS — E1122 Type 2 diabetes mellitus with diabetic chronic kidney disease: Secondary | ICD-10-CM | POA: Diagnosis not present

## 2020-03-13 DIAGNOSIS — N1832 Chronic kidney disease, stage 3b: Secondary | ICD-10-CM

## 2020-03-13 NOTE — Progress Notes (Signed)
Location:    [place] [facility] Room Number: 150/P Place of Service:  SNF (31)   CODE STATUS: DNR  Allergies  Allergen Reactions  . Latex Rash  . Levofloxacin Nausea And Vomiting  . Neomycin-Bacitracin-Polymyxin  [Bacitracin-Neomycin-Polymyxin] Rash  . Other Rash and Swelling  . Prednisone Hives, Itching, Rash and Other (See Comments)  . Fish Allergy     rash  . Neosporin [Neomycin-Polymyxin-Gramicidin] Itching and Rash    Chief Complaint  Patient presents with  . Medical Management of Chronic Issues           CKD stage 3 due to diabetes mellitus type 2:   Type 2 diabetes mellitus with stage 3 chronic kidney disease without long term current use  of insulin: . Other chronic gastritis without hemorrhage:    HPI:  She is a 74 year old long term resident of this facility  being seen for the management of her chronic illnesses:  CKD stage 3 due to diabetes mellitus type 2:   Type 2 diabetes mellitus with stage 3 chronic kidney disease without long term current use  of insulin: . Other chronic gastritis without hemorrhage. There are no reports of uncontrolled pain; no reports of agitation or anxiety. No reports of depressive thoughts.   Past Medical History:  Diagnosis Date  . Allergy   . Anemia    pernicious anemia  . Asthma   . Blood transfusion without reported diagnosis   . CKD (chronic kidney disease)   . COPD (chronic obstructive pulmonary disease) ([place]) 12/23/2019  . Gastritis   . Glaucoma   . History of blood in urine   . Hyperlipidemia   . Hypertension   . Ocular hypertension   . Osteoporosis   . Type 2 diabetes mellitus ([place]) 12/23/2019  . Vitamin B12 deficiency anemia due to intrinsic factor deficiency     Past Surgical History:  Procedure Laterality Date  . CHOLECYSTECTOMY  2008  . COLONOSCOPY  04/30/2011   Procedure: COLONOSCOPY;  Surgeon: Rogene Houston, MD;  [facility];  Service: Endoscopy;  Laterality: N/A;  . COLONOSCOPY   05/25/2012   Procedure: COLONOSCOPY;  Surgeon: Rogene Houston, MD;  [facility];  Service: Endoscopy;  Laterality: N/A;  1:25-changed to 1200 Ann to notify pt  . COLONOSCOPY Bilateral 12/2017  . ESOPHAGOGASTRODUODENOSCOPY N/A 12/07/2019   Procedure: ESOPHAGOGASTRODUODENOSCOPY (EGD);  Surgeon: Rogene Houston, MD;  [facility];  Service: Endoscopy;  Laterality: N/A;  155  . GALLBLADDER SURGERY    . right breast cystectomy  1988  . TUBAL LIGATION  1975    Social History   Socioeconomic History  . Marital status: Divorced    Spouse name: Not on file  . Number of children: 4  . Years of education: 9  . Highest education level: Not on file  Occupational History  . Not on file  Tobacco Use  . Smoking status: Former Smoker    Packs/day: 1.00    Years: 40.00    Pack years: 40.00    Quit date: 04/03/2011    Years since quitting: 8.9  . Smokeless tobacco: Never Used  Vaping Use  . Vaping Use: Never used  Substance and Sexual Activity  . Alcohol use: No  . Drug use: No  . Sexual activity: Not Currently  Other Topics Concern  . Not on file  Social History Narrative   01/17/20 lives at [facility], [place]   Social Determinants of Health  Financial Resource Strain:   . Difficulty of Paying Living Expenses: Not on file  Food Insecurity:   . Worried About Charity fundraiser in the Last Year: Not on file  . Ran Out of Food in the Last Year: Not on file  Transportation Needs:   . Lack of Transportation (Medical): Not on file  . Lack of Transportation (Non-Medical): Not on file  Physical Activity:   . Days of Exercise per Week: Not on file  . Minutes of Exercise per Session: Not on file  Stress:   . Feeling of Stress : Not on file  Social Connections:   . Frequency of Communication with Friends and Family: Not on file  . Frequency of Social Gatherings with Friends and Family: Not on file  . Attends Religious Services: Not on file  . Active Member of  Clubs or Organizations: Not on file  . Attends Archivist Meetings: Not on file  . Marital Status: Not on file  Intimate Partner Violence:   . Fear of Current or Ex-Partner: Not on file  . Emotionally Abused: Not on file  . Physically Abused: Not on file  . Sexually Abused: Not on file   Family History  Problem Relation Age of Onset  . Diabetes Mother   . Hyperlipidemia Mother   . Hypertension Mother   . Heart disease Father   . Hyperlipidemia Father   . Diabetes Sister   . Hyperlipidemia Sister   . Hypertension Sister   . Cancer Brother   . Heart disease Brother   . Hyperlipidemia Brother   . Hypertension Brother   . Diabetes Sister       VITAL SIGNS BP 108/62   Pulse 92   Temp 98.6 F (37 C) (Oral)   Resp 16   Ht 5\' 4"  (1.626 m)   Wt 113 lb 12.8 oz (51.6 kg)   BMI 19.53 kg/m   Outpatient Encounter Medications as of 03/13/2020  Medication Sig  . acetaminophen (TYLENOL) 325 MG tablet Take 2 tablets (650 mg total) by mouth every 6 (six) hours as needed for mild pain (or Fever >/= 101).  Marland Kitchen albuterol (VENTOLIN HFA) 108 (90 Base) MCG/ACT inhaler Inhale 2 puffs into the lungs every 4 (four) hours as needed for wheezing or shortness of breath.  Marland Kitchen amLODipine (NORVASC) 10 MG tablet Take 1 tablet (10 mg total) by mouth daily.  Marland Kitchen apixaban (ELIQUIS) 5 MG TABS tablet Take 1 tablet (5 mg total) by mouth 2 (two) times daily.  Roseanne Kaufman [place]-Castor Oil (VENELEX) OINT Apply topically 3 (three) times daily. Special Instructions: Apply to sacrum and bilateral buttocks qshift for prevention.  . cyanocobalamin (,VITAMIN B-12,) 1000 MCG/ML injection Inject 1 mL (1,000 mcg total) into the muscle every 30 (thirty) days.  . dorzolamide (TRUSOPT) 2 % ophthalmic solution Place 1 drop into both eyes 2 (two) times daily.  . feeding supplement, ENSURE ENLIVE, (ENSURE ENLIVE) LIQD Take 237 mLs by mouth 2 (two) times daily between meals.  . folic acid (FOLVITE) 1 MG tablet Take 1 mg by mouth  daily.   Marland Kitchen gabapentin (NEURONTIN) 100 MG capsule Take 100 mg by mouth at bedtime.  Marland Kitchen latanoprost (XALATAN) 0.005 % ophthalmic solution Place 1 drop into both eyes at bedtime.  . metoCLOPramide (REGLAN) 5 MG tablet Take 1 tablet (5 mg total) by mouth 3 (three) times daily before meals.  . NON FORMULARY Dys 2 (ground meats), add extra gravy to meats ALLERGIC TO FISH  .  omeprazole (PRILOSEC) 40 MG capsule Take 40 mg by mouth daily.   . ondansetron (ZOFRAN ODT) 4 MG disintegrating tablet Take 1 tablet (4 mg total) by mouth every 8 (eight) hours as needed for nausea or vomiting. Dissolve under tongue  . [DISCONTINUED] mirtazapine (REMERON) 15 MG tablet Take 0.5 tablets (7.5 mg total) by mouth at bedtime.   No facility-administered encounter medications on file as of 03/13/2020.     SIGNIFICANT DIAGNOSTIC EXAMS  PREVIOUS   12-23-19: chest x-ray: No active disease.   12-23-19: ct angio of chest:  1. No evidence of pulmonary embolism. 2. Paraseptal emphysema, mild and worse at the lung apices. 3. 3 mm nodule along the fissure in the RIGHT lower lobe. . 4. Moderate and with 3.6 cm caliber of ascending thoracic aorta.. Aortic aneurysm  5. Emphysema and aortic atherosclerosis  02-02-20: chest x-ray: COPD changes without acute infiltrate   02-02-20: bilateral lower extremity venous ultrasound:  Extensive acute appearing left femoropopliteal occlusive DVT extending into the calf veins. Negative for right lower extremity DVT  02-05-20: MRI cervical spine:  1. Widespread cervical spine degeneration. No acute osseous abnormality. 2. Multilevel mild to moderate spinal stenosis and spinal cord mass effect at C3-C4 through C6-C7, with possible spinal cord myelomalacia at the C6 level. No other cord signal abnormality. 3. Moderate or severe degenerative neural foraminal stenosis at the bilateral C5 and C6 nerve levels.   02-05-20: MRI thoracic spine:  Normal for age MRI appearance of the thoracic spine     02-06-20: ct of chest abdomen and pelvis:  1. No definite evidence of malignancy in the chest, abdomen, or pelvis. 2. Unchanged small bilateral lung nodules measuring up to 3 mm. Non-contrast chest CT can be considered in 12 months.  3. Large stool burden with rectal distension and presacral inflammation which may reflect stercoral proctitis. 4. Known left lower extremity DVT. 5. Nonobstructing nephrolithiasis. 6. Aortic Atherosclerosis and Emphysema .  NO NEW EXAMS.     LABS REVIEWED PREVIOUS   12-23-19: wbc 8.2; hgb 7.3; hct 25.2; mcv 58.3 plt 220; glucose 114; bun 15; creat 1.46; k+ 3.2; na++ 137 ca 9.4 liver normal albumin 4.1 d-dimer 0.86; mag 1.5; phos 3.3; urine culture: e-coli 12-25-19: wbc 8.2; hgb 7.1; hct 23.5; mcv 57.6 plt 215; glucose 111; bun 8; creat 1.08; k+ 4.2; na++ 135 ca 8.8; mag 1.6 12-26-19: wbc 7.9; hgb 9.5; hct 30.0 mcv 63.6 plt 205; glucose 110; bun 13; creat 1.21; k+ 4.1; na++ 136; ca 9.2 phos 3.4 albumin 3.6  01-01-20: wbc 10.8; hgb 9.9; hct 32.5; mcv 65.4 plt 282; glucose 99; bun 21; creat 1.41; k+ 3.9; na++ 133;ca 9.1 mag 1.9 free t4: 1.37 free t3: 2.1 tsh 7.150 01-08-20: ANA neg; CRP 15.6 01-09-20: wbc 8.2; hgb 8.2; hct 25.9; mcv 66.6 plt 238;  01-10-20: wbc 10.2; hgb 8.9; hct 28.2; mcv 67.6 plt 290; gucose 117; bun 25; creat 1.59; k+ 4.2; na++ 138; ca 9.2; live normal albumin 3.4 mag 1.9 RPR neg 02-01-20: wbc 10.6; hgb 28.1; mcv 74.1 plt 271; glucose 140 bun 27; creat 1.28; k+ 4.4; an++ 120; ca 8.9 liver normal albumin 3.4 mag 1.9 02-04-20: wbc 4.7; hgb 9.2; hct 28.7; mcv 78.2 plt 273 02-05-20: wbc 5.0; hgb 9.4; hct 29.2; mcv 78.3 plt 270; glucose 95; bun 19; creat 0.90; k+ 3.5; na++ 135; ca 8.6 liver normal albumin 2.6 02-08-20; glucose 85; bun 13; creat 0.91; k+ 3.6; na++ 134; ca 8.6 02-14-20: tsh 6.564 free T3: 2.2 free T4:  1.80 02-16-20: wbc 5.9; hgb 9.6; hct 30.3; mcv 81.9 plt 312; glucose 86; bun 15; creat 1.07; k+ 3.5; na++ 137; ca 8.4   NO NEW LABS.   Review of  Systems  Constitutional: Negative for malaise/fatigue.  Respiratory: Negative for cough and shortness of breath.   Cardiovascular: Negative for chest pain, palpitations and leg swelling.  Gastrointestinal: Negative for abdominal pain, constipation and heartburn.  Musculoskeletal: Negative for back pain, joint pain and myalgias.  Skin: Negative.   Neurological: Positive for weakness. Negative for dizziness.  Psychiatric/Behavioral: The patient is not nervous/anxious.      Physical Exam Constitutional:      General: She is not in acute distress.    Appearance: She is well-developed. She is not diaphoretic.  Neck:     Thyroid: No thyromegaly.  Cardiovascular:     Rate and Rhythm: Normal rate and regular rhythm.     Pulses: Normal pulses.     Heart sounds: Normal heart sounds.  Pulmonary:     Effort: Pulmonary effort is normal. No respiratory distress.     Breath sounds: Normal breath sounds.  Abdominal:     General: Bowel sounds are normal. There is no distension.     Palpations: Abdomen is soft.     Tenderness: There is no abdominal tenderness.  Musculoskeletal:     Cervical back: Neck supple.     Right lower leg: No edema.     Left lower leg: No edema.     Comments: Is able to move all extremities  Has poor fine motor abilities.    Lymphadenopathy:     Cervical: No cervical adenopathy.  Skin:    General: Skin is warm and dry.  Neurological:     Mental Status: She is alert and oriented to person, place, and time.  Psychiatric:        Mood and Affect: Mood normal.     ASSESSMENT/ PLAN:  TODAY  1. CKD stage 3 due to diabetes mellitus type 2: is stable bun 15; creat 1.07  2. Type 2 diabetes mellitus with stage 3 chronic kidney disease without long term current use  of insulin: will monitor   3. Other chronic gastritis without hemorrhage:is stable will continue prilosec 40 mg daily reglan 5 mg three times daily    PREVIOUS   4. Hypertension associated with stage 3  chronic kidney disease due to type 2 diabetes mellitus: is stable b/p 108/62 will continue norvasc 10 mg daily   5. Anemia due to chronic renal failure treated with erythropoietin stage 3: is stable hgb 9.4; will continue therapy per hematology   6. Increased intraocular pressure bilateral: is stable will continue trusopt to both eyes twice daily and xalatan to eye nightly   7. Vit B 12: deficiency: is stable will continue monthly b12 injections and folic acid 1 mg daily   8. Failure to thrive in adult is without change will continue remeron 7.5 mg nightly is off megace due to dvt.   9. Aortic atherosclerosis: is stable will monitor   10. Acute deep vein thrombosis (DVT) of proximal vein of left lower extremity: diagnosed 02-02-20 will continue eliquis 5 mg twice daily   11. Chronic obstructive pulmonary disease unspecified COPD type: is stable will continue albuterol 2 puffs every 4 hours as needed  12. Peripheral neuropathy: is stable will continue gabapentin 100 mg nightly     MD is aware of resident's narcotic use and is in agreement with current plan of care. We  will attempt to wean resident as appropriate.  Ok Edwards NP [facility]  Contact [phone] Monday through Friday 8am- 5pm  After hours call [phone]

## 2020-03-14 ENCOUNTER — Encounter (INDEPENDENT_AMBULATORY_CARE_PROVIDER_SITE_OTHER): Payer: Self-pay | Admitting: Gastroenterology

## 2020-03-14 ENCOUNTER — Ambulatory Visit (INDEPENDENT_AMBULATORY_CARE_PROVIDER_SITE_OTHER): Payer: Medicare Other | Admitting: Gastroenterology

## 2020-03-14 VITALS — BP 110/77 | HR 105 | Temp 97.9°F | Ht 64.0 in | Wt 113.0 lb

## 2020-03-14 DIAGNOSIS — D509 Iron deficiency anemia, unspecified: Secondary | ICD-10-CM

## 2020-03-14 DIAGNOSIS — R112 Nausea with vomiting, unspecified: Secondary | ICD-10-CM

## 2020-03-14 DIAGNOSIS — I82402 Acute embolism and thrombosis of unspecified deep veins of left lower extremity: Secondary | ICD-10-CM | POA: Diagnosis not present

## 2020-03-14 DIAGNOSIS — Z1159 Encounter for screening for other viral diseases: Secondary | ICD-10-CM | POA: Diagnosis not present

## 2020-03-14 NOTE — Progress Notes (Signed)
Traci Parker, M.D. Gastroenterology & Hepatology [facility] [address] [place], [place] [address]  Primary Care Physician: Traci Albee, MD [place] [address]  I will communicate my assessment and recommendations to the referring MD via EMR. "Note: Occasional unusual wording and randomly placed punctuation marks may result from the use of speech recognition technology to transcribe this document"  Problems: 1. Nausea and vomiting -resolved 2. Unintentional weight loss -resolved  History of Present Illness: Traci Parker is a 74 y.o. 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  The patient was last seen on 12/05/2019. At that time, the patient was evaluated for rapid weight loss, as well as episodes of nausea and vomiting for 3 months. The patient underwent EGD due to this, was found to have diffuse edema and friability in the entire stomach.  Biopsies were collected which were negative for H. pylori, consistent with chronic gastritis.  No other alterations were found.  Due to this, the patient was referred for EUS by Dr. Owens Loffler (this was also observed in CT scan, which was considered to be secondary to gastritis).  However, the patient had to be hospitalized due to hyponatremia a few days before her procedure so this test could not be done.  The patient states that since her most recent hospitalization on 02/01/2020 for hyponatremia, she has not had any complaint.  She reports that she has been having more appetite than prior and her weight has actually stabilized for the last month denies having any abdominal pain, nausea, vomiting, fever, chills since then.  She has been tolerating better her diet and  is taking Ensure twice a day.  However, given the fact that she does not have any dentures or complete mentation, she cannot have any solids to chew but is only eating soft food.  She states that she does not like the food at her nursing home.  More recently, she has been evaluated for possible neuropathy/ataxia, for which she has had numbness in her upper extremities and has been wheelchair-bound since June 2021.  She also follows with hematology for chronic microcytic anemia (has had very low MCV numbers in the high 50s), considered to be a combination of CKD mild iron deficiency anemia but also possible thalassemia/sickle cell trait.  Past Medical History: Past Medical History:  Diagnosis Date  . Allergy   . Anemia    pernicious anemia  . Asthma   . Blood transfusion without reported diagnosis   . CKD (chronic kidney disease)   . COPD (chronic obstructive pulmonary disease) ([place]) 12/23/2019  . Gastritis   . Glaucoma   . History of blood in urine   . Hyperlipidemia   . Hypertension   . Ocular hypertension   . Osteoporosis   . Type 2 diabetes mellitus ([place]) 12/23/2019  . Vitamin B12 deficiency anemia due to intrinsic factor deficiency     Past Surgical History: Past Surgical History:  Procedure Laterality Date  . CHOLECYSTECTOMY  2008  . COLONOSCOPY  04/30/2011   Procedure: COLONOSCOPY;  Surgeon: Traci Houston, MD;  [facility];  Service: Endoscopy;  Laterality: N/A;  . COLONOSCOPY  05/25/2012   Procedure: COLONOSCOPY;  Surgeon: Traci Houston, MD;  [facility];  Service: Endoscopy;  Laterality:  N/A;  1:25-changed to 1200 Ann to notify pt  . COLONOSCOPY Bilateral 12/2017  . ESOPHAGOGASTRODUODENOSCOPY N/A 12/07/2019   Procedure: ESOPHAGOGASTRODUODENOSCOPY (EGD);  Surgeon: Traci Houston, MD;  [facility];  Service: Endoscopy;  Laterality: N/A;  155  . GALLBLADDER SURGERY    . right breast cystectomy  1988  . TUBAL LIGATION  1975    Family  History: Family History  Problem Relation Age of Onset  . Diabetes Mother   . Hyperlipidemia Mother   . Hypertension Mother   . Heart disease Father   . Hyperlipidemia Father   . Diabetes Sister   . Hyperlipidemia Sister   . Hypertension Sister   . Cancer Brother   . Heart disease Brother   . Hyperlipidemia Brother   . Hypertension Brother   . Diabetes Sister     Social History: Social History   Tobacco Use  Smoking Status Former Smoker  . Packs/day: 1.00  . Years: 40.00  . Pack years: 40.00  . Quit date: 04/03/2011  . Years since quitting: 8.9  Smokeless Tobacco Never Used   Social History   Substance and Sexual Activity  Alcohol Use No   Social History   Substance and Sexual Activity  Drug Use No    Allergies: Allergies  Allergen Reactions  . Latex Rash  . Levofloxacin Nausea And Vomiting  . Neomycin-Bacitracin-Polymyxin  [Bacitracin-Neomycin-Polymyxin] Rash  . Other Rash and Swelling  . Prednisone Hives, Itching, Rash and Other (See Comments)  . Fish Allergy     rash  . Neosporin [Neomycin-Polymyxin-Gramicidin] Itching and Rash    Medications: No current outpatient medications on file.   No current facility-administered medications for this visit.    Review of Systems: GENERAL: negative for malaise, night sweats HEENT: No changes in hearing or vision, no nose bleeds or other nasal problems. NECK: Negative for lumps, goiter, pain and significant neck swelling RESPIRATORY: Negative for cough, wheezing CARDIOVASCULAR: Negative for chest pain, leg swelling, palpitations, orthopnea GI: SEE HPI MUSCULOSKELETAL: Negative for joint pain or swelling, back pain, and muscle pain. SKIN: Negative for lesions, rash PSYCH: Negative for sleep disturbance, mood disorder and recent psychosocial stressors. HEMATOLOGY Negative for prolonged bleeding, bruising easily, and swollen nodes. ENDOCRINE: Negative for cold or heat intolerance, polyuria, polydipsia and  goiter. NEURO: negative for tremor, gait imbalance, syncope and seizures. The remainder of the review of systems is noncontributory.   Physical Exam: BP 110/77 (BP Location: Right Arm, Patient Position: Sitting, Cuff Size: Normal)   Pulse (!) 105   Temp 97.9 F (36.6 C) (Oral)   Ht 5\' 4"  (1.626 m)   Wt 113 lb (51.3 kg)   BMI 19.40 kg/m  GENERAL: The patient is AO x3, in no acute distress.  Sitting in wheelchair.  Elder. HEENT: Head is normocephalic and atraumatic. EOMI are intact. Mouth is well hydrated and without lesions. NECK: Supple. No masses LUNGS: Clear to auscultation. No presence of rhonchi/wheezing/rales. Adequate chest expansion HEART: RRR, normal s1 and s2. ABDOMEN: Soft, nontender, no guarding, no peritoneal signs, and nondistended. BS +. No masses. EXTREMITIES: Without any cyanosis, clubbing, rash, lesions or edema. NEUROLOGIC: AOx3, no focal motor deficit. SKIN: no jaundice, no rashes  Imaging/Labs: as above  I personally reviewed and interpreted the available labs, imaging and endoscopic files.  Impression and Plan: Traci Parker is a 74 y.o. 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  The patient had significant weight loss in the past in the setting of recurrent episode of nausea and vomiting which have actually resolved.  She has been having some improvement after taking a PPI once a day.  Endoscopic investigations and abdominal imaging in the last 6 months have been unremarkable for any major alterations explain her symptoms.  Her appetite has improved and the patient has tolerated oral intake, which has been limited by her dentition.  I explained to her that it would be important to fix her dentition so she can advance her diet more and gain some more  weight, but this has been stable compared to her weight 4 months ago in her last visit (114 prior and now 113).  The patient and caregiver understood and agreed.  If she were to develop any recurrent nausea or vomiting she will need to call us back to reschedule EUS, which she would like to defer at this time as she does not feel like pursuing this route unless strictly necessary.  - Continue omeprazole 40 mg qday -Patient advised to call us back if nausea/vomiting recurs, may need to proceed with EUS -Follow-up with dentist  All questions were answered.      Harvel Quale, MD Gastroenterology and Hepatology [facility]

## 2020-03-14 NOTE — Patient Instructions (Signed)
Continue omeprazole 40 mg qday Please reach [place] back if nausea/vomiting recurs, may need to proceed with EUS

## 2020-03-19 DIAGNOSIS — M4712 Other spondylosis with myelopathy, cervical region: Secondary | ICD-10-CM | POA: Diagnosis not present

## 2020-03-20 ENCOUNTER — Inpatient Hospital Stay (HOSPITAL_COMMUNITY): Payer: Medicare Other | Attending: Hematology

## 2020-03-20 ENCOUNTER — Inpatient Hospital Stay (HOSPITAL_COMMUNITY): Payer: Medicare Other

## 2020-03-20 ENCOUNTER — Encounter (HOSPITAL_COMMUNITY): Payer: Self-pay

## 2020-03-20 VITALS — BP 125/65 | HR 101 | Temp 97.0°F | Resp 16

## 2020-03-20 DIAGNOSIS — D509 Iron deficiency anemia, unspecified: Secondary | ICD-10-CM

## 2020-03-20 DIAGNOSIS — E860 Dehydration: Secondary | ICD-10-CM

## 2020-03-20 DIAGNOSIS — N189 Chronic kidney disease, unspecified: Secondary | ICD-10-CM | POA: Diagnosis not present

## 2020-03-20 DIAGNOSIS — D631 Anemia in chronic kidney disease: Secondary | ICD-10-CM | POA: Insufficient documentation

## 2020-03-20 LAB — CBC WITH DIFFERENTIAL/PLATELET
Abs Immature Granulocytes: 0.02 10*3/uL (ref 0.00–0.07)
Basophils Absolute: 0 10*3/uL (ref 0.0–0.1)
Basophils Relative: 0 %
Eosinophils Absolute: 0.6 10*3/uL — ABNORMAL HIGH (ref 0.0–0.5)
Eosinophils Relative: 6 %
HCT: 34.3 % — ABNORMAL LOW (ref 36.0–46.0)
Hemoglobin: 10.5 g/dL — ABNORMAL LOW (ref 12.0–15.0)
Immature Granulocytes: 0 %
Lymphocytes Relative: 24 %
Lymphs Abs: 2.7 10*3/uL (ref 0.7–4.0)
MCH: 27.5 pg (ref 26.0–34.0)
MCHC: 30.6 g/dL (ref 30.0–36.0)
MCV: 89.8 fL (ref 80.0–100.0)
Monocytes Absolute: 0.7 10*3/uL (ref 0.1–1.0)
Monocytes Relative: 6 %
Neutro Abs: 6.9 10*3/uL (ref 1.7–7.7)
Neutrophils Relative %: 64 %
Platelets: 293 10*3/uL (ref 150–400)
RBC: 3.82 MIL/uL — ABNORMAL LOW (ref 3.87–5.11)
RDW: 18.7 % — ABNORMAL HIGH (ref 11.5–15.5)
WBC: 10.9 10*3/uL — ABNORMAL HIGH (ref 4.0–10.5)
nRBC: 0 % (ref 0.0–0.2)

## 2020-03-20 LAB — COMPREHENSIVE METABOLIC PANEL
ALT: 6 U/L (ref 0–44)
AST: 9 U/L — ABNORMAL LOW (ref 15–41)
Albumin: 3.4 g/dL — ABNORMAL LOW (ref 3.5–5.0)
Alkaline Phosphatase: 61 U/L (ref 38–126)
Anion gap: 10 (ref 5–15)
BUN: 18 mg/dL (ref 8–23)
CO2: 27 mmol/L (ref 22–32)
Calcium: 9.5 mg/dL (ref 8.9–10.3)
Chloride: 102 mmol/L (ref 98–111)
Creatinine, Ser: 1.11 mg/dL — ABNORMAL HIGH (ref 0.44–1.00)
GFR calc Af Amer: 56 mL/min — ABNORMAL LOW (ref >60)
GFR calc non Af Amer: 49 mL/min — ABNORMAL LOW (ref >60)
Glucose, Bld: 114 mg/dL — ABNORMAL HIGH (ref 70–99)
Potassium: 4.4 mmol/L (ref 3.5–5.1)
Sodium: 139 mmol/L (ref 135–145)
Total Bilirubin: 0.8 mg/dL (ref 0.3–1.2)
Total Protein: 6.6 g/dL (ref 6.5–8.1)

## 2020-03-20 LAB — LACTATE DEHYDROGENASE: LDH: 94 U/L — ABNORMAL LOW (ref 98–192)

## 2020-03-20 LAB — VITAMIN B12: Vitamin B-12: 1376 pg/mL — ABNORMAL HIGH (ref 180–914)

## 2020-03-20 LAB — VITAMIN D 25 HYDROXY (VIT D DEFICIENCY, FRACTURES): Vit D, 25-Hydroxy: 45.43 ng/mL (ref 30–100)

## 2020-03-20 MED ORDER — EPOETIN ALFA-EPBX 10000 UNIT/ML IJ SOLN
10000.0000 [IU] | Freq: Once | INTRAMUSCULAR | Status: AC
  Administered 2020-03-20: 10000 [IU] via SUBCUTANEOUS
  Filled 2020-03-20: qty 1

## 2020-03-20 NOTE — Progress Notes (Signed)
Patient tolerated Retacrit injection with no complaints voiced.  Site clean and dry with no bruising or swelling noted at site.  Band aid applied.  VSS with discharge and left in a wheelchair with no s/s of distress noted. 

## 2020-03-21 DIAGNOSIS — I82402 Acute embolism and thrombosis of unspecified deep veins of left lower extremity: Secondary | ICD-10-CM | POA: Diagnosis not present

## 2020-03-21 DIAGNOSIS — Z1159 Encounter for screening for other viral diseases: Secondary | ICD-10-CM | POA: Diagnosis not present

## 2020-03-22 DIAGNOSIS — I82402 Acute embolism and thrombosis of unspecified deep veins of left lower extremity: Secondary | ICD-10-CM | POA: Diagnosis not present

## 2020-03-22 DIAGNOSIS — R270 Ataxia, unspecified: Secondary | ICD-10-CM | POA: Diagnosis not present

## 2020-03-22 DIAGNOSIS — R279 Unspecified lack of coordination: Secondary | ICD-10-CM | POA: Diagnosis not present

## 2020-03-22 DIAGNOSIS — M6281 Muscle weakness (generalized): Secondary | ICD-10-CM | POA: Diagnosis not present

## 2020-03-22 DIAGNOSIS — M4802 Spinal stenosis, cervical region: Secondary | ICD-10-CM | POA: Diagnosis not present

## 2020-03-25 DIAGNOSIS — M6281 Muscle weakness (generalized): Secondary | ICD-10-CM | POA: Diagnosis not present

## 2020-03-25 DIAGNOSIS — I82402 Acute embolism and thrombosis of unspecified deep veins of left lower extremity: Secondary | ICD-10-CM | POA: Diagnosis not present

## 2020-03-25 DIAGNOSIS — R270 Ataxia, unspecified: Secondary | ICD-10-CM | POA: Diagnosis not present

## 2020-03-25 DIAGNOSIS — R279 Unspecified lack of coordination: Secondary | ICD-10-CM | POA: Diagnosis not present

## 2020-03-25 DIAGNOSIS — Z1159 Encounter for screening for other viral diseases: Secondary | ICD-10-CM | POA: Diagnosis not present

## 2020-03-25 DIAGNOSIS — M4802 Spinal stenosis, cervical region: Secondary | ICD-10-CM | POA: Diagnosis not present

## 2020-03-26 DIAGNOSIS — M4802 Spinal stenosis, cervical region: Secondary | ICD-10-CM | POA: Diagnosis not present

## 2020-03-26 DIAGNOSIS — R270 Ataxia, unspecified: Secondary | ICD-10-CM | POA: Diagnosis not present

## 2020-03-26 DIAGNOSIS — R279 Unspecified lack of coordination: Secondary | ICD-10-CM | POA: Diagnosis not present

## 2020-03-26 DIAGNOSIS — M6281 Muscle weakness (generalized): Secondary | ICD-10-CM | POA: Diagnosis not present

## 2020-03-26 DIAGNOSIS — I82402 Acute embolism and thrombosis of unspecified deep veins of left lower extremity: Secondary | ICD-10-CM | POA: Diagnosis not present

## 2020-03-27 DIAGNOSIS — M6281 Muscle weakness (generalized): Secondary | ICD-10-CM | POA: Diagnosis not present

## 2020-03-27 DIAGNOSIS — R270 Ataxia, unspecified: Secondary | ICD-10-CM | POA: Diagnosis not present

## 2020-03-27 DIAGNOSIS — I82402 Acute embolism and thrombosis of unspecified deep veins of left lower extremity: Secondary | ICD-10-CM | POA: Diagnosis not present

## 2020-03-27 DIAGNOSIS — R279 Unspecified lack of coordination: Secondary | ICD-10-CM | POA: Diagnosis not present

## 2020-03-27 DIAGNOSIS — M4802 Spinal stenosis, cervical region: Secondary | ICD-10-CM | POA: Diagnosis not present

## 2020-03-28 DIAGNOSIS — R270 Ataxia, unspecified: Secondary | ICD-10-CM | POA: Diagnosis not present

## 2020-03-28 DIAGNOSIS — M6281 Muscle weakness (generalized): Secondary | ICD-10-CM | POA: Diagnosis not present

## 2020-03-28 DIAGNOSIS — I82402 Acute embolism and thrombosis of unspecified deep veins of left lower extremity: Secondary | ICD-10-CM | POA: Diagnosis not present

## 2020-03-28 DIAGNOSIS — R279 Unspecified lack of coordination: Secondary | ICD-10-CM | POA: Diagnosis not present

## 2020-03-28 DIAGNOSIS — Z1159 Encounter for screening for other viral diseases: Secondary | ICD-10-CM | POA: Diagnosis not present

## 2020-03-28 DIAGNOSIS — M4802 Spinal stenosis, cervical region: Secondary | ICD-10-CM | POA: Diagnosis not present

## 2020-03-29 DIAGNOSIS — R270 Ataxia, unspecified: Secondary | ICD-10-CM | POA: Diagnosis not present

## 2020-03-29 DIAGNOSIS — M6281 Muscle weakness (generalized): Secondary | ICD-10-CM | POA: Diagnosis not present

## 2020-03-29 DIAGNOSIS — I82402 Acute embolism and thrombosis of unspecified deep veins of left lower extremity: Secondary | ICD-10-CM | POA: Diagnosis not present

## 2020-03-29 DIAGNOSIS — R279 Unspecified lack of coordination: Secondary | ICD-10-CM | POA: Diagnosis not present

## 2020-03-29 DIAGNOSIS — M4802 Spinal stenosis, cervical region: Secondary | ICD-10-CM | POA: Diagnosis not present

## 2020-04-01 DIAGNOSIS — M4802 Spinal stenosis, cervical region: Secondary | ICD-10-CM | POA: Diagnosis not present

## 2020-04-01 DIAGNOSIS — M6281 Muscle weakness (generalized): Secondary | ICD-10-CM | POA: Diagnosis not present

## 2020-04-01 DIAGNOSIS — Z1159 Encounter for screening for other viral diseases: Secondary | ICD-10-CM | POA: Diagnosis not present

## 2020-04-01 DIAGNOSIS — I82402 Acute embolism and thrombosis of unspecified deep veins of left lower extremity: Secondary | ICD-10-CM | POA: Diagnosis not present

## 2020-04-01 DIAGNOSIS — R279 Unspecified lack of coordination: Secondary | ICD-10-CM | POA: Diagnosis not present

## 2020-04-01 DIAGNOSIS — R270 Ataxia, unspecified: Secondary | ICD-10-CM | POA: Diagnosis not present

## 2020-04-02 DIAGNOSIS — M6281 Muscle weakness (generalized): Secondary | ICD-10-CM | POA: Diagnosis not present

## 2020-04-02 DIAGNOSIS — R279 Unspecified lack of coordination: Secondary | ICD-10-CM | POA: Diagnosis not present

## 2020-04-02 DIAGNOSIS — R270 Ataxia, unspecified: Secondary | ICD-10-CM | POA: Diagnosis not present

## 2020-04-02 DIAGNOSIS — M4802 Spinal stenosis, cervical region: Secondary | ICD-10-CM | POA: Diagnosis not present

## 2020-04-02 DIAGNOSIS — I82402 Acute embolism and thrombosis of unspecified deep veins of left lower extremity: Secondary | ICD-10-CM | POA: Diagnosis not present

## 2020-04-03 ENCOUNTER — Inpatient Hospital Stay (HOSPITAL_COMMUNITY): Payer: Medicare Other

## 2020-04-03 ENCOUNTER — Other Ambulatory Visit: Payer: Self-pay

## 2020-04-03 VITALS — BP 110/70 | HR 104 | Temp 97.6°F | Resp 18

## 2020-04-03 DIAGNOSIS — E860 Dehydration: Secondary | ICD-10-CM

## 2020-04-03 DIAGNOSIS — M4802 Spinal stenosis, cervical region: Secondary | ICD-10-CM | POA: Diagnosis not present

## 2020-04-03 DIAGNOSIS — M6281 Muscle weakness (generalized): Secondary | ICD-10-CM | POA: Diagnosis not present

## 2020-04-03 DIAGNOSIS — I82402 Acute embolism and thrombosis of unspecified deep veins of left lower extremity: Secondary | ICD-10-CM | POA: Diagnosis not present

## 2020-04-03 DIAGNOSIS — R279 Unspecified lack of coordination: Secondary | ICD-10-CM | POA: Diagnosis not present

## 2020-04-03 DIAGNOSIS — N189 Chronic kidney disease, unspecified: Secondary | ICD-10-CM | POA: Diagnosis not present

## 2020-04-03 DIAGNOSIS — D631 Anemia in chronic kidney disease: Secondary | ICD-10-CM | POA: Diagnosis not present

## 2020-04-03 DIAGNOSIS — D509 Iron deficiency anemia, unspecified: Secondary | ICD-10-CM

## 2020-04-03 DIAGNOSIS — R270 Ataxia, unspecified: Secondary | ICD-10-CM | POA: Diagnosis not present

## 2020-04-03 LAB — CBC WITH DIFFERENTIAL/PLATELET
Abs Immature Granulocytes: 0.02 10*3/uL (ref 0.00–0.07)
Basophils Absolute: 0.1 10*3/uL (ref 0.0–0.1)
Basophils Relative: 1 %
Eosinophils Absolute: 0.4 10*3/uL (ref 0.0–0.5)
Eosinophils Relative: 4 %
HCT: 34.3 % — ABNORMAL LOW (ref 36.0–46.0)
Hemoglobin: 10.5 g/dL — ABNORMAL LOW (ref 12.0–15.0)
Immature Granulocytes: 0 %
Lymphocytes Relative: 34 %
Lymphs Abs: 3 10*3/uL (ref 0.7–4.0)
MCH: 28.1 pg (ref 26.0–34.0)
MCHC: 30.6 g/dL (ref 30.0–36.0)
MCV: 91.7 fL (ref 80.0–100.0)
Monocytes Absolute: 0.6 10*3/uL (ref 0.1–1.0)
Monocytes Relative: 6 %
Neutro Abs: 4.7 10*3/uL (ref 1.7–7.7)
Neutrophils Relative %: 55 %
Platelets: 324 10*3/uL (ref 150–400)
RBC: 3.74 MIL/uL — ABNORMAL LOW (ref 3.87–5.11)
RDW: 17.2 % — ABNORMAL HIGH (ref 11.5–15.5)
WBC: 8.7 10*3/uL (ref 4.0–10.5)
nRBC: 0 % (ref 0.0–0.2)

## 2020-04-03 MED ORDER — EPOETIN ALFA-EPBX 10000 UNIT/ML IJ SOLN
INTRAMUSCULAR | Status: AC
  Filled 2020-04-03: qty 1

## 2020-04-03 MED ORDER — EPOETIN ALFA-EPBX 10000 UNIT/ML IJ SOLN
10000.0000 [IU] | Freq: Once | INTRAMUSCULAR | Status: AC
  Administered 2020-04-03: 10000 [IU] via SUBCUTANEOUS

## 2020-04-03 NOTE — Progress Notes (Signed)
Traci Parker presents today for injection per the provider's orders.  Retacrit administration without incident; injection site WNL; see MAR for injection details. Patient tolerated procedure well and without incident.  No questions or complaints noted at this time. Discharged in stable condition via wheelchair in [place] staff member.

## 2020-04-04 ENCOUNTER — Non-Acute Institutional Stay (SKILLED_NURSING_FACILITY): Payer: Medicare Other | Admitting: Adult Health

## 2020-04-04 ENCOUNTER — Encounter: Payer: Self-pay | Admitting: Adult Health

## 2020-04-04 DIAGNOSIS — L89610 Pressure ulcer of right heel, unstageable: Secondary | ICD-10-CM

## 2020-04-04 DIAGNOSIS — I82402 Acute embolism and thrombosis of unspecified deep veins of left lower extremity: Secondary | ICD-10-CM | POA: Diagnosis not present

## 2020-04-04 DIAGNOSIS — M4802 Spinal stenosis, cervical region: Secondary | ICD-10-CM | POA: Diagnosis not present

## 2020-04-04 DIAGNOSIS — R270 Ataxia, unspecified: Secondary | ICD-10-CM | POA: Diagnosis not present

## 2020-04-04 DIAGNOSIS — M6281 Muscle weakness (generalized): Secondary | ICD-10-CM | POA: Diagnosis not present

## 2020-04-04 DIAGNOSIS — I700 Atherosclerosis of aorta: Secondary | ICD-10-CM | POA: Diagnosis not present

## 2020-04-04 DIAGNOSIS — R627 Adult failure to thrive: Secondary | ICD-10-CM | POA: Diagnosis not present

## 2020-04-04 DIAGNOSIS — R279 Unspecified lack of coordination: Secondary | ICD-10-CM | POA: Diagnosis not present

## 2020-04-04 NOTE — Progress Notes (Signed)
Location:    [facility] Room Number: 150/P Place of Service:  SNF (31)   CODE STATUS: DNR  Allergies  Allergen Reactions  . Latex Rash  . Levofloxacin Nausea And Vomiting  . Neomycin-Bacitracin-Polymyxin  [Bacitracin-Neomycin-Polymyxin] Rash  . Other Rash and Swelling  . Prednisone Hives, Itching, Rash and Other (See Comments)  . Fish Allergy     rash  . Neosporin [Neomycin-Polymyxin-Gramicidin] Itching and Rash    Chief Complaint  Patient presents with  . Acute Visit    Care Plan Meeting    HPI:  We have come together for her care plan meeting. Family present  BIMS 15/15 mood 6/30. She continue to have a poor appetite. Her weight is stable at 109-113 pounds. She has had one fall with no injury. She is extensive to dependent with her adls. Does feed self with assistance. Is nonambulatory; is incontinent of bladder and bowel. She is working with therapy on standing and pivoting. There are no reports of uncontrolled pain; no reports of anxiety no reports of insomnia present. She continues to be followed for her chronic illnesses including:  unstagable pressure ulcer right heel  Aortic atherosclerosis  Failure to thrive in adult   Past Medical History:  Diagnosis Date  . Allergy   . Anemia    pernicious anemia  . Asthma   . Blood transfusion without reported diagnosis   . CKD (chronic kidney disease)   . COPD (chronic obstructive pulmonary disease) ([place]) 12/23/2019  . Gastritis   . Glaucoma   . History of blood in urine   . Hyperlipidemia   . Hypertension   . Ocular hypertension   . Osteoporosis   . Type 2 diabetes mellitus ([place]) 12/23/2019  . Vitamin B12 deficiency anemia due to intrinsic factor deficiency     Past Surgical History:  Procedure Laterality Date  . CHOLECYSTECTOMY  2008  . COLONOSCOPY  04/30/2011   Procedure: COLONOSCOPY;  Surgeon: Rogene Houston, MD;  [facility];  Service: Endoscopy;  Laterality: N/A;  .  COLONOSCOPY  05/25/2012   Procedure: COLONOSCOPY;  Surgeon: Rogene Houston, MD;  Location: [facility];  Service: Endoscopy;  Laterality: N/A;  1:25-changed to 1200 Ann to notify pt  . COLONOSCOPY Bilateral 12/2017  . ESOPHAGOGASTRODUODENOSCOPY N/A 12/07/2019   Procedure: ESOPHAGOGASTRODUODENOSCOPY (EGD);  Surgeon: Rogene Houston, MD;  Location: [facility];  Service: Endoscopy;  Laterality: N/A;  155  . GALLBLADDER SURGERY    . right breast cystectomy  1988  . TUBAL LIGATION  1975    Social History   Socioeconomic History  . Marital status: Divorced    Spouse name: Not on file  . Number of children: 4  . Years of education: 9  . Highest education level: Not on file  Occupational History  . Not on file  Tobacco Use  . Smoking status: Former Smoker    Packs/day: 1.00    Years: 40.00    Pack years: 40.00    Quit date: 04/03/2011    Years since quitting: 9.0  . Smokeless tobacco: Never Used  Vaping Use  . Vaping Use: Never used  Substance and Sexual Activity  . Alcohol use: No  . Drug use: No  . Sexual activity: Not Currently  Other Topics Concern  . Not on file  Social History Narrative   [birth year] lives at [facility], [place]   Social Determinants of Health   Financial Resource Strain:   . Difficulty  of Paying Living Expenses: Not on file  Food Insecurity:   . Worried About Charity fundraiser in the Last Year: Not on file  . Ran Out of Food in the Last Year: Not on file  Transportation Needs:   . Lack of Transportation (Medical): Not on file  . Lack of Transportation (Non-Medical): Not on file  Physical Activity:   . Days of Exercise per Week: Not on file  . Minutes of Exercise per Session: Not on file  Stress:   . Feeling of Stress : Not on file  Social Connections:   . Frequency of Communication with Friends and Family: Not on file  . Frequency of Social Gatherings with Friends and Family: Not on file  . Attends Religious Services: Not on file  .  Active Member of Clubs or Organizations: Not on file  . Attends Archivist Meetings: Not on file  . Marital Status: Not on file  Intimate Partner Violence:   . Fear of Current or Ex-Partner: Not on file  . Emotionally Abused: Not on file  . Physically Abused: Not on file  . Sexually Abused: Not on file   Family History  Problem Relation Age of Onset  . Diabetes Mother   . Hyperlipidemia Mother   . Hypertension Mother   . Heart disease Father   . Hyperlipidemia Father   . Diabetes Sister   . Hyperlipidemia Sister   . Hypertension Sister   . Cancer Brother   . Heart disease Brother   . Hyperlipidemia Brother   . Hypertension Brother   . Diabetes Sister       VITAL SIGNS BP 98/60   Pulse 96   Temp 98 F (36.7 C)   Resp 16   Ht 5\' 4"  (1.626 m)   Wt 113 lb 12.8 oz (51.6 kg)   BMI 19.53 kg/m   Outpatient Encounter Medications as of 04/04/2020  Medication Sig  . acetaminophen (TYLENOL) 325 MG tablet Take 2 tablets (650 mg total) by mouth every 6 (six) hours as needed for mild pain (or Fever >/= 101).  Marland Kitchen albuterol (VENTOLIN HFA) 108 (90 Base) MCG/ACT inhaler Inhale 2 puffs into the lungs every 4 (four) hours as needed for wheezing or shortness of breath.  Marland Kitchen amLODipine (NORVASC) 10 MG tablet Take 1 tablet (10 mg total) by mouth daily.  Marland Kitchen apixaban (ELIQUIS) 5 MG TABS tablet Take 1 tablet (5 mg total) by mouth 2 (two) times daily.  Roseanne Kaufman [place]-Castor Oil (VENELEX) OINT Apply topically 3 (three) times daily. Special Instructions: Apply to sacrum and bilateral buttocks qshift for prevention.  . cyanocobalamin (,VITAMIN B-12,) 1000 MCG/ML injection Inject 1 mL (1,000 mcg total) into the muscle every 30 (thirty) days.  . dorzolamide (TRUSOPT) 2 % ophthalmic solution Place 1 drop into both eyes 2 (two) times daily.  . feeding supplement, ENSURE ENLIVE, (ENSURE ENLIVE) LIQD Take 237 mLs by mouth 2 (two) times daily between meals.  . folic acid (FOLVITE) 1 MG tablet Take 1  mg by mouth daily.   Marland Kitchen gabapentin (NEURONTIN) 100 MG capsule Take 100 mg by mouth at bedtime.  Marland Kitchen latanoprost (XALATAN) 0.005 % ophthalmic solution Place 1 drop into both eyes at bedtime.  . metoCLOPramide (REGLAN) 5 MG tablet Take 1 tablet (5 mg total) by mouth 3 (three) times daily before meals.  . NON FORMULARY Dys 2 (ground meats), add extra gravy to meats ALLERGIC TO FISH  . omeprazole (PRILOSEC) 40 MG capsule Take 40 mg  by mouth daily.   . ondansetron (ZOFRAN ODT) 4 MG disintegrating tablet Take 1 tablet (4 mg total) by mouth every 8 (eight) hours as needed for nausea or vomiting. Dissolve under tongue  . [DISCONTINUED] ALPRAZolam (XANAX) 0.25 MG tablet Take by mouth.  . [DISCONTINUED] megestrol (MEGACE) 40 MG/ML suspension Take by mouth.  . [DISCONTINUED] mirtazapine (REMERON) 7.5 MG tablet Take by mouth.   No facility-administered encounter medications on file as of 04/04/2020.     SIGNIFICANT DIAGNOSTIC EXAMS   PREVIOUS   12-23-19: chest x-ray: No active disease.   12-23-19: ct angio of chest:  1. No evidence of pulmonary embolism. 2. Paraseptal emphysema, mild and worse at the lung apices. 3. 3 mm nodule along the fissure in the RIGHT lower lobe. . 4. Moderate and with 3.6 cm caliber of ascending thoracic aorta.. Aortic aneurysm  5. Emphysema and aortic atherosclerosis  02-02-20: chest x-ray: COPD changes without acute infiltrate   02-02-20: bilateral lower extremity venous ultrasound:  Extensive acute appearing left femoropopliteal occlusive DVT extending into the calf veins. Negative for right lower extremity DVT  02-05-20: MRI cervical spine:  1. Widespread cervical spine degeneration. No acute osseous abnormality. 2. Multilevel mild to moderate spinal stenosis and spinal cord mass effect at C3-C4 through C6-C7, with possible spinal cord myelomalacia at the C6 level. No other cord signal abnormality. 3. Moderate or severe degenerative neural foraminal stenosis at the  bilateral C5 and C6 nerve levels.   02-05-20: MRI thoracic spine:  Normal for age MRI appearance of the thoracic spine   02-06-20: ct of chest abdomen and pelvis:  1. No definite evidence of malignancy in the chest, abdomen, or pelvis. 2. Unchanged small bilateral lung nodules measuring up to 3 mm. Non-contrast chest CT can be considered in 12 months.  3. Large stool burden with rectal distension and presacral inflammation which may reflect stercoral proctitis. 4. Known left lower extremity DVT. 5. Nonobstructing nephrolithiasis. 6. Aortic Atherosclerosis and Emphysema .  NO NEW EXAMS.     LABS REVIEWED PREVIOUS   12-23-19: wbc 8.2; hgb 7.3; hct 25.2; mcv 58.3 plt 220; glucose 114; bun 15; creat 1.46; k+ 3.2; na++ 137 ca 9.4 liver normal albumin 4.1 d-dimer 0.86; mag 1.5; phos 3.3; urine culture: e-coli 12-25-19: wbc 8.2; hgb 7.1; hct 23.5; mcv 57.6 plt 215; glucose 111; bun 8; creat 1.08; k+ 4.2; na++ 135 ca 8.8; mag 1.6 12-26-19: wbc 7.9; hgb 9.5; hct 30.0 mcv 63.6 plt 205; glucose 110; bun 13; creat 1.21; k+ 4.1; na++ 136; ca 9.2 phos 3.4 albumin 3.6  01-01-20: wbc 10.8; hgb 9.9; hct 32.5; mcv 65.4 plt 282; glucose 99; bun 21; creat 1.41; k+ 3.9; na++ 133;ca 9.1 mag 1.9 free t4: 1.37 free t3: 2.1 tsh 7.150 01-08-20: ANA neg; CRP 15.6 01-09-20: wbc 8.2; hgb 8.2; hct 25.9; mcv 66.6 plt 238;  01-10-20: wbc 10.2; hgb 8.9; hct 28.2; mcv 67.6 plt 290; gucose 117; bun 25; creat 1.59; k+ 4.2; na++ 138; ca 9.2; live normal albumin 3.4 mag 1.9 RPR neg 02-01-20: wbc 10.6; hgb 28.1; mcv 74.1 plt 271; glucose 140 bun 27; creat 1.28; k+ 4.4; an++ 120; ca 8.9 liver normal albumin 3.4 mag 1.9 02-04-20: wbc 4.7; hgb 9.2; hct 28.7; mcv 78.2 plt 273 02-05-20: wbc 5.0; hgb 9.4; hct 29.2; mcv 78.3 plt 270; glucose 95; bun 19; creat 0.90; k+ 3.5; na++ 135; ca 8.6 liver normal albumin 2.6 02-08-20; glucose 85; bun 13; creat 0.91; k+ 3.6; na++ 134; ca 8.6 02-14-20:  tsh 6.564 free T3: 2.2 free T4: 1.80 02-16-20: wbc 5.9; hgb  9.6; hct 30.3; mcv 81.9 plt 312; glucose 86; bun 15; creat 1.07; k+ 3.5; na++ 137; ca 8.4   NO NEW LABS.    Review of Systems  Constitutional: Negative for malaise/fatigue.  Respiratory: Negative for cough and shortness of breath.   Cardiovascular: Negative for chest pain, palpitations and leg swelling.  Gastrointestinal: Negative for abdominal pain, constipation and heartburn.  Musculoskeletal: Negative for back pain, joint pain and myalgias.  Skin: Negative.   Neurological: Positive for weakness. Negative for dizziness.  Psychiatric/Behavioral: The patient is not nervous/anxious.     Physical Exam Constitutional:      General: She is not in acute distress.    Appearance: She is well-developed. She is not diaphoretic.  Neck:     Thyroid: No thyromegaly.  Cardiovascular:     Rate and Rhythm: Normal rate and regular rhythm.     Pulses: Normal pulses.     Heart sounds: Normal heart sounds.  Pulmonary:     Effort: Pulmonary effort is normal. No respiratory distress.     Breath sounds: Normal breath sounds.  Abdominal:     General: Bowel sounds are normal. There is no distension.     Palpations: Abdomen is soft.     Tenderness: There is no abdominal tenderness.  Musculoskeletal:     Cervical back: Neck supple.     Right lower leg: No edema.     Left lower leg: No edema.     Comments: Is able to move all extremities  Has poor fine motor abilities  Lymphadenopathy:     Cervical: No cervical adenopathy.  Skin:    General: Skin is warm and dry.     Comments: Right heel unstaged: 2 x 2.4 cm  Neurological:     Mental Status: She is alert and oriented to person, place, and time.  Psychiatric:        Mood and Affect: Mood normal.       ASSESSMENT/ PLAN:  TODAY  1. unstagable pressure ulcer right heel 2. Aortic atherosclerosis  3. Failure to thrive in adult  Will continue current medications Will continue current plan of care Will continue to monitor her status.    MD is aware of resident's narcotic use and is in agreement with current plan of care. We will attempt to wean resident as appropriate.  Ok Edwards NP [facility]  Contact [phone] Monday through Friday 8am- 5pm  After hours call [phone]

## 2020-04-05 DIAGNOSIS — R270 Ataxia, unspecified: Secondary | ICD-10-CM | POA: Diagnosis not present

## 2020-04-05 DIAGNOSIS — M4802 Spinal stenosis, cervical region: Secondary | ICD-10-CM | POA: Diagnosis not present

## 2020-04-05 DIAGNOSIS — R279 Unspecified lack of coordination: Secondary | ICD-10-CM | POA: Diagnosis not present

## 2020-04-05 DIAGNOSIS — I82402 Acute embolism and thrombosis of unspecified deep veins of left lower extremity: Secondary | ICD-10-CM | POA: Diagnosis not present

## 2020-04-05 DIAGNOSIS — M6281 Muscle weakness (generalized): Secondary | ICD-10-CM | POA: Diagnosis not present

## 2020-04-07 DIAGNOSIS — R279 Unspecified lack of coordination: Secondary | ICD-10-CM | POA: Diagnosis not present

## 2020-04-07 DIAGNOSIS — M4802 Spinal stenosis, cervical region: Secondary | ICD-10-CM | POA: Diagnosis not present

## 2020-04-07 DIAGNOSIS — R270 Ataxia, unspecified: Secondary | ICD-10-CM | POA: Diagnosis not present

## 2020-04-07 DIAGNOSIS — I82402 Acute embolism and thrombosis of unspecified deep veins of left lower extremity: Secondary | ICD-10-CM | POA: Diagnosis not present

## 2020-04-07 DIAGNOSIS — M6281 Muscle weakness (generalized): Secondary | ICD-10-CM | POA: Diagnosis not present

## 2020-04-08 ENCOUNTER — Encounter: Payer: Self-pay | Admitting: Adult Health

## 2020-04-08 DIAGNOSIS — M4802 Spinal stenosis, cervical region: Secondary | ICD-10-CM | POA: Diagnosis not present

## 2020-04-08 DIAGNOSIS — R270 Ataxia, unspecified: Secondary | ICD-10-CM | POA: Diagnosis not present

## 2020-04-08 DIAGNOSIS — R279 Unspecified lack of coordination: Secondary | ICD-10-CM | POA: Diagnosis not present

## 2020-04-08 DIAGNOSIS — M6281 Muscle weakness (generalized): Secondary | ICD-10-CM | POA: Diagnosis not present

## 2020-04-08 DIAGNOSIS — I82402 Acute embolism and thrombosis of unspecified deep veins of left lower extremity: Secondary | ICD-10-CM | POA: Diagnosis not present

## 2020-04-08 NOTE — Telephone Encounter (Signed)
I replied to patients family member. I am also forwarding to Gerlene Fee, NP as a Juluis Rainier.

## 2020-04-10 DIAGNOSIS — M6281 Muscle weakness (generalized): Secondary | ICD-10-CM | POA: Diagnosis not present

## 2020-04-10 DIAGNOSIS — R279 Unspecified lack of coordination: Secondary | ICD-10-CM | POA: Diagnosis not present

## 2020-04-10 DIAGNOSIS — R270 Ataxia, unspecified: Secondary | ICD-10-CM | POA: Diagnosis not present

## 2020-04-10 DIAGNOSIS — I82402 Acute embolism and thrombosis of unspecified deep veins of left lower extremity: Secondary | ICD-10-CM | POA: Diagnosis not present

## 2020-04-10 DIAGNOSIS — M4802 Spinal stenosis, cervical region: Secondary | ICD-10-CM | POA: Diagnosis not present

## 2020-04-11 ENCOUNTER — Encounter: Payer: Medicare Other | Admitting: Diagnostic Neuroimaging

## 2020-04-11 ENCOUNTER — Encounter: Payer: Self-pay | Admitting: Diagnostic Neuroimaging

## 2020-04-11 DIAGNOSIS — I82402 Acute embolism and thrombosis of unspecified deep veins of left lower extremity: Secondary | ICD-10-CM | POA: Diagnosis not present

## 2020-04-11 DIAGNOSIS — R270 Ataxia, unspecified: Secondary | ICD-10-CM | POA: Diagnosis not present

## 2020-04-11 DIAGNOSIS — M4802 Spinal stenosis, cervical region: Secondary | ICD-10-CM | POA: Diagnosis not present

## 2020-04-11 DIAGNOSIS — R279 Unspecified lack of coordination: Secondary | ICD-10-CM | POA: Diagnosis not present

## 2020-04-11 DIAGNOSIS — M6281 Muscle weakness (generalized): Secondary | ICD-10-CM | POA: Diagnosis not present

## 2020-04-12 ENCOUNTER — Non-Acute Institutional Stay (SKILLED_NURSING_FACILITY): Payer: Medicare Other | Admitting: Adult Health

## 2020-04-12 DIAGNOSIS — I82402 Acute embolism and thrombosis of unspecified deep veins of left lower extremity: Secondary | ICD-10-CM | POA: Diagnosis not present

## 2020-04-12 DIAGNOSIS — I129 Hypertensive chronic kidney disease with stage 1 through stage 4 chronic kidney disease, or unspecified chronic kidney disease: Secondary | ICD-10-CM

## 2020-04-12 DIAGNOSIS — H40053 Ocular hypertension, bilateral: Secondary | ICD-10-CM

## 2020-04-12 DIAGNOSIS — E1122 Type 2 diabetes mellitus with diabetic chronic kidney disease: Secondary | ICD-10-CM

## 2020-04-12 DIAGNOSIS — R270 Ataxia, unspecified: Secondary | ICD-10-CM | POA: Diagnosis not present

## 2020-04-12 DIAGNOSIS — M4802 Spinal stenosis, cervical region: Secondary | ICD-10-CM | POA: Diagnosis not present

## 2020-04-12 DIAGNOSIS — N183 Chronic kidney disease, stage 3 unspecified: Secondary | ICD-10-CM

## 2020-04-12 DIAGNOSIS — D631 Anemia in chronic kidney disease: Secondary | ICD-10-CM

## 2020-04-12 DIAGNOSIS — R279 Unspecified lack of coordination: Secondary | ICD-10-CM | POA: Diagnosis not present

## 2020-04-12 DIAGNOSIS — M6281 Muscle weakness (generalized): Secondary | ICD-10-CM | POA: Diagnosis not present

## 2020-04-12 NOTE — Progress Notes (Signed)
Location:    [place] Room Number: 150/P Place of Service:  SNF (31)   CODE STATUS: DNR  Allergies  Allergen Reactions  . Latex Rash  . Levofloxacin Nausea And Vomiting  . Neomycin-Bacitracin-Polymyxin  [Bacitracin-Neomycin-Polymyxin] Rash  . Other Rash and Swelling  . Prednisone Hives, Itching, Rash and Other (See Comments)  . Fish Allergy     rash  . Neosporin [Neomycin-Polymyxin-Gramicidin] Itching and Rash    Chief Complaint  Patient presents with  . Medical Management of Chronic Issues            Hypertension associated with stage 3 chronic kidney disease due to type 2 diabetes mellitus:       Anemia due to chronic renal failure treated erythropoietin stage 3:    Increased intraocular pressure bilateral     HPI:  She is a 74 year old long term resident of this facility being seen for the management of her chronic illnesses: Hypertension associated with stage 3 chronic kidney disease due to type 2 diabetes mellitus:       Anemia due to chronic renal failure treated erythropoietin stage 3:    Increased intraocular pressure bilateral. There are no reports of uncontrolled pain; she is able to feed herself; no reports of anxiety or agitation. She has canceled her neurology appointments.   Past Medical History:  Diagnosis Date  . Allergy   . Anemia    pernicious anemia  . Asthma   . Blood transfusion without reported diagnosis   . CKD (chronic kidney disease)   . COPD (chronic obstructive pulmonary disease) ([place]) 12/23/2019  . Gastritis   . Glaucoma   . History of blood in urine   . Hyperlipidemia   . Hypertension   . Ocular hypertension   . Osteoporosis   . Type 2 diabetes mellitus ([place]) 12/23/2019  . Vitamin B12 deficiency anemia due to intrinsic factor deficiency     Past Surgical History:  Procedure Laterality Date  . CHOLECYSTECTOMY  2008  . COLONOSCOPY  04/30/2011   Procedure: COLONOSCOPY;  Surgeon: Rogene Houston, MD;  Location: [facility];  Service: Endoscopy;  Laterality: N/A;  . COLONOSCOPY  05/25/2012   Procedure: COLONOSCOPY;  Surgeon: Rogene Houston, MD;  Location: [facility];  Service: Endoscopy;  Laterality: N/A;  1:25-changed to 1200 Ann to notify pt  . COLONOSCOPY Bilateral 12/2017  . ESOPHAGOGASTRODUODENOSCOPY N/A 12/07/2019   Procedure: ESOPHAGOGASTRODUODENOSCOPY (EGD);  Surgeon: Rogene Houston, MD;  Location: [facility];  Service: Endoscopy;  Laterality: N/A;  155  . GALLBLADDER SURGERY    . right breast cystectomy  1988  . TUBAL LIGATION  1975    Social History   Socioeconomic History  . Marital status: Divorced    Spouse name: Not on file  . Number of children: 4  . Years of education: 9  . Highest education level: Not on file  Occupational History  . Not on file  Tobacco Use  . Smoking status: Former Smoker    Packs/day: 1.00    Years: 40.00    Pack years: 40.00    Quit date: 04/03/2011    Years since quitting: 9.0  . Smokeless tobacco: Never Used  Vaping Use  . Vaping Use: Never used  Substance and Sexual Activity  . Alcohol use: No  . Drug use: No  . Sexual activity: Not Currently  Other Topics Concern  . Not on file  Social History Narrative   01/17/20 lives  at [place], [place]   Social Determinants of Health   Financial Resource Strain:   . Difficulty of Paying Living Expenses: Not on file  Food Insecurity:   . Worried About Charity fundraiser in the Last Year: Not on file  . Ran Out of Food in the Last Year: Not on file  Transportation Needs:   . Lack of Transportation (Medical): Not on file  . Lack of Transportation (Non-Medical): Not on file  Physical Activity:   . Days of Exercise per Week: Not on file  . Minutes of Exercise per Session: Not on file  Stress:   . Feeling of Stress : Not on file  Social Connections:   . Frequency of Communication with Friends and Family: Not on file  . Frequency of Social Gatherings with Friends and Family: Not on  file  . Attends Religious Services: Not on file  . Active Member of Clubs or Organizations: Not on file  . Attends Archivist Meetings: Not on file  . Marital Status: Not on file  Intimate Partner Violence:   . Fear of Current or Ex-Partner: Not on file  . Emotionally Abused: Not on file  . Physically Abused: Not on file  . Sexually Abused: Not on file   Family History  Problem Relation Age of Onset  . Diabetes Mother   . Hyperlipidemia Mother   . Hypertension Mother   . Heart disease Father   . Hyperlipidemia Father   . Diabetes Sister   . Hyperlipidemia Sister   . Hypertension Sister   . Cancer Brother   . Heart disease Brother   . Hyperlipidemia Brother   . Hypertension Brother   . Diabetes Sister       VITAL SIGNS BP 101/60   Pulse 92   Temp 98 F (36.7 C)   Resp 16   Ht 5\' 4"  (1.626 m)   Wt 97 lb 9.6 oz (44.3 kg)   SpO2 93%   BMI 16.75 kg/m   Outpatient Encounter Medications as of 04/12/2020  Medication Sig  . acetaminophen (TYLENOL) 325 MG tablet Take 2 tablets (650 mg total) by mouth every 6 (six) hours as needed for mild pain (or Fever >/= 101).  Marland Kitchen albuterol (VENTOLIN HFA) 108 (90 Base) MCG/ACT inhaler Inhale 2 puffs into the lungs every 4 (four) hours as needed for wheezing or shortness of breath.  Marland Kitchen amLODipine (NORVASC) 10 MG tablet Take 1 tablet (10 mg total) by mouth daily.  Marland Kitchen apixaban (ELIQUIS) 5 MG TABS tablet Take 1 tablet (5 mg total) by mouth 2 (two) times daily.  Roseanne Kaufman [place]-Castor Oil (VENELEX) OINT Apply topically 3 (three) times daily. Special Instructions: Apply to sacrum and bilateral buttocks qshift for prevention.  . cyanocobalamin (,VITAMIN B-12,) 1000 MCG/ML injection Inject 1 mL (1,000 mcg total) into the muscle every 30 (thirty) days.  . dorzolamide (TRUSOPT) 2 % ophthalmic solution Place 1 drop into both eyes 2 (two) times daily.  . feeding supplement, ENSURE ENLIVE, (ENSURE ENLIVE) LIQD Take 237 mLs by mouth 2 (two) times  daily between meals.  . folic acid (FOLVITE) 1 MG tablet Take 1 mg by mouth daily.   Marland Kitchen gabapentin (NEURONTIN) 100 MG capsule Take 100 mg by mouth at bedtime.  Marland Kitchen latanoprost (XALATAN) 0.005 % ophthalmic solution Place 1 drop into both eyes at bedtime.  . metoCLOPramide (REGLAN) 5 MG tablet Take 1 tablet (5 mg total) by mouth 3 (three) times daily before meals.  Marland Kitchen  NON FORMULARY Dys 2 (ground meats), add extra gravy to meats ALLERGIC TO FISH  . omeprazole (PRILOSEC) 40 MG capsule Take 40 mg by mouth daily.   . ondansetron (ZOFRAN ODT) 4 MG disintegrating tablet Take 1 tablet (4 mg total) by mouth every 8 (eight) hours as needed for nausea or vomiting. Dissolve under tongue   No facility-administered encounter medications on file as of 04/12/2020.     SIGNIFICANT DIAGNOSTIC EXAMS   PREVIOUS   12-23-19: chest x-ray: No active disease.   12-23-19: ct angio of chest:  1. No evidence of pulmonary embolism. 2. Paraseptal emphysema, mild and worse at the lung apices. 3. 3 mm nodule along the fissure in the RIGHT lower lobe. . 4. Moderate and with 3.6 cm caliber of ascending thoracic aorta.. Aortic aneurysm  5. Emphysema and aortic atherosclerosis  02-02-20: chest x-ray: COPD changes without acute infiltrate   02-02-20: bilateral lower extremity venous ultrasound:  Extensive acute appearing left femoropopliteal occlusive DVT extending into the calf veins. Negative for right lower extremity DVT  02-05-20: MRI cervical spine:  1. Widespread cervical spine degeneration. No acute osseous abnormality. 2. Multilevel mild to moderate spinal stenosis and spinal cord mass effect at C3-C4 through C6-C7, with possible spinal cord myelomalacia at the C6 level. No other cord signal abnormality. 3. Moderate or severe degenerative neural foraminal stenosis at the bilateral C5 and C6 nerve levels.   02-05-20: MRI thoracic spine:  Normal for age MRI appearance of the thoracic spine   02-06-20: ct of chest  abdomen and pelvis:  1. No definite evidence of malignancy in the chest, abdomen, or pelvis. 2. Unchanged small bilateral lung nodules measuring up to 3 mm. Non-contrast chest CT can be considered in 12 months.  3. Large stool burden with rectal distension and presacral inflammation which may reflect stercoral proctitis. 4. Known left lower extremity DVT. 5. Nonobstructing nephrolithiasis. 6. Aortic Atherosclerosis and Emphysema .  NO NEW EXAMS.     LABS REVIEWED PREVIOUS   12-23-19: wbc 8.2; hgb 7.3; hct 25.2; mcv 58.3 plt 220; glucose 114; bun 15; creat 1.46; k+ 3.2; na++ 137 ca 9.4 liver normal albumin 4.1 d-dimer 0.86; mag 1.5; phos 3.3; urine culture: e-coli 12-25-19: wbc 8.2; hgb 7.1; hct 23.5; mcv 57.6 plt 215; glucose 111; bun 8; creat 1.08; k+ 4.2; na++ 135 ca 8.8; mag 1.6 12-26-19: wbc 7.9; hgb 9.5; hct 30.0 mcv 63.6 plt 205; glucose 110; bun 13; creat 1.21; k+ 4.1; na++ 136; ca 9.2 phos 3.4 albumin 3.6  01-01-20: wbc 10.8; hgb 9.9; hct 32.5; mcv 65.4 plt 282; glucose 99; bun 21; creat 1.41; k+ 3.9; na++ 133;ca 9.1 mag 1.9 free t4: 1.37 free t3: 2.1 tsh 7.150 01-08-20: ANA neg; CRP 15.6 01-09-20: wbc 8.2; hgb 8.2; hct 25.9; mcv 66.6 plt 238;  01-10-20: wbc 10.2; hgb 8.9; hct 28.2; mcv 67.6 plt 290; gucose 117; bun 25; creat 1.59; k+ 4.2; na++ 138; ca 9.2; live normal albumin 3.4 mag 1.9 RPR neg 02-01-20: wbc 10.6; hgb 28.1; mcv 74.1 plt 271; glucose 140 bun 27; creat 1.28; k+ 4.4; an++ 120; ca 8.9 liver normal albumin 3.4 mag 1.9 02-04-20: wbc 4.7; hgb 9.2; hct 28.7; mcv 78.2 plt 273 02-05-20: wbc 5.0; hgb 9.4; hct 29.2; mcv 78.3 plt 270; glucose 95; bun 19; creat 0.90; k+ 3.5; na++ 135; ca 8.6 liver normal albumin 2.6 02-08-20; glucose 85; bun 13; creat 0.91; k+ 3.6; na++ 134; ca 8.6 02-14-20: tsh 6.564 free T3: 2.2 free T4: 1.80 02-16-20:  wbc 5.9; hgb 9.6; hct 30.3; mcv 81.9 plt 312; glucose 86; bun 15; creat 1.07; k+ 3.5; na++ 137; ca 8.4   TODAY  03-01-20: wbc 7.3; hgb 10.1; hct 31.8; mcv  86.6 plt 195; glucose 100; bun 16; creat 1.08 ;k+ 3.5; na++ 137; ca 8.8  03-06-20; wbc 8.4; hgb 11.0; hct 35.7; mcv 87.1 plt 205; glucose 112; bun 17; creat 1.18; k+ 3.8; na++ 134; ca 9.2 liver normal albumin 3.5; ferritin 966; iron 24; tibc 158 vit B 12: 1294; vit D 41.02 03-20-20; wbc 10.9; hgb 10.5; hct 34.3; mcv 89.8 plt 293; glucose 114; bun 18; creat 1.11; k+ 4.4; na++ 139; ca 9.5 liver normal albumin 3.4 vit B 12: 1376; vit D 45.43  04-03-20: wbc 8.7; hgb 10.5; hct 34.3; mcv 91.7 plt 324    Review of Systems  Constitutional: Negative for malaise/fatigue.  Respiratory: Negative for cough and shortness of breath.   Cardiovascular: Negative for chest pain, palpitations and leg swelling.  Gastrointestinal: Negative for abdominal pain, constipation and heartburn.  Musculoskeletal: Negative for back pain, joint pain and myalgias.  Skin: Negative.   Neurological: Negative for dizziness.  Psychiatric/Behavioral: The patient is not nervous/anxious.       Physical Exam Constitutional:      General: She is not in acute distress.    Appearance: She is well-developed. She is not diaphoretic.  Neck:     Thyroid: No thyromegaly.  Cardiovascular:     Rate and Rhythm: Normal rate and regular rhythm.     Heart sounds: Normal heart sounds.  Pulmonary:     Effort: Pulmonary effort is normal. No respiratory distress.     Breath sounds: Normal breath sounds.  Abdominal:     General: Bowel sounds are normal. There is no distension.     Palpations: Abdomen is soft.     Tenderness: There is no abdominal tenderness.  Musculoskeletal:     Cervical back: Neck supple.     Right lower leg: No edema.     Left lower leg: No edema.     Comments: Is able to move all extremities  Has poor fine motor abilities   Lymphadenopathy:     Cervical: No cervical adenopathy.  Skin:    General: Skin is warm and dry.     Comments: Right heel unstaged: 2 x 2.4 cm  Neurological:     Mental Status: She is alert and  oriented to person, place, and time.  Psychiatric:        Mood and Affect: Mood normal.       ASSESSMENT/ PLAN:  TODAY  1. Hypertension associated with stage 3 chronic kidney disease due to type 2 diabetes mellitus: is stable b/p 101/60 will continue norvasc 10 mg daily   2. Anemia due to chronic renal failure treated erythropoietin stage 3: is stable hgb 10.5 will continue to monitor is treated per oncology   3. Increased intraocular pressure bilateral is stable will continue trusopt to both eyes twice daily and xalatan to eyes nightly     PREVIOUS   4. Vit B 12: deficiency: is stable will continue monthly b12 injections and folic acid 1 mg daily   5. Failure to thrive in adult is without change  is off megace due to dvt. Has completed remeron therapy weight is presently 97 pounds   6. Aortic atherosclerosis: is stable will monitor   7. Acute deep vein thrombosis (DVT) of proximal vein of left lower extremity: diagnosed 02-02-20 will continue eliquis 5  mg twice daily   8. Chronic obstructive pulmonary disease unspecified COPD type: is stable will continue albuterol 2 puffs every 4 hours as needed  9. Peripheral neuropathy: is stable will continue gabapentin 100 mg nightly   10. CKD stage 3 due to diabetes mellitus type 2: is stable bun 18; creat 1.11  11. Type 2 diabetes mellitus with stage 3 chronic kidney disease without long term current use  of insulin: hgb a1c 5.2 will monitor   12. Other chronic gastritis without hemorrhage:is stable will continue prilosec 40 mg daily reglan 5 mg three times daily        MD is aware of resident's narcotic use and is in agreement with current plan of care. We will attempt to wean resident as appropriate.  Ok Edwards NP [facility]  Contact [phone] Monday through Friday 8am- 5pm  After hours call [phone]

## 2020-04-14 DIAGNOSIS — M6281 Muscle weakness (generalized): Secondary | ICD-10-CM | POA: Diagnosis not present

## 2020-04-14 DIAGNOSIS — R270 Ataxia, unspecified: Secondary | ICD-10-CM | POA: Diagnosis not present

## 2020-04-14 DIAGNOSIS — I82402 Acute embolism and thrombosis of unspecified deep veins of left lower extremity: Secondary | ICD-10-CM | POA: Diagnosis not present

## 2020-04-14 DIAGNOSIS — R279 Unspecified lack of coordination: Secondary | ICD-10-CM | POA: Diagnosis not present

## 2020-04-14 DIAGNOSIS — M4802 Spinal stenosis, cervical region: Secondary | ICD-10-CM | POA: Diagnosis not present

## 2020-04-15 ENCOUNTER — Encounter: Payer: Self-pay | Admitting: Adult Health

## 2020-04-15 DIAGNOSIS — I82402 Acute embolism and thrombosis of unspecified deep veins of left lower extremity: Secondary | ICD-10-CM | POA: Diagnosis not present

## 2020-04-15 DIAGNOSIS — R270 Ataxia, unspecified: Secondary | ICD-10-CM | POA: Diagnosis not present

## 2020-04-15 DIAGNOSIS — M6281 Muscle weakness (generalized): Secondary | ICD-10-CM | POA: Diagnosis not present

## 2020-04-15 DIAGNOSIS — M4802 Spinal stenosis, cervical region: Secondary | ICD-10-CM | POA: Diagnosis not present

## 2020-04-15 DIAGNOSIS — R279 Unspecified lack of coordination: Secondary | ICD-10-CM | POA: Diagnosis not present

## 2020-04-16 DIAGNOSIS — M6281 Muscle weakness (generalized): Secondary | ICD-10-CM | POA: Diagnosis not present

## 2020-04-16 DIAGNOSIS — R279 Unspecified lack of coordination: Secondary | ICD-10-CM | POA: Diagnosis not present

## 2020-04-16 DIAGNOSIS — I82402 Acute embolism and thrombosis of unspecified deep veins of left lower extremity: Secondary | ICD-10-CM | POA: Diagnosis not present

## 2020-04-16 DIAGNOSIS — M4802 Spinal stenosis, cervical region: Secondary | ICD-10-CM | POA: Diagnosis not present

## 2020-04-16 DIAGNOSIS — R270 Ataxia, unspecified: Secondary | ICD-10-CM | POA: Diagnosis not present

## 2020-04-17 ENCOUNTER — Inpatient Hospital Stay (HOSPITAL_COMMUNITY): Payer: Medicare Other

## 2020-04-17 ENCOUNTER — Inpatient Hospital Stay (HOSPITAL_COMMUNITY): Payer: Medicare Other | Attending: Hematology

## 2020-04-17 ENCOUNTER — Other Ambulatory Visit: Payer: Self-pay

## 2020-04-17 VITALS — BP 120/70 | HR 105 | Temp 97.2°F | Resp 16

## 2020-04-17 DIAGNOSIS — D631 Anemia in chronic kidney disease: Secondary | ICD-10-CM | POA: Insufficient documentation

## 2020-04-17 DIAGNOSIS — R279 Unspecified lack of coordination: Secondary | ICD-10-CM | POA: Diagnosis not present

## 2020-04-17 DIAGNOSIS — N183 Chronic kidney disease, stage 3 unspecified: Secondary | ICD-10-CM | POA: Insufficient documentation

## 2020-04-17 DIAGNOSIS — D509 Iron deficiency anemia, unspecified: Secondary | ICD-10-CM

## 2020-04-17 DIAGNOSIS — E860 Dehydration: Secondary | ICD-10-CM

## 2020-04-17 DIAGNOSIS — N189 Chronic kidney disease, unspecified: Secondary | ICD-10-CM

## 2020-04-17 DIAGNOSIS — R270 Ataxia, unspecified: Secondary | ICD-10-CM | POA: Diagnosis not present

## 2020-04-17 DIAGNOSIS — M6281 Muscle weakness (generalized): Secondary | ICD-10-CM | POA: Diagnosis not present

## 2020-04-17 DIAGNOSIS — I82402 Acute embolism and thrombosis of unspecified deep veins of left lower extremity: Secondary | ICD-10-CM | POA: Diagnosis not present

## 2020-04-17 DIAGNOSIS — M4802 Spinal stenosis, cervical region: Secondary | ICD-10-CM | POA: Diagnosis not present

## 2020-04-17 LAB — CBC
HCT: 32.9 % — ABNORMAL LOW (ref 36.0–46.0)
Hemoglobin: 10.4 g/dL — ABNORMAL LOW (ref 12.0–15.0)
MCH: 29.1 pg (ref 26.0–34.0)
MCHC: 31.6 g/dL (ref 30.0–36.0)
MCV: 92.2 fL (ref 80.0–100.0)
Platelets: 270 10*3/uL (ref 150–400)
RBC: 3.57 MIL/uL — ABNORMAL LOW (ref 3.87–5.11)
RDW: 15.8 % — ABNORMAL HIGH (ref 11.5–15.5)
WBC: 7.6 10*3/uL (ref 4.0–10.5)
nRBC: 0 % (ref 0.0–0.2)

## 2020-04-17 MED ORDER — EPOETIN ALFA-EPBX 10000 UNIT/ML IJ SOLN
10000.0000 [IU] | Freq: Once | INTRAMUSCULAR | Status: AC
  Administered 2020-04-17: 10000 [IU] via SUBCUTANEOUS
  Filled 2020-04-17: qty 1

## 2020-04-17 NOTE — Progress Notes (Signed)
Patient tolerated Retacrit injection with no complaints voiced.  Site clean and dry with no bruising or swelling noted.  No complaints of pain.  Discharged with vital signs stable and no signs or symptoms of distress noted.  

## 2020-04-18 DIAGNOSIS — M4802 Spinal stenosis, cervical region: Secondary | ICD-10-CM | POA: Diagnosis not present

## 2020-04-18 DIAGNOSIS — R279 Unspecified lack of coordination: Secondary | ICD-10-CM | POA: Diagnosis not present

## 2020-04-18 DIAGNOSIS — M6281 Muscle weakness (generalized): Secondary | ICD-10-CM | POA: Diagnosis not present

## 2020-04-18 DIAGNOSIS — R270 Ataxia, unspecified: Secondary | ICD-10-CM | POA: Diagnosis not present

## 2020-04-18 DIAGNOSIS — I82402 Acute embolism and thrombosis of unspecified deep veins of left lower extremity: Secondary | ICD-10-CM | POA: Diagnosis not present

## 2020-04-19 ENCOUNTER — Telehealth (INDEPENDENT_AMBULATORY_CARE_PROVIDER_SITE_OTHER): Payer: Self-pay | Admitting: *Deleted

## 2020-04-19 NOTE — Telephone Encounter (Signed)
Spoke with the [facility], the patient and the family have been "begging  for her to have chopped solid food as this states better, she has not been eating too much as she has been on the puree diet".  I spoke with Tammy and explained to her that it was fine as long as the food is chopped in very thin pieces so she does not have any choking episodes.  Someone needs to monitor her while eating.  Tammy understood and agreed.  [name], MD Gastroenterology and Hepatology [facility]

## 2020-04-19 NOTE — Telephone Encounter (Signed)
Seaton Hofmann Ward , RN left a voice mail asking if there was anyway that this patient's diet may be changed to a Mechanical Soft Diet.  Patient was last seen in this office by Dr.Castaneda on 03/14/2020. It was written that the patient be on a Puree Diet due to having no dentures or that the patient cannot chew solid foods thoroughly.  Per the nurse at the [facility] the patient cannot tolerate the puree diet. She is asking on behalf of the patient for a verbal order or a order be faxed to her attention (Antavia Tandy Ward , RN ) at [phone] , to change to the Mechanical Soft Diet.  Forwarded to [facility] to address and reach out to [employer] , Therapist, sports at [facility].

## 2020-04-22 NOTE — Telephone Encounter (Signed)
Noted  

## 2020-04-30 ENCOUNTER — Encounter: Payer: Self-pay | Admitting: Adult Health

## 2020-05-01 ENCOUNTER — Ambulatory Visit (HOSPITAL_COMMUNITY): Payer: Medicare Other

## 2020-05-01 ENCOUNTER — Other Ambulatory Visit (HOSPITAL_COMMUNITY): Payer: Medicare Other

## 2020-05-01 ENCOUNTER — Ambulatory Visit (HOSPITAL_COMMUNITY): Payer: Medicare Other | Admitting: Nurse Practitioner

## 2020-05-02 ENCOUNTER — Inpatient Hospital Stay (HOSPITAL_BASED_OUTPATIENT_CLINIC_OR_DEPARTMENT_OTHER): Payer: Medicare Other | Admitting: Oncology

## 2020-05-02 ENCOUNTER — Encounter: Payer: Self-pay | Admitting: Adult Health

## 2020-05-02 ENCOUNTER — Inpatient Hospital Stay (HOSPITAL_COMMUNITY): Payer: Medicare Other

## 2020-05-02 ENCOUNTER — Non-Acute Institutional Stay (SKILLED_NURSING_FACILITY): Payer: Medicare Other | Admitting: Adult Health

## 2020-05-02 ENCOUNTER — Other Ambulatory Visit: Payer: Self-pay

## 2020-05-02 DIAGNOSIS — E860 Dehydration: Secondary | ICD-10-CM

## 2020-05-02 DIAGNOSIS — D509 Iron deficiency anemia, unspecified: Secondary | ICD-10-CM

## 2020-05-02 DIAGNOSIS — E1122 Type 2 diabetes mellitus with diabetic chronic kidney disease: Secondary | ICD-10-CM | POA: Diagnosis not present

## 2020-05-02 DIAGNOSIS — G6289 Other specified polyneuropathies: Secondary | ICD-10-CM

## 2020-05-02 DIAGNOSIS — I129 Hypertensive chronic kidney disease with stage 1 through stage 4 chronic kidney disease, or unspecified chronic kidney disease: Secondary | ICD-10-CM

## 2020-05-02 DIAGNOSIS — N183 Chronic kidney disease, stage 3 unspecified: Secondary | ICD-10-CM

## 2020-05-02 DIAGNOSIS — I824Y2 Acute embolism and thrombosis of unspecified deep veins of left proximal lower extremity: Secondary | ICD-10-CM

## 2020-05-02 DIAGNOSIS — D631 Anemia in chronic kidney disease: Secondary | ICD-10-CM | POA: Diagnosis not present

## 2020-05-02 LAB — CBC
HCT: 33 % — ABNORMAL LOW (ref 36.0–46.0)
Hemoglobin: 10.3 g/dL — ABNORMAL LOW (ref 12.0–15.0)
MCH: 29.4 pg (ref 26.0–34.0)
MCHC: 31.2 g/dL (ref 30.0–36.0)
MCV: 94.3 fL (ref 80.0–100.0)
Platelets: 243 10*3/uL (ref 150–400)
RBC: 3.5 MIL/uL — ABNORMAL LOW (ref 3.87–5.11)
RDW: 14.6 % (ref 11.5–15.5)
WBC: 7.2 10*3/uL (ref 4.0–10.5)
nRBC: 0 % (ref 0.0–0.2)

## 2020-05-02 MED ORDER — EPOETIN ALFA-EPBX 10000 UNIT/ML IJ SOLN
INTRAMUSCULAR | Status: AC
  Filled 2020-05-02: qty 1

## 2020-05-02 MED ORDER — EPOETIN ALFA-EPBX 10000 UNIT/ML IJ SOLN
10000.0000 [IU] | Freq: Once | INTRAMUSCULAR | Status: AC
  Administered 2020-05-02: 10000 [IU] via SUBCUTANEOUS

## 2020-05-02 NOTE — Progress Notes (Signed)
[address] [place], [place] [address]   CLINIC:  Medical Oncology/Hematology  PCP:  Gerlene Fee, NP [place] [address] [phone]   REASON FOR VISIT: Follow-up for microcytic anemia   CURRENT THERAPY: Epogen injections  INTERVAL HISTORY:  Ms. Fuquay 74 y.o. female returns for routine follow-up for microcytic anemia.  Patient still resides at the pain center.  She continues physical therapy.  She is hoping to return home at some point.  Unfortunately, her daughter lives in [place] and she needs somebody local to live with her.  She does not like living at the [facility] center.  She feels she is getting stronger.  She is able to ambulate with help and a walker.  She has trouble chewing foods due to lack of dentures.  She has neuropathy in bilateral hands. She denies any bright red bleeding per rectum or melena. She denies any easy bruising or bleeding. Denies any nausea, vomiting, or diarrhea. Denies any new pains. Had not noticed any recent bleeding such as epistaxis, hematuria or hematochezia. Denies recent chest pain on exertion, shortness of breath on minimal exertion, pre-syncopal episodes, or palpitations.  Denies any recent fevers, infections, or recent hospitalizations. Patient reports appetite at 50% and energy level at 50%. She is eating well maintain her weight is time.  REVIEW OF SYSTEMS:  Review of Systems  Cardiovascular: Positive for leg swelling.  Gastrointestinal: Positive for constipation.  Neurological: Positive for numbness.  All other systems reviewed and are negative.    PAST MEDICAL/SURGICAL HISTORY:  Past Medical History:  Diagnosis Date  . Allergy   . Anemia    pernicious anemia  . Asthma   . Blood transfusion without reported diagnosis   . CKD (chronic kidney disease)   . COPD (chronic obstructive pulmonary disease) ([place]) 12/23/2019  . Gastritis   . Glaucoma   . History of blood in urine   .  Hyperlipidemia   . Hypertension   . Ocular hypertension   . Osteoporosis   . Type 2 diabetes mellitus ([place]) 12/23/2019  . Vitamin B12 deficiency anemia due to intrinsic factor deficiency    Past Surgical History:  Procedure Laterality Date  . CHOLECYSTECTOMY  2008  . COLONOSCOPY  04/30/2011   Procedure: COLONOSCOPY;  Surgeon: Rogene Houston, MD;  Location[facility];  Service: Endoscopy;  Laterality: N/A;  . COLONOSCOPY  05/25/2012   Procedure: COLONOSCOPY;  Surgeon: Rogene Houston, MD;  Location[facility];  Service: Endoscopy;  Laterality: N/A;  1:25-changed to 1200 Ann to notify pt  . COLONOSCOPY Bilateral 12/2017  . ESOPHAGOGASTRODUODENOSCOPY N/A 12/07/2019   Procedure: ESOPHAGOGASTRODUODENOSCOPY (EGD);  Surgeon: Rogene Houston, MD;  Location: [facility];  Service: Endoscopy;  Laterality: N/A;  155  . GALLBLADDER SURGERY    . right breast cystectomy  1988  . TUBAL LIGATION  1975     SOCIAL HISTORY:  Social History   Socioeconomic History  . Marital status: Divorced    Spouse name: Not on file  . Number of children: 4  . Years of education: 9  . Highest education level: Not on file  Occupational History  . Not on file  Tobacco Use  . Smoking status: Former Smoker    Packs/day: 1.00    Years: 40.00    Pack years: 40.00    Quit date: 04/03/2011    Years since quitting: 9.0  . Smokeless tobacco: Never Used  Vaping Use  .  Vaping Use: Never used  Substance and Sexual Activity  . Alcohol use: No  . Drug use: No  . Sexual activity: Not Currently  Other Topics Concern  . Not on file  Social History Narrative   01/17/20 lives at [facility], [place]   Social Determinants of Health   Financial Resource Strain:   . Difficulty of Paying Living Expenses: Not on file  Food Insecurity:   . Worried About Charity fundraiser in the Last Year: Not on file  . Ran Out of Food in the Last Year: Not on file  Transportation Needs:   . Lack of Transportation  (Medical): Not on file  . Lack of Transportation (Non-Medical): Not on file  Physical Activity:   . Days of Exercise per Week: Not on file  . Minutes of Exercise per Session: Not on file  Stress:   . Feeling of Stress : Not on file  Social Connections:   . Frequency of Communication with Friends and Family: Not on file  . Frequency of Social Gatherings with Friends and Family: Not on file  . Attends Religious Services: Not on file  . Active Member of Clubs or Organizations: Not on file  . Attends Archivist Meetings: Not on file  . Marital Status: Not on file  Intimate Partner Violence:   . Fear of Current or Ex-Partner: Not on file  . Emotionally Abused: Not on file  . Physically Abused: Not on file  . Sexually Abused: Not on file    FAMILY HISTORY:  Family History  Problem Relation Age of Onset  . Diabetes Mother   . Hyperlipidemia Mother   . Hypertension Mother   . Heart disease Father   . Hyperlipidemia Father   . Diabetes Sister   . Hyperlipidemia Sister   . Hypertension Sister   . Cancer Brother   . Heart disease Brother   . Hyperlipidemia Brother   . Hypertension Brother   . Diabetes Sister     CURRENT MEDICATIONS:  Outpatient Encounter Medications as of 05/02/2020  Medication Sig  . acetaminophen (TYLENOL) 325 MG tablet Take 2 tablets (650 mg total) by mouth every 6 (six) hours as needed for mild pain (or Fever >/= 101).  Marland Kitchen albuterol (VENTOLIN HFA) 108 (90 Base) MCG/ACT inhaler Inhale 2 puffs into the lungs every 4 (four) hours as needed for wheezing or shortness of breath.  Marland Kitchen amLODipine (NORVASC) 10 MG tablet Take 1 tablet (10 mg total) by mouth daily.  Marland Kitchen apixaban (ELIQUIS) 5 MG TABS tablet Take 1 tablet (5 mg total) by mouth 2 (two) times daily.  Roseanne Kaufman Peru-Castor Oil (VENELEX) OINT Apply topically 3 (three) times daily. Special Instructions: Apply to sacrum and bilateral buttocks qshift for prevention.  . cyanocobalamin (,VITAMIN B-12,) 1000  MCG/ML injection Inject 1 mL (1,000 mcg total) into the muscle every 30 (thirty) days.  . dorzolamide (TRUSOPT) 2 % ophthalmic solution Place 1 drop into both eyes 2 (two) times daily.  . feeding supplement, ENSURE ENLIVE, (ENSURE ENLIVE) LIQD Take 237 mLs by mouth 2 (two) times daily between meals.  . folic acid (FOLVITE) 1 MG tablet Take 1 mg by mouth daily.   Marland Kitchen gabapentin (NEURONTIN) 100 MG capsule Take 100 mg by mouth at bedtime.  Marland Kitchen latanoprost (XALATAN) 0.005 % ophthalmic solution Place 1 drop into both eyes at bedtime.  . metoCLOPramide (REGLAN) 5 MG tablet Take 1 tablet (5 mg total) by mouth 3 (three) times daily before meals.  Marland Kitchen  NON FORMULARY Mechanical soft diet ALLERGIC TO FISH  . omeprazole (PRILOSEC) 40 MG capsule Take 40 mg by mouth daily.   . ondansetron (ZOFRAN ODT) 4 MG disintegrating tablet Take 1 tablet (4 mg total) by mouth every 8 (eight) hours as needed for nausea or vomiting. Dissolve under tongue   No facility-administered encounter medications on file as of 05/02/2020.    ALLERGIES:  Allergies  Allergen Reactions  . Latex Rash  . Levofloxacin Nausea And Vomiting  . Neomycin-Bacitracin-Polymyxin  [Bacitracin-Neomycin-Polymyxin] Rash  . Other Rash and Swelling  . Prednisone Hives, Itching, Rash and Other (See Comments)  . Fish Allergy     rash  . Neosporin [Neomycin-Polymyxin-Gramicidin] Itching and Rash     PHYSICAL EXAM:  ECOG Performance status: 1  Vitals:   05/02/20 1126  BP: 112/73  Pulse: (!) 102  Resp: 16  Temp: (!) 97 F (36.1 C)  SpO2: 100%   Filed Weights   05/02/20 1126  Weight: 97 lb 9.6 oz (44.3 kg)   Physical Exam Constitutional:      Appearance: Normal appearance. She is normal weight.  Cardiovascular:     Rate and Rhythm: Normal rate and regular rhythm.     Heart sounds: Normal heart sounds.  Pulmonary:     Effort: Pulmonary effort is normal.     Breath sounds: Normal breath sounds.  Abdominal:     General: Bowel sounds are  normal.     Palpations: Abdomen is soft.  Musculoskeletal:        General: Normal range of motion.  Skin:    General: Skin is warm.  Neurological:     Mental Status: She is alert and oriented to person, place, and time. Mental status is at baseline.  Psychiatric:        Mood and Affect: Mood normal.        Behavior: Behavior normal.        Thought Content: Thought content normal.        Judgment: Judgment normal.      LABORATORY DATA:  I have reviewed the labs as listed.  CBC    Component Value Date/Time   WBC 7.2 05/02/2020 1109   RBC 3.50 (L) 05/02/2020 1109   HGB 10.3 (L) 05/02/2020 1109   HCT 33.0 (L) 05/02/2020 1109   PLT 243 05/02/2020 1109   MCV 94.3 05/02/2020 1109   MCH 29.4 05/02/2020 1109   MCHC 31.2 05/02/2020 1109   RDW 14.6 05/02/2020 1109   LYMPHSABS 3.0 04/03/2020 0843   MONOABS 0.6 04/03/2020 0843   EOSABS 0.4 04/03/2020 0843   BASOSABS 0.1 04/03/2020 0843   CMP Latest Ref Rng & Units 03/20/2020 03/06/2020 03/01/2020  Glucose 70 - 99 mg/dL 114(H) 112(H) 100(H)  BUN 8 - 23 mg/dL 18 17 16   Creatinine 0.44 - 1.00 mg/dL 1.11(H) 1.19(H) 1.08(H)  Sodium 135 - 145 mmol/L 139 134(L) 137  Potassium 3.5 - 5.1 mmol/L 4.4 3.8 3.5  Chloride 98 - 111 mmol/L 102 102 106  CO2 22 - 32 mmol/L 27 22 23   Calcium 8.9 - 10.3 mg/dL 9.5 9.2 8.8(L)  Total Protein 6.5 - 8.1 g/dL 6.6 6.5 -  Total Bilirubin 0.3 - 1.2 mg/dL 0.8 0.7 -  Alkaline Phos 38 - 126 U/L 61 60 -  AST 15 - 41 U/L 9(L) 9(L) -  ALT 0 - 44 U/L 6 8 -    All questions were answered to patient's stated satisfaction. Encouraged patient to call with any new concerns or  questions before his next visit to the cancer center and we can certain see him sooner, if needed.     ASSESSMENT & PLAN:  Anemia due to chronic renal failure treated with erythropoietin, stage 3 (moderate) 1.  Microcytic anemia: -Last colonoscopy was in June 2019 in [place], 3 polyps removed. -Etiology is from combination of CKD and relative  iron deficiency. -Initial work-up included SPEP which was negative.  Stool was negative for occult blood.  Hemoglobin electrophoresis showed severe microcytosis related to her anemia.  She could have underlying thalassemia/sickle cell trait. -She has a history of needing a blood transfusion in the early 2000's. -She denies any bright red bleeding per rectum or melena. -Labs from 05/02/2020 show a hemoglobin of 10.3 -We have taken over Epogen injections for Dr. Theador Hawthorne.  We have started her on 10,000 units every 2 weeks. -She will receive her injection today. -She will follow-up in 8 weeks with repeat labs and Epogen injection.  2.  CKD: -Ultrasound the kidneys on 08/22/2019 showed bilateral renal atrophy, severe atrophy of the right kidney. -Creatinine has been stable.  On 03/06/2020 creatinine was 1.19. -She is getting Epogen injections every 2 weeks 10,000 units. -She follows up with nephrology every few months.  3.  Q33 and folic acid deficiency: -She is taking folic acid 1 mg daily -She is doing B12 injections monthly.  Disposition: RTC every 2 weeks for labs and Epogen injection. RTC in 8 weeks for MD assessment, labs and Epogen injection.     Orders placed this encounter:  Orders Placed This Encounter  Procedures  . CBC   Greater than 50% was spent in counseling and coordination of care with this patient including but not limited to discussion of the relevant topics above (See A&P) including, but not limited to diagnosis and management of acute and chronic medical conditions.   Faythe Casa, NP 05/02/2020 4:05 PM

## 2020-05-02 NOTE — Assessment & Plan Note (Addendum)
1.  Microcytic anemia: -Last colonoscopy was in June 2019 in [place], 3 polyps removed. -Etiology is from combination of CKD and relative iron deficiency. -Initial work-up included SPEP which was negative.  Stool was negative for occult blood.  Hemoglobin electrophoresis showed severe microcytosis related to her anemia.  She could have underlying thalassemia/sickle cell trait. -She has a history of needing a blood transfusion in the early 2000's. -She denies any bright red bleeding per rectum or melena. -Labs from 05/02/2020 show a hemoglobin of 10.3 -We have taken over Epogen injections for Dr. Theador Hawthorne.  We have started her on 10,000 units every 2 weeks. -She will receive her injection today. -She will follow-up in 8 weeks with repeat labs and Epogen injection.  2.  CKD: -Ultrasound the kidneys on 08/22/2019 showed bilateral renal atrophy, severe atrophy of the right kidney. -Creatinine has been stable.  On 03/06/2020 creatinine was 1.19. -She is getting Epogen injections every 2 weeks 10,000 units. -She follows up with nephrology every few months.  3.  O46 and folic acid deficiency: -She is taking folic acid 1 mg daily -She is doing B12 injections monthly.  Disposition: RTC every 2 weeks for labs and Epogen injection. RTC in 8 weeks for MD assessment, labs and Epogen injection.

## 2020-05-02 NOTE — Patient Instructions (Signed)
[facility] at [facility] Discharge Instructions  Received Retacrit injection today. Follow-up as scheduled   Thank you for choosing [facility] at [facility] to provide your oncology and hematology care.  To afford each patient quality time with our provider, please arrive at least 15 minutes before your scheduled appointment time.   If you have a lab appointment with the Cancer Center please come in thru the Main Entrance and check in at the main information desk.  You need to re-schedule your appointment should you arrive 10 or more minutes late.  We strive to give you quality time with our providers, and arriving late affects you and other patients whose appointments are after yours.  Also, if you no show three or more times for appointments you may be dismissed from the clinic at the providers discretion.     Again, thank you for choosing [facility].  Our hope is that these requests will decrease the amount of time that you wait before being seen by our physicians.       _____________________________________________________________  Should you have questions after your visit to  Cancer Center, please contact our office at [phone] and follow the prompts.  Our office hours are 8:00 a.m. and 4:30 p.m. Monday - Friday.  Please note that voicemails left after 4:00 p.m. may not be returned until the following business day.  We are closed weekends and major holidays.  You do have access to a nurse 24-7, just call the main number to the clinic [phone] and do not press any options, hold on the line and a nurse will answer the phone.    For prescription refill requests, have your pharmacy contact our office and allow 72 hours.    Due to Covid, you will need to wear a mask upon entering the hospital. If you do not have a mask, a mask will be given to you at the Main Entrance upon arrival. For doctor visits, patients may  have 1 support person age 18 or older with them. For treatment visits, patients can not have anyone with them due to social distancing guidelines and our immunocompromised population.     

## 2020-05-02 NOTE — Progress Notes (Signed)
Patient seen by JBurns NP. Verbal order received to give Retacrit today. Hgb 10.3 today.

## 2020-05-02 NOTE — Progress Notes (Signed)
Traci Parker tolerated Retacrit injection well without complaints or incident. VSS Pt discharged via wheelchair in satisfactory condition

## 2020-05-02 NOTE — Progress Notes (Signed)
Location:    [facility] Room Number: 150/P Place of Service:  SNF (31)   CODE STATUS: DNR  Allergies  Allergen Reactions  . Latex Rash  . Levofloxacin Nausea And Vomiting  . Neomycin-Bacitracin-Polymyxin  [Bacitracin-Neomycin-Polymyxin] Rash  . Other Rash and Swelling  . Prednisone Hives, Itching, Rash and Other (See Comments)  . Fish Allergy     rash  . Neosporin [Neomycin-Polymyxin-Gramicidin] Itching and Rash    Chief Complaint  Patient presents with  . Acute Visit    Care Plan Meeting    HPI:  We have come together for a care plan meeting. Family present. BIMS 15/15 mood 6/30. Her weight is stable at 96.6 pounds. Her appetite is fair. She has had one fall without injury; is nonambulatory. Requires extensive to dependent for her adls; does feed self with supervision. She is incontinent of bladder and bowel. Her family is concerned about therapy. The recent goals that she met were: bed mobility; transfer with staff assist. She will not become independent with ambulation or transfers at this time due her lack of feeling in her lower extremities. She and her family had canceled her EMG study and follow up with neurology. They are interested in this test once more. She would like to go home; however; this is not a realistic goal for her. She denies any uncontrolled pain. She continues to be followed for her chronic illnesses including: Hypertension associated with stage 3 chronic kidney disease due to type 2 diabetes mellitus Acute deep vein thrombosis (DVT) of proximal vein of left lower extremity  Other polyneuropathy   Past Medical History:  Diagnosis Date  . Allergy   . Anemia    pernicious anemia  . Asthma   . Blood transfusion without reported diagnosis   . CKD (chronic kidney disease)   . COPD (chronic obstructive pulmonary disease) ([place]) 12/23/2019  . Gastritis   . Glaucoma   . History of blood in urine   . Hyperlipidemia   . Hypertension   .  Ocular hypertension   . Osteoporosis   . Type 2 diabetes mellitus ([place]) 12/23/2019  . Vitamin B12 deficiency anemia due to intrinsic factor deficiency     Past Surgical History:  Procedure Laterality Date  . CHOLECYSTECTOMY  2008  . COLONOSCOPY  04/30/2011   Procedure: COLONOSCOPY;  Surgeon: Rogene Houston, MD;  Location: [facility];  Service: Endoscopy;  Laterality: N/A;  . COLONOSCOPY  05/25/2012   Procedure: COLONOSCOPY;  Surgeon: Rogene Houston, MD;  Location: [facility];  Service: Endoscopy;  Laterality: N/A;  1:25-changed to 1200 Ann to notify pt  . COLONOSCOPY Bilateral 12/2017  . ESOPHAGOGASTRODUODENOSCOPY N/A 12/07/2019   Procedure: ESOPHAGOGASTRODUODENOSCOPY (EGD);  Surgeon: Rogene Houston, MD;  Location: [facility];  Service: Endoscopy;  Laterality: N/A;  155  . GALLBLADDER SURGERY    . right breast cystectomy  1988  . TUBAL LIGATION  1975    Social History   Socioeconomic History  . Marital status: Divorced    Spouse name: Not on file  . Number of children: 4  . Years of education: 9  . Highest education level: Not on file  Occupational History  . Not on file  Tobacco Use  . Smoking status: Former Smoker    Packs/day: 1.00    Years: 40.00    Pack years: 40.00    Quit date: 04/03/2011    Years since quitting: 9.0  . Smokeless tobacco: Never Used  Vaping Use  . Vaping Use: Never used  Substance and Sexual Activity  . Alcohol use: No  . Drug use: No  . Sexual activity: Not Currently  Other Topics Concern  . Not on file  Social History Narrative   [birth year] lives at [facility], [place]   Social Determinants of Health   Financial Resource Strain:   . Difficulty of Paying Living Expenses: Not on file  Food Insecurity:   . Worried About Charity fundraiser in the Last Year: Not on file  . Ran Out of Food in the Last Year: Not on file  Transportation Needs:   . Lack of Transportation (Medical): Not on file  . Lack of Transportation  (Non-Medical): Not on file  Physical Activity:   . Days of Exercise per Week: Not on file  . Minutes of Exercise per Session: Not on file  Stress:   . Feeling of Stress : Not on file  Social Connections:   . Frequency of Communication with Friends and Family: Not on file  . Frequency of Social Gatherings with Friends and Family: Not on file  . Attends Religious Services: Not on file  . Active Member of Clubs or Organizations: Not on file  . Attends Archivist Meetings: Not on file  . Marital Status: Not on file  Intimate Partner Violence:   . Fear of Current or Ex-Partner: Not on file  . Emotionally Abused: Not on file  . Physically Abused: Not on file  . Sexually Abused: Not on file   Family History  Problem Relation Age of Onset  . Diabetes Mother   . Hyperlipidemia Mother   . Hypertension Mother   . Heart disease Father   . Hyperlipidemia Father   . Diabetes Sister   . Hyperlipidemia Sister   . Hypertension Sister   . Cancer Brother   . Heart disease Brother   . Hyperlipidemia Brother   . Hypertension Brother   . Diabetes Sister       VITAL SIGNS BP 100/73   Pulse 100   Temp (!) 96.6 F (35.9 C)   Ht _0  (1.626 m)   Wt 96 lb 9.6 oz (43.8 kg)   BMI 16.58 kg/m   Outpatient Encounter Medications as of 05/02/2020  Medication Sig  . acetaminophen (TYLENOL) 325 MG tablet Take 2 tablets (650 mg total) by mouth every 6 (six) hours as needed for mild pain (or Fever >/= 101).  Marland Kitchen albuterol (VENTOLIN HFA) 108 (90 Base) MCG/ACT inhaler Inhale 2 puffs into the lungs every 4 (four) hours as needed for wheezing or shortness of breath.  Marland Kitchen amLODipine (NORVASC) 10 MG tablet Take 1 tablet (10 mg total) by mouth daily.  Marland Kitchen apixaban (ELIQUIS) 5 MG TABS tablet Take 1 tablet (5 mg total) by mouth 2 (two) times daily.  Roseanne Kaufman [place]-Castor Oil (VENELEX) OINT Apply topically 3 (three) times daily. Special Instructions: Apply to sacrum and bilateral buttocks qshift for  prevention.  . cyanocobalamin (,VITAMIN B-12,) 1000 MCG/ML injection Inject 1 mL (1,000 mcg total) into the muscle every 30 (thirty) days.  . dorzolamide (TRUSOPT) 2 % ophthalmic solution Place 1 drop into both eyes 2 (two) times daily.  . feeding supplement, ENSURE ENLIVE, (ENSURE ENLIVE) LIQD Take 237 mLs by mouth 2 (two) times daily between meals.  . folic acid (FOLVITE) 1 MG tablet Take 1 mg by mouth daily.   Marland Kitchen gabapentin (NEURONTIN) 100 MG capsule Take 100 mg by mouth at bedtime.  Marland Kitchen  latanoprost (XALATAN) 0.005 % ophthalmic solution Place 1 drop into both eyes at bedtime.  . metoCLOPramide (REGLAN) 5 MG tablet Take 1 tablet (5 mg total) by mouth 3 (three) times daily before meals.  . NON FORMULARY Mechanical soft diet ALLERGIC TO FISH  . omeprazole (PRILOSEC) 40 MG capsule Take 40 mg by mouth daily.   . ondansetron (ZOFRAN ODT) 4 MG disintegrating tablet Take 1 tablet (4 mg total) by mouth every 8 (eight) hours as needed for nausea or vomiting. Dissolve under tongue   No facility-administered encounter medications on file as of 05/02/2020.     SIGNIFICANT DIAGNOSTIC EXAMS   PREVIOUS   12-23-19: chest x-ray: No active disease.   12-23-19: ct angio of chest:  1. No evidence of pulmonary embolism. 2. Paraseptal emphysema, mild and worse at the lung apices. 3. 3 mm nodule along the fissure in the RIGHT lower lobe. . 4. Moderate and with 3.6 cm caliber of ascending thoracic aorta.. Aortic aneurysm  5. Emphysema and aortic atherosclerosis  02-02-20: chest x-ray: COPD changes without acute infiltrate   02-02-20: bilateral lower extremity venous ultrasound:  Extensive acute appearing left femoropopliteal occlusive DVT extending into the calf veins. Negative for right lower extremity DVT  02-05-20: MRI cervical spine:  1. Widespread cervical spine degeneration. No acute osseous abnormality. 2. Multilevel mild to moderate spinal stenosis and spinal cord mass effect at C3-C4 through C6-C7,  with possible spinal cord myelomalacia at the C6 level. No other cord signal abnormality. 3. Moderate or severe degenerative neural foraminal stenosis at the bilateral C5 and C6 nerve levels.   02-05-20: MRI thoracic spine:  Normal for age MRI appearance of the thoracic spine   02-06-20: ct of chest abdomen and pelvis:  1. No definite evidence of malignancy in the chest, abdomen, or pelvis. 2. Unchanged small bilateral lung nodules measuring up to 3 mm. Non-contrast chest CT can be considered in 12 months.  3. Large stool burden with rectal distension and presacral inflammation which may reflect stercoral proctitis. 4. Known left lower extremity DVT. 5. Nonobstructing nephrolithiasis. 6. Aortic Atherosclerosis and Emphysema .  NO NEW EXAMS.     LABS REVIEWED PREVIOUS   12-23-19: wbc 8.2; hgb 7.3; hct 25.2; mcv 58.3 plt 220; glucose 114; bun 15; creat 1.46; k+ 3.2; na++ 137 ca 9.4 liver normal albumin 4.1 d-dimer 0.86; mag 1.5; phos 3.3; urine culture: e-coli 12-25-19: wbc 8.2; hgb 7.1; hct 23.5; mcv 57.6 plt 215; glucose 111; bun 8; creat 1.08; k+ 4.2; na++ 135 ca 8.8; mag 1.6 12-26-19: wbc 7.9; hgb 9.5; hct 30.0 mcv 63.6 plt 205; glucose 110; bun 13; creat 1.21; k+ 4.1; na++ 136; ca 9.2 phos 3.4 albumin 3.6  01-01-20: wbc 10.8; hgb 9.9; hct 32.5; mcv 65.4 plt 282; glucose 99; bun 21; creat 1.41; k+ 3.9; na++ 133;ca 9.1 mag 1.9 free t4: 1.37 free t3: 2.1 tsh 7.150 01-08-20: ANA neg; CRP 15.6 01-09-20: wbc 8.2; hgb 8.2; hct 25.9; mcv 66.6 plt 238;  01-10-20: wbc 10.2; hgb 8.9; hct 28.2; mcv 67.6 plt 290; gucose 117; bun 25; creat 1.59; k+ 4.2; na++ 138; ca 9.2; live normal albumin 3.4 mag 1.9 RPR neg 02-01-20: wbc 10.6; hgb 28.1; mcv 74.1 plt 271; glucose 140 bun 27; creat 1.28; k+ 4.4; an++ 120; ca 8.9 liver normal albumin 3.4 mag 1.9 02-04-20: wbc 4.7; hgb 9.2; hct 28.7; mcv 78.2 plt 273 02-05-20: wbc 5.0; hgb 9.4; hct 29.2; mcv 78.3 plt 270; glucose 95; bun 19; creat 0.90; k+ 3.5;  na++ 135; ca 8.6  liver normal albumin 2.6 02-08-20; glucose 85; bun 13; creat 0.91; k+ 3.6; na++ 134; ca 8.6 02-14-20: tsh 6.564 free T3: 2.2 free T4: 1.80 02-16-20: wbc 5.9; hgb 9.6; hct 30.3; mcv 81.9 plt 312; glucose 86; bun 15; creat 1.07; k+ 3.5; na++ 137; ca 8.4  03-01-20: wbc 7.3; hgb 10.1; hct 31.8; mcv 86.6 plt 195; glucose 100; bun 16; creat 1.08 ;k+ 3.5; na++ 137; ca 8.8  03-06-20; wbc 8.4; hgb 11.0; hct 35.7; mcv 87.1 plt 205; glucose 112; bun 17; creat 1.18; k+ 3.8; na++ 134; ca 9.2 liver normal albumin 3.5; ferritin 966; iron 24; tibc 158 vit B 12: 1294; vit D 41.02 03-20-20; wbc 10.9; hgb 10.5; hct 34.3; mcv 89.8 plt 293; glucose 114; bun 18; creat 1.11; k+ 4.4; na++ 139; ca 9.5 liver normal albumin 3.4 vit B 12: 1376; vit D 45.43  04-03-20: wbc 8.7; hgb 10.5; hct 34.3; mcv 91.7 plt 324  NO NEW LABS.   Review of Systems  Constitutional: Negative for malaise/fatigue.  Respiratory: Negative for cough and shortness of breath.   Cardiovascular: Negative for chest pain, palpitations and leg swelling.  Gastrointestinal: Negative for abdominal pain, constipation and heartburn.  Musculoskeletal: Negative for back pain, joint pain and myalgias.  Skin: Negative.   Neurological: Negative for dizziness.  Psychiatric/Behavioral: The patient is not nervous/anxious.     Physical Exam Constitutional:      General: She is not in acute distress.    Appearance: She is underweight. She is not diaphoretic.  Neck:     Thyroid: No thyromegaly.  Cardiovascular:     Rate and Rhythm: Normal rate and regular rhythm.     Pulses: Normal pulses.     Heart sounds: Normal heart sounds.  Pulmonary:     Effort: Pulmonary effort is normal. No respiratory distress.     Breath sounds: Normal breath sounds.  Abdominal:     General: Bowel sounds are normal. There is no distension.     Palpations: Abdomen is soft.     Tenderness: There is no abdominal tenderness.  Musculoskeletal:     Cervical back: Neck supple.     Right  lower leg: No edema.     Left lower leg: No edema.     Comments: Is able to move all extremities  Has poor fine motor abilities   Has numbness in bilateral hands and lower extremities  Lymphadenopathy:     Cervical: No cervical adenopathy.  Skin:    General: Skin is warm and dry.     Comments: Right heel unstaged: 2 x 2.4 cm  Neurological:     Mental Status: She is alert and oriented to person, place, and time.  Psychiatric:        Mood and Affect: Mood normal.         ASSESSMENT/ PLAN:  TODAY  1. Hypertension associated with stage 3 chronic kidney disease due to type 2 diabetes mellitus 2. Acute deep vein thrombosis (DVT) of proximal vein of left lower extremity 3.  Other polyneuropathy   Will re-setup EMG studies and follow up with neurology Will continue current medications Will continue current plan of care  Will continue to monitor her status.   MD is aware of resident's narcotic use and is in agreement with current plan of care. We will attempt to wean resident as appropriate.  Ok Edwards NP [facility]  Contact [phone] Monday through Friday 8am- 5pm  After hours call [phone]

## 2020-05-10 ENCOUNTER — Non-Acute Institutional Stay (INDEPENDENT_AMBULATORY_CARE_PROVIDER_SITE_OTHER): Payer: Medicare Other | Admitting: Adult Health

## 2020-05-10 ENCOUNTER — Encounter: Payer: Self-pay | Admitting: Adult Health

## 2020-05-10 DIAGNOSIS — Z0000 Encounter for general adult medical examination without abnormal findings: Secondary | ICD-10-CM

## 2020-05-10 NOTE — Progress Notes (Signed)
Subjective:   Traci Parker is a 74 y.o. female who presents for Medicare Annual (Subsequent) preventive examination.  Review of Systems    Review of Systems  Constitutional: Negative for malaise/fatigue.  Respiratory: Negative for cough and shortness of breath.   Cardiovascular: Negative for chest pain, palpitations and leg swelling.  Gastrointestinal: Negative for abdominal pain, constipation and heartburn.  Musculoskeletal: Negative for back pain, joint pain and myalgias.  Skin: Negative.   Neurological: Negative for dizziness.  Psychiatric/Behavioral: The patient is not nervous/anxious.    Cardiac Risk Factors include: sedentary lifestyle;smoking/ tobacco exposure;diabetes mellitus;advanced age (>24men, >7 women)     Objective:    Today's Vitals   05/10/20 1419 05/10/20 1420  BP: 110/67   Pulse: 80   Temp: (!) 97.5 F (36.4 C)   Weight: 96 lb 10 oz (43.8 kg)   Height: 5\' 4"  (1.626 m)   PainSc:  0-No pain   Body mass index is 16.59 kg/m.  Advanced Directives 05/10/2020 05/02/2020 05/02/2020 04/17/2020 04/12/2020 04/04/2020 04/03/2020  Does Patient Have a Medical Advance Directive? Yes Yes No Yes Yes Yes No  Type of Paramedic of [place];Living will;Out of facility DNR (pink MOST or yellow form) [place];Living will;Out of facility DNR (pink MOST or yellow form) [place];Living will [place];Living will [place];Living will;Out of facility DNR (pink MOST or yellow form) [place];Living will;Out of facility DNR (pink MOST or yellow form) -  Does patient want to make changes to medical advance directive? No - Patient declined No - Patient declined No - Patient declined No - Patient declined No - Patient declined No - Patient declined No - Patient declined  Copy of [place] in Chart? Yes - validated most recent copy scanned in chart (See  row information) Yes - validated most recent copy scanned in chart (See row information) Yes - validated most recent copy scanned in chart (See row information) No - copy requested Yes - validated most recent copy scanned in chart (See row information) Yes - validated most recent copy scanned in chart (See row information) -  Would patient like information on creating a medical advance directive? - - No - Patient declined No - Patient declined - - -  Pre-existing out of facility DNR order (yellow form or pink MOST form) - - - - - - -    Current Medications (verified) Outpatient Encounter Medications as of 05/10/2020  Medication Sig  . acetaminophen (TYLENOL) 325 MG tablet Take 2 tablets (650 mg total) by mouth every 6 (six) hours as needed for mild pain (or Fever >/= 101).  Marland Kitchen amLODipine (NORVASC) 10 MG tablet Take 1 tablet (10 mg total) by mouth daily.  Marland Kitchen apixaban (ELIQUIS) 5 MG TABS tablet Take 1 tablet (5 mg total) by mouth 2 (two) times daily.  . dorzolamide (TRUSOPT) 2 % ophthalmic solution Place 1 drop into both eyes 2 (two) times daily.  . feeding supplement, ENSURE ENLIVE, (ENSURE ENLIVE) LIQD Take 237 mLs by mouth 2 (two) times daily between meals.  . gabapentin (NEURONTIN) 100 MG capsule Take 100 mg by mouth at bedtime.  Marland Kitchen albuterol (VENTOLIN HFA) 108 (90 Base) MCG/ACT inhaler Inhale 2 puffs into the lungs every 4 (four) hours as needed for wheezing or shortness of breath.  Roseanne Kaufman [place]-Castor Oil (VENELEX) OINT Apply topically 3 (three) times daily. Special Instructions: Apply to sacrum and bilateral buttocks qshift for  prevention.  . cyanocobalamin (,VITAMIN B-12,) 1000 MCG/ML injection Inject 1 mL (1,000 mcg total) into the muscle every 30 (thirty) days.  . folic acid (FOLVITE) 1 MG tablet Take 1 mg by mouth daily.   Marland Kitchen latanoprost (XALATAN) 0.005 % ophthalmic solution Place 1 drop into both eyes at bedtime.  . metoCLOPramide (REGLAN) 5 MG tablet Take 1 tablet (5 mg total) by mouth 3  (three) times daily before meals.  . NON FORMULARY Mechanical soft diet ALLERGIC TO FISH  . omeprazole (PRILOSEC) 40 MG capsule Take 40 mg by mouth daily.   . ondansetron (ZOFRAN ODT) 4 MG disintegrating tablet Take 1 tablet (4 mg total) by mouth every 8 (eight) hours as needed for nausea or vomiting. Dissolve under tongue   No facility-administered encounter medications on file as of 05/10/2020.    Allergies (verified) Latex, Levofloxacin, Neomycin-bacitracin-polymyxin  [bacitracin-neomycin-polymyxin], Other, Prednisone, Fish allergy, and Neosporin [neomycin-polymyxin-gramicidin]   History: Past Medical History:  Diagnosis Date  . Allergy   . Anemia    pernicious anemia  . Asthma   . Blood transfusion without reported diagnosis   . CKD (chronic kidney disease)   . COPD (chronic obstructive pulmonary disease) ([place]) 12/23/2019  . Gastritis   . Glaucoma   . History of blood in urine   . Hyperlipidemia   . Hypertension   . Ocular hypertension   . Osteoporosis   . Type 2 diabetes mellitus ([place]) 12/23/2019  . Vitamin B12 deficiency anemia due to intrinsic factor deficiency    Past Surgical History:  Procedure Laterality Date  . CHOLECYSTECTOMY  2008  . COLONOSCOPY  04/30/2011   Procedure: COLONOSCOPY;  Surgeon: Rogene Houston, MD;  [facility];  Service: Endoscopy;  Laterality: N/A;  . COLONOSCOPY  05/25/2012   Procedure: COLONOSCOPY;  Surgeon: Rogene Houston, MD;  [facility];  Service: Endoscopy;  Laterality: N/A;  1:25-changed to 1200 Ann to notify pt  . COLONOSCOPY Bilateral 12/2017  . ESOPHAGOGASTRODUODENOSCOPY N/A 12/07/2019   Procedure: ESOPHAGOGASTRODUODENOSCOPY (EGD);  Surgeon: Rogene Houston, MD;  [facility];  Service: Endoscopy;  Laterality: N/A;  155  . GALLBLADDER SURGERY    . right breast cystectomy  1988  . TUBAL LIGATION  1975   Family History  Problem Relation Age of Onset  . Diabetes Mother   . Hyperlipidemia Mother     . Hypertension Mother   . Heart disease Father   . Hyperlipidemia Father   . Diabetes Sister   . Hyperlipidemia Sister   . Hypertension Sister   . Cancer Brother   . Heart disease Brother   . Hyperlipidemia Brother   . Hypertension Brother   . Diabetes Sister    Social History   Socioeconomic History  . Marital status: Divorced    Spouse name: Not on file  . Number of children: 4  . Years of education: 9  . Highest education level: Not on file  Occupational History  . Occupation: retired   Tobacco Use  . Smoking status: Former Smoker    Packs/day: 1.00    Years: 40.00    Pack years: 40.00    Quit date: 04/03/2011    Years since quitting: 9.1  . Smokeless tobacco: Never Used  Vaping Use  . Vaping Use: Never used  Substance and Sexual Activity  . Alcohol use: No  . Drug use: No  . Sexual activity: Not Currently  Other Topics Concern  . Not on file  Social History  Narrative   01[birth year] lives at [facility], [place]   Social Determinants of Health   Financial Resource Strain:   . Difficulty of Paying Living Expenses: Not on file  Food Insecurity:   . Worried About Charity fundraiser in the Last Year: Not on file  . Ran Out of Food in the Last Year: Not on file  Transportation Needs:   . Lack of Transportation (Medical): Not on file  . Lack of Transportation (Non-Medical): Not on file  Physical Activity:   . Days of Exercise per Week: Not on file  . Minutes of Exercise per Session: Not on file  Stress:   . Feeling of Stress : Not on file  Social Connections:   . Frequency of Communication with Friends and Family: Not on file  . Frequency of Social Gatherings with Friends and Family: Not on file  . Attends Religious Services: Not on file  . Active Member of Clubs or Organizations: Not on file  . Attends Archivist Meetings: Not on file  . Marital Status: Not on file    Tobacco Counseling Counseling given: Not Answered   Clinical  Intake:  Pre-visit preparation completed: Yes  Pain : No/denies pain Pain Score: 0-No pain     BMI - recorded: 16.59 Nutritional Status: BMI <19  Underweight Diabetes: Yes CBG done?:  (per nursing staff) Did pt. bring in CBG monitor from home?: No  How often do you need to have someone help you when you read instructions, pamphlets, or other written materials from your doctor or pharmacy?: 2 - Rarely  Diabetic?yes  Interpreter Needed?: No      Activities of Daily Living In your present state of health, do you have any difficulty performing the following activities: 05/10/2020 02/01/2020  Hearing? N Y  Vision? N N  Difficulty concentrating or making decisions? N N  Walking or climbing stairs? Y Y  Dressing or bathing? Y Y  Doing errands, shopping? [place]  Preparing Food and eating ? Y -  Using the Toilet? Y -  In the past six months, have you accidently leaked urine? Y -  Do you have problems with loss of bowel control? Y -  Managing your Medications? Y -  Managing your Finances? Y -  Some recent data might be hidden    Patient Care Team: Gerlene Fee, NP as PCP - General (Geriatric Medicine) Derek Jack, MD as Consulting Physician (Hematology) [facility] [place] [facility]  Indicate any recent Medical Services you may have received from other than [facility] providers in the past year (date may be approximate).     Assessment:   This is a routine wellness examination for [place].  Hearing/Vision screen No exam data present  Dietary issues and exercise activities discussed: Current Exercise Habits: The patient does not participate in regular exercise at present, Exercise limited by: neurologic condition(s)  Goals    . Follow up with Primary Care Provider    . General - Client will not be readmitted within 30 days ([facility])      Depression Screen PHQ 2/9 Scores 05/10/2020 12/18/2019 10/30/2019 09/20/2019  PHQ - 2 Score 0 0 0 0  Exception  Documentation - Medical reason - -    Fall Risk Fall Risk  05/10/2020 01/17/2020 12/18/2019 10/30/2019 09/20/2019  Falls in the past year? 1 1 1  0 0  Number falls in past yr: 0 1 1 0 0  Injury with Fall? 0 0 1 0  0  Risk for fall due to : History of fall(s);Impaired balance/gait;Impaired mobility - Impaired balance/gait;Impaired mobility - -  Follow up - - Falls evaluation completed Falls evaluation completed Falls evaluation completed    Any stairs in or around the home? no If so, are there any without handrails?  Home free of loose throw rugs in walkways, pet beds, electrical cords, etc? yes Adequate lighting in your home to reduce risk of falls? Yes  ASSISTIVE DEVICES UTILIZED TO PREVENT FALLS:  Life alert? no Use of a cane, walker or w/c? yes Grab bars in the bathroom? yes Shower chair or bench in shower? yes Elevated toilet seat or a handicapped toilet? Yes  TIMED UP AND GO:  Was the test performed? no Nonambulatory   Cognitive Function:        Immunizations Immunization History  Administered Date(s) Administered  . Influenza, High Dose Seasonal PF 04/17/2019  . Influenza-Unspecified 04/01/2012, 04/05/2018, 04/19/2020  . PFIZER SARS-COV-2 Vaccination 09/25/2019, 10/16/2019  . Pneumococcal Polysaccharide-23 09/24/2017  . Pneumococcal-Unspecified 12/02/2008     Qualifies for Shingles Vaccine? Yes  Screening Tests Health Maintenance  Topic Date Due  . FOOT EXAM  Never done  . OPHTHALMOLOGY EXAM  Never done  . PNA vac Low Risk Adult (2 of 2 - PCV13) 09/25/2018  . URINE MICROALBUMIN  03/22/2020  . TETANUS/TDAP  10/03/2020 (Originally 06/29/1965)  . HEMOGLOBIN A1C  07/19/2020  . COLONOSCOPY  03/13/2028  . INFLUENZA VACCINE  Completed  . DEXA SCAN  Completed  . COVID-19 Vaccine  Completed  . Hepatitis C Screening  Completed    Health Maintenance  Health Maintenance Due  Topic Date Due  . FOOT EXAM  Never done  . OPHTHALMOLOGY EXAM  Never done  . PNA vac Low  Risk Adult (2 of 2 - PCV13) 09/25/2018  . URINE MICROALBUMIN  03/22/2020      Lung Cancer Screening: (Low Dose CT Chest recommended if Age 67-80 years, 30 pack-year currently smoking OR have quit w/in 15years.) does not qualify.   Lung Cancer Screening Referral: n/a Additional Screening:  Hepatitis C Screening:   Vision Screening: Recommended annual ophthalmology exams for early detection of glaucoma and other disorders of the eye. Is the patient up to date with their annual eye exam? yes Who is the provider or what is the name of the office in which the patient attends annual eye exams? At facility  If pt is not established with a provider, would they like to be referred to a provider to establish care?   Dental Screening: Recommended annual dental exams for proper oral hygiene  Community Resource Referral / Chronic Care Management: CRR required this visit? no CCM required this visit? yes     Plan:     I have personally reviewed and noted the following in the patient's chart:   . Medical and social history . Use of alcohol, tobacco or illicit drugs  . Current medications and supplements . Functional ability and status . Nutritional status . Physical activity . Advanced directives . List of other physicians . Hospitalizations, surgeries, and ER visits in previous 12 months . Vitals . Screenings to include cognitive, depression, and falls . Referrals and appointments  In addition, I have reviewed and discussed with patient certain preventive protocols, quality metrics, and best practice recommendations. A written personalized care plan for preventive services as well as general preventive health recommendations were provided to patient.     Gerlene Fee, NP   05/10/2020

## 2020-05-10 NOTE — Patient Instructions (Signed)
°  Traci Parker , Thank you for taking time to come for your Medicare Wellness Visit. I appreciate your ongoing commitment to your health goals. Please review the following plan we discussed and let me know if I can assist you in the future.   These are the goals we discussed: Goals     Follow up with Primary Care Provider     General - Client will not be readmitted within 30 days ([facility])       This is a list of the screening recommended for you and due dates:  Health Maintenance  Topic Date Due   Complete foot exam   Never done   Eye exam for diabetics  Never done   Pneumonia vaccines (2 of 2 - PCV13) 09/25/2018   Urine Protein Check  03/22/2020   Tetanus Vaccine  10/03/2020*   Hemoglobin A1C  07/19/2020   Colon Cancer Screening  03/13/2028   Flu Shot  Completed   DEXA scan (bone density measurement)  Completed   COVID-19 Vaccine  Completed    Hepatitis C: One time screening is recommended by Center for Disease Control  (CDC) for  adults born from [birth year] through 1967.   Completed  *Topic was postponed. The date shown is not the original due date.

## 2020-05-13 ENCOUNTER — Non-Acute Institutional Stay (SKILLED_NURSING_FACILITY): Payer: Medicare Other | Admitting: Adult Health

## 2020-05-13 ENCOUNTER — Encounter: Payer: Self-pay | Admitting: Adult Health

## 2020-05-13 DIAGNOSIS — I700 Atherosclerosis of aorta: Secondary | ICD-10-CM

## 2020-05-13 DIAGNOSIS — E538 Deficiency of other specified B group vitamins: Secondary | ICD-10-CM | POA: Diagnosis not present

## 2020-05-13 DIAGNOSIS — R627 Adult failure to thrive: Secondary | ICD-10-CM | POA: Diagnosis not present

## 2020-05-13 NOTE — Progress Notes (Signed)
Location:    [facility] Room Number: 150/P Place of Service:  SNF (31)   CODE STATUS: DNR  Allergies  Allergen Reactions   Latex Rash   Levofloxacin Nausea And Vomiting   Neomycin-Bacitracin-Polymyxin  [Bacitracin-Neomycin-Polymyxin] Rash   Other Rash and Swelling   Prednisone Hives, Itching, Rash and Other (See Comments)   Fish Allergy     rash   Neosporin [Neomycin-Polymyxin-Gramicidin] Itching and Rash    Chief Complaint  Patient presents with   Medical Management of Chronic Issues         Vitamin B 12 deficiency  Failure to thrive in adult  . Aortic atherosclerosis    HPI:  She is a 74 year old long term resident of this facility being seen for the management of her chronic illnesses: Vitamin B 12 deficiency  Failure to thrive in adult  . Aortic atherosclerosis. There are no reports of uncontrolled pain; no reports of changes in appetite; weight is stable. No reports of insomnia.   Past Medical History:  Diagnosis Date   Allergy    Anemia    pernicious anemia   Asthma    Blood transfusion without reported diagnosis    CKD (chronic kidney disease)    COPD (chronic obstructive pulmonary disease) ([place]) 12/23/2019   Gastritis    Glaucoma    History of blood in urine    Hyperlipidemia    Hypertension    Ocular hypertension    Osteoporosis    Type 2 diabetes mellitus ([place]) 12/23/2019   Vitamin B12 deficiency anemia due to intrinsic factor deficiency     Past Surgical History:  Procedure Laterality Date   CHOLECYSTECTOMY  2008   COLONOSCOPY  04/30/2011   Procedure: COLONOSCOPY;  Surgeon: Rogene Houston, MD;  [facility];  Service: Endoscopy;  Laterality: N/A;   COLONOSCOPY  05/25/2012   Procedure: COLONOSCOPY;  Surgeon: Rogene Houston, MD;  [facility];  Service: Endoscopy;  Laterality: N/A;  1:25-changed to 1200 Ann to notify pt   COLONOSCOPY Bilateral 12/2017    ESOPHAGOGASTRODUODENOSCOPY N/A 12/07/2019   Procedure: ESOPHAGOGASTRODUODENOSCOPY (EGD);  Surgeon: Rogene Houston, MD;  [facility];  Service: Endoscopy;  Laterality: N/A;  155   GALLBLADDER SURGERY     right breast cystectomy  1988   TUBAL LIGATION  1975    Social History   Socioeconomic History   Marital status: Divorced    Spouse name: Not on file   Number of children: 4   Years of education: 9   Highest education level: Not on file  Occupational History   Occupation: retired   Tobacco Use   Smoking status: Former Smoker    Packs/day: 1.00    Years: 40.00    Pack years: 40.00    Quit date: 04/03/2011    Years since quitting: 9.1   Smokeless tobacco: Never Used  Vaping Use   Vaping Use: Never used  Substance and Sexual Activity   Alcohol use: No   Drug use: No   Sexual activity: Not Currently  Other Topics Concern   Not on file  Social History Narrative   01/17/20 lives at [facility], [employer] [place]   Social Determinants of Health   Financial Resource Strain:    Difficulty of Paying Living Expenses: Not on file  Food Insecurity:    Worried About [place] in the Last Year: Not on file   [place] in the Last  Year: Not on file  Transportation Needs:    Lack of Transportation (Medical): Not on file   Lack of Transportation (Non-Medical): Not on file  Physical Activity:    Days of Exercise per Week: Not on file   Minutes of Exercise per Session: Not on file  Stress:    Feeling of Stress : Not on file  Social Connections:    Frequency of Communication with Friends and Family: Not on file   Frequency of Social Gatherings with Friends and Family: Not on file   Attends Religious Services: Not on file   Active Member of Clubs or Organizations: Not on file   Attends Archivist Meetings: Not on file   Marital Status: Not on file  Intimate Partner Violence:    Fear of Current or Ex-Partner: Not on file     Emotionally Abused: Not on file   Physically Abused: Not on file   Sexually Abused: Not on file   Family History  Problem Relation Age of Onset   Diabetes Mother    Hyperlipidemia Mother    Hypertension Mother    Heart disease Father    Hyperlipidemia Father    Diabetes Sister    Hyperlipidemia Sister    Hypertension Sister    Cancer Brother    Heart disease Brother    Hyperlipidemia Brother    Hypertension Brother    Diabetes Sister       VITAL SIGNS BP 130/70    Pulse 72    Temp 98.6 F (37 C)    Resp 20    Wt 96 lb 9.6 oz (43.8 kg)    BMI 16.58 kg/m   Outpatient Encounter Medications as of 05/13/2020  Medication Sig   acetaminophen (TYLENOL) 325 MG tablet Take 2 tablets (650 mg total) by mouth every 6 (six) hours as needed for mild pain (or Fever >/= 101).   albuterol (VENTOLIN HFA) 108 (90 Base) MCG/ACT inhaler Inhale 2 puffs into the lungs every 4 (four) hours as needed for wheezing or shortness of breath.   amLODipine (NORVASC) 10 MG tablet Take 1 tablet (10 mg total) by mouth daily.   apixaban (ELIQUIS) 5 MG TABS tablet Take 1 tablet (5 mg total) by mouth 2 (two) times daily.   Balsam [place]-Castor Oil (VENELEX) OINT Apply topically 3 (three) times daily. Special Instructions: Apply to sacrum and bilateral buttocks qshift for prevention.   cyanocobalamin (,VITAMIN B-12,) 1000 MCG/ML injection Inject 1 mL (1,000 mcg total) into the muscle every 30 (thirty) days.   dorzolamide (TRUSOPT) 2 % ophthalmic solution Place 1 drop into both eyes 2 (two) times daily.   feeding supplement, ENSURE ENLIVE, (ENSURE ENLIVE) LIQD Take 237 mLs by mouth 2 (two) times daily between meals.   folic acid (FOLVITE) 1 MG tablet Take 1 mg by mouth daily.    gabapentin (NEURONTIN) 100 MG capsule Take 100 mg by mouth at bedtime.   latanoprost (XALATAN) 0.005 % ophthalmic solution Place 1 drop into both eyes at bedtime.   metoCLOPramide (REGLAN) 5 MG tablet Take 1  tablet (5 mg total) by mouth 3 (three) times daily before meals.   NON FORMULARY Mechanical soft diet ALLERGIC TO FISH   omeprazole (PRILOSEC) 40 MG capsule Take 40 mg by mouth daily.    ondansetron (ZOFRAN ODT) 4 MG disintegrating tablet Take 1 tablet (4 mg total) by mouth every 8 (eight) hours as needed for nausea or vomiting. Dissolve under tongue   No facility-administered encounter medications on file  as of 05/13/2020.     SIGNIFICANT DIAGNOSTIC EXAMS   PREVIOUS   12-23-19: chest x-ray: No active disease.   12-23-19: ct angio of chest:  1. No evidence of pulmonary embolism. 2. Paraseptal emphysema, mild and worse at the lung apices. 3. 3 mm nodule along the fissure in the RIGHT lower lobe. . 4. Moderate and with 3.6 cm caliber of ascending thoracic aorta.. Aortic aneurysm  5. Emphysema and aortic atherosclerosis  02-02-20: chest x-ray: COPD changes without acute infiltrate   02-02-20: bilateral lower extremity venous ultrasound:  Extensive acute appearing left femoropopliteal occlusive DVT extending into the calf veins. Negative for right lower extremity DVT  02-05-20: MRI cervical spine:  1. Widespread cervical spine degeneration. No acute osseous abnormality. 2. Multilevel mild to moderate spinal stenosis and spinal cord mass effect at C3-C4 through C6-C7, with possible spinal cord myelomalacia at the C6 level. No other cord signal abnormality. 3. Moderate or severe degenerative neural foraminal stenosis at the bilateral C5 and C6 nerve levels.   02-05-20: MRI thoracic spine:  Normal for age MRI appearance of the thoracic spine   02-06-20: ct of chest abdomen and pelvis:  1. No definite evidence of malignancy in the chest, abdomen, or pelvis. 2. Unchanged small bilateral lung nodules measuring up to 3 mm. Non-contrast chest CT can be considered in 12 months.  3. Large stool burden with rectal distension and presacral inflammation which may reflect stercoral proctitis. 4.  Known left lower extremity DVT. 5. Nonobstructing nephrolithiasis. 6. Aortic Atherosclerosis and Emphysema .  NO NEW EXAMS.     LABS REVIEWED PREVIOUS   12-23-19: wbc 8.2; hgb 7.3; hct 25.2; mcv 58.3 plt 220; glucose 114; bun 15; creat 1.46; k+ 3.2; na++ 137 ca 9.4 liver normal albumin 4.1 d-dimer 0.86; mag 1.5; phos 3.3; urine culture: e-coli 12-25-19: wbc 8.2; hgb 7.1; hct 23.5; mcv 57.6 plt 215; glucose 111; bun 8; creat 1.08; k+ 4.2; na++ 135 ca 8.8; mag 1.6 12-26-19: wbc 7.9; hgb 9.5; hct 30.0 mcv 63.6 plt 205; glucose 110; bun 13; creat 1.21; k+ 4.1; na++ 136; ca 9.2 phos 3.4 albumin 3.6  01-01-20: wbc 10.8; hgb 9.9; hct 32.5; mcv 65.4 plt 282; glucose 99; bun 21; creat 1.41; k+ 3.9; na++ 133;ca 9.1 mag 1.9 free t4: 1.37 free t3: 2.1 tsh 7.150 01-08-20: ANA neg; CRP 15.6 01-09-20: wbc 8.2; hgb 8.2; hct 25.9; mcv 66.6 plt 238;  01-10-20: wbc 10.2; hgb 8.9; hct 28.2; mcv 67.6 plt 290; gucose 117; bun 25; creat 1.59; k+ 4.2; na++ 138; ca 9.2; live normal albumin 3.4 mag 1.9 RPR neg 02-01-20: wbc 10.6; hgb 28.1; mcv 74.1 plt 271; glucose 140 bun 27; creat 1.28; k+ 4.4; an++ 120; ca 8.9 liver normal albumin 3.4 mag 1.9 02-04-20: wbc 4.7; hgb 9.2; hct 28.7; mcv 78.2 plt 273 02-05-20: wbc 5.0; hgb 9.4; hct 29.2; mcv 78.3 plt 270; glucose 95; bun 19; creat 0.90; k+ 3.5; na++ 135; ca 8.6 liver normal albumin 2.6 02-08-20; glucose 85; bun 13; creat 0.91; k+ 3.6; na++ 134; ca 8.6 02-14-20: tsh 6.564 free T3: 2.2 free T4: 1.80 02-16-20: wbc 5.9; hgb 9.6; hct 30.3; mcv 81.9 plt 312; glucose 86; bun 15; creat 1.07; k+ 3.5; na++ 137; ca 8.4  03-01-20: wbc 7.3; hgb 10.1; hct 31.8; mcv 86.6 plt 195; glucose 100; bun 16; creat 1.08 ;k+ 3.5; na++ 137; ca 8.8  03-06-20; wbc 8.4; hgb 11.0; hct 35.7; mcv 87.1 plt 205; glucose 112; bun 17; creat 1.18; k+ 3.8;  na++ 134; ca 9.2 liver normal albumin 3.5; ferritin 966; iron 24; tibc 158 vit B 12: 1294; vit D 41.02 03-20-20; wbc 10.9; hgb 10.5; hct 34.3; mcv 89.8 plt 293; glucose  114; bun 18; creat 1.11; k+ 4.4; na++ 139; ca 9.5 liver normal albumin 3.4 vit B 12: 1376; vit D 45.43  04-03-20: wbc 8.7; hgb 10.5; hct 34.3; mcv 91.7 plt 324  TODAY  04-17-20: wbc 7.6; hgb 10.4; hct 32.9; mcv 92.2 plt 270 05-02-20: wbc 7.2; hgb 10.3; hct 33.0; mcv 94.3 plt 243    Review of Systems  Constitutional: Negative for malaise/fatigue.  Respiratory: Negative for cough and shortness of breath.   Cardiovascular: Negative for chest pain, palpitations and leg swelling.  Gastrointestinal: Negative for abdominal pain, constipation and heartburn.  Musculoskeletal: Negative for back pain, joint pain and myalgias.  Skin: Negative.   Neurological: Negative for dizziness.  Psychiatric/Behavioral: The patient is not nervous/anxious.     Physical Exam Constitutional:      General: She is not in acute distress.    Appearance: She is well-developed. She is not diaphoretic.  Neck:     Thyroid: No thyromegaly.  Cardiovascular:     Rate and Rhythm: Normal rate and regular rhythm.     Pulses: Normal pulses.     Heart sounds: Normal heart sounds.  Pulmonary:     Effort: Pulmonary effort is normal. No respiratory distress.     Breath sounds: Normal breath sounds.  Abdominal:     General: Bowel sounds are normal. There is no distension.     Palpations: Abdomen is soft.     Tenderness: There is no abdominal tenderness.  Musculoskeletal:     Cervical back: Neck supple.     Right lower leg: No edema.     Left lower leg: No edema.     Comments:  Is able to move all extremities  Has poor fine motor abilities   Lymphadenopathy:     Cervical: No cervical adenopathy.  Skin:    General: Skin is warm and dry.     Comments: Right heel unstaged: 2 x 2.4 cm   Neurological:     Mental Status: She is alert and oriented to person, place, and time.  Psychiatric:        Mood and Affect: Mood normal.         ASSESSMENT/ PLAN:  TODAY  1. Vitamin B 12 deficiency is stable will continue  monthly b 12 injections and folic acid 1 mg daily   2. Failure to thrive in adult is without change is off megace due to dvt. Completed remeron therapy; weight is stable at 96 pounds.   3. Aortic atherosclerosis is stable will monitor    PREVIOUS   4. Acute deep vein thrombosis (DVT) of proximal vein of left lower extremity: diagnosed 02-02-20 will continue eliquis 5 mg twice daily   5. Chronic obstructive pulmonary disease unspecified COPD type: is stable will continue albuterol 2 puffs every 4 hours as needed  6. Peripheral neuropathy: is stable will continue gabapentin 100 mg nightly   7.  CKD stage 3 due to diabetes mellitus type 2: is stable bun 18; creat 1.11  8. Type 2 diabetes mellitus with stage 3 chronic kidney disease without long term current use  of insulin: hgb a1c 5.2 will monitor   9. Other chronic gastritis without hemorrhage:is stable will continue prilosec 40 mg daily reglan 5 mg three times daily   10. Hypertension associated with stage 3  chronic kidney disease due to type 2 diabetes mellitus: is stable b/p 130/70 will continue norvasc 10 mg daily   11. Anemia due to chronic renal failure treated erythropoietin stage 3: is stable hgb 10.3 will continue to monitor is treated per oncology   12. Increased intraocular pressure bilateral is stable will continue trusopt to both eyes twice daily and xalatan to eyes nightly    Will check lipids; hgb a1c; dexa scan; screening mammogram; urine microalbumin           MD is aware of resident's narcotic use and is in agreement with current plan of care. We will attempt to wean resident as appropriate.  Ok Edwards NP [facility]  Contact [phone] Monday through Friday 8am- 5pm  After hours call [phone]

## 2020-05-16 ENCOUNTER — Inpatient Hospital Stay (HOSPITAL_COMMUNITY): Payer: Medicare Other

## 2020-05-16 ENCOUNTER — Inpatient Hospital Stay (HOSPITAL_COMMUNITY): Payer: Medicare Other | Attending: Hematology

## 2020-05-16 DIAGNOSIS — D631 Anemia in chronic kidney disease: Secondary | ICD-10-CM

## 2020-05-16 DIAGNOSIS — N183 Chronic kidney disease, stage 3 unspecified: Secondary | ICD-10-CM | POA: Insufficient documentation

## 2020-05-16 DIAGNOSIS — E538 Deficiency of other specified B group vitamins: Secondary | ICD-10-CM | POA: Insufficient documentation

## 2020-05-16 LAB — CBC
HCT: 35.5 % — ABNORMAL LOW (ref 36.0–46.0)
Hemoglobin: 11.3 g/dL — ABNORMAL LOW (ref 12.0–15.0)
MCH: 29.6 pg (ref 26.0–34.0)
MCHC: 31.8 g/dL (ref 30.0–36.0)
MCV: 92.9 fL (ref 80.0–100.0)
Platelets: 255 10*3/uL (ref 150–400)
RBC: 3.82 MIL/uL — ABNORMAL LOW (ref 3.87–5.11)
RDW: 14.2 % (ref 11.5–15.5)
WBC: 7.1 10*3/uL (ref 4.0–10.5)
nRBC: 0 % (ref 0.0–0.2)

## 2020-05-16 NOTE — Progress Notes (Signed)
Hgb 11.3 today Retacrit injection held per parameters. Reviewed this information with the pt who verbalized understanding. Pt was discharged via wheelchair in satisfactory condition accompanied by [facility] employee

## 2020-05-20 ENCOUNTER — Encounter (HOSPITAL_COMMUNITY)
Admission: RE | Admit: 2020-05-20 | Discharge: 2020-05-20 | Disposition: A | Discharge status: Home / Self Care | Payer: Medicare Other | Source: Ambulatory Visit | Attending: Nephrology | Admitting: Nephrology

## 2020-05-20 DIAGNOSIS — E1129 Type 2 diabetes mellitus with other diabetic kidney complication: Secondary | ICD-10-CM | POA: Insufficient documentation

## 2020-05-20 LAB — LIPID PANEL
Cholesterol: 197 mg/dL (ref 0–200)
HDL: 53 mg/dL (ref >40)
LDL Cholesterol: 130 mg/dL — ABNORMAL HIGH (ref 0–99)
Total CHOL/HDL Ratio: 3.7 RATIO
Triglycerides: 70 mg/dL (ref <150)
VLDL: 14 mg/dL (ref 0–40)

## 2020-05-20 LAB — HEMOGLOBIN A1C
Hgb A1c MFr Bld: 4.8 % (ref 4.8–5.6)
Mean Plasma Glucose: 91.06 mg/dL

## 2020-05-21 LAB — MICROALBUMIN, URINE: Microalb, Ur: 15.8 ug/mL — ABNORMAL HIGH

## 2020-05-23 DIAGNOSIS — M810 Age-related osteoporosis without current pathological fracture: Secondary | ICD-10-CM | POA: Diagnosis not present

## 2020-05-23 DIAGNOSIS — R937 Abnormal findings on diagnostic imaging of other parts of musculoskeletal system: Secondary | ICD-10-CM | POA: Diagnosis not present

## 2020-05-24 DIAGNOSIS — L603 Nail dystrophy: Secondary | ICD-10-CM | POA: Diagnosis not present

## 2020-05-24 DIAGNOSIS — B351 Tinea unguium: Secondary | ICD-10-CM | POA: Diagnosis not present

## 2020-05-24 DIAGNOSIS — I739 Peripheral vascular disease, unspecified: Secondary | ICD-10-CM | POA: Diagnosis not present

## 2020-05-24 DIAGNOSIS — R262 Difficulty in walking, not elsewhere classified: Secondary | ICD-10-CM | POA: Diagnosis not present

## 2020-05-24 DIAGNOSIS — M2041 Other hammer toe(s) (acquired), right foot: Secondary | ICD-10-CM | POA: Diagnosis not present

## 2020-05-24 DIAGNOSIS — M2042 Other hammer toe(s) (acquired), left foot: Secondary | ICD-10-CM | POA: Diagnosis not present

## 2020-05-30 ENCOUNTER — Encounter (HOSPITAL_COMMUNITY): Payer: Self-pay

## 2020-05-30 ENCOUNTER — Inpatient Hospital Stay (HOSPITAL_COMMUNITY): Payer: Medicare Other

## 2020-05-30 ENCOUNTER — Other Ambulatory Visit: Payer: Self-pay

## 2020-05-30 VITALS — BP 125/83 | HR 94 | Temp 97.1°F | Resp 17

## 2020-05-30 DIAGNOSIS — D509 Iron deficiency anemia, unspecified: Secondary | ICD-10-CM

## 2020-05-30 DIAGNOSIS — E860 Dehydration: Secondary | ICD-10-CM

## 2020-05-30 DIAGNOSIS — D631 Anemia in chronic kidney disease: Secondary | ICD-10-CM | POA: Diagnosis not present

## 2020-05-30 DIAGNOSIS — E538 Deficiency of other specified B group vitamins: Secondary | ICD-10-CM | POA: Diagnosis not present

## 2020-05-30 DIAGNOSIS — N183 Chronic kidney disease, stage 3 unspecified: Secondary | ICD-10-CM | POA: Diagnosis not present

## 2020-05-30 LAB — CBC
HCT: 34.3 % — ABNORMAL LOW (ref 36.0–46.0)
Hemoglobin: 10.8 g/dL — ABNORMAL LOW (ref 12.0–15.0)
MCH: 29.1 pg (ref 26.0–34.0)
MCHC: 31.5 g/dL (ref 30.0–36.0)
MCV: 92.5 fL (ref 80.0–100.0)
Platelets: 248 10*3/uL (ref 150–400)
RBC: 3.71 MIL/uL — ABNORMAL LOW (ref 3.87–5.11)
RDW: 14.2 % (ref 11.5–15.5)
WBC: 7.5 10*3/uL (ref 4.0–10.5)
nRBC: 0 % (ref 0.0–0.2)

## 2020-05-30 MED ORDER — EPOETIN ALFA-EPBX 10000 UNIT/ML IJ SOLN
10000.0000 [IU] | Freq: Once | INTRAMUSCULAR | Status: AC
  Administered 2020-05-30: 10000 [IU] via SUBCUTANEOUS
  Filled 2020-05-30: qty 1

## 2020-05-30 NOTE — Progress Notes (Signed)
Traci Parker presents today for injection per the provider's orders.  retacrit 10,000 units administration without incident; injection site WNL; see MAR for injection details.  Patient tolerated procedure well and without incident.  No questions or complaints noted at this time.  Hemoglobin 10.8.  Discharged in stable condition via wheelchair.

## 2020-05-30 NOTE — Patient Instructions (Signed)
[facility] Discharge Instructions  Received Retacrit injection today. Follow-up as scheduled   Thank you for choosing [facility] at [facility] to provide your oncology and hematology care.  To afford each patient quality time with our provider, please arrive at least 15 minutes before your scheduled appointment time.   If you have a lab appointment with the Cancer Center please come in thru the Main Entrance and check in at the main information desk.  You need to re-schedule your appointment should you arrive 10 or more minutes late.  We strive to give you quality time with our providers, and arriving late affects you and other patients whose appointments are after yours.  Also, if you no show three or more times for appointments you may be dismissed from the clinic at the providers discretion.     Again, thank you for choosing [facility].  Our hope is that these requests will decrease the amount of time that you wait before being seen by our physicians.       _____________________________________________________________  Should you have questions after your visit to [facility], please contact our office at [phone] and follow the prompts.  Our office hours are 8:00 a.m. and 4:30 p.m. Monday - Friday.  Please note that voicemails left after 4:00 p.m. may not be returned until the following business day.  We are closed weekends and major holidays.  You do have access to a nurse 24-7, just call the main number to the clinic [phone] and do not press any options, hold on the line and a nurse will answer the phone.    For prescription refill requests, have your pharmacy contact our office and allow 72 hours.    Due to Covid, you will need to wear a mask upon entering the hospital. If you do not have a mask, a mask will be given to you at the Main Entrance upon arrival. For doctor visits, patients may  have 1 support person age 18 or older with them. For treatment visits, patients can not have anyone with them due to social distancing guidelines and our immunocompromised population.     

## 2020-05-31 ENCOUNTER — Encounter: Payer: Self-pay | Admitting: Adult Health

## 2020-06-05 ENCOUNTER — Non-Acute Institutional Stay (SKILLED_NURSING_FACILITY): Payer: Medicare Other | Admitting: Adult Health

## 2020-06-05 ENCOUNTER — Encounter: Payer: Self-pay | Admitting: Adult Health

## 2020-06-05 DIAGNOSIS — J449 Chronic obstructive pulmonary disease, unspecified: Secondary | ICD-10-CM | POA: Diagnosis not present

## 2020-06-05 DIAGNOSIS — E1122 Type 2 diabetes mellitus with diabetic chronic kidney disease: Secondary | ICD-10-CM | POA: Diagnosis not present

## 2020-06-05 DIAGNOSIS — I129 Hypertensive chronic kidney disease with stage 1 through stage 4 chronic kidney disease, or unspecified chronic kidney disease: Secondary | ICD-10-CM

## 2020-06-05 DIAGNOSIS — I700 Atherosclerosis of aorta: Secondary | ICD-10-CM

## 2020-06-05 DIAGNOSIS — N183 Chronic kidney disease, stage 3 unspecified: Secondary | ICD-10-CM | POA: Diagnosis not present

## 2020-06-05 NOTE — Progress Notes (Signed)
Location:    [facility] Room Number: 150/P Place of Service:  SNF (31)   CODE STATUS: DNR  Allergies  Allergen Reactions   Latex Rash   Levofloxacin Nausea And Vomiting   Neomycin-Bacitracin-Polymyxin  [Bacitracin-Neomycin-Polymyxin] Rash   Other Rash and Swelling   Prednisone Hives, Itching, Rash and Other (See Comments)   Fish Allergy     rash   Neosporin [Neomycin-Polymyxin-Gramicidin] Itching and Rash    Chief Complaint  Patient presents with   Acute Visit    Care Plan Meeting    HPI:  [name] BIMS 15/15 mood 6/30. She is nonambulatory. She remains extensive assist with adls; is incontinent of bladder and bowel. She is able to feed herself. Her family states that she needs help with eating as it is difficult for her to feed herself. She has had a fall on 02-18-20 without injury. She has a right heel ulceration the eschar is off now with slough present. Her family is wanting a neurological work up performed. Her appointments have been canceled in the past along with EMG. They have requested that these appointments be setup one time again. Her family would like to take her home long term. She continues to be followed for her chronic illnesses including: Chronic obstructive pulmonary disease unspecified COPD type   Aortic atherosclerosis  Hypertension associated with stage 3 chronic kidney disease due to type 2 diabetes mellitus:   Past Medical History:  Diagnosis Date   Allergy    Anemia    pernicious anemia   Asthma    Blood transfusion without reported diagnosis    CKD (chronic kidney disease)    COPD (chronic obstructive pulmonary disease) ([facility]) 12/23/2019   Gastritis    Glaucoma    History of blood in urine    Hyperlipidemia    Hypertension    Ocular hypertension    Osteoporosis    Type 2 diabetes mellitus ([place]) 12/23/2019   Vitamin B12 deficiency anemia due to  intrinsic factor deficiency     Past Surgical History:  Procedure Laterality Date   CHOLECYSTECTOMY  2008   COLONOSCOPY  04/30/2011   Procedure: COLONOSCOPY;  Surgeon: Rogene Houston, MD;  [facility]: [facility];  Service: Endoscopy;  Laterality: N/A;   COLONOSCOPY  05/25/2012   Procedure: COLONOSCOPY;  Surgeon: Rogene Houston, MD;  Location: [facility];  Service: Endoscopy;  Laterality: N/A;  1:25-changed to 1200 Ann to notify pt   COLONOSCOPY Bilateral 12/2017   ESOPHAGOGASTRODUODENOSCOPY N/A 12/07/2019   Procedure: ESOPHAGOGASTRODUODENOSCOPY (EGD);  Surgeon: Rogene Houston, MD;  Location: [facility];  Service: Endoscopy;  Laterality: N/A;  155   GALLBLADDER SURGERY     right breast cystectomy  1988   TUBAL LIGATION  1975    Social History   Socioeconomic History   Marital status: Divorced    Spouse name: Not on file   Number of children: 4   Years of education: 9   Highest education level: Not on file  Occupational History   Occupation: retired   Tobacco Use   Smoking status: Former Smoker    Packs/day: 1.00    Years: 40.00    Pack years: 40.00    Quit date: 04/03/2011    Years since quitting: 9.1   Smokeless tobacco: Never Used  Vaping Use   Vaping Use: Never used  Substance and Sexual Activity   Alcohol use: No   Drug  use: No   Sexual activity: Not Currently  Other Topics Concern   Not on file  Social History Narrative   01/17/20 lives at [facility], [place]   Social Determinants of Health   Financial Resource Strain:    Difficulty of Paying Living Expenses: Not on file  Food Insecurity:    Worried About Charity fundraiser in the Last Year: Not on file   [employer] of Food in the Last Year: Not on file  Transportation Needs:    Lack of Transportation (Medical): Not on file   Lack of Transportation (Non-Medical): Not on file  Physical Activity:    Days of Exercise per Week: Not on file   Minutes of Exercise per  Session: Not on file  Stress:    Feeling of Stress : Not on file  Social Connections:    Frequency of Communication with Friends and Family: Not on file   Frequency of Social Gatherings with Friends and Family: Not on file   Attends Religious Services: Not on file   Active Member of Clubs or Organizations: Not on file   Attends Archivist Meetings: Not on file   Marital Status: Not on file  Intimate Partner Violence:    Fear of Current or Ex-Partner: Not on file   Emotionally Abused: Not on file   Physically Abused: Not on file   Sexually Abused: Not on file   Family History  Problem Relation Age of Onset   Diabetes Mother    Hyperlipidemia Mother    Hypertension Mother    Heart disease Father    Hyperlipidemia Father    Diabetes Sister    Hyperlipidemia Sister    Hypertension Sister    Cancer Brother    Heart disease Brother    Hyperlipidemia Brother    Hypertension Brother    Diabetes Sister       VITAL SIGNS BP (!) 104/54    Pulse 78    Temp (!) 97.3 F (36.3 C)    Resp 20    Ht 5\' 4"  (1.626 m)    Wt 96 lb (43.5 kg)    SpO2 93%    BMI 16.48 kg/m   Outpatient Encounter Medications as of 06/05/2020  Medication Sig   acetaminophen (TYLENOL) 325 MG tablet Take 2 tablets (650 mg total) by mouth every 6 (six) hours as needed for mild pain (or Fever >/= 101).   albuterol (VENTOLIN HFA) 108 (90 Base) MCG/ACT inhaler Inhale 2 puffs into the lungs every 4 (four) hours as needed for wheezing or shortness of breath.   amLODipine (NORVASC) 10 MG tablet Take 1 tablet (10 mg total) by mouth daily.   apixaban (ELIQUIS) 5 MG TABS tablet Take 1 tablet (5 mg total) by mouth 2 (two) times daily.   Balsam [place]-Castor Oil (VENELEX) OINT Apply topically 3 (three) times daily. Special Instructions: Apply to sacrum and bilateral buttocks qshift for prevention.   collagenase (SANTYL) ointment Apply 1 application topically daily. Special Instructions:  Apply to wound right heel per tx orders.   cyanocobalamin (,VITAMIN B-12,) 1000 MCG/ML injection Inject 1 mL (1,000 mcg total) into the muscle every 30 (thirty) days.   dorzolamide (TRUSOPT) 2 % ophthalmic solution Place 1 drop into both eyes 2 (two) times daily.   folic acid (FOLVITE) 1 MG tablet Take 1 mg by mouth daily.    gabapentin (NEURONTIN) 100 MG capsule Take 100 mg by mouth at bedtime.   latanoprost (XALATAN) 0.005 % ophthalmic  solution Place 1 drop into both eyes at bedtime.   metoCLOPramide (REGLAN) 5 MG tablet Take 1 tablet (5 mg total) by mouth 3 (three) times daily before meals.   NON FORMULARY Mechanical soft diet ALLERGIC TO FISH   omeprazole (PRILOSEC) 40 MG capsule Take 40 mg by mouth daily.    ondansetron (ZOFRAN ODT) 4 MG disintegrating tablet Take 1 tablet (4 mg total) by mouth every 8 (eight) hours as needed for nausea or vomiting. Dissolve under tongue   [DISCONTINUED] feeding supplement, ENSURE ENLIVE, (ENSURE ENLIVE) LIQD Take 237 mLs by mouth 2 (two) times daily between meals.   No facility-administered encounter medications on file as of 06/05/2020.     SIGNIFICANT DIAGNOSTIC EXAMS   PREVIOUS   12-23-19: chest x-ray: No active disease.   12-23-19: ct angio of chest:  1. No evidence of pulmonary embolism. 2. Paraseptal emphysema, mild and worse at the lung apices. 3. 3 mm nodule along the fissure in the RIGHT lower lobe. . 4. Moderate and with 3.6 cm caliber of ascending thoracic aorta.. Aortic aneurysm  5. Emphysema and aortic atherosclerosis  02-02-20: chest x-ray: COPD changes without acute infiltrate   02-02-20: bilateral lower extremity venous ultrasound:  Extensive acute appearing left femoropopliteal occlusive DVT extending into the calf veins. Negative for right lower extremity DVT  02-05-20: MRI cervical spine:  1. Widespread cervical spine degeneration. No acute osseous abnormality. 2. Multilevel mild to moderate spinal stenosis and  spinal cord mass effect at C3-C4 through C6-C7, with possible spinal cord myelomalacia at the C6 level. No other cord signal abnormality. 3. Moderate or severe degenerative neural foraminal stenosis at the bilateral C5 and C6 nerve levels.   02-05-20: MRI thoracic spine:  Normal for age MRI appearance of the thoracic spine   02-06-20: ct of chest abdomen and pelvis:  1. No definite evidence of malignancy in the chest, abdomen, or pelvis. 2. Unchanged small bilateral lung nodules measuring up to 3 mm. Non-contrast chest CT can be considered in 12 months.  3. Large stool burden with rectal distension and presacral inflammation which may reflect stercoral proctitis. 4. Known left lower extremity DVT. 5. Nonobstructing nephrolithiasis. 6. Aortic Atherosclerosis and Emphysema .  NO NEW EXAMS.     LABS REVIEWED PREVIOUS   12-23-19: wbc 8.2; hgb 7.3; hct 25.2; mcv 58.3 plt 220; glucose 114; bun 15; creat 1.46; k+ 3.2; na++ 137 ca 9.4 liver normal albumin 4.1 d-dimer 0.86; mag 1.5; phos 3.3; urine culture: e-coli 12-25-19: wbc 8.2; hgb 7.1; hct 23.5; mcv 57.6 plt 215; glucose 111; bun 8; creat 1.08; k+ 4.2; na++ 135 ca 8.8; mag 1.6 12-26-19: wbc 7.9; hgb 9.5; hct 30.0 mcv 63.6 plt 205; glucose 110; bun 13; creat 1.21; k+ 4.1; na++ 136; ca 9.2 phos 3.4 albumin 3.6  01-01-20: wbc 10.8; hgb 9.9; hct 32.5; mcv 65.4 plt 282; glucose 99; bun 21; creat 1.41; k+ 3.9; na++ 133;ca 9.1 mag 1.9 free t4: 1.37 free t3: 2.1 tsh 7.150 01-08-20: ANA neg; CRP 15.6 01-09-20: wbc 8.2; hgb 8.2; hct 25.9; mcv 66.6 plt 238;  01-10-20: wbc 10.2; hgb 8.9; hct 28.2; mcv 67.6 plt 290; gucose 117; bun 25; creat 1.59; k+ 4.2; na++ 138; ca 9.2; live normal albumin 3.4 mag 1.9 RPR neg 02-01-20: wbc 10.6; hgb 28.1; mcv 74.1 plt 271; glucose 140 bun 27; creat 1.28; k+ 4.4; an++ 120; ca 8.9 liver normal albumin 3.4 mag 1.9 02-04-20: wbc 4.7; hgb 9.2; hct 28.7; mcv 78.2 plt 273 02-05-20: wbc 5.0;  hgb 9.4; hct 29.2; mcv 78.3 plt 270; glucose  95; bun 19; creat 0.90; k+ 3.5; na++ 135; ca 8.6 liver normal albumin 2.6 02-08-20; glucose 85; bun 13; creat 0.91; k+ 3.6; na++ 134; ca 8.6 02-14-20: tsh 6.564 free T3: 2.2 free T4: 1.80 02-16-20: wbc 5.9; hgb 9.6; hct 30.3; mcv 81.9 plt 312; glucose 86; bun 15; creat 1.07; k+ 3.5; na++ 137; ca 8.4  03-01-20: wbc 7.3; hgb 10.1; hct 31.8; mcv 86.6 plt 195; glucose 100; bun 16; creat 1.08 ;k+ 3.5; na++ 137; ca 8.8  03-06-20; wbc 8.4; hgb 11.0; hct 35.7; mcv 87.1 plt 205; glucose 112; bun 17; creat 1.18; k+ 3.8; na++ 134; ca 9.2 liver normal albumin 3.5; ferritin 966; iron 24; tibc 158 vit B 12: 1294; vit D 41.02 03-20-20; wbc 10.9; hgb 10.5; hct 34.3; mcv 89.8 plt 293; glucose 114; bun 18; creat 1.11; k+ 4.4; na++ 139; ca 9.5 liver normal albumin 3.4 vit B 12: 1376; vit D 45.43  04-03-20: wbc 8.7; hgb 10.5; hct 34.3; mcv 91.7 plt 324 04-17-20: wbc 7.6; hgb 10.4; hct 32.9; mcv 92.2 plt 270 05-02-20: wbc 7.2; hgb 10.3; hct 33.0; mcv 94.3 plt 243  NO NEW LABS.    Review of Systems  Constitutional: Negative for malaise/fatigue.  Respiratory: Negative for cough and shortness of breath.   Cardiovascular: Negative for chest pain, palpitations and leg swelling.  Gastrointestinal: Negative for abdominal pain, constipation and heartburn.  Musculoskeletal: Negative for back pain, joint pain and myalgias.  Skin: Negative.   Neurological: Negative for dizziness.  Psychiatric/Behavioral: The patient is not nervous/anxious.     Physical Exam Constitutional:      General: She is not in acute distress.    Appearance: She is well-developed. She is not diaphoretic.  Neck:     Thyroid: No thyromegaly.  Cardiovascular:     Rate and Rhythm: Normal rate and regular rhythm.     Pulses: Normal pulses.     Heart sounds: Normal heart sounds.  Pulmonary:     Effort: Pulmonary effort is normal. No respiratory distress.     Breath sounds: Normal breath sounds.  Abdominal:     General: Bowel sounds are normal. There is  no distension.     Palpations: Abdomen is soft.     Tenderness: There is no abdominal tenderness.  Musculoskeletal:     Cervical back: Neck supple.     Right lower leg: No edema.     Left lower leg: No edema.     Comments:  Is able to move all extremities  Has poor fine motor abilities    Lymphadenopathy:     Cervical: No cervical adenopathy.  Skin:    General: Skin is warm and dry.     Comments: Right heel ulceration with slough present.   Neurological:     Mental Status: She is alert and oriented to person, place, and time.  Psychiatric:        Mood and Affect: Mood normal.        ASSESSMENT/ PLAN:  TODAY;  1. Chronic obstructive pulmonary disease unspecified COPD type 2. Aortic atherosclerosis  3. Hypertension associated with stage 3 chronic kidney disease due to type 2 diabetes mellitus:   Will continue current plan of care Will continue current medications Will setup neurology follow up  Will continue to monitor her status.   MD is aware of resident's narcotic use and is in agreement with current plan of care. We will attempt to wean resident as  appropriate.  Ok Edwards NP [facility]  Contact [phone] Monday through Friday 8am- 5pm  After hours call [phone]

## 2020-06-11 DIAGNOSIS — Z1159 Encounter for screening for other viral diseases: Secondary | ICD-10-CM | POA: Diagnosis not present

## 2020-06-11 DIAGNOSIS — I82402 Acute embolism and thrombosis of unspecified deep veins of left lower extremity: Secondary | ICD-10-CM | POA: Diagnosis not present

## 2020-06-12 ENCOUNTER — Non-Acute Institutional Stay (SKILLED_NURSING_FACILITY): Payer: Medicare Other | Admitting: Adult Health

## 2020-06-12 ENCOUNTER — Encounter: Payer: Self-pay | Admitting: Adult Health

## 2020-06-12 DIAGNOSIS — G6289 Other specified polyneuropathies: Secondary | ICD-10-CM | POA: Diagnosis not present

## 2020-06-12 DIAGNOSIS — I824Y2 Acute embolism and thrombosis of unspecified deep veins of left proximal lower extremity: Secondary | ICD-10-CM | POA: Diagnosis not present

## 2020-06-12 DIAGNOSIS — J449 Chronic obstructive pulmonary disease, unspecified: Secondary | ICD-10-CM | POA: Diagnosis not present

## 2020-06-12 NOTE — Progress Notes (Signed)
Location:    [place] Room Number: 150/P Place of Service:  SNF (31)   CODE STATUS: DNR  Allergies  Allergen Reactions   Latex Rash   Levofloxacin Nausea And Vomiting   Neomycin-Bacitracin-Polymyxin  [Bacitracin-Neomycin-Polymyxin] Rash   Other Rash and Swelling   Prednisone Hives, Itching, Rash and Other (See Comments)   Fish Allergy     rash   Neosporin [Neomycin-Polymyxin-Gramicidin] Itching and Rash    Chief Complaint  Patient presents with   Medical Management of Chronic Issues          Acute deep vein thrombosis (DVT) of proximal vein of left lower extremity:  Chronic obstructive pulmonary disease unspecified COPD type: Peripheral neuropathy:    HPI:  She is a 74 year old long term resident of this facility being seen for the management of her chronic illnesses: Acute deep vein thrombosis (DVT) of proximal vein of left lower extremity:  Chronic obstructive pulmonary disease unspecified COPD type: Peripheral neuropathy. There are no reports of uncontrolled pain; appetite is stable; no cough or shortness of breath.    Past Medical History:  Diagnosis Date   Allergy    Anemia    pernicious anemia   Asthma    Blood transfusion without reported diagnosis    CKD (chronic kidney disease)    COPD (chronic obstructive pulmonary disease) ([place]) 12/23/2019   Gastritis    Glaucoma    History of blood in urine    Hyperlipidemia    Hypertension    Ocular hypertension    Osteoporosis    Type 2 diabetes mellitus ([place]) 12/23/2019   Vitamin B12 deficiency anemia due to intrinsic factor deficiency     Past Surgical History:  Procedure Laterality Date   CHOLECYSTECTOMY  2008   COLONOSCOPY  04/30/2011   Procedure: COLONOSCOPY;  Surgeon: Rogene Houston, MD;  [facility];  Service: Endoscopy;  Laterality: N/A;   COLONOSCOPY  05/25/2012   Procedure: COLONOSCOPY;  Surgeon: Rogene Houston, MD;  [facility];   Service: Endoscopy;  Laterality: N/A;  1:25-changed to 1200 Ann to notify pt   COLONOSCOPY Bilateral 12/2017   ESOPHAGOGASTRODUODENOSCOPY N/A 12/07/2019   Procedure: ESOPHAGOGASTRODUODENOSCOPY (EGD);  Surgeon: Rogene Houston, MD;  [facility];  Service: Endoscopy;  Laterality: N/A;  155   GALLBLADDER SURGERY     right breast cystectomy  1988   TUBAL LIGATION  1975    Social History   Socioeconomic History   Marital status: Divorced    Spouse name: Not on file   Number of children: 4   Years of education: 9   Highest education level: Not on file  Occupational History   Occupation: retired   Tobacco Use   Smoking status: Former Smoker    Packs/day: 1.00    Years: 40.00    Pack years: 40.00    Quit date: 04/03/2011    Years since quitting: 9.2   Smokeless tobacco: Never Used  Vaping Use   Vaping Use: Never used  Substance and Sexual Activity   Alcohol use: No   Drug use: No   Sexual activity: Not Currently  Other Topics Concern   Not on file  Social History Narrative   01/17/20 lives at [facility], [place]   Social Determinants of Health   Financial Resource Strain:    Difficulty of Paying Living Expenses: Not on file  Food Insecurity:    Worried About [place] in the  Last Year: Not on file   Ran Out of Food in the Last Year: Not on file  Transportation Needs:    Lack of Transportation (Medical): Not on file   Lack of Transportation (Non-Medical): Not on file  Physical Activity:    Days of Exercise per Week: Not on file   Minutes of Exercise per Session: Not on file  Stress:    Feeling of Stress : Not on file  Social Connections:    Frequency of Communication with Friends and Family: Not on file   Frequency of Social Gatherings with Friends and Family: Not on file   Attends Religious Services: Not on file   Active Member of Clubs or Organizations: Not on file   Attends Archivist Meetings: Not on  file   Marital Status: Not on file  Intimate Partner Violence:    Fear of Current or Ex-Partner: Not on file   Emotionally Abused: Not on file   Physically Abused: Not on file   Sexually Abused: Not on file   Family History  Problem Relation Age of Onset   Diabetes Mother    Hyperlipidemia Mother    Hypertension Mother    Heart disease Father    Hyperlipidemia Father    Diabetes Sister    Hyperlipidemia Sister    Hypertension Sister    Cancer Brother    Heart disease Brother    Hyperlipidemia Brother    Hypertension Brother    Diabetes Sister       VITAL SIGNS BP 118/62    Pulse 82    Temp 98.2 F (36.8 C)    Resp 20    Ht 5\' 4"  (1.626 m)    Wt 96 lb (43.5 kg)    BMI 16.48 kg/m   Outpatient Encounter Medications as of 06/12/2020  Medication Sig   acetaminophen (TYLENOL) 325 MG tablet Take 2 tablets (650 mg total) by mouth every 6 (six) hours as needed for mild pain (or Fever >/= 101).   albuterol (VENTOLIN HFA) 108 (90 Base) MCG/ACT inhaler Inhale 2 puffs into the lungs every 4 (four) hours as needed for wheezing or shortness of breath.   amLODipine (NORVASC) 10 MG tablet Take 1 tablet (10 mg total) by mouth daily.   apixaban (ELIQUIS) 5 MG TABS tablet Take 1 tablet (5 mg total) by mouth 2 (two) times daily.   Balsam [place]-Castor Oil (VENELEX) OINT Apply topically 3 (three) times daily. Special Instructions: Apply to sacrum and bilateral buttocks qshift for prevention.   collagenase (SANTYL) ointment Apply 1 application topically daily. Special Instructions: Apply to wound right heel per tx orders.   cyanocobalamin (,VITAMIN B-12,) 1000 MCG/ML injection Inject 1 mL (1,000 mcg total) into the muscle every 30 (thirty) days.   dorzolamide (TRUSOPT) 2 % ophthalmic solution Place 1 drop into both eyes 2 (two) times daily.   folic acid (FOLVITE) 1 MG tablet Take 1 mg by mouth daily.    gabapentin (NEURONTIN) 100 MG capsule Take 100 mg by mouth at  bedtime.   latanoprost (XALATAN) 0.005 % ophthalmic solution Place 1 drop into both eyes at bedtime.   metoCLOPramide (REGLAN) 5 MG tablet Take 1 tablet (5 mg total) by mouth 3 (three) times daily before meals.   NON FORMULARY Mechanical soft diet ALLERGIC TO FISH   omeprazole (PRILOSEC) 40 MG capsule Take 40 mg by mouth daily.    ondansetron (ZOFRAN ODT) 4 MG disintegrating tablet Take 1 tablet (4 mg total) by mouth every 8 (  eight) hours as needed for nausea or vomiting. Dissolve under tongue   No facility-administered encounter medications on file as of 06/12/2020.     SIGNIFICANT DIAGNOSTIC EXAMS   PREVIOUS   12-23-19: chest x-ray: No active disease.   12-23-19: ct angio of chest:  1. No evidence of pulmonary embolism. 2. Paraseptal emphysema, mild and worse at the lung apices. 3. 3 mm nodule along the fissure in the RIGHT lower lobe. . 4. Moderate and with 3.6 cm caliber of ascending thoracic aorta.. Aortic aneurysm  5. Emphysema and aortic atherosclerosis  02-02-20: chest x-ray: COPD changes without acute infiltrate   02-02-20: bilateral lower extremity venous ultrasound:  Extensive acute appearing left femoropopliteal occlusive DVT extending into the calf veins. Negative for right lower extremity DVT  02-05-20: MRI cervical spine:  1. Widespread cervical spine degeneration. No acute osseous abnormality. 2. Multilevel mild to moderate spinal stenosis and spinal cord mass effect at C3-C4 through C6-C7, with possible spinal cord myelomalacia at the C6 level. No other cord signal abnormality. 3. Moderate or severe degenerative neural foraminal stenosis at the bilateral C5 and C6 nerve levels.   02-05-20: MRI thoracic spine:  Normal for age MRI appearance of the thoracic spine   02-06-20: ct of chest abdomen and pelvis:  1. No definite evidence of malignancy in the chest, abdomen, or pelvis. 2. Unchanged small bilateral lung nodules measuring up to 3 mm. Non-contrast chest CT  can be considered in 12 months.  3. Large stool burden with rectal distension and presacral inflammation which may reflect stercoral proctitis. 4. Known left lower extremity DVT. 5. Nonobstructing nephrolithiasis. 6. Aortic Atherosclerosis and Emphysema .  NO NEW EXAMS.     LABS REVIEWED PREVIOUS   12-23-19: wbc 8.2; hgb 7.3; hct 25.2; mcv 58.3 plt 220; glucose 114; bun 15; creat 1.46; k+ 3.2; na++ 137 ca 9.4 liver normal albumin 4.1 d-dimer 0.86; mag 1.5; phos 3.3; urine culture: e-coli 12-25-19: wbc 8.2; hgb 7.1; hct 23.5; mcv 57.6 plt 215; glucose 111; bun 8; creat 1.08; k+ 4.2; na++ 135 ca 8.8; mag 1.6 12-26-19: wbc 7.9; hgb 9.5; hct 30.0 mcv 63.6 plt 205; glucose 110; bun 13; creat 1.21; k+ 4.1; na++ 136; ca 9.2 phos 3.4 albumin 3.6  01-01-20: wbc 10.8; hgb 9.9; hct 32.5; mcv 65.4 plt 282; glucose 99; bun 21; creat 1.41; k+ 3.9; na++ 133;ca 9.1 mag 1.9 free t4: 1.37 free t3: 2.1 tsh 7.150 01-08-20: ANA neg; CRP 15.6 01-09-20: wbc 8.2; hgb 8.2; hct 25.9; mcv 66.6 plt 238;  01-10-20: wbc 10.2; hgb 8.9; hct 28.2; mcv 67.6 plt 290; gucose 117; bun 25; creat 1.59; k+ 4.2; na++ 138; ca 9.2; live normal albumin 3.4 mag 1.9 RPR neg 02-01-20: wbc 10.6; hgb 28.1; mcv 74.1 plt 271; glucose 140 bun 27; creat 1.28; k+ 4.4; an++ 120; ca 8.9 liver normal albumin 3.4 mag 1.9 02-04-20: wbc 4.7; hgb 9.2; hct 28.7; mcv 78.2 plt 273 02-05-20: wbc 5.0; hgb 9.4; hct 29.2; mcv 78.3 plt 270; glucose 95; bun 19; creat 0.90; k+ 3.5; na++ 135; ca 8.6 liver normal albumin 2.6 02-08-20; glucose 85; bun 13; creat 0.91; k+ 3.6; na++ 134; ca 8.6 02-14-20: tsh 6.564 free T3: 2.2 free T4: 1.80 02-16-20: wbc 5.9; hgb 9.6; hct 30.3; mcv 81.9 plt 312; glucose 86; bun 15; creat 1.07; k+ 3.5; na++ 137; ca 8.4  03-01-20: wbc 7.3; hgb 10.1; hct 31.8; mcv 86.6 plt 195; glucose 100; bun 16; creat 1.08 ;k+ 3.5; na++ 137; ca 8.8  03-06-20; wbc 8.4; hgb 11.0; hct 35.7; mcv 87.1 plt 205; glucose 112; bun 17; creat 1.18; k+ 3.8; na++ 134; ca 9.2  liver normal albumin 3.5; ferritin 966; iron 24; tibc 158 vit B 12: 1294; vit D 41.02 03-20-20; wbc 10.9; hgb 10.5; hct 34.3; mcv 89.8 plt 293; glucose 114; bun 18; creat 1.11; k+ 4.4; na++ 139; ca 9.5 liver normal albumin 3.4 vit B 12: 1376; vit D 45.43  04-03-20: wbc 8.7; hgb 10.5; hct 34.3; mcv 91.7 plt 324 04-17-20: wbc 7.6; hgb 10.4; hct 32.9; mcv 92.2 plt 270 05-02-20: wbc 7.2; hgb 10.3; hct 33.0; mcv 94.3 plt 243  TODAY  05-16-20: wbc 7.1; hgb 11.3; hct 35.5; mcv 92.9 plt 255 05-20-20: hgb a1c 4.8; hcol 197; ldl 130; trig 70 hdl 53 urine micro-albumin 15.8 05-30-20: wbc 7.5; hgb 10.8; hct 34.3; mcv 92.5 plt 248    Review of Systems  Constitutional: Negative for malaise/fatigue.  Respiratory: Negative for cough and shortness of breath.   Cardiovascular: Negative for chest pain, palpitations and leg swelling.  Gastrointestinal: Negative for abdominal pain, constipation and heartburn.  Musculoskeletal: Negative for back pain, joint pain and myalgias.  Skin: Negative.   Neurological: Negative for dizziness.  Psychiatric/Behavioral: The patient is not nervous/anxious.       Physical Exam Constitutional:      General: She is not in acute distress.    Appearance: She is underweight. She is not diaphoretic.  Neck:     Thyroid: No thyromegaly.  Cardiovascular:     Rate and Rhythm: Normal rate and regular rhythm.     Pulses: Normal pulses.     Heart sounds: Normal heart sounds.  Pulmonary:     Effort: Pulmonary effort is normal. No respiratory distress.     Breath sounds: Normal breath sounds.  Abdominal:     General: Bowel sounds are normal. There is no distension.     Palpations: Abdomen is soft.     Tenderness: There is no abdominal tenderness.  Musculoskeletal:     Cervical back: Neck supple.     Right lower leg: No edema.     Left lower leg: No edema.     Comments:  Is able to move all extremities  Has poor fine motor abilities     Lymphadenopathy:     Cervical: No cervical  adenopathy.  Skin:    General: Skin is warm and dry.     Comments: Right heel ulceration: 2 x 2 x 0.1 cm   Neurological:     Mental Status: She is alert and oriented to person, place, and time.  Psychiatric:        Mood and Affect: Mood normal.        ASSESSMENT/ PLAN:  TODAY  1. Acute deep vein thrombosis (DVT) of proximal vein of left lower extremity: diagnosed 02-02-20: will continue eliquis 5 mg twice daily   2. Chronic obstructive pulmonary disease unspecified COPD ype: is stable will continue albuterol 2 puffs every 4 hours as needed.   3. Peripheral neuropathy: is stable will continue gabapentin 100 mg nightly   PREVIOUS   4.  CKD stage 3 due to diabetes mellitus type 2: is stable bun 18; creat 1.11  4. Type 2 diabetes mellitus with stage 3 chronic kidney disease without long term current use  of insulin: hgb a1c 4.8 will monitor   5. Other chronic gastritis without hemorrhage:is stable will continue prilosec 40 mg daily reglan 5 mg three times daily   6.  Hypertension associated with stage 3 chronic kidney disease due to type 2 diabetes mellitus: is stable b/p 118/62 will continue norvasc 10 mg daily   7. Anemia due to chronic renal failure treated erythropoietin stage 3: is stable hgb 10.8 will continue to monitor is treated per oncology   8. Increased intraocular pressure bilateral is stable will continue trusopt to both eyes twice daily and xalatan to eyes nightly   9. Vitamin B 12 deficiency is stable will continue monthly b 12 injections and folic acid 1 mg daily   10. Failure to thrive in adult is without change is off megace due to dvt. Completed remeron therapy; weight is stable at 96 pounds.   11. Aortic atherosclerosis is stable will monitor      MD is aware of resident's narcotic use and is in agreement with current plan of care. We will attempt to wean resident as appropriate.  Ok Edwards NP [facility]  Contact [phone] Monday  through Friday 8am- 5pm  After hours call [phone]

## 2020-06-13 ENCOUNTER — Inpatient Hospital Stay (HOSPITAL_COMMUNITY): Payer: Medicare Other

## 2020-06-13 ENCOUNTER — Inpatient Hospital Stay (HOSPITAL_COMMUNITY): Payer: Medicare Other | Attending: Hematology

## 2020-06-13 ENCOUNTER — Encounter (HOSPITAL_COMMUNITY): Payer: Self-pay

## 2020-06-13 ENCOUNTER — Other Ambulatory Visit: Payer: Self-pay

## 2020-06-13 VITALS — BP 130/68 | HR 97 | Temp 97.2°F | Resp 16

## 2020-06-13 DIAGNOSIS — D509 Iron deficiency anemia, unspecified: Secondary | ICD-10-CM

## 2020-06-13 DIAGNOSIS — E860 Dehydration: Secondary | ICD-10-CM

## 2020-06-13 DIAGNOSIS — D631 Anemia in chronic kidney disease: Secondary | ICD-10-CM | POA: Insufficient documentation

## 2020-06-13 DIAGNOSIS — N183 Chronic kidney disease, stage 3 unspecified: Secondary | ICD-10-CM | POA: Diagnosis not present

## 2020-06-13 LAB — CBC
HCT: 32.7 % — ABNORMAL LOW (ref 36.0–46.0)
Hemoglobin: 10.2 g/dL — ABNORMAL LOW (ref 12.0–15.0)
MCH: 29 pg (ref 26.0–34.0)
MCHC: 31.2 g/dL (ref 30.0–36.0)
MCV: 92.9 fL (ref 80.0–100.0)
Platelets: 219 10*3/uL (ref 150–400)
RBC: 3.52 MIL/uL — ABNORMAL LOW (ref 3.87–5.11)
RDW: 14.7 % (ref 11.5–15.5)
WBC: 6.5 10*3/uL (ref 4.0–10.5)
nRBC: 0 % (ref 0.0–0.2)

## 2020-06-13 MED ORDER — EPOETIN ALFA-EPBX 10000 UNIT/ML IJ SOLN
10000.0000 [IU] | Freq: Once | INTRAMUSCULAR | Status: AC
  Administered 2020-06-13: 10000 [IU] via SUBCUTANEOUS

## 2020-06-13 MED ORDER — EPOETIN ALFA-EPBX 10000 UNIT/ML IJ SOLN
INTRAMUSCULAR | Status: AC
  Filled 2020-06-13: qty 1

## 2020-06-13 NOTE — Patient Instructions (Signed)
[facility] at [facility] Discharge Instructions  Received Retacrit injection today. Follow-up as scheduled   Thank you for choosing [facility] at [facility] to provide your oncology and hematology care.  To afford each patient quality time with our provider, please arrive at least 15 minutes before your scheduled appointment time.   If you have a lab appointment with the Cancer Center please come in thru the Main Entrance and check in at the main information desk.  You need to re-schedule your appointment should you arrive 10 or more minutes late.  We strive to give you quality time with our providers, and arriving late affects you and other patients whose appointments are after yours.  Also, if you no show three or more times for appointments you may be dismissed from the clinic at the providers discretion.     Again, thank you for choosing [facility].  Our hope is that these requests will decrease the amount of time that you wait before being seen by our physicians.       _____________________________________________________________  Should you have questions after your visit to [facility], please contact our office at [phone] and follow the prompts.  Our office hours are 8:00 a.m. and 4:30 p.m. Monday - Friday.  Please note that voicemails left after 4:00 p.m. may not be returned until the following business day.  We are closed weekends and major holidays.  You do have access to a nurse 24-7, just call the main number to the clinic [phone] and do not press any options, hold on the line and a nurse will answer the phone.    For prescription refill requests, have your pharmacy contact our office and allow 72 hours.    Due to Covid, you will need to wear a mask upon entering the hospital. If you do not have a mask, a mask will be given to you at the Main Entrance upon arrival. For doctor visits, patients may  have 1 support person age 18 or older with them. For treatment visits, patients can not have anyone with them due to social distancing guidelines and our immunocompromised population.     

## 2020-06-13 NOTE — Progress Notes (Signed)
Traci Parker tolerated Retacrit injection well without complaints or incident. Hgb 10.2 today. VSS Pt discharged via wheelchair in satisfactory condition

## 2020-06-27 ENCOUNTER — Ambulatory Visit (HOSPITAL_COMMUNITY): Payer: Medicare Other | Admitting: Hematology

## 2020-06-27 ENCOUNTER — Ambulatory Visit (HOSPITAL_COMMUNITY): Payer: Medicare Other

## 2020-06-27 ENCOUNTER — Other Ambulatory Visit (HOSPITAL_COMMUNITY): Payer: Medicare Other

## 2020-06-27 NOTE — Progress Notes (Signed)
[address] [place], [place] [address]   CLINIC:  Medical Oncology/Hematology  PCP:  Gerlene Fee, NP [place] [address] [phone]   REASON FOR VISIT: Follow-up for microcytic anemia   CURRENT THERAPY: Epogen injections  INTERVAL HISTORY:  Traci Parker 74 y.o. female returns for routine follow-up for anemia.  She is here by herself. She complains of ongoing fatigue, neuropathy but otherwise denies any new symptoms. No change in breathing, bowel or urinary habits Rest of the ROS reviewed and negative  PAST MEDICAL/SURGICAL HISTORY:  Past Medical History:  Diagnosis Date  . Allergy   . Anemia    pernicious anemia  . Asthma   . Blood transfusion without reported diagnosis   . CKD (chronic kidney disease)   . COPD (chronic obstructive pulmonary disease) ([place]) 12/23/2019  . Gastritis   . Glaucoma   . History of blood in urine   . Hyperlipidemia   . Hypertension   . Ocular hypertension   . Osteoporosis   . Type 2 diabetes mellitus ([place]) 12/23/2019  . Vitamin B12 deficiency anemia due to intrinsic factor deficiency    Past Surgical History:  Procedure Laterality Date  . CHOLECYSTECTOMY  2008  . COLONOSCOPY  04/30/2011   Procedure: COLONOSCOPY;  Surgeon: Rogene Houston, MD;  [facility];  Service: Endoscopy;  Laterality: N/A;  . COLONOSCOPY  05/25/2012   Procedure: COLONOSCOPY;  Surgeon: Rogene Houston, MD;  [facility];  Service: Endoscopy;  Laterality: N/A;  1:25-changed to 1200 Ann to notify pt  . COLONOSCOPY Bilateral 12/2017  . ESOPHAGOGASTRODUODENOSCOPY N/A 12/07/2019   Procedure: ESOPHAGOGASTRODUODENOSCOPY (EGD);  Surgeon: Rogene Houston, MD;  [facility];  Service: Endoscopy;  Laterality: N/A;  155  . GALLBLADDER SURGERY    . right breast cystectomy  1988  . TUBAL LIGATION  1975     SOCIAL HISTORY:  Social History   Socioeconomic History  . Marital status:  Divorced    Spouse name: Not on file  . Number of children: 4  . Years of education: 9  . Highest education level: Not on file  Occupational History  . Occupation: retired   Tobacco Use  . Smoking status: Former Smoker    Packs/day: 1.00    Years: 40.00    Pack years: 40.00    Quit date: 04/03/2011    Years since quitting: 9.2  . Smokeless tobacco: Never Used  Vaping Use  . Vaping Use: Never used  Substance and Sexual Activity  . Alcohol use: No  . Drug use: No  . Sexual activity: Not Currently  Other Topics Concern  . Not on file  Social History Narrative   01/17/20 lives at [facility], [place]   Social Determinants of Health   Financial Resource Strain: Not on file  Food Insecurity: Not on file  Transportation Needs: Not on file  Physical Activity: Not on file  Stress: Not on file  Social Connections: Not on file  Intimate Partner Violence: Not on file    FAMILY HISTORY:  Family History  Problem Relation Age of Onset  . Diabetes Mother   . Hyperlipidemia Mother   . Hypertension Mother   . Heart disease Father   . Hyperlipidemia Father   . Diabetes Sister   . Hyperlipidemia Sister   . Hypertension Sister   . Cancer Brother   . Heart disease Brother   . Hyperlipidemia Brother   .  Hypertension Brother   . Diabetes Sister     CURRENT MEDICATIONS:  No facility-administered encounter medications on file as of 06/28/2020.   Outpatient Encounter Medications as of 06/28/2020  Medication Sig  . acetaminophen (TYLENOL) 325 MG tablet Take 2 tablets (650 mg total) by mouth every 6 (six) hours as needed for mild pain (or Fever >/= 101).  Marland Kitchen albuterol (VENTOLIN HFA) 108 (90 Base) MCG/ACT inhaler Inhale 2 puffs into the lungs every 4 (four) hours as needed for wheezing or shortness of breath.  Marland Kitchen amLODipine (NORVASC) 10 MG tablet Take 1 tablet (10 mg total) by mouth daily.  Marland Kitchen apixaban (ELIQUIS) 5 MG TABS tablet Take 1 tablet (5 mg total) by mouth 2 (two) times daily.   Roseanne Kaufman Peru-Castor Oil (VENELEX) OINT Apply topically 3 (three) times daily. Special Instructions: Apply to sacrum and bilateral buttocks qshift for prevention.  . collagenase (SANTYL) ointment Apply 1 application topically daily. Special Instructions: Apply to wound right heel per tx orders.  . cyanocobalamin (,VITAMIN B-12,) 1000 MCG/ML injection Inject 1 mL (1,000 mcg total) into the muscle every 30 (thirty) days.  . dorzolamide (TRUSOPT) 2 % ophthalmic solution Place 1 drop into both eyes 2 (two) times daily.  . folic acid (FOLVITE) 1 MG tablet Take 1 mg by mouth daily.   Marland Kitchen gabapentin (NEURONTIN) 100 MG capsule Take 100 mg by mouth at bedtime.  Marland Kitchen latanoprost (XALATAN) 0.005 % ophthalmic solution Place 1 drop into both eyes at bedtime.  . metoCLOPramide (REGLAN) 5 MG tablet Take 1 tablet (5 mg total) by mouth 3 (three) times daily before meals.  . NON FORMULARY Mechanical soft diet ALLERGIC TO FISH  . omeprazole (PRILOSEC) 40 MG capsule Take 40 mg by mouth daily.   . ondansetron (ZOFRAN ODT) 4 MG disintegrating tablet Take 1 tablet (4 mg total) by mouth every 8 (eight) hours as needed for nausea or vomiting. Dissolve under tongue (Patient not taking: Reported on 06/28/2020)    ALLERGIES:  Allergies  Allergen Reactions  . Latex Rash  . Levofloxacin Nausea And Vomiting  . Neomycin-Bacitracin-Polymyxin  [Bacitracin-Neomycin-Polymyxin] Rash  . Other Rash and Swelling  . Prednisone Hives, Itching, Rash and Other (See Comments)  . Fish Allergy     rash  . Neosporin [Neomycin-Polymyxin-Gramicidin] Itching and Rash     PHYSICAL EXAM:  ECOG Performance status: 1  Vitals:   06/28/20 1034  BP: 127/77  Pulse: (!) 103  Resp: 16  SpO2: 98%   There were no vitals filed for this visit. Physical Exam Constitutional:      Appearance: Normal appearance.     Comments: Frail petite woman in wheel chair   Cardiovascular:     Rate and Rhythm: Normal rate and regular rhythm.     Heart  sounds: Normal heart sounds.  Pulmonary:     Effort: Pulmonary effort is normal.     Breath sounds: Normal breath sounds.  Abdominal:     Palpations: Abdomen is soft.  Skin:    General: Skin is warm.  Neurological:     Mental Status: She is alert and oriented to person, place, and time. Mental status is at baseline.  Psychiatric:        Mood and Affect: Mood normal.        Behavior: Behavior normal.        Thought Content: Thought content normal.        Judgment: Judgment normal.      LABORATORY DATA:  I have reviewed the  labs as listed.  CBC    Component Value Date/Time   WBC 7.0 06/28/2020 0934   RBC 3.55 (L) 06/28/2020 0934   HGB 10.4 (L) 06/28/2020 0934   HCT 32.5 (L) 06/28/2020 0934   PLT 221 06/28/2020 0934   MCV 91.5 06/28/2020 0934   MCH 29.3 06/28/2020 0934   MCHC 32.0 06/28/2020 0934   RDW 14.6 06/28/2020 0934   LYMPHSABS 3.0 04/03/2020 0843   MONOABS 0.6 04/03/2020 0843   EOSABS 0.4 04/03/2020 0843   BASOSABS 0.1 04/03/2020 0843   CMP Latest Ref Rng & Units 03/20/2020 03/06/2020 03/01/2020  Glucose 70 - 99 mg/dL 114(H) 112(H) 100(H)  BUN 8 - 23 mg/dL 18 17 16   Creatinine 0.44 - 1.00 mg/dL 1.11(H) 1.19(H) 1.08(H)  Sodium 135 - 145 mmol/L 139 134(L) 137  Potassium 3.5 - 5.1 mmol/L 4.4 3.8 3.5  Chloride 98 - 111 mmol/L 102 102 106  CO2 22 - 32 mmol/L 27 22 23   Calcium 8.9 - 10.3 mg/dL 9.5 9.2 8.8(L)  Total Protein 6.5 - 8.1 g/dL 6.6 6.5 -  Total Bilirubin 0.3 - 1.2 mg/dL 0.8 0.7 -  Alkaline Phos 38 - 126 U/L 61 60 -  AST 15 - 41 U/L 9(L) 9(L) -  ALT 0 - 44 U/L 6 8 -    All questions were answered to patient's stated satisfaction. Encouraged patient to call with any new concerns or questions before his next visit to the cancer center and we can certain see him sooner, if needed.     ASSESSMENT & PLAN:  No problem-specific Assessment & Plan notes found for this encounter.  Normocytic anemia: -Last colonoscopy was in June 2019 in [place], 3 polyps  removed. -Etiology is from combination of CKD and relative iron deficiency. -Initial work-up included SPEP which was negative.  Stool was negative for occult blood.  Hemoglobin electrophoresis showed severe microcytosis related to her anemia.  She could have underlying thalassemia/sickle cell trait. -She has a history of needing a blood transfusion in the early 2000's. -She denies any bright red bleeding per rectum or melena. -Labs from today with stable hemoglobin of 10.4 -We have taken over Epogen injections for Dr. Theador Hawthorne.  We have started her on 10,000 units every 2 weeks. -Can hold epo today since hemoglobin is greater than 10. Repeat labs in 2 weeks -She will follow-up in 8 weeks with repeat labs and Epogen injection.  2.  CKD: -Ultrasound the kidneys on 08/22/2019 showed bilateral renal atrophy, severe atrophy of the right kidney. -Creatinine has been stable.  On 03/06/2020 creatinine was 1.19. -She is getting Epogen injections every 2 weeks 10,000 units to titrate hemoglobin to 10-11. -She follows up with nephrology every few months.  3.  E09 and folic acid deficiency: -She is taking folic acid 1 mg daily -She is doing B12 injections monthly.  Disposition: RTC every 2 weeks for labs and Epogen injection. RTC in 8 weeks for MD assessment, labs and Epogen injection.   Orders placed this encounter:  No orders of the defined types were placed in this encounter.  Benay Pike MD

## 2020-06-28 ENCOUNTER — Inpatient Hospital Stay (HOSPITAL_COMMUNITY): Payer: Medicare Other

## 2020-06-28 ENCOUNTER — Ambulatory Visit (HOSPITAL_COMMUNITY): Payer: Medicare Other

## 2020-06-28 ENCOUNTER — Other Ambulatory Visit (HOSPITAL_COMMUNITY): Payer: Medicare Other

## 2020-06-28 ENCOUNTER — Ambulatory Visit (HOSPITAL_COMMUNITY): Payer: Medicare Other | Admitting: Hematology and Oncology

## 2020-06-28 ENCOUNTER — Encounter (HOSPITAL_COMMUNITY): Payer: Self-pay | Admitting: Hematology and Oncology

## 2020-06-28 ENCOUNTER — Inpatient Hospital Stay (HOSPITAL_BASED_OUTPATIENT_CLINIC_OR_DEPARTMENT_OTHER): Payer: Medicare Other | Admitting: Hematology and Oncology

## 2020-06-28 VITALS — BP 127/77 | HR 103 | Resp 16

## 2020-06-28 DIAGNOSIS — D631 Anemia in chronic kidney disease: Secondary | ICD-10-CM | POA: Diagnosis not present

## 2020-06-28 DIAGNOSIS — E860 Dehydration: Secondary | ICD-10-CM

## 2020-06-28 DIAGNOSIS — N183 Chronic kidney disease, stage 3 unspecified: Secondary | ICD-10-CM

## 2020-06-28 DIAGNOSIS — D509 Iron deficiency anemia, unspecified: Secondary | ICD-10-CM

## 2020-06-28 LAB — CBC
HCT: 32.5 % — ABNORMAL LOW (ref 36.0–46.0)
Hemoglobin: 10.4 g/dL — ABNORMAL LOW (ref 12.0–15.0)
MCH: 29.3 pg (ref 26.0–34.0)
MCHC: 32 g/dL (ref 30.0–36.0)
MCV: 91.5 fL (ref 80.0–100.0)
Platelets: 221 10*3/uL (ref 150–400)
RBC: 3.55 MIL/uL — ABNORMAL LOW (ref 3.87–5.11)
RDW: 14.6 % (ref 11.5–15.5)
WBC: 7 10*3/uL (ref 4.0–10.5)
nRBC: 0 % (ref 0.0–0.2)

## 2020-06-28 MED ORDER — EPOETIN ALFA-EPBX 10000 UNIT/ML IJ SOLN: 10000.0000 [IU] | Freq: Once | INTRAMUSCULAR | Status: DC

## 2020-06-28 NOTE — Progress Notes (Unsigned)
Retacrit held today per verbal order from Dr. Chryl Heck.  Hemoglobin today was 10.4.

## 2020-06-30 ENCOUNTER — Encounter: Payer: Self-pay | Admitting: Adult Health

## 2020-07-03 ENCOUNTER — Telehealth: Payer: Self-pay | Admitting: Diagnostic Neuroimaging

## 2020-07-03 NOTE — Telephone Encounter (Signed)
[place] from [facility] called to ask if they should hold apixaban (ELIQUIS) 5 MG TABS tablet for 72 hrs. before appt. Please advise.

## 2020-07-03 NOTE — Telephone Encounter (Signed)
[place] from [facility] called & asked if they should hold apixaban (ELIQUIS) 5 MG TABS tablet 72 hrs. before appt. Please advise.    Best contact: [phone]

## 2020-07-03 NOTE — Telephone Encounter (Signed)
Pt's daughter Rogelia Boga called wanting to speak to the RN regarding the scheduled appt. Pt was to be scheduled for a NCV/EMG but for some reason she was scheduled for a Consult. Daughter would like to know why she was scheduled for a Consult and if not needed can the scheduled appt be switched to a NCV/EMG. Please advise.

## 2020-07-03 NOTE — Telephone Encounter (Signed)
Note from Dr. Leta Baptist on  02/08/20:  Patient's weakness likely related to MRI c-spine findings, but surgery mgmt is high risk per Neurosurgery. She could consider follow up as outpatient with neurosurgery. Weakness also related to failure to thrive / weakness / decreased PO intake. Ok to hold off on EMG testing.  ___________________________________________  The patient's daughter reports her mother is still in the skilled nursing facility. She has completed PT but unable to go home because of her weakness (not able to feed/dress herself, not able to stand unassisted). [name] says the facility informed her that her mother needs to be seen by neurology again for re-evaluation and to discuss medical plan (NCV/EMG, PT). [name] feels there has been a decline since her PT ended. Her daughter plans to attend her follow up with Dr. Leta Baptist on 07/08/20.  ____________________________________________  I also called [place] from [facility] to notify her that the appt scheduled for 07/08/20 is for an office visit and not NCV/EMG. She verbalized understanding.

## 2020-07-08 ENCOUNTER — Encounter: Payer: Self-pay | Admitting: Diagnostic Neuroimaging

## 2020-07-08 ENCOUNTER — Ambulatory Visit (INDEPENDENT_AMBULATORY_CARE_PROVIDER_SITE_OTHER): Payer: Medicare Other | Admitting: Diagnostic Neuroimaging

## 2020-07-08 VITALS — BP 141/94 | HR 108 | Wt 96.4 lb

## 2020-07-08 DIAGNOSIS — R200 Anesthesia of skin: Secondary | ICD-10-CM | POA: Diagnosis not present

## 2020-07-08 DIAGNOSIS — M6281 Muscle weakness (generalized): Secondary | ICD-10-CM | POA: Diagnosis not present

## 2020-07-08 NOTE — Progress Notes (Signed)
[facility]  PATIENT: Traci Parker DOB: 11/14/1945  REFERRING CLINICIAN: Gerlene Fee, NP HISTORY FROM: patient, daughter and brother REASON FOR VISIT: follow up   HISTORICAL  CHIEF COMPLAINT:  Chief Complaint  Patient presents with  . Muscle Weakness    Rm 7 FU, dgtr- [name], resides at [facility], "PT stopped- patient met her goals"    HISTORY OF PRESENT ILLNESS:   UPDATE (07/08/20, VRP): Since last visit, doing poorly; persistent weakness and gait difficulty. Symptoms are progressive. No alleviating or aggravating factors. Tolerating meds. Has tried PT sessions at [facility], but unfortunately has had suboptimal response.  PRIOR HPI (01/17/20): 74 year old female with failure to thrive, weight loss, muscle weakness, numbness, gait difficulty sialorrhea.  Patient has been living alone up until 2021.  Around April 2021 she started to lose appetite.  She started to lose significant weight.  She had gait and balance difficulty, anemia, electrolyte abnormalities, multiple admissions to the hospital.  She was not able to take care of herself.  She transition to skilled nursing facility around June 2021.  Patient also has been having trouble controlling saliva and drooling.  Patient has history of B12 deficiency and anemia in the past, but current levels have been normal.  Patient has bilateral renal atrophy and chronic anemia.  Patient on Epogen injections.    REVIEW OF SYSTEMS: Full 14 system review of systems performed and negative with exception of: As per HPI.  ALLERGIES: Allergies  Allergen Reactions  . Latex Rash  . Levofloxacin Nausea And Vomiting  . Neomycin-Bacitracin-Polymyxin  [Bacitracin-Neomycin-Polymyxin] Rash  . Other Rash and Swelling  . Prednisone Hives, Itching, Rash and Other (See Comments)  . Fish Allergy     rash  . Neosporin [Neomycin-Polymyxin-Gramicidin] Itching and Rash    HOME MEDICATIONS: Facility-Administered Medications Prior  to Visit  Medication Dose Route Frequency Provider Last Rate Last Admin  . epoetin alfa-epbx (RETACRIT) injection 10,000 Units  10,000 Units Subcutaneous Once Derek Jack, MD       Outpatient Medications Prior to Visit  Medication Sig Dispense Refill  . acetaminophen (TYLENOL) 325 MG tablet Take 2 tablets (650 mg total) by mouth every 6 (six) hours as needed for mild pain (or Fever >/= 101). 12 tablet 0  . albuterol (VENTOLIN HFA) 108 (90 Base) MCG/ACT inhaler Inhale 2 puffs into the lungs every 4 (four) hours as needed for wheezing or shortness of breath. 8 g 2  . amLODipine (NORVASC) 10 MG tablet Take 1 tablet (10 mg total) by mouth daily. 30 tablet 1  . apixaban (ELIQUIS) 5 MG TABS tablet Take 1 tablet (5 mg total) by mouth 2 (two) times daily. 60 tablet 5  . Balsam Peru-Castor Oil (VENELEX) OINT Apply topically 3 (three) times daily. Special Instructions: Apply to sacrum and bilateral buttocks qshift for prevention.    . collagenase (SANTYL) ointment Apply 1 application topically daily. Special Instructions: Apply to wound right heel per tx orders.    . cyanocobalamin (,VITAMIN B-12,) 1000 MCG/ML injection Inject 1 mL (1,000 mcg total) into the muscle every 30 (thirty) days. 30 mL 0  . dorzolamide (TRUSOPT) 2 % ophthalmic solution Place 1 drop into both eyes 2 (two) times daily.    . folic acid (FOLVITE) 1 MG tablet Take 1 mg by mouth daily.     Marland Kitchen gabapentin (NEURONTIN) 100 MG capsule Take 100 mg by mouth at bedtime.    Marland Kitchen latanoprost (XALATAN) 0.005 % ophthalmic solution Place 1 drop into both eyes  at bedtime.    . metoCLOPramide (REGLAN) 5 MG tablet Take 1 tablet (5 mg total) by mouth 3 (three) times daily before meals. 90 tablet 1  . NON FORMULARY Mechanical soft diet ALLERGIC TO FISH    . omeprazole (PRILOSEC) 40 MG capsule Take 40 mg by mouth daily.     . ondansetron (ZOFRAN ODT) 4 MG disintegrating tablet Take 1 tablet (4 mg total) by mouth every 8 (eight) hours as needed for  nausea or vomiting. Dissolve under tongue 60 tablet 2    PAST MEDICAL HISTORY: Past Medical History:  Diagnosis Date  . Allergy   . Anemia    pernicious anemia  . Asthma   . Blood transfusion without reported diagnosis   . CKD (chronic kidney disease)   . COPD (chronic obstructive pulmonary disease) ([place]) 12/23/2019  . Gastritis   . Glaucoma   . History of blood in urine   . Hyperlipidemia   . Hypertension   . Ocular hypertension   . Osteoporosis   . Type 2 diabetes mellitus ([place]) 12/23/2019  . Vitamin B12 deficiency anemia due to intrinsic factor deficiency     PAST SURGICAL HISTORY: Past Surgical History:  Procedure Laterality Date  . CHOLECYSTECTOMY  2008  . COLONOSCOPY  04/30/2011   Procedure: COLONOSCOPY;  Surgeon: Rogene Houston, MD;  [facility];  Service: Endoscopy;  Laterality: N/A;  . COLONOSCOPY  05/25/2012   Procedure: COLONOSCOPY;  Surgeon: Rogene Houston, MD;  [facility];  Service: Endoscopy;  Laterality: N/A;  1:25-changed to 1200 Ann to notify pt  . COLONOSCOPY Bilateral 12/2017  . ESOPHAGOGASTRODUODENOSCOPY N/A 12/07/2019   Procedure: ESOPHAGOGASTRODUODENOSCOPY (EGD);  Surgeon: Rogene Houston, MD;  [facility];  Service: Endoscopy;  Laterality: N/A;  155  . GALLBLADDER SURGERY    . right breast cystectomy  1988  . TUBAL LIGATION  1975    FAMILY HISTORY: Family History  Problem Relation Age of Onset  . Diabetes Mother   . Hyperlipidemia Mother   . Hypertension Mother   . Heart disease Father   . Hyperlipidemia Father   . Diabetes Sister   . Hyperlipidemia Sister   . Hypertension Sister   . Cancer Brother   . Heart disease Brother   . Hyperlipidemia Brother   . Hypertension Brother   . Diabetes Sister     SOCIAL HISTORY: Social History   Socioeconomic History  . Marital status: Divorced    Spouse name: Not on file  . Number of children: 4  . Years of education: 9  . Highest education level: Not on  file  Occupational History  . Occupation: retired   Tobacco Use  . Smoking status: Former Smoker    Packs/day: 1.00    Years: 40.00    Pack years: 40.00    Quit date: 04/03/2011    Years since quitting: 9.2  . Smokeless tobacco: Never Used  Vaping Use  . Vaping Use: Never used  Substance and Sexual Activity  . Alcohol use: No  . Drug use: No  . Sexual activity: Not Currently  Other Topics Concern  . Not on file  Social History Narrative   01/17/20 lives at [facility], [place]   Social Determinants of Health   Financial Resource Strain: Not on file  Food Insecurity: Not on file  Transportation Needs: Not on file  Physical Activity: Not on file  Stress: Not on file  Social Connections: Not on file  Intimate  Partner Violence: Not on file     PHYSICAL EXAM  GENERAL EXAM/CONSTITUTIONAL: Vitals:  Vitals:   07/08/20 1531  BP: (!) 141/94  Pulse: (!) 108  Weight: 96 lb 6.4 oz (43.7 kg)   Body mass index is 16.55 kg/m. Wt Readings from Last 15 Encounters:  07/08/20 96 lb 6.4 oz (43.7 kg)  06/12/20 96 lb (43.5 kg)  06/05/20 96 lb (43.5 kg)  05/13/20 96 lb 9.6 oz (43.8 kg)  05/10/20 96 lb 10 oz (43.8 kg)  05/02/20 96 lb 9.6 oz (43.8 kg)  05/02/20 97 lb 9.6 oz (44.3 kg)  04/12/20 97 lb 9.6 oz (44.3 kg)  04/04/20 113 lb 12.8 oz (51.6 kg)  03/14/20 113 lb (51.3 kg)  03/13/20 113 lb 12.8 oz (51.6 kg)  03/08/20 113 lb 12.8 oz (51.6 kg)  02/29/20 113 lb 12.8 oz (51.6 kg)  02/19/20 113 lb 12.8 oz (51.6 kg)  02/13/20 109 lb 3.2 oz (49.5 kg)    Patient is in no distress; well developed, nourished and groomed; neck is supple  CARDIOVASCULAR:  Examination of carotid arteries is normal; no carotid bruits  Regular rate and rhythm, no murmurs  Examination of peripheral vascular system by observation and palpation is normal  EYES:  Ophthalmoscopic exam of optic discs and posterior segments is normal; no papilledema or hemorrhages No exam data  present  MUSCULOSKELETAL:  Gait, strength, tone, movements noted in Neurologic exam below  NEUROLOGIC: MENTAL STATUS:  No flowsheet data found.  awake, alert, oriented to person, place and time  recent and remote memory intact  normal attention and concentration  language fluent, comprehension intact, naming intact  fund of knowledge appropriate  CRANIAL NERVE:   2nd - no papilledema on fundoscopic exam  2nd, 3rd, 4th, 6th - pupils equal and reactive to light, visual fields full to confrontation, extraocular muscles intact, no nystagmus  5th - facial sensation symmetric  7th - facial strength symmetric  8th - hearing intact  9th - palate elevates symmetrically, uvula midline  11th - shoulder shrug symmetric  12th - tongue protrusion midline  MOTOR:   MUSCLE ATROPHY  INCREASED TONE IN BUE AND BLE  BUE 3  BLE 2-3 PROX AND 4 DISTAL  SENSORY:   normal and symmetric to light touch; DECR IN FEET AND LEGS  COORDINATION:   finger-nose-finger, fine finger movements SLOW  REFLEXES:   deep tendon reflexes TRACE and symmetric  GAIT/STATION:   IN WHEELCHAIR     DIAGNOSTIC DATA (LABS, IMAGING, TESTING) - I reviewed patient records, labs, notes, testing and imaging myself where available.  Lab Results  Component Value Date   WBC 7.0 06/28/2020   HGB 10.4 (L) 06/28/2020   HCT 32.5 (L) 06/28/2020   MCV 91.5 06/28/2020   PLT 221 06/28/2020      Component Value Date/Time   NA 139 03/20/2020 1214   K 4.4 03/20/2020 1214   CL 102 03/20/2020 1214   CO2 27 03/20/2020 1214   GLUCOSE 114 (H) 03/20/2020 1214   BUN 18 03/20/2020 1214   CREATININE 1.11 (H) 03/20/2020 1214   CREATININE 1.38 (H) 03/24/2019 1049   CALCIUM 9.5 03/20/2020 1214   PROT 6.6 03/20/2020 1214   PROT 6.5 01/17/2020 1201   ALBUMIN 3.4 (L) 03/20/2020 1214   AST 9 (L) 03/20/2020 1214   ALT 6 03/20/2020 1214   ALKPHOS 61 03/20/2020 1214   BILITOT 0.8 03/20/2020 1214   GFRNONAA 49  (L) 03/20/2020 1214   GFRAA 56 (L) 03/20/2020  1214   Lab Results  Component Value Date   CHOL 197 05/20/2020   HDL 53 05/20/2020   LDLCALC 130 (H) 05/20/2020   TRIG 70 05/20/2020   CHOLHDL 3.7 05/20/2020   Lab Results  Component Value Date   HGBA1C 4.8 05/20/2020   Lab Results  Component Value Date   VITAMINB12 1,376 (H) 03/20/2020   Lab Results  Component Value Date   TSH 6.564 (H) 02/14/2020    01/05/20 MRI brain 1. Significantly motion degraded study. 2. No acute intracranial abnormality identified. 3. Moderate parenchymal volume loss and probable sequela of chronic small vessel ischemia.   02/05/20 MRI cervical spine 1. Widespread cervical spine degeneration. No acute osseous abnormality. 2. Multilevel mild to moderate spinal stenosis and spinal cord mass effect at C3-C4 through C6-C7, with possible spinal cord myelomalacia at the C6 level. No other cord signal abnormality.  3. Moderate or severe degenerative neural foraminal stenosis at the bilateral C5 and C6 nerve levels.   02/05/20 MRI thoracic spine  - Normal for age MRI appearance of the thoracic spine.    ASSESSMENT AND PLAN  74 y.o. year old female here with loss of appetite, weight loss, anemia, chronic kidney disease, history of B12 deficiency, with progressive weakness, numbness and spasticity.  Dx:  1. Muscle weakness   2. Numbness     PLAN:  WEAKNESS / NUMBNESS / SPASTICITY (due to cervical myelopathy + failure to thrive; possible history of B12 deficiency neuropathy) - supportive care - continue PT / OT / ST eval and tx  Return for pending if symptoms worsen or fail to improve.    Penni Bombard, MD 00/34/9611, 6:43 PM Certified in Neurology, Neurophysiology and Neuroimaging  [facility] [address] [place], [place] [address] [phone]

## 2020-07-08 NOTE — Patient Instructions (Signed)
  WEAKNESS / NUMBNESS / SPASTICITY (due to cervical myelopathy + failure to thrive; possible history of B12 deficiency neuropathy)  - continue supportive care  - continue PT / OT / ST eval and tx

## 2020-07-10 DIAGNOSIS — Z1159 Encounter for screening for other viral diseases: Secondary | ICD-10-CM | POA: Diagnosis not present

## 2020-07-10 DIAGNOSIS — I82402 Acute embolism and thrombosis of unspecified deep veins of left lower extremity: Secondary | ICD-10-CM | POA: Diagnosis not present

## 2020-07-11 ENCOUNTER — Non-Acute Institutional Stay (SKILLED_NURSING_FACILITY): Payer: Medicare Other | Admitting: Adult Health

## 2020-07-11 ENCOUNTER — Encounter: Payer: Self-pay | Admitting: Adult Health

## 2020-07-11 DIAGNOSIS — I824Y2 Acute embolism and thrombosis of unspecified deep veins of left proximal lower extremity: Secondary | ICD-10-CM

## 2020-07-11 DIAGNOSIS — I700 Atherosclerosis of aorta: Secondary | ICD-10-CM

## 2020-07-11 DIAGNOSIS — J449 Chronic obstructive pulmonary disease, unspecified: Secondary | ICD-10-CM | POA: Diagnosis not present

## 2020-07-11 NOTE — Progress Notes (Signed)
Location:  [facility] Room Number: 150/P Place of Service:  SNF (31)   CODE STATUS: DNR  Allergies  Allergen Reactions  . Latex Rash  . Levofloxacin Nausea And Vomiting  . Neomycin-Bacitracin-Polymyxin  [Bacitracin-Neomycin-Polymyxin] Rash  . Other Rash and Swelling  . Prednisone Hives, Itching, Rash and Other (See Comments)  . Fish Allergy     rash  . Neosporin [Neomycin-Polymyxin-Gramicidin] Itching and Rash    Chief Complaint  Patient presents with  . Acute Visit    Care Plan Meeting     HPI:  [name] BIMS 15/15 mood 8/30. She requires extensive to dependent assist with her adls. She is incontinent of bladder and frequently incontinent of bowel. Is able to feed herself. She has had one fall on 02-18-20 without injury. Her weight is stable 96.4 pounds has a fair appetite. January 6th will be 6 months of eliquis therapy. Her right heel ulceration has resolved. Family states that she is having diarrhea no reports from facility staff. She does have pain on her sacrum where her ulceration used to be. Her family would like to have therapy restarted will setup a screen. They would like for her ensure to be restarted as well. She continues to be followed for her chronic illnesses including: Aortic atherosclerosis Acute deep vein thrombosis (DVT) of proximal vein of left lower extremity  Chronic pulmonary obstructive disease unspecified COPD type   Past Medical History:  Diagnosis Date  . Allergy   . Anemia    pernicious anemia  . Asthma   . Blood transfusion without reported diagnosis   . CKD (chronic kidney disease)   . COPD (chronic obstructive pulmonary disease) ([place]) 12/23/2019  . Gastritis   . Glaucoma   . History of blood in urine   . Hyperlipidemia   . Hypertension   . Ocular hypertension   . Osteoporosis   . Type 2 diabetes mellitus ([place]) 12/23/2019  . Vitamin B12 deficiency anemia due to  intrinsic factor deficiency     Past Surgical History:  Procedure Laterality Date  . CHOLECYSTECTOMY  2008  . COLONOSCOPY  04/30/2011   Procedure: COLONOSCOPY;  Surgeon: Rogene Houston, MD;  [facility];  Service: Endoscopy;  Laterality: N/A;  . COLONOSCOPY  05/25/2012   Procedure: COLONOSCOPY;  Surgeon: Rogene Houston, MD;  [facility];  Service: Endoscopy;  Laterality: N/A;  1:25-changed to 1200 Ann to notify pt  . COLONOSCOPY Bilateral 12/2017  . ESOPHAGOGASTRODUODENOSCOPY N/A 12/07/2019   Procedure: ESOPHAGOGASTRODUODENOSCOPY (EGD);  Surgeon: Rogene Houston, MD;  [facility];  Service: Endoscopy;  Laterality: N/A;  155  . GALLBLADDER SURGERY    . right breast cystectomy  1988  . TUBAL LIGATION  1975    Social History   Socioeconomic History  . Marital status: Divorced    Spouse name: Not on file  . Number of children: 4  . Years of education: 9  . Highest education level: Not on file  Occupational History  . Occupation: retired   Tobacco Use  . Smoking status: Former Smoker    Packs/day: 1.00    Years: 40.00    Pack years: 40.00    Quit date: 04/03/2011    Years since quitting: 9.2  . Smokeless tobacco: Never Used  Vaping Use  . Vaping Use: Never used  Substance and Sexual Activity  . Alcohol use: No  . Drug use: No  .  Sexual activity: Not Currently  Other Topics Concern  . Not on file  Social History Narrative   01/17/20 lives at [facility], [place]   Social Determinants of Health   Financial Resource Strain: Not on file  Food Insecurity: Not on file  Transportation Needs: Not on file  Physical Activity: Not on file  Stress: Not on file  Social Connections: Not on file  Intimate Partner Violence: Not on file   Family History  Problem Relation Age of Onset  . Diabetes Mother   . Hyperlipidemia Mother   . Hypertension Mother   . Heart disease Father   . Hyperlipidemia Father   . Diabetes Sister   .  Hyperlipidemia Sister   . Hypertension Sister   . Cancer Brother   . Heart disease Brother   . Hyperlipidemia Brother   . Hypertension Brother   . Diabetes Sister       VITAL SIGNS BP 114/66   Pulse 90   Temp 98.2 F (36.8 C)   Resp 20   Ht 5\' 4"  (1.626 m)   Wt 96 lb 6.4 oz (43.7 kg)   SpO2 93%   BMI 16.55 kg/m   Facility-Administered Encounter Medications as of 07/11/2020  Medication  . epoetin alfa-epbx (RETACRIT) injection 10,000 Units   Outpatient Encounter Medications as of 07/11/2020  Medication Sig  . acetaminophen (TYLENOL) 325 MG tablet Take 2 tablets (650 mg total) by mouth every 6 (six) hours as needed for mild pain (or Fever >/= 101).  Marland Kitchen albuterol (VENTOLIN HFA) 108 (90 Base) MCG/ACT inhaler Inhale 2 puffs into the lungs every 4 (four) hours as needed for wheezing or shortness of breath.  Marland Kitchen amLODipine (NORVASC) 10 MG tablet Take 1 tablet (10 mg total) by mouth daily.  Marland Kitchen apixaban (ELIQUIS) 5 MG TABS tablet Take 1 tablet (5 mg total) by mouth 2 (two) times daily.  Roseanne Kaufman Peru-Castor Oil (VENELEX) OINT Apply topically 3 (three) times daily. Special Instructions: Apply to sacrum and bilateral buttocks qshift for prevention.  . collagenase (SANTYL) ointment Apply 1 application topically daily. Special Instructions: Apply to wound right heel per tx orders.  . cyanocobalamin (,VITAMIN B-12,) 1000 MCG/ML injection Inject 1 mL (1,000 mcg total) into the muscle every 30 (thirty) days.  . dorzolamide (TRUSOPT) 2 % ophthalmic solution Place 1 drop into both eyes 2 (two) times daily.  . folic acid (FOLVITE) 1 MG tablet Take 1 mg by mouth daily.   Marland Kitchen gabapentin (NEURONTIN) 100 MG capsule Take 100 mg by mouth at bedtime.  Marland Kitchen latanoprost (XALATAN) 0.005 % ophthalmic solution Place 1 drop into both eyes at bedtime.  . metoCLOPramide (REGLAN) 5 MG tablet Take 1 tablet (5 mg total) by mouth 3 (three) times daily before meals.  . NON FORMULARY Mechanical soft diet ALLERGIC TO FISH   . omeprazole (PRILOSEC) 40 MG capsule Take 40 mg by mouth daily.   . ondansetron (ZOFRAN ODT) 4 MG disintegrating tablet Take 1 tablet (4 mg total) by mouth every 8 (eight) hours as needed for nausea or vomiting. Dissolve under tongue     SIGNIFICANT DIAGNOSTIC EXAMS   PREVIOUS   12-23-19: chest x-ray: No active disease.   12-23-19: ct angio of chest:  1. No evidence of pulmonary embolism. 2. Paraseptal emphysema, mild and worse at the lung apices. 3. 3 mm nodule along the fissure in the RIGHT lower lobe. . 4. Moderate and with 3.6 cm caliber of ascending thoracic aorta.. Aortic aneurysm  5. Emphysema and  aortic atherosclerosis  02-02-20: chest x-ray: COPD changes without acute infiltrate   02-02-20: bilateral lower extremity venous ultrasound:  Extensive acute appearing left femoropopliteal occlusive DVT extending into the calf veins. Negative for right lower extremity DVT  02-05-20: MRI cervical spine:  1. Widespread cervical spine degeneration. No acute osseous abnormality. 2. Multilevel mild to moderate spinal stenosis and spinal cord mass effect at C3-C4 through C6-C7, with possible spinal cord myelomalacia at the C6 level. No other cord signal abnormality. 3. Moderate or severe degenerative neural foraminal stenosis at the bilateral C5 and C6 nerve levels.   02-05-20: MRI thoracic spine:  Normal for age MRI appearance of the thoracic spine   02-06-20: ct of chest abdomen and pelvis:  1. No definite evidence of malignancy in the chest, abdomen, or pelvis. 2. Unchanged small bilateral lung nodules measuring up to 3 mm. Non-contrast chest CT can be considered in 12 months.  3. Large stool burden with rectal distension and presacral inflammation which may reflect stercoral proctitis. 4. Known left lower extremity DVT. 5. Nonobstructing nephrolithiasis. 6. Aortic Atherosclerosis and Emphysema .  NO NEW EXAMS.   Review of Systems  Constitutional: Negative for malaise/fatigue.   Respiratory: Negative for cough and shortness of breath.   Cardiovascular: Negative for chest pain, palpitations and leg swelling.  Gastrointestinal: Negative for abdominal pain, constipation and heartburn.  Musculoskeletal: Negative for back pain, joint pain and myalgias.  Skin: Negative.   Neurological: Negative for dizziness.  Psychiatric/Behavioral: The patient is not nervous/anxious.     Physical Exam Constitutional:      General: She is not in acute distress.    Appearance: She is well-developed and well-nourished. She is not diaphoretic.  Neck:     Thyroid: No thyromegaly.  Cardiovascular:     Rate and Rhythm: Normal rate and regular rhythm.     Pulses: Normal pulses and intact distal pulses.     Heart sounds: Normal heart sounds.  Pulmonary:     Effort: Pulmonary effort is normal. No respiratory distress.     Breath sounds: Normal breath sounds.  Abdominal:     General: Bowel sounds are normal. There is no distension.     Palpations: Abdomen is soft.     Tenderness: There is no abdominal tenderness.  Musculoskeletal:        General: No edema.     Cervical back: Neck supple.     Right lower leg: No edema.     Left lower leg: No edema.     Comments: Is able to move all extremities  Has poor fine motor abilities      Lymphadenopathy:     Cervical: No cervical adenopathy.  Skin:    General: Skin is warm and dry.  Neurological:     Mental Status: She is alert and oriented to person, place, and time.  Psychiatric:        Mood and Affect: Mood and affect and mood normal.      ASSESSMENT/ PLAN:  TODAY  1. Aortic atherosclerosis 2. Acute deep vein thrombosis (DVT) of proximal vein of left lower extremity  3. Chronic pulmonary obstructive disease unspecified COPD type  Will continue current medications Will continue current plan of care Will continue to monitor her status.   MD is aware of resident's narcotic use and is in agreement with current plan of care.  We will attempt to wean resident as appropriate.  Synthia Innocent NP [facility]  Contact [phone] Monday through Friday 8am- 5pm  After hours call [phone]

## 2020-07-15 DIAGNOSIS — Z1159 Encounter for screening for other viral diseases: Secondary | ICD-10-CM | POA: Diagnosis not present

## 2020-07-15 DIAGNOSIS — I82402 Acute embolism and thrombosis of unspecified deep veins of left lower extremity: Secondary | ICD-10-CM | POA: Diagnosis not present

## 2020-07-16 ENCOUNTER — Other Ambulatory Visit: Payer: Self-pay

## 2020-07-16 ENCOUNTER — Non-Acute Institutional Stay (SKILLED_NURSING_FACILITY): Payer: Medicare Other | Admitting: Adult Health

## 2020-07-16 ENCOUNTER — Inpatient Hospital Stay (HOSPITAL_COMMUNITY): Payer: Medicare Other | Attending: Hematology

## 2020-07-16 ENCOUNTER — Encounter: Payer: Self-pay | Admitting: Adult Health

## 2020-07-16 ENCOUNTER — Inpatient Hospital Stay (HOSPITAL_COMMUNITY): Payer: Medicare Other

## 2020-07-16 VITALS — BP 121/71 | HR 98 | Resp 18

## 2020-07-16 DIAGNOSIS — D631 Anemia in chronic kidney disease: Secondary | ICD-10-CM | POA: Diagnosis not present

## 2020-07-16 DIAGNOSIS — E860 Dehydration: Secondary | ICD-10-CM

## 2020-07-16 DIAGNOSIS — K2950 Unspecified chronic gastritis without bleeding: Secondary | ICD-10-CM

## 2020-07-16 DIAGNOSIS — N189 Chronic kidney disease, unspecified: Secondary | ICD-10-CM | POA: Diagnosis not present

## 2020-07-16 DIAGNOSIS — N1832 Chronic kidney disease, stage 3b: Secondary | ICD-10-CM | POA: Diagnosis not present

## 2020-07-16 DIAGNOSIS — E1122 Type 2 diabetes mellitus with diabetic chronic kidney disease: Secondary | ICD-10-CM | POA: Diagnosis not present

## 2020-07-16 DIAGNOSIS — E119 Type 2 diabetes mellitus without complications: Secondary | ICD-10-CM

## 2020-07-16 DIAGNOSIS — U071 COVID-19: Secondary | ICD-10-CM

## 2020-07-16 DIAGNOSIS — N183 Chronic kidney disease, stage 3 unspecified: Secondary | ICD-10-CM

## 2020-07-16 DIAGNOSIS — D509 Iron deficiency anemia, unspecified: Secondary | ICD-10-CM

## 2020-07-16 LAB — CBC
HCT: 31.8 % — ABNORMAL LOW (ref 36.0–46.0)
Hemoglobin: 10.1 g/dL — ABNORMAL LOW (ref 12.0–15.0)
MCH: 28.9 pg (ref 26.0–34.0)
MCHC: 31.8 g/dL (ref 30.0–36.0)
MCV: 90.9 fL (ref 80.0–100.0)
Platelets: 219 10*3/uL (ref 150–400)
RBC: 3.5 MIL/uL — ABNORMAL LOW (ref 3.87–5.11)
RDW: 14.8 % (ref 11.5–15.5)
WBC: 6.8 10*3/uL (ref 4.0–10.5)
nRBC: 0 % (ref 0.0–0.2)

## 2020-07-16 MED ORDER — EPOETIN ALFA-EPBX 10000 UNIT/ML IJ SOLN
10000.0000 [IU] | Freq: Once | INTRAMUSCULAR | Status: AC
  Administered 2020-07-16: 10000 [IU] via SUBCUTANEOUS
  Filled 2020-07-16: qty 1

## 2020-07-16 NOTE — Progress Notes (Signed)
Location:  [facility] Room Number: 150-P Place of Service:  SNF (31)   CODE STATUS: DNR  Allergies  Allergen Reactions  . Latex Rash  . Levofloxacin Nausea And Vomiting  . Neomycin-Bacitracin-Polymyxin  [Bacitracin-Neomycin-Polymyxin] Rash  . Other Rash and Swelling  . Prednisone Hives, Itching, Rash and Other (See Comments)  . Fish Allergy     rash  . Neosporin [Neomycin-Polymyxin-Gramicidin] Itching and Rash    Chief Complaint  Patient presents with  . Medical Management of Chronic Issues         CKD stage 3 due to diabetes mellitus type 2  Type 2 diabetes mellitus with stage 3 chronic kidney disease without long term current use of insulin:  Other chronic gastritis without hemorrhage     HPI:  She is a 75 year old long term resident of this facility being seen for the management of her chronic illnessesCKD stage 3 due to diabetes mellitus type 2  Type 2 diabetes mellitus with stage 3 chronic kidney disease without long term current use of insulin:  Other chronic gastritis without hemorrhage. She denies any uncontrolled pain; she has lost weight now at 92 pounds. She denies any anxiety or depressive thoughts.  She had COVID test done today which is positive. She is asymptomatic; has taken all of her vaccines. She will need vitamin d and zinc. :   Past Medical History:  Diagnosis Date  . Allergy   . Anemia    pernicious anemia  . Asthma   . Blood transfusion without reported diagnosis   . CKD (chronic kidney disease)   . COPD (chronic obstructive pulmonary disease) ([place]) 12/23/2019  . Gastritis   . Glaucoma   . History of blood in urine   . Hyperlipidemia   . Hypertension   . Ocular hypertension   . Osteoporosis   . Type 2 diabetes mellitus ([place]) 12/23/2019  . Vitamin B12 deficiency anemia due to intrinsic factor deficiency     Past Surgical History:  Procedure Laterality Date  . CHOLECYSTECTOMY  2008  . COLONOSCOPY  04/30/2011   Procedure:  COLONOSCOPY;  Surgeon: Rogene Houston, MD;  [facility];  Service: Endoscopy;  Laterality: N/A;  . COLONOSCOPY  05/25/2012   Procedure: COLONOSCOPY;  Surgeon: Rogene Houston, MD;  [facility];  Service: Endoscopy;  Laterality: N/A;  1:25-changed to 1200 Ann to notify pt  . COLONOSCOPY Bilateral 12/2017  . ESOPHAGOGASTRODUODENOSCOPY N/A 12/07/2019   Procedure: ESOPHAGOGASTRODUODENOSCOPY (EGD);  Surgeon: Rogene Houston, MD;  [facility];  Service: Endoscopy;  Laterality: N/A;  155  . GALLBLADDER SURGERY    . right breast cystectomy  1988  . TUBAL LIGATION  1975    Social History   Socioeconomic History  . Marital status: Divorced    Spouse name: Not on file  . Number of children: 4  . Years of education: 9  . Highest education level: Not on file  Occupational History  . Occupation: retired   Tobacco Use  . Smoking status: Former Smoker    Packs/day: 1.00    Years: 40.00    Pack years: 40.00    Quit date: 04/03/2011    Years since quitting: 9.2  . Smokeless tobacco: Never Used  Vaping Use  . Vaping Use: Never used  Substance and Sexual Activity  . Alcohol use: No  . Drug use: No  . Sexual activity: Not Currently  Other Topics Concern  . Not on file  Social History Narrative   01/17/20 lives at [facility], [place]   Social Determinants of Health   Financial Resource Strain: Not on file  Food Insecurity: Not on file  Transportation Needs: Not on file  Physical Activity: Not on file  Stress: Not on file  Social Connections: Not on file  Intimate Partner Violence: Not on file   Family History  Problem Relation Age of Onset  . Diabetes Mother   . Hyperlipidemia Mother   . Hypertension Mother   . Heart disease Father   . Hyperlipidemia Father   . Diabetes Sister   . Hyperlipidemia Sister   . Hypertension Sister   . Cancer Brother   . Heart disease Brother   . Hyperlipidemia Brother   . Hypertension Brother   . Diabetes  Sister       VITAL SIGNS BP 110/66   Pulse 81   Temp 98.2 F (36.8 C)   Resp 20   Ht 5\' 4"  (1.626 m)   Wt 92 lb 6.4 oz (41.9 kg)   SpO2 93%   BMI 15.86 kg/m   Facility-Administered Encounter Medications as of 07/16/2020  Medication  . epoetin alfa-epbx (RETACRIT) injection 10,000 Units   Outpatient Encounter Medications as of 07/16/2020  Medication Sig  . acetaminophen (TYLENOL) 325 MG tablet Take 2 tablets (650 mg total) by mouth every 6 (six) hours as needed for mild pain (or Fever >/= 101).  09/13/2020 albuterol (VENTOLIN HFA) 108 (90 Base) MCG/ACT inhaler Inhale 2 puffs into the lungs every 4 (four) hours as needed for wheezing or shortness of breath.  Marland Kitchen amLODipine (NORVASC) 10 MG tablet Take 1 tablet (10 mg total) by mouth daily.  Marland Kitchen apixaban (ELIQUIS) 5 MG TABS tablet Take 1 tablet (5 mg total) by mouth 2 (two) times daily.  Marland Kitchen Peru-Castor Oil (VENELEX) OINT Apply topically 3 (three) times daily. Special Instructions: Apply to sacrum and bilateral buttocks qshift for prevention.  . cyanocobalamin (,VITAMIN B-12,) 1000 MCG/ML injection Inject 1 mL (1,000 mcg total) into the muscle every 30 (thirty) days.  . dorzolamide (TRUSOPT) 2 % ophthalmic solution Place 1 drop into both eyes 2 (two) times daily.  . Ensure (ENSURE) Take 237 mLs by mouth 3 (three) times daily between meals.  . folic acid (FOLVITE) 1 MG tablet Take 1 mg by mouth daily.   Lucilla Lame gabapentin (NEURONTIN) 100 MG capsule Take 100 mg by mouth at bedtime.  Marland Kitchen latanoprost (XALATAN) 0.005 % ophthalmic solution Place 1 drop into both eyes at bedtime.  . metoCLOPramide (REGLAN) 5 MG tablet Take 1 tablet (5 mg total) by mouth 3 (three) times daily before meals.  . NON FORMULARY Mechanical soft diet ALLERGIC TO FISH  . omeprazole (PRILOSEC) 40 MG capsule Take 40 mg by mouth daily.   . ondansetron (ZOFRAN ODT) 4 MG disintegrating tablet Take 1 tablet (4 mg total) by mouth every 8 (eight) hours as needed for nausea or vomiting.  Dissolve under tongue  . [DISCONTINUED] collagenase (SANTYL) ointment Apply 1 application topically daily. Special Instructions: Apply to wound right heel per tx orders.     SIGNIFICANT DIAGNOSTIC EXAMS   PREVIOUS   12-23-19: chest x-ray: No active disease.   12-23-19: ct angio of chest:  1. No evidence of pulmonary embolism. 2. Paraseptal emphysema, mild and worse at the lung apices. 3. 3 mm nodule along the fissure in the RIGHT lower lobe. . 4. Moderate and with 3.6 cm caliber of ascending thoracic aorta.. Aortic aneurysm  5.  Emphysema and aortic atherosclerosis  02-02-20: chest x-ray: COPD changes without acute infiltrate   02-02-20: bilateral lower extremity venous ultrasound:  Extensive acute appearing left femoropopliteal occlusive DVT extending into the calf veins. Negative for right lower extremity DVT  02-05-20: MRI cervical spine:  1. Widespread cervical spine degeneration. No acute osseous abnormality. 2. Multilevel mild to moderate spinal stenosis and spinal cord mass effect at C3-C4 through C6-C7, with possible spinal cord myelomalacia at the C6 level. No other cord signal abnormality. 3. Moderate or severe degenerative neural foraminal stenosis at the bilateral C5 and C6 nerve levels.   02-05-20: MRI thoracic spine:  Normal for age MRI appearance of the thoracic spine   02-06-20: ct of chest abdomen and pelvis:  1. No definite evidence of malignancy in the chest, abdomen, or pelvis. 2. Unchanged small bilateral lung nodules measuring up to 3 mm. Non-contrast chest CT can be considered in 12 months.  3. Large stool burden with rectal distension and presacral inflammation which may reflect stercoral proctitis. 4. Known left lower extremity DVT. 5. Nonobstructing nephrolithiasis. 6. Aortic Atherosclerosis and Emphysema .  NO NEW EXAMS.     LABS REVIEWED PREVIOUS   12-23-19: wbc 8.2; hgb 7.3; hct 25.2; mcv 58.3 plt 220; glucose 114; bun 15; creat 1.46; k+ 3.2; na++  137 ca 9.4 liver normal albumin 4.1 d-dimer 0.86; mag 1.5; phos 3.3; urine culture: e-coli 12-25-19: wbc 8.2; hgb 7.1; hct 23.5; mcv 57.6 plt 215; glucose 111; bun 8; creat 1.08; k+ 4.2; na++ 135 ca 8.8; mag 1.6 12-26-19: wbc 7.9; hgb 9.5; hct 30.0 mcv 63.6 plt 205; glucose 110; bun 13; creat 1.21; k+ 4.1; na++ 136; ca 9.2 phos 3.4 albumin 3.6  01-01-20: wbc 10.8; hgb 9.9; hct 32.5; mcv 65.4 plt 282; glucose 99; bun 21; creat 1.41; k+ 3.9; na++ 133;ca 9.1 mag 1.9 free t4: 1.37 free t3: 2.1 tsh 7.150 01-08-20: ANA neg; CRP 15.6 01-09-20: wbc 8.2; hgb 8.2; hct 25.9; mcv 66.6 plt 238;  01-10-20: wbc 10.2; hgb 8.9; hct 28.2; mcv 67.6 plt 290; gucose 117; bun 25; creat 1.59; k+ 4.2; na++ 138; ca 9.2; live normal albumin 3.4 mag 1.9 RPR neg 02-01-20: wbc 10.6; hgb 28.1; mcv 74.1 plt 271; glucose 140 bun 27; creat 1.28; k+ 4.4; an++ 120; ca 8.9 liver normal albumin 3.4 mag 1.9 02-04-20: wbc 4.7; hgb 9.2; hct 28.7; mcv 78.2 plt 273 02-05-20: wbc 5.0; hgb 9.4; hct 29.2; mcv 78.3 plt 270; glucose 95; bun 19; creat 0.90; k+ 3.5; na++ 135; ca 8.6 liver normal albumin 2.6 02-08-20; glucose 85; bun 13; creat 0.91; k+ 3.6; na++ 134; ca 8.6 02-14-20: tsh 6.564 free T3: 2.2 free T4: 1.80 02-16-20: wbc 5.9; hgb 9.6; hct 30.3; mcv 81.9 plt 312; glucose 86; bun 15; creat 1.07; k+ 3.5; na++ 137; ca 8.4  03-01-20: wbc 7.3; hgb 10.1; hct 31.8; mcv 86.6 plt 195; glucose 100; bun 16; creat 1.08 ;k+ 3.5; na++ 137; ca 8.8  03-06-20; wbc 8.4; hgb 11.0; hct 35.7; mcv 87.1 plt 205; glucose 112; bun 17; creat 1.18; k+ 3.8; na++ 134; ca 9.2 liver normal albumin 3.5; ferritin 966; iron 24; tibc 158 vit B 12: 1294; vit D 41.02 03-20-20; wbc 10.9; hgb 10.5; hct 34.3; mcv 89.8 plt 293; glucose 114; bun 18; creat 1.11; k+ 4.4; na++ 139; ca 9.5 liver normal albumin 3.4 vit B 12: 1376; vit D 45.43  04-03-20: wbc 8.7; hgb 10.5; hct 34.3; mcv 91.7 plt 324 04-17-20: wbc 7.6; hgb 10.4; hct  32.9; mcv 92.2 plt 270 05-02-20: wbc 7.2; hgb 10.3; hct 33.0; mcv 94.3  plt 243 05-16-20: wbc 7.1; hgb 11.3; hct 35.5; mcv 92.9 plt 255 05-20-20: hgb a1c 4.8; hcol 197; ldl 130; trig 70 hdl 53 urine micro-albumin 15.8 05-30-20: wbc 7.5; hgb 10.8; hct 34.3; mcv 92.5 plt 248    NO NEW LABS.   Review of Systems  Constitutional: Negative for malaise/fatigue.  Respiratory: Negative for cough and shortness of breath.   Cardiovascular: Negative for chest pain, palpitations and leg swelling.  Gastrointestinal: Negative for abdominal pain, constipation and heartburn.  Musculoskeletal: Negative for back pain, joint pain and myalgias.  Skin: Negative.   Neurological: Negative for dizziness.  Psychiatric/Behavioral: The patient is not nervous/anxious.      Physical Exam Constitutional:      General: She is not in acute distress.    Appearance: She is well-developed and well-nourished. She is not diaphoretic.  Neck:     Thyroid: No thyromegaly.  Cardiovascular:     Rate and Rhythm: Normal rate and regular rhythm.     Pulses: Normal pulses and intact distal pulses.     Heart sounds: Normal heart sounds.  Pulmonary:     Effort: Pulmonary effort is normal. No respiratory distress.     Breath sounds: Normal breath sounds.  Abdominal:     General: Bowel sounds are normal. There is no distension.     Palpations: Abdomen is soft.     Tenderness: There is no abdominal tenderness.  Musculoskeletal:        General: No edema.     Cervical back: Neck supple.     Right lower leg: No edema.     Left lower leg: No edema.     Comments: Is able to move all extremities  Has poor fine motor abilities       Lymphadenopathy:     Cervical: No cervical adenopathy.  Skin:    General: Skin is warm and dry.  Neurological:     Mental Status: She is alert and oriented to person, place, and time.  Psychiatric:        Mood and Affect: Mood and affect and mood normal.        ASSESSMENT/ PLAN:  TODAY  1. COVID 19 comorbid diabetes mellitus: is presently without symptoms has  received all three vaccine doses; will begin zinc and vitamin D for 2 weeks. Will check d-dimer; crp will check CMP on 07-19-19.   2 .CKD stage 3 due to diabetes mellitus type 2 is stable bun 18 creat 1.11  3. Type 2 diabetes mellitus with stage 3 chronic kidney disease without long term current use of insulin: is stable hgb a1c 4.8 will monitor   4. Other chronic gastritis without hemorrhage is stable will continue prilosec 40 mg daily and reglan 5 mg prior to melas.   PREVIOUS    5. Hypertension associated with stage 3 chronic kidney disease due to type 2 diabetes mellitus: is stable b/p 110/66 will continue norvasc 10 mg daily   6 Anemia due to chronic renal failure treated erythropoietin stage 3: is stable hgb 10.8 will continue to monitor is treated per oncology   7. Increased intraocular pressure bilateral is stable will continue trusopt to both eyes twice daily and xalatan to eyes nightly   8. Vitamin B 12 deficiency is stable will continue monthly b 12 injections and folic acid 1 mg daily   9. Failure to thrive in adult is without change is off  megace due to dvt. Completed remeron therapy; weight 92 pounds.   10. Aortic atherosclerosis is stable (CT 12-23-19)  will monitor   11. Acute deep vein thrombosis (DVT) of proximal vein of left lower extremity: diagnosed 02-02-20: will continue eliquis 5 mg twice daily   12. Chronic obstructive pulmonary disease unspecified COPD type: is stable will continue albuterol 2 puffs every 4 hours as needed.   13. Peripheral neuropathy: is stable will continue gabapentin 100 mg nightly      MD is aware of resident's narcotic use and is in agreement with current plan of care. We will attempt to wean resident as appropriate.  Ok Edwards NP [facility]  Contact [phone] Monday through Friday 8am- 5pm  After hours call [phone]

## 2020-07-16 NOTE — Progress Notes (Signed)
Patient tolerated Retacrit injection with no complaints voiced.  Site clean and dry with no bruising or swelling noted.  No complaints of pain.  Discharged with vital signs stable and no signs or symptoms of distress noted.  

## 2020-07-17 ENCOUNTER — Other Ambulatory Visit (HOSPITAL_COMMUNITY)
Admission: RE | Admit: 2020-07-17 | Discharge: 2020-07-17 | Disposition: A | Discharge status: Home / Self Care | Payer: Medicare Other | Source: Skilled Nursing Facility | Attending: Adult Health | Admitting: Adult Health

## 2020-07-17 DIAGNOSIS — I129 Hypertensive chronic kidney disease with stage 1 through stage 4 chronic kidney disease, or unspecified chronic kidney disease: Secondary | ICD-10-CM | POA: Diagnosis not present

## 2020-07-17 DIAGNOSIS — D631 Anemia in chronic kidney disease: Secondary | ICD-10-CM | POA: Diagnosis not present

## 2020-07-17 DIAGNOSIS — E1122 Type 2 diabetes mellitus with diabetic chronic kidney disease: Secondary | ICD-10-CM | POA: Diagnosis not present

## 2020-07-17 DIAGNOSIS — N183 Chronic kidney disease, stage 3 unspecified: Secondary | ICD-10-CM | POA: Insufficient documentation

## 2020-07-17 LAB — D-DIMER, QUANTITATIVE: D-Dimer, Quant: 0.33 ug/mL-FEU (ref 0.00–0.50)

## 2020-07-17 LAB — C-REACTIVE PROTEIN: CRP: 0.6 mg/dL (ref <1.0)

## 2020-07-18 ENCOUNTER — Other Ambulatory Visit (HOSPITAL_COMMUNITY)
Admission: RE | Admit: 2020-07-18 | Discharge: 2020-07-18 | Disposition: A | Discharge status: Home / Self Care | Payer: Medicare Other | Source: Skilled Nursing Facility | Attending: Adult Health | Admitting: Adult Health

## 2020-07-18 DIAGNOSIS — I1310 Hypertensive heart and chronic kidney disease without heart failure, with stage 1 through stage 4 chronic kidney disease, or unspecified chronic kidney disease: Secondary | ICD-10-CM | POA: Insufficient documentation

## 2020-07-18 LAB — COMPREHENSIVE METABOLIC PANEL
ALT: 8 U/L (ref 0–44)
AST: 7 U/L — ABNORMAL LOW (ref 15–41)
Albumin: 3.4 g/dL — ABNORMAL LOW (ref 3.5–5.0)
Alkaline Phosphatase: 37 U/L — ABNORMAL LOW (ref 38–126)
Anion gap: 10 (ref 5–15)
BUN: 19 mg/dL (ref 8–23)
CO2: 27 mmol/L (ref 22–32)
Calcium: 9.2 mg/dL (ref 8.9–10.3)
Chloride: 101 mmol/L (ref 98–111)
Creatinine, Ser: 1.11 mg/dL — ABNORMAL HIGH (ref 0.44–1.00)
GFR, Estimated: 52 mL/min — ABNORMAL LOW (ref >60)
Glucose, Bld: 91 mg/dL (ref 70–99)
Potassium: 3.7 mmol/L (ref 3.5–5.1)
Sodium: 138 mmol/L (ref 135–145)
Total Bilirubin: 0.6 mg/dL (ref 0.3–1.2)
Total Protein: 5.9 g/dL — ABNORMAL LOW (ref 6.5–8.1)

## 2020-07-19 ENCOUNTER — Other Ambulatory Visit (HOSPITAL_COMMUNITY)
Admission: RE | Admit: 2020-07-19 | Discharge: 2020-07-19 | Disposition: A | Discharge status: Home / Self Care | Payer: Medicare Other | Source: Skilled Nursing Facility | Attending: Internal Medicine | Admitting: Internal Medicine

## 2020-07-19 DIAGNOSIS — E119 Type 2 diabetes mellitus without complications: Secondary | ICD-10-CM | POA: Insufficient documentation

## 2020-07-19 DIAGNOSIS — D509 Iron deficiency anemia, unspecified: Secondary | ICD-10-CM | POA: Insufficient documentation

## 2020-07-19 DIAGNOSIS — U071 COVID-19: Secondary | ICD-10-CM | POA: Insufficient documentation

## 2020-07-19 LAB — CBC
HCT: 26.5 % — ABNORMAL LOW (ref 36.0–46.0)
Hemoglobin: 8.4 g/dL — ABNORMAL LOW (ref 12.0–15.0)
MCH: 29.1 pg (ref 26.0–34.0)
MCHC: 31.7 g/dL (ref 30.0–36.0)
MCV: 91.7 fL (ref 80.0–100.0)
Platelets: 194 10*3/uL (ref 150–400)
RBC: 2.89 MIL/uL — ABNORMAL LOW (ref 3.87–5.11)
RDW: 15.1 % (ref 11.5–15.5)
WBC: 5.2 10*3/uL (ref 4.0–10.5)
nRBC: 0 % (ref 0.0–0.2)

## 2020-07-19 LAB — RENAL FUNCTION PANEL
Albumin: 3.4 g/dL — ABNORMAL LOW (ref 3.5–5.0)
Anion gap: 7 (ref 5–15)
BUN: 20 mg/dL (ref 8–23)
CO2: 29 mmol/L (ref 22–32)
Calcium: 9.2 mg/dL (ref 8.9–10.3)
Chloride: 100 mmol/L (ref 98–111)
Creatinine, Ser: 1.04 mg/dL — ABNORMAL HIGH (ref 0.44–1.00)
GFR, Estimated: 56 mL/min — ABNORMAL LOW (ref >60)
Glucose, Bld: 83 mg/dL (ref 70–99)
Phosphorus: 3.7 mg/dL (ref 2.5–4.6)
Potassium: 3.5 mmol/L (ref 3.5–5.1)
Sodium: 136 mmol/L (ref 135–145)

## 2020-07-26 ENCOUNTER — Other Ambulatory Visit (HOSPITAL_COMMUNITY): Payer: Self-pay | Admitting: *Deleted

## 2020-07-30 ENCOUNTER — Inpatient Hospital Stay (HOSPITAL_COMMUNITY): Payer: Medicare Other

## 2020-07-30 ENCOUNTER — Encounter (HOSPITAL_COMMUNITY): Payer: Self-pay

## 2020-07-30 ENCOUNTER — Other Ambulatory Visit: Payer: Self-pay

## 2020-07-30 ENCOUNTER — Other Ambulatory Visit (HOSPITAL_COMMUNITY)
Admission: RE | Admit: 2020-07-30 | Discharge: 2020-07-30 | Disposition: A | Discharge status: Home / Self Care | Payer: Medicare Other | Source: Skilled Nursing Facility | Attending: Internal Medicine | Admitting: Internal Medicine

## 2020-07-30 ENCOUNTER — Ambulatory Visit (HOSPITAL_COMMUNITY): Payer: Medicare Other

## 2020-07-30 VITALS — BP 135/78 | HR 99 | Temp 96.9°F | Resp 16

## 2020-07-30 DIAGNOSIS — E860 Dehydration: Secondary | ICD-10-CM

## 2020-07-30 DIAGNOSIS — N1832 Chronic kidney disease, stage 3b: Secondary | ICD-10-CM | POA: Diagnosis not present

## 2020-07-30 DIAGNOSIS — D631 Anemia in chronic kidney disease: Secondary | ICD-10-CM | POA: Diagnosis not present

## 2020-07-30 DIAGNOSIS — N189 Chronic kidney disease, unspecified: Secondary | ICD-10-CM | POA: Diagnosis not present

## 2020-07-30 DIAGNOSIS — D509 Iron deficiency anemia, unspecified: Secondary | ICD-10-CM

## 2020-07-30 LAB — CBC
HCT: 32.8 % — ABNORMAL LOW (ref 36.0–46.0)
Hemoglobin: 10.3 g/dL — ABNORMAL LOW (ref 12.0–15.0)
MCH: 29 pg (ref 26.0–34.0)
MCHC: 31.4 g/dL (ref 30.0–36.0)
MCV: 92.4 fL (ref 80.0–100.0)
Platelets: 225 10*3/uL (ref 150–400)
RBC: 3.55 MIL/uL — ABNORMAL LOW (ref 3.87–5.11)
RDW: 15.9 % — ABNORMAL HIGH (ref 11.5–15.5)
WBC: 6.5 10*3/uL (ref 4.0–10.5)
nRBC: 0 % (ref 0.0–0.2)

## 2020-07-30 LAB — PROTEIN / CREATININE RATIO, URINE
Creatinine, Urine: 98.26 mg/dL
Protein Creatinine Ratio: 0.15 mg/mg{Cre} (ref 0.00–0.15)
Total Protein, Urine: 15 mg/dL

## 2020-07-30 MED ORDER — EPOETIN ALFA-EPBX 10000 UNIT/ML IJ SOLN
INTRAMUSCULAR | Status: AC
  Filled 2020-07-30: qty 1

## 2020-07-30 MED ORDER — EPOETIN ALFA-EPBX 10000 UNIT/ML IJ SOLN
10000.0000 [IU] | Freq: Once | INTRAMUSCULAR | Status: AC
  Administered 2020-07-30: 10000 [IU] via SUBCUTANEOUS

## 2020-07-30 NOTE — Patient Instructions (Signed)
You received your retacrit injection today.  Follow up in 2 weeks as instructed.

## 2020-07-30 NOTE — Progress Notes (Signed)
Patient is here today for retacrit per MD orders.  Labs reviewed with Dr. Delton Coombes.  Okay to proceed with Retacrit.   Injection given, see MAR for administration information.  Patient tolerated without incidence.  Discharged via wheelchair in stable condition from clinic back to nursing home with aide.  Follow up as scheduled.

## 2020-07-31 DIAGNOSIS — N189 Chronic kidney disease, unspecified: Secondary | ICD-10-CM | POA: Diagnosis not present

## 2020-07-31 DIAGNOSIS — I129 Hypertensive chronic kidney disease with stage 1 through stage 4 chronic kidney disease, or unspecified chronic kidney disease: Secondary | ICD-10-CM | POA: Diagnosis not present

## 2020-07-31 DIAGNOSIS — E871 Hypo-osmolality and hyponatremia: Secondary | ICD-10-CM | POA: Diagnosis not present

## 2020-07-31 DIAGNOSIS — D638 Anemia in other chronic diseases classified elsewhere: Secondary | ICD-10-CM | POA: Diagnosis not present

## 2020-07-31 DIAGNOSIS — E1122 Type 2 diabetes mellitus with diabetic chronic kidney disease: Secondary | ICD-10-CM | POA: Diagnosis not present

## 2020-08-01 ENCOUNTER — Encounter: Payer: Self-pay | Admitting: Adult Health

## 2020-08-13 ENCOUNTER — Other Ambulatory Visit: Payer: Self-pay

## 2020-08-13 ENCOUNTER — Encounter (HOSPITAL_COMMUNITY): Payer: Self-pay

## 2020-08-13 ENCOUNTER — Inpatient Hospital Stay (HOSPITAL_COMMUNITY): Payer: Medicare Other | Attending: Hematology

## 2020-08-13 ENCOUNTER — Inpatient Hospital Stay (HOSPITAL_COMMUNITY): Payer: Medicare Other

## 2020-08-13 VITALS — BP 128/71 | HR 97 | Temp 97.0°F | Resp 16

## 2020-08-13 DIAGNOSIS — D509 Iron deficiency anemia, unspecified: Secondary | ICD-10-CM

## 2020-08-13 DIAGNOSIS — E538 Deficiency of other specified B group vitamins: Secondary | ICD-10-CM | POA: Diagnosis not present

## 2020-08-13 DIAGNOSIS — D631 Anemia in chronic kidney disease: Secondary | ICD-10-CM | POA: Diagnosis not present

## 2020-08-13 DIAGNOSIS — N189 Chronic kidney disease, unspecified: Secondary | ICD-10-CM | POA: Insufficient documentation

## 2020-08-13 DIAGNOSIS — Z87891 Personal history of nicotine dependence: Secondary | ICD-10-CM | POA: Insufficient documentation

## 2020-08-13 DIAGNOSIS — E860 Dehydration: Secondary | ICD-10-CM

## 2020-08-13 LAB — CBC
HCT: 35.2 % — ABNORMAL LOW (ref 36.0–46.0)
Hemoglobin: 10.9 g/dL — ABNORMAL LOW (ref 12.0–15.0)
MCH: 28.8 pg (ref 26.0–34.0)
MCHC: 31 g/dL (ref 30.0–36.0)
MCV: 93.1 fL (ref 80.0–100.0)
Platelets: 224 10*3/uL (ref 150–400)
RBC: 3.78 MIL/uL — ABNORMAL LOW (ref 3.87–5.11)
RDW: 15.7 % — ABNORMAL HIGH (ref 11.5–15.5)
WBC: 7.9 10*3/uL (ref 4.0–10.5)
nRBC: 0 % (ref 0.0–0.2)

## 2020-08-13 MED ORDER — EPOETIN ALFA-EPBX 10000 UNIT/ML IJ SOLN
10000.0000 [IU] | Freq: Once | INTRAMUSCULAR | Status: AC
  Administered 2020-08-13: 10000 [IU] via SUBCUTANEOUS
  Filled 2020-08-13: qty 1

## 2020-08-13 NOTE — Progress Notes (Signed)
Patient presents today for Retacrit injection. Hemoglobin reviewed prior to administration. VSS. Injection tolerated without incident or complaint. See MAR for details. Patient discharged in satisfactory condition with follow up instructions. 

## 2020-08-16 ENCOUNTER — Encounter: Payer: Self-pay | Admitting: Internal Medicine

## 2020-08-16 ENCOUNTER — Non-Acute Institutional Stay (SKILLED_NURSING_FACILITY): Payer: Medicare Other | Admitting: Internal Medicine

## 2020-08-16 DIAGNOSIS — E1122 Type 2 diabetes mellitus with diabetic chronic kidney disease: Secondary | ICD-10-CM

## 2020-08-16 DIAGNOSIS — Z860100 Personal history of colon polyps, unspecified: Secondary | ICD-10-CM

## 2020-08-16 DIAGNOSIS — E538 Deficiency of other specified B group vitamins: Secondary | ICD-10-CM

## 2020-08-16 DIAGNOSIS — J449 Chronic obstructive pulmonary disease, unspecified: Secondary | ICD-10-CM

## 2020-08-16 DIAGNOSIS — G959 Disease of spinal cord, unspecified: Secondary | ICD-10-CM

## 2020-08-16 DIAGNOSIS — R6889 Other general symptoms and signs: Secondary | ICD-10-CM | POA: Diagnosis not present

## 2020-08-16 DIAGNOSIS — E440 Moderate protein-calorie malnutrition: Secondary | ICD-10-CM | POA: Diagnosis not present

## 2020-08-16 DIAGNOSIS — N183 Chronic kidney disease, stage 3 unspecified: Secondary | ICD-10-CM | POA: Diagnosis not present

## 2020-08-16 DIAGNOSIS — I10 Essential (primary) hypertension: Secondary | ICD-10-CM

## 2020-08-16 DIAGNOSIS — L89610 Pressure ulcer of right heel, unstageable: Secondary | ICD-10-CM | POA: Diagnosis not present

## 2020-08-16 NOTE — Assessment & Plan Note (Signed)
Last TSH on record was 02/14/2020 with a value of 6.56.  In view of the cold intolerance and fluctuations in weight; TSH should be updated.

## 2020-08-16 NOTE — Assessment & Plan Note (Signed)
07/19/2020 creatinine 1.04 and GFR 56 compatible with CKD stage IIIa.  Avoid nephrotoxic drugs.

## 2020-08-16 NOTE — Progress Notes (Signed)
NURSING HOME LOCATION:  [facility] ROOM NUMBER: 150/P   CODE STATUS: DNR   PCP: Gerlene Fee, NP   This is a nursing facility follow up visit of chronic medical diagnoses and to document compliance with Regulation 483.30 (c) in [place] Phase 2 which mandates caregiver visit ( visits can alternate among physician, PA or NP as per statutes) within 10 days of 30 days / 60 days/ 90 days post admission to SNF date    Interim medical record and care since last [place] visit was updated with review of diagnostic studies and change in clinical status since last visit were documented.  HPI: She is permanent resident with medical diagnoses of B12 deficiency anemia, diabetes with neuropathic complications, osteoporosis, essential hypertension, dyslipidemia, glaucoma, COPD, CKD, history of asthma & PMH of colonic tubular adenoma. There is a 40-pack-year history of smoking. Surgical procedures include cholecystectomy, multiple colonoscopies, and EGD.  Labs are current except for B12 level.  It had been checked repeatedly and was consistently supranormal.  On 1/7 albumin was 3.4, creatinine 1.04, and GFR 56.  On 2/1 CBC revealed H/H of 10.9/35.2 with normochromic, normocytic indices.  This represented progressive improvement in the mild anemia.  Review of systems: She is alert and oriented and delightful conversationalist.  She states that her weight is up and down.  She states that she wants more physical therapy as she "still cannot walk".  She stated that the Neurologist told her it was a problem in her neck and spine.  She does have numbness in her feet.  She will have some slight stool incontinence with urination at times.  She has cold intolerance which she attributes to" 2 types of anemia".  Constitutional: No fever, significant weight change  Eyes: No redness, discharge, pain, vision change ENT/mouth: No nasal congestion,  purulent  discharge, earache, change in hearing, sore throat  Cardiovascular: No chest pain, palpitations, paroxysmal nocturnal dyspnea, claudication, edema  Respiratory: No cough, sputum production, hemoptysis, DOE, significant snoring, apnea   Gastrointestinal: No heartburn, dysphagia, abdominal pain, nausea /vomiting, rectal bleeding, melena, change in bowels Genitourinary: No dysuria, hematuria, pyuria, incontinence, nocturia Musculoskeletal: No joint stiffness, joint swelling,pain Dermatologic: No rash, pruritus, change in appearance of skin Neurologic: No dizziness, headache, syncope, seizures Psychiatric: No significant anxiety, depression, insomnia, anorexia Endocrine: No change in hair/skin/nails, excessive thirst, excessive hunger, excessive urination  Hematologic/lymphatic: No significant bruising, lymphadenopathy, abnormal bleeding Allergy/immunology: No itchy/watery eyes, significant sneezing, urticaria, angioedema  Physical exam:  Pertinent or positive findings: She is thin.The tear glands are prominent.  The lower lids appear edematous.  Eyebrows are decreased laterally.  She is edentulous.  A gallop type cadence is present with increased second heart sound.  Slight tachycardia is present.  Pedal pulses are decreased.  Strength is fair to opposition in the lower extremities.  Clubbing of the nailbeds is suggested.  There is flexion of the right index finger at the DIP joint.  General appearance: no acute distress, increased work of breathing is present.   Lymphatic: No lymphadenopathy about the head, neck, axilla. Eyes: No conjunctival inflammation or lid edema is present. There is no scleral icterus. Ears:  External ear exam shows no significant lesions or deformities.   Nose:  External nasal examination shows no deformity or inflammation. Nasal mucosa are pink and moist without lesions, exudates Oral exam:  Lips and gums are healthy appearing. There is no oropharyngeal erythema or  exudate. Neck:  No thyromegaly, masses, tenderness noted.    Heart:  No murmur, click, rub .  Lungs: Chest clear to auscultation without wheezes, rhonchi, rales, rubs. Abdomen: Bowel sounds are normal. Abdomen is soft and nontender with no organomegaly, hernias, masses. GU: Deferred  Extremities:  No cyanosis, edema  Neurologic exam :Balance, Rhomberg, finger to nose testing could not be completed due to clinical state Skin: Warm & dry w/o tenting. No significant lesions or rash.  See summary under each active problem in the Problem List with associated updated therapeutic plan

## 2020-08-16 NOTE — Assessment & Plan Note (Signed)
B12 level is not current but it has consistently been supranormal based on multiple lab documentations. Present anemia is mild and actually improving.  H/H 10.9/35.2 with normochromic, normocytic indices.  No macrocytosis present.

## 2020-08-16 NOTE — Patient Instructions (Signed)
See assessment and plan under each diagnosis in the problem list and acutely for this visit 

## 2020-08-16 NOTE — Assessment & Plan Note (Addendum)
Wound Care Nurse reports that the ulcer has resolved.

## 2020-08-16 NOTE — Assessment & Plan Note (Signed)
BP controlled; no change in antihypertensive medications  

## 2020-08-16 NOTE — Assessment & Plan Note (Signed)
Additional PT authorization will be requested. Monitor B12 level at least annually although it has been normal repeatedly.

## 2020-08-17 NOTE — Assessment & Plan Note (Signed)
Despite age , she plans to continue survelliance colonoscopies.

## 2020-08-17 NOTE — Assessment & Plan Note (Signed)
Clinically stable on present regimen 

## 2020-08-20 ENCOUNTER — Encounter: Payer: Self-pay | Admitting: Internal Medicine

## 2020-08-26 DIAGNOSIS — L602 Onychogryphosis: Secondary | ICD-10-CM | POA: Diagnosis not present

## 2020-08-26 DIAGNOSIS — L84 Corns and callosities: Secondary | ICD-10-CM | POA: Diagnosis not present

## 2020-08-26 DIAGNOSIS — I739 Peripheral vascular disease, unspecified: Secondary | ICD-10-CM | POA: Diagnosis not present

## 2020-08-27 ENCOUNTER — Encounter (HOSPITAL_COMMUNITY): Payer: Self-pay | Admitting: Hematology

## 2020-08-27 ENCOUNTER — Inpatient Hospital Stay (HOSPITAL_BASED_OUTPATIENT_CLINIC_OR_DEPARTMENT_OTHER): Payer: Medicare Other | Admitting: Hematology

## 2020-08-27 ENCOUNTER — Inpatient Hospital Stay (HOSPITAL_COMMUNITY): Payer: Medicare Other

## 2020-08-27 ENCOUNTER — Other Ambulatory Visit: Payer: Self-pay

## 2020-08-27 VITALS — BP 147/75 | HR 93 | Temp 96.8°F | Resp 16

## 2020-08-27 DIAGNOSIS — E538 Deficiency of other specified B group vitamins: Secondary | ICD-10-CM | POA: Diagnosis not present

## 2020-08-27 DIAGNOSIS — N183 Chronic kidney disease, stage 3 unspecified: Secondary | ICD-10-CM | POA: Diagnosis not present

## 2020-08-27 DIAGNOSIS — D631 Anemia in chronic kidney disease: Secondary | ICD-10-CM

## 2020-08-27 DIAGNOSIS — Z87891 Personal history of nicotine dependence: Secondary | ICD-10-CM | POA: Diagnosis not present

## 2020-08-27 DIAGNOSIS — N189 Chronic kidney disease, unspecified: Secondary | ICD-10-CM | POA: Diagnosis not present

## 2020-08-27 DIAGNOSIS — D509 Iron deficiency anemia, unspecified: Secondary | ICD-10-CM

## 2020-08-27 LAB — CBC
HCT: 35 % — ABNORMAL LOW (ref 36.0–46.0)
Hemoglobin: 11 g/dL — ABNORMAL LOW (ref 12.0–15.0)
MCH: 29.7 pg (ref 26.0–34.0)
MCHC: 31.4 g/dL (ref 30.0–36.0)
MCV: 94.6 fL (ref 80.0–100.0)
Platelets: 218 10*3/uL (ref 150–400)
RBC: 3.7 MIL/uL — ABNORMAL LOW (ref 3.87–5.11)
RDW: 15.5 % (ref 11.5–15.5)
WBC: 7.8 10*3/uL (ref 4.0–10.5)
nRBC: 0 % (ref 0.0–0.2)

## 2020-08-27 NOTE — Progress Notes (Signed)
Traci Parker, Traci Parker [id number]   CLINIC:  Medical Oncology/Hematology  PCP:  Gerlene Fee, NP [address] [place] [address]  [phone]  REASON FOR VISIT:  Follow-up for anemia due to CKD  PRIOR THERAPY: None  CURRENT THERAPY: Retacrit injections every 2 weeks  INTERVAL HISTORY:  [name], a 75 y.o. female, returns for routine follow-up for her anemia due to CKD. Traci Parker was last seen by Dr. Arletha Pili Iruku on 06/28/2020.  Today she reports feeling okay. She is tolerating Retacrit and Eliquis well and denies having any nosebleeds, hematochezia or hematuria. She is taking folic acid and vitamin B12 injections daily. She denies having any new aches or pain.   REVIEW OF SYSTEMS:  Review of Systems  Constitutional: Positive for appetite change (50%) and fatigue (75%).  HENT:   Negative for nosebleeds.   Cardiovascular: Positive for leg swelling (L foot swelling).  Gastrointestinal: Positive for diarrhea. Negative for blood in stool.  Genitourinary: Negative for hematuria.   Musculoskeletal: Negative for arthralgias and myalgias.  Neurological: Positive for numbness (feet).  All other systems reviewed and are negative.   PAST MEDICAL/SURGICAL HISTORY:  Past Medical History:  Diagnosis Date  . Allergy   . Anemia    pernicious anemia  . Asthma   . Blood transfusion without reported diagnosis   . CKD (chronic kidney disease)   . COPD (chronic obstructive pulmonary disease) ([place]) 12/23/2019  . Gastritis   . Glaucoma   . History of blood in urine   . Hyperlipidemia   . Hypertension   . Ocular hypertension   . Osteoporosis   . Type 2 diabetes mellitus ([place]) 12/23/2019  . Vitamin B12 deficiency anemia due to intrinsic factor deficiency    Past Surgical History:  Procedure Laterality Date  . CHOLECYSTECTOMY  2008  . COLONOSCOPY  04/30/2011   Procedure: COLONOSCOPY;  Surgeon: Rogene Houston, MD;  [facility];  Service: Endoscopy;  Laterality: N/A;  . COLONOSCOPY  05/25/2012   Procedure: COLONOSCOPY;  Surgeon: Rogene Houston, MD;  [facility];  Service: Endoscopy;  Laterality: N/A;  1:25-changed to 1200 Ann to notify pt  . COLONOSCOPY Bilateral 12/2017  . ESOPHAGOGASTRODUODENOSCOPY N/A 12/07/2019   Procedure: ESOPHAGOGASTRODUODENOSCOPY (EGD);  Surgeon: Rogene Houston, MD;  Location: [facility];  Service: Endoscopy;  Laterality: N/A;  155  . GALLBLADDER SURGERY    . right breast cystectomy  1988  . TUBAL LIGATION  1975    SOCIAL HISTORY:  Social History   Socioeconomic History  . Marital status: Divorced    Spouse name: Not on file  . Number of children: 4  . Years of education: 9  . Highest education level: Not on file  Occupational History  . Occupation: retired   Tobacco Use  . Smoking status: Former Smoker    Packs/day: 1.00    Years: 40.00    Pack years: 40.00    Quit date: 04/03/2011    Years since quitting: 9.4  . Smokeless tobacco: Never Used  Vaping Use  . Vaping Use: Never used  Substance and Sexual Activity  . Alcohol use: No  . Drug use: No  . Sexual activity: Not Currently  Other Topics Concern  . Not on file  Social History Narrative   01/17/[birth year] lives at [facility], [place]   Social Determinants of Health   Financial Resource Strain: Not on file  Food  Insecurity: Not on file  Transportation Needs: Not on file  Physical Activity: Not on file  Stress: Not on file  Social Connections: Not on file  Intimate Partner Violence: Not on file    FAMILY HISTORY:  Family History  Problem Relation Age of Onset  . Diabetes Mother   . Hyperlipidemia Mother   . Hypertension Mother   . Heart disease Father   . Hyperlipidemia Father   . Diabetes Sister   . Hyperlipidemia Sister   . Hypertension Sister   . Cancer Brother   . Heart disease Brother   . Hyperlipidemia Brother   . Hypertension Brother   . Diabetes Sister     CURRENT  MEDICATIONS:  No current facility-administered medications for this visit.   No current outpatient medications on file.   Facility-Administered Medications Ordered in Other Visits  Medication Dose Route Frequency Provider Last Rate Last Admin  . epoetin alfa-epbx (RETACRIT) injection 10,000 Units  10,000 Units Subcutaneous Once Derek Jack, MD        ALLERGIES:  Allergies  Allergen Reactions  . Latex Rash  . Levofloxacin Nausea And Vomiting  . Neomycin-Bacitracin-Polymyxin  [Bacitracin-Neomycin-Polymyxin] Rash  . Other Rash and Swelling  . Prednisone Hives, Itching, Rash and Other (See Comments)  . Fish Allergy     rash  . Neosporin [Neomycin-Polymyxin-Gramicidin] Itching and Rash    PHYSICAL EXAM:  Performance status (ECOG): 1 - Symptomatic but completely ambulatory  Vitals:   08/27/20 1048  BP: (!) 147/75  Pulse: 93  Resp: 16  Temp: (!) 96.8 F (36 C)  SpO2: 98%   Wt Readings from Last 3 Encounters:  08/16/20 92 lb 6.4 oz (41.9 kg)  07/16/20 92 lb 6.4 oz (41.9 kg)  07/11/20 96 lb 6.4 oz (43.7 kg)   Physical Exam Vitals reviewed.  Constitutional:      Appearance: Normal appearance.     Comments: In wheelchair  Cardiovascular:     Rate and Rhythm: Normal rate and regular rhythm.     Pulses: Normal pulses.     Heart sounds: Normal heart sounds.  Pulmonary:     Effort: Pulmonary effort is normal.     Breath sounds: Normal breath sounds.  Musculoskeletal:     Right lower leg: No edema.     Left lower leg: No edema.  Neurological:     General: No focal deficit present.     Mental Status: She is alert and oriented to person, place, and time.  Psychiatric:        Mood and Affect: Mood normal.        Behavior: Behavior normal.     LABORATORY DATA:  I have reviewed the labs as listed.  CBC Latest Ref Rng & Units 08/27/2020 08/13/2020 07/30/2020  WBC 4.0 - 10.5 K/uL 7.8 7.9 6.5  Hemoglobin 12.0 - 15.0 g/dL 11.0(L) 10.9(L) 10.3(L)  Hematocrit 36.0 -  46.0 % 35.0(L) 35.2(L) 32.8(L)  Platelets 150 - 400 K/uL 218 224 225   CMP Latest Ref Rng & Units 07/19/2020 07/18/2020 03/20/2020  Glucose 70 - 99 mg/dL 83 91 114(H)  BUN 8 - 23 mg/dL 20 19 18   Creatinine 0.44 - 1.00 mg/dL 1.04(H) 1.11(H) 1.11(H)  Sodium 135 - 145 mmol/L 136 138 139  Potassium 3.5 - 5.1 mmol/L 3.5 3.7 4.4  Chloride 98 - 111 mmol/L 100 101 102  CO2 22 - 32 mmol/L 29 27 27   Calcium 8.9 - 10.3 mg/dL 9.2 9.2 9.5  Total Protein 6.5 - 8.1 g/dL -  5.9(L) 6.6  Total Bilirubin 0.3 - 1.2 mg/dL - 0.6 0.8  Alkaline Phos 38 - 126 U/L - 37(L) 61  AST 15 - 41 U/L - 7(L) 9(L)  ALT 0 - 44 U/L - 8 6      Component Value Date/Time   RBC 3.70 (L) 08/27/2020 1007   MCV 94.6 08/27/2020 1007   MCH 29.7 08/27/2020 1007   MCHC 31.4 08/27/2020 1007   RDW 15.5 08/27/2020 1007   LYMPHSABS 3.0 04/03/2020 0843   MONOABS 0.6 04/03/2020 0843   EOSABS 0.4 04/03/2020 0843   BASOSABS 0.1 04/03/2020 0843    DIAGNOSTIC IMAGING:  I have independently reviewed the scans and discussed with the patient. No results found.   ASSESSMENT:  1.  Normocytic anemia: -Last colonoscopy was in June 2019 in [place], 3 polyps removed. -Etiology is from combination of CKD and relative iron deficiency. -Initial work-up included SPEP which was negative.  Stool was negative for occult blood.  Hemoglobin electrophoresis showed severe microcytosis related to her anemia.  She could have underlying thalassemia/sickle cell trait. -She has a history of needing a blood transfusion in the early 2000's. -We have taken over Epogen injections for Dr. Theador Hawthorne.  We have started her on 10,000 units every 2 weeks.   2.  CKD: -Ultrasound the kidneys on 08/22/2019 showed bilateral renal atrophy, severe atrophy of the right kidney. -Continue follow-up with Dr. Theador Hawthorne.  3.  R48 and folic acid deficiency: -She is on B12 injections monthly.   PLAN:  1.  Normocytic anemia: -Denies any bleeding per rectum or melena. -Her  hemoglobin today is 11.  Continue Retacrit 10,000 units every 2 weeks if hemoglobin below 11. -RTC 3 months for follow-up.  2.  CKD: -Last creatinine was 1.04.  3.  N46 and folic acid deficiency: -Continue B12 injections monthly.  Orders placed this encounter:  No orders of the defined types were placed in this encounter.    Derek Jack, MD [place] [phone]   I, Milinda Antis, am acting as a scribe for Dr. Sanda Linger.  I, Derek Jack MD, have reviewed the above documentation for accuracy and completeness, and I agree with the above.

## 2020-08-27 NOTE — Progress Notes (Signed)
Hgb 11 today for oncology visit and injection.  No retacrit today verbal order Dr. Delton Coombes.

## 2020-08-27 NOTE — Progress Notes (Signed)
Patient was assessed by Dr. Delton Coombes and labs have been reviewed.  NO retacrit injection today since labs are good.  Primary RN and pharmacy aware.

## 2020-08-27 NOTE — Patient Instructions (Signed)
[place] at [facility] Discharge Instructions  You were seen today by Dr. Delton Coombes. He went over your recent results. Given your blood results, you don't require your Retacrit injection today. Continue getting your blood checked every 2 weeks. Dr. Delton Coombes will see you back in 3 months for labs and follow up.   Thank you for choosing [place] at [facility] to provide your oncology and hematology care.  To afford each patient quality time with our provider, please arrive at least 15 minutes before your scheduled appointment time.   If you have a lab appointment with the [facility] please come in thru the Main Entrance and check in at the main information desk  You need to re-schedule your appointment should you arrive 10 or more minutes late.  We strive to give you quality time with our providers, and arriving late affects you and other patients whose appointments are after yours.  Also, if you no show three or more times for appointments you may be dismissed from the clinic at the providers discretion.     Again, thank you for choosing [facility].  Our hope is that these requests will decrease the amount of time that you wait before being seen by our physicians.       _____________________________________________________________  Should you have questions after your visit to [facility], please contact our office at [phone] between the hours of 8:00 a.m. and 4:30 p.m.  Voicemails left after 4:00 p.m. will not be returned until the following business day.  For prescription refill requests, have your pharmacy contact our office and allow 72 hours.    Cancer Center Support Programs:   > Cancer Support Group  2nd Tuesday of the month 1pm-2pm, Journey Room

## 2020-08-27 NOTE — Progress Notes (Signed)
Pt did not have any paperwork from the [facility] when she arrived.  I called to get the information faxed to [place] and we have not received a fax yet.  The pt does not think her medications have changed.

## 2020-09-05 ENCOUNTER — Other Ambulatory Visit (HOSPITAL_COMMUNITY)
Admission: RE | Admit: 2020-09-05 | Discharge: 2020-09-05 | Disposition: A | Discharge status: Home / Self Care | Payer: Medicare Other | Source: Skilled Nursing Facility | Attending: Adult Health | Admitting: Adult Health

## 2020-09-05 DIAGNOSIS — E039 Hypothyroidism, unspecified: Secondary | ICD-10-CM | POA: Diagnosis not present

## 2020-09-05 LAB — T4, FREE: Free T4: 0.96 ng/dL (ref 0.61–1.12)

## 2020-09-05 LAB — TSH: TSH: 2.291 u[IU]/mL (ref 0.350–4.500)

## 2020-09-06 LAB — T4: T4, Total: 5.4 ug/dL (ref 4.5–12.0)

## 2020-09-06 IMAGING — DX DG CHEST 1V PORT
1 series · 1 of 1 positions shown · non-contrast
Comparison: 12/18/2019

CLINICAL DATA: Shortness of breath

EXAM:
PORTABLE CHEST 1 VIEW

[chest ap]
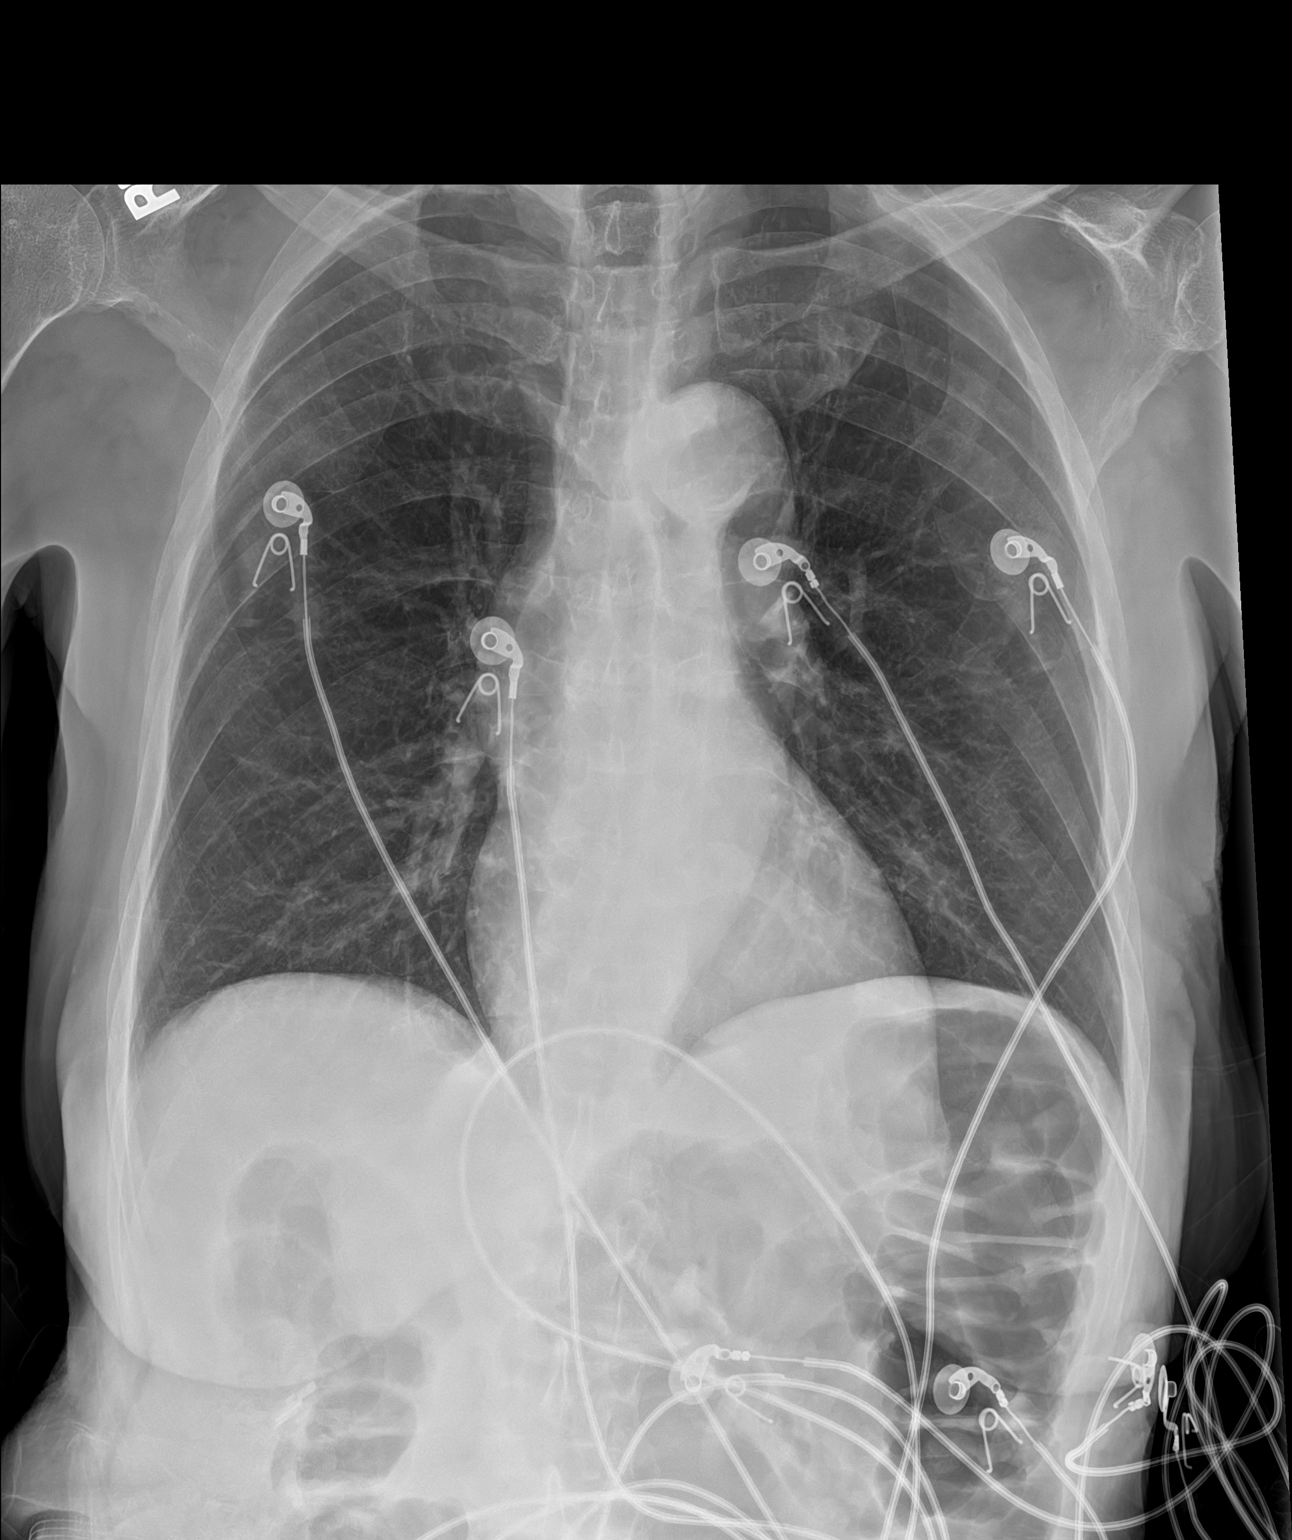

[1 of 1 positions shown; findings below may reference images not displayed]

FINDINGS: The heart size and mediastinal contours are within normal limits.
Both lungs are clear. The visualized skeletal structures are
unremarkable.
IMPRESSION: No active disease.

## 2020-09-06 IMAGING — CT CT ANGIO CHEST
2 of 6 series · 18 of 46 positions shown · IV contrast (Omnipaque or Isovue)
Comparison: Abdomen and pelvis CT December 15, 2019

CLINICAL DATA: Dyspnea on exertion, elevated D-dimer and shortness
of breath for 2 weeks.

EXAM:
CT ANGIOGRAPHY CHEST WITH CONTRAST
TECHNIQUE: Multidetector CT imaging of the chest was performed using the
standard protocol during bolus administration of intravenous
contrast. Multiplanar CT image reconstructions and MIPs were
obtained to evaluate the vascular anatomy.
CONTRAST:  60mL OMNIPAQUE IOHEXOL 350 MG/ML SOLN

[Series 7: pe axial thins · axial · 0.68mm/px · z∈[-344,-102]mm · 15 of 265 slices shown]
[im 12/265  lung]
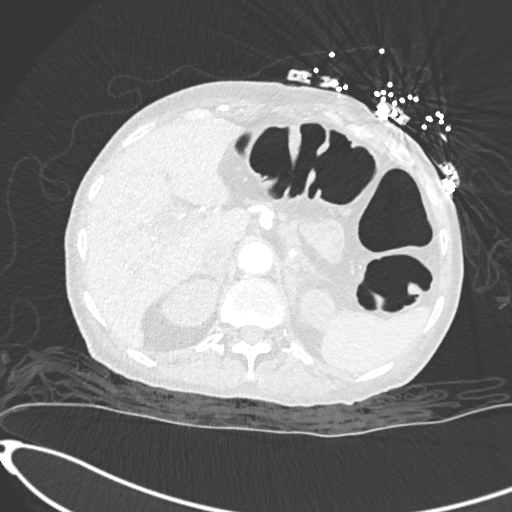
[im 35/265  soft-tissue]
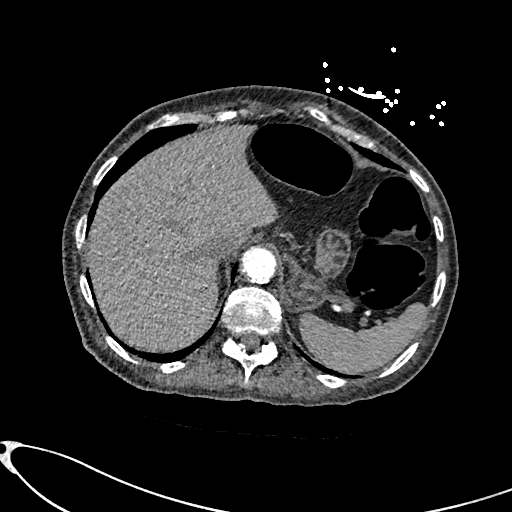
[im 46/265  lung]
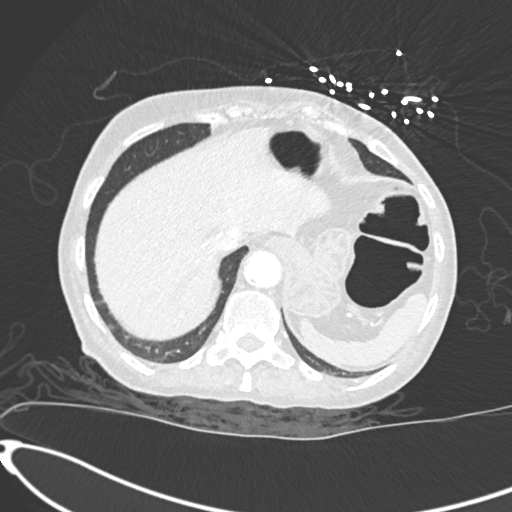
[im 69/265  soft-tissue]
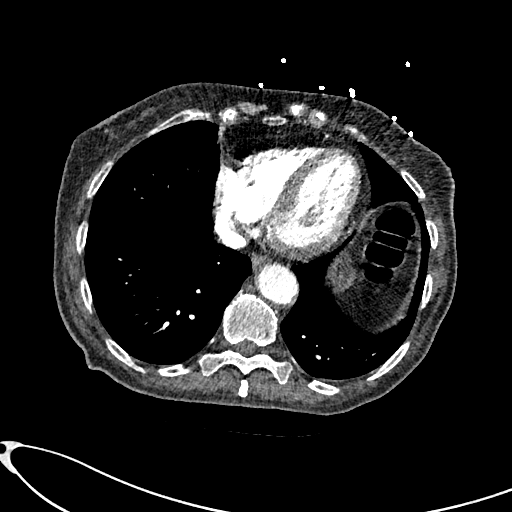
[im 81/265  lung]
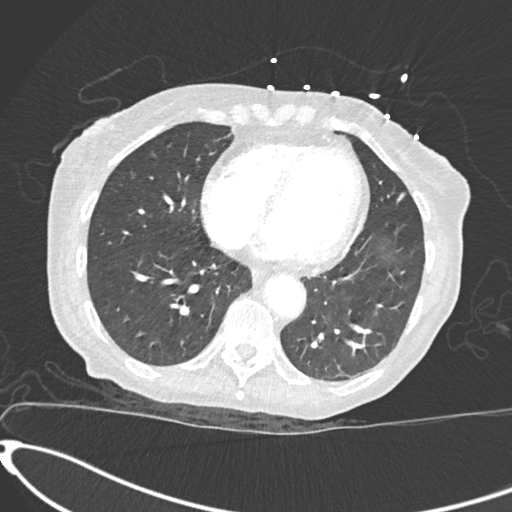
[im 104/265  soft-tissue]
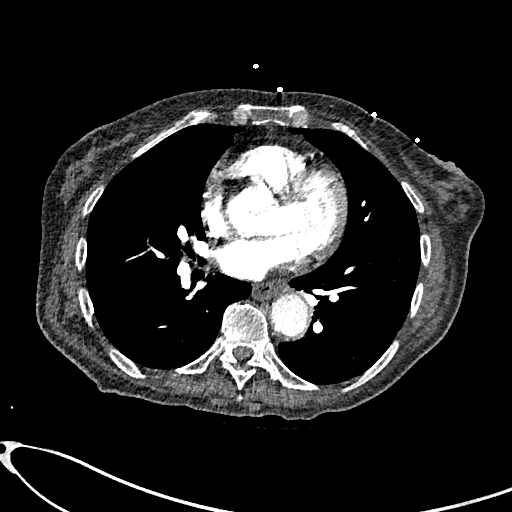
[im 115/265  lung]
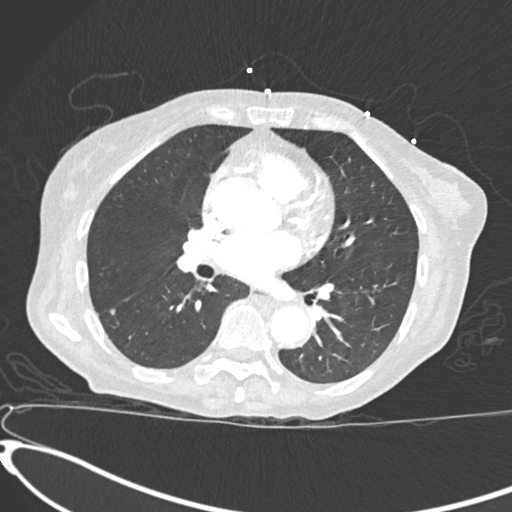
[im 138/265  soft-tissue]
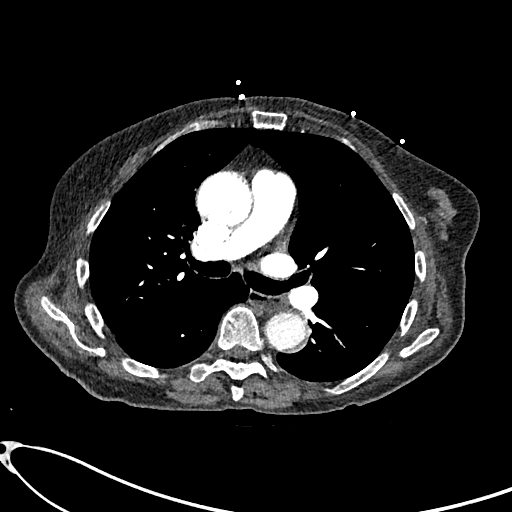
[im 150/265  lung]
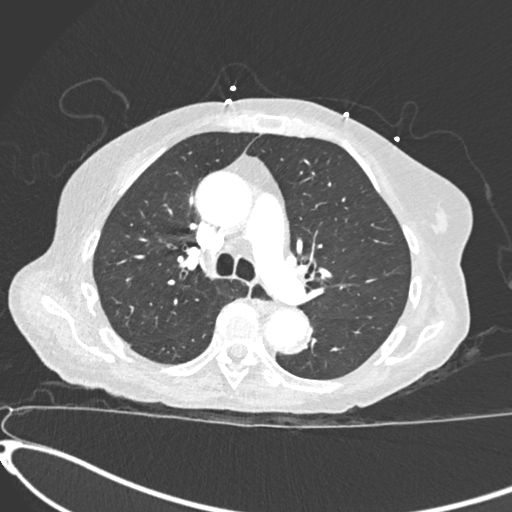
[im 161/265  soft-tissue]
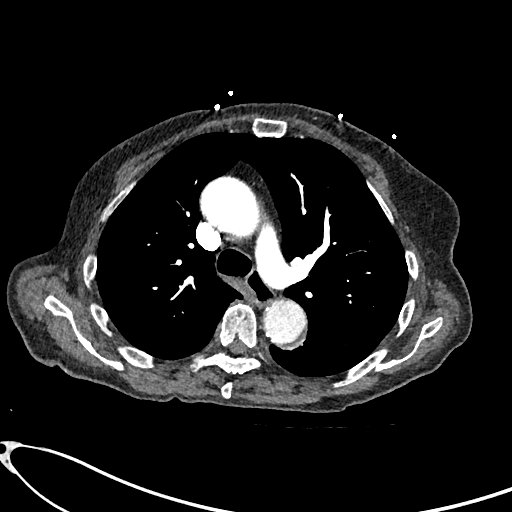
[im 184/265  lung]
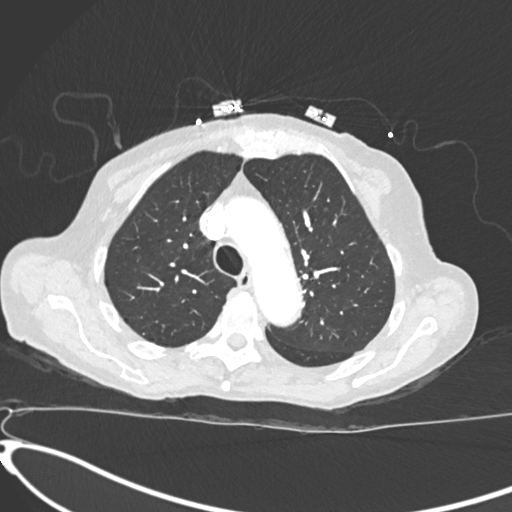
[im 196/265  soft-tissue]
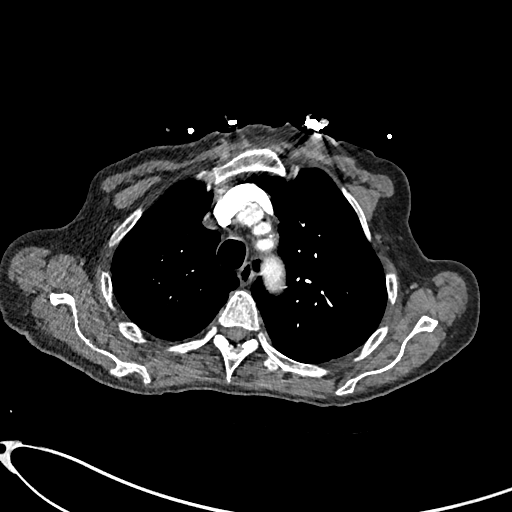
[im 219/265  lung]
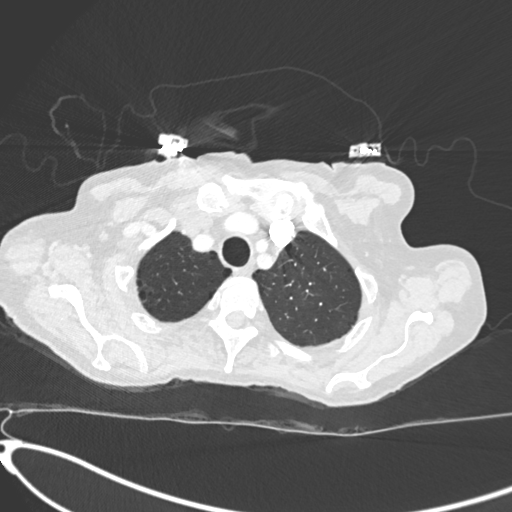
[im 230/265  soft-tissue]
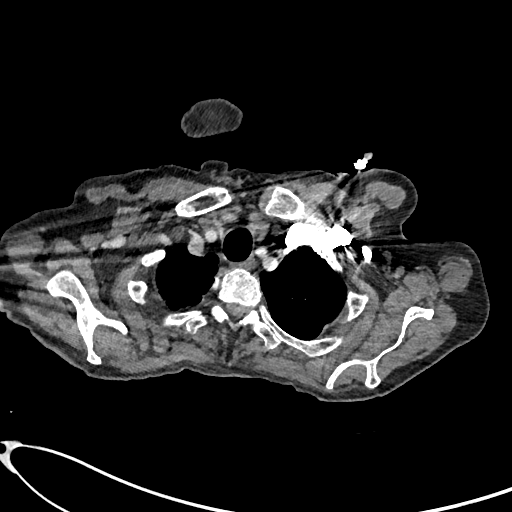
[im 253/265  lung]
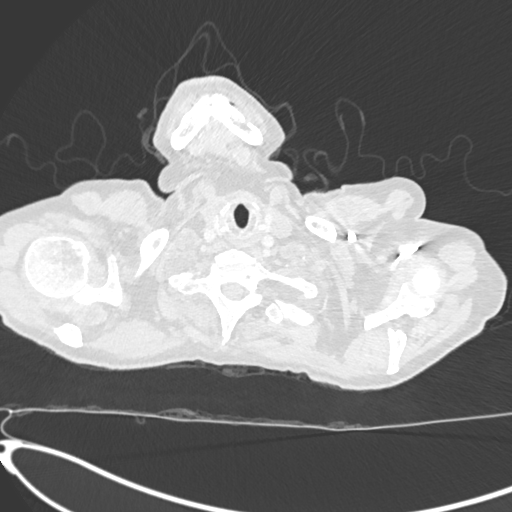

[Series 9: cor soft · coronal · 0.58mm/px · 3 of 123 slices shown]
[im 31/123  soft-tissue]
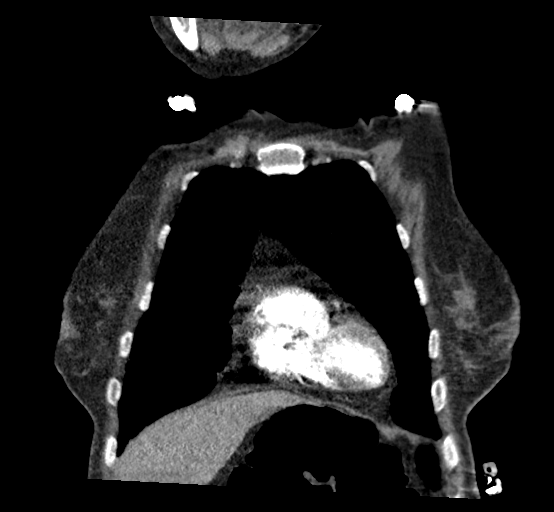
[im 62/123  soft-tissue]
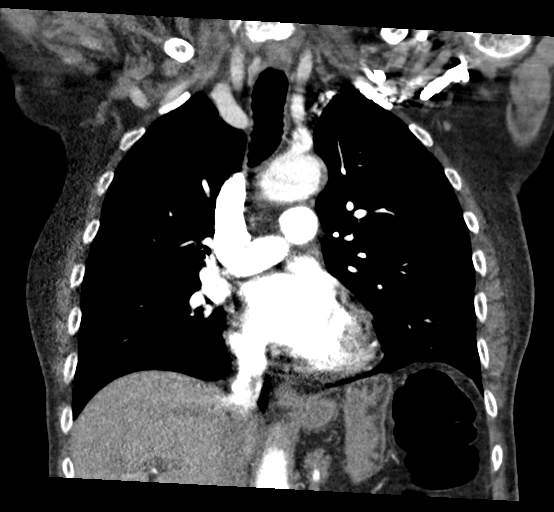
[im 92/123  soft-tissue]
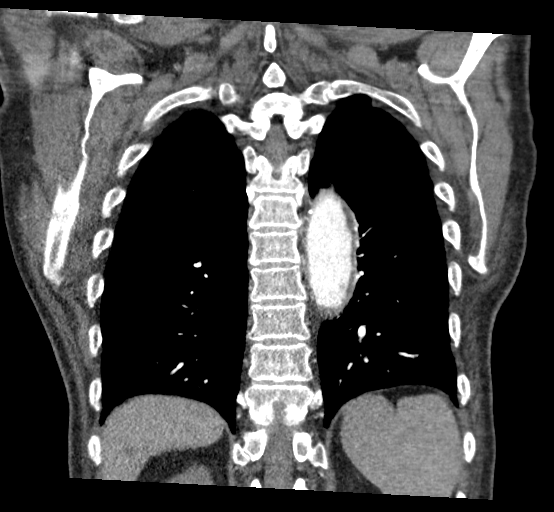

[18 of 46 positions shown; findings below may reference images not displayed]

FINDINGS: Cardiovascular: Calcific and noncalcific atheromatous plaque in the
thoracic aorta. Moderate and with 3.6 cm caliber of ascending
thoracic aorta. Heart size is normal without pericardial effusion.

Opacification of pulmonary vasculature without sign of pulmonary
embolism.

Mediastinum/Nodes: Thoracic inlet structures are normal. No axillary
lymphadenopathy. No mediastinal lymphadenopathy. No hilar
lymphadenopathy. Esophagus is mildly patulous.

Lungs/Pleura: Paraseptal emphysema, mild and worse at the lung
apices. No consolidation. No pleural effusion. Small nodule along
the fissure in the RIGHT lower lobe (image 78, series 8) 3 mm. No
consolidation. No pleural effusion.

Upper Abdomen: Incidental imaging of upper abdominal contents
without acute upper abdominal process. Limited visualization of
viscera in the upper abdomen.

Musculoskeletal: No acute musculoskeletal process.

Review of the MIP images confirms the above findings.
IMPRESSION: 1. No evidence of pulmonary embolism.
2. Paraseptal emphysema, mild and worse at the lung apices.
3. 3 mm nodule along the fissure in the RIGHT lower lobe. No
follow-up needed if patient is low-risk. Non-contrast chest CT can
be considered in 12 months if patient is high-risk. This
recommendation follows the consensus statement: Guidelines for
Management of Incidental Pulmonary Nodules Detected on CT Images:
4. Moderate and with 3.6 cm caliber of ascending thoracic aorta.
Recommend annual imaging followup by CTA or MRA. This recommendation
follows 7434 ACCF/AHA/AATS/ACR/ASA/SCA/NOKHANYA/VER/DIONDRE/WOERNER Guidelines
for the Diagnosis and Management of Patients with Thoracic Aortic
Disease. Circulation.7434; 121: E266-e369. Aortic aneurysm NOS
(R6KPR-GY6.Z)
5. Emphysema and aortic atherosclerosis.

Aortic Atherosclerosis (R6KPR-X42.2) and Emphysema (R6KPR-4EX.J).

## 2020-09-10 ENCOUNTER — Inpatient Hospital Stay (HOSPITAL_COMMUNITY): Payer: Medicare Other | Attending: Hematology

## 2020-09-10 ENCOUNTER — Inpatient Hospital Stay (HOSPITAL_COMMUNITY): Payer: Medicare Other

## 2020-09-10 VITALS — BP 124/59 | HR 83 | Temp 96.6°F | Resp 18

## 2020-09-10 DIAGNOSIS — N189 Chronic kidney disease, unspecified: Secondary | ICD-10-CM | POA: Diagnosis not present

## 2020-09-10 DIAGNOSIS — D631 Anemia in chronic kidney disease: Secondary | ICD-10-CM | POA: Insufficient documentation

## 2020-09-10 DIAGNOSIS — E860 Dehydration: Secondary | ICD-10-CM

## 2020-09-10 LAB — CBC
HCT: 33.2 % — ABNORMAL LOW (ref 36.0–46.0)
Hemoglobin: 10.2 g/dL — ABNORMAL LOW (ref 12.0–15.0)
MCH: 29 pg (ref 26.0–34.0)
MCHC: 30.7 g/dL (ref 30.0–36.0)
MCV: 94.3 fL (ref 80.0–100.0)
Platelets: 193 10*3/uL (ref 150–400)
RBC: 3.52 MIL/uL — ABNORMAL LOW (ref 3.87–5.11)
RDW: 14.9 % (ref 11.5–15.5)
WBC: 6.8 10*3/uL (ref 4.0–10.5)
nRBC: 0 % (ref 0.0–0.2)

## 2020-09-10 MED ORDER — EPOETIN ALFA-EPBX 10000 UNIT/ML IJ SOLN
10000.0000 [IU] | Freq: Once | INTRAMUSCULAR | Status: AC
  Administered 2020-09-10: 10000 [IU] via SUBCUTANEOUS
  Filled 2020-09-10: qty 1

## 2020-09-10 NOTE — Progress Notes (Signed)
..  Traci Parker presents today for injection per the provider's orders. Retacrit administration without incident; injection site WNL; see MAR for injection details.  Patient tolerated procedure well and without incident.  No questions or complaints noted at this time. Patient discharged via wheelchair in satisfactory condition.

## 2020-09-11 ENCOUNTER — Encounter: Payer: Self-pay | Admitting: Adult Health

## 2020-09-11 ENCOUNTER — Non-Acute Institutional Stay (SKILLED_NURSING_FACILITY): Payer: Medicare Other | Admitting: Adult Health

## 2020-09-11 DIAGNOSIS — N1832 Chronic kidney disease, stage 3b: Secondary | ICD-10-CM

## 2020-09-11 DIAGNOSIS — G959 Disease of spinal cord, unspecified: Secondary | ICD-10-CM

## 2020-09-11 DIAGNOSIS — D631 Anemia in chronic kidney disease: Secondary | ICD-10-CM

## 2020-09-11 DIAGNOSIS — I129 Hypertensive chronic kidney disease with stage 1 through stage 4 chronic kidney disease, or unspecified chronic kidney disease: Secondary | ICD-10-CM

## 2020-09-11 DIAGNOSIS — E1122 Type 2 diabetes mellitus with diabetic chronic kidney disease: Secondary | ICD-10-CM | POA: Diagnosis not present

## 2020-09-11 DIAGNOSIS — N183 Chronic kidney disease, stage 3 unspecified: Secondary | ICD-10-CM | POA: Diagnosis not present

## 2020-09-11 NOTE — Progress Notes (Signed)
Location:  [facility] Number: 409 Place of Service:  SNF (31)   CODE STATUS: dnr  Allergies  Allergen Reactions  . Latex Rash  . Levofloxacin Nausea And Vomiting  . Neomycin-Bacitracin-Polymyxin  [Bacitracin-Neomycin-Polymyxin] Rash  . Other Rash and Swelling  . Prednisone Hives, Itching, Rash and Other (See Comments)  . Fish Allergy     rash  . Neosporin [Neomycin-Polymyxin-Gramicidin] Itching and Rash    Chief Complaint  Patient presents with  . Medical Management of Chronic Issues           Hypertension associated with stage 3 chronic kidney disease due to type 2 diabetes mellitus   . Anemia due to chronic renal failure treated with erythropoietin stage 3. Increased intraocular pressure bilateral    HPI:  She is Traci Traci 75 year [place] long term resident of this facility is being seen for the management of her chronic illnesses;  Hypertension associated with stage 3 chronic kidney disease due to type 2 diabetes mellitus   . Anemia due to chronic renal failure treated with erythropoietin stage 3. Increased intraocular pressure bilateral.  She continues to have weakness present. No uncontrolled pain; no changes in appetite.    Past Medical History:  Diagnosis Date  . Allergy   . Anemia    pernicious anemia  . Asthma   . Blood transfusion without reported diagnosis   . CKD (chronic kidney disease)   . COPD (chronic obstructive pulmonary disease) ([place]) 12/23/2019  . Gastritis   . Glaucoma   . History of blood in urine   . Hyperlipidemia   . Hypertension   . Ocular hypertension   . Osteoporosis   . Type 2 diabetes mellitus (Traci [place]) 12/23/2019  . Vitamin B12 deficiency anemia due to intrinsic factor deficiency     Past Surgical History:  Procedure Laterality Date  . CHOLECYSTECTOMY  2008  . COLONOSCOPY  04/30/2011   Procedure: COLONOSCOPY;  Surgeon: Rogene Houston, MD;  Location: [facility];  Service: Endoscopy;  Laterality: N/Traci;  .  COLONOSCOPY  05/25/2012   Procedure: COLONOSCOPY;  Surgeon: Rogene Houston, MD;  Location: [facility];  Service: Endoscopy;  Laterality: N/Traci;  1:25-changed to 1200 Ann to notify pt  . COLONOSCOPY Bilateral 12/2017  . ESOPHAGOGASTRODUODENOSCOPY N/Traci 12/07/2019   Procedure: ESOPHAGOGASTRODUODENOSCOPY (EGD);  Surgeon: Rogene Houston, MD;  Location: [facility];  Service: Endoscopy;  Laterality: N/Traci;  155  . GALLBLADDER SURGERY    . right breast cystectomy  1988  . TUBAL LIGATION  1975    Social History   Socioeconomic History  . Marital status: Divorced    Spouse name: Not on file  . Number of children: 4  . Years of education: 9  . Highest education level: Not on file  Occupational History  . Occupation: retired   Tobacco Use  . Smoking status: Former Smoker    Packs/day: 1.00    Years: 40.00    Pack years: 40.00    Quit date: 04/03/2011    Years since quitting: 9.4  . Smokeless tobacco: Never Used  Vaping Use  . Vaping Use: Never used  Substance and Sexual Activity  . Alcohol use: No  . Drug use: No  . Sexual activity: Not Currently  Other Topics Concern  . Not on file  Social History Narrative   01/17/[birth year] lives at [facility], [place]   Social Determinants of Health   Financial Resource Strain: Not on file  Food Insecurity: Not on file  Transportation Needs: Not on file  Physical Activity: Not on file  Stress: Not on file  Social Connections: Not on file  Intimate Partner Violence: Not on file   Family History  Problem Relation Age of Onset  . Diabetes Mother   . Hyperlipidemia Mother   . Hypertension Mother   . Heart disease Father   . Hyperlipidemia Father   . Diabetes Sister   . Hyperlipidemia Sister   . Hypertension Sister   . Cancer Brother   . Heart disease Brother   . Hyperlipidemia Brother   . Hypertension Brother   . Diabetes Sister       VITAL SIGNS BP 116/69   Pulse 80   Temp 98.1 F (36.7 C)   Resp 18   Ht 5\' 4"  (1.626 m)    Wt 94 lb (42.6 kg)   BMI 16.14 kg/m   Facility-Administered Encounter Medications as of 09/11/2020  Medication  . epoetin alfa-epbx (RETACRIT) injection 10,000 Units   Outpatient Encounter Medications as of 09/11/2020  Medication Sig  . acetaminophen (TYLENOL) 325 MG tablet Take 2 tablets (650 mg total) by mouth every 6 (six) hours as needed for mild pain (or Fever >/= 101).  Marland Kitchen albuterol (VENTOLIN HFA) 108 (90 Base) MCG/ACT inhaler Inhale 2 puffs into the lungs every 4 (four) hours as needed for wheezing or shortness of breath.  Marland Kitchen amLODipine (NORVASC) 10 MG tablet Take 1 tablet (10 mg total) by mouth daily.  Marland Kitchen apixaban (ELIQUIS) 5 MG TABS tablet Take 1 tablet (5 mg total) by mouth 2 (two) times daily.  Roseanne Kaufman Peru-Castor Oil (VENELEX) OINT Apply topically 3 (three) times daily. Special Instructions: Apply to sacrum and bilateral buttocks qshift for prevention.  . cyanocobalamin (,VITAMIN B-12,) 1000 MCG/ML injection Inject 1 mL (1,000 mcg total) into the muscle every 30 (thirty) days.  . dorzolamide (TRUSOPT) 2 % ophthalmic solution Place 1 drop into both eyes 2 (two) times daily.  . Ensure (ENSURE) Take 237 mLs by mouth 3 (three) times daily between meals.  . folic acid (FOLVITE) 1 MG tablet Take 1 mg by mouth daily.   Marland Kitchen gabapentin (NEURONTIN) 100 MG capsule Take 100 mg by mouth at bedtime.  Marland Kitchen latanoprost (XALATAN) 0.005 % ophthalmic solution Place 1 drop into both eyes at bedtime.  . metoCLOPramide (REGLAN) 5 MG tablet Take 1 tablet (5 mg total) by mouth 3 (three) times daily before meals.  . NON FORMULARY Mechanical soft diet ALLERGIC TO FISH  . omeprazole (PRILOSEC) 40 MG capsule Take 40 mg by mouth daily.   . ondansetron (ZOFRAN ODT) 4 MG disintegrating tablet Take 1 tablet (4 mg total) by mouth every 8 (eight) hours as needed for nausea or vomiting. Dissolve under tongue     SIGNIFICANT DIAGNOSTIC EXAMS   PREVIOUS   12-23-19: ct angio of chest:  1. No evidence of pulmonary  embolism. 2. Paraseptal emphysema, mild and worse at the lung apices. 3. 3 mm nodule along the fissure in the RIGHT lower lobe. . 4. Moderate and with 3.6 cm caliber of ascending thoracic aorta.. Aortic aneurysm  5. Emphysema and aortic atherosclerosis  02-02-20: bilateral lower extremity venous ultrasound:  Extensive acute appearing left femoropopliteal occlusive DVT extending into the calf veins. Negative for right lower extremity DVT  02-05-20: MRI cervical spine:  1. Widespread cervical spine degeneration. No acute osseous abnormality. 2. Multilevel mild to moderate spinal stenosis and spinal cord mass effect at C3-C4 through C6-C7, with  possible spinal cord myelomalacia at the C6 level. No other cord signal abnormality. 3. Moderate or severe degenerative neural foraminal stenosis at the bilateral C5 and C6 nerve levels.   02-05-20: MRI thoracic spine:  Normal for age MRI appearance of the thoracic spine   02-06-20: ct of chest abdomen and pelvis:  1. No definite evidence of malignancy in the chest, abdomen, or pelvis. 2. Unchanged small bilateral lung nodules measuring up to 3 mm. Non-contrast chest CT can be considered in 12 months.  3. Large stool burden with rectal distension and presacral inflammation which may reflect stercoral proctitis. 4. Known left lower extremity DVT. 5. Nonobstructing nephrolithiasis. 6. Aortic Atherosclerosis and Emphysema .  NO NEW EXAMS.     LABS REVIEWED PREVIOUS   12-23-19: wbc 8.2; hgb 7.3; hct 25.2; mcv 58.3 plt 220; glucose 114; bun 15; creat 1.46; k+ 3.2; na++ 137 ca 9.4 liver normal albumin 4.1 d-dimer 0.86; mag 1.5; phos 3.3; urine culture: e-coli 12-25-19: wbc 8.2; hgb 7.1; hct 23.5; mcv 57.6 plt 215; glucose 111; bun 8; creat 1.08; k+ 4.2; na++ 135 ca 8.8; mag 1.6 12-26-19: wbc 7.9; hgb 9.5; hct 30.0 mcv 63.6 plt 205; glucose 110; bun 13; creat 1.21; k+ 4.1; na++ 136; ca 9.2 phos 3.4 albumin 3.6  01-01-20: wbc 10.8; hgb 9.9; hct 32.5; mcv 65.4  plt 282; glucose 99; bun 21; creat 1.41; k+ 3.9; na++ 133;ca 9.1 mag 1.9 free t4: 1.37 free t3: 2.1 tsh 7.150 01-08-20: ANA neg; CRP 15.6 01-09-20: wbc 8.2; hgb 8.2; hct 25.9; mcv 66.6 plt 238;  01-10-20: wbc 10.2; hgb 8.9; hct 28.2; mcv 67.6 plt 290; gucose 117; bun 25; creat 1.59; k+ 4.2; na++ 138; ca 9.2; live normal albumin 3.4 mag 1.9 RPR neg 02-01-20: wbc 10.6; hgb 28.1; mcv 74.1 plt 271; glucose 140 bun 27; creat 1.28; k+ 4.4; an++ 120; ca 8.9 liver normal albumin 3.4 mag 1.9 02-04-20: wbc 4.7; hgb 9.2; hct 28.7; mcv 78.2 plt 273 02-05-20: wbc 5.0; hgb 9.4; hct 29.2; mcv 78.3 plt 270; glucose 95; bun 19; creat 0.90; k+ 3.5; na++ 135; ca 8.6 liver normal albumin 2.6 02-08-20; glucose 85; bun 13; creat 0.91; k+ 3.6; na++ 134; ca 8.6 02-14-20: tsh 6.564 free T3: 2.2 free T4: 1.80 02-16-20: wbc 5.9; hgb 9.6; hct 30.3; mcv 81.9 plt 312; glucose 86; bun 15; creat 1.07; k+ 3.5; na++ 137; ca 8.4  03-01-20: wbc 7.3; hgb 10.1; hct 31.8; mcv 86.6 plt 195; glucose 100; bun 16; creat 1.08 ;k+ 3.5; na++ 137; ca 8.8  03-06-20; wbc 8.4; hgb 11.0; hct 35.7; mcv 87.1 plt 205; glucose 112; bun 17; creat 1.18; k+ 3.8; na++ 134; ca 9.2 liver normal albumin 3.5; ferritin 966; iron 24; tibc 158 vit B 12: 1294; vit D 41.02 03-20-20; wbc 10.9; hgb 10.5; hct 34.3; mcv 89.8 plt 293; glucose 114; bun 18; creat 1.11; k+ 4.4; na++ 139; ca 9.5 liver normal albumin 3.4 vit B 12: 1376; vit D 45.43  04-03-20: wbc 8.7; hgb 10.5; hct 34.3; mcv 91.7 plt 324 04-17-20: wbc 7.6; hgb 10.4; hct 32.9; mcv 92.2 plt 270 05-02-20: wbc 7.2; hgb 10.3; hct 33.0; mcv 94.3 plt 243 05-16-20: wbc 7.1; hgb 11.3; hct 35.5; mcv 92.9 plt 255 05-20-20: hgb a1c 4.8; hcol 197; ldl 130; trig 70 hdl 53 urine micro-albumin 15.8 05-30-20: wbc 7.5; hgb 10.8; hct 34.3; mcv 92.5 plt 248    NO NEW LABS.   Review of Systems  Constitutional: Negative for malaise/fatigue.  Respiratory: Negative  for cough and shortness of breath.   Cardiovascular: Negative for chest pain,  palpitations and leg swelling.  Gastrointestinal: Negative for abdominal pain, constipation and heartburn.  Musculoskeletal: Negative for back pain, joint pain and myalgias.  Skin: Negative.   Neurological: Negative for dizziness.  Psychiatric/Behavioral: The patient is not nervous/anxious.     Physical Exam Constitutional:      General: She is not in acute distress.    Appearance: She is underweight and well-nourished. She is not diaphoretic.  Neck:     Thyroid: No thyromegaly.  Cardiovascular:     Rate and Rhythm: Normal rate and regular rhythm.     Pulses: Normal pulses and intact distal pulses.     Heart sounds: Normal heart sounds.  Pulmonary:     Effort: Pulmonary effort is normal. No respiratory distress.     Breath sounds: Normal breath sounds.  Abdominal:     General: Bowel sounds are normal. There is no distension.     Palpations: Abdomen is soft.     Tenderness: There is no abdominal tenderness.  Musculoskeletal:        General: No edema.     Cervical back: Neck supple.     Right lower leg: No edema.     Left lower leg: No edema.     Comments:  Is able to move all extremities  Has poor fine motor abilities       Has generalized weakness present   Lymphadenopathy:     Cervical: No cervical adenopathy.  Skin:    General: Skin is warm and dry.  Neurological:     Mental Status: She is alert and oriented to person, place, and time.  Psychiatric:        Mood and Affect: Mood and affect and mood normal.     ASSESSMENT/ PLAN:  TODAY  1. Hypertension associated with stage 3 chronic kidney disease due to type 2 diabetes mellitus is stable b/p 116/69. Will continue norvasc 10 mg daily   2. Anemia due to chronic renal failure treated with erythropoietin stage 3; is stable ghb 10.8; will continue to monitor is treated per oncology  3. Increased intraocular pressure bilateral: is stable will continue trusopt to both eyes twice daily and xalatan to eyes nightly     PREVIOUS   4. Vitamin B 12 deficiency is stable will continue monthly b 12 injections and folic acid 1 mg daily   5. Failure to thrive in adult is without change is off megace due to dvt. Completed remeron therapy; weight 92 pounds.   6. Aortic atherosclerosis is stable (CT 12-23-19)  will monitor   7. Acute deep vein thrombosis (DVT) of proximal vein of left lower extremity: diagnosed 02-02-20: will continue eliquis 5 mg twice daily   8. Chronic obstructive pulmonary disease unspecified COPD type: is stable will continue albuterol 2 puffs every 4 hours as needed.   9. Peripheral neuropathy: is stable will continue gabapentin 100 mg nightly   10 .CKD stage 3 due to diabetes mellitus type 2 is stable bun 18 creat 1.11  11. Type 2 diabetes mellitus with stage 3 chronic kidney disease without long term current use of insulin: is stable hgb a1c 4.8 will monitor   12. Other chronic gastritis without hemorrhage is stable will continue prilosec 40 mg daily and reglan 5 mg prior to melas.     Ok Edwards NP [facility]  Contact [phone] Monday through Friday 8am- 5pm  After hours call [phone]

## 2020-09-24 ENCOUNTER — Inpatient Hospital Stay (HOSPITAL_COMMUNITY): Payer: Medicare Other

## 2020-09-24 ENCOUNTER — Encounter (HOSPITAL_COMMUNITY): Payer: Self-pay

## 2020-09-24 VITALS — BP 126/78 | HR 86 | Temp 97.0°F | Resp 18

## 2020-09-24 DIAGNOSIS — D631 Anemia in chronic kidney disease: Secondary | ICD-10-CM | POA: Diagnosis not present

## 2020-09-24 DIAGNOSIS — N183 Chronic kidney disease, stage 3 unspecified: Secondary | ICD-10-CM

## 2020-09-24 DIAGNOSIS — E860 Dehydration: Secondary | ICD-10-CM

## 2020-09-24 DIAGNOSIS — N189 Chronic kidney disease, unspecified: Secondary | ICD-10-CM | POA: Diagnosis not present

## 2020-09-24 LAB — CBC
HCT: 31.5 % — ABNORMAL LOW (ref 36.0–46.0)
Hemoglobin: 9.8 g/dL — ABNORMAL LOW (ref 12.0–15.0)
MCH: 29.7 pg (ref 26.0–34.0)
MCHC: 31.1 g/dL (ref 30.0–36.0)
MCV: 95.5 fL (ref 80.0–100.0)
Platelets: 194 10*3/uL (ref 150–400)
RBC: 3.3 MIL/uL — ABNORMAL LOW (ref 3.87–5.11)
RDW: 14.9 % (ref 11.5–15.5)
WBC: 5.7 10*3/uL (ref 4.0–10.5)
nRBC: 0 % (ref 0.0–0.2)

## 2020-09-24 MED ORDER — EPOETIN ALFA-EPBX 10000 UNIT/ML IJ SOLN
10000.0000 [IU] | Freq: Once | INTRAMUSCULAR | Status: AC
  Administered 2020-09-24: 10000 [IU] via SUBCUTANEOUS
  Filled 2020-09-24: qty 1

## 2020-09-24 NOTE — Patient Instructions (Signed)
[facility]  Discharge Instructions:   _______________________________________________________________  Thank you for choosing [facility] at [facility] to provide your oncology and hematology care.  To afford each patient quality time with our providers, please arrive at least 15 minutes before your scheduled appointment.  You need to re-schedule your appointment if you arrive 10 or more minutes late.  We strive to give you quality time with our providers, and arriving late affects you and other patients whose appointments are after yours.  Also, if you no show three or more times for appointments you may be dismissed from the clinic.  Again, thank you for choosing [facility] at [facility]. Our hope is that these requests will allow you access to exceptional care and in a timely manner. _______________________________________________________________  If you have questions after your visit, please contact our office at [phone] between the hours of 8:30 a.m. and 5:00 p.m. Voicemails left after 4:30 p.m. will not be returned until the following business day. _______________________________________________________________  For prescription refill requests, have your pharmacy contact our office. _______________________________________________________________  Recommendations made by the consultant and any test results will be sent to your referring physician. _______________________________________________________________ 

## 2020-09-24 NOTE — Progress Notes (Signed)
Patient presents today for Retacrit injection. Hemoglobin reviewed prior to administration. VSS. Injection tolerated without incident or complaint. See MAR for details. Patient stable during and after injection.  Patient discharged in satisfactory condition with follow up instructions. 

## 2020-10-03 ENCOUNTER — Encounter: Payer: Self-pay | Admitting: Adult Health

## 2020-10-03 ENCOUNTER — Non-Acute Institutional Stay (SKILLED_NURSING_FACILITY): Payer: Medicare Other | Admitting: Adult Health

## 2020-10-03 DIAGNOSIS — N183 Chronic kidney disease, stage 3 unspecified: Secondary | ICD-10-CM

## 2020-10-03 DIAGNOSIS — I700 Atherosclerosis of aorta: Secondary | ICD-10-CM | POA: Diagnosis not present

## 2020-10-03 DIAGNOSIS — E1122 Type 2 diabetes mellitus with diabetic chronic kidney disease: Secondary | ICD-10-CM | POA: Diagnosis not present

## 2020-10-03 DIAGNOSIS — G959 Disease of spinal cord, unspecified: Secondary | ICD-10-CM | POA: Diagnosis not present

## 2020-10-03 NOTE — Progress Notes (Signed)
Location:  [facility] Room Number: 169 Place of Service:  SNF (31)   CODE STATUS:   Allergies  Allergen Reactions  . Latex Rash  . Levofloxacin Nausea And Vomiting  . Neomycin-Bacitracin-Polymyxin  [Bacitracin-Neomycin-Polymyxin] Rash  . Other Rash and Swelling  . Prednisone Hives, Itching, Rash and Other (See Comments)  . Fish Allergy     rash  . Neosporin [Neomycin-Polymyxin-Gramicidin] Itching and Rash    Chief Complaint  Patient presents with  . Acute Visit    Care plan meeting.     HPI:  We have come together for her care plan meeting. BIMS 15/15 mood 0/30. Family present  she is nonambulatory she is able to feed herself. She requires extensive assist with her adls. She is incontinent of bladder and bowel. There are no reports of falls. She does continue to wear TED hose daily; and prevelon heel protectors while in bed.  Therapy none at this time.  Weight soft diet; good appetite; has gained 3 pounds this past month with current weight at 97 pounds (positive gain).  She attends daily activities.  She will continue to be followed for her chronic illnesses including:  Aortic atherosclerosis CKD stage 3 due to type 2 diabetes mellitus Cervical myelopathy   Past Medical History:  Diagnosis Date  . Allergy   . Anemia    pernicious anemia  . Asthma   . Blood transfusion without reported diagnosis   . CKD (chronic kidney disease)   . COPD (chronic obstructive pulmonary disease) ([place]) 12/23/2019  . Gastritis   . Glaucoma   . History of blood in urine   . Hyperlipidemia   . Hypertension   . Ocular hypertension   . Osteoporosis   . Type 2 diabetes mellitus ([place]) 12/23/2019  . Vitamin B12 deficiency anemia due to intrinsic factor deficiency     Past Surgical History:  Procedure Laterality Date  . CHOLECYSTECTOMY  2008  . COLONOSCOPY  04/30/2011   Procedure: COLONOSCOPY;  Surgeon: Rogene Houston, MD;  Location: [facility];  Service: Endoscopy;   Laterality: N/A;  . COLONOSCOPY  05/25/2012   Procedure: COLONOSCOPY;  Surgeon: Rogene Houston, MD;  Location: [facility];  Service: Endoscopy;  Laterality: N/A;  1:25-changed to 1200 Ann to notify pt  . COLONOSCOPY Bilateral 12/2017  . ESOPHAGOGASTRODUODENOSCOPY N/A 12/07/2019   Procedure: ESOPHAGOGASTRODUODENOSCOPY (EGD);  Surgeon: Rogene Houston, MD;  Location: [facility];  Service: Endoscopy;  Laterality: N/A;  155  . GALLBLADDER SURGERY    . right breast cystectomy  1988  . TUBAL LIGATION  1975    Social History   Socioeconomic History  . Marital status: Divorced    Spouse name: Not on file  . Number of children: 4  . Years of education: 9  . Highest education level: Not on file  Occupational History  . Occupation: retired   Tobacco Use  . Smoking status: Former Smoker    Packs/day: 1.00    Years: 40.00    Pack years: 40.00    Quit date: 04/03/2011    Years since quitting: 9.5  . Smokeless tobacco: Never Used  Vaping Use  . Vaping Use: Never used  Substance and Sexual Activity  . Alcohol use: No  . Drug use: No  . Sexual activity: Not Currently  Other Topics Concern  . Not on file  Social History Narrative   01/17/20 lives at [facility], [place]   Social Determinants of Health  Financial Resource Strain: Not on file  Food Insecurity: Not on file  Transportation Needs: Not on file  Physical Activity: Not on file  Stress: Not on file  Social Connections: Not on file  Intimate Partner Violence: Not on file   Family History  Problem Relation Age of Onset  . Diabetes Mother   . Hyperlipidemia Mother   . Hypertension Mother   . Heart disease Father   . Hyperlipidemia Father   . Diabetes Sister   . Hyperlipidemia Sister   . Hypertension Sister   . Cancer Brother   . Heart disease Brother   . Hyperlipidemia Brother   . Hypertension Brother   . Diabetes Sister       VITAL SIGNS BP 130/64   Pulse 78   Temp 98.1 F (36.7 C)   Resp 18    Ht 5\' 4"  (1.626 m)   Wt 97 lb (44 kg)   SpO2 93%   BMI 16.65 kg/m   Facility-Administered Encounter Medications as of 10/03/2020  Medication  . epoetin alfa-epbx (RETACRIT) injection 10,000 Units   Outpatient Encounter Medications as of 10/03/2020  Medication Sig  . acetaminophen (TYLENOL) 325 MG tablet Take 2 tablets (650 mg total) by mouth every 6 (six) hours as needed for mild pain (or Fever >/= 101).  Marland Kitchen albuterol (VENTOLIN HFA) 108 (90 Base) MCG/ACT inhaler Inhale 2 puffs into the lungs every 4 (four) hours as needed for wheezing or shortness of breath.  Marland Kitchen amLODipine (NORVASC) 10 MG tablet Take 1 tablet (10 mg total) by mouth daily.  Marland Kitchen apixaban (ELIQUIS) 5 MG TABS tablet Take 1 tablet (5 mg total) by mouth 2 (two) times daily.  Roseanne Kaufman [place]-Castor Oil (VENELEX) OINT Apply topically 3 (three) times daily. Special Instructions: Apply to sacrum and bilateral buttocks qshift for prevention.  . cyanocobalamin (,VITAMIN B-12,) 1000 MCG/ML injection Inject 1 mL (1,000 mcg total) into the muscle every 30 (thirty) days.  . dorzolamide (TRUSOPT) 2 % ophthalmic solution Place 1 drop into both eyes 2 (two) times daily.  . Ensure (ENSURE) Take 237 mLs by mouth 3 (three) times daily between meals.  . folic acid (FOLVITE) 1 MG tablet Take 1 mg by mouth daily.   Marland Kitchen gabapentin (NEURONTIN) 100 MG capsule Take 100 mg by mouth at bedtime.  Marland Kitchen latanoprost (XALATAN) 0.005 % ophthalmic solution Place 1 drop into both eyes at bedtime.  . metoCLOPramide (REGLAN) 5 MG tablet Take 1 tablet (5 mg total) by mouth 3 (three) times daily before meals.  . NON FORMULARY Mechanical soft diet ALLERGIC TO FISH  . omeprazole (PRILOSEC) 40 MG capsule Take 40 mg by mouth daily.   . ondansetron (ZOFRAN ODT) 4 MG disintegrating tablet Take 1 tablet (4 mg total) by mouth every 8 (eight) hours as needed for nausea or vomiting. Dissolve under tongue     SIGNIFICANT DIAGNOSTIC EXAMS   PREVIOUS   12-23-19: ct angio of chest:   1. No evidence of pulmonary embolism. 2. Paraseptal emphysema, mild and worse at the lung apices. 3. 3 mm nodule along the fissure in the RIGHT lower lobe. . 4. Moderate and with 3.6 cm caliber of ascending thoracic aorta.. Aortic aneurysm  5. Emphysema and aortic atherosclerosis  02-02-20: bilateral lower extremity venous ultrasound:  Extensive acute appearing left femoropopliteal occlusive DVT extending into the calf veins. Negative for right lower extremity DVT  02-05-20: MRI cervical spine:  1. Widespread cervical spine degeneration. No acute osseous abnormality. 2. Multilevel mild to moderate spinal  stenosis and spinal cord mass effect at C3-C4 through C6-C7, with possible spinal cord myelomalacia at the C6 level. No other cord signal abnormality. 3. Moderate or severe degenerative neural foraminal stenosis at the bilateral C5 and C6 nerve levels.   02-05-20: MRI thoracic spine:  Normal for age MRI appearance of the thoracic spine   02-06-20: ct of chest abdomen and pelvis:  1. No definite evidence of malignancy in the chest, abdomen, or pelvis. 2. Unchanged small bilateral lung nodules measuring up to 3 mm. Non-contrast chest CT can be considered in 12 months.  3. Large stool burden with rectal distension and presacral inflammation which may reflect stercoral proctitis. 4. Known left lower extremity DVT. 5. Nonobstructing nephrolithiasis. 6. Aortic Atherosclerosis and Emphysema .  NO NEW EXAMS.     LABS REVIEWED PREVIOUS   12-23-19: wbc 8.2; hgb 7.3; hct 25.2; mcv 58.3 plt 220; glucose 114; bun 15; creat 1.46; k+ 3.2; na++ 137 ca 9.4 liver normal albumin 4.1 d-dimer 0.86; mag 1.5; phos 3.3; urine culture: e-coli 12-25-19: wbc 8.2; hgb 7.1; hct 23.5; mcv 57.6 plt 215; glucose 111; bun 8; creat 1.08; k+ 4.2; na++ 135 ca 8.8; mag 1.6 12-26-19: wbc 7.9; hgb 9.5; hct 30.0 mcv 63.6 plt 205; glucose 110; bun 13; creat 1.21; k+ 4.1; na++ 136; ca 9.2 phos 3.4 albumin 3.6  01-01-20: wbc  10.8; hgb 9.9; hct 32.5; mcv 65.4 plt 282; glucose 99; bun 21; creat 1.41; k+ 3.9; na++ 133;ca 9.1 mag 1.9 free t4: 1.37 free t3: 2.1 tsh 7.150 01-08-20: ANA neg; CRP 15.6 01-09-20: wbc 8.2; hgb 8.2; hct 25.9; mcv 66.6 plt 238;  01-10-20: wbc 10.2; hgb 8.9; hct 28.2; mcv 67.6 plt 290; gucose 117; bun 25; creat 1.59; k+ 4.2; na++ 138; ca 9.2; live normal albumin 3.4 mag 1.9 RPR neg 02-01-20: wbc 10.6; hgb 28.1; mcv 74.1 plt 271; glucose 140 bun 27; creat 1.28; k+ 4.4; an++ 120; ca 8.9 liver normal albumin 3.4 mag 1.9 02-04-20: wbc 4.7; hgb 9.2; hct 28.7; mcv 78.2 plt 273 02-05-20: wbc 5.0; hgb 9.4; hct 29.2; mcv 78.3 plt 270; glucose 95; bun 19; creat 0.90; k+ 3.5; na++ 135; ca 8.6 liver normal albumin 2.6 02-08-20; glucose 85; bun 13; creat 0.91; k+ 3.6; na++ 134; ca 8.6 02-14-20: tsh 6.564 free T3: 2.2 free T4: 1.80 02-16-20: wbc 5.9; hgb 9.6; hct 30.3; mcv 81.9 plt 312; glucose 86; bun 15; creat 1.07; k+ 3.5; na++ 137; ca 8.4  03-01-20: wbc 7.3; hgb 10.1; hct 31.8; mcv 86.6 plt 195; glucose 100; bun 16; creat 1.08 ;k+ 3.5; na++ 137; ca 8.8  03-06-20; wbc 8.4; hgb 11.0; hct 35.7; mcv 87.1 plt 205; glucose 112; bun 17; creat 1.18; k+ 3.8; na++ 134; ca 9.2 liver normal albumin 3.5; ferritin 966; iron 24; tibc 158 vit B 12: 1294; vit D 41.02 03-20-20; wbc 10.9; hgb 10.5; hct 34.3; mcv 89.8 plt 293; glucose 114; bun 18; creat 1.11; k+ 4.4; na++ 139; ca 9.5 liver normal albumin 3.4 vit B 12: 1376; vit D 45.43  04-03-20: wbc 8.7; hgb 10.5; hct 34.3; mcv 91.7 plt 324 04-17-20: wbc 7.6; hgb 10.4; hct 32.9; mcv 92.2 plt 270 05-02-20: wbc 7.2; hgb 10.3; hct 33.0; mcv 94.3 plt 243 05-16-20: wbc 7.1; hgb 11.3; hct 35.5; mcv 92.9 plt 255 05-20-20: hgb a1c 4.8; hcol 197; ldl 130; trig 70 hdl 53 urine micro-albumin 15.8 05-30-20: wbc 7.5; hgb 10.8; hct 34.3; mcv 92.5 plt 248    NO NEW LABS.  Review of Systems  Constitutional: Negative for malaise/fatigue.  Respiratory: Negative for cough and shortness of breath.    Cardiovascular: Negative for chest pain, palpitations and leg swelling.  Gastrointestinal: Negative for abdominal pain, constipation and heartburn.  Musculoskeletal: Negative for back pain, joint pain and myalgias.  Skin: Negative.   Neurological: Negative for dizziness.  Psychiatric/Behavioral: The patient is not nervous/anxious.     Physical Exam Constitutional:      General: She is not in acute distress.    Appearance: She is underweight. She is not diaphoretic.  Neck:     Thyroid: No thyromegaly.  Cardiovascular:     Rate and Rhythm: Normal rate and regular rhythm.     Pulses: Normal pulses.     Heart sounds: Normal heart sounds.  Pulmonary:     Effort: Pulmonary effort is normal. No respiratory distress.     Breath sounds: Normal breath sounds.  Abdominal:     General: Bowel sounds are normal. There is no distension.     Palpations: Abdomen is soft.     Tenderness: There is no abdominal tenderness.  Musculoskeletal:     Cervical back: Neck supple.     Right lower leg: No edema.     Left lower leg: No edema.     Comments:  Is able to move all extremities  Has poor fine motor abilities       Has generalized weakness present    Lymphadenopathy:     Cervical: No cervical adenopathy.  Skin:    General: Skin is warm and dry.  Neurological:     Mental Status: She is alert and oriented to person, place, and time.  Psychiatric:        Mood and Affect: Mood normal.       ASSESSMENT/ PLAN:  TODAY  1. Aortic atherosclerosis 2. CKD stage 3 due to type 2 diabetes mellitus 3. Cervical myelopathy    Will continue current medications Will continue current plan of care' Will continue to monitor her status.   Time spent with patient and family 40 minutes >50% spent with counseling to include: level of independence with her adls; medications; goals of care.    Ok Edwards NP [facility]  Contact [phone] Monday through Friday 8am- 5pm  After hours  call [phone]

## 2020-10-08 ENCOUNTER — Inpatient Hospital Stay (HOSPITAL_COMMUNITY): Payer: Medicare Other

## 2020-10-08 ENCOUNTER — Other Ambulatory Visit: Payer: Self-pay

## 2020-10-08 ENCOUNTER — Encounter (HOSPITAL_COMMUNITY): Payer: Self-pay

## 2020-10-08 VITALS — BP 139/71 | HR 85 | Temp 97.2°F | Resp 18

## 2020-10-08 DIAGNOSIS — D631 Anemia in chronic kidney disease: Secondary | ICD-10-CM

## 2020-10-08 DIAGNOSIS — E860 Dehydration: Secondary | ICD-10-CM

## 2020-10-08 DIAGNOSIS — N183 Chronic kidney disease, stage 3 unspecified: Secondary | ICD-10-CM

## 2020-10-08 DIAGNOSIS — N189 Chronic kidney disease, unspecified: Secondary | ICD-10-CM | POA: Diagnosis not present

## 2020-10-08 LAB — CBC
HCT: 33 % — ABNORMAL LOW (ref 36.0–46.0)
Hemoglobin: 10.2 g/dL — ABNORMAL LOW (ref 12.0–15.0)
MCH: 29.3 pg (ref 26.0–34.0)
MCHC: 30.9 g/dL (ref 30.0–36.0)
MCV: 94.8 fL (ref 80.0–100.0)
Platelets: 180 10*3/uL (ref 150–400)
RBC: 3.48 MIL/uL — ABNORMAL LOW (ref 3.87–5.11)
RDW: 14.8 % (ref 11.5–15.5)
WBC: 6.6 10*3/uL (ref 4.0–10.5)
nRBC: 0 % (ref 0.0–0.2)

## 2020-10-08 MED ORDER — EPOETIN ALFA-EPBX 10000 UNIT/ML IJ SOLN
10000.0000 [IU] | Freq: Once | INTRAMUSCULAR | Status: AC
  Administered 2020-10-08: 10000 [IU] via SUBCUTANEOUS
  Filled 2020-10-08: qty 1

## 2020-10-08 NOTE — Patient Instructions (Signed)
[facility] Discharge Instructions for Patients Receiving Chemotherap Today you received the following retacrit injection today.    If you develop nausea and vomiting that is not controlled by your nausea medication, call the clinic.   BELOW ARE SYMPTOMS THAT SHOULD BE REPORTED IMMEDIATELY:  *FEVER GREATER THAN 100.5 F  *CHILLS WITH OR WITHOUT FEVER  NAUSEA AND VOMITING THAT IS NOT CONTROLLED WITH YOUR NAUSEA MEDICATION  *UNUSUAL SHORTNESS OF BREATH  *UNUSUAL BRUISING OR BLEEDING  TENDERNESS IN MOUTH AND THROAT WITH OR WITHOUT PRESENCE OF ULCERS  *URINARY PROBLEMS  *BOWEL PROBLEMS  UNUSUAL RASH Items with * indicate a potential emergency and should be followed up as soon as possible.  Feel free to call the clinic should you have any questions or concerns. The clinic phone number is [phone].  Please show the [place] at check-in to the Emergency Department and triage nurse.

## 2020-10-08 NOTE — Progress Notes (Signed)
Patient presents today for Retacrit injection. Hemoglobin reviewed prior to administration. VSS. Injection tolerated without incident or complaint. See MAR for details. Patient stable during and after injection.  Patient discharged in satisfactory condition with follow up instructions. 

## 2020-10-15 ENCOUNTER — Encounter: Payer: Self-pay | Admitting: Adult Health

## 2020-10-15 ENCOUNTER — Non-Acute Institutional Stay (SKILLED_NURSING_FACILITY): Payer: Medicare Other | Admitting: Adult Health

## 2020-10-15 DIAGNOSIS — I700 Atherosclerosis of aorta: Secondary | ICD-10-CM | POA: Diagnosis not present

## 2020-10-15 DIAGNOSIS — R627 Adult failure to thrive: Secondary | ICD-10-CM | POA: Diagnosis not present

## 2020-10-15 DIAGNOSIS — E538 Deficiency of other specified B group vitamins: Secondary | ICD-10-CM | POA: Diagnosis not present

## 2020-10-15 NOTE — Progress Notes (Signed)
Location:  [facility] Room Number: 315 Place of Service:  SNF (31)   CODE STATUS: dnr  Allergies  Allergen Reactions  . Latex Rash  . Levofloxacin Nausea And Vomiting  . Neomycin-Bacitracin-Polymyxin  [Bacitracin-Neomycin-Polymyxin] Rash  . Other Rash and Swelling  . Prednisone Hives, Itching, Rash and Other (See Comments)  . Fish Allergy     rash  . Neosporin [Neomycin-Polymyxin-Gramicidin] Itching and Rash    Chief Complaint  Patient presents with  . Medical Management of Chronic Issues           Vitamin  B 12 deficiency:  Failure to thrive in adult:   Aortic atherosclerosis     HPI:  She is a 75 year old long term resident of this facility being seen for the management of her chronic illnesses Vitamin  B 12 deficiency:  Failure to thrive in adult:   Aortic atherosclerosis. There are no reports of uncontrolled pain. She is slowly gaining weight; which is desirable. There are no reports of anxiety or insomnia.   Past Medical History:  Diagnosis Date  . Allergy   . Anemia    pernicious anemia  . Asthma   . Blood transfusion without reported diagnosis   . CKD (chronic kidney disease)   . COPD (chronic obstructive pulmonary disease) ([place]) 12/23/2019  . Gastritis   . Glaucoma   . History of blood in urine   . Hyperlipidemia   . Hypertension   . Ocular hypertension   . Osteoporosis   . Type 2 diabetes mellitus ([place]) 12/23/2019  . Vitamin B12 deficiency anemia due to intrinsic factor deficiency     Past Surgical History:  Procedure Laterality Date  . CHOLECYSTECTOMY  2008  . COLONOSCOPY  04/30/2011   Procedure: COLONOSCOPY;  Surgeon: Rogene Houston, MD;  Location: [facility];  Service: Endoscopy;  Laterality: N/A;  . COLONOSCOPY  05/25/2012   Procedure: COLONOSCOPY;  Surgeon: Rogene [facility], MD;  Location: [facility];  Service: Endoscopy;  Laterality: N/A;  1:25-changed to 1200 Ann to notify pt  . COLONOSCOPY Bilateral 12/2017  .  ESOPHAGOGASTRODUODENOSCOPY N/A 12/07/2019   Procedure: ESOPHAGOGASTRODUODENOSCOPY (EGD);  Surgeon: Rogene [facility], MD;  Location: [facility];  Service: Endoscopy;  Laterality: N/A;  155  . GALLBLADDER SURGERY    . right breast cystectomy  1988  . TUBAL LIGATION  1975    Social History   Socioeconomic History  . Marital status: Divorced    Spouse name: Not on file  . Number of children: 4  . Years of education: 9  . Highest education level: Not on file  Occupational History  . Occupation: retired   Tobacco Use  . Smoking status: Former Smoker    Packs/day: 1.00    Years: 40.00    Pack years: 40.00    Quit date: 04/03/2011    Years since quitting: 9.5  . Smokeless tobacco: Never Used  Vaping Use  . Vaping Use: Never used  Substance and Sexual Activity  . Alcohol use: No  . Drug use: No  . Sexual activity: Not Currently  Other Topics Concern  . Not on file  Social History Narrative   01/17/[birth year] lives at [facility], [place]   Social Determinants of Health   Financial Resource Strain: Not on file  Food Insecurity: Not on file  Transportation Needs: Not on file  Physical Activity: Not on file  Stress: Not on file  Social Connections: Not on file  Intimate Partner Violence: Not on file   Family History  Problem Relation Age of Onset  . Diabetes Mother   . Hyperlipidemia Mother   . Hypertension Mother   . Heart disease Father   . Hyperlipidemia Father   . Diabetes Sister   . Hyperlipidemia Sister   . Hypertension Sister   . Cancer Brother   . Heart disease Brother   . Hyperlipidemia Brother   . Hypertension Brother   . Diabetes Sister       VITAL SIGNS BP 126/74   Pulse 72   Temp 97.6 F (36.4 C)   Ht 5\' 4"  (1.626 m)   Wt 97 lb 6.4 oz (44.2 kg)   BMI 16.72 kg/m   Facility-Administered Encounter Medications as of 10/15/2020  Medication  . epoetin alfa-epbx (RETACRIT) injection 10,000 Units   Outpatient Encounter Medications as of 10/15/2020   Medication Sig  . acetaminophen (TYLENOL) 325 MG tablet Take 2 tablets (650 mg total) by mouth every 6 (six) hours as needed for mild pain (or Fever >/= 101).  Marland Kitchen albuterol (VENTOLIN HFA) 108 (90 Base) MCG/ACT inhaler Inhale 2 puffs into the lungs every 4 (four) hours as needed for wheezing or shortness of breath.  Marland Kitchen amLODipine (NORVASC) 10 MG tablet Take 1 tablet (10 mg total) by mouth daily.  Marland Kitchen apixaban (ELIQUIS) 5 MG TABS tablet Take 1 tablet (5 mg total) by mouth 2 (two) times daily.  Roseanne Kaufman [place]-Castor Oil (VENELEX) OINT Apply topically 3 (three) times daily. Special Instructions: Apply to sacrum and bilateral buttocks qshift for prevention.  . cyanocobalamin (,VITAMIN B-12,) 1000 MCG/ML injection Inject 1 mL (1,000 mcg total) into the muscle every 30 (thirty) days.  . dorzolamide (TRUSOPT) 2 % ophthalmic solution Place 1 drop into both eyes 2 (two) times daily.  . Ensure (ENSURE) Take 237 mLs by mouth 3 (three) times daily between meals.  . folic acid (FOLVITE) 1 MG tablet Take 1 mg by mouth daily.   Marland Kitchen gabapentin (NEURONTIN) 100 MG capsule Take 100 mg by mouth at bedtime.  Marland Kitchen latanoprost (XALATAN) 0.005 % ophthalmic solution Place 1 drop into both eyes at bedtime.  . metoCLOPramide (REGLAN) 5 MG tablet Take 1 tablet (5 mg total) by mouth 3 (three) times daily before meals.  . NON FORMULARY Mechanical soft diet ALLERGIC TO FISH  . omeprazole (PRILOSEC) 40 MG capsule Take 40 mg by mouth daily.   . ondansetron (ZOFRAN ODT) 4 MG disintegrating tablet Take 1 tablet (4 mg total) by mouth every 8 (eight) hours as needed for nausea or vomiting. Dissolve under tongue     SIGNIFICANT DIAGNOSTIC EXAMS   PREVIOUS   12-23-19: ct angio of chest:  1. No evidence of pulmonary embolism. 2. Paraseptal emphysema, mild and worse at the lung apices. 3. 3 mm nodule along the fissure in the RIGHT lower lobe. . 4. Moderate and with 3.6 cm caliber of ascending thoracic aorta.. Aortic aneurysm  5.  Emphysema and aortic atherosclerosis  02-02-20: bilateral lower extremity venous ultrasound:  Extensive acute appearing left femoropopliteal occlusive DVT extending into the calf veins. Negative for right lower extremity DVT  02-05-20: MRI cervical spine:  1. Widespread cervical spine degeneration. No acute osseous abnormality. 2. Multilevel mild to moderate spinal stenosis and spinal cord mass effect at C3-C4 through C6-C7, with possible spinal cord myelomalacia at the C6 level. No other cord signal abnormality. 3. Moderate or severe degenerative neural foraminal stenosis at the bilateral C5 and C6 nerve levels.  02-05-20: MRI thoracic spine:  Normal for age MRI appearance of the thoracic spine   02-06-20: ct of chest abdomen and pelvis:  1. No definite evidence of malignancy in the chest, abdomen, or pelvis. 2. Unchanged small bilateral lung nodules measuring up to 3 mm. Non-contrast chest CT can be considered in 12 months.  3. Large stool burden with rectal distension and presacral inflammation which may reflect stercoral proctitis. 4. Known left lower extremity DVT. 5. Nonobstructing nephrolithiasis. 6. Aortic Atherosclerosis and Emphysema .  NO NEW EXAMS.     LABS REVIEWED PREVIOUS   12-23-19: wbc 8.2; hgb 7.3; hct 25.2; mcv 58.3 plt 220; glucose 114; bun 15; creat 1.46; k+ 3.2; na++ 137 ca 9.4 liver normal albumin 4.1 d-dimer 0.86; mag 1.5; phos 3.3; urine culture: e-coli 12-25-19: wbc 8.2; hgb 7.1; hct 23.5; mcv 57.6 plt 215; glucose 111; bun 8; creat 1.08; k+ 4.2; na++ 135 ca 8.8; mag 1.6 12-26-19: wbc 7.9; hgb 9.5; hct 30.0 mcv 63.6 plt 205; glucose 110; bun 13; creat 1.21; k+ 4.1; na++ 136; ca 9.2 phos 3.4 albumin 3.6  01-01-20: wbc 10.8; hgb 9.9; hct 32.5; mcv 65.4 plt 282; glucose 99; bun 21; creat 1.41; k+ 3.9; na++ 133;ca 9.1 mag 1.9 free t4: 1.37 free t3: 2.1 tsh 7.150 01-08-20: ANA neg; CRP 15.6 01-09-20: wbc 8.2; hgb 8.2; hct 25.9; mcv 66.6 plt 238;  01-10-20: wbc 10.2; hgb  8.9; hct 28.2; mcv 67.6 plt 290; gucose 117; bun 25; creat 1.59; k+ 4.2; na++ 138; ca 9.2; live normal albumin 3.4 mag 1.9 RPR neg 02-01-20: wbc 10.6; hgb 28.1; mcv 74.1 plt 271; glucose 140 bun 27; creat 1.28; k+ 4.4; an++ 120; ca 8.9 liver normal albumin 3.4 mag 1.9 02-04-20: wbc 4.7; hgb 9.2; hct 28.7; mcv 78.2 plt 273 02-05-20: wbc 5.0; hgb 9.4; hct 29.2; mcv 78.3 plt 270; glucose 95; bun 19; creat 0.90; k+ 3.5; na++ 135; ca 8.6 liver normal albumin 2.6 02-08-20; glucose 85; bun 13; creat 0.91; k+ 3.6; na++ 134; ca 8.6 02-14-20: tsh 6.564 free T3: 2.2 free T4: 1.80 02-16-20: wbc 5.9; hgb 9.6; hct 30.3; mcv 81.9 plt 312; glucose 86; bun 15; creat 1.07; k+ 3.5; na++ 137; ca 8.4  03-01-20: wbc 7.3; hgb 10.1; hct 31.8; mcv 86.6 plt 195; glucose 100; bun 16; creat 1.08 ;k+ 3.5; na++ 137; ca 8.8  03-06-20; wbc 8.4; hgb 11.0; hct 35.7; mcv 87.1 plt 205; glucose 112; bun 17; creat 1.18; k+ 3.8; na++ 134; ca 9.2 liver normal albumin 3.5; ferritin 966; iron 24; tibc 158 vit B 12: 1294; vit D 41.02 03-20-20; wbc 10.9; hgb 10.5; hct 34.3; mcv 89.8 plt 293; glucose 114; bun 18; creat 1.11; k+ 4.4; na++ 139; ca 9.5 liver normal albumin 3.4 vit B 12: 1376; vit D 45.43  04-03-20: wbc 8.7; hgb 10.5; hct 34.3; mcv 91.7 plt 324 04-17-20: wbc 7.6; hgb 10.4; hct 32.9; mcv 92.2 plt 270 05-02-20: wbc 7.2; hgb 10.3; hct 33.0; mcv 94.3 plt 243 05-16-20: wbc 7.1; hgb 11.3; hct 35.5; mcv 92.9 plt 255 05-20-20: hgb a1c 4.8; hcol 197; ldl 130; trig 70 hdl 53 urine micro-albumin 15.8 05-30-20: wbc 7.5; hgb 10.8; hct 34.3; mcv 92.5 plt 248    LABS REVIEWED TODAY;   08-27-20: wbc 7.8; hgb 11.0; hct 35.0; mcv 994.6 plt 218 09-05-20: tsh 2.291 free t4: 0.96 09-10-20: wbc 6.8; hgb 10.2; hct 33.2; mcv 94.3 plt 193    Review of Systems  Constitutional: Negative for malaise/fatigue.  Respiratory: Negative  for cough and shortness of breath.   Cardiovascular: Negative for chest pain, palpitations and leg swelling.  Gastrointestinal: Negative  for abdominal pain, constipation and heartburn.  Musculoskeletal: Negative for back pain, joint pain and myalgias.  Skin: Negative.   Neurological: Negative for dizziness.  Psychiatric/Behavioral: The patient is not nervous/anxious.    .   Physical Exam Constitutional:      General: She is not in acute distress.    Appearance: She is underweight. She is not diaphoretic.  Neck:     Thyroid: No thyromegaly.  Cardiovascular:     Rate and Rhythm: Normal rate and regular rhythm.     Pulses: Normal pulses.     Heart sounds: Normal heart sounds.  Pulmonary:     Effort: Pulmonary effort is normal. No respiratory distress.     Breath sounds: Normal breath sounds.  Abdominal:     General: Bowel sounds are normal. There is no distension.     Palpations: Abdomen is soft.     Tenderness: There is no abdominal tenderness.  Musculoskeletal:     Cervical back: Neck supple.     Right lower leg: No edema.     Left lower leg: No edema.     Comments:   Is able to move all extremities  Has poor fine motor abilities       Has generalized weakness present     Lymphadenopathy:     Cervical: No cervical adenopathy.  Skin:    General: Skin is warm and dry.  Neurological:     Mental Status: She is alert and oriented to person, place, and time.  Psychiatric:        Mood and Affect: Mood normal.      ASSESSMENT/ PLAN:  TODAY      1. Vitamin B 12 deficiency: is stable will continue monthly B 12 injections and folic acid 1 mg daily   2. Failure to thrive in adult: is stable she is gaining weight currently at 97 pounds will continue supplements as directed: no further megace due to DVT; has completed remeron  3. Aortic atherosclerosis is stable (ct 12-23-19) will monitor    PREVIOUS   4. Acute deep vein thrombosis (DVT) of proximal vein of left lower extremity: diagnosed 02-02-20: will continue eliquis 5 mg twice daily   5. Chronic obstructive pulmonary disease unspecified COPD type: is stable  will continue albuterol 2 puffs every 4 hours as needed.   6. Peripheral neuropathy: is stable will continue gabapentin 100 mg nightly   7 .CKD stage 3 due to diabetes mellitus type 2 is stable bun 18 creat 1.11  8. Type 2 diabetes mellitus with stage 3 chronic kidney disease without long term current use of insulin: is stable hgb a1c 4.8 will monitor   9. Other chronic gastritis without hemorrhage is stable will continue prilosec 40 mg daily and reglan 5 mg prior to meals.   10. Hypertension associated with stage 3 chronic kidney disease due to type 2 diabetes mellitus is stable b/p 126/74. Will continue norvasc 10 mg daily   11. Anemia due to chronic renal failure treated with erythropoietin stage 3; is stable hgb 10.2; will continue to monitor is treated per oncology  12. Increased intraocular pressure bilateral: is stable will continue trusopt to both eyes twice daily and xalatan to eyes nightly       Ok Edwards NP [facility]  Contact [phone] Monday through Friday 8am- 5pm  After hours call [phone]

## 2020-10-21 ENCOUNTER — Other Ambulatory Visit (HOSPITAL_COMMUNITY): Payer: Self-pay

## 2020-10-21 ENCOUNTER — Other Ambulatory Visit (HOSPITAL_COMMUNITY)
Admission: RE | Admit: 2020-10-21 | Discharge: 2020-10-21 | Disposition: A | Discharge status: Home / Self Care | Payer: Medicare Other | Source: Skilled Nursing Facility | Attending: Adult Health | Admitting: Adult Health

## 2020-10-21 DIAGNOSIS — E1129 Type 2 diabetes mellitus with other diabetic kidney complication: Secondary | ICD-10-CM | POA: Diagnosis not present

## 2020-10-21 DIAGNOSIS — D509 Iron deficiency anemia, unspecified: Secondary | ICD-10-CM

## 2020-10-21 DIAGNOSIS — D631 Anemia in chronic kidney disease: Secondary | ICD-10-CM

## 2020-10-21 LAB — LIPID PANEL
Cholesterol: 169 mg/dL (ref 0–200)
HDL: 49 mg/dL (ref >40)
LDL Cholesterol: 103 mg/dL — ABNORMAL HIGH (ref 0–99)
Total CHOL/HDL Ratio: 3.4 RATIO
Triglycerides: 83 mg/dL (ref <150)
VLDL: 17 mg/dL (ref 0–40)

## 2020-10-21 LAB — HEMOGLOBIN A1C
Hgb A1c MFr Bld: 5 % (ref 4.8–5.6)
Mean Plasma Glucose: 96.8 mg/dL

## 2020-10-22 ENCOUNTER — Inpatient Hospital Stay (HOSPITAL_COMMUNITY): Payer: Medicare Other

## 2020-10-22 ENCOUNTER — Other Ambulatory Visit: Payer: Self-pay

## 2020-10-22 ENCOUNTER — Inpatient Hospital Stay (HOSPITAL_COMMUNITY): Payer: Medicare Other | Attending: Hematology

## 2020-10-22 VITALS — BP 132/64 | HR 78 | Temp 97.0°F | Resp 16

## 2020-10-22 DIAGNOSIS — D631 Anemia in chronic kidney disease: Secondary | ICD-10-CM | POA: Insufficient documentation

## 2020-10-22 DIAGNOSIS — D509 Iron deficiency anemia, unspecified: Secondary | ICD-10-CM

## 2020-10-22 DIAGNOSIS — E860 Dehydration: Secondary | ICD-10-CM

## 2020-10-22 DIAGNOSIS — N189 Chronic kidney disease, unspecified: Secondary | ICD-10-CM | POA: Insufficient documentation

## 2020-10-22 LAB — CBC WITH DIFFERENTIAL/PLATELET
Abs Immature Granulocytes: 0.02 10*3/uL (ref 0.00–0.07)
Basophils Absolute: 0 10*3/uL (ref 0.0–0.1)
Basophils Relative: 1 %
Eosinophils Absolute: 0.5 10*3/uL (ref 0.0–0.5)
Eosinophils Relative: 8 %
HCT: 35.6 % — ABNORMAL LOW (ref 36.0–46.0)
Hemoglobin: 11 g/dL — ABNORMAL LOW (ref 12.0–15.0)
Immature Granulocytes: 0 %
Lymphocytes Relative: 33 %
Lymphs Abs: 2.1 10*3/uL (ref 0.7–4.0)
MCH: 29.2 pg (ref 26.0–34.0)
MCHC: 30.9 g/dL (ref 30.0–36.0)
MCV: 94.4 fL (ref 80.0–100.0)
Monocytes Absolute: 0.3 10*3/uL (ref 0.1–1.0)
Monocytes Relative: 5 %
Neutro Abs: 3.4 10*3/uL (ref 1.7–7.7)
Neutrophils Relative %: 53 %
Platelets: 200 10*3/uL (ref 150–400)
RBC: 3.77 MIL/uL — ABNORMAL LOW (ref 3.87–5.11)
RDW: 15 % (ref 11.5–15.5)
WBC: 6.5 10*3/uL (ref 4.0–10.5)
nRBC: 0 % (ref 0.0–0.2)

## 2020-10-22 MED ORDER — EPOETIN ALFA-EPBX 10000 UNIT/ML IJ SOLN: 10000.0000 [IU] | Freq: Once | INTRAMUSCULAR | Status: DC

## 2020-10-22 NOTE — Progress Notes (Signed)
Patient here today for Retacrit injection.  Hemoglobin today was 11.0 so patient did not receive injection.  Patient left in satisfactory condition with no complaints voiced.

## 2020-10-23 DIAGNOSIS — Z23 Encounter for immunization: Secondary | ICD-10-CM | POA: Diagnosis not present

## 2020-10-30 DIAGNOSIS — M4802 Spinal stenosis, cervical region: Secondary | ICD-10-CM | POA: Diagnosis not present

## 2020-10-30 DIAGNOSIS — R262 Difficulty in walking, not elsewhere classified: Secondary | ICD-10-CM | POA: Diagnosis not present

## 2020-10-30 DIAGNOSIS — M6281 Muscle weakness (generalized): Secondary | ICD-10-CM | POA: Diagnosis not present

## 2020-10-30 DIAGNOSIS — I82402 Acute embolism and thrombosis of unspecified deep veins of left lower extremity: Secondary | ICD-10-CM | POA: Diagnosis not present

## 2020-10-30 DIAGNOSIS — R270 Ataxia, unspecified: Secondary | ICD-10-CM | POA: Diagnosis not present

## 2020-10-31 DIAGNOSIS — M4802 Spinal stenosis, cervical region: Secondary | ICD-10-CM | POA: Diagnosis not present

## 2020-10-31 DIAGNOSIS — M6281 Muscle weakness (generalized): Secondary | ICD-10-CM | POA: Diagnosis not present

## 2020-10-31 DIAGNOSIS — I82402 Acute embolism and thrombosis of unspecified deep veins of left lower extremity: Secondary | ICD-10-CM | POA: Diagnosis not present

## 2020-10-31 DIAGNOSIS — R262 Difficulty in walking, not elsewhere classified: Secondary | ICD-10-CM | POA: Diagnosis not present

## 2020-10-31 DIAGNOSIS — R270 Ataxia, unspecified: Secondary | ICD-10-CM | POA: Diagnosis not present

## 2020-11-01 DIAGNOSIS — M4802 Spinal stenosis, cervical region: Secondary | ICD-10-CM | POA: Diagnosis not present

## 2020-11-01 DIAGNOSIS — I82402 Acute embolism and thrombosis of unspecified deep veins of left lower extremity: Secondary | ICD-10-CM | POA: Diagnosis not present

## 2020-11-01 DIAGNOSIS — R270 Ataxia, unspecified: Secondary | ICD-10-CM | POA: Diagnosis not present

## 2020-11-01 DIAGNOSIS — R262 Difficulty in walking, not elsewhere classified: Secondary | ICD-10-CM | POA: Diagnosis not present

## 2020-11-01 DIAGNOSIS — M6281 Muscle weakness (generalized): Secondary | ICD-10-CM | POA: Diagnosis not present

## 2020-11-04 DIAGNOSIS — I82402 Acute embolism and thrombosis of unspecified deep veins of left lower extremity: Secondary | ICD-10-CM | POA: Diagnosis not present

## 2020-11-04 DIAGNOSIS — R262 Difficulty in walking, not elsewhere classified: Secondary | ICD-10-CM | POA: Diagnosis not present

## 2020-11-04 DIAGNOSIS — M6281 Muscle weakness (generalized): Secondary | ICD-10-CM | POA: Diagnosis not present

## 2020-11-04 DIAGNOSIS — M4802 Spinal stenosis, cervical region: Secondary | ICD-10-CM | POA: Diagnosis not present

## 2020-11-04 DIAGNOSIS — R270 Ataxia, unspecified: Secondary | ICD-10-CM | POA: Diagnosis not present

## 2020-11-05 ENCOUNTER — Other Ambulatory Visit: Payer: Self-pay

## 2020-11-05 ENCOUNTER — Encounter (HOSPITAL_COMMUNITY): Payer: Self-pay

## 2020-11-05 ENCOUNTER — Inpatient Hospital Stay (HOSPITAL_COMMUNITY): Payer: Medicare Other

## 2020-11-05 VITALS — BP 134/73 | HR 85 | Temp 97.2°F | Resp 16

## 2020-11-05 DIAGNOSIS — N183 Chronic kidney disease, stage 3 unspecified: Secondary | ICD-10-CM

## 2020-11-05 DIAGNOSIS — I739 Peripheral vascular disease, unspecified: Secondary | ICD-10-CM | POA: Diagnosis not present

## 2020-11-05 DIAGNOSIS — E860 Dehydration: Secondary | ICD-10-CM

## 2020-11-05 DIAGNOSIS — L603 Nail dystrophy: Secondary | ICD-10-CM | POA: Diagnosis not present

## 2020-11-05 DIAGNOSIS — N189 Chronic kidney disease, unspecified: Secondary | ICD-10-CM | POA: Diagnosis not present

## 2020-11-05 DIAGNOSIS — B351 Tinea unguium: Secondary | ICD-10-CM | POA: Diagnosis not present

## 2020-11-05 DIAGNOSIS — D631 Anemia in chronic kidney disease: Secondary | ICD-10-CM

## 2020-11-05 LAB — CBC
HCT: 33.3 % — ABNORMAL LOW (ref 36.0–46.0)
Hemoglobin: 10.4 g/dL — ABNORMAL LOW (ref 12.0–15.0)
MCH: 29.4 pg (ref 26.0–34.0)
MCHC: 31.2 g/dL (ref 30.0–36.0)
MCV: 94.1 fL (ref 80.0–100.0)
Platelets: 186 10*3/uL (ref 150–400)
RBC: 3.54 MIL/uL — ABNORMAL LOW (ref 3.87–5.11)
RDW: 14.7 % (ref 11.5–15.5)
WBC: 6.2 10*3/uL (ref 4.0–10.5)
nRBC: 0 % (ref 0.0–0.2)

## 2020-11-05 MED ORDER — EPOETIN ALFA-EPBX 10000 UNIT/ML IJ SOLN
INTRAMUSCULAR | Status: AC
  Filled 2020-11-05: qty 1

## 2020-11-05 MED ORDER — EPOETIN ALFA-EPBX 10000 UNIT/ML IJ SOLN
10000.0000 [IU] | Freq: Once | INTRAMUSCULAR | Status: AC
  Administered 2020-11-05: 10000 [IU] via SUBCUTANEOUS

## 2020-11-05 NOTE — Progress Notes (Signed)
Pt here for retacrit 10,000 units every 2 weeks.   Last dose 10/08/2020. Vital signs WNL for treatment today.  Hemoglobin is 10.4.  [facility] presents today for injection per the provider's orders.  retacrit 10000 units. administration without incident; injection site WNL; see MAR for injection details.  Patient tolerated procedure well and without incident.  No questions or complaints noted at this time. Pt stable during and after injection. Pt discharged in stable condition via wheelchair. AVS reviewed.

## 2020-11-05 NOTE — Patient Instructions (Signed)
[facility]  Discharge Instructions: Thank you for choosing [facility] to provide your oncology and hematology care.  If you have a lab appointment with the [facility], please come in thru the Main Entrance and check in at the main information desk.  Wear comfortable clothing and clothing appropriate for easy access to any Portacath or PICC line.   We strive to give you quality time with your provider. You may need to reschedule your appointment if you arrive late (15 or more minutes).  Arriving late affects you and other patients whose appointments are after yours.  Also, if you miss three or more appointments without notifying the office, you may be dismissed from the clinic at the provider's discretion.      For prescription refill requests, have your pharmacy contact our office and allow 72 hours for refills to be completed.    Retacrit today.  Hemoglobin 10.4.  Return as scheduled.  Please call the clinic if you have any questions or concerns.    To help prevent nausea and vomiting after your treatment, we encourage you to take your nausea medication as directed.  BELOW ARE SYMPTOMS THAT SHOULD BE REPORTED IMMEDIATELY: . *FEVER GREATER THAN 100.4 F (38 C) OR HIGHER . *CHILLS OR SWEATING . *NAUSEA AND VOMITING THAT IS NOT CONTROLLED WITH YOUR NAUSEA MEDICATION . *UNUSUAL SHORTNESS OF BREATH . *UNUSUAL BRUISING OR BLEEDING . *URINARY PROBLEMS (pain or burning when urinating, or frequent urination) . *BOWEL PROBLEMS (unusual diarrhea, constipation, pain near the anus) . TENDERNESS IN MOUTH AND THROAT WITH OR WITHOUT PRESENCE OF ULCERS (sore throat, sores in mouth, or a toothache) . UNUSUAL RASH, SWELLING OR PAIN  . UNUSUAL VAGINAL DISCHARGE OR ITCHING   Items with * indicate a potential emergency and should be followed up as soon as possible or go to the Emergency Department if any problems should occur.  Please show the CHEMOTHERAPY ALERT CARD or  IMMUNOTHERAPY ALERT CARD at check-in to the Emergency Department and triage nurse.  Should you have questions after your visit or need to cancel or reschedule your appointment, please contact [facility] [phone]  and follow the prompts.  Office hours are 8:00 a.m. to 4:30 p.m. Monday - Friday. Please note that voicemails left after 4:00 p.m. may not be returned until the following business day.  We are closed weekends and major holidays. You have access to a nurse at all times for urgent questions. Please call the main number to the clinic [phone] and follow the prompts.  For any non-urgent questions, you may also contact your provider using MyChart. We now offer e-Visits for anyone 109 and older to request care online for non-urgent symptoms. For details visit [email or web address].   Also download the MyChart app! Go to the app store, search "MyChart", open the app, select , and log in with your MyChart username and password.  Due to Covid, a mask is required upon entering the hospital/clinic. If you do not have a mask, one will be given to you upon arrival. For doctor visits, patients may have 1 support person aged 8 or older with them. For treatment visits, patients cannot have anyone with them due to current Covid guidelines and our immunocompromised population.

## 2020-11-06 DIAGNOSIS — M6281 Muscle weakness (generalized): Secondary | ICD-10-CM | POA: Diagnosis not present

## 2020-11-06 DIAGNOSIS — I82402 Acute embolism and thrombosis of unspecified deep veins of left lower extremity: Secondary | ICD-10-CM | POA: Diagnosis not present

## 2020-11-06 DIAGNOSIS — R262 Difficulty in walking, not elsewhere classified: Secondary | ICD-10-CM | POA: Diagnosis not present

## 2020-11-06 DIAGNOSIS — R270 Ataxia, unspecified: Secondary | ICD-10-CM | POA: Diagnosis not present

## 2020-11-06 DIAGNOSIS — M4802 Spinal stenosis, cervical region: Secondary | ICD-10-CM | POA: Diagnosis not present

## 2020-11-07 ENCOUNTER — Other Ambulatory Visit (HOSPITAL_COMMUNITY)
Admission: RE | Admit: 2020-11-07 | Discharge: 2020-11-07 | Disposition: A | Discharge status: Home / Self Care | Payer: Medicare Other | Source: Skilled Nursing Facility | Attending: Adult Health | Admitting: Adult Health

## 2020-11-07 DIAGNOSIS — D631 Anemia in chronic kidney disease: Secondary | ICD-10-CM | POA: Diagnosis not present

## 2020-11-07 DIAGNOSIS — N1832 Chronic kidney disease, stage 3b: Secondary | ICD-10-CM | POA: Insufficient documentation

## 2020-11-07 DIAGNOSIS — M4802 Spinal stenosis, cervical region: Secondary | ICD-10-CM | POA: Diagnosis not present

## 2020-11-07 DIAGNOSIS — R270 Ataxia, unspecified: Secondary | ICD-10-CM | POA: Diagnosis not present

## 2020-11-07 DIAGNOSIS — I82402 Acute embolism and thrombosis of unspecified deep veins of left lower extremity: Secondary | ICD-10-CM | POA: Diagnosis not present

## 2020-11-07 DIAGNOSIS — R262 Difficulty in walking, not elsewhere classified: Secondary | ICD-10-CM | POA: Diagnosis not present

## 2020-11-07 DIAGNOSIS — M6281 Muscle weakness (generalized): Secondary | ICD-10-CM | POA: Diagnosis not present

## 2020-11-07 LAB — RENAL FUNCTION PANEL
Albumin: 3.6 g/dL (ref 3.5–5.0)
Anion gap: 8 (ref 5–15)
BUN: 24 mg/dL — ABNORMAL HIGH (ref 8–23)
CO2: 27 mmol/L (ref 22–32)
Calcium: 9.2 mg/dL (ref 8.9–10.3)
Chloride: 106 mmol/L (ref 98–111)
Creatinine, Ser: 1.15 mg/dL — ABNORMAL HIGH (ref 0.44–1.00)
GFR, Estimated: 49 mL/min — ABNORMAL LOW (ref >60)
Glucose, Bld: 87 mg/dL (ref 70–99)
Phosphorus: 3.9 mg/dL (ref 2.5–4.6)
Potassium: 3.8 mmol/L (ref 3.5–5.1)
Sodium: 141 mmol/L (ref 135–145)

## 2020-11-07 LAB — IRON AND TIBC
Iron: 31 ug/dL (ref 28–170)
Saturation Ratios: 17 % (ref 10.4–31.8)
TIBC: 185 ug/dL — ABNORMAL LOW (ref 250–450)
UIBC: 154 ug/dL

## 2020-11-08 DIAGNOSIS — D638 Anemia in other chronic diseases classified elsewhere: Secondary | ICD-10-CM | POA: Diagnosis not present

## 2020-11-08 DIAGNOSIS — R634 Abnormal weight loss: Secondary | ICD-10-CM | POA: Diagnosis not present

## 2020-11-08 DIAGNOSIS — E871 Hypo-osmolality and hyponatremia: Secondary | ICD-10-CM | POA: Diagnosis not present

## 2020-11-08 DIAGNOSIS — I129 Hypertensive chronic kidney disease with stage 1 through stage 4 chronic kidney disease, or unspecified chronic kidney disease: Secondary | ICD-10-CM | POA: Diagnosis not present

## 2020-11-08 DIAGNOSIS — E1122 Type 2 diabetes mellitus with diabetic chronic kidney disease: Secondary | ICD-10-CM | POA: Diagnosis not present

## 2020-11-08 DIAGNOSIS — N189 Chronic kidney disease, unspecified: Secondary | ICD-10-CM | POA: Diagnosis not present

## 2020-11-08 LAB — MICROALBUMIN / CREATININE URINE RATIO
Creatinine, Urine: 54.9 mg/dL
Microalb Creat Ratio: 40 mg/g creat — ABNORMAL HIGH (ref 0–29)
Microalb, Ur: 21.9 ug/mL — ABNORMAL HIGH

## 2020-11-11 DIAGNOSIS — M6281 Muscle weakness (generalized): Secondary | ICD-10-CM | POA: Diagnosis not present

## 2020-11-11 DIAGNOSIS — M4802 Spinal stenosis, cervical region: Secondary | ICD-10-CM | POA: Diagnosis not present

## 2020-11-11 DIAGNOSIS — R270 Ataxia, unspecified: Secondary | ICD-10-CM | POA: Diagnosis not present

## 2020-11-11 DIAGNOSIS — R262 Difficulty in walking, not elsewhere classified: Secondary | ICD-10-CM | POA: Diagnosis not present

## 2020-11-11 DIAGNOSIS — I82402 Acute embolism and thrombosis of unspecified deep veins of left lower extremity: Secondary | ICD-10-CM | POA: Diagnosis not present

## 2020-11-13 DIAGNOSIS — I82402 Acute embolism and thrombosis of unspecified deep veins of left lower extremity: Secondary | ICD-10-CM | POA: Diagnosis not present

## 2020-11-13 DIAGNOSIS — R262 Difficulty in walking, not elsewhere classified: Secondary | ICD-10-CM | POA: Diagnosis not present

## 2020-11-13 DIAGNOSIS — R270 Ataxia, unspecified: Secondary | ICD-10-CM | POA: Diagnosis not present

## 2020-11-13 DIAGNOSIS — M4802 Spinal stenosis, cervical region: Secondary | ICD-10-CM | POA: Diagnosis not present

## 2020-11-13 DIAGNOSIS — M6281 Muscle weakness (generalized): Secondary | ICD-10-CM | POA: Diagnosis not present

## 2020-11-14 ENCOUNTER — Encounter (INDEPENDENT_AMBULATORY_CARE_PROVIDER_SITE_OTHER): Payer: Self-pay

## 2020-11-14 ENCOUNTER — Ambulatory Visit (INDEPENDENT_AMBULATORY_CARE_PROVIDER_SITE_OTHER): Payer: Medicare Other | Admitting: Gastroenterology

## 2020-11-14 ENCOUNTER — Encounter (INDEPENDENT_AMBULATORY_CARE_PROVIDER_SITE_OTHER): Payer: Self-pay | Admitting: Gastroenterology

## 2020-11-14 ENCOUNTER — Other Ambulatory Visit (INDEPENDENT_AMBULATORY_CARE_PROVIDER_SITE_OTHER): Payer: Self-pay

## 2020-11-14 VITALS — BP 161/84 | HR 79 | Temp 97.9°F | Ht 64.0 in | Wt 99.8 lb

## 2020-11-14 DIAGNOSIS — R634 Abnormal weight loss: Secondary | ICD-10-CM

## 2020-11-14 DIAGNOSIS — R262 Difficulty in walking, not elsewhere classified: Secondary | ICD-10-CM | POA: Diagnosis not present

## 2020-11-14 DIAGNOSIS — R270 Ataxia, unspecified: Secondary | ICD-10-CM | POA: Diagnosis not present

## 2020-11-14 DIAGNOSIS — R112 Nausea with vomiting, unspecified: Secondary | ICD-10-CM | POA: Diagnosis not present

## 2020-11-14 DIAGNOSIS — R159 Full incontinence of feces: Secondary | ICD-10-CM | POA: Insufficient documentation

## 2020-11-14 DIAGNOSIS — R151 Fecal smearing: Secondary | ICD-10-CM | POA: Diagnosis not present

## 2020-11-14 DIAGNOSIS — E440 Moderate protein-calorie malnutrition: Secondary | ICD-10-CM | POA: Diagnosis not present

## 2020-11-14 DIAGNOSIS — R197 Diarrhea, unspecified: Secondary | ICD-10-CM | POA: Diagnosis not present

## 2020-11-14 DIAGNOSIS — M4802 Spinal stenosis, cervical region: Secondary | ICD-10-CM | POA: Diagnosis not present

## 2020-11-14 DIAGNOSIS — M6281 Muscle weakness (generalized): Secondary | ICD-10-CM | POA: Diagnosis not present

## 2020-11-14 DIAGNOSIS — I82402 Acute embolism and thrombosis of unspecified deep veins of left lower extremity: Secondary | ICD-10-CM | POA: Diagnosis not present

## 2020-11-14 NOTE — H&P (View-Only) (Signed)
Traci Parker, M.D. Gastroenterology & Hepatology [facility] [address] [place], [place] [address]  Primary Care Physician: Green, Deborah S, NP [address] [place]  [address]  I will communicate my assessment and recommendations to the referring MD via EMR.  Problems: 1. Recurrent nausea and vomiting 2. Unintentional weight loss  History of Present Illness: Traci Parker is a 74 y.o. 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  The patient was last seen on 03/14/2020. At that time, the patient was advised to continue omeprazole 40 mg every day and she was told to call back if she had recurrent episodes of nausea/vomiting to schedule endoscopic ultrasound.  She was also advised to follow-up with dentist.  In fact, the patient had dental work performed and now is able to eat more consistent food.  The patient has lost 14 lb since the last time she was seen in our clinic on 03/14/2020. Her caregiver states she lost more weight before but since she had her dentures she was able to have her diet changed to soft diet from puree consistency- she was actually 92 lb 3 months ago. She is not drinking any protein shakes at this moment as it leads to looser bowel movements.  Patient reports the last 2 months she has presented episodes of defecation when she tries to urinate. She is having these episodes possibly.  Times every day, which leads to fecal smearing in her udnerwear. States that a little less than 6 months ago her stools have been soft in nature. She is not taking any antidiarrheals.  Patient states in the last month she vomited twice, both times it happened after she ate.  She is not sure why she has presented these episodes despite taking the Zofran as previously told.  The  patient denies having any nausea, vomiting, fever, chills, hematochezia, melena, hematemesis, abdominal distention, abdominal pain, diarrhea, jaundice, pruritus or weight loss.  Last EGD:2021 - found to have diffuse edema and friability in the entire stomach.  Biopsies were collected which were negative for H. pylori, consistent with chronic gastritis.  Last Colonoscopy: 2018 per patient, patient was told she had polyps but no report is available  Past Medical History: Past Medical History:  Diagnosis Date  . Allergy   . Anemia    pernicious anemia  . Asthma   . Blood transfusion without reported diagnosis   . CKD (chronic kidney disease)   . COPD (chronic obstructive pulmonary disease) (HCC) 12/23/2019  . Gastritis   . Glaucoma   . History of blood in urine   . Hyperlipidemia   . Hypertension   . Ocular hypertension   . Osteoporosis   . Type 2 diabetes mellitus (HCC) 12/23/2019  . Vitamin B12 deficiency anemia due to intrinsic factor deficiency     Past Surgical History: Past Surgical History:  Procedure Laterality Date  . CHOLECYSTECTOMY  2008  . COLONOSCOPY  04/30/2011   Procedure: COLONOSCOPY;  Surgeon: Najeeb U Rehman, MD;  [facility];  Service: Endoscopy;  Laterality: N/A;  . COLONOSCOPY  05/25/2012   Procedure: COLONOSCOPY;  Surgeon: Najeeb U Rehman, MD;  [facility];  Service: Endoscopy;  Laterality: N/A;  1:25-changed to 1200 Ann to notify pt  . COLONOSCOPY Bilateral 12/2017  . ESOPHAGOGASTRODUODENOSCOPY N/A 12/07/2019   Procedure: ESOPHAGOGASTRODUODENOSCOPY (EGD);  Surgeon:   Rogene Houston, MD;  [facility];  Service: Endoscopy;  Laterality: N/A;  155  . GALLBLADDER SURGERY    . right breast cystectomy  1988  . TUBAL LIGATION  1975    Family History: Family History  Problem Relation Age of Onset  . Diabetes Mother   . Hyperlipidemia Mother   . Hypertension Mother   . Heart disease Father   . Hyperlipidemia Father   .  Diabetes Sister   . Hyperlipidemia Sister   . Hypertension Sister   . Cancer Brother   . Heart disease Brother   . Hyperlipidemia Brother   . Hypertension Brother   . Diabetes Sister     Social History: Social History   Tobacco Use  Smoking Status Former Smoker  . Packs/day: 1.00  . Years: 40.00  . Pack years: 40.00  . Quit date: 04/03/2011  . Years since quitting: 9.6  Smokeless Tobacco Never Used   Social History   Substance and Sexual Activity  Alcohol Use No   Social History   Substance and Sexual Activity  Drug Use No    Allergies: Allergies  Allergen Reactions  . Latex Rash  . Levofloxacin Nausea And Vomiting  . Neomycin-Bacitracin-Polymyxin  [Bacitracin-Neomycin-Polymyxin] Rash  . Other Rash and Swelling  . Prednisone Hives, Itching, Rash and Other (See Comments)  . Fish Allergy     rash  . Neosporin [Neomycin-Polymyxin-Gramicidin] Itching and Rash    Medications: No current facility-administered medications for this visit.   No current outpatient medications on file.   Facility-Administered Medications Ordered in Other Visits  Medication Dose Route Frequency Provider Last Rate Last Admin  . epoetin alfa-epbx (RETACRIT) injection 10,000 Units  10,000 Units Subcutaneous Once Derek Jack, MD        Review of Systems: GENERAL: negative for malaise, night sweats HEENT: No changes in hearing or vision, no nose bleeds or other nasal problems. NECK: Negative for lumps, goiter, pain and significant neck swelling RESPIRATORY: Negative for cough, wheezing CARDIOVASCULAR: Negative for chest pain, leg swelling, palpitations, orthopnea GI: SEE HPI MUSCULOSKELETAL: Negative for joint pain or swelling, back pain, and muscle pain. SKIN: Negative for lesions, rash PSYCH: Negative for sleep disturbance, mood disorder and recent psychosocial stressors. HEMATOLOGY Negative for prolonged bleeding, bruising easily, and swollen nodes. ENDOCRINE: Negative for  cold or heat intolerance, polyuria, polydipsia and goiter. NEURO: negative for tremor, gait imbalance, syncope and seizures. The remainder of the review of systems is noncontributory.   Physical Exam: BP (!) 161/84 (BP Location: Right Arm, Patient Position: Sitting, Cuff Size: Small)   Pulse 79   Temp 97.9 F (36.6 C) (Oral)   Ht 5\' 4"  (1.626 m)   Wt 99 lb 12.8 oz (45.3 kg) Comment: weight per patient.  BMI 17.13 kg/m  GENERAL: The patient is AO x3, in no acute distress. Sitting in wheelchair, cannot stand up. Looks frail and underweight. HEENT: Head is normocephalic and atraumatic. EOMI are intact. Mouth is well hydrated and without lesions. NECK: Supple. No masses LUNGS: Clear to auscultation. No presence of rhonchi/wheezing/rales. Adequate chest expansion HEART: RRR, normal s1 and s2. ABDOMEN: Soft, nontender, no guarding, no peritoneal signs, and nondistended. BS +. No masses. EXTREMITIES: Without any cyanosis, clubbing, rash, lesions or edema. NEUROLOGIC: AOx3, no focal motor deficit. SKIN: no jaundice, no rashes  Imaging/Labs: as above  I personally reviewed and interpreted the available labs, imaging and endoscopic files.  Impression and Plan: Traci Parker is a 75 y.o. 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  In terms of her recurrent episodes of nausea, as I have previously discussed in the past it would be important to proceed with endoscopic ultrasound as previously planned at this will help us evaluate the etiology of her gastric thickening and previous significant weight loss.  I will refer her back to Dr. Laterrance Nauta Jacobs to undergo this procedure.  The patient and the family member understood and agreed.  She can continue taking the Zofran as needed to improve her symptomatology.  In terms of the episode of fecal  smearing, it is unclear if she is presenting fecal incontinence at this moment.  I consider it adequate to rule out infectious causes with stool testing but we also schedule a colonoscopy to obtain random colonic biopsies.  If these investigations are negative and the patient is interested in pursuing further testing, will need to perform an anorectal manometry.  She can take Imodium as needed to improve her episode of watery bowel movements.  Finally, she presented significant weight loss while waiting to have her teeth fixed but has gained some weight back after advancing her diet.  I do not see any contraindication to advise if started to have regular diet at this point.  - Schedule colonoscopy - will request clearance to stop Eliquis prior to procedure - Referal for gastric EUS - Start Imodium 1 tablet every 8 hours as needed for episodes of diarrhea - Continue Zofran 4 mg every 6 hours as needed for nausea/vomiting - Advance diet as tolerated, ideally full diet - Check C. Diff, GI path  All questions were answered.      Traci Glidden Parker Mayorga, MD Gastroenterology and Hepatology [facility]   

## 2020-11-14 NOTE — Progress Notes (Signed)
[name], M.D. Gastroenterology & Hepatology [facility] [address] [place], [place] [address]  Primary Care Physician: Gerlene Fee, NP [address] [address]  I will communicate my assessment and recommendations to the referring MD via EMR.  Problems: 1. Recurrent nausea and vomiting 2. Unintentional weight loss  History of Present Illness: Traci Parker is a 75 y.o. 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  The patient was last seen on 03/14/2020. At that time, the patient was advised to continue omeprazole 40 mg every day and she was told to call back if she had recurrent episodes of nausea/vomiting to schedule endoscopic ultrasound.  She was also advised to follow-up with dentist.  In fact, the patient had dental work performed and now is able to eat more consistent food.  The patient has lost 14 lb since the last time she was seen in our clinic on 03/14/2020. Her caregiver states she lost more weight before but since she had her dentures she was able to have her diet changed to soft diet from puree consistency- she was actually 92 lb 3 months ago. She is not drinking any protein shakes at this moment as it leads to looser bowel movements.  Patient reports the last 2 months she has presented episodes of defecation when she tries to urinate. She is having these episodes possibly.  Times every day, which leads to fecal smearing in her udnerwear. States that a little less than 6 months ago her stools have been soft in nature. She is not taking any antidiarrheals.  Patient states in the last month she vomited twice, both times it happened after she ate.  She is not sure why she has presented these episodes despite taking the Zofran as previously told.  The  patient denies having any nausea, vomiting, fever, chills, hematochezia, melena, hematemesis, abdominal distention, abdominal pain, diarrhea, jaundice, pruritus or weight loss.  Last NLG:9211 - found to have diffuse edema and friability in the entire stomach.  Biopsies were collected which were negative for H. pylori, consistent with chronic gastritis.  Last Colonoscopy: 2018 per patient, patient was told she had polyps but no report is available  Past Medical History: Past Medical History:  Diagnosis Date  . Allergy   . Anemia    pernicious anemia  . Asthma   . Blood transfusion without reported diagnosis   . CKD (chronic kidney disease)   . COPD (chronic obstructive pulmonary disease) ([place]) 12/23/2019  . Gastritis   . Glaucoma   . History of blood in urine   . Hyperlipidemia   . Hypertension   . Ocular hypertension   . Osteoporosis   . Type 2 diabetes mellitus ([place]) 12/23/2019  . Vitamin B12 deficiency anemia due to intrinsic factor deficiency     Past Surgical History: Past Surgical History:  Procedure Laterality Date  . CHOLECYSTECTOMY  2008  . COLONOSCOPY  04/30/2011   Procedure: COLONOSCOPY;  Surgeon: Rogene Houston, MD;  [facility];  Service: Endoscopy;  Laterality: N/A;  . COLONOSCOPY  05/25/2012   Procedure: COLONOSCOPY;  Surgeon: Rogene Houston, MD;  [facility];  Service: Endoscopy;  Laterality: N/A;  1:25-changed to 1200 Ann to notify pt  . COLONOSCOPY Bilateral 12/2017  . ESOPHAGOGASTRODUODENOSCOPY N/A 12/07/2019   Procedure: ESOPHAGOGASTRODUODENOSCOPY (EGD);  Surgeon:  Rogene Houston, MD;  [facility];  Service: Endoscopy;  Laterality: N/A;  155  . GALLBLADDER SURGERY    . right breast cystectomy  1988  . TUBAL LIGATION  1975    Family History: Family History  Problem Relation Age of Onset  . Diabetes Mother   . Hyperlipidemia Mother   . Hypertension Mother   . Heart disease Father   . Hyperlipidemia Father   .  Diabetes Sister   . Hyperlipidemia Sister   . Hypertension Sister   . Cancer Brother   . Heart disease Brother   . Hyperlipidemia Brother   . Hypertension Brother   . Diabetes Sister     Social History: Social History   Tobacco Use  Smoking Status Former Smoker  . Packs/day: 1.00  . Years: 40.00  . Pack years: 40.00  . Quit date: 04/03/2011  . Years since quitting: 9.6  Smokeless Tobacco Never Used   Social History   Substance and Sexual Activity  Alcohol Use No   Social History   Substance and Sexual Activity  Drug Use No    Allergies: Allergies  Allergen Reactions  . Latex Rash  . Levofloxacin Nausea And Vomiting  . Neomycin-Bacitracin-Polymyxin  [Bacitracin-Neomycin-Polymyxin] Rash  . Other Rash and Swelling  . Prednisone Hives, Itching, Rash and Other (See Comments)  . Fish Allergy     rash  . Neosporin [Neomycin-Polymyxin-Gramicidin] Itching and Rash    Medications: No current facility-administered medications for this visit.   No current outpatient medications on file.   Facility-Administered Medications Ordered in Other Visits  Medication Dose Route Frequency Provider Last Rate Last Admin  . epoetin alfa-epbx (RETACRIT) injection 10,000 Units  10,000 Units Subcutaneous Once Derek Jack, MD        Review of Systems: GENERAL: negative for malaise, night sweats HEENT: No changes in hearing or vision, no nose bleeds or other nasal problems. NECK: Negative for lumps, goiter, pain and significant neck swelling RESPIRATORY: Negative for cough, wheezing CARDIOVASCULAR: Negative for chest pain, leg swelling, palpitations, orthopnea GI: SEE HPI MUSCULOSKELETAL: Negative for joint pain or swelling, back pain, and muscle pain. SKIN: Negative for lesions, rash PSYCH: Negative for sleep disturbance, mood disorder and recent psychosocial stressors. HEMATOLOGY Negative for prolonged bleeding, bruising easily, and swollen nodes. ENDOCRINE: Negative for  cold or heat intolerance, polyuria, polydipsia and goiter. NEURO: negative for tremor, gait imbalance, syncope and seizures. The remainder of the review of systems is noncontributory.   Physical Exam: BP (!) 161/84 (BP Location: Right Arm, Patient Position: Sitting, Cuff Size: Small)   Pulse 79   Temp 97.9 F (36.6 C) (Oral)   Ht 5\' 4"  (1.626 m)   Wt 99 lb 12.8 oz (45.3 kg) Comment: weight per patient.  BMI 17.13 kg/m  GENERAL: The patient is AO x3, in no acute distress. Sitting in wheelchair, cannot stand up. Looks frail and underweight. HEENT: Head is normocephalic and atraumatic. EOMI are intact. Mouth is well hydrated and without lesions. NECK: Supple. No masses LUNGS: Clear to auscultation. No presence of rhonchi/wheezing/rales. Adequate chest expansion HEART: RRR, normal s1 and s2. ABDOMEN: Soft, nontender, no guarding, no peritoneal signs, and nondistended. BS +. No masses. EXTREMITIES: Without any cyanosis, clubbing, rash, lesions or edema. NEUROLOGIC: AOx3, no focal motor deficit. SKIN: no jaundice, no rashes  Imaging/Labs: as above  I personally reviewed and interpreted the available labs, imaging and endoscopic files.  Impression and Plan: Traci Parker is a 75 y.o. 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  In terms of her recurrent episodes of nausea, as I have previously discussed in the past it would be important to proceed with endoscopic ultrasound as previously planned at this will help [place] evaluate the etiology of her gastric thickening and previous significant weight loss.  I will refer her back to Dr. Owens Loffler to undergo this procedure.  The patient and the family member understood and agreed.  She can continue taking the Zofran as needed to improve her symptomatology.  In terms of the episode of fecal  smearing, it is unclear if she is presenting fecal incontinence at this moment.  I consider it adequate to rule out infectious causes with stool testing but we also schedule a colonoscopy to obtain random colonic biopsies.  If these investigations are negative and the patient is interested in pursuing further testing, will need to perform an anorectal manometry.  She can take Imodium as needed to improve her episode of watery bowel movements.  Finally, she presented significant weight loss while waiting to have her teeth fixed but has gained some weight back after advancing her diet.  I do not see any contraindication to advise if started to have regular diet at this point.  - Schedule colonoscopy - will request clearance to stop Eliquis prior to procedure - Referal for gastric EUS - Start Imodium 1 tablet every 8 hours as needed for episodes of diarrhea - Continue Zofran 4 mg every 6 hours as needed for nausea/vomiting - Advance diet as tolerated, ideally full diet - Check C. Diff, GI path  All questions were answered.      Harvel Quale, MD Gastroenterology and Hepatology [facility]

## 2020-11-14 NOTE — Patient Instructions (Addendum)
Schedule colonoscopy - will request clearance to stop Eliquis prior to procedure Referal for gastric EUS Start Imodium 1 tablet every 8 hours as needed for episodes of diarrhea Continue Zofran 4 mg every 6 hours as needed for nausea/vomiting Advance diet as tolerated, ideally full diet Perform stool workup

## 2020-11-17 ENCOUNTER — Encounter: Payer: Self-pay | Admitting: Internal Medicine

## 2020-11-18 ENCOUNTER — Non-Acute Institutional Stay (SKILLED_NURSING_FACILITY): Payer: Medicare Other | Admitting: Adult Health

## 2020-11-18 ENCOUNTER — Encounter: Payer: Self-pay | Admitting: Adult Health

## 2020-11-18 ENCOUNTER — Other Ambulatory Visit (HOSPITAL_COMMUNITY): Payer: Self-pay | Admitting: Surgery

## 2020-11-18 DIAGNOSIS — N183 Chronic kidney disease, stage 3 unspecified: Secondary | ICD-10-CM | POA: Diagnosis not present

## 2020-11-18 DIAGNOSIS — G6289 Other specified polyneuropathies: Secondary | ICD-10-CM | POA: Diagnosis not present

## 2020-11-18 DIAGNOSIS — D509 Iron deficiency anemia, unspecified: Secondary | ICD-10-CM

## 2020-11-18 DIAGNOSIS — I824Y2 Acute embolism and thrombosis of unspecified deep veins of left proximal lower extremity: Secondary | ICD-10-CM

## 2020-11-18 DIAGNOSIS — E1122 Type 2 diabetes mellitus with diabetic chronic kidney disease: Secondary | ICD-10-CM

## 2020-11-18 DIAGNOSIS — D631 Anemia in chronic kidney disease: Secondary | ICD-10-CM

## 2020-11-18 DIAGNOSIS — J449 Chronic obstructive pulmonary disease, unspecified: Secondary | ICD-10-CM | POA: Diagnosis not present

## 2020-11-18 NOTE — Progress Notes (Signed)
Location:  [facility] Room Number: A517121 Place of Service:  SNF (31)   CODE STATUS: dnr  Allergies  Allergen Reactions  . Latex Rash  . Levofloxacin Nausea And Vomiting  . Neomycin-Bacitracin-Polymyxin  [Bacitracin-Neomycin-Polymyxin] Rash  . Other Rash and Swelling  . Prednisone Hives, Itching, Rash and Other (See Comments)  . Fish Allergy     rash  . Neosporin [Neomycin-Polymyxin-Gramicidin] Itching and Rash    Chief Complaint  Patient presents with  . Medical Management of Chronic Issues            Acute deep vein thrombosis (DVT) of proximal vein of left lower extremity:   Chronic obstructive pulmonary disease unspecified COPD type:    Peripheral neuropathy:   CKD stage 3 due to diabetes mellitus type 2    HPI:  She is a 75 year old long term resident of this facility being seen for the management of her chronic illnesses:Acute deep vein thrombosis (DVT) of proximal vein of left lower extremity:   Chronic obstructive pulmonary disease unspecified COPD type:    Peripheral neuropathy:   CKD stage 3 due to diabetes mellitus type 2. There are no reports of uncontrolled pain. No coughing or shortness of breath. She has completed her eliquis will need low dose asa   Past Medical History:  Diagnosis Date  . Allergy   . Anemia    pernicious anemia  . Asthma   . Blood transfusion without reported diagnosis   . CKD (chronic kidney disease)   . COPD (chronic obstructive pulmonary disease) ([facility]) 12/23/2019  . Gastritis   . Glaucoma   . History of blood in urine   . Hyperlipidemia   . Hypertension   . Ocular hypertension   . Osteoporosis   . Type 2 diabetes mellitus ([facility]) 12/23/2019  . Vitamin B12 deficiency anemia due to intrinsic factor deficiency     Past Surgical History:  Procedure Laterality Date  . CHOLECYSTECTOMY  2008  . COLONOSCOPY  04/30/2011   Procedure: COLONOSCOPY;  Surgeon: Rogene Houston, MD;  Location: [facility];  Service:  Endoscopy;  Laterality: N/A;  . COLONOSCOPY  05/25/2012   Procedure: COLONOSCOPY;  Surgeon: Rogene Houston, MD;  Location: [facility];  Service: Endoscopy;  Laterality: N/A;  1:25-changed to 1200 Ann to notify pt  . COLONOSCOPY Bilateral 12/2017  . ESOPHAGOGASTRODUODENOSCOPY N/A 12/07/2019   Procedure: ESOPHAGOGASTRODUODENOSCOPY (EGD);  Surgeon: Rogene Houston, MD;  [facility]: [facility];  Service: Endoscopy;  Laterality: N/A;  155  . GALLBLADDER SURGERY    . right breast cystectomy  1988  . TUBAL LIGATION  1975    Social History   Socioeconomic History  . Marital status: Divorced    Spouse name: Not on file  . Number of children: 4  . Years of education: 9  . Highest education level: Not on file  Occupational History  . Occupation: retired   Tobacco Use  . Smoking status: Former Smoker    Packs/day: 1.00    Years: 40.00    Pack years: 40.00    Quit date: 04/03/2011    Years since quitting: 9.6  . Smokeless tobacco: Never Used  Vaping Use  . Vaping Use: Never used  Substance and Sexual Activity  . Alcohol use: No  . Drug use: No  . Sexual activity: Not Currently  Other Topics Concern  . Not on file  Social History Narrative   01/17/20 lives at [facility], [place]  Social Determinants of Health   Financial Resource Strain: Not on file  Food Insecurity: Not on file  Transportation Needs: Not on file  Physical Activity: Not on file  Stress: Not on file  Social Connections: Not on file  Intimate Partner Violence: Not on file   Family History  Problem Relation Age of Onset  . Diabetes Mother   . Hyperlipidemia Mother   . Hypertension Mother   . Heart disease Father   . Hyperlipidemia Father   . Diabetes Sister   . Hyperlipidemia Sister   . Hypertension Sister   . Cancer Brother   . Heart disease Brother   . Hyperlipidemia Brother   . Hypertension Brother   . Diabetes Sister       VITAL SIGNS BP 110/82   Pulse 70   Temp 97.6 F (36.4 C)    Resp 20   Ht 5\' 4"  (1.626 m)   Wt 99 lb 12.8 oz (45.3 kg)   SpO2 93%   BMI 17.13 kg/m   Facility-Administered Encounter Medications as of 11/18/2020  Medication  . epoetin alfa-epbx (RETACRIT) injection 10,000 Units   Outpatient Encounter Medications as of 11/18/2020  Medication Sig  . acetaminophen (TYLENOL) 325 MG tablet Take 2 tablets (650 mg total) by mouth every 6 (six) hours as needed for mild pain (or Fever >/= 101).  Marland Kitchen albuterol (VENTOLIN HFA) 108 (90 Base) MCG/ACT inhaler Inhale 2 puffs into the lungs every 4 (four) hours as needed for wheezing or shortness of breath. (Patient not taking: Reported on 11/14/2020)  . amLODipine (NORVASC) 10 MG tablet Take 1 tablet (10 mg total) by mouth daily.  Marland Kitchen apixaban (ELIQUIS) 5 MG TABS tablet Take 1 tablet (5 mg total) by mouth 2 (two) times daily.  Roseanne Kaufman Peru-Castor Oil (VENELEX) OINT Apply topically 3 (three) times daily. Special Instructions: Apply to sacrum and bilateral buttocks qshift for prevention.  . cyanocobalamin (,VITAMIN B-12,) 1000 MCG/ML injection Inject 1 mL (1,000 mcg total) into the muscle every 30 (thirty) days.  . dorzolamide (TRUSOPT) 2 % ophthalmic solution Place 1 drop into both eyes 2 (two) times daily. (Patient not taking: Reported on 11/14/2020)  . Ensure (ENSURE) Take 237 mLs by mouth 3 (three) times daily between meals. (Patient not taking: Reported on 11/14/2020)  . folic acid (FOLVITE) 1 MG tablet Take 1 mg by mouth daily.   Marland Kitchen gabapentin (NEURONTIN) 100 MG capsule Take 100 mg by mouth at bedtime.  Marland Kitchen latanoprost (XALATAN) 0.005 % ophthalmic solution Place 1 drop into both eyes at bedtime.  . metoCLOPramide (REGLAN) 5 MG tablet Take 1 tablet (5 mg total) by mouth 3 (three) times daily before meals.  . NON FORMULARY Mechanical soft diet ALLERGIC TO FISH (Patient not taking: Reported on 11/14/2020)  . omeprazole (PRILOSEC) 40 MG capsule Take 40 mg by mouth daily.   . ondansetron (ZOFRAN ODT) 4 MG disintegrating tablet Take 1  tablet (4 mg total) by mouth every 8 (eight) hours as needed for nausea or vomiting. Dissolve under tongue     SIGNIFICANT DIAGNOSTIC EXAMS   PREVIOUS   12-23-19: ct angio of chest:  1. No evidence of pulmonary embolism. 2. Paraseptal emphysema, mild and worse at the lung apices. 3. 3 mm nodule along the fissure in the RIGHT lower lobe. . 4. Moderate and with 3.6 cm caliber of ascending thoracic aorta.. Aortic aneurysm  5. Emphysema and aortic atherosclerosis  02-02-20: bilateral lower extremity venous ultrasound:  Extensive acute appearing left femoropopliteal occlusive DVT  extending into the calf veins. Negative for right lower extremity DVT  02-05-20: MRI cervical spine:  1. Widespread cervical spine degeneration. No acute osseous abnormality. 2. Multilevel mild to moderate spinal stenosis and spinal cord mass effect at C3-C4 through C6-C7, with possible spinal cord myelomalacia at the C6 level. No other cord signal abnormality. 3. Moderate or severe degenerative neural foraminal stenosis at the bilateral C5 and C6 nerve levels.   02-05-20: MRI thoracic spine:  Normal for age MRI appearance of the thoracic spine   02-06-20: ct of chest abdomen and pelvis:  1. No definite evidence of malignancy in the chest, abdomen, or pelvis. 2. Unchanged small bilateral lung nodules measuring up to 3 mm. Non-contrast chest CT can be considered in 12 months.  3. Large stool burden with rectal distension and presacral inflammation which may reflect stercoral proctitis. 4. Known left lower extremity DVT. 5. Nonobstructing nephrolithiasis. 6. Aortic Atherosclerosis and Emphysema .  NO NEW EXAMS.     LABS REVIEWED PREVIOUS   12-23-19: wbc 8.2; hgb 7.3; hct 25.2; mcv 58.3 plt 220; glucose 114; bun 15; creat 1.46; k+ 3.2; na++ 137 ca 9.4 liver normal albumin 4.1 d-dimer 0.86; mag 1.5; phos 3.3; urine culture: e-coli 12-25-19: wbc 8.2; hgb 7.1; hct 23.5; mcv 57.6 plt 215; glucose 111; bun 8; creat  1.08; k+ 4.2; na++ 135 ca 8.8; mag 1.6 12-26-19: wbc 7.9; hgb 9.5; hct 30.0 mcv 63.6 plt 205; glucose 110; bun 13; creat 1.21; k+ 4.1; na++ 136; ca 9.2 phos 3.4 albumin 3.6  01-01-20: wbc 10.8; hgb 9.9; hct 32.5; mcv 65.4 plt 282; glucose 99; bun 21; creat 1.41; k+ 3.9; na++ 133;ca 9.1 mag 1.9 free t4: 1.37 free t3: 2.1 tsh 7.150 01-08-20: ANA neg; CRP 15.6 01-09-20: wbc 8.2; hgb 8.2; hct 25.9; mcv 66.6 plt 238;  01-10-20: wbc 10.2; hgb 8.9; hct 28.2; mcv 67.6 plt 290; gucose 117; bun 25; creat 1.59; k+ 4.2; na++ 138; ca 9.2; live normal albumin 3.4 mag 1.9 RPR neg 02-01-20: wbc 10.6; hgb 28.1; mcv 74.1 plt 271; glucose 140 bun 27; creat 1.28; k+ 4.4; an++ 120; ca 8.9 liver normal albumin 3.4 mag 1.9 02-04-20: wbc 4.7; hgb 9.2; hct 28.7; mcv 78.2 plt 273 02-05-20: wbc 5.0; hgb 9.4; hct 29.2; mcv 78.3 plt 270; glucose 95; bun 19; creat 0.90; k+ 3.5; na++ 135; ca 8.6 liver normal albumin 2.6 02-08-20; glucose 85; bun 13; creat 0.91; k+ 3.6; na++ 134; ca 8.6 02-14-20: tsh 6.564 free T3: 2.2 free T4: 1.80 02-16-20: wbc 5.9; hgb 9.6; hct 30.3; mcv 81.9 plt 312; glucose 86; bun 15; creat 1.07; k+ 3.5; na++ 137; ca 8.4  03-01-20: wbc 7.3; hgb 10.1; hct 31.8; mcv 86.6 plt 195; glucose 100; bun 16; creat 1.08 ;k+ 3.5; na++ 137; ca 8.8  03-06-20; wbc 8.4; hgb 11.0; hct 35.7; mcv 87.1 plt 205; glucose 112; bun 17; creat 1.18; k+ 3.8; na++ 134; ca 9.2 liver normal albumin 3.5; ferritin 966; iron 24; tibc 158 vit B 12: 1294; vit D 41.02 03-20-20; wbc 10.9; hgb 10.5; hct 34.3; mcv 89.8 plt 293; glucose 114; bun 18; creat 1.11; k+ 4.4; na++ 139; ca 9.5 liver normal albumin 3.4 vit B 12: 1376; vit D 45.43  04-03-20: wbc 8.7; hgb 10.5; hct 34.3; mcv 91.7 plt 324 04-17-20: wbc 7.6; hgb 10.4; hct 32.9; mcv 92.2 plt 270 05-02-20: wbc 7.2; hgb 10.3; hct 33.0; mcv 94.3 plt 243 05-16-20: wbc 7.1; hgb 11.3; hct 35.5; mcv 92.9 plt 255 05-20-20: hgb  a1c 4.8; hcol 197; ldl 130; trig 70 hdl 53 urine micro-albumin 15.8 05-30-20: wbc 7.5; hgb  10.8; hct 34.3; mcv 92.5 plt 248   08-27-20: wbc 7.8; hgb 11.0; hct 35.0; mcv 994.6 plt 218 09-05-20: tsh 2.291 free t4: 0.96 09-10-20: wbc 6.8; hgb 10.2; hct 33.2; mcv 94.3 plt 193  TODAY  10-08-20: wbc 6.6; hgb 10.2; hct 33.0; mcv 94.8 plt 180;  10-21-20: chol 169; ldl 103; trig 83 hdl 49; hgb a1c 5.0 11-07-20: glucose 87; bun 24; creat 1.15; k+ 3.8; na++ 141; ca 9.2 GFR 49; phos 3.9; albumin 3.6; iron 31; TIBC 185; urine micro-albumin 21.9 11-19-20: wbc 5.8; hgb 10.1; hct 33.1; mcv 94.0 plt 199     Review of Systems  Constitutional: Negative for malaise/fatigue.  Respiratory: Negative for cough and shortness of breath.   Cardiovascular: Negative for chest pain, palpitations and leg swelling.  Gastrointestinal: Negative for abdominal pain, constipation and heartburn.  Musculoskeletal: Negative for back pain, joint pain and myalgias.  Skin: Negative.   Neurological: Negative for dizziness.  Psychiatric/Behavioral: The patient is not nervous/anxious.      Physical Exam Constitutional:      General: She is not in acute distress.    Appearance: She is underweight. She is not diaphoretic.  Neck:     Thyroid: No thyromegaly.  Cardiovascular:     Rate and Rhythm: Normal rate and regular rhythm.     Pulses: Normal pulses.     Heart sounds: Normal heart sounds.  Pulmonary:     Effort: Pulmonary effort is normal. No respiratory distress.     Breath sounds: Normal breath sounds.  Abdominal:     General: Bowel sounds are normal. There is no distension.     Palpations: Abdomen is soft.     Tenderness: There is no abdominal tenderness.  Musculoskeletal:     Cervical back: Neck supple.     Right lower leg: No edema.     Left lower leg: No edema.     Comments: Is able to move all extremities  Has poor fine motor abilities       Has generalized weakness present      Lymphadenopathy:     Cervical: No cervical adenopathy.  Skin:    General: Skin is warm and dry.  Neurological:     Mental  Status: She is alert and oriented to person, place, and time.  Psychiatric:        Mood and Affect: Mood normal.      ASSESSMENT/ PLAN:  TODAY      1. Acute deep vein thrombosis (DVT) of proximal vein of left lower extremity: diagnosed 02-02-20 will stop eliquis and will begin asa 81 mg daily   2. Chronic obstructive pulmonary disease unspecified COPD type: is stable will continue albuterol 2 puffs every 4 hours as needed   3. Peripheral neuropathy: is stable will continue gabapentin 100 mg nightly   4. CKD stage 3 due to diabetes mellitus type 2: is stable bun 24; creat 1.15; GFR 49   PREVIOUS   6. Peripheral neuropathy: is stable will continue gabapentin 100 mg nightly   7 .CKD stage 3 due to diabetes mellitus type 2 is stable bun 18 creat 1.11  8. Type 2 diabetes mellitus with stage 3 chronic kidney disease without long term current use of insulin: is stable hgb a1c 4.8 will monitor   9. Other chronic gastritis without hemorrhage is stable will continue prilosec 40 mg daily and reglan 5 mg prior  to meals.   10. Hypertension associated with stage 3 chronic kidney disease due to type 2 diabetes mellitus is stable b/p 126/74. Will continue norvasc 10 mg daily   11. Anemia due to chronic renal failure treated with erythropoietin stage 3; is stable hgb 10.2; will continue to monitor is treated per oncology  12. Increased intraocular pressure bilateral: is stable will continue trusopt to both eyes twice daily and xalatan to eyes nightly   13. Vitamin B 12 deficiency: is stable will continue monthly B 12 injections and folic acid 1 mg daily   14. Failure to thrive in adult: is stable she is gaining weight currently at 99 pounds will continue supplements as directed: no further megace due to DVT; has completed remeron  15. Aortic atherosclerosis is stable (ct 12-23-19) will monitor       Ok Edwards NP [facility]  Contact [phone] Monday through Friday 8am-  5pm  After hours call [phone]

## 2020-11-19 ENCOUNTER — Other Ambulatory Visit: Payer: Self-pay

## 2020-11-19 ENCOUNTER — Inpatient Hospital Stay (HOSPITAL_COMMUNITY): Payer: Medicare Other | Attending: Hematology

## 2020-11-19 ENCOUNTER — Inpatient Hospital Stay (HOSPITAL_COMMUNITY): Payer: Medicare Other

## 2020-11-19 ENCOUNTER — Encounter (HOSPITAL_COMMUNITY): Payer: Self-pay

## 2020-11-19 VITALS — BP 138/72 | HR 84 | Temp 96.9°F | Resp 18

## 2020-11-19 DIAGNOSIS — R262 Difficulty in walking, not elsewhere classified: Secondary | ICD-10-CM | POA: Diagnosis not present

## 2020-11-19 DIAGNOSIS — N183 Chronic kidney disease, stage 3 unspecified: Secondary | ICD-10-CM | POA: Insufficient documentation

## 2020-11-19 DIAGNOSIS — D631 Anemia in chronic kidney disease: Secondary | ICD-10-CM | POA: Insufficient documentation

## 2020-11-19 DIAGNOSIS — E860 Dehydration: Secondary | ICD-10-CM

## 2020-11-19 DIAGNOSIS — R270 Ataxia, unspecified: Secondary | ICD-10-CM | POA: Diagnosis not present

## 2020-11-19 DIAGNOSIS — Z87891 Personal history of nicotine dependence: Secondary | ICD-10-CM | POA: Insufficient documentation

## 2020-11-19 DIAGNOSIS — Z86718 Personal history of other venous thrombosis and embolism: Secondary | ICD-10-CM | POA: Insufficient documentation

## 2020-11-19 DIAGNOSIS — M4802 Spinal stenosis, cervical region: Secondary | ICD-10-CM | POA: Diagnosis not present

## 2020-11-19 DIAGNOSIS — E538 Deficiency of other specified B group vitamins: Secondary | ICD-10-CM | POA: Diagnosis not present

## 2020-11-19 DIAGNOSIS — Z7901 Long term (current) use of anticoagulants: Secondary | ICD-10-CM | POA: Diagnosis not present

## 2020-11-19 DIAGNOSIS — D509 Iron deficiency anemia, unspecified: Secondary | ICD-10-CM

## 2020-11-19 DIAGNOSIS — Z79899 Other long term (current) drug therapy: Secondary | ICD-10-CM | POA: Insufficient documentation

## 2020-11-19 DIAGNOSIS — M6281 Muscle weakness (generalized): Secondary | ICD-10-CM | POA: Diagnosis not present

## 2020-11-19 DIAGNOSIS — I82402 Acute embolism and thrombosis of unspecified deep veins of left lower extremity: Secondary | ICD-10-CM | POA: Diagnosis not present

## 2020-11-19 LAB — CBC WITH DIFFERENTIAL/PLATELET
Abs Immature Granulocytes: 0.01 10*3/uL (ref 0.00–0.07)
Basophils Absolute: 0 10*3/uL (ref 0.0–0.1)
Basophils Relative: 1 %
Eosinophils Absolute: 0.4 10*3/uL (ref 0.0–0.5)
Eosinophils Relative: 6 %
HCT: 33.1 % — ABNORMAL LOW (ref 36.0–46.0)
Hemoglobin: 10.1 g/dL — ABNORMAL LOW (ref 12.0–15.0)
Immature Granulocytes: 0 %
Lymphocytes Relative: 34 %
Lymphs Abs: 2 10*3/uL (ref 0.7–4.0)
MCH: 28.7 pg (ref 26.0–34.0)
MCHC: 30.5 g/dL (ref 30.0–36.0)
MCV: 94 fL (ref 80.0–100.0)
Monocytes Absolute: 0.3 10*3/uL (ref 0.1–1.0)
Monocytes Relative: 5 %
Neutro Abs: 3.1 10*3/uL (ref 1.7–7.7)
Neutrophils Relative %: 54 %
Platelets: 199 10*3/uL (ref 150–400)
RBC: 3.52 MIL/uL — ABNORMAL LOW (ref 3.87–5.11)
RDW: 15.4 % (ref 11.5–15.5)
WBC: 5.8 10*3/uL (ref 4.0–10.5)
nRBC: 0 % (ref 0.0–0.2)

## 2020-11-19 MED ORDER — EPOETIN ALFA-EPBX 10000 UNIT/ML IJ SOLN
INTRAMUSCULAR | Status: AC
  Filled 2020-11-19: qty 1

## 2020-11-19 MED ORDER — EPOETIN ALFA-EPBX 10000 UNIT/ML IJ SOLN
10000.0000 [IU] | Freq: Once | INTRAMUSCULAR | Status: AC
  Administered 2020-11-19: 10000 [IU] via SUBCUTANEOUS
  Filled 2020-11-19: qty 1

## 2020-11-19 NOTE — Patient Instructions (Signed)
Traci Parker  Discharge Instructions: Thank you for choosing [facility] to provide your oncology and hematology care.  If you have a lab appointment with the [facility], please come in thru the Main Entrance and check in at the main information desk.  Wear comfortable clothing and clothing appropriate for easy access to any Portacath or PICC line.   We strive to give you quality time with your provider. You may need to reschedule your appointment if you arrive late (15 or more minutes).  Arriving late affects you and other patients whose appointments are after yours.  Also, if you miss three or more appointments without notifying the office, you may be dismissed from the clinic at the provider's discretion.      For prescription refill requests, have your pharmacy contact our office and allow 72 hours for refills to be completed.    Hemoglobin 10.1.  Retacrit 10000 units given.  Follow up as scheduled.  Please call the clinic if you have any questions or concerns.    To help prevent nausea and vomiting after your treatment, we encourage you to take your nausea medication as directed.  BELOW ARE SYMPTOMS THAT SHOULD BE REPORTED IMMEDIATELY: . *FEVER GREATER THAN 100.4 F (38 C) OR HIGHER . *CHILLS OR SWEATING . *NAUSEA AND VOMITING THAT IS NOT CONTROLLED WITH YOUR NAUSEA MEDICATION . *UNUSUAL SHORTNESS OF BREATH . *UNUSUAL BRUISING OR BLEEDING . *URINARY PROBLEMS (pain or burning when urinating, or frequent urination) . *BOWEL PROBLEMS (unusual diarrhea, constipation, pain near the anus) . TENDERNESS IN MOUTH AND THROAT WITH OR WITHOUT PRESENCE OF ULCERS (sore throat, sores in mouth, or a toothache) . UNUSUAL RASH, SWELLING OR PAIN  . UNUSUAL VAGINAL DISCHARGE OR ITCHING   Items with * indicate a potential emergency and should be followed up as soon as possible or go to the Emergency Department if any problems should occur.  Please show the CHEMOTHERAPY ALERT  CARD or IMMUNOTHERAPY ALERT CARD at check-in to the Emergency Department and triage nurse.  Should you have questions after your visit or need to cancel or reschedule your appointment, please contact [facility] [phone]  and follow the prompts.  Office hours are 8:00 a.m. to 4:30 p.m. Monday - Friday. Please note that voicemails left after 4:00 p.m. may not be returned until the following business day.  We are closed weekends and major holidays. You have access to a nurse at all times for urgent questions. Please call the main number to the clinic [phone] and follow the prompts.  For any non-urgent questions, you may also contact your provider using MyChart. We now offer e-Visits for anyone 2 and older to request care online for non-urgent symptoms. For details visit [email or web address].   Also download the MyChart app! Go to the app store, search "MyChart", open the app, select [facility], and log in with your MyChart username and password.  Due to Covid, a mask is required upon entering the hospital/clinic. If you do not have a mask, one will be given to you upon arrival. For doctor visits, patients may have 1 support person aged 12 or older with them. For treatment visits, patients cannot have anyone with them due to current Covid guidelines and our immunocompromised population.

## 2020-11-19 NOTE — Progress Notes (Signed)
Pt here for retacrit 10,000 units.  Last given 11/05/20.  Hemoglobin is 10.1.  Vital signs stable for treatment.   Traci Parker presents today for injection per the provider's orders.  retacrit 10000 units in left arm administration without incident; injection site WNL; see MAR for injection details.  Patient tolerated procedure well and without incident.  No questions or complaints noted at this time.  Stable during and after injection.  AVS reviewed.  Discharged in stable condition via wheelchair.

## 2020-11-20 DIAGNOSIS — I82402 Acute embolism and thrombosis of unspecified deep veins of left lower extremity: Secondary | ICD-10-CM | POA: Diagnosis not present

## 2020-11-20 DIAGNOSIS — M4802 Spinal stenosis, cervical region: Secondary | ICD-10-CM | POA: Diagnosis not present

## 2020-11-20 DIAGNOSIS — R262 Difficulty in walking, not elsewhere classified: Secondary | ICD-10-CM | POA: Diagnosis not present

## 2020-11-20 DIAGNOSIS — R270 Ataxia, unspecified: Secondary | ICD-10-CM | POA: Diagnosis not present

## 2020-11-20 DIAGNOSIS — M6281 Muscle weakness (generalized): Secondary | ICD-10-CM | POA: Diagnosis not present

## 2020-11-21 DIAGNOSIS — M6281 Muscle weakness (generalized): Secondary | ICD-10-CM | POA: Diagnosis not present

## 2020-11-21 DIAGNOSIS — R262 Difficulty in walking, not elsewhere classified: Secondary | ICD-10-CM | POA: Diagnosis not present

## 2020-11-21 DIAGNOSIS — M4802 Spinal stenosis, cervical region: Secondary | ICD-10-CM | POA: Diagnosis not present

## 2020-11-21 DIAGNOSIS — I82402 Acute embolism and thrombosis of unspecified deep veins of left lower extremity: Secondary | ICD-10-CM | POA: Diagnosis not present

## 2020-11-21 DIAGNOSIS — R270 Ataxia, unspecified: Secondary | ICD-10-CM | POA: Diagnosis not present

## 2020-11-22 ENCOUNTER — Other Ambulatory Visit (HOSPITAL_COMMUNITY)
Admission: RE | Admit: 2020-11-22 | Discharge: 2020-11-22 | Disposition: A | Discharge status: Home / Self Care | Payer: Medicare Other | Source: Skilled Nursing Facility | Attending: Adult Health | Admitting: Adult Health

## 2020-11-22 DIAGNOSIS — U071 COVID-19: Secondary | ICD-10-CM | POA: Insufficient documentation

## 2020-11-22 DIAGNOSIS — D509 Iron deficiency anemia, unspecified: Secondary | ICD-10-CM | POA: Diagnosis not present

## 2020-11-22 DIAGNOSIS — M4802 Spinal stenosis, cervical region: Secondary | ICD-10-CM | POA: Insufficient documentation

## 2020-11-22 DIAGNOSIS — I82402 Acute embolism and thrombosis of unspecified deep veins of left lower extremity: Secondary | ICD-10-CM | POA: Insufficient documentation

## 2020-11-22 DIAGNOSIS — R270 Ataxia, unspecified: Secondary | ICD-10-CM | POA: Diagnosis not present

## 2020-11-22 LAB — OCCULT BLOOD X 1 CARD TO LAB, STOOL: Fecal Occult Bld: POSITIVE — AB

## 2020-11-25 DIAGNOSIS — M6281 Muscle weakness (generalized): Secondary | ICD-10-CM | POA: Diagnosis not present

## 2020-11-25 DIAGNOSIS — I82402 Acute embolism and thrombosis of unspecified deep veins of left lower extremity: Secondary | ICD-10-CM | POA: Diagnosis not present

## 2020-11-25 DIAGNOSIS — M4802 Spinal stenosis, cervical region: Secondary | ICD-10-CM | POA: Diagnosis not present

## 2020-11-25 DIAGNOSIS — R270 Ataxia, unspecified: Secondary | ICD-10-CM | POA: Diagnosis not present

## 2020-11-25 DIAGNOSIS — R262 Difficulty in walking, not elsewhere classified: Secondary | ICD-10-CM | POA: Diagnosis not present

## 2020-11-25 NOTE — Patient Instructions (Signed)
Traci Parker  11/25/2020     @PREFPERIOPPHARMACY @   Your procedure is scheduled on  11/29/2020.   Report to [name] at  [place]  A.M.   Call this number if you have problems the morning of surgery:  [phone]   Remember:  Follow the diet and prep instructions given to you by the office.                      Take these medicines the morning of surgery with A SIP OF WATER  Amlodipine, gabapentin, reglan, prilosec, zofran(if needed).  DO NOT take any medications for diabetes the morning of your procedure.   Use your inhaler before your come and bring your rescue inhaler with you.    Brush your teeth.  Do not wear jewelry, make-up or nail polish.  Do not wear lotions, powders, or perfumes, or deodorant.  Do not shave 48 hours prior to surgery.  Men may shave face and neck.  Do not bring valuables to the hospital.  [facility] is not responsible for any belongings or valuables.  Contacts, dentures or bridgework may not be worn into surgery.  Leave your suitcase in the car.  After surgery it may be brought to your room.  For patients admitted to the hospital, discharge time will be determined by your treatment team.  Patients discharged the day of surgery will not be allowed to drive home and must have someone with them for 24 hours.   Special instructions:  DO NOT smoke tobacco or vape for 24 hours before your procedure.    Please read over the following fact sheets that you were given. Anesthesia Post-op Instructions and Care and Recovery After Surgery       Colonoscopy, Adult, Care After This sheet gives you information about how to care for yourself after your procedure. Your health care provider may also give you more specific instructions. If you have problems or questions, contact your health care provider. What can I expect after the procedure? After the procedure, it is common to have:  A small amount of blood in your stool for 24 hours after the  procedure.  Some gas.  Mild cramping or bloating of your abdomen. Follow these instructions at home: Eating and drinking  Drink enough fluid to keep your urine pale yellow.  Follow instructions from your health care provider about eating or drinking restrictions.  Resume your normal diet as instructed by your health care provider. Avoid heavy or fried foods that are hard to digest.   Activity  Rest as told by your health care provider.  Avoid sitting for a long time without moving. Get up to take short walks every 1-2 hours. This is important to improve blood flow and breathing. Ask for help if you feel weak or unsteady.  Return to your normal activities as told by your health care provider. Ask your health care provider what activities are safe for you. Managing cramping and bloating  Try walking around when you have cramps or feel bloated.  Apply heat to your abdomen as told by your health care provider. Use the heat source that your health care provider recommends, such as a moist heat pack or a heating pad. ? Place a towel between your skin and the heat source. ? Leave the heat on for 20-30 minutes. ? Remove the heat if your skin turns bright red. This is especially important if you are unable to  feel pain, heat, or cold. You may have a greater risk of getting burned.   General instructions  If you were given a sedative during the procedure, it can affect you for several hours. Do not drive or operate machinery until your health care provider says that it is safe.  For the first 24 hours after the procedure: ? Do not sign important documents. ? Do not drink alcohol. ? Do your regular daily activities at a slower pace than normal. ? Eat soft foods that are easy to digest.  Take over-the-counter and prescription medicines only as told by your health care provider.  Keep all follow-up visits as told by your health care provider. This is important. Contact a health care  provider if:  You have blood in your stool 2-3 days after the procedure. Get help right away if you have:  More than a small spotting of blood in your stool.  Large blood clots in your stool.  Swelling of your abdomen.  Nausea or vomiting.  A fever.  Increasing pain in your abdomen that is not relieved with medicine. Summary  After the procedure, it is common to have a small amount of blood in your stool. You may also have mild cramping and bloating of your abdomen.  If you were given a sedative during the procedure, it can affect you for several hours. Do not drive or operate machinery until your health care provider says that it is safe.  Get help right away if you have a lot of blood in your stool, nausea or vomiting, a fever, or increased pain in your abdomen. This information is not intended to replace advice given to you by your health care provider. Make sure you discuss any questions you have with your health care provider. Document Revised: 06/23/2019 Document Reviewed: 01/23/2019 Elsevier Patient Education  2021 [place] After This sheet gives you information about how to care for yourself after your procedure. Your health care provider may also give you more specific instructions. If you have problems or questions, contact your health care provider. What can I expect after the procedure? After the procedure, it is common to have:  Tiredness.  Forgetfulness about what happened after the procedure.  Impaired judgment for important decisions.  Nausea or vomiting.  Some difficulty with balance. Follow these instructions at home: For the time period you were told by your health care provider:  Rest as needed.  Do not participate in activities where you could fall or become injured.  Do not drive or use machinery.  Do not drink alcohol.  Do not take sleeping pills or medicines that cause drowsiness.  Do not make  important decisions or sign legal documents.  Do not take care of children on your own.      Eating and drinking  Follow the diet that is recommended by your health care provider.  Drink enough fluid to keep your urine pale yellow.  If you vomit: ? Drink water, juice, or soup when you can drink without vomiting. ? Make sure you have little or no nausea before eating solid foods. General instructions  Have a responsible adult stay with you for the time you are told. It is important to have someone help care for you until you are awake and alert.  Take over-the-counter and prescription medicines only as told by your health care provider.  If you have sleep apnea, surgery and certain medicines can increase your risk for breathing  problems. Follow instructions from your health care provider about wearing your sleep device: ? Anytime you are sleeping, including during daytime naps. ? While taking prescription pain medicines, sleeping medicines, or medicines that make you drowsy.  Avoid smoking.  Keep all follow-up visits as told by your health care provider. This is important. Contact a health care provider if:  You keep feeling nauseous or you keep vomiting.  You feel light-headed.  You are still sleepy or having trouble with balance after 24 hours.  You develop a rash.  You have a fever.  You have redness or swelling around the IV site. Get help right away if:  You have trouble breathing.  You have new-onset confusion at home. Summary  For several hours after your procedure, you may feel tired. You may also be forgetful and have poor judgment.  Have a responsible adult stay with you for the time you are told. It is important to have someone help care for you until you are awake and alert.  Rest as told. Do not drive or operate machinery. Do not drink alcohol or take sleeping pills.  Get help right away if you have trouble breathing, or if you suddenly become  confused. This information is not intended to replace advice given to you by your health care provider. Make sure you discuss any questions you have with your health care provider. Document Revised: 03/14/2020 Document Reviewed: 06/01/2019 Elsevier Patient Education  2021 [employer].

## 2020-11-26 ENCOUNTER — Other Ambulatory Visit: Payer: Self-pay

## 2020-11-26 ENCOUNTER — Encounter (HOSPITAL_COMMUNITY)
Admit: 2020-11-26 | Discharge: 2020-11-26 | Disposition: A | Discharge status: Home / Self Care | Payer: Medicare Other | Attending: Gastroenterology | Admitting: Gastroenterology

## 2020-11-26 ENCOUNTER — Encounter (HOSPITAL_COMMUNITY): Payer: Self-pay

## 2020-11-26 DIAGNOSIS — Z01812 Encounter for preprocedural laboratory examination: Secondary | ICD-10-CM | POA: Insufficient documentation

## 2020-11-26 HISTORY — DX: Polyneuropathy, unspecified: G62.9

## 2020-11-26 HISTORY — DX: Unspecified osteoarthritis, unspecified site: M19.90

## 2020-11-26 HISTORY — DX: Gastro-esophageal reflux disease without esophagitis: K21.9

## 2020-11-26 HISTORY — DX: Cerebral infarction, unspecified: I63.9

## 2020-11-26 LAB — BASIC METABOLIC PANEL
Anion gap: 9 (ref 5–15)
BUN: 21 mg/dL (ref 8–23)
CO2: 27 mmol/L (ref 22–32)
Calcium: 9.4 mg/dL (ref 8.9–10.3)
Chloride: 102 mmol/L (ref 98–111)
Creatinine, Ser: 1.14 mg/dL — ABNORMAL HIGH (ref 0.44–1.00)
GFR, Estimated: 50 mL/min — ABNORMAL LOW (ref >60)
Glucose, Bld: 118 mg/dL — ABNORMAL HIGH (ref 70–99)
Potassium: 4.2 mmol/L (ref 3.5–5.1)
Sodium: 138 mmol/L (ref 135–145)

## 2020-11-27 DIAGNOSIS — M6281 Muscle weakness (generalized): Secondary | ICD-10-CM | POA: Diagnosis not present

## 2020-11-27 DIAGNOSIS — R270 Ataxia, unspecified: Secondary | ICD-10-CM | POA: Diagnosis not present

## 2020-11-27 DIAGNOSIS — R262 Difficulty in walking, not elsewhere classified: Secondary | ICD-10-CM | POA: Diagnosis not present

## 2020-11-27 DIAGNOSIS — I82402 Acute embolism and thrombosis of unspecified deep veins of left lower extremity: Secondary | ICD-10-CM | POA: Diagnosis not present

## 2020-11-27 DIAGNOSIS — M4802 Spinal stenosis, cervical region: Secondary | ICD-10-CM | POA: Diagnosis not present

## 2020-11-29 ENCOUNTER — Ambulatory Visit (HOSPITAL_COMMUNITY): Payer: Medicare Other | Admitting: Certified Registered Nurse Anesthetist

## 2020-11-29 ENCOUNTER — Other Ambulatory Visit (HOSPITAL_COMMUNITY): Payer: Medicare Other

## 2020-11-29 ENCOUNTER — Other Ambulatory Visit (HOSPITAL_COMMUNITY)
Admit: 2020-11-29 | Discharge: 2020-11-29 | Disposition: A | Discharge status: Home / Self Care | Payer: Medicare Other | Attending: Gastroenterology | Admitting: Gastroenterology

## 2020-11-29 ENCOUNTER — Encounter (HOSPITAL_COMMUNITY): Payer: Self-pay | Admitting: Gastroenterology

## 2020-11-29 ENCOUNTER — Inpatient Hospital Stay
Admission: RE | Admit: 2020-11-29 | Discharge: 2022-08-03 | Disposition: A | Discharge status: Skilled Nursing Facility | Payer: Medicare Other | Source: Ambulatory Visit | Attending: Internal Medicine | Admitting: Internal Medicine

## 2020-11-29 ENCOUNTER — Ambulatory Visit (HOSPITAL_COMMUNITY)
Admission: RE | Admit: 2020-11-29 | Discharge: 2020-11-29 | Disposition: A | Discharge status: Skilled Nursing Facility | Payer: Medicare Other | Attending: Gastroenterology | Admitting: Gastroenterology

## 2020-11-29 ENCOUNTER — Encounter (HOSPITAL_COMMUNITY)
Admission: RE | Disposition: A | Discharge status: Skilled Nursing Facility | Payer: Self-pay | Source: Home / Self Care | Attending: Gastroenterology

## 2020-11-29 DIAGNOSIS — Z91040 Latex allergy status: Secondary | ICD-10-CM | POA: Diagnosis not present

## 2020-11-29 DIAGNOSIS — R151 Fecal smearing: Secondary | ICD-10-CM | POA: Insufficient documentation

## 2020-11-29 DIAGNOSIS — Z881 Allergy status to other antibiotic agents status: Secondary | ICD-10-CM | POA: Insufficient documentation

## 2020-11-29 DIAGNOSIS — Z538 Procedure and treatment not carried out for other reasons: Secondary | ICD-10-CM | POA: Insufficient documentation

## 2020-11-29 DIAGNOSIS — R112 Nausea with vomiting, unspecified: Secondary | ICD-10-CM | POA: Insufficient documentation

## 2020-11-29 DIAGNOSIS — Z86718 Personal history of other venous thrombosis and embolism: Secondary | ICD-10-CM

## 2020-11-29 DIAGNOSIS — E1122 Type 2 diabetes mellitus with diabetic chronic kidney disease: Secondary | ICD-10-CM | POA: Diagnosis not present

## 2020-11-29 DIAGNOSIS — N189 Chronic kidney disease, unspecified: Secondary | ICD-10-CM | POA: Insufficient documentation

## 2020-11-29 DIAGNOSIS — Z91018 Allergy to other foods: Secondary | ICD-10-CM | POA: Insufficient documentation

## 2020-11-29 DIAGNOSIS — Z681 Body mass index (BMI) 19 or less, adult: Secondary | ICD-10-CM | POA: Diagnosis not present

## 2020-11-29 DIAGNOSIS — R197 Diarrhea, unspecified: Secondary | ICD-10-CM | POA: Insufficient documentation

## 2020-11-29 DIAGNOSIS — Z1231 Encounter for screening mammogram for malignant neoplasm of breast: Principal | ICD-10-CM

## 2020-11-29 DIAGNOSIS — J449 Chronic obstructive pulmonary disease, unspecified: Secondary | ICD-10-CM | POA: Insufficient documentation

## 2020-11-29 DIAGNOSIS — I129 Hypertensive chronic kidney disease with stage 1 through stage 4 chronic kidney disease, or unspecified chronic kidney disease: Secondary | ICD-10-CM | POA: Diagnosis not present

## 2020-11-29 DIAGNOSIS — R634 Abnormal weight loss: Secondary | ICD-10-CM | POA: Diagnosis not present

## 2020-11-29 DIAGNOSIS — Z888 Allergy status to other drugs, medicaments and biological substances status: Secondary | ICD-10-CM | POA: Diagnosis not present

## 2020-11-29 DIAGNOSIS — K6289 Other specified diseases of anus and rectum: Secondary | ICD-10-CM | POA: Diagnosis not present

## 2020-11-29 DIAGNOSIS — Z87891 Personal history of nicotine dependence: Secondary | ICD-10-CM | POA: Diagnosis not present

## 2020-11-29 HISTORY — PX: COLONOSCOPY WITH PROPOFOL: SHX5780

## 2020-11-29 LAB — GLUCOSE, CAPILLARY: Glucose-Capillary: 99 mg/dL (ref 70–99)

## 2020-11-29 SURGERY — COLONOSCOPY WITH PROPOFOL
Anesthesia: General

## 2020-11-29 MED ORDER — PROPOFOL 10 MG/ML IV BOLUS
INTRAVENOUS | Status: DC | PRN
  Administered 2020-11-29: 30 mg via INTRAVENOUS

## 2020-11-29 MED ORDER — PROPOFOL 500 MG/50ML IV EMUL
INTRAVENOUS | Status: DC | PRN
  Administered 2020-11-29: 125 ug/kg/min via INTRAVENOUS

## 2020-11-29 MED ORDER — LIDOCAINE HCL (CARDIAC) PF 100 MG/5ML IV SOSY
PREFILLED_SYRINGE | INTRAVENOUS | Status: DC | PRN
  Administered 2020-11-29: 40 mg via INTRATRACHEAL

## 2020-11-29 MED ORDER — SODIUM CHLORIDE 0.9 % IV SOLN: INTRAVENOUS | Status: DC

## 2020-11-29 NOTE — Progress Notes (Addendum)
Report regarding colonoscopy called to [place] at [facility].  Awaiting transport. Dr. Jenetta Downer discussed results with pt's daughter, Coryn Mosso.  Pt's cell phone given to Shonda.

## 2020-11-29 NOTE — Discharge Instructions (Signed)
Monitored Anesthesia Care, Care After This sheet gives you information about how to care for yourself after your procedure. Your health care provider may also give you more specific instructions. If you have problems or questions, contact your health care provider. What can I expect after the procedure? After the procedure, it is common to have:  Tiredness.  Forgetfulness about what happened after the procedure.  Impaired judgment for important decisions.  Nausea or vomiting.  Some difficulty with balance. Follow these instructions at home: For the time period you were told by your health care provider:  Rest as needed.  Do not participate in activities where you could fall or become injured.  Do not drive or use machinery.  Do not drink alcohol.  Do not take sleeping pills or medicines that cause drowsiness.  Do not make important decisions or sign legal documents.  Do not take care of children on your own.      Eating and drinking  Follow the diet that is recommended by your health care provider.  Drink enough fluid to keep your urine pale yellow.  If you vomit: ? Drink water, juice, or soup when you can drink without vomiting. ? Make sure you have little or no nausea before eating solid foods. General instructions  Have a responsible adult stay with you for the time you are told. It is important to have someone help care for you until you are awake and alert.  Take over-the-counter and prescription medicines only as told by your health care provider.  If you have sleep apnea, surgery and certain medicines can increase your risk for breathing problems. Follow instructions from your health care provider about wearing your sleep device: ? Anytime you are sleeping, including during daytime naps. ? While taking prescription pain medicines, sleeping medicines, or medicines that make you drowsy.  Avoid smoking.  Keep all follow-up visits as told by your health care  provider. This is important. Contact a health care provider if:  You keep feeling nauseous or you keep vomiting.  You feel light-headed.  You are still sleepy or having trouble with balance after 24 hours.  You develop a rash.  You have a fever.  You have redness or swelling around the IV site. Get help right away if:  You have trouble breathing.  You have new-onset confusion at home. Summary  For several hours after your procedure, you may feel tired. You may also be forgetful and have poor judgment.  Have a responsible adult stay with you for the time you are told. It is important to have someone help care for you until you are awake and alert.  Rest as told. Do not drive or operate machinery. Do not drink alcohol or take sleeping pills.  Get help right away if you have trouble breathing, or if you suddenly become confused. This information is not intended to replace advice given to you by your health care provider. Make sure you discuss any questions you have with your health care provider. Document Revised: 03/14/2020 Document Reviewed: 06/01/2019 Elsevier Patient Education  2021 [place]. Colonoscopy, Adult, Care After This sheet gives you information about how to care for yourself after your procedure. Your doctor may also give you more specific instructions. If you have problems or questions, call your doctor. What can I expect after the procedure? After the procedure, it is common to have:  A small amount of blood in your poop (stool) for 24 hours.  Some gas.  Mild cramping  or bloating in your belly (abdomen). Follow these instructions at home: Eating and drinking  Drink enough fluid to keep your pee (urine) pale yellow.  Follow instructions from your doctor about what you cannot eat or drink.  Return to your normal diet as told by your doctor. Avoid heavy or fried foods that are hard to digest.   Activity  Rest as told by your doctor.  Do not sit for  a long time without moving. Get up to take short walks every 1-2 hours. This is important. Ask for help if you feel weak or unsteady.  Return to your normal activities as told by your doctor. Ask your doctor what activities are safe for you. To help cramping and bloating:  Try walking around.  Put heat on your belly as told by your doctor. Use the heat source that your doctor recommends, such as a moist heat pack or a heating pad. ? Put a towel between your skin and the heat source. ? Leave the heat on for 20-30 minutes. ? Remove the heat if your skin turns bright red. This is very important if you are unable to feel pain, heat, or cold. You may have a greater risk of getting burned.   General instructions  If you were given a medicine to help you relax (sedative) during your procedure, it can affect you for many hours. Do not drive or use machinery until your doctor says that it is safe.  For the first 24 hours after the procedure: ? Do not sign important documents. ? Do not drink alcohol. ? Do your daily activities more slowly than normal. ? Eat foods that are soft and easy to digest.  Take over-the-counter or prescription medicines only as told by your doctor.  Keep all follow-up visits as told by your doctor. This is important. Contact a doctor if:  You have blood in your poop 2-3 days after the procedure. Get help right away if:  You have more than a small amount of blood in your poop.  You see large clumps of tissue (blood clots) in your poop.  Your belly is swollen.  You feel like you may vomit (nauseous).  You vomit.  You have a fever.  You have belly pain that gets worse, and medicine does not help your pain. Summary  After the procedure, it is common to have a small amount of blood in your poop. You may also have mild cramping and bloating in your belly.  If you were given a medicine to help you relax (sedative) during your procedure, it can affect you for many  hours. Do not drive or use machinery until your doctor says that it is safe.  Get help right away if you have a lot of blood in your poop, feel like you may vomit, have a fever, or have more belly pain. This information is not intended to replace advice given to you by your health care provider. Make sure you discuss any questions you have with your health care provider. Document Revised: 05/05/2019 Document Reviewed: 01/23/2019 Elsevier Patient Education  [place].

## 2020-11-29 NOTE — Interval H&P Note (Signed)
History and Physical Interval Note:  11/29/2020 1:39 PM Traci Parker is a 75 y.o. 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  Patient said that she is having 2 bowel movements every day, occasionally has episodes of medication when she tries to urinate which is not intentional.   BP (!) 154/113   Pulse (!) 120   Temp 98.4 F (36.9 C) (Oral)   Resp (!) 22   SpO2 98%  GENERAL: The patient is AO x3, in no acute distress. HEENT: Head is normocephalic and atraumatic. EOMI are intact. Mouth is well hydrated and without lesions. NECK: Supple. No masses LUNGS: Clear to auscultation. No presence of rhonchi/wheezing/rales. Adequate chest expansion HEART: RRR, normal s1 and s2. ABDOMEN: Soft, nontender, no guarding, no peritoneal signs, and nondistended. BS +. No masses. EXTREMITIES: Without any cyanosis, clubbing, rash, lesions or edema. NEUROLOGIC: AOx3, no focal motor deficit. SKIN: no jaundice, no rashes   [name]  has presented today for surgery, with the diagnosis of Diarrhea.  The various methods of treatment have been discussed with the patient and family. After consideration of risks, benefits and other options for treatment, the patient has consented to  Procedure(s) with comments: COLONOSCOPY WITH PROPOFOL (N/A) - 1:35 patient is in the [facility]) as a surgical intervention.  The patient's history has been reviewed, patient examined, no change in status, stable for surgery.  I have reviewed the patient's chart and labs.  Questions were answered to the patient's satisfaction.     [name]

## 2020-11-29 NOTE — Transfer of Care (Signed)
Immediate Anesthesia Transfer of Care Note  Patient: Traci Parker  Procedure(s) Performed: COLONOSCOPY WITH PROPOFOL (N/A )  Patient Location: PACU  Anesthesia Type:General  Level of Consciousness: awake  Airway & Oxygen Therapy: Patient Spontanous Breathing  Post-op Assessment: Report given to RN and Post -op Vital signs reviewed and stable  Post vital signs: Reviewed and stable  Last Vitals:  Vitals Value Taken Time  BP    Temp    Pulse    Resp    SpO2      Last Pain:  Vitals:   11/29/20 1352  TempSrc:   PainSc: 0-No pain         Complications: No complications documented.

## 2020-11-29 NOTE — Op Note (Signed)
[facility] Patient Name: Traci Parker Procedure Date: 11/29/2020 1:36 PM MRN: [id number] Date of Birth: July 29, 1945 Attending MD: [name] ,  CSN: [id number] Age: 75 Admit Type: Outpatient Procedure:                Flexible Sigmoidoscopy Indications:              Diarrhea, fecal incontinence Providers:                [name], Herbert Pun,                            Technician Referring MD:              Medicines:                Monitored Anesthesia Care Complications:            No immediate complications. Estimated Blood Loss:     Estimated blood loss: none. Procedure:                Pre-Anesthesia Assessment:                           - Prior to the procedure, a History and Physical                            was performed, and patient medications, allergies                            and sensitivities were reviewed. The patient's                            tolerance of previous anesthesia was reviewed.                           - The risks and benefits of the procedure and the                            sedation options and risks were discussed with the                            patient. All questions were answered and informed                            consent was obtained.                           - ASA Grade Assessment: II - A patient with mild                            systemic disease.                           After obtaining informed consent, the scope was                            passed under direct vision. The PCF-[id number]                            (  [id number]) scope was introduced through the anus and                            advanced to the the rectosigmoid junction. The                            colonoscopy was performed without difficulty. The                            patient tolerated the procedure well. The quality                            of the bowel preparation was good. After obtaining                             informed consent, the scope was passed under direct                            vision. Scope In: 1:56:06 PM Scope Out: 1:57:08 PM Total Procedure Duration: 0 hours 1 minute 2 seconds  Findings:      The digital rectal exam findings include decreased sphincter tone. There       was presence of stool.      Extensive amounts of stool was found in the rectum and in the       recto-sigmoid colon, precluding visualization. Impression:               - Decreased sphincter tone found on digital rectal                            exam.                           - Stool in the rectum and in the recto-sigmoid                            colon.                           - No specimens collected. Moderate Sedation:      Per Anesthesia Care Recommendation:           - Discharge patient to home (ambulatory).                           - Resume previous diet.                           - Await pathology results.                           - Repeat colonoscopy for surveillance based on                            pathology results.                           - Consider  anorectal manometry if symptoms persist,                            will discuss this with patient and family member. Procedure Code(s):        --- Professional ---                           [phone], 74, Sigmoidoscopy, flexible; diagnostic,                            including collection of specimen(s) by brushing or                            washing, when performed (separate procedure) Diagnosis Code(s):        --- Professional ---                           K62.89, Other specified diseases of anus and rectum                           R19.7, Diarrhea, unspecified CPT copyright 2019 American Medical Association. All rights reserved. The codes documented in this report are preliminary and upon coder review may  be revised to meet current compliance requirements. [name], MD [name],  11/29/2020 2:04:17 PM This report has been  signed electronically. Number of Addenda: 0

## 2020-11-29 NOTE — Anesthesia Postprocedure Evaluation (Signed)
Anesthesia Post Note  Patient: Traci Parker  Procedure(s) Performed: COLONOSCOPY WITH PROPOFOL (N/A )  Patient location during evaluation: Phase II Anesthesia Type: General Level of consciousness: awake Pain management: pain level controlled Vital Signs Assessment: post-procedure vital signs reviewed and stable Respiratory status: spontaneous breathing and respiratory function stable Cardiovascular status: blood pressure returned to baseline and stable Postop Assessment: no headache and no apparent nausea or vomiting Anesthetic complications: no Comments: Late entry   No complications documented.   Last Vitals:  Vitals:   11/29/20 1232 11/29/20 1404  BP: (!) 154/113 109/67  Pulse: (!) 120   Resp: (!) 22 15  Temp: 36.9 C 36.7 C  SpO2: 98% 100%    Last Pain:  Vitals:   11/29/20 1404  TempSrc: Oral  PainSc: 0-No pain                 Louann Sjogren

## 2020-11-29 NOTE — Anesthesia Preprocedure Evaluation (Signed)
Anesthesia Evaluation  Patient identified by MRN, date of birth, ID band Patient awake    Reviewed: Allergy & Precautions, H&P , NPO status , Patient's Chart, lab work & pertinent test results, reviewed documented beta blocker date and time   Airway Mallampati: II  TM Distance: >3 FB Neck ROM: full    Dental no notable dental hx.    Pulmonary asthma , COPD, former smoker,    Pulmonary exam normal breath sounds clear to auscultation       Cardiovascular Exercise Tolerance: Good hypertension, negative cardio ROS   Rhythm:regular Rate:Normal     Neuro/Psych  Neuromuscular disease CVA, Residual Symptoms negative psych ROS   GI/Hepatic Neg liver ROS, GERD  Medicated,  Endo/Other  negative endocrine ROSdiabetes  Renal/GU CRF and ESRFRenal disease  negative genitourinary   Musculoskeletal   Abdominal   Peds  Hematology  (+) Blood dyscrasia, anemia ,   Anesthesia Other Findings   Reproductive/Obstetrics negative OB ROS                             Anesthesia Physical Anesthesia Plan  ASA: III  Anesthesia Plan: General   Post-op Pain Management:    Induction:   PONV Risk Score and Plan: Propofol infusion  Airway Management Planned:   Additional Equipment:   Intra-op Plan:   Post-operative Plan:   Informed Consent: I have reviewed the patients History and Physical, chart, labs and discussed the procedure including the risks, benefits and alternatives for the proposed anesthesia with the patient or authorized representative who has indicated his/her understanding and acceptance.     Dental Advisory Given  Plan Discussed with: CRNA  Anesthesia Plan Comments:         Anesthesia Quick Evaluation

## 2020-12-02 ENCOUNTER — Telehealth (INDEPENDENT_AMBULATORY_CARE_PROVIDER_SITE_OTHER): Payer: Self-pay

## 2020-12-02 NOTE — Telephone Encounter (Signed)
Patient daughter Rogelia Boga [phone] called and states the patient had a Tcs on 11/29/2020 and her Eliquis was stopped for this procedure. She states the patient is a patient at [facility] and they need an order to resume the Eliquis. Please advise.[place] [phone]

## 2020-12-02 NOTE — Telephone Encounter (Signed)
Spoke with the [place] has been restarted

## 2020-12-02 NOTE — Telephone Encounter (Signed)
Noted. I have called the patient daughter and left her a message that it had been resumed.

## 2020-12-03 ENCOUNTER — Inpatient Hospital Stay (HOSPITAL_COMMUNITY): Payer: Medicare Other

## 2020-12-03 ENCOUNTER — Inpatient Hospital Stay (HOSPITAL_BASED_OUTPATIENT_CLINIC_OR_DEPARTMENT_OTHER): Payer: Medicare Other | Admitting: Physician Assistant

## 2020-12-03 ENCOUNTER — Other Ambulatory Visit: Payer: Self-pay

## 2020-12-03 ENCOUNTER — Other Ambulatory Visit (HOSPITAL_COMMUNITY): Payer: Medicare Other

## 2020-12-03 ENCOUNTER — Ambulatory Visit (HOSPITAL_COMMUNITY): Payer: Medicare Other | Admitting: Hematology

## 2020-12-03 VITALS — BP 145/85 | HR 87 | Temp 97.4°F | Resp 16

## 2020-12-03 DIAGNOSIS — Z79899 Other long term (current) drug therapy: Secondary | ICD-10-CM | POA: Diagnosis not present

## 2020-12-03 DIAGNOSIS — N183 Chronic kidney disease, stage 3 unspecified: Secondary | ICD-10-CM | POA: Diagnosis not present

## 2020-12-03 DIAGNOSIS — Z86718 Personal history of other venous thrombosis and embolism: Secondary | ICD-10-CM | POA: Diagnosis not present

## 2020-12-03 DIAGNOSIS — Z87891 Personal history of nicotine dependence: Secondary | ICD-10-CM | POA: Diagnosis not present

## 2020-12-03 DIAGNOSIS — D631 Anemia in chronic kidney disease: Secondary | ICD-10-CM

## 2020-12-03 DIAGNOSIS — E538 Deficiency of other specified B group vitamins: Secondary | ICD-10-CM | POA: Diagnosis not present

## 2020-12-03 DIAGNOSIS — Z7901 Long term (current) use of anticoagulants: Secondary | ICD-10-CM | POA: Diagnosis not present

## 2020-12-03 LAB — CBC WITH DIFFERENTIAL/PLATELET
Abs Immature Granulocytes: 0.02 10*3/uL (ref 0.00–0.07)
Basophils Absolute: 0 10*3/uL (ref 0.0–0.1)
Basophils Relative: 1 %
Eosinophils Absolute: 0.4 10*3/uL (ref 0.0–0.5)
Eosinophils Relative: 7 %
HCT: 36.5 % (ref 36.0–46.0)
Hemoglobin: 11.1 g/dL — ABNORMAL LOW (ref 12.0–15.0)
Immature Granulocytes: 0 %
Lymphocytes Relative: 32 %
Lymphs Abs: 2.1 10*3/uL (ref 0.7–4.0)
MCH: 28.5 pg (ref 26.0–34.0)
MCHC: 30.4 g/dL (ref 30.0–36.0)
MCV: 93.6 fL (ref 80.0–100.0)
Monocytes Absolute: 0.4 10*3/uL (ref 0.1–1.0)
Monocytes Relative: 6 %
Neutro Abs: 3.7 10*3/uL (ref 1.7–7.7)
Neutrophils Relative %: 54 %
Platelets: 185 10*3/uL (ref 150–400)
RBC: 3.9 MIL/uL (ref 3.87–5.11)
RDW: 15.5 % (ref 11.5–15.5)
WBC: 6.6 10*3/uL (ref 4.0–10.5)
nRBC: 0 % (ref 0.0–0.2)

## 2020-12-03 LAB — IRON AND TIBC
Iron: 25 ug/dL — ABNORMAL LOW (ref 28–170)
Saturation Ratios: 12 % (ref 10.4–31.8)
TIBC: 206 ug/dL — ABNORMAL LOW (ref 250–450)
UIBC: 181 ug/dL

## 2020-12-03 LAB — VITAMIN B12: Vitamin B-12: 637 pg/mL (ref 180–914)

## 2020-12-03 LAB — FERRITIN: Ferritin: 334 ng/mL — ABNORMAL HIGH (ref 11–307)

## 2020-12-03 LAB — FOLATE: Folate: <100 ng/mL (ref >5.9)

## 2020-12-03 NOTE — Patient Instructions (Signed)
[place] at [facility] Discharge Instructions  You were seen today by Tarri Abernethy PA-C for your anemia.    LABS: CBC every 2 weeks (before Retacrit injections)  MEDICATIONS: Continue folic acid and D03 injections.  Continue Retacrit injections every 2 weeks.  START taking ferrous sulfate (iron) 325 mg daily.  FOLLOW-UP APPOINTMENT: Return in 3 months for follow up.   Thank you for choosing [place] at [facility] to provide your oncology and hematology care.  To afford each patient quality time with our provider, please arrive at least 15 minutes before your scheduled appointment time.   If you have a lab appointment with the [facility] please come in thru the Main Entrance and check in at the main information desk.  You need to re-schedule your appointment should you arrive 10 or more minutes late.  We strive to give you quality time with our providers, and arriving late affects you and other patients whose appointments are after yours.  Also, if you no show three or more times for appointments you may be dismissed from the clinic at the providers discretion.     Again, thank you for choosing [facility].  Our hope is that these requests will decrease the amount of time that you wait before being seen by our physicians.       _____________________________________________________________  Should you have questions after your visit to [facility], please contact our office at [phone] and follow the prompts.  Our office hours are 8:00 a.m. and 4:30 p.m. Monday - Friday.  Please note that voicemails left after 4:00 p.m. may not be returned until the following business day.  We are closed weekends and major holidays.  You do have access to a nurse 24-7, just call the main number to the clinic [phone] and do not press any options, hold on the line and a nurse will answer the phone.    For prescription  refill requests, have your pharmacy contact our office and allow 72 hours.    Due to Covid, you will need to wear a mask upon entering the hospital. If you do not have a mask, a mask will be given to you at the Main Entrance upon arrival. For doctor visits, patients may have 1 support person age 49 or older with them. For treatment visits, patients can not have anyone with them due to social distancing guidelines and our immunocompromised population.

## 2020-12-03 NOTE — Progress Notes (Signed)
[address] [place], [place] [address]   CLINIC:  Medical Oncology/Hematology  PCP:  Gerlene Fee, NP [place] [id number] [phone]   REASON FOR VISIT:  Follow-up for anemia due to CKD  CURRENT THERAPY: Retacrit injections 10,000 units every 2 weeks for Hgb < 11.0  INTERVAL HISTORY:  Traci Parker 75 y.o. female returns for routine follow-up of anemia due to CKD.  She was last seen by Dr. Delton Coombes on 08/27/2020.  Today she reports feeling fair.  She continues to tolerate Retacrit injections with adequate maintenance of her hemoglobin.  She remains on Eliquis for DVT, but denies any gross rectal hemorrhage or melena; no other sources of blood loss such as epistaxis, hematuria, hematemesis, or hemoptysis.  She denies leg swelling, pain, and erythema.  No shortness of breath, dyspnea on exertion, chest pain, cough, hemoptysis, or palpitations.  She remains on daily folic acid supplement and receives monthly B12 injections at the [facility].  She does not currently take any iron supplement, but has in the past  She has 50% energy and 75% appetite, but does not like the food at the SNF. She endorses that she is maintaining a stable weight.  We reviewed labs today, which showed Hgb 11.1 (MCV 93.6), ferritin 334, serum iron slightly low at 25, iron saturation 12%.  B12 normal at 637.  Most recent creatinine 1.14 with GFR 50 (11/26/2020).  Traci Parker is a long-term resident at the [facility], where she is primarily bed-bound and dependent on wheelchair for mobility.   REVIEW OF SYSTEMS:  Review of Systems  Constitutional: Positive for fatigue (energy 50%). Negative for appetite change, chills, diaphoresis, fever and unexpected weight change.  HENT:   Negative for lump/mass and nosebleeds.   Eyes: Negative for eye problems.  Respiratory: Negative for cough, hemoptysis and shortness of breath.   Cardiovascular: Negative for chest  pain, leg swelling and palpitations.  Gastrointestinal: Positive for nausea (Occasional) and vomiting (Occasional). Negative for abdominal pain, blood in stool, constipation and diarrhea.  Genitourinary: Negative for hematuria.   Skin: Negative.   Neurological: Negative for dizziness, headaches and light-headedness.  Hematological: Does not bruise/bleed easily.      PAST MEDICAL/SURGICAL HISTORY:  Past Medical History:  Diagnosis Date  . Allergy   . Anemia    pernicious anemia  . Arthritis   . Asthma   . Blood transfusion without reported diagnosis   . CKD (chronic kidney disease)   . COPD (chronic obstructive pulmonary disease) ([place]) 12/23/2019  . Gastritis   . GERD (gastroesophageal reflux disease)   . Glaucoma   . History of blood in urine   . Hyperlipidemia   . Hypertension   . Neuropathy    both feet  . Ocular hypertension   . Osteoporosis   . Stroke ([place])   . Type 2 diabetes mellitus ([place]) 12/23/2019  . Vitamin B12 deficiency anemia due to intrinsic factor deficiency    Past Surgical History:  Procedure Laterality Date  . CHOLECYSTECTOMY  2008  . COLONOSCOPY  04/30/2011   Procedure: COLONOSCOPY;  Surgeon: Rogene Houston, MD;  [facility];  Service: Endoscopy;  Laterality: N/A;  . COLONOSCOPY  05/25/2012   Procedure: COLONOSCOPY;  Surgeon: Rogene Houston, MD;  [facility];  Service: Endoscopy;  Laterality: N/A;  1:25-changed to 1200 Ann to notify pt  . COLONOSCOPY Bilateral 12/2017  . ESOPHAGOGASTRODUODENOSCOPY N/A 12/07/2019   Procedure: ESOPHAGOGASTRODUODENOSCOPY (  EGD);  Surgeon: Rogene Houston, MD;  [facility]: [facility];  Service: Endoscopy;  Laterality: N/A;  155  . GALLBLADDER SURGERY    . right breast cystectomy  1988  . TUBAL LIGATION  1975     SOCIAL HISTORY:  Social History   Socioeconomic History  . Marital status: Divorced    Spouse name: Not on file  . Number of children: 4  . Years of education: 9  . Highest  education level: Not on file  Occupational History  . Occupation: retired   Tobacco Use  . Smoking status: Former Smoker    Packs/day: 1.00    Years: 40.00    Pack years: 40.00    Quit date: 04/03/2011    Years since quitting: 9.6  . Smokeless tobacco: Never Used  Vaping Use  . Vaping Use: Never used  Substance and Sexual Activity  . Alcohol use: No  . Drug use: No  . Sexual activity: Not Currently  Other Topics Concern  . Not on file  Social History Narrative   01/[birth year] lives at [facility], [place]   Social Determinants of Health   Financial Resource Strain: Not on file  Food Insecurity: Not on file  Transportation Needs: Not on file  Physical Activity: Not on file  Stress: Not on file  Social Connections: Not on file  Intimate Partner Violence: Not on file    FAMILY HISTORY:  Family History  Problem Relation Age of Onset  . Diabetes Mother   . Hyperlipidemia Mother   . Hypertension Mother   . Heart disease Father   . Hyperlipidemia Father   . Diabetes Sister   . Hyperlipidemia Sister   . Hypertension Sister   . Cancer Brother   . Heart disease Brother   . Hyperlipidemia Brother   . Hypertension Brother   . Diabetes Sister     CURRENT MEDICATIONS:  Facility-Administered Encounter Medications as of 12/03/2020  Medication  . epoetin alfa-epbx (RETACRIT) injection 10,000 Units   Outpatient Encounter Medications as of 12/03/2020  Medication Sig Note  . amLODipine (NORVASC) 10 MG tablet Take 1 tablet (10 mg total) by mouth daily. (Patient taking differently: Take 10 mg by mouth in the morning. (0900))   . apixaban (ELIQUIS) 5 MG TABS tablet Take 1 tablet (5 mg total) by mouth 2 (two) times daily. (Patient taking differently: Take 5 mg by mouth 2 (two) times daily. (0900 & 2100))   . cyanocobalamin (,VITAMIN B-12,) 1000 MCG/ML injection Inject 1 mL (1,000 mcg total) into the muscle every 30 (thirty) days.   . dorzolamide (TRUSOPT) 2 % ophthalmic solution Place 1  drop into both eyes 2 (two) times daily. (0900 & 1700)   . folic acid (FOLVITE) 1 MG tablet Take 1 mg by mouth daily. (0900)   . gabapentin (NEURONTIN) 100 MG capsule Take 100 mg by mouth at bedtime. (2100)   . latanoprost (XALATAN) 0.005 % ophthalmic solution Place 1 drop into both eyes at bedtime. (2100)   . loperamide (IMODIUM A-D) 2 MG tablet Take 2 mg by mouth every 4 (four) hours as needed for diarrhea or loose stools.   . metoCLOPramide (REGLAN) 5 MG tablet Take 1 tablet (5 mg total) by mouth 3 (three) times daily before meals. (Patient taking differently: Take 5 mg by mouth 3 (three) times daily before meals. (0600, 1100 & 1700))   . NON FORMULARY Mechanical soft diet ALLERGIC TO FISH   . omeprazole (PRILOSEC) 40 MG capsule Take 40  mg by mouth daily at 6 (six) AM. (0600)   . ondansetron (ZOFRAN ODT) 4 MG disintegrating tablet Take 1 tablet (4 mg total) by mouth every 8 (eight) hours as needed for nausea or vomiting. Dissolve under tongue (Patient taking differently: Take 4 mg by mouth every 6 (six) hours as needed for nausea or vomiting. Dissolve under tongue)   . polyethylene glycol-electrolytes (NULYTELY) 420 g solution Take 4,000 mLs by mouth as directed. MIX DOSE 1 PACKET WITH WATER TO FILL LINE & STIR UNTIL DISSOLVED. DRINK OVER THE NEXT 30 MINUTES. FOLLOW WITH 2 LARGE GLASSSES OR CLEAR LIQUID OVER 30 MINUTES. 11/20/2020: Before colonoscopy (05/19 & 05/20)  . acetaminophen (TYLENOL) 325 MG tablet Take 2 tablets (650 mg total) by mouth every 6 (six) hours as needed for mild pain (or Fever >/= 101). (Patient not taking: Reported on 12/03/2020)   . albuterol (VENTOLIN HFA) 108 (90 Base) MCG/ACT inhaler Inhale 2 puffs into the lungs every 4 (four) hours as needed for wheezing or shortness of breath. (Patient not taking: Reported on 12/03/2020)     ALLERGIES:  Allergies  Allergen Reactions  . Latex Rash  . Levofloxacin Nausea And Vomiting  . Neomycin-Bacitracin-Polymyxin   [Bacitracin-Neomycin-Polymyxin] Rash  . Other Swelling and Rash    TOMATO  SOUP-PER MAR  . Prednisone Hives, Itching, Rash and Other (See Comments)  . Fish Allergy     rash  . Shellfish Allergy Hives  . Neosporin [Neomycin-Polymyxin-Gramicidin] Itching and Rash     PHYSICAL EXAM:  ECOG PERFORMANCE STATUS: 3 - Symptomatic, >50% confined to bed  Vitals:   12/03/20 1114  BP: (!) 145/85  Pulse: 87  Resp: 16  Temp: (!) 97.4 F (36.3 C)  SpO2: 100%   There were no vitals filed for this visit. Physical Exam Constitutional:      Appearance: Normal appearance. She is underweight.     Comments: Presents in wheelchair  HENT:     Head: Normocephalic and atraumatic.     Mouth/Throat:     Mouth: Mucous membranes are moist.  Eyes:     Extraocular Movements: Extraocular movements intact.     Pupils: Pupils are equal, round, and reactive to light.  Cardiovascular:     Rate and Rhythm: Normal rate and regular rhythm.     Pulses: Normal pulses.     Heart sounds: Normal heart sounds.  Pulmonary:     Effort: Pulmonary effort is normal.     Breath sounds: Normal breath sounds.  Abdominal:     General: Bowel sounds are normal.     Palpations: Abdomen is soft.     Tenderness: There is no abdominal tenderness.  Musculoskeletal:        General: No swelling.     Right lower leg: No edema.     Left lower leg: No edema.  Lymphadenopathy:     Cervical: No cervical adenopathy.  Skin:    General: Skin is warm and dry.  Neurological:     General: No focal deficit present.     Mental Status: She is alert and oriented to person, place, and time.  Psychiatric:        Mood and Affect: Mood normal.        Behavior: Behavior normal.      LABORATORY DATA:  I have reviewed the labs as listed.  CBC    Component Value Date/Time   WBC 6.6 12/03/2020 0953   RBC 3.90 12/03/2020 0953   HGB 11.1 (L) 12/03/2020 3790  HCT 36.5 12/03/2020 0953   PLT 185 12/03/2020 0953   MCV 93.6 12/03/2020  0953   MCH 28.5 12/03/2020 0953   MCHC 30.4 12/03/2020 0953   RDW 15.5 12/03/2020 0953   LYMPHSABS 2.1 12/03/2020 0953   MONOABS 0.4 12/03/2020 0953   EOSABS 0.4 12/03/2020 0953   BASOSABS 0.0 12/03/2020 0953   CMP Latest Ref Rng & Units 11/26/2020 11/07/2020 07/19/2020  Glucose 70 - 99 mg/dL 118(H) 87 83  BUN 8 - 23 mg/dL 21 24(H) 20  Creatinine 0.44 - 1.00 mg/dL 1.14(H) 1.15(H) 1.04(H)  Sodium 135 - 145 mmol/L 138 141 136  Potassium 3.5 - 5.1 mmol/L 4.2 3.8 3.5  Chloride 98 - 111 mmol/L 102 106 100  CO2 22 - 32 mmol/L 27 27 29   Calcium 8.9 - 10.3 mg/dL 9.4 9.2 9.2  Total Protein 6.5 - 8.1 g/dL - - -  Total Bilirubin 0.3 - 1.2 mg/dL - - -  Alkaline Phos 38 - 126 U/L - - -  AST 15 - 41 U/L - - -  ALT 0 - 44 U/L - - -    DIAGNOSTIC IMAGING:  I have independently reviewed the relevant imaging and discussed with the patient.  ASSESSMENT: 1.  Normocytic anemia, secondary to CKD and relative iron deficiency -Last colonoscopy was in June 2019 in [place], 3 polyps removed. -Etiology is from combination of CKD and relative iron deficiency. -Initial work-up included SPEP which was negative. Stool was negative for occult blood. Hemoglobin electrophoresis showed severe microcytosis related to her anemia. She could have underlying thalassemia/sickle cell trait. -She has a history of needing a blood transfusion in the early 2000's. -We have taken over Epogen injections for Dr. Theador Hawthorne. We have started her on 10,000 units every 2 weeks. - Relative iron deficiency noted (12/03/2020): Ferritin 334, serum iron 25, iron saturation 12%  2. CKD stage III -Ultrasound the kidneys on 08/22/2019 showed bilateral renal atrophy, severe atrophy of the right kidney. -She follows with Dr. Theador Hawthorne.  3. I34 and folic acid deficiency: -She is on B12 injections monthly. - She takes daily folic acid supplement  4.  History of lower extremity DVT - Venous duplex (02/02/2020): Extensive acute appearing  left femoropopliteal occlusive DVT extending into the calf veins. - Considered provoked in the setting of immobility/bedbound functional status - She is on chronic Eliquis due to ongoing risk factors (bedbound)   PLAN:  1.  Normocytic anemia: -Denies any bleeding per rectum or melena. - Start taking ferrous sulfate 325 mg daily -Her hemoglobin today is 11.1 with MCV 93.6.   - Continue Retacrit 10,000 units every 2 weeks if hemoglobin below 11. -RTC 3 months for repeat CBC panel, with follow-up visit in office  2. CKD stage III -Last creatinine was 1.14 with GFR 50 (11/26/2020) - Continue follow-up with Dr. Theador Hawthorne  3. V42 and folic acid deficiency: -Continue B12 injections monthly at SNF -Continue daily folic acid supplementation - We will check folate, B12, and methylmalonic acid in 3 months with follow-up visit  4.  History of lower extremity DVT - Continue Eliquis   PLAN SUMMARY & DISPOSITION: - Start taking ferrous sulfate 325 mg daily - Continue monthly folic acid injections at SNF - Continue biweekly CBC and Retacrit if Hgb less than 11.0 - Repeat labs and RTC in 3 months  All questions were answered. The patient knows to call the clinic with any problems, questions or concerns.  Medical decision making: Low  Time spent on visit: I spent  15 minutes counseling the patient face to face. The total time spent in the appointment was 25 minutes and more than 50% was on counseling.   Harriett Rush, PA-C  12/03/20 11:26 AM

## 2020-12-03 NOTE — Progress Notes (Signed)
Traci Parker presents today for Retacrit injection per the provider's orders.   Labs reviewed and HGB noted to be 11.1.  No Retacrit injection today per MD parameters.

## 2020-12-03 NOTE — Patient Instructions (Signed)
[facility]  Discharge Instructions: Thank you for choosing [facility] to provide your oncology and hematology care.  If you have a lab appointment with the Cancer Center, please come in thru the Main Entrance and check in at the main information desk.  Wear comfortable clothing and clothing appropriate for easy access to any Portacath or PICC line.   We strive to give you quality time with your provider. You may need to reschedule your appointment if you arrive late (15 or more minutes).  Arriving late affects you and other patients whose appointments are after yours.  Also, if you miss three or more appointments without notifying the office, you may be dismissed from the clinic at the provider's discretion.      For prescription refill requests, have your pharmacy contact our office and allow 72 hours for refills to be completed.        To help prevent nausea and vomiting after your treatment, we encourage you to take your nausea medication as directed.  BELOW ARE SYMPTOMS THAT SHOULD BE REPORTED IMMEDIATELY: *FEVER GREATER THAN 100.4 F (38 C) OR HIGHER *CHILLS OR SWEATING *NAUSEA AND VOMITING THAT IS NOT CONTROLLED WITH YOUR NAUSEA MEDICATION *UNUSUAL SHORTNESS OF BREATH *UNUSUAL BRUISING OR BLEEDING *URINARY PROBLEMS (pain or burning when urinating, or frequent urination) *BOWEL PROBLEMS (unusual diarrhea, constipation, pain near the anus) TENDERNESS IN MOUTH AND THROAT WITH OR WITHOUT PRESENCE OF ULCERS (sore throat, sores in mouth, or a toothache) UNUSUAL RASH, SWELLING OR PAIN  UNUSUAL VAGINAL DISCHARGE OR ITCHING   Items with * indicate a potential emergency and should be followed up as soon as possible or go to the Emergency Department if any problems should occur.  Please show the CHEMOTHERAPY ALERT CARD or IMMUNOTHERAPY ALERT CARD at check-in to the Emergency Department and triage nurse.  Should you have questions after your visit or need to cancel  or reschedule your appointment, please contact [facility] [phone]  and follow the prompts.  Office hours are 8:00 a.m. to 4:30 p.m. Monday - Friday. Please note that voicemails left after 4:00 p.m. may not be returned until the following business day.  We are closed weekends and major holidays. You have access to a nurse at all times for urgent questions. Please call the main number to the clinic [phone] and follow the prompts.  For any non-urgent questions, you may also contact your provider using MyChart. We now offer e-Visits for anyone 18 and older to request care online for non-urgent symptoms. For details visit [email or web address].   Also download the MyChart app! Go to the app store, search "MyChart", open the app, select [facility], and log in with your MyChart username and password.  Due to Covid, a mask is required upon entering the hospital/clinic. If you do not have a mask, one will be given to you upon arrival. For doctor visits, patients may have 1 support person aged 18 or older with them. For treatment visits, patients cannot have anyone with them due to current Covid guidelines and our immunocompromised population.  

## 2020-12-10 ENCOUNTER — Encounter (HOSPITAL_COMMUNITY): Payer: Self-pay | Admitting: Gastroenterology

## 2020-12-16 ENCOUNTER — Other Ambulatory Visit (HOSPITAL_COMMUNITY): Payer: Self-pay

## 2020-12-16 DIAGNOSIS — N183 Chronic kidney disease, stage 3 unspecified: Secondary | ICD-10-CM

## 2020-12-16 DIAGNOSIS — D509 Iron deficiency anemia, unspecified: Secondary | ICD-10-CM

## 2020-12-16 DIAGNOSIS — D631 Anemia in chronic kidney disease: Secondary | ICD-10-CM

## 2020-12-17 ENCOUNTER — Inpatient Hospital Stay (HOSPITAL_COMMUNITY): Payer: Medicare Other | Attending: Hematology

## 2020-12-17 ENCOUNTER — Inpatient Hospital Stay (HOSPITAL_COMMUNITY): Payer: Medicare Other

## 2020-12-17 ENCOUNTER — Other Ambulatory Visit: Payer: Self-pay

## 2020-12-17 ENCOUNTER — Encounter (HOSPITAL_COMMUNITY): Payer: Self-pay

## 2020-12-17 DIAGNOSIS — D631 Anemia in chronic kidney disease: Secondary | ICD-10-CM | POA: Insufficient documentation

## 2020-12-17 DIAGNOSIS — D509 Iron deficiency anemia, unspecified: Secondary | ICD-10-CM

## 2020-12-17 DIAGNOSIS — N189 Chronic kidney disease, unspecified: Secondary | ICD-10-CM | POA: Insufficient documentation

## 2020-12-17 DIAGNOSIS — N183 Chronic kidney disease, stage 3 unspecified: Secondary | ICD-10-CM

## 2020-12-17 LAB — CBC WITH DIFFERENTIAL/PLATELET
Abs Immature Granulocytes: 0.02 10*3/uL (ref 0.00–0.07)
Basophils Absolute: 0 10*3/uL (ref 0.0–0.1)
Basophils Relative: 1 %
Eosinophils Absolute: 0.5 10*3/uL (ref 0.0–0.5)
Eosinophils Relative: 7 %
HCT: 35.3 % — ABNORMAL LOW (ref 36.0–46.0)
Hemoglobin: 11.1 g/dL — ABNORMAL LOW (ref 12.0–15.0)
Immature Granulocytes: 0 %
Lymphocytes Relative: 34 %
Lymphs Abs: 2.1 10*3/uL (ref 0.7–4.0)
MCH: 29.2 pg (ref 26.0–34.0)
MCHC: 31.4 g/dL (ref 30.0–36.0)
MCV: 92.9 fL (ref 80.0–100.0)
Monocytes Absolute: 0.4 10*3/uL (ref 0.1–1.0)
Monocytes Relative: 6 %
Neutro Abs: 3.2 10*3/uL (ref 1.7–7.7)
Neutrophils Relative %: 52 %
Platelets: 180 10*3/uL (ref 150–400)
RBC: 3.8 MIL/uL — ABNORMAL LOW (ref 3.87–5.11)
RDW: 15.1 % (ref 11.5–15.5)
WBC: 6.1 10*3/uL (ref 4.0–10.5)
nRBC: 0 % (ref 0.0–0.2)

## 2020-12-17 NOTE — Progress Notes (Signed)
Hemoglobin 11.1.  No injection needed.

## 2020-12-26 DIAGNOSIS — I82402 Acute embolism and thrombosis of unspecified deep veins of left lower extremity: Secondary | ICD-10-CM | POA: Diagnosis not present

## 2020-12-26 DIAGNOSIS — K2970 Gastritis, unspecified, without bleeding: Secondary | ICD-10-CM | POA: Diagnosis not present

## 2020-12-26 DIAGNOSIS — E860 Dehydration: Secondary | ICD-10-CM | POA: Diagnosis not present

## 2020-12-26 DIAGNOSIS — R1310 Dysphagia, unspecified: Secondary | ICD-10-CM | POA: Diagnosis not present

## 2020-12-27 DIAGNOSIS — I82402 Acute embolism and thrombosis of unspecified deep veins of left lower extremity: Secondary | ICD-10-CM | POA: Diagnosis not present

## 2020-12-27 DIAGNOSIS — R1310 Dysphagia, unspecified: Secondary | ICD-10-CM | POA: Diagnosis not present

## 2020-12-27 DIAGNOSIS — E860 Dehydration: Secondary | ICD-10-CM | POA: Diagnosis not present

## 2020-12-27 DIAGNOSIS — K2970 Gastritis, unspecified, without bleeding: Secondary | ICD-10-CM | POA: Diagnosis not present

## 2020-12-30 DIAGNOSIS — K2970 Gastritis, unspecified, without bleeding: Secondary | ICD-10-CM | POA: Diagnosis not present

## 2020-12-30 DIAGNOSIS — R1310 Dysphagia, unspecified: Secondary | ICD-10-CM | POA: Diagnosis not present

## 2020-12-30 DIAGNOSIS — E860 Dehydration: Secondary | ICD-10-CM | POA: Diagnosis not present

## 2020-12-30 DIAGNOSIS — I82402 Acute embolism and thrombosis of unspecified deep veins of left lower extremity: Secondary | ICD-10-CM | POA: Diagnosis not present

## 2020-12-31 ENCOUNTER — Inpatient Hospital Stay (HOSPITAL_COMMUNITY): Payer: Medicare Other

## 2020-12-31 ENCOUNTER — Encounter (HOSPITAL_COMMUNITY): Payer: Self-pay

## 2020-12-31 VITALS — BP 125/62 | HR 80 | Temp 96.9°F | Resp 17

## 2020-12-31 DIAGNOSIS — K2970 Gastritis, unspecified, without bleeding: Secondary | ICD-10-CM | POA: Diagnosis not present

## 2020-12-31 DIAGNOSIS — I82402 Acute embolism and thrombosis of unspecified deep veins of left lower extremity: Secondary | ICD-10-CM | POA: Diagnosis not present

## 2020-12-31 DIAGNOSIS — N189 Chronic kidney disease, unspecified: Secondary | ICD-10-CM | POA: Diagnosis not present

## 2020-12-31 DIAGNOSIS — E860 Dehydration: Secondary | ICD-10-CM

## 2020-12-31 DIAGNOSIS — D631 Anemia in chronic kidney disease: Secondary | ICD-10-CM

## 2020-12-31 DIAGNOSIS — D509 Iron deficiency anemia, unspecified: Secondary | ICD-10-CM

## 2020-12-31 DIAGNOSIS — R1310 Dysphagia, unspecified: Secondary | ICD-10-CM | POA: Diagnosis not present

## 2020-12-31 LAB — CBC WITH DIFFERENTIAL/PLATELET
Abs Immature Granulocytes: 0.01 10*3/uL (ref 0.00–0.07)
Basophils Absolute: 0 10*3/uL (ref 0.0–0.1)
Basophils Relative: 1 %
Eosinophils Absolute: 0.5 10*3/uL (ref 0.0–0.5)
Eosinophils Relative: 8 %
HCT: 36 % (ref 36.0–46.0)
Hemoglobin: 11 g/dL — ABNORMAL LOW (ref 12.0–15.0)
Immature Granulocytes: 0 %
Lymphocytes Relative: 32 %
Lymphs Abs: 2.2 10*3/uL (ref 0.7–4.0)
MCH: 28.4 pg (ref 26.0–34.0)
MCHC: 30.6 g/dL (ref 30.0–36.0)
MCV: 93 fL (ref 80.0–100.0)
Monocytes Absolute: 0.4 10*3/uL (ref 0.1–1.0)
Monocytes Relative: 6 %
Neutro Abs: 3.6 10*3/uL (ref 1.7–7.7)
Neutrophils Relative %: 53 %
Platelets: 180 10*3/uL (ref 150–400)
RBC: 3.87 MIL/uL (ref 3.87–5.11)
RDW: 15.3 % (ref 11.5–15.5)
WBC: 6.7 10*3/uL (ref 4.0–10.5)
nRBC: 0 % (ref 0.0–0.2)

## 2020-12-31 MED ORDER — EPOETIN ALFA-EPBX 10000 UNIT/ML IJ SOLN
INTRAMUSCULAR | Status: AC
  Filled 2020-12-31: qty 1

## 2020-12-31 MED ORDER — EPOETIN ALFA-EPBX 10000 UNIT/ML IJ SOLN: 10000.0000 [IU] | Freq: Once | INTRAMUSCULAR | Status: DC

## 2020-12-31 NOTE — Progress Notes (Signed)
Pt here for retacrit 10000 units every 2 weeks.  Hemoglobin is 11. Pt does not need injection.

## 2021-01-01 DIAGNOSIS — E860 Dehydration: Secondary | ICD-10-CM | POA: Diagnosis not present

## 2021-01-01 DIAGNOSIS — K2970 Gastritis, unspecified, without bleeding: Secondary | ICD-10-CM | POA: Diagnosis not present

## 2021-01-01 DIAGNOSIS — I82402 Acute embolism and thrombosis of unspecified deep veins of left lower extremity: Secondary | ICD-10-CM | POA: Diagnosis not present

## 2021-01-01 DIAGNOSIS — R1310 Dysphagia, unspecified: Secondary | ICD-10-CM | POA: Diagnosis not present

## 2021-01-08 ENCOUNTER — Encounter: Payer: Self-pay | Admitting: Adult Health

## 2021-01-08 ENCOUNTER — Non-Acute Institutional Stay (SKILLED_NURSING_FACILITY): Payer: Medicare Other | Admitting: Adult Health

## 2021-01-08 DIAGNOSIS — N1832 Chronic kidney disease, stage 3b: Secondary | ICD-10-CM

## 2021-01-08 DIAGNOSIS — I700 Atherosclerosis of aorta: Secondary | ICD-10-CM

## 2021-01-08 DIAGNOSIS — N183 Chronic kidney disease, stage 3 unspecified: Secondary | ICD-10-CM

## 2021-01-08 DIAGNOSIS — G6289 Other specified polyneuropathies: Secondary | ICD-10-CM | POA: Diagnosis not present

## 2021-01-08 DIAGNOSIS — K2950 Unspecified chronic gastritis without bleeding: Secondary | ICD-10-CM | POA: Diagnosis not present

## 2021-01-08 DIAGNOSIS — E1122 Type 2 diabetes mellitus with diabetic chronic kidney disease: Secondary | ICD-10-CM | POA: Diagnosis not present

## 2021-01-08 DIAGNOSIS — I82402 Acute embolism and thrombosis of unspecified deep veins of left lower extremity: Secondary | ICD-10-CM | POA: Diagnosis not present

## 2021-01-08 DIAGNOSIS — Z1159 Encounter for screening for other viral diseases: Secondary | ICD-10-CM | POA: Diagnosis not present

## 2021-01-08 NOTE — Progress Notes (Signed)
Location:  [facility] Room Number: 176 Place of Service:  SNF (31)   CODE STATUS: dnr   Allergies  Allergen Reactions  . Latex Rash  . Levofloxacin Nausea And Vomiting  . Neomycin-Bacitracin-Polymyxin  [Bacitracin-Neomycin-Polymyxin] Rash  . Other Swelling and Rash    TOMATO  SOUP-PER MAR  . Prednisone Hives, Itching, Rash and Other (See Comments)  . Fish Allergy     rash  . Shellfish Allergy Hives  . Neosporin [Neomycin-Polymyxin-Gramicidin] Itching and Rash    Chief Complaint  Patient presents with  . Medical Management of Chronic Issues       Peripheral neuropathy:    CKD stage 3 due to type 2 diabetes mellitus:  Type 2 diabetes mellitus with stage 3 chronic kidney diasease without long term current use of insulin: Other chronic gastritis without hemorrhage    HPI:  She is a 65 year long term resident of this facility being seen for the management of her chronic illnesses; Peripheral neuropathy:    CKD stage 3 due to type 2 diabetes mellitus:  Type 2 diabetes mellitus with stage 3 chronic kidney diasease without long term current use of insulin: Other chronic gastritis without hemorrhage.  There are no reports of uncontrolled pain, she does get out of bed daily. There are no reports of changes in appetite; her weight is down slight this month.   Past Medical History:  Diagnosis Date  . Allergy   . Anemia    pernicious anemia  . Arthritis   . Asthma   . Blood transfusion without reported diagnosis   . CKD (chronic kidney disease)   . COPD (chronic obstructive pulmonary disease) ([place]) 12/23/2019  . Gastritis   . GERD (gastroesophageal reflux disease)   . Glaucoma   . History of blood in urine   . Hyperlipidemia   . Hypertension   . Neuropathy    both feet  . Ocular hypertension   . Osteoporosis   . Stroke ([place])   . Type 2 diabetes mellitus ([place]) 12/23/2019  . Vitamin B12 deficiency anemia due to intrinsic factor deficiency     Past  Surgical History:  Procedure Laterality Date  . CHOLECYSTECTOMY  2008  . COLONOSCOPY  04/30/2011   Procedure: COLONOSCOPY;  Surgeon: Rogene Houston, MD;  [facility];  Service: Endoscopy;  Laterality: N/A;  . COLONOSCOPY  05/25/2012   Procedure: COLONOSCOPY;  Surgeon: Rogene Houston, MD;  [facility];  Service: Endoscopy;  Laterality: N/A;  1:25-changed to 1200 Ann to notify pt  . COLONOSCOPY Bilateral 12/2017  . COLONOSCOPY WITH PROPOFOL N/A 11/29/2020   Procedure: COLONOSCOPY WITH PROPOFOL;  Surgeon: Harvel Quale, MD;  [facility];  Service: Gastroenterology;  Laterality: N/A;  1:35 patient is in the [facility]  . ESOPHAGOGASTRODUODENOSCOPY N/A 12/07/2019   Procedure: ESOPHAGOGASTRODUODENOSCOPY (EGD);  Surgeon: Rogene Houston, MD;  [facility];  Service: Endoscopy;  Laterality: N/A;  155  . GALLBLADDER SURGERY    . right breast cystectomy  1988  . TUBAL LIGATION  1975    Social History   Socioeconomic History  . Marital status: Divorced    Spouse name: Not on file  . Number of children: 4  . Years of education: 9  . Highest education level: Not on file  Occupational History  . Occupation: retired   Tobacco Use  . Smoking status: Former    Packs/day: 1.00    Years: 40.00  Pack years: 40.00    Types: Cigarettes    Quit date: 04/03/2011    Years since quitting: 9.7  . Smokeless tobacco: Never  Vaping Use  . Vaping Use: Never used  Substance and Sexual Activity  . Alcohol use: No  . Drug use: No  . Sexual activity: Not Currently  Other Topics Concern  . Not on file  Social History Narrative   01/17/20 lives at [facility], [place]   Social Determinants of Health   Financial Resource Strain: Not on file  Food Insecurity: Not on file  Transportation Needs: Not on file  Physical Activity: Not on file  Stress: Not on file  Social Connections: Not on file  Intimate Partner Violence: Not on file    Family History  Problem Relation Age of Onset  . Diabetes Mother   . Hyperlipidemia Mother   . Hypertension Mother   . Heart disease Father   . Hyperlipidemia Father   . Diabetes Sister   . Hyperlipidemia Sister   . Hypertension Sister   . Cancer Brother   . Heart disease Brother   . Hyperlipidemia Brother   . Hypertension Brother   . Diabetes Sister       VITAL SIGNS BP 130/62   Pulse 70   Temp 98.2 F (36.8 C)   Ht 5\' 4"  (1.626 m)   Wt 95 lb 9.6 oz (43.4 kg)   BMI 16.41 kg/m   Facility-Administered Encounter Medications as of 01/08/2021  Medication  . epoetin alfa-epbx (RETACRIT) injection 10,000 Units   Outpatient Encounter Medications as of 01/08/2021  Medication Sig Note  . acetaminophen (TYLENOL) 325 MG tablet Take 2 tablets (650 mg total) by mouth every 6 (six) hours as needed for mild pain (or Fever >/= 101). (Patient not taking: Reported on 12/03/2020)   . albuterol (VENTOLIN HFA) 108 (90 Base) MCG/ACT inhaler Inhale 2 puffs into the lungs every 4 (four) hours as needed for wheezing or shortness of breath. (Patient not taking: Reported on 12/03/2020)   . amLODipine (NORVASC) 10 MG tablet Take 1 tablet (10 mg total) by mouth daily. (Patient taking differently: Take 10 mg by mouth in the morning. (0900))   . apixaban (ELIQUIS) 5 MG TABS tablet Take 1 tablet (5 mg total) by mouth 2 (two) times daily. (Patient taking differently: Take 5 mg by mouth 2 (two) times daily. (0900 & 2100))   . cyanocobalamin (,VITAMIN B-12,) 1000 MCG/ML injection Inject 1 mL (1,000 mcg total) into the muscle every 30 (thirty) days.   . dorzolamide (TRUSOPT) 2 % ophthalmic solution Place 1 drop into both eyes 2 (two) times daily. (0900 & 1700)   . folic acid (FOLVITE) 1 MG tablet Take 1 mg by mouth daily. (0900)   . gabapentin (NEURONTIN) 100 MG capsule Take 100 mg by mouth at bedtime. (2100)   . latanoprost (XALATAN) 0.005 % ophthalmic solution Place 1 drop into both eyes at bedtime. (2100)    . loperamide (IMODIUM A-D) 2 MG tablet Take 2 mg by mouth every 4 (four) hours as needed for diarrhea or loose stools.   . metoCLOPramide (REGLAN) 5 MG tablet Take 1 tablet (5 mg total) by mouth 3 (three) times daily before meals. (Patient taking differently: Take 5 mg by mouth 3 (three) times daily before meals. (0600, 1100 & 1700))   . NON FORMULARY Mechanical soft diet ALLERGIC TO FISH   . omeprazole (PRILOSEC) 40 MG capsule Take 40 mg by mouth daily at 6 (six) AM. (  0600)   . ondansetron (ZOFRAN ODT) 4 MG disintegrating tablet Take 1 tablet (4 mg total) by mouth every 8 (eight) hours as needed for nausea or vomiting. Dissolve under tongue (Patient taking differently: Take 4 mg by mouth every 6 (six) hours as needed for nausea or vomiting. Dissolve under tongue)      SIGNIFICANT DIAGNOSTIC EXAMS   PREVIOUS   12-23-19: ct angio of chest:  1. No evidence of pulmonary embolism. 2. Paraseptal emphysema, mild and worse at the lung apices. 3. 3 mm nodule along the fissure in the RIGHT lower lobe. . 4. Moderate and with 3.6 cm caliber of ascending thoracic aorta.. Aortic aneurysm  5. Emphysema and aortic atherosclerosis  02-02-20: bilateral lower extremity venous ultrasound:  Extensive acute appearing left femoropopliteal occlusive DVT extending into the calf veins. Negative for right lower extremity DVT  02-05-20: MRI cervical spine:  1. Widespread cervical spine degeneration. No acute osseous abnormality. 2. Multilevel mild to moderate spinal stenosis and spinal cord mass effect at C3-C4 through C6-C7, with possible spinal cord myelomalacia at the C6 level. No other cord signal abnormality. 3. Moderate or severe degenerative neural foraminal stenosis at the bilateral C5 and C6 nerve levels.   02-05-20: MRI thoracic spine:  Normal for age MRI appearance of the thoracic spine   02-06-20: ct of chest abdomen and pelvis:  1. No definite evidence of malignancy in the chest, abdomen, or  pelvis. 2. Unchanged small bilateral lung nodules measuring up to 3 mm. Non-contrast chest CT can be considered in 12 months.  3. Large stool burden with rectal distension and presacral inflammation which may reflect stercoral proctitis. 4. Known left lower extremity DVT. 5. Nonobstructing nephrolithiasis. 6. Aortic Atherosclerosis and Emphysema .  NO NEW EXAMS.     LABS REVIEWED PREVIOUS   02-01-20: wbc 10.6; hgb 28.1; mcv 74.1 plt 271; glucose 140 bun 27; creat 1.28; k+ 4.4; an++ 120; ca 8.9 liver normal albumin 3.4 mag 1.9 02-04-20: wbc 4.7; hgb 9.2; hct 28.7; mcv 78.2 plt 273 02-05-20: wbc 5.0; hgb 9.4; hct 29.2; mcv 78.3 plt 270; glucose 95; bun 19; creat 0.90; k+ 3.5; na++ 135; ca 8.6 liver normal albumin 2.6 02-08-20; glucose 85; bun 13; creat 0.91; k+ 3.6; na++ 134; ca 8.6 02-14-20: tsh 6.564 free T3: 2.2 free T4: 1.80 02-16-20: wbc 5.9; hgb 9.6; hct 30.3; mcv 81.9 plt 312; glucose 86; bun 15; creat 1.07; k+ 3.5; na++ 137; ca 8.4  03-01-20: wbc 7.3; hgb 10.1; hct 31.8; mcv 86.6 plt 195; glucose 100; bun 16; creat 1.08 ;k+ 3.5; na++ 137; ca 8.8  03-06-20; wbc 8.4; hgb 11.0; hct 35.7; mcv 87.1 plt 205; glucose 112; bun 17; creat 1.18; k+ 3.8; na++ 134; ca 9.2 liver normal albumin 3.5; ferritin 966; iron 24; tibc 158 vit B 12: 1294; vit D 41.02 03-20-20; wbc 10.9; hgb 10.5; hct 34.3; mcv 89.8 plt 293; glucose 114; bun 18; creat 1.11; k+ 4.4; na++ 139; ca 9.5 liver normal albumin 3.4 vit B 12: 1376; vit D 45.43  04-03-20: wbc 8.7; hgb 10.5; hct 34.3; mcv 91.7 plt 324 04-17-20: wbc 7.6; hgb 10.4; hct 32.9; mcv 92.2 plt 270 05-02-20: wbc 7.2; hgb 10.3; hct 33.0; mcv 94.3 plt 243 05-16-20: wbc 7.1; hgb 11.3; hct 35.5; mcv 92.9 plt 255 05-20-20: hgb a1c 4.8; hcol 197; ldl 130; trig 70 hdl 53 urine micro-albumin 15.8 05-30-20: wbc 7.5; hgb 10.8; hct 34.3; mcv 92.5 plt 248   08-27-20: wbc 7.8; hgb 11.0; hct 35.0; mcv  994.6 plt 218 09-05-20: tsh 2.291 free t4: 0.96 09-10-20: wbc 6.8; hgb 10.2; hct 33.2; mcv  94.3 plt 193 10-08-20: wbc 6.6; hgb 10.2; hct 33.0; mcv 94.8 plt 180;  10-21-20: chol 169; ldl 103; trig 83 hdl 49; hgb a1c 5.0 11-07-20: glucose 87; bun 24; creat 1.15; k+ 3.8; na++ 141; ca 9.2 GFR 49; phos 3.9; albumin 3.6; iron 31; TIBC 185; urine micro-albumin 21.9 11-19-20: wbc 5.8; hgb 10.1; hct 33.1; mcv 94.0 plt 199   NO NEW LABS    Review of Systems  Constitutional:  Negative for malaise/fatigue.  Respiratory:  Negative for cough and shortness of breath.   Cardiovascular:  Negative for chest pain, palpitations and leg swelling.  Gastrointestinal:  Negative for abdominal pain, constipation and heartburn.  Musculoskeletal:  Negative for back pain, joint pain and myalgias.  Skin: Negative.   Neurological:  Negative for dizziness.  Psychiatric/Behavioral:  The patient is not nervous/anxious.      Physical Exam Constitutional:      General: She is not in acute distress.    Appearance: She is well-developed. She is not diaphoretic.  Neck:     Thyroid: No thyromegaly.  Cardiovascular:     Rate and Rhythm: Normal rate and regular rhythm.     Pulses: Normal pulses.     Heart sounds: Normal heart sounds.  Pulmonary:     Effort: Pulmonary effort is normal. No respiratory distress.     Breath sounds: Normal breath sounds.  Abdominal:     General: Bowel sounds are normal. There is no distension.     Palpations: Abdomen is soft.     Tenderness: There is no abdominal tenderness.  Musculoskeletal:     Cervical back: Neck supple.     Right lower leg: No edema.     Left lower leg: No edema.     Comments: Is able to move all extremities Has mild weakness present   Lymphadenopathy:     Cervical: No cervical adenopathy.  Skin:    General: Skin is warm and dry.  Neurological:     Mental Status: She is alert and oriented to person, place, and time.  Psychiatric:        Mood and Affect: Mood normal.      ASSESSMENT/ PLAN:  TODAY      Peripheral neuropathy: is stable will  continue gabapentin 100 mg nightly   2. CKD stage 3 due to type 2 diabetes mellitus: is stable bun 18; creat 1.11  3. Type 2 diabetes mellitus with stage 3 chronic kidney diasease without long term current use of insulin: is stable hgb a1c 5.0; will monitor   4. Other chronic gastritis without hemorrhage is stable will continue prilosec 40 mg daily and reglan 5 mg prior to meals.     PREVIOUS   5. Hypertension associated with stage 3 chronic kidney disease due to type 2 diabetes mellitus is stable b/p 126/74. Will continue norvasc 10 mg daily   6. Anemia due to chronic renal failure treated with erythropoietin stage 3; is stable hgb 10.2; will continue to monitor is treated per oncology  7. Increased intraocular pressure bilateral: is stable will continue trusopt to both eyes twice daily and xalatan to eyes nightly   8. Vitamin B 12 deficiency: is stable will continue monthly B 12 injections and folic acid 1 mg daily   9. Failure to thrive in adult: is stable  weight currently at 95 pounds will continue supplements as directed: no further megace due  to DVT; has completed remeron  10. Aortic atherosclerosis is stable (ct 12-23-19) will monitor   11. Acute deep vein thrombosis (DVT) of proximal vein of left lower extremity: diagnosed 02-02-20 is on asa 81 mg daily   12. Chronic obstructive pulmonary disease unspecified COPD type: is stable will continue albuterol 2 puffs every 4 hours as needed   13. Peripheral neuropathy: is stable will continue gabapentin 100 mg nightly   14. CKD stage 3 due to diabetes mellitus type 2: is stable bun 24; creat 1.15; GFR [place] NP [facility]  Contact [phone] Monday through Friday 8am- 5pm  After hours call [phone]

## 2021-01-14 ENCOUNTER — Other Ambulatory Visit: Payer: Self-pay

## 2021-01-14 ENCOUNTER — Inpatient Hospital Stay (HOSPITAL_COMMUNITY): Payer: Medicare Other | Attending: Hematology

## 2021-01-14 ENCOUNTER — Inpatient Hospital Stay (HOSPITAL_COMMUNITY): Payer: Medicare Other

## 2021-01-14 VITALS — BP 130/59 | HR 90 | Temp 97.0°F | Resp 18

## 2021-01-14 DIAGNOSIS — Z86718 Personal history of other venous thrombosis and embolism: Secondary | ICD-10-CM | POA: Diagnosis not present

## 2021-01-14 DIAGNOSIS — D509 Iron deficiency anemia, unspecified: Secondary | ICD-10-CM

## 2021-01-14 DIAGNOSIS — D631 Anemia in chronic kidney disease: Secondary | ICD-10-CM | POA: Diagnosis not present

## 2021-01-14 DIAGNOSIS — Z7901 Long term (current) use of anticoagulants: Secondary | ICD-10-CM | POA: Insufficient documentation

## 2021-01-14 DIAGNOSIS — N183 Chronic kidney disease, stage 3 unspecified: Secondary | ICD-10-CM | POA: Insufficient documentation

## 2021-01-14 DIAGNOSIS — Z1159 Encounter for screening for other viral diseases: Secondary | ICD-10-CM | POA: Diagnosis not present

## 2021-01-14 DIAGNOSIS — E538 Deficiency of other specified B group vitamins: Secondary | ICD-10-CM | POA: Insufficient documentation

## 2021-01-14 DIAGNOSIS — E860 Dehydration: Secondary | ICD-10-CM

## 2021-01-14 DIAGNOSIS — I82402 Acute embolism and thrombosis of unspecified deep veins of left lower extremity: Secondary | ICD-10-CM | POA: Diagnosis not present

## 2021-01-14 LAB — CBC WITH DIFFERENTIAL/PLATELET
Abs Immature Granulocytes: 0.01 10*3/uL (ref 0.00–0.07)
Basophils Absolute: 0 10*3/uL (ref 0.0–0.1)
Basophils Relative: 1 %
Eosinophils Absolute: 0.5 10*3/uL (ref 0.0–0.5)
Eosinophils Relative: 7 %
HCT: 34.5 % — ABNORMAL LOW (ref 36.0–46.0)
Hemoglobin: 10.7 g/dL — ABNORMAL LOW (ref 12.0–15.0)
Immature Granulocytes: 0 %
Lymphocytes Relative: 30 %
Lymphs Abs: 2 10*3/uL (ref 0.7–4.0)
MCH: 28.7 pg (ref 26.0–34.0)
MCHC: 31 g/dL (ref 30.0–36.0)
MCV: 92.5 fL (ref 80.0–100.0)
Monocytes Absolute: 0.4 10*3/uL (ref 0.1–1.0)
Monocytes Relative: 6 %
Neutro Abs: 3.6 10*3/uL (ref 1.7–7.7)
Neutrophils Relative %: 56 %
Platelets: 168 10*3/uL (ref 150–400)
RBC: 3.73 MIL/uL — ABNORMAL LOW (ref 3.87–5.11)
RDW: 15.3 % (ref 11.5–15.5)
WBC: 6.5 10*3/uL (ref 4.0–10.5)
nRBC: 0 % (ref 0.0–0.2)

## 2021-01-14 MED ORDER — EPOETIN ALFA-EPBX 10000 UNIT/ML IJ SOLN
INTRAMUSCULAR | Status: AC
  Filled 2021-01-14: qty 1

## 2021-01-14 MED ORDER — EPOETIN ALFA-EPBX 10000 UNIT/ML IJ SOLN
10000.0000 [IU] | Freq: Once | INTRAMUSCULAR | Status: AC
  Administered 2021-01-14: 10000 [IU] via SUBCUTANEOUS

## 2021-01-14 NOTE — Patient Instructions (Signed)
[facility]  Discharge Instructions: Thank you for choosing [facility] to provide your oncology and hematology care.  If you have a lab appointment with the Cancer Center, please come in thru the Main Entrance and check in at the main information desk.  Wear comfortable clothing and clothing appropriate for easy access to any Portacath or PICC line.   We strive to give you quality time with your provider. You may need to reschedule your appointment if you arrive late (15 or more minutes).  Arriving late affects you and other patients whose appointments are after yours.  Also, if you miss three or more appointments without notifying the office, you may be dismissed from the clinic at the provider's discretion.      For prescription refill requests, have your pharmacy contact our office and allow 72 hours for refills to be completed.    Today you received the following chemotherapy and/or immunotherapy agents Retacrit      To help prevent nausea and vomiting after your treatment, we encourage you to take your nausea medication as directed.  BELOW ARE SYMPTOMS THAT SHOULD BE REPORTED IMMEDIATELY: *FEVER GREATER THAN 100.4 F (38 C) OR HIGHER *CHILLS OR SWEATING *NAUSEA AND VOMITING THAT IS NOT CONTROLLED WITH YOUR NAUSEA MEDICATION *UNUSUAL SHORTNESS OF BREATH *UNUSUAL BRUISING OR BLEEDING *URINARY PROBLEMS (pain or burning when urinating, or frequent urination) *BOWEL PROBLEMS (unusual diarrhea, constipation, pain near the anus) TENDERNESS IN MOUTH AND THROAT WITH OR WITHOUT PRESENCE OF ULCERS (sore throat, sores in mouth, or a toothache) UNUSUAL RASH, SWELLING OR PAIN  UNUSUAL VAGINAL DISCHARGE OR ITCHING   Items with * indicate a potential emergency and should be followed up as soon as possible or go to the Emergency Department if any problems should occur.  Please show the CHEMOTHERAPY ALERT CARD or IMMUNOTHERAPY ALERT CARD at check-in to the Emergency  Department and triage nurse.  Should you have questions after your visit or need to cancel or reschedule your appointment, please contact [facility] [phone]  and follow the prompts.  Office hours are 8:00 a.m. to 4:30 p.m. Monday - Friday. Please note that voicemails left after 4:00 p.m. may not be returned until the following business day.  We are closed weekends and major holidays. You have access to a nurse at all times for urgent questions. Please call the main number to the clinic [phone] and follow the prompts.  For any non-urgent questions, you may also contact your provider using MyChart. We now offer e-Visits for anyone 18 and older to request care online for non-urgent symptoms. For details visit [email or web address].   Also download the MyChart app! Go to the app store, search "MyChart", open the app, select [facility], and log in with your MyChart username and password.  Due to Covid, a mask is required upon entering the hospital/clinic. If you do not have a mask, one will be given to you upon arrival. For doctor visits, patients may have 1 support person aged 18 or older with them. For treatment visits, patients cannot have anyone with them due to current Covid guidelines and our immunocompromised population.  

## 2021-01-14 NOTE — Progress Notes (Signed)
Traci Parker presents today for Retacrit injection per the provider's orders.  Stable during administration without incident; injection site WNL; see MAR for injection details.  Patient tolerated procedure well and without incident.  No questions or complaints noted at this time.  Discharge from clinic ambulatory in stable condition.  Alert and oriented X 3.  Follow up with [facility] as scheduled.

## 2021-01-16 DIAGNOSIS — I82402 Acute embolism and thrombosis of unspecified deep veins of left lower extremity: Secondary | ICD-10-CM | POA: Diagnosis not present

## 2021-01-16 DIAGNOSIS — Z1159 Encounter for screening for other viral diseases: Secondary | ICD-10-CM | POA: Diagnosis not present

## 2021-01-21 DIAGNOSIS — I82402 Acute embolism and thrombosis of unspecified deep veins of left lower extremity: Secondary | ICD-10-CM | POA: Diagnosis not present

## 2021-01-21 DIAGNOSIS — Z1159 Encounter for screening for other viral diseases: Secondary | ICD-10-CM | POA: Diagnosis not present

## 2021-01-23 DIAGNOSIS — Z1159 Encounter for screening for other viral diseases: Secondary | ICD-10-CM | POA: Diagnosis not present

## 2021-01-23 DIAGNOSIS — I82402 Acute embolism and thrombosis of unspecified deep veins of left lower extremity: Secondary | ICD-10-CM | POA: Diagnosis not present

## 2021-01-28 ENCOUNTER — Inpatient Hospital Stay (HOSPITAL_COMMUNITY): Payer: Medicare Other

## 2021-01-28 DIAGNOSIS — Z86718 Personal history of other venous thrombosis and embolism: Secondary | ICD-10-CM | POA: Diagnosis not present

## 2021-01-28 DIAGNOSIS — N183 Chronic kidney disease, stage 3 unspecified: Secondary | ICD-10-CM | POA: Diagnosis not present

## 2021-01-28 DIAGNOSIS — D631 Anemia in chronic kidney disease: Secondary | ICD-10-CM | POA: Diagnosis not present

## 2021-01-28 DIAGNOSIS — N189 Chronic kidney disease, unspecified: Secondary | ICD-10-CM

## 2021-01-28 DIAGNOSIS — Z1159 Encounter for screening for other viral diseases: Secondary | ICD-10-CM | POA: Diagnosis not present

## 2021-01-28 DIAGNOSIS — E538 Deficiency of other specified B group vitamins: Secondary | ICD-10-CM | POA: Diagnosis not present

## 2021-01-28 DIAGNOSIS — Z7901 Long term (current) use of anticoagulants: Secondary | ICD-10-CM | POA: Diagnosis not present

## 2021-01-28 DIAGNOSIS — D509 Iron deficiency anemia, unspecified: Secondary | ICD-10-CM

## 2021-01-28 DIAGNOSIS — I82402 Acute embolism and thrombosis of unspecified deep veins of left lower extremity: Secondary | ICD-10-CM | POA: Diagnosis not present

## 2021-01-28 LAB — CBC WITH DIFFERENTIAL/PLATELET
Abs Immature Granulocytes: 0.01 10*3/uL (ref 0.00–0.07)
Basophils Absolute: 0 10*3/uL (ref 0.0–0.1)
Basophils Relative: 0 %
Eosinophils Absolute: 0.4 10*3/uL (ref 0.0–0.5)
Eosinophils Relative: 6 %
HCT: 35.6 % — ABNORMAL LOW (ref 36.0–46.0)
Hemoglobin: 11.3 g/dL — ABNORMAL LOW (ref 12.0–15.0)
Immature Granulocytes: 0 %
Lymphocytes Relative: 28 %
Lymphs Abs: 1.9 10*3/uL (ref 0.7–4.0)
MCH: 29.7 pg (ref 26.0–34.0)
MCHC: 31.7 g/dL (ref 30.0–36.0)
MCV: 93.7 fL (ref 80.0–100.0)
Monocytes Absolute: 0.4 10*3/uL (ref 0.1–1.0)
Monocytes Relative: 6 %
Neutro Abs: 4.1 10*3/uL (ref 1.7–7.7)
Neutrophils Relative %: 60 %
Platelets: 193 10*3/uL (ref 150–400)
RBC: 3.8 MIL/uL — ABNORMAL LOW (ref 3.87–5.11)
RDW: 15.9 % — ABNORMAL HIGH (ref 11.5–15.5)
WBC: 6.9 10*3/uL (ref 4.0–10.5)
nRBC: 0 % (ref 0.0–0.2)

## 2021-01-28 NOTE — Progress Notes (Signed)
Patient presents today for Retacrit injection. HGB today 11.3. No Retacrit administered. Vital signs stable. Patient has no complaints today. Discharged from clinic by wheel chair in stable condition. Alert and oriented x 3. F/U with [facility] as scheduled.

## 2021-01-29 ENCOUNTER — Non-Acute Institutional Stay (SKILLED_NURSING_FACILITY): Payer: Medicare Other | Admitting: Adult Health

## 2021-01-29 ENCOUNTER — Encounter: Payer: Self-pay | Admitting: Adult Health

## 2021-01-29 DIAGNOSIS — H40053 Ocular hypertension, bilateral: Secondary | ICD-10-CM

## 2021-01-29 DIAGNOSIS — D631 Anemia in chronic kidney disease: Secondary | ICD-10-CM

## 2021-01-29 DIAGNOSIS — I129 Hypertensive chronic kidney disease with stage 1 through stage 4 chronic kidney disease, or unspecified chronic kidney disease: Secondary | ICD-10-CM | POA: Diagnosis not present

## 2021-01-29 DIAGNOSIS — N183 Chronic kidney disease, stage 3 unspecified: Secondary | ICD-10-CM | POA: Diagnosis not present

## 2021-01-29 DIAGNOSIS — E1122 Type 2 diabetes mellitus with diabetic chronic kidney disease: Secondary | ICD-10-CM

## 2021-01-29 DIAGNOSIS — E538 Deficiency of other specified B group vitamins: Secondary | ICD-10-CM | POA: Diagnosis not present

## 2021-01-29 NOTE — Progress Notes (Signed)
Location:  [facility] Room Number: 133-P Place of Service:  SNF (31)   CODE STATUS: DNR  Allergies  Allergen Reactions  . Latex Rash  . Levofloxacin Nausea And Vomiting  . Neomycin-Bacitracin-Polymyxin  [Bacitracin-Neomycin-Polymyxin] Rash  . Other Swelling and Rash    TOMATO  SOUP-PER MAR  . Prednisone Hives, Itching, Rash and Other (See Comments)  . Fish Allergy     rash  . Shellfish Allergy Hives  . Neosporin [Neomycin-Polymyxin-Gramicidin] Itching and Rash    Chief Complaint  Patient presents with  . Medical Management of Chronic Issues           Hypertension associated with stage 3 chronic kidney disease due to type 2 diabetes mellitus:    Anemia due to chronic renal failure treated with erythropoietin stage 3:  . Increased intraocular pressure bilateral     Vit B 12 Deficiency    HPI:  She is a 75 year old long term resident of this facility being seen for the management of her chronic illnesses: Hypertension associated with stage 3 chronic kidney disease due to type 2 diabetes mellitus:    Anemia due to chronic renal failure treated with erythropoietin stage 3:  . Increased intraocular pressure bilateral     Vit B 12 Deficiency. She does get out of bed daily. There are no reports of uncontrolled pain. Her weight is stable at 95 pounds.   Past Medical History:  Diagnosis Date  . Allergy   . Anemia    pernicious anemia  . Arthritis   . Asthma   . Blood transfusion without reported diagnosis   . CKD (chronic kidney disease)   . COPD (chronic obstructive pulmonary disease) ([place]) 12/23/2019  . Gastritis   . GERD (gastroesophageal reflux disease)   . Glaucoma   . History of blood in urine   . Hyperlipidemia   . Hypertension   . Neuropathy    both feet  . Ocular hypertension   . Osteoporosis   . Stroke ([place])   . Type 2 diabetes mellitus ([place]) 12/23/2019  . Vitamin B12 deficiency anemia due to intrinsic factor deficiency     Past Surgical  History:  Procedure Laterality Date  . CHOLECYSTECTOMY  2008  . COLONOSCOPY  04/30/2011   Procedure: COLONOSCOPY;  Surgeon: Rogene Houston, MD;  [facility];  Service: Endoscopy;  Laterality: N/A;  . COLONOSCOPY  05/25/2012   Procedure: COLONOSCOPY;  Surgeon: Rogene Houston, MD;  [facility];  Service: Endoscopy;  Laterality: N/A;  1:25-changed to 1200 Ann to notify pt  . COLONOSCOPY Bilateral 12/2017  . COLONOSCOPY WITH PROPOFOL N/A 11/29/2020   Procedure: COLONOSCOPY WITH PROPOFOL;  Surgeon: Harvel Quale, MD;  [facility];  Service: Gastroenterology;  Laterality: N/A;  1:35 patient is in the [facility]  . ESOPHAGOGASTRODUODENOSCOPY N/A 12/07/2019   Procedure: ESOPHAGOGASTRODUODENOSCOPY (EGD);  Surgeon: Rogene Houston, MD;  [facility];  Service: Endoscopy;  Laterality: N/A;  155  . GALLBLADDER SURGERY    . right breast cystectomy  1988  . TUBAL LIGATION  1975    Social History   Socioeconomic History  . Marital status: Divorced    Spouse name: Not on file  . Number of children: 4  . Years of education: 9  . Highest education level: Not on file  Occupational History  . Occupation: retired   Tobacco Use  . Smoking status: Former    Packs/day: 1.00  Years: 40.00    Pack years: 40.00    Types: Cigarettes    Quit date: 04/03/2011    Years since quitting: 9.8  . Smokeless tobacco: Never  Vaping Use  . Vaping Use: Never used  Substance and Sexual Activity  . Alcohol use: No  . Drug use: No  . Sexual activity: Not Currently  Other Topics Concern  . Not on file  Social History Narrative   01/17/20 lives at [facility], [address]   Social Determinants of Health   Financial Resource Strain: Not on file  Food Insecurity: Not on file  Transportation Needs: Not on file  Physical Activity: Not on file  Stress: Not on file  Social Connections: Not on file  Intimate Partner Violence: Not on file   Family  History  Problem Relation Age of Onset  . Diabetes Mother   . Hyperlipidemia Mother   . Hypertension Mother   . Heart disease Father   . Hyperlipidemia Father   . Diabetes Sister   . Hyperlipidemia Sister   . Hypertension Sister   . Cancer Brother   . Heart disease Brother   . Hyperlipidemia Brother   . Hypertension Brother   . Diabetes Sister       VITAL SIGNS BP 135/68   Pulse 68   Temp 98.2 F (36.8 C)   Resp 20   Ht 5\' 4"  (1.626 m)   Wt 95 lb (43.1 kg)   SpO2 93%   BMI 16.31 kg/m   Facility-Administered Encounter Medications as of 01/29/2021  Medication  . epoetin alfa-epbx (RETACRIT) injection 10,000 Units   Outpatient Encounter Medications as of 01/29/2021  Medication Sig Note  . acetaminophen (TYLENOL) 325 MG tablet Take 2 tablets (650 mg total) by mouth every 6 (six) hours as needed for mild pain (or Fever >/= 101).   Marland Kitchen albuterol (VENTOLIN HFA) 108 (90 Base) MCG/ACT inhaler Inhale 2 puffs into the lungs every 4 (four) hours as needed for wheezing or shortness of breath.   Marland Kitchen amLODipine (NORVASC) 10 MG tablet Take 1 tablet (10 mg total) by mouth daily.   Marland Kitchen aspirin EC 81 MG tablet Take 81 mg by mouth daily. Swallow whole.   Roseanne Kaufman [place]-Castor Oil (VENELEX) OINT Apply 1 application topically as needed. Apply to sacrum and bilateral buttocks Every Shift - PRN; PRN 1, PRN 2, PRN 3   . cyanocobalamin (,VITAMIN B-12,) 1000 MCG/ML injection Inject 1 mL (1,000 mcg total) into the muscle every 30 (thirty) days.   . dorzolamide (TRUSOPT) 2 % ophthalmic solution Place 1 drop into both eyes 2 (two) times daily. (0900 & 1700)   . ferrous sulfate 325 (65 FE) MG EC tablet Take 325 mg by mouth daily with breakfast.   . folic acid (FOLVITE) 1 MG tablet Take 1 mg by mouth daily. (0900)   . gabapentin (NEURONTIN) 100 MG capsule Take 100 mg by mouth at bedtime. (2100)   . latanoprost (XALATAN) 0.005 % ophthalmic solution Place 1 drop into both eyes at bedtime. (2100)   . loperamide  (IMODIUM A-D) 2 MG tablet Take 2 mg by mouth every 4 (four) hours as needed for diarrhea or loose stools.   . metoCLOPramide (REGLAN) 5 MG tablet Take 1 tablet (5 mg total) by mouth 3 (three) times daily before meals.   . NON FORMULARY Regular thin liquids  ALLERGIC TO FISH   . omeprazole (PRILOSEC) 40 MG capsule Take 40 mg by mouth daily at 6 (six) AM. (0600)   .  ondansetron (ZOFRAN) 4 MG tablet Take 4 mg by mouth every 6 (six) hours as needed for nausea or vomiting.   . Wound Dressings (ALLEVYN LIFE HEEL) PADS Apply 1 patch topically as directed. Every 5 days and as needed for prevention   . [DISCONTINUED] apixaban (ELIQUIS) 5 MG TABS tablet Take 1 tablet (5 mg total) by mouth 2 (two) times daily. (Patient taking differently: Take 5 mg by mouth 2 (two) times daily. (0900 & 2100))   . [DISCONTINUED] ondansetron (ZOFRAN ODT) 4 MG disintegrating tablet Take 1 tablet (4 mg total) by mouth every 8 (eight) hours as needed for nausea or vomiting. Dissolve under tongue   . [DISCONTINUED] polyethylene glycol-electrolytes (NULYTELY) 420 g solution Take 4,000 mLs by mouth as directed. MIX DOSE 1 PACKET WITH WATER TO FILL LINE & STIR UNTIL DISSOLVED. DRINK OVER THE NEXT 30 MINUTES. FOLLOW WITH 2 LARGE GLASSSES OR CLEAR LIQUID OVER 30 MINUTES. 11/20/2020: Before colonoscopy (05/19 & 05/20)     SIGNIFICANT DIAGNOSTIC EXAMS   PREVIOUS   12-23-19: ct angio of chest:  1. No evidence of pulmonary embolism. 2. Paraseptal emphysema, mild and worse at the lung apices. 3. 3 mm nodule along the fissure in the RIGHT lower lobe. . 4. Moderate and with 3.6 cm caliber of ascending thoracic aorta.. Aortic aneurysm  5. Emphysema and aortic atherosclerosis  02-02-20: bilateral lower extremity venous ultrasound:  Extensive acute appearing left femoropopliteal occlusive DVT extending into the calf veins. Negative for right lower extremity DVT  02-05-20: MRI cervical spine:  1. Widespread cervical spine degeneration. No  acute osseous abnormality. 2. Multilevel mild to moderate spinal stenosis and spinal cord mass effect at C3-C4 through C6-C7, with possible spinal cord myelomalacia at the C6 level. No other cord signal abnormality. 3. Moderate or severe degenerative neural foraminal stenosis at the bilateral C5 and C6 nerve levels.   02-05-20: MRI thoracic spine:  Normal for age MRI appearance of the thoracic spine   02-06-20: ct of chest abdomen and pelvis:  1. No definite evidence of malignancy in the chest, abdomen, or pelvis. 2. Unchanged small bilateral lung nodules measuring up to 3 mm. Non-contrast chest CT can be considered in 12 months.  3. Large stool burden with rectal distension and presacral inflammation which may reflect stercoral proctitis. 4. Known left lower extremity DVT. 5. Nonobstructing nephrolithiasis. 6. Aortic Atherosclerosis and Emphysema .  NO NEW EXAMS.     LABS REVIEWED PREVIOUS   02-01-20: wbc 10.6; hgb 28.1; mcv 74.1 plt 271; glucose 140 bun 27; creat 1.28; k+ 4.4; an++ 120; ca 8.9 liver normal albumin 3.4 mag 1.9 02-04-20: wbc 4.7; hgb 9.2; hct 28.7; mcv 78.2 plt 273 02-05-20: wbc 5.0; hgb 9.4; hct 29.2; mcv 78.3 plt 270; glucose 95; bun 19; creat 0.90; k+ 3.5; na++ 135; ca 8.6 liver normal albumin 2.6 02-08-20; glucose 85; bun 13; creat 0.91; k+ 3.6; na++ 134; ca 8.6 02-14-20: tsh 6.564 free T3: 2.2 free T4: 1.80 02-16-20: wbc 5.9; hgb 9.6; hct 30.3; mcv 81.9 plt 312; glucose 86; bun 15; creat 1.07; k+ 3.5; na++ 137; ca 8.4  03-01-20: wbc 7.3; hgb 10.1; hct 31.8; mcv 86.6 plt 195; glucose 100; bun 16; creat 1.08 ;k+ 3.5; na++ 137; ca 8.8  03-06-20; wbc 8.4; hgb 11.0; hct 35.7; mcv 87.1 plt 205; glucose 112; bun 17; creat 1.18; k+ 3.8; na++ 134; ca 9.2 liver normal albumin 3.5; ferritin 966; iron 24; tibc 158 vit B 12: 1294; vit D 41.02 03-20-20; wbc 10.9; hgb  10.5; hct 34.3; mcv 89.8 plt 293; glucose 114; bun 18; creat 1.11; k+ 4.4; na++ 139; ca 9.5 liver normal albumin 3.4 vit B 12:  1376; vit D 45.43  04-03-20: wbc 8.7; hgb 10.5; hct 34.3; mcv 91.7 plt 324 04-17-20: wbc 7.6; hgb 10.4; hct 32.9; mcv 92.2 plt 270 05-02-20: wbc 7.2; hgb 10.3; hct 33.0; mcv 94.3 plt 243 05-16-20: wbc 7.1; hgb 11.3; hct 35.5; mcv 92.9 plt 255 05-20-20: hgb a1c 4.8; hcol 197; ldl 130; trig 70 hdl 53 urine micro-albumin 15.8 05-30-20: wbc 7.5; hgb 10.8; hct 34.3; mcv 92.5 plt 248   08-27-20: wbc 7.8; hgb 11.0; hct 35.0; mcv 994.6 plt 218 09-05-20: tsh 2.291 free t4: 0.96 09-10-20: wbc 6.8; hgb 10.2; hct 33.2; mcv 94.3 plt 193 10-08-20: wbc 6.6; hgb 10.2; hct 33.0; mcv 94.8 plt 180;  10-21-20: chol 169; ldl 103; trig 83 hdl 49; hgb a1c 5.0 11-07-20: glucose 87; bun 24; creat 1.15; k+ 3.8; na++ 141; ca 9.2 GFR 49; phos 3.9; albumin 3.6; iron 31; TIBC 185; urine micro-albumin 21.9 11-19-20: wbc 5.8; hgb 10.1; hct 33.1; mcv 94.0 plt 199   NO NEW LABS   Review of Systems  Constitutional:  Negative for malaise/fatigue.  Respiratory:  Negative for cough and shortness of breath.   Cardiovascular:  Negative for chest pain, palpitations and leg swelling.  Gastrointestinal:  Negative for abdominal pain, constipation and heartburn.  Musculoskeletal:  Negative for back pain, joint pain and myalgias.  Skin: Negative.   Neurological:  Negative for dizziness.  Psychiatric/Behavioral:  The patient is not nervous/anxious.       Physical Exam Constitutional:      General: She is not in acute distress.    Appearance: She is well-developed. She is not diaphoretic.  Neck:     Thyroid: No thyromegaly.  Cardiovascular:     Rate and Rhythm: Normal rate and regular rhythm.     Pulses: Normal pulses.     Heart sounds: Normal heart sounds.  Pulmonary:     Effort: Pulmonary effort is normal. No respiratory distress.     Breath sounds: Normal breath sounds.  Abdominal:     General: Bowel sounds are normal. There is no distension.     Palpations: Abdomen is soft.     Tenderness: There is no abdominal tenderness.   Musculoskeletal:     Cervical back: Neck supple.     Right lower leg: No edema.     Left lower leg: No edema.     Comments:  Is able to move all extremities Has mild weakness present    Lymphadenopathy:     Cervical: No cervical adenopathy.  Skin:    General: Skin is warm and dry.  Neurological:     Mental Status: She is alert and oriented to person, place, and time.  Psychiatric:        Mood and Affect: Mood normal.         ASSESSMENT/ PLAN:  TODAY      Hypertension associated with stage 3 chronic kidney disease due to type 2 diabetes mellitus: is stable b/p 135/68 will continue norvasc 10 mg daily   2. Anemia due to chronic renal failure treated with erythropoietin stage 3: hgb 10.2 will continue monitor treated by oncology   3. Increased intraocular pressure bilateral is stable will continue trusopt to both eyes twice daily ans xalatan to eyes nightly   4. Vit B 12 Deficiency: is stable will continue monthly injections and folic acid 1 mg  daily   PREVIOUS   5. Failure to thrive in adult: is stable  weight currently at 95 pounds will continue supplements as directed: no further megace due to DVT; has completed remeron  6. Aortic atherosclerosis is stable (ct 12-23-19) will monitor   7. Acute deep vein thrombosis (DVT) of proximal vein of left lower extremity: diagnosed 02-02-20 is on asa 81 mg daily   8. Chronic obstructive pulmonary disease unspecified COPD type: is stable will continue albuterol 2 puffs every 4 hours as needed   9. Peripheral neuropathy: is stable will continue gabapentin 100 mg nightly   10. CKD stage 3 due to diabetes mellitus type 2: is stable bun 24; creat 1.15; GFR 49  11. Peripheral neuropathy: is stable will continue gabapentin 100 mg nightly   12. CKD stage 3 due to type 2 diabetes mellitus: is stable bun 18; creat 1.11  13. Type 2 diabetes mellitus with stage 3 chronic kidney diasease without long term current use of insulin: is stable hgb  a1c 5.0; will monitor   14. Other chronic gastritis without hemorrhage is stable will continue prilosec 40 mg daily and reglan 5 mg prior to meals.          Ok Edwards NP [facility]  Contact [phone] Monday through Friday 8am- 5pm  After hours call [phone]

## 2021-01-30 ENCOUNTER — Other Ambulatory Visit (HOSPITAL_COMMUNITY)
Admission: RE | Admit: 2021-01-30 | Discharge: 2021-01-30 | Disposition: A | Discharge status: Home / Self Care | Payer: Medicare Other | Source: Skilled Nursing Facility | Attending: Adult Health | Admitting: Adult Health

## 2021-01-30 DIAGNOSIS — E1129 Type 2 diabetes mellitus with other diabetic kidney complication: Secondary | ICD-10-CM | POA: Diagnosis not present

## 2021-01-30 DIAGNOSIS — Z1159 Encounter for screening for other viral diseases: Secondary | ICD-10-CM | POA: Diagnosis not present

## 2021-01-30 DIAGNOSIS — R270 Ataxia, unspecified: Secondary | ICD-10-CM | POA: Diagnosis not present

## 2021-01-30 DIAGNOSIS — R279 Unspecified lack of coordination: Secondary | ICD-10-CM | POA: Diagnosis not present

## 2021-01-30 DIAGNOSIS — I82402 Acute embolism and thrombosis of unspecified deep veins of left lower extremity: Secondary | ICD-10-CM | POA: Diagnosis not present

## 2021-01-30 DIAGNOSIS — M4802 Spinal stenosis, cervical region: Secondary | ICD-10-CM | POA: Diagnosis not present

## 2021-01-30 LAB — HEMOGLOBIN A1C
Hgb A1c MFr Bld: 5.2 % (ref 4.8–5.6)
Mean Plasma Glucose: 102.54 mg/dL

## 2021-01-31 DIAGNOSIS — R279 Unspecified lack of coordination: Secondary | ICD-10-CM | POA: Diagnosis not present

## 2021-01-31 DIAGNOSIS — M4802 Spinal stenosis, cervical region: Secondary | ICD-10-CM | POA: Diagnosis not present

## 2021-01-31 DIAGNOSIS — R270 Ataxia, unspecified: Secondary | ICD-10-CM | POA: Diagnosis not present

## 2021-02-03 DIAGNOSIS — R279 Unspecified lack of coordination: Secondary | ICD-10-CM | POA: Diagnosis not present

## 2021-02-03 DIAGNOSIS — R270 Ataxia, unspecified: Secondary | ICD-10-CM | POA: Diagnosis not present

## 2021-02-03 DIAGNOSIS — M4802 Spinal stenosis, cervical region: Secondary | ICD-10-CM | POA: Diagnosis not present

## 2021-02-04 DIAGNOSIS — R279 Unspecified lack of coordination: Secondary | ICD-10-CM | POA: Diagnosis not present

## 2021-02-04 DIAGNOSIS — R270 Ataxia, unspecified: Secondary | ICD-10-CM | POA: Diagnosis not present

## 2021-02-04 DIAGNOSIS — M4802 Spinal stenosis, cervical region: Secondary | ICD-10-CM | POA: Diagnosis not present

## 2021-02-04 DIAGNOSIS — I82402 Acute embolism and thrombosis of unspecified deep veins of left lower extremity: Secondary | ICD-10-CM | POA: Diagnosis not present

## 2021-02-04 DIAGNOSIS — Z1159 Encounter for screening for other viral diseases: Secondary | ICD-10-CM | POA: Diagnosis not present

## 2021-02-05 DIAGNOSIS — R279 Unspecified lack of coordination: Secondary | ICD-10-CM | POA: Diagnosis not present

## 2021-02-05 DIAGNOSIS — M4802 Spinal stenosis, cervical region: Secondary | ICD-10-CM | POA: Diagnosis not present

## 2021-02-05 DIAGNOSIS — R270 Ataxia, unspecified: Secondary | ICD-10-CM | POA: Diagnosis not present

## 2021-02-06 DIAGNOSIS — I82402 Acute embolism and thrombosis of unspecified deep veins of left lower extremity: Secondary | ICD-10-CM | POA: Diagnosis not present

## 2021-02-06 DIAGNOSIS — M4802 Spinal stenosis, cervical region: Secondary | ICD-10-CM | POA: Diagnosis not present

## 2021-02-06 DIAGNOSIS — Z1159 Encounter for screening for other viral diseases: Secondary | ICD-10-CM | POA: Diagnosis not present

## 2021-02-06 DIAGNOSIS — R270 Ataxia, unspecified: Secondary | ICD-10-CM | POA: Diagnosis not present

## 2021-02-06 DIAGNOSIS — R279 Unspecified lack of coordination: Secondary | ICD-10-CM | POA: Diagnosis not present

## 2021-02-07 ENCOUNTER — Other Ambulatory Visit (HOSPITAL_COMMUNITY)
Admission: RE | Admit: 2021-02-07 | Discharge: 2021-02-07 | Disposition: A | Discharge status: Home / Self Care | Payer: Medicare Other | Source: Ambulatory Visit | Attending: Nephrology | Admitting: Nephrology

## 2021-02-07 ENCOUNTER — Other Ambulatory Visit: Payer: Self-pay

## 2021-02-07 DIAGNOSIS — E871 Hypo-osmolality and hyponatremia: Secondary | ICD-10-CM | POA: Diagnosis not present

## 2021-02-07 DIAGNOSIS — R270 Ataxia, unspecified: Secondary | ICD-10-CM | POA: Diagnosis not present

## 2021-02-07 DIAGNOSIS — E1122 Type 2 diabetes mellitus with diabetic chronic kidney disease: Secondary | ICD-10-CM | POA: Diagnosis not present

## 2021-02-07 DIAGNOSIS — D638 Anemia in other chronic diseases classified elsewhere: Secondary | ICD-10-CM | POA: Diagnosis not present

## 2021-02-07 DIAGNOSIS — M4802 Spinal stenosis, cervical region: Secondary | ICD-10-CM | POA: Diagnosis not present

## 2021-02-07 DIAGNOSIS — R279 Unspecified lack of coordination: Secondary | ICD-10-CM | POA: Diagnosis not present

## 2021-02-07 DIAGNOSIS — N189 Chronic kidney disease, unspecified: Secondary | ICD-10-CM | POA: Diagnosis not present

## 2021-02-07 DIAGNOSIS — I129 Hypertensive chronic kidney disease with stage 1 through stage 4 chronic kidney disease, or unspecified chronic kidney disease: Secondary | ICD-10-CM | POA: Diagnosis not present

## 2021-02-07 LAB — CBC
HCT: 34.6 % — ABNORMAL LOW (ref 36.0–46.0)
Hemoglobin: 11.3 g/dL — ABNORMAL LOW (ref 12.0–15.0)
MCH: 30.1 pg (ref 26.0–34.0)
MCHC: 32.7 g/dL (ref 30.0–36.0)
MCV: 92.3 fL (ref 80.0–100.0)
Platelets: 185 10*3/uL (ref 150–400)
RBC: 3.75 MIL/uL — ABNORMAL LOW (ref 3.87–5.11)
RDW: 15.2 % (ref 11.5–15.5)
WBC: 7.1 10*3/uL (ref 4.0–10.5)
nRBC: 0 % (ref 0.0–0.2)

## 2021-02-07 LAB — RENAL FUNCTION PANEL
Albumin: 4.2 g/dL (ref 3.5–5.0)
Anion gap: 8 (ref 5–15)
BUN: 24 mg/dL — ABNORMAL HIGH (ref 8–23)
CO2: 26 mmol/L (ref 22–32)
Calcium: 9.4 mg/dL (ref 8.9–10.3)
Chloride: 100 mmol/L (ref 98–111)
Creatinine, Ser: 1.16 mg/dL — ABNORMAL HIGH (ref 0.44–1.00)
GFR, Estimated: 49 mL/min — ABNORMAL LOW (ref >60)
Glucose, Bld: 117 mg/dL — ABNORMAL HIGH (ref 70–99)
Phosphorus: 3.8 mg/dL (ref 2.5–4.6)
Potassium: 4.2 mmol/L (ref 3.5–5.1)
Sodium: 134 mmol/L — ABNORMAL LOW (ref 135–145)

## 2021-02-07 LAB — IRON AND TIBC
Iron: 32 ug/dL (ref 28–170)
Saturation Ratios: 14 % (ref 10.4–31.8)
TIBC: 226 ug/dL — ABNORMAL LOW (ref 250–450)
UIBC: 194 ug/dL

## 2021-02-07 LAB — VITAMIN D 25 HYDROXY (VIT D DEFICIENCY, FRACTURES): Vit D, 25-Hydroxy: 27.34 ng/mL — ABNORMAL LOW (ref 30–100)

## 2021-02-08 LAB — PARATHYROID HORMONE, INTACT (NO CA): PTH: 22 pg/mL (ref 15–65)

## 2021-02-10 DIAGNOSIS — L602 Onychogryphosis: Secondary | ICD-10-CM | POA: Diagnosis not present

## 2021-02-10 DIAGNOSIS — R279 Unspecified lack of coordination: Secondary | ICD-10-CM | POA: Diagnosis not present

## 2021-02-10 DIAGNOSIS — M4802 Spinal stenosis, cervical region: Secondary | ICD-10-CM | POA: Diagnosis not present

## 2021-02-10 DIAGNOSIS — I739 Peripheral vascular disease, unspecified: Secondary | ICD-10-CM | POA: Diagnosis not present

## 2021-02-10 DIAGNOSIS — R270 Ataxia, unspecified: Secondary | ICD-10-CM | POA: Diagnosis not present

## 2021-02-11 ENCOUNTER — Inpatient Hospital Stay (HOSPITAL_COMMUNITY): Payer: Medicare Other

## 2021-02-11 ENCOUNTER — Other Ambulatory Visit: Payer: Self-pay

## 2021-02-11 ENCOUNTER — Inpatient Hospital Stay (HOSPITAL_COMMUNITY): Payer: Medicare Other | Attending: Hematology

## 2021-02-11 VITALS — BP 126/65 | HR 74 | Temp 96.8°F | Resp 14

## 2021-02-11 DIAGNOSIS — Z86718 Personal history of other venous thrombosis and embolism: Secondary | ICD-10-CM | POA: Diagnosis not present

## 2021-02-11 DIAGNOSIS — M4802 Spinal stenosis, cervical region: Secondary | ICD-10-CM | POA: Diagnosis not present

## 2021-02-11 DIAGNOSIS — Z7982 Long term (current) use of aspirin: Secondary | ICD-10-CM | POA: Insufficient documentation

## 2021-02-11 DIAGNOSIS — Z8673 Personal history of transient ischemic attack (TIA), and cerebral infarction without residual deficits: Secondary | ICD-10-CM | POA: Insufficient documentation

## 2021-02-11 DIAGNOSIS — E860 Dehydration: Secondary | ICD-10-CM

## 2021-02-11 DIAGNOSIS — D631 Anemia in chronic kidney disease: Secondary | ICD-10-CM

## 2021-02-11 DIAGNOSIS — Z7901 Long term (current) use of anticoagulants: Secondary | ICD-10-CM | POA: Diagnosis not present

## 2021-02-11 DIAGNOSIS — E538 Deficiency of other specified B group vitamins: Secondary | ICD-10-CM | POA: Insufficient documentation

## 2021-02-11 DIAGNOSIS — Z87891 Personal history of nicotine dependence: Secondary | ICD-10-CM | POA: Diagnosis not present

## 2021-02-11 DIAGNOSIS — N189 Chronic kidney disease, unspecified: Secondary | ICD-10-CM

## 2021-02-11 DIAGNOSIS — Z79899 Other long term (current) drug therapy: Secondary | ICD-10-CM | POA: Insufficient documentation

## 2021-02-11 DIAGNOSIS — I82402 Acute embolism and thrombosis of unspecified deep veins of left lower extremity: Secondary | ICD-10-CM | POA: Diagnosis not present

## 2021-02-11 DIAGNOSIS — D509 Iron deficiency anemia, unspecified: Secondary | ICD-10-CM

## 2021-02-11 DIAGNOSIS — Z1159 Encounter for screening for other viral diseases: Secondary | ICD-10-CM | POA: Diagnosis not present

## 2021-02-11 DIAGNOSIS — R279 Unspecified lack of coordination: Secondary | ICD-10-CM | POA: Diagnosis not present

## 2021-02-11 DIAGNOSIS — N183 Chronic kidney disease, stage 3 unspecified: Secondary | ICD-10-CM | POA: Diagnosis not present

## 2021-02-11 DIAGNOSIS — R270 Ataxia, unspecified: Secondary | ICD-10-CM | POA: Diagnosis not present

## 2021-02-11 LAB — CBC WITH DIFFERENTIAL/PLATELET
Abs Immature Granulocytes: 0.02 10*3/uL (ref 0.00–0.07)
Basophils Absolute: 0.1 10*3/uL (ref 0.0–0.1)
Basophils Relative: 1 %
Eosinophils Absolute: 0.4 10*3/uL (ref 0.0–0.5)
Eosinophils Relative: 6 %
HCT: 34.7 % — ABNORMAL LOW (ref 36.0–46.0)
Hemoglobin: 10.9 g/dL — ABNORMAL LOW (ref 12.0–15.0)
Immature Granulocytes: 0 %
Lymphocytes Relative: 32 %
Lymphs Abs: 2.2 10*3/uL (ref 0.7–4.0)
MCH: 29.1 pg (ref 26.0–34.0)
MCHC: 31.4 g/dL (ref 30.0–36.0)
MCV: 92.8 fL (ref 80.0–100.0)
Monocytes Absolute: 0.4 10*3/uL (ref 0.1–1.0)
Monocytes Relative: 6 %
Neutro Abs: 3.9 10*3/uL (ref 1.7–7.7)
Neutrophils Relative %: 55 %
Platelets: 200 10*3/uL (ref 150–400)
RBC: 3.74 MIL/uL — ABNORMAL LOW (ref 3.87–5.11)
RDW: 15.3 % (ref 11.5–15.5)
WBC: 7.1 10*3/uL (ref 4.0–10.5)
nRBC: 0 % (ref 0.0–0.2)

## 2021-02-11 MED ORDER — EPOETIN ALFA-EPBX 10000 UNIT/ML IJ SOLN
INTRAMUSCULAR | Status: AC
  Filled 2021-02-11: qty 1

## 2021-02-11 MED ORDER — EPOETIN ALFA-EPBX 10000 UNIT/ML IJ SOLN
10000.0000 [IU] | Freq: Once | INTRAMUSCULAR | Status: AC
  Administered 2021-02-11: 10000 [IU] via SUBCUTANEOUS

## 2021-02-11 NOTE — Patient Instructions (Signed)
[facility]  Discharge Instructions: Thank you for choosing [facility] to provide your oncology and hematology care.  If you have a lab appointment with the Cancer Center, please come in thru the Main Entrance and check in at the main information desk.  Wear comfortable clothing and clothing appropriate for easy access to any Portacath or PICC line.   We strive to give you quality time with your provider. You may need to reschedule your appointment if you arrive late (15 or more minutes).  Arriving late affects you and other patients whose appointments are after yours.  Also, if you miss three or more appointments without notifying the office, you may be dismissed from the clinic at the provider's discretion.      For prescription refill requests, have your pharmacy contact our office and allow 72 hours for refills to be completed.    Today you received the following chemotherapy and/or immunotherapy agents Retacrit      To help prevent nausea and vomiting after your treatment, we encourage you to take your nausea medication as directed.  BELOW ARE SYMPTOMS THAT SHOULD BE REPORTED IMMEDIATELY: *FEVER GREATER THAN 100.4 F (38 C) OR HIGHER *CHILLS OR SWEATING *NAUSEA AND VOMITING THAT IS NOT CONTROLLED WITH YOUR NAUSEA MEDICATION *UNUSUAL SHORTNESS OF BREATH *UNUSUAL BRUISING OR BLEEDING *URINARY PROBLEMS (pain or burning when urinating, or frequent urination) *BOWEL PROBLEMS (unusual diarrhea, constipation, pain near the anus) TENDERNESS IN MOUTH AND THROAT WITH OR WITHOUT PRESENCE OF ULCERS (sore throat, sores in mouth, or a toothache) UNUSUAL RASH, SWELLING OR PAIN  UNUSUAL VAGINAL DISCHARGE OR ITCHING   Items with * indicate a potential emergency and should be followed up as soon as possible or go to the Emergency Department if any problems should occur.  Please show the CHEMOTHERAPY ALERT CARD or IMMUNOTHERAPY ALERT CARD at check-in to the Emergency  Department and triage nurse.  Should you have questions after your visit or need to cancel or reschedule your appointment, please contact [facility] [phone]  and follow the prompts.  Office hours are 8:00 a.m. to 4:30 p.m. Monday - Friday. Please note that voicemails left after 4:00 p.m. may not be returned until the following business day.  We are closed weekends and major holidays. You have access to a nurse at all times for urgent questions. Please call the main number to the clinic [phone] and follow the prompts.  For any non-urgent questions, you may also contact your provider using MyChart. We now offer e-Visits for anyone 18 and older to request care online for non-urgent symptoms. For details visit [email or web address].   Also download the MyChart app! Go to the app store, search "MyChart", open the app, select [facility], and log in with your MyChart username and password.  Due to Covid, a mask is required upon entering the hospital/clinic. If you do not have a mask, one will be given to you upon arrival. For doctor visits, patients may have 1 support person aged 18 or older with them. For treatment visits, patients cannot have anyone with them due to current Covid guidelines and our immunocompromised population.  

## 2021-02-11 NOTE — Progress Notes (Signed)
Traci Parker presents today for Retacrit injection per the provider's orders.  Hgb noted to be 10.9.   Stable during administration without incident; injection site WNL; see MAR for injection details.  Patient tolerated procedure well and without incident.  No questions or complaints noted at this time.  Discharge from clinic ambulatory in stable condition.  Alert and oriented X 3.  Follow up with [facility] as scheduled.

## 2021-02-12 DIAGNOSIS — R270 Ataxia, unspecified: Secondary | ICD-10-CM | POA: Diagnosis not present

## 2021-02-12 DIAGNOSIS — R279 Unspecified lack of coordination: Secondary | ICD-10-CM | POA: Diagnosis not present

## 2021-02-12 DIAGNOSIS — M4802 Spinal stenosis, cervical region: Secondary | ICD-10-CM | POA: Diagnosis not present

## 2021-02-13 DIAGNOSIS — M4802 Spinal stenosis, cervical region: Secondary | ICD-10-CM | POA: Diagnosis not present

## 2021-02-13 DIAGNOSIS — R279 Unspecified lack of coordination: Secondary | ICD-10-CM | POA: Diagnosis not present

## 2021-02-13 DIAGNOSIS — R270 Ataxia, unspecified: Secondary | ICD-10-CM | POA: Diagnosis not present

## 2021-02-14 DIAGNOSIS — M4802 Spinal stenosis, cervical region: Secondary | ICD-10-CM | POA: Diagnosis not present

## 2021-02-14 DIAGNOSIS — R279 Unspecified lack of coordination: Secondary | ICD-10-CM | POA: Diagnosis not present

## 2021-02-14 DIAGNOSIS — R270 Ataxia, unspecified: Secondary | ICD-10-CM | POA: Diagnosis not present

## 2021-02-16 DIAGNOSIS — R279 Unspecified lack of coordination: Secondary | ICD-10-CM | POA: Diagnosis not present

## 2021-02-16 DIAGNOSIS — R270 Ataxia, unspecified: Secondary | ICD-10-CM | POA: Diagnosis not present

## 2021-02-16 DIAGNOSIS — M4802 Spinal stenosis, cervical region: Secondary | ICD-10-CM | POA: Diagnosis not present

## 2021-02-17 DIAGNOSIS — M4802 Spinal stenosis, cervical region: Secondary | ICD-10-CM | POA: Diagnosis not present

## 2021-02-17 DIAGNOSIS — R279 Unspecified lack of coordination: Secondary | ICD-10-CM | POA: Diagnosis not present

## 2021-02-17 DIAGNOSIS — R270 Ataxia, unspecified: Secondary | ICD-10-CM | POA: Diagnosis not present

## 2021-02-18 DIAGNOSIS — R270 Ataxia, unspecified: Secondary | ICD-10-CM | POA: Diagnosis not present

## 2021-02-18 DIAGNOSIS — R279 Unspecified lack of coordination: Secondary | ICD-10-CM | POA: Diagnosis not present

## 2021-02-18 DIAGNOSIS — M4802 Spinal stenosis, cervical region: Secondary | ICD-10-CM | POA: Diagnosis not present

## 2021-02-19 DIAGNOSIS — R279 Unspecified lack of coordination: Secondary | ICD-10-CM | POA: Diagnosis not present

## 2021-02-19 DIAGNOSIS — M4802 Spinal stenosis, cervical region: Secondary | ICD-10-CM | POA: Diagnosis not present

## 2021-02-19 DIAGNOSIS — R270 Ataxia, unspecified: Secondary | ICD-10-CM | POA: Diagnosis not present

## 2021-02-20 DIAGNOSIS — R270 Ataxia, unspecified: Secondary | ICD-10-CM | POA: Diagnosis not present

## 2021-02-20 DIAGNOSIS — R279 Unspecified lack of coordination: Secondary | ICD-10-CM | POA: Diagnosis not present

## 2021-02-20 DIAGNOSIS — M4802 Spinal stenosis, cervical region: Secondary | ICD-10-CM | POA: Diagnosis not present

## 2021-02-24 DIAGNOSIS — R270 Ataxia, unspecified: Secondary | ICD-10-CM | POA: Diagnosis not present

## 2021-02-24 DIAGNOSIS — R279 Unspecified lack of coordination: Secondary | ICD-10-CM | POA: Diagnosis not present

## 2021-02-24 DIAGNOSIS — M4802 Spinal stenosis, cervical region: Secondary | ICD-10-CM | POA: Diagnosis not present

## 2021-02-24 NOTE — Progress Notes (Signed)
Traci Parker, [place] [address]   CLINIC:  Medical Oncology/Hematology  PCP:  Gerlene Fee, NP [place] [address] [phone]   REASON FOR VISIT:  Follow-up for anemia due to CKD   CURRENT THERAPY: Retacrit injections 10,000 units every 2 weeks for Hgb < 11.0  INTERVAL HISTORY:  Traci Parker 75 y.o. female returns for routine follow-up of anemia due to CKD.  She was last seen by Tarri Abernethy PA-C on 12/03/2020.  At today's visit, she reports feeling fair.  No recent hospitalizations, surgeries, or changes in baseline health status.  She continues to tolerate Retacrit injections with adequate maintenance of hemoglobin. She reports that her PCP took her off of Eliquis 2 months ago, and that she is now taking aspirin instead.  She denies any gross rectal hemorrhage, melena, or other source of blood loss such as epistaxis, hematuria, hematemesis, or hemoptysis.  She denies any signs or symptoms of recurrent DVT/PE such as unilateral leg swelling, pain, erythema or shortness of breath, dyspnea on exertion, chest pain, cough, hemoptysis, or palpitations.  No B symptoms such as fever, chills, unintentional weight loss, night sweats.  She continues to take daily folic acid supplement, monthly B12 injections, and daily iron supplement.  She reports significant constipation from iron supplements and requests prescription for stool softener.  She has 60% energy and 75% appetite. She endorses that she is maintaining a stable weight.    REVIEW OF SYSTEMS:  Review of Systems  Constitutional:  Positive for fatigue (chronic fatigue, energy 60%). Negative for appetite change, chills, diaphoresis, fever and unexpected weight change.  HENT:   Negative for lump/mass and nosebleeds.   Eyes:  Negative for eye problems.  Respiratory:  Negative for cough, hemoptysis and shortness of breath.   Cardiovascular:  Negative for chest pain, leg  swelling and palpitations.  Gastrointestinal:  Positive for constipation (from iron tablets). Negative for abdominal pain, blood in stool, diarrhea, nausea and vomiting.  Genitourinary:  Negative for hematuria.   Skin: Negative.   Neurological:  Positive for numbness (chronic peripheral neuropathy). Negative for dizziness, headaches and light-headedness.  Hematological:  Does not bruise/bleed easily.     PAST MEDICAL/SURGICAL HISTORY:  Past Medical History:  Diagnosis Date   Allergy    Anemia    pernicious anemia   Arthritis    Asthma    Blood transfusion without reported diagnosis    CKD (chronic kidney disease)    COPD (chronic obstructive pulmonary disease) ([place]) 12/23/2019   Gastritis    GERD (gastroesophageal reflux disease)    Glaucoma    History of blood in urine    Hyperlipidemia    Hypertension    Neuropathy    both feet   Ocular hypertension    Osteoporosis    Stroke ([place])    Type 2 diabetes mellitus ([facility]) 12/23/2019   Vitamin B12 deficiency anemia due to intrinsic factor deficiency    Past Surgical History:  Procedure Laterality Date   CHOLECYSTECTOMY  2008   COLONOSCOPY  04/30/2011   Procedure: COLONOSCOPY;  Surgeon: Rogene Houston, MD;  [facility];  Service: Endoscopy;  Laterality: N/A;   COLONOSCOPY  05/25/2012   Procedure: COLONOSCOPY;  Surgeon: Rogene Houston, MD;  [facility];  Service: Endoscopy;  Laterality: N/A;  1:25-changed to 1200 Ann to notify pt   COLONOSCOPY Bilateral 12/2017   COLONOSCOPY WITH PROPOFOL N/A 11/29/2020   Procedure: COLONOSCOPY WITH PROPOFOL;  Surgeon: Montez Morita, Quillian Quince, MD;  [facility]: [facility];  Service: Gastroenterology;  Laterality: N/A;  1:35 patient is in the [facility]   ESOPHAGOGASTRODUODENOSCOPY N/A 12/07/2019   Procedure: ESOPHAGOGASTRODUODENOSCOPY (EGD);  Surgeon: Rogene Houston, MD;  [facility];  Service: Endoscopy;  Laterality: N/A;  155   GALLBLADDER SURGERY      right breast cystectomy  1988   TUBAL LIGATION  1975     SOCIAL HISTORY:  Social History   Socioeconomic History   Marital status: Divorced    Spouse name: Not on file   Number of children: 4   Years of education: 9   Highest education level: Not on file  Occupational History   Occupation: retired   Tobacco Use   Smoking status: Former    Packs/day: 1.00    Years: 40.00    Pack years: 40.00    Types: Cigarettes    Quit date: 04/03/2011    Years since quitting: 9.9   Smokeless tobacco: Never  Vaping Use   Vaping Use: Never used  Substance and Sexual Activity   Alcohol use: No   Drug use: No   Sexual activity: Not Currently  Other Topics Concern   Not on file  Social History Narrative   [birth year] lives at [facility], [place]   Social Determinants of Health   Financial Resource Strain: Not on file  Food Insecurity: Not on file  Transportation Needs: Not on file  Physical Activity: Not on file  Stress: Not on file  Social Connections: Not on file  Intimate Partner Violence: Not on file    FAMILY HISTORY:  Family History  Problem Relation Age of Onset   Diabetes Mother    Hyperlipidemia Mother    Hypertension Mother    Heart disease Father    Hyperlipidemia Father    Diabetes Sister    Hyperlipidemia Sister    Hypertension Sister    Cancer Brother    Heart disease Brother    Hyperlipidemia Brother    Hypertension Brother    Diabetes Sister     CURRENT MEDICATIONS:  Facility-Administered Encounter Medications as of 02/25/2021  Medication   epoetin alfa-epbx (RETACRIT) injection 10,000 Units   Outpatient Encounter Medications as of 02/25/2021  Medication Sig   acetaminophen (TYLENOL) 325 MG tablet Take 2 tablets (650 mg total) by mouth every 6 (six) hours as needed for mild pain (or Fever >/= 101).   albuterol (VENTOLIN HFA) 108 (90 Base) MCG/ACT inhaler Inhale 2 puffs into the lungs every 4 (four) hours as needed for wheezing or shortness of breath.    amLODipine (NORVASC) 10 MG tablet Take 1 tablet (10 mg total) by mouth daily.   aspirin EC 81 MG tablet Take 81 mg by mouth daily. Swallow whole.   Balsam [place]-Castor Oil (VENELEX) OINT Apply 1 application topically as needed. Apply to sacrum and bilateral buttocks Every Shift - PRN; PRN 1, PRN 2, PRN 3   cyanocobalamin (,VITAMIN B-12,) 1000 MCG/ML injection Inject 1 mL (1,000 mcg total) into the muscle every 30 (thirty) days.   dorzolamide (TRUSOPT) 2 % ophthalmic solution Place 1 drop into both eyes 2 (two) times daily. (0900 & 1700)   ferrous sulfate 325 (65 FE) MG EC tablet Take 325 mg by mouth daily with breakfast.   folic acid (FOLVITE) 1 MG tablet Take 1 mg by mouth daily. (0900)   gabapentin (NEURONTIN) 100 MG capsule Take 100 mg by mouth at bedtime. (2100)   latanoprost (  XALATAN) 0.005 % ophthalmic solution Place 1 drop into both eyes at bedtime. (2100)   loperamide (IMODIUM A-D) 2 MG tablet Take 2 mg by mouth every 4 (four) hours as needed for diarrhea or loose stools.   metoCLOPramide (REGLAN) 5 MG tablet Take 1 tablet (5 mg total) by mouth 3 (three) times daily before meals.   NON FORMULARY Regular thin liquids  ALLERGIC TO FISH   omeprazole (PRILOSEC) 40 MG capsule Take 40 mg by mouth daily at 6 (six) AM. (0600)   ondansetron (ZOFRAN) 4 MG tablet Take 4 mg by mouth every 6 (six) hours as needed for nausea or vomiting.   Wound Dressings (ALLEVYN LIFE HEEL) PADS Apply 1 patch topically as directed. Every 5 days and as needed for prevention    ALLERGIES:  Allergies  Allergen Reactions   Latex Rash   Levofloxacin Nausea And Vomiting   Neomycin-Bacitracin-Polymyxin  [Bacitracin-Neomycin-Polymyxin] Rash   Other Swelling and Rash    TOMATO  SOUP-PER MAR   Prednisone Hives, Itching, Rash and Other (See Comments)   Fish Allergy     rash   Shellfish Allergy Hives   Neosporin [Neomycin-Polymyxin-Gramicidin] Itching and Rash     PHYSICAL EXAM:  ECOG PERFORMANCE STATUS: 3 -  Symptomatic, >50% confined to bed  There were no vitals filed for this visit. There were no vitals filed for this visit. Physical Exam Constitutional:      Appearance: Normal appearance.  HENT:     Head: Normocephalic and atraumatic.     Mouth/Throat:     Mouth: Mucous membranes are moist.  Eyes:     Extraocular Movements: Extraocular movements intact.     Pupils: Pupils are equal, round, and reactive to light.  Cardiovascular:     Rate and Rhythm: Normal rate and regular rhythm.     Pulses: Normal pulses.     Heart sounds: Normal heart sounds.  Pulmonary:     Effort: Pulmonary effort is normal.     Breath sounds: Normal breath sounds.  Abdominal:     General: Bowel sounds are normal.     Palpations: Abdomen is soft.     Tenderness: There is no abdominal tenderness.  Musculoskeletal:        General: No swelling.     Right lower leg: No edema.     Left lower leg: No edema.  Lymphadenopathy:     Cervical: No cervical adenopathy.  Skin:    General: Skin is warm and dry.  Neurological:     General: No focal deficit present.     Mental Status: She is alert and oriented to person, place, and time.  Psychiatric:        Mood and Affect: Mood normal.        Behavior: Behavior normal.     LABORATORY DATA:  I have reviewed the labs as listed.  CBC    Component Value Date/Time   WBC 7.1 02/11/2021 0923   RBC 3.74 (L) 02/11/2021 0923   HGB 10.9 (L) 02/11/2021 0923   HCT 34.7 (L) 02/11/2021 0923   PLT 200 02/11/2021 0923   MCV 92.8 02/11/2021 0923   MCH 29.1 02/11/2021 0923   MCHC 31.4 02/11/2021 0923   RDW 15.3 02/11/2021 0923   LYMPHSABS 2.2 02/11/2021 0923   MONOABS 0.4 02/11/2021 0923   EOSABS 0.4 02/11/2021 0923   BASOSABS 0.1 02/11/2021 0923   CMP Latest Ref Rng & Units 02/07/2021 11/26/2020 11/07/2020  Glucose 70 - 99 mg/dL 117(H) 118(H) 87  BUN 8 - 23 mg/dL 24(H) 21 24(H)  Creatinine 0.44 - 1.00 mg/dL 1.16(H) 1.14(H) 1.15(H)  Sodium 135 - 145 mmol/L 134(L) 138  141  Potassium 3.5 - 5.1 mmol/L 4.2 4.2 3.8  Chloride 98 - 111 mmol/L 100 102 106  CO2 22 - 32 mmol/L '26 27 27  '$ Calcium 8.9 - 10.3 mg/dL 9.4 9.4 9.2  Total Protein 6.5 - 8.1 g/dL - - -  Total Bilirubin 0.3 - 1.2 mg/dL - - -  Alkaline Phos 38 - 126 U/L - - -  AST 15 - 41 U/L - - -  ALT 0 - 44 U/L - - -    DIAGNOSTIC IMAGING:  I have independently reviewed the relevant imaging and discussed with the patient.  ASSESSMENT & PLAN: 1.  Normocytic anemia, secondary to CKD and relative iron deficiency - Etiology is from combination of CKD and relative iron deficiency. - Initial work-up included SPEP which was negative.  Stool was negative for occult blood.  Hemoglobin electrophoresis showed severe microcytosis related to her anemia.  She could have underlying thalassemia/sickle cell trait. - Last colonoscopy was in June 2019 in [place], 3 polyps removed. - She has a history of needing a blood transfusion in the early 2000's. - She denies any bright red blood per rectum or melena  - We have taken over Epogen injections for Dr. Theador Hawthorne.  We have started her on 10,000 units every 2 weeks. - She was started on oral ferrous sulfate at last visit, but reports significant constipation. - Most recent iron panel (02/25/2021): Ferritin 316, iron saturation slightly improved at 17% - Most recent CBC (02/25/2021): Hgb 11.9 - PLAN: Decrease Retacrit to 10,000 units every 4 weeks (with same-day CBC).  Continue iron supplement with the addition of Colace twice daily.  RTC in 3 months for repeat labs and office visit.    2.  CKD stage III -Ultrasound the kidneys on 08/22/2019 showed bilateral renal atrophy, severe atrophy of the right kidney. - Most recent renal function (02/07/2021): Creatinine 1.16, GFR 49 - PLAN: Continue follow-up with Dr. Theador Hawthorne  3.  123456 and folic acid deficiency: - She is on B12 injections monthly at SNF - She takes daily folic acid supplement -Labs today (02/25/2021): Normal B12 764,  methylmalonic acid and folate pending - PLAN: Continue monthly B12 injections and daily folic acid supplement at SNF  4.  History of lower extremity DVT - Venous duplex (02/02/2020): Extensive acute appearing left femoropopliteal occlusive DVT extending into the calf veins. - Considered provoked in the setting of immobility/bedbound functional status - Eliquis was discontinued by her PCP, placed on aspirin and statin   PLAN SUMMARY & DISPOSITION: - Continue ferrous sulfate (with Colace twice daily) - Decrease dose of Retacrit to every 4 weeks (with CBC) - Full lab panel in 3 months - Office visit the week after labs  All questions were answered. The patient knows to call the clinic with any problems, questions or concerns.  Medical decision making: Low  Time spent on visit: I spent 15 minutes counseling the patient face to face. The total time spent in the appointment was 25 minutes and more than 50% was on counseling.   Harriett Rush, PA-C  02/25/2021 10:52 AM

## 2021-02-25 ENCOUNTER — Inpatient Hospital Stay (HOSPITAL_COMMUNITY): Payer: Medicare Other

## 2021-02-25 ENCOUNTER — Inpatient Hospital Stay (HOSPITAL_BASED_OUTPATIENT_CLINIC_OR_DEPARTMENT_OTHER): Payer: Medicare Other | Admitting: Physician Assistant

## 2021-02-25 VITALS — BP 134/65 | HR 88 | Temp 98.1°F | Resp 16 | Wt 101.8 lb

## 2021-02-25 DIAGNOSIS — Z7901 Long term (current) use of anticoagulants: Secondary | ICD-10-CM | POA: Diagnosis not present

## 2021-02-25 DIAGNOSIS — Z86718 Personal history of other venous thrombosis and embolism: Secondary | ICD-10-CM | POA: Diagnosis not present

## 2021-02-25 DIAGNOSIS — Z7982 Long term (current) use of aspirin: Secondary | ICD-10-CM | POA: Diagnosis not present

## 2021-02-25 DIAGNOSIS — Z87891 Personal history of nicotine dependence: Secondary | ICD-10-CM | POA: Diagnosis not present

## 2021-02-25 DIAGNOSIS — M4802 Spinal stenosis, cervical region: Secondary | ICD-10-CM | POA: Diagnosis not present

## 2021-02-25 DIAGNOSIS — D631 Anemia in chronic kidney disease: Secondary | ICD-10-CM

## 2021-02-25 DIAGNOSIS — Z79899 Other long term (current) drug therapy: Secondary | ICD-10-CM | POA: Diagnosis not present

## 2021-02-25 DIAGNOSIS — N183 Chronic kidney disease, stage 3 unspecified: Secondary | ICD-10-CM | POA: Diagnosis not present

## 2021-02-25 DIAGNOSIS — K5903 Drug induced constipation: Secondary | ICD-10-CM | POA: Diagnosis not present

## 2021-02-25 DIAGNOSIS — E538 Deficiency of other specified B group vitamins: Secondary | ICD-10-CM | POA: Diagnosis not present

## 2021-02-25 DIAGNOSIS — Z8673 Personal history of transient ischemic attack (TIA), and cerebral infarction without residual deficits: Secondary | ICD-10-CM | POA: Diagnosis not present

## 2021-02-25 DIAGNOSIS — R270 Ataxia, unspecified: Secondary | ICD-10-CM | POA: Diagnosis not present

## 2021-02-25 DIAGNOSIS — R279 Unspecified lack of coordination: Secondary | ICD-10-CM | POA: Diagnosis not present

## 2021-02-25 LAB — CBC WITH DIFFERENTIAL/PLATELET
Abs Immature Granulocytes: 0.01 10*3/uL (ref 0.00–0.07)
Basophils Absolute: 0.1 10*3/uL (ref 0.0–0.1)
Basophils Relative: 1 %
Eosinophils Absolute: 0.5 10*3/uL (ref 0.0–0.5)
Eosinophils Relative: 8 %
HCT: 37.7 % (ref 36.0–46.0)
Hemoglobin: 11.9 g/dL — ABNORMAL LOW (ref 12.0–15.0)
Immature Granulocytes: 0 %
Lymphocytes Relative: 31 %
Lymphs Abs: 2 10*3/uL (ref 0.7–4.0)
MCH: 29.8 pg (ref 26.0–34.0)
MCHC: 31.6 g/dL (ref 30.0–36.0)
MCV: 94.3 fL (ref 80.0–100.0)
Monocytes Absolute: 0.4 10*3/uL (ref 0.1–1.0)
Monocytes Relative: 5 %
Neutro Abs: 3.7 10*3/uL (ref 1.7–7.7)
Neutrophils Relative %: 55 %
Platelets: 210 10*3/uL (ref 150–400)
RBC: 4 MIL/uL (ref 3.87–5.11)
RDW: 15.7 % — ABNORMAL HIGH (ref 11.5–15.5)
WBC: 6.6 10*3/uL (ref 4.0–10.5)
nRBC: 0 % (ref 0.0–0.2)

## 2021-02-25 LAB — VITAMIN B12: Vitamin B-12: 764 pg/mL (ref 180–914)

## 2021-02-25 LAB — FERRITIN: Ferritin: 316 ng/mL — ABNORMAL HIGH (ref 11–307)

## 2021-02-25 LAB — FOLATE: Folate: >100 ng/mL (ref >5.9)

## 2021-02-25 LAB — IRON AND TIBC
Iron: 38 ug/dL (ref 28–170)
Saturation Ratios: 17 % (ref 10.4–31.8)
TIBC: 225 ug/dL — ABNORMAL LOW (ref 250–450)
UIBC: 187 ug/dL

## 2021-02-25 MED ORDER — DOCUSATE SODIUM 100 MG PO CAPS: 100.0000 mg | ORAL_CAPSULE | Freq: Two times a day (BID) | ORAL | 3 refills | Status: DC

## 2021-02-25 NOTE — Progress Notes (Signed)
Retacrit injection held today for hgb 11.9.

## 2021-02-25 NOTE — Patient Instructions (Signed)
[facility] Discharge Instructions  You were seen today by Tarri Abernethy PA-C for your anemia.  Your blood levels have improved after taking the iron pill and getting the Retacrit injections.  I want you to continue to take the iron (ferrous sulfate), but because of the constipation it is causing, I have sent a prescription for you to start taking Colace twice daily.  We will also decrease the frequency of your shots to once a month (instead of every other week).  LABS: Return in 3 months for repeat labs  OTHER TESTS: None at this time  MEDICATIONS: Start taking Colace twice daily for iron induced constipation  FOLLOW-UP APPOINTMENT: Office visit in 3 months (after labs)   Thank you for choosing [facility] to provide your oncology and hematology care.  To afford each patient quality time with our provider, please arrive at least 15 minutes before your scheduled appointment time.   If you have a lab appointment with the [facility] please come in thru the Main Entrance and check in at the main information desk.  You need to re-schedule your appointment should you arrive 10 or more minutes late.  We strive to give you quality time with our providers, and arriving late affects you and other patients whose appointments are after yours.  Also, if you no show three or more times for appointments you may be dismissed from the clinic at the providers discretion.     Again, thank you for choosing [facility].  Our hope is that these requests will decrease the amount of time that you wait before being seen by our physicians.       _____________________________________________________________  Should you have questions after your visit to [facility], please contact our office at [phone] and follow the prompts.  Our office hours are 8:00 a.m. and 4:30 p.m. Monday - Friday.  Please note that  voicemails left after 4:00 p.m. may not be returned until the following business day.  We are closed weekends and major holidays.  You do have access to a nurse 24-7, just call the main number to the clinic [phone] and do not press any options, hold on the line and a nurse will answer the phone.    For prescription refill requests, have your pharmacy contact our office and allow 72 hours.    Due to Covid, you will need to wear a mask upon entering the hospital. If you do not have a mask, a mask will be given to you at the Main Entrance upon arrival. For doctor visits, patients may have 1 support person age 10 or older with them. For treatment visits, patients can not have anyone with them due to social distancing guidelines and our immunocompromised population.

## 2021-02-26 DIAGNOSIS — R270 Ataxia, unspecified: Secondary | ICD-10-CM | POA: Diagnosis not present

## 2021-02-26 DIAGNOSIS — R279 Unspecified lack of coordination: Secondary | ICD-10-CM | POA: Diagnosis not present

## 2021-02-26 DIAGNOSIS — M4802 Spinal stenosis, cervical region: Secondary | ICD-10-CM | POA: Diagnosis not present

## 2021-02-27 DIAGNOSIS — M4802 Spinal stenosis, cervical region: Secondary | ICD-10-CM | POA: Diagnosis not present

## 2021-02-27 DIAGNOSIS — R279 Unspecified lack of coordination: Secondary | ICD-10-CM | POA: Diagnosis not present

## 2021-02-27 DIAGNOSIS — R270 Ataxia, unspecified: Secondary | ICD-10-CM | POA: Diagnosis not present

## 2021-02-28 DIAGNOSIS — M4802 Spinal stenosis, cervical region: Secondary | ICD-10-CM | POA: Diagnosis not present

## 2021-02-28 DIAGNOSIS — R270 Ataxia, unspecified: Secondary | ICD-10-CM | POA: Diagnosis not present

## 2021-02-28 DIAGNOSIS — R279 Unspecified lack of coordination: Secondary | ICD-10-CM | POA: Diagnosis not present

## 2021-02-28 LAB — METHYLMALONIC ACID, SERUM: Methylmalonic Acid, Quantitative: 185 nmol/L (ref 0–378)

## 2021-03-03 DIAGNOSIS — R270 Ataxia, unspecified: Secondary | ICD-10-CM | POA: Diagnosis not present

## 2021-03-03 DIAGNOSIS — M4802 Spinal stenosis, cervical region: Secondary | ICD-10-CM | POA: Diagnosis not present

## 2021-03-03 DIAGNOSIS — R279 Unspecified lack of coordination: Secondary | ICD-10-CM | POA: Diagnosis not present

## 2021-03-04 ENCOUNTER — Non-Acute Institutional Stay (SKILLED_NURSING_FACILITY): Payer: Medicare Other | Admitting: Internal Medicine

## 2021-03-04 ENCOUNTER — Encounter: Payer: Self-pay | Admitting: Internal Medicine

## 2021-03-04 DIAGNOSIS — E1122 Type 2 diabetes mellitus with diabetic chronic kidney disease: Secondary | ICD-10-CM | POA: Diagnosis not present

## 2021-03-04 DIAGNOSIS — M4802 Spinal stenosis, cervical region: Secondary | ICD-10-CM | POA: Diagnosis not present

## 2021-03-04 DIAGNOSIS — I10 Essential (primary) hypertension: Secondary | ICD-10-CM

## 2021-03-04 DIAGNOSIS — R279 Unspecified lack of coordination: Secondary | ICD-10-CM | POA: Diagnosis not present

## 2021-03-04 DIAGNOSIS — D631 Anemia in chronic kidney disease: Secondary | ICD-10-CM | POA: Diagnosis not present

## 2021-03-04 DIAGNOSIS — N183 Chronic kidney disease, stage 3 unspecified: Secondary | ICD-10-CM | POA: Diagnosis not present

## 2021-03-04 DIAGNOSIS — R270 Ataxia, unspecified: Secondary | ICD-10-CM | POA: Diagnosis not present

## 2021-03-04 NOTE — Assessment & Plan Note (Signed)
Current H/H 11.9/37.7; prior H/H 10.9/34.7 Indices normocytic B12 , iron & folate WNL Anemia improved ROS negative for bleeding dyscrasias & none reported by Staff Monitor CBC

## 2021-03-04 NOTE — Progress Notes (Signed)
NURSING HOME LOCATION:  [facility] ROOM NUMBER:  [id number]  CODE STATUS:  DNR  PCP: Ok Edwards NP  This is a nursing facility follow up visit of chronic medical diagnoses & to document compliance with Regulation 483.30 (c) in The [place] Manual Phase 2 which mandates caregiver visit ( visits can alternate among physician, PA or NP as per statutes) within 10 days of 30 days / 60 days/ 90 days post admission to SNF date    Interim medical record and care since last SNF visit was updated with review of diagnostic studies and change in clinical status since last visit were documented.  HPI: She is a permanent resident of this facility with medical diagnoses of chronic anemia, history of asthma, CKD, COPD, GERD, dyslipidemia, essential hypertension, diabetes with neuropathy, and history of stroke. Current labs reveal CKD stage III a with a creatinine of 1.16 and GFR of 49.  Anemia has improved with rise in hemoglobin from 10.9 to 11.9 and hematocrit from 34.7 to 37.7.  Indices are normal.  B12, folate, and iron levels were all normal.  Review of systems: She is a very sweet and engaging woman.  She was wearing gloves and a robe.  When I asked her why ;she states that "I stay cold all the time, 2 types of anemic".  She states this has improved and she is down to "1 shot per month".  She states that she has neuropathy with numbness in her feet and hands.  She was constipated but Colace has taken care of this.  She states that she is doing well in therapy and is walking more.  Constitutional: No fever, significant weight change, fatigue  Eyes: No redness, discharge, pain, vision change ENT/mouth: No nasal congestion,  purulent discharge, earache, change in hearing, sore throat  Cardiovascular: No chest pain, palpitations, paroxysmal nocturnal dyspnea, claudication, edema  Respiratory: No cough, sputum production, hemoptysis, DOE, significant snoring, apnea    Gastrointestinal: No heartburn, dysphagia, abdominal pain, nausea /vomiting, rectal bleeding, melena, change in bowels Genitourinary: No dysuria, hematuria, pyuria, incontinence, nocturia Musculoskeletal: No joint stiffness, joint swelling, weakness, pain Dermatologic: No rash, pruritus, change in appearance of skin Neurologic: No dizziness, headache, syncope, seizures Psychiatric: No significant anxiety, depression, insomnia, anorexia Endocrine: No change in hair/skin/nails, excessive thirst, excessive hunger, excessive urination  Hematologic/lymphatic: No significant bruising, lymphadenopathy, abnormal bleeding Allergy/immunology: No itchy/watery eyes, significant sneezing, urticaria, angioedema  Physical exam:  Pertinent or positive findings: She is thin and almost cachectic.  Her ribs are visible over the upper chest.  Lacrimal glands are prominent.  She is edentulous.  S2 accentuated.Pedal pulses are decreased.  Limbs are atrophic but strength is surprisingly good to opposition.  General appearance: no acute distress, increased work of breathing is present.   Lymphatic: No lymphadenopathy about the head, neck, axilla. Eyes: No conjunctival inflammation or lid edema is present. There is no scleral icterus. Ears:  External ear exam shows no significant lesions or deformities.   Nose:  External nasal examination shows no deformity or inflammation. Nasal mucosa are pink and moist without lesions, exudates Oral exam:  There is no oropharyngeal erythema or exudate. Neck:  No thyromegaly, masses, tenderness noted.    Heart:  Normal rate and regular rhythm. S1 normal without gallop, murmur, click, rub .  Lungs: Chest clear to auscultation without wheezes, rhonchi, rales, rubs. Abdomen: Bowel sounds are normal. Abdomen is soft and nontender with no organomegaly, hernias, masses. GU: Deferred  Extremities:  No cyanosis, clubbing, edema  Neurologic exam :Balance, Rhomberg, finger to nose testing  could not be completed due to clinical state Skin: Warm & dry w/o tenting. No significant lesions or rash.  See summary under each active problem in the Problem List with associated updated therapeutic plan

## 2021-03-04 NOTE — Patient Instructions (Signed)
See assessment and plan under each diagnosis in the problem list and acutely for this visit 

## 2021-03-04 NOTE — Assessment & Plan Note (Signed)
BP controlled; no change in antihypertensive medications  

## 2021-03-04 NOTE — Assessment & Plan Note (Addendum)
Current creatinine 1.16 / GFR 49 ; CKD Stage 3 a Medication List reviewed. No nephrotoxic risk present as Gabapentin is very low dose.

## 2021-03-05 DIAGNOSIS — R270 Ataxia, unspecified: Secondary | ICD-10-CM | POA: Diagnosis not present

## 2021-03-05 DIAGNOSIS — M4802 Spinal stenosis, cervical region: Secondary | ICD-10-CM | POA: Diagnosis not present

## 2021-03-05 DIAGNOSIS — R279 Unspecified lack of coordination: Secondary | ICD-10-CM | POA: Diagnosis not present

## 2021-03-06 DIAGNOSIS — R279 Unspecified lack of coordination: Secondary | ICD-10-CM | POA: Diagnosis not present

## 2021-03-06 DIAGNOSIS — R270 Ataxia, unspecified: Secondary | ICD-10-CM | POA: Diagnosis not present

## 2021-03-06 DIAGNOSIS — M4802 Spinal stenosis, cervical region: Secondary | ICD-10-CM | POA: Diagnosis not present

## 2021-03-07 DIAGNOSIS — R279 Unspecified lack of coordination: Secondary | ICD-10-CM | POA: Diagnosis not present

## 2021-03-07 DIAGNOSIS — M4802 Spinal stenosis, cervical region: Secondary | ICD-10-CM | POA: Diagnosis not present

## 2021-03-07 DIAGNOSIS — R270 Ataxia, unspecified: Secondary | ICD-10-CM | POA: Diagnosis not present

## 2021-03-10 DIAGNOSIS — R270 Ataxia, unspecified: Secondary | ICD-10-CM | POA: Diagnosis not present

## 2021-03-10 DIAGNOSIS — R279 Unspecified lack of coordination: Secondary | ICD-10-CM | POA: Diagnosis not present

## 2021-03-10 DIAGNOSIS — M4802 Spinal stenosis, cervical region: Secondary | ICD-10-CM | POA: Diagnosis not present

## 2021-03-11 DIAGNOSIS — R279 Unspecified lack of coordination: Secondary | ICD-10-CM | POA: Diagnosis not present

## 2021-03-11 DIAGNOSIS — R270 Ataxia, unspecified: Secondary | ICD-10-CM | POA: Diagnosis not present

## 2021-03-11 DIAGNOSIS — M4802 Spinal stenosis, cervical region: Secondary | ICD-10-CM | POA: Diagnosis not present

## 2021-03-12 DIAGNOSIS — R279 Unspecified lack of coordination: Secondary | ICD-10-CM | POA: Diagnosis not present

## 2021-03-12 DIAGNOSIS — M4802 Spinal stenosis, cervical region: Secondary | ICD-10-CM | POA: Diagnosis not present

## 2021-03-12 DIAGNOSIS — R270 Ataxia, unspecified: Secondary | ICD-10-CM | POA: Diagnosis not present

## 2021-03-13 DIAGNOSIS — R279 Unspecified lack of coordination: Secondary | ICD-10-CM | POA: Diagnosis not present

## 2021-03-13 DIAGNOSIS — M4802 Spinal stenosis, cervical region: Secondary | ICD-10-CM | POA: Diagnosis not present

## 2021-03-13 DIAGNOSIS — R270 Ataxia, unspecified: Secondary | ICD-10-CM | POA: Diagnosis not present

## 2021-03-14 DIAGNOSIS — M4802 Spinal stenosis, cervical region: Secondary | ICD-10-CM | POA: Diagnosis not present

## 2021-03-14 DIAGNOSIS — R279 Unspecified lack of coordination: Secondary | ICD-10-CM | POA: Diagnosis not present

## 2021-03-14 DIAGNOSIS — R270 Ataxia, unspecified: Secondary | ICD-10-CM | POA: Diagnosis not present

## 2021-03-17 DIAGNOSIS — R279 Unspecified lack of coordination: Secondary | ICD-10-CM | POA: Diagnosis not present

## 2021-03-17 DIAGNOSIS — R270 Ataxia, unspecified: Secondary | ICD-10-CM | POA: Diagnosis not present

## 2021-03-17 DIAGNOSIS — M4802 Spinal stenosis, cervical region: Secondary | ICD-10-CM | POA: Diagnosis not present

## 2021-03-18 DIAGNOSIS — R279 Unspecified lack of coordination: Secondary | ICD-10-CM | POA: Diagnosis not present

## 2021-03-18 DIAGNOSIS — M4802 Spinal stenosis, cervical region: Secondary | ICD-10-CM | POA: Diagnosis not present

## 2021-03-18 DIAGNOSIS — R270 Ataxia, unspecified: Secondary | ICD-10-CM | POA: Diagnosis not present

## 2021-03-19 DIAGNOSIS — M4802 Spinal stenosis, cervical region: Secondary | ICD-10-CM | POA: Diagnosis not present

## 2021-03-19 DIAGNOSIS — R279 Unspecified lack of coordination: Secondary | ICD-10-CM | POA: Diagnosis not present

## 2021-03-19 DIAGNOSIS — R270 Ataxia, unspecified: Secondary | ICD-10-CM | POA: Diagnosis not present

## 2021-03-20 DIAGNOSIS — R279 Unspecified lack of coordination: Secondary | ICD-10-CM | POA: Diagnosis not present

## 2021-03-20 DIAGNOSIS — R270 Ataxia, unspecified: Secondary | ICD-10-CM | POA: Diagnosis not present

## 2021-03-20 DIAGNOSIS — M4802 Spinal stenosis, cervical region: Secondary | ICD-10-CM | POA: Diagnosis not present

## 2021-03-21 DIAGNOSIS — R279 Unspecified lack of coordination: Secondary | ICD-10-CM | POA: Diagnosis not present

## 2021-03-21 DIAGNOSIS — R270 Ataxia, unspecified: Secondary | ICD-10-CM | POA: Diagnosis not present

## 2021-03-21 DIAGNOSIS — M4802 Spinal stenosis, cervical region: Secondary | ICD-10-CM | POA: Diagnosis not present

## 2021-03-24 DIAGNOSIS — R279 Unspecified lack of coordination: Secondary | ICD-10-CM | POA: Diagnosis not present

## 2021-03-24 DIAGNOSIS — M4802 Spinal stenosis, cervical region: Secondary | ICD-10-CM | POA: Diagnosis not present

## 2021-03-24 DIAGNOSIS — R270 Ataxia, unspecified: Secondary | ICD-10-CM | POA: Diagnosis not present

## 2021-03-25 ENCOUNTER — Inpatient Hospital Stay (HOSPITAL_COMMUNITY): Payer: Medicare Other

## 2021-03-25 ENCOUNTER — Inpatient Hospital Stay (HOSPITAL_COMMUNITY): Payer: Medicare Other | Attending: Hematology

## 2021-03-25 DIAGNOSIS — E538 Deficiency of other specified B group vitamins: Secondary | ICD-10-CM | POA: Insufficient documentation

## 2021-03-25 DIAGNOSIS — N183 Chronic kidney disease, stage 3 unspecified: Secondary | ICD-10-CM | POA: Insufficient documentation

## 2021-03-25 DIAGNOSIS — Z86718 Personal history of other venous thrombosis and embolism: Secondary | ICD-10-CM | POA: Insufficient documentation

## 2021-03-25 DIAGNOSIS — D631 Anemia in chronic kidney disease: Secondary | ICD-10-CM | POA: Insufficient documentation

## 2021-03-25 LAB — CBC WITH DIFFERENTIAL/PLATELET
Abs Immature Granulocytes: 0.01 10*3/uL (ref 0.00–0.07)
Basophils Absolute: 0 10*3/uL (ref 0.0–0.1)
Basophils Relative: 1 %
Eosinophils Absolute: 0.7 10*3/uL — ABNORMAL HIGH (ref 0.0–0.5)
Eosinophils Relative: 8 %
HCT: 36.7 % (ref 36.0–46.0)
Hemoglobin: 11.4 g/dL — ABNORMAL LOW (ref 12.0–15.0)
Immature Granulocytes: 0 %
Lymphocytes Relative: 25 %
Lymphs Abs: 2.2 10*3/uL (ref 0.7–4.0)
MCH: 29.4 pg (ref 26.0–34.0)
MCHC: 31.1 g/dL (ref 30.0–36.0)
MCV: 94.6 fL (ref 80.0–100.0)
Monocytes Absolute: 0.5 10*3/uL (ref 0.1–1.0)
Monocytes Relative: 6 %
Neutro Abs: 5.3 10*3/uL (ref 1.7–7.7)
Neutrophils Relative %: 60 %
Platelets: 203 10*3/uL (ref 150–400)
RBC: 3.88 MIL/uL (ref 3.87–5.11)
RDW: 15.1 % (ref 11.5–15.5)
WBC: 8.8 10*3/uL (ref 4.0–10.5)
nRBC: 0 % (ref 0.0–0.2)

## 2021-03-25 NOTE — Progress Notes (Signed)
Hemoglobin 11.4 today. Will hold Reticrit per protocol.

## 2021-03-26 DIAGNOSIS — M4802 Spinal stenosis, cervical region: Secondary | ICD-10-CM | POA: Diagnosis not present

## 2021-03-26 DIAGNOSIS — R270 Ataxia, unspecified: Secondary | ICD-10-CM | POA: Diagnosis not present

## 2021-03-26 DIAGNOSIS — R279 Unspecified lack of coordination: Secondary | ICD-10-CM | POA: Diagnosis not present

## 2021-03-27 ENCOUNTER — Non-Acute Institutional Stay (SKILLED_NURSING_FACILITY): Payer: Medicare Other | Admitting: Adult Health

## 2021-03-27 ENCOUNTER — Encounter: Payer: Self-pay | Admitting: Adult Health

## 2021-03-27 DIAGNOSIS — G959 Disease of spinal cord, unspecified: Secondary | ICD-10-CM | POA: Diagnosis not present

## 2021-03-27 DIAGNOSIS — E1122 Type 2 diabetes mellitus with diabetic chronic kidney disease: Secondary | ICD-10-CM

## 2021-03-27 DIAGNOSIS — M4802 Spinal stenosis, cervical region: Secondary | ICD-10-CM | POA: Diagnosis not present

## 2021-03-27 DIAGNOSIS — J449 Chronic obstructive pulmonary disease, unspecified: Secondary | ICD-10-CM | POA: Diagnosis not present

## 2021-03-27 DIAGNOSIS — R279 Unspecified lack of coordination: Secondary | ICD-10-CM | POA: Diagnosis not present

## 2021-03-27 DIAGNOSIS — R270 Ataxia, unspecified: Secondary | ICD-10-CM | POA: Diagnosis not present

## 2021-03-27 DIAGNOSIS — I700 Atherosclerosis of aorta: Secondary | ICD-10-CM

## 2021-03-27 DIAGNOSIS — N183 Chronic kidney disease, stage 3 unspecified: Secondary | ICD-10-CM

## 2021-03-27 NOTE — Progress Notes (Signed)
Location:  [facility] Room Number: 150-P Place of Service:  SNF (31)   CODE STATUS: DNR  Allergies  Allergen Reactions   Latex Rash   Levofloxacin Nausea And Vomiting   Neomycin-Bacitracin-Polymyxin  [Bacitracin-Neomycin-Polymyxin] Rash   Other Swelling and Rash    TOMATO  SOUP-PER MAR   Prednisone Hives, Itching, Rash and Other (See Comments)   Fish Allergy     rash   Shellfish Allergy Hives   Neosporin [Neomycin-Polymyxin-Gramicidin] Itching and Rash    Chief Complaint  Patient presents with   Acute Visit    Care plan meeting    HPI:  We have come together for her care plan meeting.  BIMS 15/15 mood 3/30: decreased energy. She is nonambulatory. She requires extensive assist to dependent with her adls. Is incontinent of bladder and bowel. She is able to feed herself after setup. There have been no falls. Dietary: regular weight is 104.2 pounds with a gradual gain of 7 pounds over the past 6 months regular diet good appetite. Therapy PT: stand pivot supervision bed mobility supervision; ambulate 200 feet with supervision; stairs contact guard; can do outside ramps curbs with contact guard; doing car transfer in the AM; will then be starting on OT.   She continues to be followed for her chronic illnesses including:   Aortic atherosclerosis Chronic obstructive pulmonary disease unspecified COPD type  CKD stage 3 due to type 2 diabetes mellitus  Cervical myelopathy  Past Medical History:  Diagnosis Date   Allergy    Anemia    pernicious anemia   Arthritis    Asthma    Blood transfusion without reported diagnosis    CKD (chronic kidney disease)    COPD (chronic obstructive pulmonary disease) ([place]) 12/23/2019   Gastritis    GERD (gastroesophageal reflux disease)    Glaucoma    History of blood in urine    Hyperlipidemia    Hypertension    Neuropathy    both feet   Ocular hypertension    Osteoporosis    Stroke ([place])    Type 2 diabetes mellitus  ([place]) 12/23/2019   Vitamin B12 deficiency anemia due to intrinsic factor deficiency     Past Surgical History:  Procedure Laterality Date   CHOLECYSTECTOMY  2008   COLONOSCOPY  04/30/2011   Procedure: COLONOSCOPY;  Surgeon: Rogene Houston, MD;  [facility];  Service: Endoscopy;  Laterality: N/A;   COLONOSCOPY  05/25/2012   Procedure: COLONOSCOPY;  Surgeon: Rogene Houston, MD;  [facility];  Service: Endoscopy;  Laterality: N/A;  1:25-changed to 1200 Ann to notify pt   COLONOSCOPY Bilateral 12/2017   COLONOSCOPY WITH PROPOFOL N/A 11/29/2020   Procedure: COLONOSCOPY WITH PROPOFOL;  Surgeon: Harvel Quale, MD;  [facility];  Service: Gastroenterology;  Laterality: N/A;  1:35 [facility] is in the [facility]   ESOPHAGOGASTRODUODENOSCOPY N/A 12/07/2019   Procedure: ESOPHAGOGASTRODUODENOSCOPY (EGD);  Surgeon: Rogene Houston, MD;  [facility];  Service: Endoscopy;  Laterality: N/A;  155   GALLBLADDER SURGERY     right breast cystectomy  1988   TUBAL LIGATION  1975    Social History   Socioeconomic History   Marital status: Divorced    Spouse name: Not on file   Number of children: 4   Years of education: 9   Highest education level: Not on file  Occupational History   Occupation: retired   Tobacco Use   Smoking status: Former  Packs/day: 1.00    Years: 40.00    Pack years: 40.00    Types: Cigarettes    Quit date: 04/03/2011    Years since quitting: 9.9   Smokeless tobacco: Never  Vaping Use   Vaping Use: Never used  Substance and Sexual Activity   Alcohol use: No   Drug use: No   Sexual activity: Not Currently  Other Topics Concern   Not on file  Social History Narrative   01/17/20 lives at [facility], [place]   Social Determinants of Health   Financial Resource Strain: Not on file  Food Insecurity: Not on file  Transportation Needs: Not on file  Physical Activity: Not on file  Stress: Not on file   Social Connections: Not on file  Intimate Partner Violence: Not on file   Family History  Problem Relation Age of Onset   Diabetes Mother    Hyperlipidemia Mother    Hypertension Mother    Heart disease Father    Hyperlipidemia Father    Diabetes Sister    Hyperlipidemia Sister    Hypertension Sister    Cancer Brother    Heart disease Brother    Hyperlipidemia Brother    Hypertension Brother    Diabetes Sister       VITAL SIGNS BP 138/75   Pulse 75   Temp 98.4 F (36.9 C)   Resp 20   Ht '5\' 4"'$  (1.626 m)   Wt 104 lb 3.2 oz (47.3 kg)   SpO2 93%   BMI 17.89 kg/m   Outpatient Encounter Medications as of 03/27/2021  Medication Sig   acetaminophen (TYLENOL) 325 MG tablet Take 2 tablets (650 mg total) by mouth every 6 (six) hours as needed for mild pain (or Fever >/= 101).   albuterol (VENTOLIN HFA) 108 (90 Base) MCG/ACT inhaler Inhale 2 puffs into the lungs every 4 (four) hours as needed for wheezing or shortness of breath.   amLODipine (NORVASC) 10 MG tablet Take 1 tablet (10 mg total) by mouth daily.   aspirin EC 81 MG tablet Take 81 mg by mouth daily. Swallow whole.   Balsam [place]-Castor Oil (VENELEX) OINT Apply 1 application topically as needed. Apply to sacrum and bilateral buttocks Every Shift - PRN; PRN 1, PRN 2, PRN 3   cyanocobalamin (,VITAMIN B-12,) 1000 MCG/ML injection Inject 1 mL (1,000 mcg total) into the muscle every 30 (thirty) days.   docusate sodium (COLACE) 100 MG capsule Take 1 capsule (100 mg total) by mouth 2 (two) times daily.   dorzolamide (TRUSOPT) 2 % ophthalmic solution Place 1 drop into both eyes 2 (two) times daily. (A999333 & [id number])   folic acid (FOLVITE) 1 MG tablet Take 1 mg by mouth daily. (0900)   gabapentin (NEURONTIN) 100 MG capsule Take 100 mg by mouth at bedtime. (2100)   Iron, Ferrous Sulfate, 325 (65 Fe) MG TABS Take by mouth. amt: 1 tab; oral,Once A Day   latanoprost (XALATAN) 0.005 % ophthalmic solution Place 1 drop into both eyes at  bedtime. (2100)   loperamide (IMODIUM A-D) 2 MG tablet Take 2 mg by mouth every 4 (four) hours as needed for diarrhea or loose stools.   metoCLOPramide (REGLAN) 5 MG tablet Take 1 tablet (5 mg total) by mouth 3 (three) times daily before meals.   NON FORMULARY Regular thin liquids  ALLERGIC TO FISH   omeprazole (PRILOSEC) 40 MG capsule Take 40 mg by mouth daily at 6 (six) AM. (0600)   ondansetron (ZOFRAN) 4  MG tablet Take 4 mg by mouth every 6 (six) hours as needed for nausea or vomiting.   Wound Dressings (ALLEVYN LIFE HEEL) PADS Apply 1 patch topically as directed. Every 5 days and as needed for prevention   No facility-administered encounter medications on file as of 03/27/2021.     SIGNIFICANT DIAGNOSTIC EXAMS  PREVIOUS   12-23-19: ct angio of chest:  1. No evidence of pulmonary embolism. 2. Paraseptal emphysema, mild and worse at the lung apices. 3. 3 mm nodule along the fissure in the RIGHT lower lobe. . 4. Moderate and with 3.6 cm caliber of ascending thoracic aorta.. Aortic aneurysm  5. Emphysema and aortic atherosclerosis  02-02-20: bilateral lower extremity venous ultrasound:  Extensive acute appearing left femoropopliteal occlusive DVT extending into the calf veins. Negative for right lower extremity DVT  02-05-20: MRI cervical spine:  1. Widespread cervical spine degeneration. No acute osseous abnormality. 2. Multilevel mild to moderate spinal stenosis and spinal cord mass effect at C3-C4 through C6-C7, with possible spinal cord myelomalacia at the C6 level. No other cord signal abnormality. 3. Moderate or severe degenerative neural foraminal stenosis at the bilateral C5 and C6 nerve levels.   02-05-20: MRI thoracic spine:  Normal for age MRI appearance of the thoracic spine   02-06-20: ct of chest abdomen and pelvis:  1. No definite evidence of malignancy in the chest, abdomen, or pelvis. 2. Unchanged small bilateral lung nodules measuring up to 3 mm. Non-contrast chest  CT can be considered in 12 months.  3. Large stool burden with rectal distension and presacral inflammation which may reflect stercoral proctitis. 4. Known left lower extremity DVT. 5. Nonobstructing nephrolithiasis. 6. Aortic Atherosclerosis and Emphysema .  NO NEW EXAMS.     LABS REVIEWED PREVIOUS   03-20-20; wbc 10.9; hgb 10.5; hct 34.3; mcv 89.8 plt 293; glucose 114; bun 18; creat 1.11; k+ 4.4; na++ 139; ca 9.5 liver normal albumin 3.4 vit B 12: 1376; vit D 45.43  04-03-20: wbc 8.7; hgb 10.5; hct 34.3; mcv 91.7 plt 324 04-17-20: wbc 7.6; hgb 10.4; hct 32.9; mcv 92.2 plt 270 05-02-20: wbc 7.2; hgb 10.3; hct 33.0; mcv 94.3 plt 243 05-16-20: wbc 7.1; hgb 11.3; hct 35.5; mcv 92.9 plt 255 05-20-20: hgb a1c 4.8; hcol 197; ldl 130; trig 70 hdl 53 urine micro-albumin 15.8 05-30-20: wbc 7.5; hgb 10.8; hct 34.3; mcv 92.5 plt 248   08-27-20: wbc 7.8; hgb 11.0; hct 35.0; mcv 994.6 plt 218 09-05-20: tsh 2.291 free t4: 0.96 09-10-20: wbc 6.8; hgb 10.2; hct 33.2; mcv 94.3 plt 193 10-08-20: wbc 6.6; hgb 10.2; hct 33.0; mcv 94.8 plt 180;  10-21-20: chol 169; ldl 103; trig 83 hdl 49; hgb a1c 5.0 11-07-20: glucose 87; bun 24; creat 1.15; k+ 3.8; na++ 141; ca 9.2 GFR 49; phos 3.9; albumin 3.6; iron 31; TIBC 185; urine micro-albumin 21.9 11-19-20: wbc 5.8; hgb 10.1; hct 33.1; mcv 94.0 plt 199   NO NEW LABS   Review of Systems  Constitutional:  Negative for malaise/fatigue.  Respiratory:  Negative for cough and shortness of breath.   Cardiovascular:  Negative for chest pain, palpitations and leg swelling.  Gastrointestinal:  Negative for abdominal pain, constipation and heartburn.  Musculoskeletal:  Negative for back pain, joint pain and myalgias.  Skin: Negative.   Neurological:  Negative for dizziness.  Psychiatric/Behavioral:  The patient is not nervous/anxious.    Physical Exam Constitutional:      General: She is not in acute distress.    Appearance:  She is well-developed. She is not diaphoretic.   Neck:     Thyroid: No thyromegaly.  Cardiovascular:     Rate and Rhythm: Normal rate and regular rhythm.     Pulses: Normal pulses.     Heart sounds: Normal heart sounds.  Pulmonary:     Effort: Pulmonary effort is normal. No respiratory distress.     Breath sounds: Normal breath sounds.  Abdominal:     General: Bowel sounds are normal. There is no distension.     Palpations: Abdomen is soft.     Tenderness: There is no abdominal tenderness.  Musculoskeletal:        General: Normal range of motion.     Cervical back: Neck supple.     Right lower leg: No edema.     Left lower leg: No edema.     Comments:    Lymphadenopathy:     Cervical: No cervical adenopathy.  Skin:    General: Skin is warm and dry.  Neurological:     Mental Status: She is alert and oriented to person, place, and time.  Psychiatric:        Mood and Affect: Mood normal.      ASSESSMENT/ PLAN:  TODAY  Aortic atherosclerosis Chronic obstructive pulmonary disease unspecified COPD type CKD stage 3 due to type 2 diabetes mellitus Cervical myelopathy   Will continue current medications Will continue current plan of care Will continue to monitor her status. Goal is to return back home.     Time spent with patient: 40 minutes: plan of care medications.    Ok Edwards NP [facility]  Contact [phone] Monday through Friday 8am- 5pm  After hours call [phone]

## 2021-03-28 DIAGNOSIS — R270 Ataxia, unspecified: Secondary | ICD-10-CM | POA: Diagnosis not present

## 2021-03-28 DIAGNOSIS — R279 Unspecified lack of coordination: Secondary | ICD-10-CM | POA: Diagnosis not present

## 2021-03-28 DIAGNOSIS — M4802 Spinal stenosis, cervical region: Secondary | ICD-10-CM | POA: Diagnosis not present

## 2021-04-01 DIAGNOSIS — R270 Ataxia, unspecified: Secondary | ICD-10-CM | POA: Diagnosis not present

## 2021-04-01 DIAGNOSIS — M4802 Spinal stenosis, cervical region: Secondary | ICD-10-CM | POA: Diagnosis not present

## 2021-04-01 DIAGNOSIS — R279 Unspecified lack of coordination: Secondary | ICD-10-CM | POA: Diagnosis not present

## 2021-04-02 ENCOUNTER — Non-Acute Institutional Stay (SKILLED_NURSING_FACILITY): Payer: Medicare Other | Admitting: Adult Health

## 2021-04-02 ENCOUNTER — Encounter: Payer: Self-pay | Admitting: Adult Health

## 2021-04-02 DIAGNOSIS — I129 Hypertensive chronic kidney disease with stage 1 through stage 4 chronic kidney disease, or unspecified chronic kidney disease: Secondary | ICD-10-CM

## 2021-04-02 DIAGNOSIS — I700 Atherosclerosis of aorta: Secondary | ICD-10-CM

## 2021-04-02 DIAGNOSIS — E069 Thyroiditis, unspecified: Secondary | ICD-10-CM

## 2021-04-02 DIAGNOSIS — H40053 Ocular hypertension, bilateral: Secondary | ICD-10-CM

## 2021-04-02 DIAGNOSIS — K2950 Unspecified chronic gastritis without bleeding: Secondary | ICD-10-CM

## 2021-04-02 DIAGNOSIS — Z86718 Personal history of other venous thrombosis and embolism: Secondary | ICD-10-CM

## 2021-04-02 DIAGNOSIS — G959 Disease of spinal cord, unspecified: Secondary | ICD-10-CM | POA: Diagnosis not present

## 2021-04-02 DIAGNOSIS — E1122 Type 2 diabetes mellitus with diabetic chronic kidney disease: Secondary | ICD-10-CM | POA: Diagnosis not present

## 2021-04-02 DIAGNOSIS — N1832 Chronic kidney disease, stage 3b: Secondary | ICD-10-CM

## 2021-04-02 DIAGNOSIS — J449 Chronic obstructive pulmonary disease, unspecified: Secondary | ICD-10-CM | POA: Diagnosis not present

## 2021-04-02 DIAGNOSIS — E538 Deficiency of other specified B group vitamins: Secondary | ICD-10-CM | POA: Diagnosis not present

## 2021-04-02 DIAGNOSIS — R279 Unspecified lack of coordination: Secondary | ICD-10-CM | POA: Diagnosis not present

## 2021-04-02 DIAGNOSIS — D631 Anemia in chronic kidney disease: Secondary | ICD-10-CM

## 2021-04-02 DIAGNOSIS — E1142 Type 2 diabetes mellitus with diabetic polyneuropathy: Secondary | ICD-10-CM

## 2021-04-02 DIAGNOSIS — M4802 Spinal stenosis, cervical region: Secondary | ICD-10-CM | POA: Diagnosis not present

## 2021-04-02 DIAGNOSIS — N183 Chronic kidney disease, stage 3 unspecified: Secondary | ICD-10-CM

## 2021-04-02 DIAGNOSIS — R270 Ataxia, unspecified: Secondary | ICD-10-CM | POA: Diagnosis not present

## 2021-04-02 NOTE — Progress Notes (Signed)
Location:  [facility] Room Number: 150 Place of Service:  SNF (31)   CODE STATUS: dnr  Allergies  Allergen Reactions   Latex Rash   Levofloxacin Nausea And Vomiting   Neomycin-Bacitracin-Polymyxin  [Bacitracin-Neomycin-Polymyxin] Rash   Other Swelling and Rash    TOMATO  SOUP-PER MAR   Prednisone Hives, Itching, Rash and Other (See Comments)   Fish Allergy     rash   Shellfish Allergy Hives   Neosporin [Neomycin-Polymyxin-Gramicidin] Itching and Rash    Chief Complaint  Patient presents with   Annual Exam    HPI:  She is a 75 year old long term resident of this facility being seen for her annual exam. She has not required any hospitalizations or visits to the ED in the past year. She is improving with her adls. She is working with therapy. She tells me that her ultimate goal is to return home. She denies any weakness; no fatigue; no uncontrolled pain. She continues to be followed for her chronic illnesses including: Diabetic peripheral neuropathy associated with type 2 diabetes mellitus:  Type 2 diabetes mellitus with stage 3 chronic kidney disease without long term current use of insulin   Other chronic gastritis without hemorrhage Hypertension associated with stage 3 chronic kidney disease due to type 2 diabetes mellitus  Past Medical History:  Diagnosis Date   Allergy    Anemia    pernicious anemia   Arthritis    Asthma    Blood transfusion without reported diagnosis    CKD (chronic kidney disease)    COPD (chronic obstructive pulmonary disease) ([place]) 12/23/2019   Gastritis    GERD (gastroesophageal reflux disease)    Glaucoma    History of blood in urine    Hyperlipidemia    Hypertension    Neuropathy    both feet   Ocular hypertension    Osteoporosis    Stroke ([place])    Type 2 diabetes mellitus ([place]) 12/23/2019   Vitamin B12 deficiency anemia due to intrinsic factor deficiency     Past Surgical History:  Procedure Laterality Date    CHOLECYSTECTOMY  2008   COLONOSCOPY  04/30/2011   Procedure: COLONOSCOPY;  Surgeon: Rogene Houston, MD;  [facility];  Service: Endoscopy;  Laterality: N/A;   COLONOSCOPY  05/25/2012   Procedure: COLONOSCOPY;  Surgeon: Rogene Houston, MD;  [facility];  Service: Endoscopy;  Laterality: N/A;  1:25-changed to 1200 Ann to notify pt   COLONOSCOPY Bilateral 12/2017   COLONOSCOPY WITH PROPOFOL N/A 11/29/2020   Procedure: COLONOSCOPY WITH PROPOFOL;  Surgeon: Harvel Quale, MD;  [facility];  Service: Gastroenterology;  Laterality: N/A;  1:35 [facility] is in the [facility]   ESOPHAGOGASTRODUODENOSCOPY N/A 12/07/2019   Procedure: ESOPHAGOGASTRODUODENOSCOPY (EGD);  Surgeon: Rogene Houston, MD;  [facility];  Service: Endoscopy;  Laterality: N/A;  155   GALLBLADDER SURGERY     right breast cystectomy  1988   TUBAL LIGATION  1975    Social History   Socioeconomic History   Marital status: Divorced    Spouse name: Not on file   Number of children: 4   Years of education: 9   Highest education level: Not on file  Occupational History   Occupation: retired   Tobacco Use   Smoking status: Former    Packs/day: 1.00    Years: 40.00    Pack years: 40.00    Types: Cigarettes  Quit date: 04/03/2011    Years since quitting: 10.0   Smokeless tobacco: Never  Vaping Use   Vaping Use: Never used  Substance and Sexual Activity   Alcohol use: No   Drug use: No   Sexual activity: Not Currently  Other Topics Concern   Not on file  Social History Narrative   01/17/20 lives at [facility], [place]    Social Determinants of Health   Financial Resource Strain: Not on file  Food Insecurity: Not on file  Transportation Needs: Not on file  Physical Activity: Not on file  Stress: Not on file  Social Connections: Not on file  Intimate Partner Violence: Not on file   Family History  Problem Relation Age of Onset   Diabetes Mother     Hyperlipidemia Mother    Hypertension Mother    Heart disease Father    Hyperlipidemia Father    Diabetes Sister    Hyperlipidemia Sister    Hypertension Sister    Cancer Brother    Heart disease Brother    Hyperlipidemia Brother    Hypertension Brother    Diabetes Sister       VITAL SIGNS BP 136/68   Pulse 67   Temp 98.4 F (36.9 C)   Resp 18   Ht 5\' 4"  (1.626 m)   Wt 104 lb 3.2 oz (47.3 kg)   BMI 17.89 kg/m   Outpatient Encounter Medications as of 04/02/2021  Medication Sig   acetaminophen (TYLENOL) 325 MG tablet Take 2 tablets (650 mg total) by mouth every 6 (six) hours as needed for mild pain (or Fever >/= 101).   albuterol (VENTOLIN HFA) 108 (90 Base) MCG/ACT inhaler Inhale 2 puffs into the lungs every 4 (four) hours as needed for wheezing or shortness of breath.   amLODipine (NORVASC) 10 MG tablet Take 1 tablet (10 mg total) by mouth daily.   aspirin EC 81 MG tablet Take 81 mg by mouth daily. Swallow whole.   Balsam [place]-Castor Oil (VENELEX) OINT Apply 1 application topically as needed. Apply to sacrum and bilateral buttocks Every Shift - PRN; PRN 1, PRN 2, PRN 3   cyanocobalamin (,VITAMIN B-12,) 1000 MCG/ML injection Inject 1 mL (1,000 mcg total) into the muscle every 30 (thirty) days.   docusate sodium (COLACE) 100 MG capsule Take 1 capsule (100 mg total) by mouth 2 (two) times daily.   dorzolamide (TRUSOPT) 2 % ophthalmic solution Place 1 drop into both eyes 2 (two) times daily. (6314 & 9702)   folic acid (FOLVITE) 1 MG tablet Take 1 mg by mouth daily. (0900)   gabapentin (NEURONTIN) 100 MG capsule Take 100 mg by mouth at bedtime. (2100)   Iron, Ferrous Sulfate, 325 (65 Fe) MG TABS Take by mouth. amt: 1 tab; oral,Once A Day   latanoprost (XALATAN) 0.005 % ophthalmic solution Place 1 drop into both eyes at bedtime. (2100)   loperamide (IMODIUM A-D) 2 MG tablet Take 2 mg by mouth every 4 (four) hours as needed for diarrhea or loose stools.   metoCLOPramide (REGLAN) 5  MG tablet Take 1 tablet (5 mg total) by mouth 3 (three) times daily before meals.   NON FORMULARY Regular thin liquids  ALLERGIC TO FISH   omeprazole (PRILOSEC) 40 MG capsule Take 40 mg by mouth daily at 6 (six) AM. (0600)   ondansetron (ZOFRAN) 4 MG tablet Take 4 mg by mouth every 6 (six) hours as needed for nausea or vomiting.   Wound Dressings (ALLEVYN LIFE HEEL) PADS  Apply 1 patch topically as directed. Every 5 days and as needed for prevention   No facility-administered encounter medications on file as of 04/02/2021.     SIGNIFICANT DIAGNOSTIC EXAMS  PREVIOUS  05-24-20: dexa: t score -2.387   NO NEW EXAMS.     LABS REVIEWED PREVIOUS   03-20-20; wbc 10.9; hgb 10.5; hct 34.3; mcv 89.8 plt 293; glucose 114; bun 18; creat 1.11; k+ 4.4; na++ 139; ca 9.5 liver normal albumin 3.4 vit B 12: 1376; vit D 45.43  04-03-20: wbc 8.7; hgb 10.5; hct 34.3; mcv 91.7 plt 324 04-17-20: wbc 7.6; hgb 10.4; hct 32.9; mcv 92.2 plt 270 05-02-20: wbc 7.2; hgb 10.3; hct 33.0; mcv 94.3 plt 243 05-16-20: wbc 7.1; hgb 11.3; hct 35.5; mcv 92.9 plt 255 05-20-20: hgb a1c 4.8; hcol 197; ldl 130; trig 70 hdl 53 urine micro-albumin 15.8 05-30-20: wbc 7.5; hgb 10.8; hct 34.3; mcv 92.5 plt 248   08-27-20: wbc 7.8; hgb 11.0; hct 35.0; mcv 994.6 plt 218 09-05-20: tsh 2.291 free t4: 0.96 09-10-20: wbc 6.8; hgb 10.2; hct 33.2; mcv 94.3 plt 193 10-08-20: wbc 6.6; hgb 10.2; hct 33.0; mcv 94.8 plt 180;  10-21-20: chol 169; ldl 103; trig 83 hdl 49; hgb a1c 5.0 11-07-20: glucose 87; bun 24; creat 1.15; k+ 3.8; na++ 141; ca 9.2 GFR 49; phos 3.9; albumin 3.6; iron 31; TIBC 185; urine micro-albumin 21.9 11-19-20: wbc 5.8; hgb 10.1; hct 33.1; mcv 94.0 plt 199   TODAY  01-28-21: wbc 6.9; hgb 11.3; hct 35.6; mcv 93.7 plt 193 01-30-21: hgb a1c 5.2 02-07-21: wbc 7.1; hgb 11.3; hct 34.6; mcv 92.3 plt 185; glucose 117; bun 24; creat 1.16; k+ 4.2; na++ 134; ca 9.4 GFR 49; phos 3.8; albumin 4.2; vit D 27.34 PTH 22; iron 32; tibc 226 02-25-21:  vit B 12: 764; folate >100 03-25-21: wbc 8.8; hgb 11.4; hct 36.7; mcv 94.6 plt 203   Review of Systems  Constitutional:  Negative for malaise/fatigue.  Respiratory:  Negative for cough and shortness of breath.   Cardiovascular:  Negative for chest pain, palpitations and leg swelling.  Gastrointestinal:  Negative for abdominal pain, constipation and heartburn.  Musculoskeletal:  Negative for back pain, joint pain and myalgias.  Skin: Negative.   Neurological:  Negative for dizziness.  Psychiatric/Behavioral:  The patient is not nervous/anxious.     Physical Exam Constitutional:      General: She is not in acute distress.    Appearance: She is well-developed. She is not diaphoretic.  HENT:     Nose: Nose normal.     Mouth/Throat:     Mouth: Mucous membranes are moist.     Pharynx: Oropharynx is clear.  Eyes:     Conjunctiva/sclera: Conjunctivae normal.  Neck:     Thyroid: No thyromegaly.  Cardiovascular:     Rate and Rhythm: Normal rate and regular rhythm.     Pulses: Normal pulses.     Heart sounds: Normal heart sounds.  Pulmonary:     Effort: Pulmonary effort is normal. No respiratory distress.     Breath sounds: Normal breath sounds.  Abdominal:     General: Abdomen is flat. Bowel sounds are normal. There is no distension.     Palpations: Abdomen is soft.     Tenderness: There is no abdominal tenderness.  Musculoskeletal:        General: Normal range of motion.     Cervical back: Neck supple.     Right lower leg: No edema.  Left lower leg: No edema.  Lymphadenopathy:     Cervical: No cervical adenopathy.  Skin:    General: Skin is warm and dry.  Neurological:     Mental Status: She is alert and oriented to person, place, and time.  Psychiatric:        Mood and Affect: Mood normal.     ASSESSMENT/ PLAN:  TODAY    Failure to thrive in adult: is improving weight is 104 pounds; will continue supplements as ordered; cannot take megace due to DVT  2. Aortic  atherosclerosis: is stable (ct 12-23-19) will monitor  3. History of DVT of left lower extremity: is stable (02-02-20) induced by megace is on long term asa 81 mg daily   4. Chronic obstructive pulmonary disease unspecified COPD type: is stable will continue albuterol 2 puffs every 4 hours as needed  5. Peripheral neuropathy: is stable continue gabapentin 100 mg nightly   6. CKD stage 3 due to diabetes mellitus type 2: bun 24; creat 1.16; GFR 49  7. Diabetic peripheral neuropathy associated with type 2 diabetes mellitus: is stable will continue gabapentin 100 mg nightly   8. Type 2 diabetes mellitus with stage 3 chronic kidney disease without long term current use of insulin hgb a1c 5.2 is on asa 81 mg daily   9. Other chronic gastritis without hemorrhage is stable will continue prilosec 40 mg daily and reglan 5 mg with meals.   10. Hypertension associated with stage 3 chronic kidney disease due to type 2 diabetes mellitus: is stable b/p 136/68 will continue norvasc 10 mg daily   11. Anemia due to chronic renal failure treated with erythropoietin stage 3: hgb 11.4 is followed by oncology   12. Increased intraocular pressure bilateral: is stable will continue trusopt to both eyes twice daily and xaltatan to both eyes nightly   13. Vit B 12 deficiency is stable will continue monthly injections and folic acid 1 mg daily   14. Vitamin D deficiency: level is 27.34: will begin vitamin D 50,000 weekly   15. Thyroiditis: is stable tsh 2.291 free t4: 0.96  16. Cervical myelopathy: is without change; is presently being treated in therapy will monitor       Ok Edwards NP [place] [phone] Monday through Friday 8am- 5pm  After hours call [phone]

## 2021-04-03 DIAGNOSIS — R279 Unspecified lack of coordination: Secondary | ICD-10-CM | POA: Diagnosis not present

## 2021-04-03 DIAGNOSIS — M4802 Spinal stenosis, cervical region: Secondary | ICD-10-CM | POA: Diagnosis not present

## 2021-04-03 DIAGNOSIS — R270 Ataxia, unspecified: Secondary | ICD-10-CM | POA: Diagnosis not present

## 2021-04-04 DIAGNOSIS — R279 Unspecified lack of coordination: Secondary | ICD-10-CM | POA: Diagnosis not present

## 2021-04-04 DIAGNOSIS — R270 Ataxia, unspecified: Secondary | ICD-10-CM | POA: Diagnosis not present

## 2021-04-04 DIAGNOSIS — M4802 Spinal stenosis, cervical region: Secondary | ICD-10-CM | POA: Diagnosis not present

## 2021-04-07 ENCOUNTER — Encounter: Payer: Self-pay | Admitting: Adult Health

## 2021-04-07 DIAGNOSIS — R279 Unspecified lack of coordination: Secondary | ICD-10-CM | POA: Diagnosis not present

## 2021-04-07 DIAGNOSIS — M4802 Spinal stenosis, cervical region: Secondary | ICD-10-CM | POA: Diagnosis not present

## 2021-04-07 DIAGNOSIS — R270 Ataxia, unspecified: Secondary | ICD-10-CM | POA: Diagnosis not present

## 2021-04-07 DIAGNOSIS — Z86718 Personal history of other venous thrombosis and embolism: Secondary | ICD-10-CM | POA: Insufficient documentation

## 2021-04-07 DIAGNOSIS — E1142 Type 2 diabetes mellitus with diabetic polyneuropathy: Secondary | ICD-10-CM

## 2021-04-07 HISTORY — DX: Type 2 diabetes mellitus with diabetic polyneuropathy: E11.42

## 2021-04-08 DIAGNOSIS — M4802 Spinal stenosis, cervical region: Secondary | ICD-10-CM | POA: Diagnosis not present

## 2021-04-08 DIAGNOSIS — R270 Ataxia, unspecified: Secondary | ICD-10-CM | POA: Diagnosis not present

## 2021-04-08 DIAGNOSIS — R279 Unspecified lack of coordination: Secondary | ICD-10-CM | POA: Diagnosis not present

## 2021-04-09 DIAGNOSIS — R279 Unspecified lack of coordination: Secondary | ICD-10-CM | POA: Diagnosis not present

## 2021-04-09 DIAGNOSIS — R270 Ataxia, unspecified: Secondary | ICD-10-CM | POA: Diagnosis not present

## 2021-04-09 DIAGNOSIS — M4802 Spinal stenosis, cervical region: Secondary | ICD-10-CM | POA: Diagnosis not present

## 2021-04-10 DIAGNOSIS — R279 Unspecified lack of coordination: Secondary | ICD-10-CM | POA: Diagnosis not present

## 2021-04-10 DIAGNOSIS — R270 Ataxia, unspecified: Secondary | ICD-10-CM | POA: Diagnosis not present

## 2021-04-10 DIAGNOSIS — M4802 Spinal stenosis, cervical region: Secondary | ICD-10-CM | POA: Diagnosis not present

## 2021-04-11 DIAGNOSIS — R270 Ataxia, unspecified: Secondary | ICD-10-CM | POA: Diagnosis not present

## 2021-04-11 DIAGNOSIS — R279 Unspecified lack of coordination: Secondary | ICD-10-CM | POA: Diagnosis not present

## 2021-04-11 DIAGNOSIS — M4802 Spinal stenosis, cervical region: Secondary | ICD-10-CM | POA: Diagnosis not present

## 2021-04-14 DIAGNOSIS — R270 Ataxia, unspecified: Secondary | ICD-10-CM | POA: Diagnosis not present

## 2021-04-14 DIAGNOSIS — I1310 Hypertensive heart and chronic kidney disease without heart failure, with stage 1 through stage 4 chronic kidney disease, or unspecified chronic kidney disease: Secondary | ICD-10-CM | POA: Diagnosis not present

## 2021-04-14 DIAGNOSIS — R279 Unspecified lack of coordination: Secondary | ICD-10-CM | POA: Diagnosis not present

## 2021-04-14 DIAGNOSIS — M4802 Spinal stenosis, cervical region: Secondary | ICD-10-CM | POA: Diagnosis not present

## 2021-04-14 DIAGNOSIS — M6281 Muscle weakness (generalized): Secondary | ICD-10-CM | POA: Diagnosis not present

## 2021-04-14 DIAGNOSIS — Z741 Need for assistance with personal care: Secondary | ICD-10-CM | POA: Diagnosis not present

## 2021-04-15 DIAGNOSIS — M6281 Muscle weakness (generalized): Secondary | ICD-10-CM | POA: Diagnosis not present

## 2021-04-15 DIAGNOSIS — R279 Unspecified lack of coordination: Secondary | ICD-10-CM | POA: Diagnosis not present

## 2021-04-15 DIAGNOSIS — Z741 Need for assistance with personal care: Secondary | ICD-10-CM | POA: Diagnosis not present

## 2021-04-15 DIAGNOSIS — M4802 Spinal stenosis, cervical region: Secondary | ICD-10-CM | POA: Diagnosis not present

## 2021-04-15 DIAGNOSIS — R270 Ataxia, unspecified: Secondary | ICD-10-CM | POA: Diagnosis not present

## 2021-04-15 DIAGNOSIS — I1310 Hypertensive heart and chronic kidney disease without heart failure, with stage 1 through stage 4 chronic kidney disease, or unspecified chronic kidney disease: Secondary | ICD-10-CM | POA: Diagnosis not present

## 2021-04-16 DIAGNOSIS — Z23 Encounter for immunization: Secondary | ICD-10-CM | POA: Diagnosis not present

## 2021-04-16 DIAGNOSIS — M6281 Muscle weakness (generalized): Secondary | ICD-10-CM | POA: Diagnosis not present

## 2021-04-16 DIAGNOSIS — R279 Unspecified lack of coordination: Secondary | ICD-10-CM | POA: Diagnosis not present

## 2021-04-16 DIAGNOSIS — Z741 Need for assistance with personal care: Secondary | ICD-10-CM | POA: Diagnosis not present

## 2021-04-16 DIAGNOSIS — I1310 Hypertensive heart and chronic kidney disease without heart failure, with stage 1 through stage 4 chronic kidney disease, or unspecified chronic kidney disease: Secondary | ICD-10-CM | POA: Diagnosis not present

## 2021-04-16 DIAGNOSIS — R270 Ataxia, unspecified: Secondary | ICD-10-CM | POA: Diagnosis not present

## 2021-04-16 DIAGNOSIS — M4802 Spinal stenosis, cervical region: Secondary | ICD-10-CM | POA: Diagnosis not present

## 2021-04-17 DIAGNOSIS — M4802 Spinal stenosis, cervical region: Secondary | ICD-10-CM | POA: Diagnosis not present

## 2021-04-17 DIAGNOSIS — M6281 Muscle weakness (generalized): Secondary | ICD-10-CM | POA: Diagnosis not present

## 2021-04-17 DIAGNOSIS — Z741 Need for assistance with personal care: Secondary | ICD-10-CM | POA: Diagnosis not present

## 2021-04-17 DIAGNOSIS — I1310 Hypertensive heart and chronic kidney disease without heart failure, with stage 1 through stage 4 chronic kidney disease, or unspecified chronic kidney disease: Secondary | ICD-10-CM | POA: Diagnosis not present

## 2021-04-17 DIAGNOSIS — R270 Ataxia, unspecified: Secondary | ICD-10-CM | POA: Diagnosis not present

## 2021-04-17 DIAGNOSIS — R279 Unspecified lack of coordination: Secondary | ICD-10-CM | POA: Diagnosis not present

## 2021-04-18 DIAGNOSIS — M4802 Spinal stenosis, cervical region: Secondary | ICD-10-CM | POA: Diagnosis not present

## 2021-04-18 DIAGNOSIS — R270 Ataxia, unspecified: Secondary | ICD-10-CM | POA: Diagnosis not present

## 2021-04-18 DIAGNOSIS — M6281 Muscle weakness (generalized): Secondary | ICD-10-CM | POA: Diagnosis not present

## 2021-04-18 DIAGNOSIS — Z741 Need for assistance with personal care: Secondary | ICD-10-CM | POA: Diagnosis not present

## 2021-04-18 DIAGNOSIS — R279 Unspecified lack of coordination: Secondary | ICD-10-CM | POA: Diagnosis not present

## 2021-04-18 DIAGNOSIS — I1310 Hypertensive heart and chronic kidney disease without heart failure, with stage 1 through stage 4 chronic kidney disease, or unspecified chronic kidney disease: Secondary | ICD-10-CM | POA: Diagnosis not present

## 2021-04-21 DIAGNOSIS — R279 Unspecified lack of coordination: Secondary | ICD-10-CM | POA: Diagnosis not present

## 2021-04-21 DIAGNOSIS — I1310 Hypertensive heart and chronic kidney disease without heart failure, with stage 1 through stage 4 chronic kidney disease, or unspecified chronic kidney disease: Secondary | ICD-10-CM | POA: Diagnosis not present

## 2021-04-21 DIAGNOSIS — Z741 Need for assistance with personal care: Secondary | ICD-10-CM | POA: Diagnosis not present

## 2021-04-21 DIAGNOSIS — R270 Ataxia, unspecified: Secondary | ICD-10-CM | POA: Diagnosis not present

## 2021-04-21 DIAGNOSIS — M6281 Muscle weakness (generalized): Secondary | ICD-10-CM | POA: Diagnosis not present

## 2021-04-21 DIAGNOSIS — M4802 Spinal stenosis, cervical region: Secondary | ICD-10-CM | POA: Diagnosis not present

## 2021-04-22 ENCOUNTER — Inpatient Hospital Stay (HOSPITAL_COMMUNITY): Payer: Medicare Other

## 2021-04-22 ENCOUNTER — Inpatient Hospital Stay (HOSPITAL_COMMUNITY): Payer: Medicare Other | Attending: Hematology

## 2021-04-22 DIAGNOSIS — E538 Deficiency of other specified B group vitamins: Secondary | ICD-10-CM | POA: Diagnosis not present

## 2021-04-22 DIAGNOSIS — D631 Anemia in chronic kidney disease: Secondary | ICD-10-CM | POA: Insufficient documentation

## 2021-04-22 DIAGNOSIS — M6281 Muscle weakness (generalized): Secondary | ICD-10-CM | POA: Diagnosis not present

## 2021-04-22 DIAGNOSIS — N183 Chronic kidney disease, stage 3 unspecified: Secondary | ICD-10-CM | POA: Insufficient documentation

## 2021-04-22 DIAGNOSIS — R279 Unspecified lack of coordination: Secondary | ICD-10-CM | POA: Diagnosis not present

## 2021-04-22 DIAGNOSIS — R270 Ataxia, unspecified: Secondary | ICD-10-CM | POA: Diagnosis not present

## 2021-04-22 DIAGNOSIS — Z741 Need for assistance with personal care: Secondary | ICD-10-CM | POA: Diagnosis not present

## 2021-04-22 DIAGNOSIS — Z86718 Personal history of other venous thrombosis and embolism: Secondary | ICD-10-CM | POA: Insufficient documentation

## 2021-04-22 DIAGNOSIS — I1310 Hypertensive heart and chronic kidney disease without heart failure, with stage 1 through stage 4 chronic kidney disease, or unspecified chronic kidney disease: Secondary | ICD-10-CM | POA: Diagnosis not present

## 2021-04-22 DIAGNOSIS — M4802 Spinal stenosis, cervical region: Secondary | ICD-10-CM | POA: Diagnosis not present

## 2021-04-22 LAB — CBC WITH DIFFERENTIAL/PLATELET
Abs Immature Granulocytes: 0.03 10*3/uL (ref 0.00–0.07)
Basophils Absolute: 0.1 10*3/uL (ref 0.0–0.1)
Basophils Relative: 1 %
Eosinophils Absolute: 0.6 10*3/uL — ABNORMAL HIGH (ref 0.0–0.5)
Eosinophils Relative: 8 %
HCT: 36.5 % (ref 36.0–46.0)
Hemoglobin: 11.3 g/dL — ABNORMAL LOW (ref 12.0–15.0)
Immature Granulocytes: 0 %
Lymphocytes Relative: 31 %
Lymphs Abs: 2.3 10*3/uL (ref 0.7–4.0)
MCH: 28.9 pg (ref 26.0–34.0)
MCHC: 31 g/dL (ref 30.0–36.0)
MCV: 93.4 fL (ref 80.0–100.0)
Monocytes Absolute: 0.5 10*3/uL (ref 0.1–1.0)
Monocytes Relative: 6 %
Neutro Abs: 4 10*3/uL (ref 1.7–7.7)
Neutrophils Relative %: 54 %
Platelets: 165 10*3/uL (ref 150–400)
RBC: 3.91 MIL/uL (ref 3.87–5.11)
RDW: 14.5 % (ref 11.5–15.5)
WBC: 7.5 10*3/uL (ref 4.0–10.5)
nRBC: 0 % (ref 0.0–0.2)

## 2021-04-22 NOTE — Progress Notes (Signed)
Retacrit held per parameters for Hgb 11.3.  Treatment given today per MD orders.  Discharge from clinic ambulatory in stable condition.  Alert and oriented X 3.  Follow up with [facility] as scheduled.

## 2021-04-23 DIAGNOSIS — M4802 Spinal stenosis, cervical region: Secondary | ICD-10-CM | POA: Diagnosis not present

## 2021-04-23 DIAGNOSIS — I1310 Hypertensive heart and chronic kidney disease without heart failure, with stage 1 through stage 4 chronic kidney disease, or unspecified chronic kidney disease: Secondary | ICD-10-CM | POA: Diagnosis not present

## 2021-04-23 DIAGNOSIS — M6281 Muscle weakness (generalized): Secondary | ICD-10-CM | POA: Diagnosis not present

## 2021-04-23 DIAGNOSIS — R279 Unspecified lack of coordination: Secondary | ICD-10-CM | POA: Diagnosis not present

## 2021-04-23 DIAGNOSIS — R270 Ataxia, unspecified: Secondary | ICD-10-CM | POA: Diagnosis not present

## 2021-04-23 DIAGNOSIS — Z741 Need for assistance with personal care: Secondary | ICD-10-CM | POA: Diagnosis not present

## 2021-04-24 DIAGNOSIS — R279 Unspecified lack of coordination: Secondary | ICD-10-CM | POA: Diagnosis not present

## 2021-04-24 DIAGNOSIS — Z741 Need for assistance with personal care: Secondary | ICD-10-CM | POA: Diagnosis not present

## 2021-04-24 DIAGNOSIS — I1310 Hypertensive heart and chronic kidney disease without heart failure, with stage 1 through stage 4 chronic kidney disease, or unspecified chronic kidney disease: Secondary | ICD-10-CM | POA: Diagnosis not present

## 2021-04-24 DIAGNOSIS — R270 Ataxia, unspecified: Secondary | ICD-10-CM | POA: Diagnosis not present

## 2021-04-24 DIAGNOSIS — M4802 Spinal stenosis, cervical region: Secondary | ICD-10-CM | POA: Diagnosis not present

## 2021-04-24 DIAGNOSIS — M6281 Muscle weakness (generalized): Secondary | ICD-10-CM | POA: Diagnosis not present

## 2021-04-25 DIAGNOSIS — M6281 Muscle weakness (generalized): Secondary | ICD-10-CM | POA: Diagnosis not present

## 2021-04-25 DIAGNOSIS — R279 Unspecified lack of coordination: Secondary | ICD-10-CM | POA: Diagnosis not present

## 2021-04-25 DIAGNOSIS — I1310 Hypertensive heart and chronic kidney disease without heart failure, with stage 1 through stage 4 chronic kidney disease, or unspecified chronic kidney disease: Secondary | ICD-10-CM | POA: Diagnosis not present

## 2021-04-25 DIAGNOSIS — M4802 Spinal stenosis, cervical region: Secondary | ICD-10-CM | POA: Diagnosis not present

## 2021-04-25 DIAGNOSIS — R270 Ataxia, unspecified: Secondary | ICD-10-CM | POA: Diagnosis not present

## 2021-04-25 DIAGNOSIS — Z741 Need for assistance with personal care: Secondary | ICD-10-CM | POA: Diagnosis not present

## 2021-04-30 ENCOUNTER — Non-Acute Institutional Stay (SKILLED_NURSING_FACILITY): Payer: Medicare Other | Admitting: Adult Health

## 2021-04-30 ENCOUNTER — Encounter: Payer: Self-pay | Admitting: Adult Health

## 2021-04-30 DIAGNOSIS — N1832 Chronic kidney disease, stage 3b: Secondary | ICD-10-CM

## 2021-04-30 DIAGNOSIS — E1122 Type 2 diabetes mellitus with diabetic chronic kidney disease: Secondary | ICD-10-CM

## 2021-04-30 DIAGNOSIS — N183 Chronic kidney disease, stage 3 unspecified: Secondary | ICD-10-CM | POA: Diagnosis not present

## 2021-04-30 DIAGNOSIS — E1142 Type 2 diabetes mellitus with diabetic polyneuropathy: Secondary | ICD-10-CM | POA: Diagnosis not present

## 2021-04-30 NOTE — Progress Notes (Signed)
Location:  [facility] Room Number: 150-P Place of Service:  SNF (31) Provider: Ok Edwards, NP  CODE STATUS: DNR  Allergies  Allergen Reactions   Latex Rash   Levofloxacin Nausea And Vomiting   Neomycin-Bacitracin-Polymyxin  [Bacitracin-Neomycin-Polymyxin] Rash   Other Swelling and Rash    TOMATO  SOUP-PER MAR   Prednisone Hives, Itching, Rash and Other (See Comments)   Fish Allergy     rash   Shellfish Allergy Hives   Neosporin [Neomycin-Polymyxin-Gramicidin] Itching and Rash    Chief Complaint  Patient presents with   Medical Management of Chronic Issues                CKD stage 3 due to diabetes mellitus type 2:  Diabetic peripheral neuropathy associated with type 2 diabetes mellitus  Type 2 diabetes mellitus with stage 3 chronic kidney disease without long term current use of insulin    HPI:  She is a 75 year old long term resident of this facility being seen for the management of her chronic illnesses: CKD stage 3 due to diabetes mellitus type 2:  Diabetic peripheral neuropathy associated with type 2 diabetes mellitus  Type 2 diabetes mellitus with stage 3 chronic kidney disease without long term current use of insulin.  There are no reports of uncontrolled pain. There are no reports of changes in appetite; weight is without significant change   Past Medical History:  Diagnosis Date   Allergy    Anemia    pernicious anemia   Arthritis    Asthma    Blood transfusion without reported diagnosis    CKD (chronic kidney disease)    COPD (chronic obstructive pulmonary disease) ([place]) 12/23/2019   Diabetic peripheral neuropathy associated with type 2 diabetes mellitus ([place]) 04/07/2021   Gastritis    GERD (gastroesophageal reflux disease)    Glaucoma    History of blood in urine    Hyperlipidemia    Hypertension    Neuropathy    both feet   Ocular hypertension    Osteoporosis    Stroke ([place])    Type 2 diabetes mellitus ([place]) 12/23/2019   Vitamin  B12 deficiency anemia due to intrinsic factor deficiency     Past Surgical History:  Procedure Laterality Date   CHOLECYSTECTOMY  2008   COLONOSCOPY  04/30/2011   Procedure: COLONOSCOPY;  Surgeon: Rogene Houston, MD;  [facility];  Service: Endoscopy;  Laterality: N/A;   COLONOSCOPY  05/25/2012   Procedure: COLONOSCOPY;  Surgeon: Rogene Houston, MD;  [facility];  Service: Endoscopy;  Laterality: N/A;  1:25-changed to 1200 Ann to notify pt   COLONOSCOPY Bilateral 12/2017   COLONOSCOPY WITH PROPOFOL N/A 11/29/2020   Procedure: COLONOSCOPY WITH PROPOFOL;  Surgeon: Harvel Quale, MD;  [facility]: [facility];  Service: Gastroenterology;  Laterality: N/A;  1:35 patient is in the [facility]   ESOPHAGOGASTRODUODENOSCOPY N/A 12/07/2019   Procedure: ESOPHAGOGASTRODUODENOSCOPY (EGD);  Surgeon: Rogene Houston, MD;  [facility];  Service: Endoscopy;  Laterality: N/A;  155   GALLBLADDER SURGERY     right breast cystectomy  1988   TUBAL LIGATION  1975    Social History   Socioeconomic History   Marital status: Divorced    Spouse name: Not on file   Number of children: 4   Years of education: 9   Highest education level: Not on file  Occupational History   Occupation: retired   Tobacco Use  Smoking status: Former    Packs/day: 1.00    Years: 40.00    Pack years: 40.00    Types: Cigarettes    Quit date: 04/03/2011    Years since quitting: 10.0   Smokeless tobacco: Never  Vaping Use   Vaping Use: Never used  Substance and Sexual Activity   Alcohol use: No   Drug use: No   Sexual activity: Not Currently  Other Topics Concern   Not on file  Social History Narrative   01/17/20 lives at [facility], [place]   Social Determinants of Health   Financial Resource Strain: Not on file  Food Insecurity: Not on file  Transportation Needs: Not on file  Physical Activity: Not on file  Stress: Not on file  Social Connections: Not on  file  Intimate Partner Violence: Not on file   Family History  Problem Relation Age of Onset   Diabetes Mother    Hyperlipidemia Mother    Hypertension Mother    Heart disease Father    Hyperlipidemia Father    Diabetes Sister    Hyperlipidemia Sister    Hypertension Sister    Cancer Brother    Heart disease Brother    Hyperlipidemia Brother    Hypertension Brother    Diabetes Sister       VITAL SIGNS BP (!) 123/54   Pulse 81   Temp (!) 96.8 F (36 C)   Resp 20   Ht 5\' 4"  (1.626 m)   Wt 101 lb 12.8 oz (46.2 kg)   SpO2 93%   BMI 17.47 kg/m   Outpatient Encounter Medications as of 04/30/2021  Medication Sig   acetaminophen (TYLENOL) 325 MG tablet Take 2 tablets (650 mg total) by mouth every 6 (six) hours as needed for mild pain (or Fever >/= 101).   albuterol (VENTOLIN HFA) 108 (90 Base) MCG/ACT inhaler Inhale 2 puffs into the lungs every 4 (four) hours as needed for wheezing or shortness of breath.   amLODipine (NORVASC) 10 MG tablet Take 1 tablet (10 mg total) by mouth daily.   aspirin EC 81 MG tablet Take 81 mg by mouth daily. Swallow whole.   Balsam [place]-Castor Oil (VENELEX) OINT Apply 1 application topically as needed. Apply to sacrum and bilateral buttocks Every Shift - PRN; PRN 1, PRN 2, PRN 3   cyanocobalamin (,VITAMIN B-12,) 1000 MCG/ML injection Inject 1 mL (1,000 mcg total) into the muscle every 30 (thirty) days.   docusate sodium (COLACE) 100 MG capsule Take 1 capsule (100 mg total) by mouth 2 (two) times daily.   dorzolamide (TRUSOPT) 2 % ophthalmic solution Place 1 drop into both eyes 2 (two) times daily. (0900 & 1700)   ergocalciferol (VITAMIN D2) 1.25 MG (50000 UT) capsule Take 50,000 Units by mouth once a week. Thursday   folic acid (FOLVITE) 1 MG tablet Take 1 mg by mouth daily. (0900)   gabapentin (NEURONTIN) 100 MG capsule Take 100 mg by mouth at bedtime. (2100)   Iron, Ferrous Sulfate, 325 (65 Fe) MG TABS Take by mouth. amt: 1 tab; oral,Once A Day    latanoprost (XALATAN) 0.005 % ophthalmic solution Place 1 drop into both eyes at bedtime. (2100)   loperamide (IMODIUM A-D) 2 MG tablet Take 2 mg by mouth every 4 (four) hours as needed for diarrhea or loose stools.   metoCLOPramide (REGLAN) 5 MG tablet Take 1 tablet (5 mg total) by mouth 3 (three) times daily before meals.   NON FORMULARY Regular thin liquids  ALLERGIC TO FISH   omeprazole (PRILOSEC) 40 MG capsule Take 40 mg by mouth daily at 6 (six) AM. (0600)   ondansetron (ZOFRAN) 4 MG tablet Take 4 mg by mouth every 6 (six) hours as needed for nausea or vomiting.   Wound Dressings (ALLEVYN LIFE HEEL) PADS Apply 1 patch topically as directed. Every 5 days and as needed for prevention   No facility-administered encounter medications on file as of 04/30/2021.     SIGNIFICANT DIAGNOSTIC EXAMS  PREVIOUS  05-24-20: dexa: t score -2.387  NO NEW EXAMS.     LABS REVIEWED PREVIOUS   04-17-20: wbc 7.6; hgb 10.4; hct 32.9; mcv 92.2 plt 270 05-02-20: wbc 7.2; hgb 10.3; hct 33.0; mcv 94.3 plt 243 05-16-20: wbc 7.1; hgb 11.3; hct 35.5; mcv 92.9 plt 255 05-20-20: hgb a1c 4.8; hcol 197; ldl 130; trig 70 hdl 53 urine micro-albumin 15.8 05-30-20: wbc 7.5; hgb 10.8; hct 34.3; mcv 92.5 plt 248   08-27-20: wbc 7.8; hgb 11.0; hct 35.0; mcv 994.6 plt 218 09-05-20: tsh 2.291 free t4: 0.96 09-10-20: wbc 6.8; hgb 10.2; hct 33.2; mcv 94.3 plt 193 10-08-20: wbc 6.6; hgb 10.2; hct 33.0; mcv 94.8 plt 180;  10-21-20: chol 169; ldl 103; trig 83 hdl 49; hgb a1c 5.0 11-07-20: glucose 87; bun 24; creat 1.15; k+ 3.8; na++ 141; ca 9.2 GFR 49; phos 3.9; albumin 3.6; iron 31; TIBC 185; urine micro-albumin 21.9 11-19-20: wbc 5.8; hgb 10.1; hct 33.1; mcv 94.0 plt 199  01-28-21: wbc 6.9; hgb 11.3; hct 35.6; mcv 93.7 plt 193 01-30-21: hgb a1c 5.2 02-07-21: wbc 7.1; hgb 11.3; hct 34.6; mcv 92.3 plt 185; glucose 117; bun 24; creat 1.16; k+ 4.2; na++ 134; ca 9.4 GFR 49; phos 3.8; albumin 4.2; vit D 27.34 PTH 22; iron 32; tibc  226 02-25-21: vit B 12: 764; folate >100 03-25-21: wbc 8.8; hgb 11.4; hct 36.7; mcv 94.6 plt 203   NO NEW LABS.   Review of Systems  Constitutional:  Negative for malaise/fatigue.  Respiratory:  Negative for cough and shortness of breath.   Cardiovascular:  Negative for chest pain, palpitations and leg swelling.  Gastrointestinal:  Negative for abdominal pain, constipation and heartburn.  Musculoskeletal:  Negative for back pain, joint pain and myalgias.  Skin: Negative.   Neurological:  Negative for dizziness.  Psychiatric/Behavioral:  The patient is not nervous/anxious.    Physical Exam Constitutional:      General: She is not in acute distress.    Appearance: She is well-developed. She is not diaphoretic.  Neck:     Thyroid: No thyromegaly.  Cardiovascular:     Rate and Rhythm: Normal rate and regular rhythm.     Pulses: Normal pulses.     Heart sounds: Normal heart sounds.  Pulmonary:     Effort: Pulmonary effort is normal. No respiratory distress.     Breath sounds: Normal breath sounds.  Abdominal:     General: Bowel sounds are normal. There is no distension.     Palpations: Abdomen is soft.     Tenderness: There is no abdominal tenderness.  Musculoskeletal:        General: Normal range of motion.     Cervical back: Neck supple.     Right lower leg: No edema.     Left lower leg: No edema.  Lymphadenopathy:     Cervical: No cervical adenopathy.  Skin:    General: Skin is warm and dry.  Neurological:     Mental Status: She is alert  and oriented to person, place, and time.  Psychiatric:        Mood and Affect: Mood normal.     ASSESSMENT/ PLAN:  TODAY    1. CKD stage 3 due to diabetes mellitus type 2: bun 24; creat 1.16; GFR 49  2. Diabetic peripheral neuropathy associated with type 2 diabetes mellitus is stable will continue gabapentin 100 mg nightly   3. Type 2 diabetes mellitus with stage 3 chronic kidney disease without long term current use of insulin  hgb a1c is 5.2 is on asa 81 mg daily   PREVIOUS   5. Other chronic gastritis without hemorrhage is stable will continue prilosec 40 mg daily and reglan 5 mg with meals.   6. Hypertension associated with stage 3 chronic kidney disease due to type 2 diabetes mellitus: is stable b/p 136/68 will continue norvasc 10 mg daily   7. Anemia due to chronic renal failure treated with erythropoietin stage 3: hgb 11.4 is followed by oncology   8. Increased intraocular pressure bilateral: is stable will continue trusopt to both eyes twice daily and xaltatan to both eyes nightly   9. Vit B 12 deficiency is stable will continue monthly injections and folic acid 1 mg daily   10. Vitamin D deficiency: level is 27.34: will begin vitamin D 50,000 weekly   11. Thyroiditis: is stable tsh 2.291 free t4: 0.96  12. Cervical myelopathy: is without change; is presently being treated in therapy will monitor   13. Failure to thrive in adult: is improving weight is 101 pounds; will continue supplements as ordered; cannot take megace due to DVT  14. Aortic atherosclerosis: is stable (ct 12-23-19) will monitor  15. History of DVT of left lower extremity: is stable (02-02-20) induced by megace is on long term asa 81 mg daily   16. Chronic obstructive pulmonary disease unspecified COPD type: is stable will continue albuterol 2 puffs every 4 hours as needed   Will check hgb a1c cmp; vit d lipids    Ok Edwards NP [facility]  Contact [phone] Monday through Friday 8am- 5pm  After hours call [phone]

## 2021-05-01 ENCOUNTER — Other Ambulatory Visit (HOSPITAL_COMMUNITY)
Admission: RE | Admit: 2021-05-01 | Discharge: 2021-05-01 | Disposition: A | Discharge status: Home / Self Care | Payer: Medicare Other | Source: Skilled Nursing Facility | Attending: Adult Health | Admitting: Adult Health

## 2021-05-01 DIAGNOSIS — E1159 Type 2 diabetes mellitus with other circulatory complications: Secondary | ICD-10-CM | POA: Diagnosis not present

## 2021-05-01 LAB — COMPREHENSIVE METABOLIC PANEL
ALT: 8 U/L (ref 0–44)
AST: 10 U/L — ABNORMAL LOW (ref 15–41)
Albumin: 3.9 g/dL (ref 3.5–5.0)
Alkaline Phosphatase: 78 U/L (ref 38–126)
Anion gap: 6 (ref 5–15)
BUN: 25 mg/dL — ABNORMAL HIGH (ref 8–23)
CO2: 27 mmol/L (ref 22–32)
Calcium: 8.9 mg/dL (ref 8.9–10.3)
Chloride: 102 mmol/L (ref 98–111)
Creatinine, Ser: 1.17 mg/dL — ABNORMAL HIGH (ref 0.44–1.00)
GFR, Estimated: 48 mL/min — ABNORMAL LOW (ref >60)
Glucose, Bld: 91 mg/dL (ref 70–99)
Potassium: 4.2 mmol/L (ref 3.5–5.1)
Sodium: 135 mmol/L (ref 135–145)
Total Bilirubin: 0.5 mg/dL (ref 0.3–1.2)
Total Protein: 6.9 g/dL (ref 6.5–8.1)

## 2021-05-01 LAB — LIPID PANEL
Cholesterol: 210 mg/dL — ABNORMAL HIGH (ref 0–200)
HDL: 58 mg/dL (ref >40)
LDL Cholesterol: 139 mg/dL — ABNORMAL HIGH (ref 0–99)
Total CHOL/HDL Ratio: 3.6 RATIO
Triglycerides: 66 mg/dL (ref <150)
VLDL: 13 mg/dL (ref 0–40)

## 2021-05-01 LAB — VITAMIN D 25 HYDROXY (VIT D DEFICIENCY, FRACTURES): Vit D, 25-Hydroxy: 41.14 ng/mL (ref 30–100)

## 2021-05-02 LAB — HEMOGLOBIN A1C
Hgb A1c MFr Bld: 5.3 % (ref 4.8–5.6)
Mean Plasma Glucose: 105 mg/dL

## 2021-05-07 DIAGNOSIS — I82402 Acute embolism and thrombosis of unspecified deep veins of left lower extremity: Secondary | ICD-10-CM | POA: Diagnosis not present

## 2021-05-07 DIAGNOSIS — Z1159 Encounter for screening for other viral diseases: Secondary | ICD-10-CM | POA: Diagnosis not present

## 2021-05-12 ENCOUNTER — Non-Acute Institutional Stay (INDEPENDENT_AMBULATORY_CARE_PROVIDER_SITE_OTHER): Payer: Medicare Other | Admitting: Adult Health

## 2021-05-12 ENCOUNTER — Encounter: Payer: Self-pay | Admitting: Adult Health

## 2021-05-12 DIAGNOSIS — Z0000 Encounter for general adult medical examination without abnormal findings: Secondary | ICD-10-CM

## 2021-05-12 NOTE — Progress Notes (Signed)
This encounter was created in error - please disregard.

## 2021-05-12 NOTE — Progress Notes (Signed)
Subjective:   Traci Parker is a 75 y.o. female who presents for Medicare Annual (Subsequent) preventive examination.  Review of Systems    Review of Systems  Constitutional:  Negative for malaise/fatigue.  Respiratory:  Negative for cough and shortness of breath.   Cardiovascular:  Negative for chest pain, palpitations and leg swelling.  Gastrointestinal:  Negative for abdominal pain, constipation and heartburn.  Musculoskeletal:  Negative for back pain, joint pain and myalgias.  Skin: Negative.   Neurological:  Negative for dizziness.  Psychiatric/Behavioral:  The patient is not nervous/anxious.    Cardiac Risk Factors include: advanced age (>32men, >35 women);diabetes mellitus;sedentary lifestyle     Objective:    Today's Vitals   05/12/21 1154  BP: 139/62  Pulse: 69  Temp: 98.1 F (36.7 C)  Weight: 101 lb (45.8 kg)  Height: 5\' 4"  (1.626 m)   Body mass index is 17.34 kg/m.  Advanced Directives 05/12/2021 04/30/2021 03/27/2021 02/25/2021 02/11/2021 01/29/2021 01/14/2021  Does Patient Have a Medical Advance Directive? Yes Yes Yes Yes Yes Yes Yes  Type of Paramedic of [place];Out of facility DNR (pink MOST or yellow form) [place];Living will;Out of facility DNR (pink MOST or yellow form) [place];Out of facility DNR (pink MOST or yellow form) Healthcare Power of [place];Out of facility DNR (pink MOST or yellow form) [place];Out of facility DNR (pink MOST or yellow form) [place];Living will  Does patient want to make changes to medical advance directive? No - Patient declined No - Patient declined No - Patient declined - - No - Patient declined -  Copy of [place] in Chart? Yes - validated most recent copy scanned in chart (See row information) Yes - validated most recent copy scanned in chart (See row information) Yes - validated  most recent copy scanned in chart (See row information) No - copy requested Yes - validated most recent copy scanned in chart (See row information) Yes - validated most recent copy scanned in chart (See row information) No - copy requested  Would patient like information on creating a medical advance directive? - - - No - Patient declined No - Patient declined - No - Patient declined  Pre-existing out of facility DNR order (yellow form or pink MOST form) Yellow form placed in chart (order not valid for inpatient use) - Yellow form placed in chart (order not valid for inpatient use) - - Yellow form placed in chart (order not valid for inpatient use) -    Current Medications (verified) Outpatient Encounter Medications as of 05/12/2021  Medication Sig   acetaminophen (TYLENOL) 325 MG tablet Take 2 tablets (650 mg total) by mouth every 6 (six) hours as needed for mild pain (or Fever >/= 101).   albuterol (VENTOLIN HFA) 108 (90 Base) MCG/ACT inhaler Inhale 2 puffs into the lungs every 4 (four) hours as needed for wheezing or shortness of breath.   amLODipine (NORVASC) 10 MG tablet Take 1 tablet (10 mg total) by mouth daily.   aspirin EC 81 MG tablet Take 81 mg by mouth daily. Swallow whole.   Balsam [place]-Castor Oil (VENELEX) OINT Apply 1 application topically as needed. Apply to sacrum and bilateral buttocks Every Shift - PRN; PRN 1, PRN 2, PRN 3   cyanocobalamin (,VITAMIN B-12,) 1000 MCG/ML injection Inject 1 mL (1,000 mcg total) into the muscle every 30 (thirty) days.   docusate sodium (COLACE) 100  MG capsule Take 1 capsule (100 mg total) by mouth 2 (two) times daily.   dorzolamide (TRUSOPT) 2 % ophthalmic solution Place 1 drop into both eyes 2 (two) times daily. (0900 & 1700)   ergocalciferol (VITAMIN D2) 1.25 MG (50000 UT) capsule Take 50,000 Units by mouth once a week. Thursday   folic acid (FOLVITE) 1 MG tablet Take 1 mg by mouth daily. (0900)   gabapentin (NEURONTIN) 100 MG capsule Take 100 mg by  mouth at bedtime. (2100)   Iron, Ferrous Sulfate, 325 (65 Fe) MG TABS Take by mouth. amt: 1 tab; oral,Once A Day   latanoprost (XALATAN) 0.005 % ophthalmic solution Place 1 drop into both eyes at bedtime. (2100)   loperamide (IMODIUM A-D) 2 MG tablet Take 2 mg by mouth every 4 (four) hours as needed for diarrhea or loose stools.   metoCLOPramide (REGLAN) 5 MG tablet Take 1 tablet (5 mg total) by mouth 3 (three) times daily before meals.   NON FORMULARY Regular thin liquids  ALLERGIC TO FISH   omeprazole (PRILOSEC) 40 MG capsule Take 40 mg by mouth daily at 6 (six) AM. (0600)   ondansetron (ZOFRAN) 4 MG tablet Take 4 mg by mouth every 6 (six) hours as needed for nausea or vomiting.   Wound Dressings (ALLEVYN LIFE HEEL) PADS Apply 1 patch topically as directed. Every 5 days and as needed for prevention   No facility-administered encounter medications on file as of 05/12/2021.    Allergies (verified) Latex, Levofloxacin, Neomycin-bacitracin-polymyxin  [bacitracin-neomycin-polymyxin], Other, Prednisone, Fish allergy, Shellfish allergy, and Neosporin [neomycin-polymyxin-gramicidin]   History: Past Medical History:  Diagnosis Date   Allergy    Anemia    pernicious anemia   Arthritis    Asthma    Blood transfusion without reported diagnosis    CKD (chronic kidney disease)    COPD (chronic obstructive pulmonary disease) ([place]) 12/23/2019   Diabetic peripheral neuropathy associated with type 2 diabetes mellitus ([place]) 04/07/2021   Gastritis    GERD (gastroesophageal reflux disease)    Glaucoma    History of blood in urine    Hyperlipidemia    Hypertension    Neuropathy    both feet   Ocular hypertension    Osteoporosis    Stroke ([place])    Type 2 diabetes mellitus ([place]) 12/23/2019   Vitamin B12 deficiency anemia due to intrinsic factor deficiency    Past Surgical History:  Procedure Laterality Date   CHOLECYSTECTOMY  2008   COLONOSCOPY  04/30/2011   Procedure: COLONOSCOPY;  Surgeon:  Rogene Houston, MD;  [facility];  Service: Endoscopy;  Laterality: N/A;   COLONOSCOPY  05/25/2012   Procedure: COLONOSCOPY;  Surgeon: Rogene Houston, MD;  [facility];  Service: Endoscopy;  Laterality: N/A;  1:25-changed to 1200 Ann to notify pt   COLONOSCOPY Bilateral 12/2017   COLONOSCOPY WITH PROPOFOL N/A 11/29/2020   Procedure: COLONOSCOPY WITH PROPOFOL;  Surgeon: Harvel Quale, MD;  [facility];  Service: Gastroenterology;  Laterality: N/A;  1:35 [facility] in the [facility]   ESOPHAGOGASTRODUODENOSCOPY N/A 12/07/2019   Procedure: ESOPHAGOGASTRODUODENOSCOPY (EGD);  Surgeon: Rogene Houston, MD;  [facility];  Service: Endoscopy;  Laterality: N/A;  155   GALLBLADDER SURGERY     right breast cystectomy  1988   TUBAL LIGATION  1975   Family History  Problem Relation Age of Onset   Diabetes Mother    Hyperlipidemia Mother    Hypertension Mother  Heart disease Father    Hyperlipidemia Father    Diabetes Sister    Hyperlipidemia Sister    Hypertension Sister    Cancer Brother    Heart disease Brother    Hyperlipidemia Brother    Hypertension Brother    Diabetes Sister    Social History   Socioeconomic History   Marital status: Divorced    Spouse name: Not on file   Number of children: 4   Years of education: 9   Highest education level: Not on file  Occupational History   Occupation: retired   Tobacco Use   Smoking status: Former    Packs/day: 1.00    Years: 40.00    Pack years: 40.00    Types: Cigarettes    Quit date: 04/03/2011    Years since quitting: 10.1   Smokeless tobacco: Never  Vaping Use   Vaping Use: Never used  Substance and Sexual Activity   Alcohol use: No   Drug use: No   Sexual activity: Not Currently  Other Topics Concern   Not on file  Social History Narrative   01/17/20 lives at [place], [place]   Social Determinants of Health   Financial Resource Strain: Not on file   Food Insecurity: Not on file  Transportation Needs: Not on file  Physical Activity: Not on file  Stress: Not on file  Social Connections: Not on file    Tobacco Counseling Counseling given: Not Answered   Clinical Intake:  Pre-visit preparation completed: Yes  Pain : No/denies pain     BMI - recorded: 17.47 Nutritional Status: BMI <19  Underweight Nutritional Risks: Unintentional weight loss Diabetes: Yes CBG done?: Yes (at facility) CBG resulted in Enter/ Edit results?: Yes (at faciltiy) Did pt. bring in CBG monitor from home?: No  How often do you need to have someone help you when you read instructions, pamphlets, or other written materials from your doctor or pharmacy?: 5 - Always  Diabetic?yes   Interpreter Needed?: No      Activities of Daily Living In your present state of health, do you have any difficulty performing the following activities: 05/12/2021 05/12/2021  Hearing? N N  Vision? N N  Difficulty concentrating or making decisions? N N  Walking or climbing stairs? Y Y  Dressing or bathing? Y Y  Doing errands, shopping? [place]pie Donning  Preparing Food and eating ? Y Y  Using the Toilet? - Y  In the past six months, have you accidently leaked urine? Y Y  Do you have problems with loss of bowel control? Y Y  Managing your Medications? Y Y  Managing your Finances? [place]pie Donning  Housekeeping or managing your Housekeeping? Y -  Some recent data might be hidden    Patient Care Team: Gerlene Fee, NP as PCP - General (Geriatric Medicine) Derek Jack, MD as Consulting Physician (Hematology) [facility] [place] ([place])  Indicate any recent Medical Services you may have received from other than [facility] providers in the past year (date may be approximate).     Assessment:   This is a routine wellness examination for [place].  Hearing/Vision screen No results found.  Dietary issues and exercise activities discussed: Current Exercise Habits:  The patient does not participate in regular exercise at present   Goals Addressed   None    Depression Screen PHQ 2/9 Scores 05/12/2021 05/10/2020 12/18/2019 10/30/2019 09/20/2019  PHQ - 2 Score 0 0 0 0 0  Exception Documentation - -  Medical reason - -    Fall Risk Fall Risk  05/12/2021 07/08/2020 05/10/2020 01/17/2020 12/18/2019  Falls in the past year? 0 0 1 1 1   Number falls in past yr: 0 - 0 1 1  Injury with Fall? 0 - 0 0 1  Risk for fall due to : - - History of fall(s);Impaired balance/gait;Impaired mobility - Impaired balance/gait;Impaired mobility  Follow up - - - - Falls evaluation completed    FALL RISK PREVENTION PERTAINING TO THE HOME:  Any stairs in or around the home? No  If so, are there any without handrails? No  Home free of loose throw rugs in walkways, pet beds, electrical cords, etc? Yes  Adequate lighting in your home to reduce risk of falls? Yes   ASSISTIVE DEVICES UTILIZED TO PREVENT FALLS:  Life alert? No  Use of a cane, walker or w/c? Yes  Grab bars in the bathroom? Yes  Shower chair or bench in shower? Yes  Elevated toilet seat or a handicapped toilet? Yes   TIMED UP AND GO:  Was the test performed? Yes .  Length of time to ambulate 10 feet: 30  sec.   Gait slow and steady with assistive device  Cognitive Function: MMSE - Mini Mental State Exam 05/12/2021  Orientation to time 5  Orientation to Place 5  Registration 3  Attention/ Calculation 4  Recall 3  Language- name 2 objects 2  Language- repeat 1  Language- follow 3 step command 3  Language- read & follow direction 1  Write a sentence 1  Copy design 1  Total score 29     6CIT Screen 05/12/2021 05/10/2020  What Year? 0 points 0 points  What month? 0 points 0 points  What time? 0 points 0 points  Count back from 20 0 points 0 points  Months in reverse 0 points 0 points  Repeat phrase 0 points 0 points  Total Score 0 0    Immunizations Immunization History  Administered Date(s)  Administered   Influenza, High Dose Seasonal PF 04/17/2019   Influenza-Unspecified 04/01/2012, 04/05/2018, 04/19/2020, 04/16/2021   Moderna Sars-Covid-2 Vaccination 05/16/2020   PFIZER(Purple Top)SARS-COV-2 Vaccination 09/25/2019, 10/16/2019, 10/23/2020   Pfizer Covid-19 Vaccine Bivalent Booster 23yrs & up 05/06/2021   Pneumococcal Conjugate-13 03/21/2014   Pneumococcal Polysaccharide-23 09/24/2017   Pneumococcal-Unspecified 12/02/2008   Zoster Recombinat (Shingrix) 03/11/2021, 05/06/2021   Zoster, Unspecified 03/11/2021    TDAP status: Up to date  Flu Vaccine status: Up to date  Pneumococcal vaccine status: Up to date  Covid-19 vaccine status: Completed vaccines  Qualifies for Shingles Vaccine? Yes   Zostavax completed Yes   Shingrix Completed?: Yes  Screening Tests Health Maintenance  Topic Date Due   OPHTHALMOLOGY EXAM  08/13/2021 (Originally 06/28/1956)   TETANUS/TDAP  01/14/2022 (Originally 06/29/1965)   FOOT EXAM  05/24/2021   Zoster Vaccines- Shingrix (2 of 2) 07/01/2021   HEMOGLOBIN A1C  10/30/2021   URINE MICROALBUMIN  11/07/2021   Pneumonia Vaccine 62+ Years old  Completed   INFLUENZA VACCINE  Completed   DEXA SCAN  Completed   COVID-19 Vaccine  Completed   Hepatitis C Screening  Completed   HPV VACCINES  Aged Out    Health Maintenance  There are no preventive care reminders to display for this patient.  Colorectal cancer screening: Type of screening: Cologuard. Completed  . Repeat every 5 years  Mammogram status: Ordered  . Pt provided with contact info and advised to call to schedule appt.   Bone  Density status: Completed 10-26-19. Results reflect: Bone density results: OSTEOPOROSIS. Repeat every 2 years.  Lung Cancer Screening: (Low Dose CT Chest recommended if Age 92-80 years, 30 pack-year currently smoking OR have quit w/in 15years.) does not qualify.   Lung Cancer Screening Referral: n/a  Additional Screening:  Hepatitis C Screening: does  qualify; Completed 01-17-20  Vision Screening: Recommended annual ophthalmology exams for early detection of glaucoma and other disorders of the eye. Is the patient up to date with their annual eye exam?  Yes  Who is the provider or what is the name of the office in which the patient attends annual eye exams?  If pt is not established with a provider, would they like to be referred to a provider to establish care?  N/a .   Dental Screening: Recommended annual dental exams for proper oral hygiene  Community Resource Referral / Chronic Care Management: CRR required this visit?  No   CCM required this visit?  No      Plan:     I have personally reviewed and noted the following in the patient's chart:   Medical and social history Use of alcohol, tobacco or illicit drugs  Current medications and supplements including opioid prescriptions.  Functional ability and status Nutritional status Physical activity Advanced directives List of other physicians Hospitalizations, surgeries, and ER visits in previous 12 months Vitals Screenings to include cognitive, depression, and falls Referrals and appointments  In addition, I have reviewed and discussed with patient certain preventive protocols, quality metrics, and best practice recommendations. A written personalized care plan for preventive services as well as general preventive health recommendations were provided to patient.     Gerlene Fee, NP   05/12/2021   Nurse Notes:

## 2021-05-12 NOTE — Progress Notes (Signed)
Provider: Jerene Dilling  Location [place]   PCP: Gerlene Fee, NP [email or web address] @  Extended Emergency Contact Information Primary Emergency Contact: [place] Phone: [phone] Relation: Son Secondary Emergency Contact: Edythe Lynn Address: [place], [place] [address] [place] Phone: [phone] Mobile Phone: [phone] Relation: Daughter  Codes status: DNR Goals of care: advanced directive information Advanced Directives 05/12/2021  Does Patient Have a Medical Advance Directive? Yes  Type of Paramedic of [place];Out of facility DNR (pink MOST or yellow form)  Does patient want to make changes to medical advance directive? No - Patient declined  Copy of [place] in Chart? Yes - validated most recent copy scanned in chart (See row information)  Would patient like information on creating a medical advance directive? -  Pre-existing out of facility DNR order (yellow form or pink MOST form) Yellow form placed in chart (order not valid for inpatient use)     Allergies  Allergen Reactions   Latex Rash   Levofloxacin Nausea And Vomiting   Neomycin-Bacitracin-Polymyxin  [Bacitracin-Neomycin-Polymyxin] Rash   Other Swelling and Rash    TOMATO  SOUP-PER MAR   Prednisone Hives, Itching, Rash and Other (See Comments)   Fish Allergy     rash   Shellfish Allergy Hives   Neosporin [Neomycin-Polymyxin-Gramicidin] Itching and Rash    Chief Complaint  Patient presents with   Annual Exam    AWV    HPI  Past Medical History:  Diagnosis Date   Allergy    Anemia    pernicious anemia   Arthritis    Asthma    Blood transfusion without reported diagnosis    CKD (chronic kidney disease)    COPD (chronic obstructive pulmonary disease) ([place]) 12/23/2019   Diabetic peripheral neuropathy associated with type 2 diabetes mellitus ([place]) 04/07/2021    Gastritis    GERD (gastroesophageal reflux disease)    Glaucoma    History of blood in urine    Hyperlipidemia    Hypertension    Neuropathy    both feet   Ocular hypertension    Osteoporosis    Stroke ([place])    Type 2 diabetes mellitus ([place]) 12/23/2019   Vitamin B12 deficiency anemia due to intrinsic factor deficiency    Past Surgical History:  Procedure Laterality Date   CHOLECYSTECTOMY  2008   COLONOSCOPY  04/30/2011   Procedure: COLONOSCOPY;  Surgeon: Rogene Houston, MD;  [facility];  Service: Endoscopy;  Laterality: N/A;   COLONOSCOPY  05/25/2012   Procedure: COLONOSCOPY;  Surgeon: Rogene Houston, MD;  [facility];  Service: Endoscopy;  Laterality: N/A;  1:25-changed to 1200 Ann to notify pt   COLONOSCOPY Bilateral 12/2017   COLONOSCOPY WITH PROPOFOL N/A 11/29/2020   Procedure: COLONOSCOPY WITH PROPOFOL;  Surgeon: Harvel Quale, MD;  [facility];  Service: Gastroenterology;  Laterality: N/A;  1:35 [facility]   ESOPHAGOGASTRODUODENOSCOPY N/A 12/07/2019   Procedure: ESOPHAGOGASTRODUODENOSCOPY (EGD);  Surgeon: Rogene Houston, MD;  [facility];  Service: Endoscopy;  Laterality: N/A;  155   GALLBLADDER SURGERY     right breast cystectomy  [place]    reports that she quit smoking about 10 years ago. Her smoking use included cigarettes. She has a 40.00 pack-year smoking history. She has never used smokeless tobacco. She  reports that she does not drink alcohol and does not use drugs. Social History   Tobacco Use   Smoking status: Former    Packs/day: 1.00    Years: 40.00    Pack years: 40.00    Types: Cigarettes    Quit date: 04/03/2011    Years since quitting: 10.1   Smokeless tobacco: Never  Vaping Use   Vaping Use: Never used  Substance Use Topics   Alcohol use: No   Drug use: No   Family History  Problem Relation Age of Onset   Diabetes Mother    Hyperlipidemia  Mother    Hypertension Mother    Heart disease Father    Hyperlipidemia Father    Diabetes Sister    Hyperlipidemia Sister    Hypertension Sister    Cancer Brother    Heart disease Brother    Hyperlipidemia Brother    Hypertension Brother    Diabetes Sister     Pertinent  Health Maintenance Due  Topic Date Due   OPHTHALMOLOGY EXAM  Never done   FOOT EXAM  05/24/2021   HEMOGLOBIN A1C  10/30/2021   URINE MICROALBUMIN  11/07/2021   INFLUENZA VACCINE  Completed   DEXA SCAN  Completed   Fall Risk 12/17/2020 12/31/2020 01/14/2021 02/11/2021 02/25/2021  Falls in the past year? - - - - -  Was there an injury with Fall? - - - - -  Fall Risk Category Calculator - - - - -  Fall Risk Category - - - - -  Patient Fall Risk Level High fall risk High fall risk High fall risk High fall risk High fall risk  Patient at Risk for Falls Due to - - - - -  Fall risk Follow up - - - - -   Depression screen [facility] 2/9 05/10/2020 12/18/2019 10/30/2019 09/20/2019  Decreased Interest 0 0 0 0  Down, Depressed, Hopeless 0 0 0 0  PHQ - 2 Score 0 0 0 0  Some recent data might be hidden    Functional Status Survey:    Outpatient Encounter Medications as of 05/12/2021  Medication Sig   acetaminophen (TYLENOL) 325 MG tablet Take 2 tablets (650 mg total) by mouth every 6 (six) hours as needed for mild pain (or Fever >/= 101).   albuterol (VENTOLIN HFA) 108 (90 Base) MCG/ACT inhaler Inhale 2 puffs into the lungs every 4 (four) hours as needed for wheezing or shortness of breath.   amLODipine (NORVASC) 10 MG tablet Take 1 tablet (10 mg total) by mouth daily.   aspirin EC 81 MG tablet Take 81 mg by mouth daily. Swallow whole.   Balsam [place]-Castor Oil (VENELEX) OINT Apply 1 application topically as needed. Apply to sacrum and bilateral buttocks Every Shift - PRN; PRN 1, PRN 2, PRN 3   cyanocobalamin (,VITAMIN B-12,) 1000 MCG/ML injection Inject 1 mL (1,000 mcg total) into the muscle every 30 (thirty) days.   docusate  sodium (COLACE) 100 MG capsule Take 1 capsule (100 mg total) by mouth 2 (two) times daily.   dorzolamide (TRUSOPT) 2 % ophthalmic solution Place 1 drop into both eyes 2 (two) times daily. (0900 & 1700)   ergocalciferol (VITAMIN D2) 1.25 MG (50000 UT) capsule Take 50,000 Units by mouth once a week. Thursday   folic acid (FOLVITE) 1 MG tablet Take 1 mg by mouth daily. (0900)   gabapentin (NEURONTIN) 100 MG capsule Take 100 mg by mouth at bedtime. (2100)   Iron, Ferrous Sulfate, 325 (65  Fe) MG TABS Take by mouth. amt: 1 tab; oral,Once A Day   latanoprost (XALATAN) 0.005 % ophthalmic solution Place 1 drop into both eyes at bedtime. (2100)   loperamide (IMODIUM A-D) 2 MG tablet Take 2 mg by mouth every 4 (four) hours as needed for diarrhea or loose stools.   metoCLOPramide (REGLAN) 5 MG tablet Take 1 tablet (5 mg total) by mouth 3 (three) times daily before meals.   NON FORMULARY Regular thin liquids  ALLERGIC TO FISH   omeprazole (PRILOSEC) 40 MG capsule Take 40 mg by mouth daily at 6 (six) AM. (0600)   ondansetron (ZOFRAN) 4 MG tablet Take 4 mg by mouth every 6 (six) hours as needed for nausea or vomiting.   Wound Dressings (ALLEVYN LIFE HEEL) PADS Apply 1 patch topically as directed. Every 5 days and as needed for prevention   No facility-administered encounter medications on file as of 05/12/2021.     Vitals:   05/12/21 0954  BP: 139/62  Pulse: 69  Resp: 20  Temp: (!) 96.8 F (36 C)  SpO2: 93%  Weight: 101 lb 12.8 oz (46.2 kg)  Height: 5\' 4"  (1.626 m)   Body mass index is 17.47 kg/m.  DIAGNOSTIC EXAMS   ROS  PHYS  ASSESSMENT/PLAN   Family/staff communication:   ORDERS:

## 2021-05-12 NOTE — Patient Instructions (Signed)
  Traci Parker , Thank you for taking time to come for your Medicare Wellness Visit. I appreciate your ongoing commitment to your health goals. Please review the following plan we discussed and let me know if I can assist you in the future.   These are the goals we discussed:  Goals      Absence of Fall and Fall-Related Injury     Evidence-based guidance:  Assess fall risk using a validated tool when available. Consider balance and gait impairment, muscle weakness, diminished vision or hearing, environmental hazards, presence of urinary or bowel urgency and/or incontinence.  Communicate fall injury risk to interprofessional healthcare team.  Develop a fall prevention plan with the patient and family.  Promote use of personal vision and auditory aids.  Promote reorientation, appropriate sensory stimulation, and routines to decrease risk of fall when changes in mental status are present.  Assess assistance level required for safe and effective self-care; consider referral for home care.  Encourage physical activity, such as performance of self-care at highest level of ability, strength and balance exercise program, and provision of appropriate assistive devices; refer to rehabilitation therapy.  Refer to community-based fall prevention program where available.  If fall occurs, determine the cause and revise fall injury prevention plan.  Regularly review medication contribution to fall risk; consider risk related to polypharmacy and age.  Refer to pharmacist for consultation when concerns about medications are revealed.  Balance adequate pain management with potential for oversedation.  Provide guidance related to environmental modifications.  Consider supplementation with Vitamin D.   Notes:      Follow up with Primary Care Provider     General - Client will not be readmitted within 30 days ([facility])        This is a list of the screening recommended for you and due dates:  Health Maintenance   Topic Date Due   Eye exam for diabetics  08/13/2021*   Tetanus Vaccine  01/14/2022*   Complete foot exam   05/24/2021   Zoster (Shingles) Vaccine (2 of 2) 07/01/2021   Hemoglobin A1C  10/30/2021   Urine Protein Check  11/07/2021   Pneumonia Vaccine  Completed   Flu Shot  Completed   DEXA scan (bone density measurement)  Completed   COVID-19 Vaccine  Completed   Hepatitis C Screening: USPSTF Recommendation to screen - Ages 87-79 yo.  Completed   HPV Vaccine  Aged Out  *Topic was postponed. The date shown is not the original due date.

## 2021-05-19 NOTE — Progress Notes (Signed)
Traci Parker, [place] [address]   CLINIC:  Medical Oncology/Hematology  PCP:  Gerlene Fee, NP [place] [address] [phone]   REASON FOR VISIT:  Follow-up for anemia due to CKD   CURRENT THERAPY: Retacrit injections 10,000 units every 4 weeks for Hgb < 11.0  INTERVAL HISTORY:  Traci Parker 75 y.o. female returns for routine follow-up of her anemia due to CKD and iron deficiency.  She was last seen by Tarri Abernethy PA-C on 02/25/2021.  At today's visit, she reports feeling well.  No recent hospitalizations, surgeries, or changes in baseline health status.  She has not required any Retacrit injection for the past 3 months.  Last dose was given on 02/11/2021. She denies any gross rectal hemorrhage, melena, epistaxis, or other sources of bleeding. She denies any signs or symptoms of recurrent DVT/PE such as unilateral leg swelling, pain, erythema or shortness of breath, dyspnea on exertion, chest pain, cough, hemoptysis, or palpitations. No B symptoms of fever, chills, unintentional weight loss, night sweats.  She continues to take daily folic acid supplement, monthly B12 injections, and daily iron supplement. She reports that the constipation from her iron supplements has improved with stool softener, which she is taking twice daily.  She has 90% energy and 80% appetite. She endorses that she is maintaining a stable weight.    REVIEW OF SYSTEMS:  Review of Systems  Constitutional:  Negative for appetite change, chills, diaphoresis, fever and unexpected weight change.  HENT:   Negative for lump/mass and nosebleeds.   Eyes:  Negative for eye problems.  Respiratory:  Negative for cough, hemoptysis and shortness of breath.   Cardiovascular:  Negative for chest pain, leg swelling and palpitations.  Gastrointestinal:  Negative for abdominal pain, blood in stool, constipation, diarrhea, nausea and vomiting.  Genitourinary:   Negative for hematuria.   Skin: Negative.   Neurological:  Positive for numbness (chronic neuropathy). Negative for dizziness, headaches and light-headedness.  Hematological:  Does not bruise/bleed easily.     PAST MEDICAL/SURGICAL HISTORY:  Past Medical History:  Diagnosis Date   Allergy    Anemia    pernicious anemia   Arthritis    Asthma    Blood transfusion without reported diagnosis    CKD (chronic kidney disease)    COPD (chronic obstructive pulmonary disease) ([place]) 12/23/2019   Diabetic peripheral neuropathy associated with type 2 diabetes mellitus ([place]) 04/07/2021   Gastritis    GERD (gastroesophageal reflux disease)    Glaucoma    History of blood in urine    Hyperlipidemia    Hypertension    Neuropathy    both feet   Ocular hypertension    Osteoporosis    Stroke ([place])    Type 2 diabetes mellitus ([place]) 12/23/2019   Vitamin B12 deficiency anemia due to intrinsic factor deficiency    Past Surgical History:  Procedure Laterality Date   CHOLECYSTECTOMY  2008   COLONOSCOPY  04/30/2011   Procedure: COLONOSCOPY;  Surgeon: Rogene Houston, MD;  [facility];  Service: Endoscopy;  Laterality: N/A;   COLONOSCOPY  05/25/2012   Procedure: COLONOSCOPY;  Surgeon: Rogene Houston, MD;  [facility];  Service: Endoscopy;  Laterality: N/A;  1:25-changed to 1200 Ann to notify pt   COLONOSCOPY Bilateral 12/2017   COLONOSCOPY WITH PROPOFOL N/A 11/29/2020   Procedure: COLONOSCOPY WITH PROPOFOL;  Surgeon: Harvel Quale, MD;  [facility]: [facility];  Service: Gastroenterology;  Laterality: N/A;  1:35 patient is in the [facility]   ESOPHAGOGASTRODUODENOSCOPY N/A 12/07/2019   Procedure: ESOPHAGOGASTRODUODENOSCOPY (EGD);  Surgeon: Rogene Houston, MD;  [facility];  Service: Endoscopy;  Laterality: N/A;  155   GALLBLADDER SURGERY     right breast cystectomy  1988   TUBAL LIGATION  1975     SOCIAL HISTORY:  Social History    Socioeconomic History   Marital status: Divorced    Spouse name: Not on file   Number of children: 4   Years of education: 9   Highest education level: Not on file  Occupational History   Occupation: retired   Tobacco Use   Smoking status: Former    Packs/day: 1.00    Years: 40.00    Pack years: 40.00    Types: Cigarettes    Quit date: 04/03/2011    Years since quitting: 10.1   Smokeless tobacco: Never  Vaping Use   Vaping Use: Never used  Substance and Sexual Activity   Alcohol use: No   Drug use: No   Sexual activity: Not Currently  Other Topics Concern   Not on file  Social History Narrative   01/17/20 lives at [facility], [place]   Social Determinants of Health   Financial Resource Strain: Not on file  Food Insecurity: Not on file  Transportation Needs: Not on file  Physical Activity: Not on file  Stress: Not on file  Social Connections: Not on file  Intimate Partner Violence: Not on file    FAMILY HISTORY:  Family History  Problem Relation Age of Onset   Diabetes Mother    Hyperlipidemia Mother    Hypertension Mother    Heart disease Father    Hyperlipidemia Father    Diabetes Sister    Hyperlipidemia Sister    Hypertension Sister    Cancer Brother    Heart disease Brother    Hyperlipidemia Brother    Hypertension Brother    Diabetes Sister     CURRENT MEDICATIONS:  Outpatient Encounter Medications as of 05/20/2021  Medication Sig   acetaminophen (TYLENOL) 325 MG tablet Take 2 tablets (650 mg total) by mouth every 6 (six) hours as needed for mild pain (or Fever >/= 101).   albuterol (VENTOLIN HFA) 108 (90 Base) MCG/ACT inhaler Inhale 2 puffs into the lungs every 4 (four) hours as needed for wheezing or shortness of breath.   amLODipine (NORVASC) 10 MG tablet Take 1 tablet (10 mg total) by mouth daily.   aspirin EC 81 MG tablet Take 81 mg by mouth daily. Swallow whole.   Balsam [place]-Castor Oil (VENELEX) OINT Apply 1 application topically as  needed. Apply to sacrum and bilateral buttocks Every Shift - PRN; PRN 1, PRN 2, PRN 3   cyanocobalamin (,VITAMIN B-12,) 1000 MCG/ML injection Inject 1 mL (1,000 mcg total) into the muscle every 30 (thirty) days.   docusate sodium (COLACE) 100 MG capsule Take 1 capsule (100 mg total) by mouth 2 (two) times daily.   dorzolamide (TRUSOPT) 2 % ophthalmic solution Place 1 drop into both eyes 2 (two) times daily. (0900 & 1700)   ergocalciferol (VITAMIN D2) 1.25 MG (50000 UT) capsule Take 50,000 Units by mouth once a week. Thursday   folic acid (FOLVITE) 1 MG tablet Take 1 mg by mouth daily. (0900)   gabapentin (NEURONTIN) 100 MG capsule Take 100 mg by mouth at bedtime. (2100)   Iron, Ferrous Sulfate, 325 (65 Fe) MG TABS Take by mouth. amt:  1 tab; oral,Once A Day   latanoprost (XALATAN) 0.005 % ophthalmic solution Place 1 drop into both eyes at bedtime. (2100)   loperamide (IMODIUM A-D) 2 MG tablet Take 2 mg by mouth every 4 (four) hours as needed for diarrhea or loose stools.   metoCLOPramide (REGLAN) 5 MG tablet Take 1 tablet (5 mg total) by mouth 3 (three) times daily before meals.   NON FORMULARY Regular thin liquids  ALLERGIC TO FISH   omeprazole (PRILOSEC) 40 MG capsule Take 40 mg by mouth daily at 6 (six) AM. (0600)   ondansetron (ZOFRAN) 4 MG tablet Take 4 mg by mouth every 6 (six) hours as needed for nausea or vomiting.   Wound Dressings (ALLEVYN LIFE HEEL) PADS Apply 1 patch topically as directed. Every 5 days and as needed for prevention   No facility-administered encounter medications on file as of 05/20/2021.    ALLERGIES:  Allergies  Allergen Reactions   Latex Rash   Levofloxacin Nausea And Vomiting   Neomycin-Bacitracin-Polymyxin  [Bacitracin-Neomycin-Polymyxin] Rash   Other Swelling and Rash    TOMATO  SOUP-PER MAR   Prednisone Hives, Itching, Rash and Other (See Comments)   Fish Allergy     rash   Shellfish Allergy Hives   Neosporin [Neomycin-Polymyxin-Gramicidin] Itching and  Rash     PHYSICAL EXAM:  ECOG PERFORMANCE STATUS: 2 - Symptomatic, <50% confined to bed  There were no vitals filed for this visit. There were no vitals filed for this visit. Physical Exam Constitutional:      Appearance: Normal appearance.     Comments: Presents in wheelchair  HENT:     Head: Normocephalic and atraumatic.     Mouth/Throat:     Mouth: Mucous membranes are moist.  Eyes:     Extraocular Movements: Extraocular movements intact.     Pupils: Pupils are equal, round, and reactive to light.  Cardiovascular:     Rate and Rhythm: Normal rate and regular rhythm.     Pulses: Normal pulses.     Heart sounds: Normal heart sounds.  Pulmonary:     Effort: Pulmonary effort is normal.     Breath sounds: Normal breath sounds.  Abdominal:     General: Bowel sounds are normal.     Palpations: Abdomen is soft.     Tenderness: There is no abdominal tenderness.  Musculoskeletal:        General: No swelling.     Right lower leg: No edema.     Left lower leg: No edema.  Lymphadenopathy:     Cervical: No cervical adenopathy.  Skin:    General: Skin is warm and dry.  Neurological:     General: No focal deficit present.     Mental Status: She is alert and oriented to person, place, and time.  Psychiatric:        Mood and Affect: Mood normal.        Behavior: Behavior normal.     LABORATORY DATA:  I have reviewed the labs as listed.  CBC    Component Value Date/Time   WBC 7.5 04/22/2021 1226   RBC 3.91 04/22/2021 1226   HGB 11.3 (L) 04/22/2021 1226   HCT 36.5 04/22/2021 1226   PLT 165 04/22/2021 1226   MCV 93.4 04/22/2021 1226   MCH 28.9 04/22/2021 1226   MCHC 31.0 04/22/2021 1226   RDW 14.5 04/22/2021 1226   LYMPHSABS 2.3 04/22/2021 1226   MONOABS 0.5 04/22/2021 1226   EOSABS 0.6 (H) 04/22/2021 1226  BASOSABS 0.1 04/22/2021 1226   CMP Latest Ref Rng & Units 05/01/2021 02/07/2021 11/26/2020  Glucose 70 - 99 mg/dL 91 117(H) 118(H)  BUN 8 - 23 mg/dL 25(H) 24(H)  21  Creatinine 0.44 - 1.00 mg/dL 1.17(H) 1.16(H) 1.14(H)  Sodium 135 - 145 mmol/L 135 134(L) 138  Potassium 3.5 - 5.1 mmol/L 4.2 4.2 4.2  Chloride 98 - 111 mmol/L 102 100 102  CO2 22 - 32 mmol/L 27 26 27   Calcium 8.9 - 10.3 mg/dL 8.9 9.4 9.4  Total Protein 6.5 - 8.1 g/dL 6.9 - -  Total Bilirubin 0.3 - 1.2 mg/dL 0.5 - -  Alkaline Phos 38 - 126 U/L 78 - -  AST 15 - 41 U/L 10(L) - -  ALT 0 - 44 U/L 8 - -    DIAGNOSTIC IMAGING:  I have independently reviewed the relevant imaging and discussed with the patient.  ASSESSMENT & PLAN: 1.  Normocytic anemia, secondary to CKD and relative iron deficiency - Etiology is from combination of CKD and relative iron deficiency. - Initial work-up included SPEP which was negative.  Stool was negative for occult blood.  Hemoglobin electrophoresis showed severe microcytosis related to her anemia.  She could have underlying thalassemia/sickle cell trait. - Last colonoscopy was in June 2019 in [place], 3 polyps removed. - She has a history of needing a blood transfusion in the early 2000's. - She denies any bright red blood per rectum or melena  - We have taken over Epogen injections for Dr. Theador Hawthorne.   - Her current treatment plan is Retacrit 10,000 units every 4 weeks, but she has not required Retacrit for the past 3 months due to Hgb > 11.0 (last Retacrit was 02/11/2021) -- She was started on oral ferrous sulfate at last visit, but reported significant constipation, which was relieved by Colace  - Most recent iron panel (05/20/2021): Ferritin 284, iron saturation 26% - Most recent CBC (05/20/2021): Hgb 12.2, mildly elevated eosinophils 0.8, otherwise normal CBC. - PLAN:  We will discontinue Retacrit treatment plan for the time being.  No indication for IV iron, but patient should continue oral iron supplementation with Colace twice daily. -  RTC in 3 months for repeat labs and office visit.    2.  CKD stage III -Ultrasound the kidneys on 08/22/2019 showed  bilateral renal atrophy, severe atrophy of the right kidney. - PLAN: Continue follow-up with Dr. Theador Hawthorne  3.  R41 and folic acid deficiency: - She is on B12 injections monthly at SNF - She takes daily folic acid supplement - Labs today (05/20/2021): B12 normal at 777 - PLAN: Continue monthly B12 injections and daily folic acid supplement at SNF   4.  History of lower extremity DVT - Venous duplex (02/02/2020): Extensive acute appearing left femoropopliteal occlusive DVT extending into the calf veins. - Considered provoked in the setting of immobility/bedbound functional status - Eliquis was discontinued by her PCP, placed on aspirin and statin   PLAN SUMMARY & DISPOSITION: Labs and RTC in 3 months  All questions were answered. The patient knows to call the clinic with any problems, questions or concerns.  Medical decision making: Moderate  Time spent on visit: I spent 20 minutes counseling the patient face to face. The total time spent in the appointment was 30 minutes and more than 50% was on counseling.   Harriett Rush, PA-C  05/20/2021 9:58 AM

## 2021-05-20 ENCOUNTER — Other Ambulatory Visit: Payer: Self-pay

## 2021-05-20 ENCOUNTER — Inpatient Hospital Stay (HOSPITAL_COMMUNITY): Payer: Medicare Other | Attending: Hematology

## 2021-05-20 ENCOUNTER — Inpatient Hospital Stay (HOSPITAL_BASED_OUTPATIENT_CLINIC_OR_DEPARTMENT_OTHER): Payer: Medicare Other | Admitting: Physician Assistant

## 2021-05-20 ENCOUNTER — Inpatient Hospital Stay (HOSPITAL_COMMUNITY): Payer: Medicare Other

## 2021-05-20 VITALS — BP 146/71 | HR 84 | Temp 98.6°F | Resp 18

## 2021-05-20 DIAGNOSIS — Z87891 Personal history of nicotine dependence: Secondary | ICD-10-CM | POA: Insufficient documentation

## 2021-05-20 DIAGNOSIS — E538 Deficiency of other specified B group vitamins: Secondary | ICD-10-CM

## 2021-05-20 DIAGNOSIS — N183 Chronic kidney disease, stage 3 unspecified: Secondary | ICD-10-CM

## 2021-05-20 DIAGNOSIS — Z7901 Long term (current) use of anticoagulants: Secondary | ICD-10-CM | POA: Insufficient documentation

## 2021-05-20 DIAGNOSIS — Z7982 Long term (current) use of aspirin: Secondary | ICD-10-CM | POA: Diagnosis not present

## 2021-05-20 DIAGNOSIS — Z8673 Personal history of transient ischemic attack (TIA), and cerebral infarction without residual deficits: Secondary | ICD-10-CM | POA: Diagnosis not present

## 2021-05-20 DIAGNOSIS — D631 Anemia in chronic kidney disease: Secondary | ICD-10-CM

## 2021-05-20 DIAGNOSIS — D509 Iron deficiency anemia, unspecified: Secondary | ICD-10-CM | POA: Insufficient documentation

## 2021-05-20 DIAGNOSIS — Z79899 Other long term (current) drug therapy: Secondary | ICD-10-CM | POA: Insufficient documentation

## 2021-05-20 DIAGNOSIS — E1122 Type 2 diabetes mellitus with diabetic chronic kidney disease: Secondary | ICD-10-CM | POA: Insufficient documentation

## 2021-05-20 DIAGNOSIS — I129 Hypertensive chronic kidney disease with stage 1 through stage 4 chronic kidney disease, or unspecified chronic kidney disease: Secondary | ICD-10-CM | POA: Insufficient documentation

## 2021-05-20 DIAGNOSIS — Z86718 Personal history of other venous thrombosis and embolism: Secondary | ICD-10-CM | POA: Diagnosis not present

## 2021-05-20 LAB — CBC WITH DIFFERENTIAL/PLATELET
Abs Immature Granulocytes: 0.02 10*3/uL (ref 0.00–0.07)
Basophils Absolute: 0.1 10*3/uL (ref 0.0–0.1)
Basophils Relative: 1 %
Eosinophils Absolute: 0.8 10*3/uL — ABNORMAL HIGH (ref 0.0–0.5)
Eosinophils Relative: 9 %
HCT: 40.3 % (ref 36.0–46.0)
Hemoglobin: 12.2 g/dL (ref 12.0–15.0)
Immature Granulocytes: 0 %
Lymphocytes Relative: 31 %
Lymphs Abs: 2.6 10*3/uL (ref 0.7–4.0)
MCH: 28.8 pg (ref 26.0–34.0)
MCHC: 30.3 g/dL (ref 30.0–36.0)
MCV: 95.3 fL (ref 80.0–100.0)
Monocytes Absolute: 0.4 10*3/uL (ref 0.1–1.0)
Monocytes Relative: 5 %
Neutro Abs: 4.5 10*3/uL (ref 1.7–7.7)
Neutrophils Relative %: 54 %
Platelets: 176 10*3/uL (ref 150–400)
RBC: 4.23 MIL/uL (ref 3.87–5.11)
RDW: 14.6 % (ref 11.5–15.5)
WBC: 8.4 10*3/uL (ref 4.0–10.5)
nRBC: 0 % (ref 0.0–0.2)

## 2021-05-20 LAB — FOLATE: Folate: >100 ng/mL (ref >5.9)

## 2021-05-20 LAB — IRON AND TIBC
Iron: 56 ug/dL (ref 28–170)
Saturation Ratios: 26 % (ref 10.4–31.8)
TIBC: 215 ug/dL — ABNORMAL LOW (ref 250–450)
UIBC: 159 ug/dL

## 2021-05-20 LAB — VITAMIN B12: Vitamin B-12: 777 pg/mL (ref 180–914)

## 2021-05-20 LAB — FERRITIN: Ferritin: 284 ng/mL (ref 11–307)

## 2021-05-20 NOTE — Progress Notes (Signed)
Hemoglobin 12.3. No injection needed today.

## 2021-05-20 NOTE — Patient Instructions (Signed)
[facility] Discharge Instructions  You were seen today by Tarri Abernethy PA-C for your history of anemia.  Your blood counts looks great today!  Because your blood has been doing so well and you have not needed the Retacrit shots for the past 3 months, we will discontinue Retacrit injections for the time being.  Instead, we will check your blood and see you back for another visit in 3 months.  LABS: Return in 3 months for repeat labs  MEDICATIONS: - Continue monthly B12 injections - Continue folic acid and iron supplements  FOLLOW-UP APPOINTMENT: Office visit in 3 months   Thank you for choosing [facility] to provide your oncology and hematology care.  To afford each patient quality time with our provider, please arrive at least 15 minutes before your scheduled appointment time.   If you have a lab appointment with the [facility] please come in thru the Main Entrance and check in at the main information desk.  You need to re-schedule your appointment should you arrive 10 or more minutes late.  We strive to give you quality time with our providers, and arriving late affects you and other patients whose appointments are after yours.  Also, if you no show three or more times for appointments you may be dismissed from the clinic at the providers discretion.     Again, thank you for choosing [facility].  Our hope is that these requests will decrease the amount of time that you wait before being seen by our physicians.       _____________________________________________________________  Should you have questions after your visit to [facility], please contact our office at [phone] and follow the prompts.  Our office hours are 8:00 a.m. and 4:30 p.m. Monday - Friday.  Please note that voicemails left after 4:00 p.m. may not be returned until the following business day.  We are closed  weekends and major holidays.  You do have access to a nurse 24-7, just call the main number to the clinic [phone] and do not press any options, hold on the line and a nurse will answer the phone.    For prescription refill requests, have your pharmacy contact our office and allow 72 hours.    Due to Covid, you will need to wear a mask upon entering the hospital. If you do not have a mask, a mask will be given to you at the Main Entrance upon arrival. For doctor visits, patients may have 1 support person age 27 or older with them. For treatment visits, patients can not have anyone with them due to social distancing guidelines and our immunocompromised population.

## 2021-05-22 ENCOUNTER — Ambulatory Visit (HOSPITAL_COMMUNITY): Payer: Medicare Other

## 2021-05-23 LAB — METHYLMALONIC ACID, SERUM: Methylmalonic Acid, Quantitative: 188 nmol/L (ref 0–378)

## 2021-05-30 ENCOUNTER — Non-Acute Institutional Stay (SKILLED_NURSING_FACILITY): Payer: Medicare Other | Admitting: Internal Medicine

## 2021-05-30 ENCOUNTER — Encounter: Payer: Self-pay | Admitting: Internal Medicine

## 2021-05-30 DIAGNOSIS — E1122 Type 2 diabetes mellitus with diabetic chronic kidney disease: Secondary | ICD-10-CM

## 2021-05-30 DIAGNOSIS — E538 Deficiency of other specified B group vitamins: Secondary | ICD-10-CM

## 2021-05-30 DIAGNOSIS — N183 Chronic kidney disease, stage 3 unspecified: Secondary | ICD-10-CM

## 2021-05-30 DIAGNOSIS — E785 Hyperlipidemia, unspecified: Secondary | ICD-10-CM

## 2021-05-30 NOTE — Assessment & Plan Note (Addendum)
A1c is prediabetic at 5.3%.  She has CKD stage IIIa with a creatinine of 1.17 and GFR 48. She is on low dose Gabapentin.  Follow-up with Nephrology is scheduled 06/11/2021.

## 2021-05-30 NOTE — Assessment & Plan Note (Addendum)
Lipitor has been stopped because of muscle discomfort and profound weakness.  With history of stroke , LDL goal is less than 70.  Most current LDL is 139.  Low-dose Crestor will be initiated 3 times a week with lipid monitor.

## 2021-05-30 NOTE — Progress Notes (Signed)
NURSING HOME LOCATION: [facility] ROOM NUMBER:  [id number]  CODE STATUS:  DNR  PCP:   Ok Edwards NP  This is a nursing facility follow up visit of chronic medical diagnoses & to document compliance with Regulation 483.30 (c) in The [place] Manual Phase 2 which mandates caregiver visit ( visits can alternate among physician, PA or NP as per statutes) within 10 days of 30 days / 60 days/ 90 days post admission to SNF date    Interim medical record and care since last SNF visit was updated with review of diagnostic studies and change in clinical status since last visit were documented.  HPI: She is a permanent resident of this facility with a history of asthma, chronic anemia, COPD, diabetes with CKD and peripheral neuropathy, GERD, glaucoma, essential hypertension, osteoporosis, history of stroke, history of B12 deficiency, and dyslipidemia.  Labs were reviewed; she has CKD stage IIIa with a creatinine of 1.17 and GFR 48.  She is on B12 injections and both methylmalonic acid and B12 levels are normal.  A1c is prediabetic at 5.3%.  LDL was 139; she is not on a statin.  She was previously on Lipitor but was having muscular discomfort and weakness.  Review of systems: She is incredibly sharp.  She told me what her present hemoglobin is & when she was supposed to return to see the Hematologist.  She does describe cold intolerance.  Her TSH was therapeutic in February of this year.  The cold intolerance is longstanding.  She also has numbness and tingling in her hands and feet.  She denies any other active symptoms.  Constitutional: No fever, significant weight change, fatigue  Eyes: No redness, discharge, pain, vision change ENT/mouth: No nasal congestion,  purulent discharge, earache, change in hearing, sore throat  Cardiovascular: No chest pain, palpitations, paroxysmal nocturnal dyspnea, claudication, edema  Respiratory: No cough, sputum production, hemoptysis,  DOE, significant snoring, apnea   Gastrointestinal: No heartburn, dysphagia, abdominal pain, nausea /vomiting, rectal bleeding, melena, change in bowels Genitourinary: No dysuria, hematuria, pyuria, incontinence, nocturia Musculoskeletal: No joint stiffness, joint swelling, weakness, pain Dermatologic: No rash, pruritus, change in appearance of skin Neurologic: No dizziness, headache, syncope, seizures Psychiatric: No significant anxiety, depression, insomnia, anorexia Endocrine: No change in hair/skin/nails, excessive thirst, excessive hunger, excessive urination  Hematologic/lymphatic: No significant bruising, lymphadenopathy, abnormal bleeding Allergy/immunology: No itchy/watery eyes, significant sneezing, urticaria, angioedema  Physical exam:  Pertinent or positive findings: She is thin.  Hands are in furry mittens.  There is slight exotropia of the right eye.  The right lacrimal gland is prominent.  She is edentulous.  Second heart sound is accentuated.  Pedal pulses are decreased.  Limbs are atrophic.  The lower extremities are stronger than the upper extremities.  General appearance:  no acute distress, increased work of breathing is present.   Lymphatic: No lymphadenopathy about the head, neck, axilla. Eyes: No conjunctival inflammation or lid edema is present. There is no scleral icterus. Ears:  External ear exam shows no significant lesions or deformities.   Nose:  External nasal examination shows no deformity or inflammation. Nasal mucosa are pink and moist without lesions, exudates Oral exam:  Lips and gums are healthy appearing. There is no oropharyngeal erythema or exudate. Neck:  No thyromegaly, masses, tenderness noted.    Heart:  Normal rate and regular rhythm. S1 normal without gallop, murmur, click, rub .  Lungs: Chest clear to auscultation without wheezes, rhonchi, rales, rubs. Abdomen: Bowel  sounds are normal. Abdomen is soft and nontender with no organomegaly, hernias,  masses. GU: Deferred  Extremities:  No cyanosis, clubbing, edema  Neurologic exam :Balance, Rhomberg, finger to nose testing could not be completed due to clinical state Skin: Warm & dry w/o tenting. No significant lesions or rash.  See summary under each active problem in the Problem List with associated updated therapeutic plan

## 2021-05-30 NOTE — Assessment & Plan Note (Signed)
Methylmalonic acid and B12 levels are normal.  Anemia has resolved.

## 2021-05-30 NOTE — Patient Instructions (Signed)
See assessment and plan under each diagnosis in the problem list and acutely for this visit 

## 2021-06-10 ENCOUNTER — Other Ambulatory Visit (HOSPITAL_COMMUNITY)
Admission: RE | Admit: 2021-06-10 | Discharge: 2021-06-10 | Disposition: A | Discharge status: Home / Self Care | Payer: Medicare Other | Source: Skilled Nursing Facility | Attending: Adult Health | Admitting: Adult Health

## 2021-06-10 DIAGNOSIS — D509 Iron deficiency anemia, unspecified: Secondary | ICD-10-CM | POA: Insufficient documentation

## 2021-06-10 LAB — CBC WITH DIFFERENTIAL/PLATELET
Abs Immature Granulocytes: 0.02 10*3/uL (ref 0.00–0.07)
Basophils Absolute: 0 10*3/uL (ref 0.0–0.1)
Basophils Relative: 0 %
Eosinophils Absolute: 0.6 10*3/uL — ABNORMAL HIGH (ref 0.0–0.5)
Eosinophils Relative: 8 %
HCT: 33.8 % — ABNORMAL LOW (ref 36.0–46.0)
Hemoglobin: 10.5 g/dL — ABNORMAL LOW (ref 12.0–15.0)
Immature Granulocytes: 0 %
Lymphocytes Relative: 32 %
Lymphs Abs: 2.5 10*3/uL (ref 0.7–4.0)
MCH: 29 pg (ref 26.0–34.0)
MCHC: 31.1 g/dL (ref 30.0–36.0)
MCV: 93.4 fL (ref 80.0–100.0)
Monocytes Absolute: 0.6 10*3/uL (ref 0.1–1.0)
Monocytes Relative: 8 %
Neutro Abs: 4.1 10*3/uL (ref 1.7–7.7)
Neutrophils Relative %: 52 %
Platelets: 207 10*3/uL (ref 150–400)
RBC: 3.62 MIL/uL — ABNORMAL LOW (ref 3.87–5.11)
RDW: 14.4 % (ref 11.5–15.5)
WBC: 7.8 10*3/uL (ref 4.0–10.5)
nRBC: 0 % (ref 0.0–0.2)

## 2021-06-10 LAB — PROTEIN / CREATININE RATIO, URINE
Creatinine, Urine: 92.81 mg/dL
Protein Creatinine Ratio: 0.31 mg/mg{Cre} — ABNORMAL HIGH (ref 0.00–0.15)
Total Protein, Urine: 29 mg/dL

## 2021-06-10 LAB — RENAL FUNCTION PANEL
Albumin: 3.9 g/dL (ref 3.5–5.0)
Anion gap: 7 (ref 5–15)
BUN: 27 mg/dL — ABNORMAL HIGH (ref 8–23)
CO2: 27 mmol/L (ref 22–32)
Calcium: 9.2 mg/dL (ref 8.9–10.3)
Chloride: 103 mmol/L (ref 98–111)
Creatinine, Ser: 1.36 mg/dL — ABNORMAL HIGH (ref 0.44–1.00)
GFR, Estimated: 40 mL/min — ABNORMAL LOW (ref >60)
Glucose, Bld: 113 mg/dL — ABNORMAL HIGH (ref 70–99)
Phosphorus: 3.9 mg/dL (ref 2.5–4.6)
Potassium: 4.3 mmol/L (ref 3.5–5.1)
Sodium: 137 mmol/L (ref 135–145)

## 2021-06-11 DIAGNOSIS — I129 Hypertensive chronic kidney disease with stage 1 through stage 4 chronic kidney disease, or unspecified chronic kidney disease: Secondary | ICD-10-CM | POA: Diagnosis not present

## 2021-06-11 DIAGNOSIS — D638 Anemia in other chronic diseases classified elsewhere: Secondary | ICD-10-CM | POA: Diagnosis not present

## 2021-06-11 DIAGNOSIS — E1122 Type 2 diabetes mellitus with diabetic chronic kidney disease: Secondary | ICD-10-CM | POA: Diagnosis not present

## 2021-06-11 DIAGNOSIS — E1129 Type 2 diabetes mellitus with other diabetic kidney complication: Secondary | ICD-10-CM | POA: Diagnosis not present

## 2021-06-11 DIAGNOSIS — N189 Chronic kidney disease, unspecified: Secondary | ICD-10-CM | POA: Diagnosis not present

## 2021-06-11 DIAGNOSIS — R809 Proteinuria, unspecified: Secondary | ICD-10-CM | POA: Diagnosis not present

## 2021-06-16 ENCOUNTER — Encounter: Payer: Self-pay | Admitting: Internal Medicine

## 2021-06-16 NOTE — Telephone Encounter (Signed)
Hendricks Limes, MD  You 1 hour ago (3:24 PM)   Please route this to [employer].

## 2021-06-25 ENCOUNTER — Other Ambulatory Visit (HOSPITAL_COMMUNITY)
Admission: RE | Admit: 2021-06-25 | Discharge: 2021-06-25 | Disposition: A | Discharge status: Home / Self Care | Payer: Medicare Other | Source: Skilled Nursing Facility | Attending: Internal Medicine | Admitting: Internal Medicine

## 2021-06-25 DIAGNOSIS — I1310 Hypertensive heart and chronic kidney disease without heart failure, with stage 1 through stage 4 chronic kidney disease, or unspecified chronic kidney disease: Secondary | ICD-10-CM | POA: Diagnosis present

## 2021-06-25 DIAGNOSIS — D509 Iron deficiency anemia, unspecified: Secondary | ICD-10-CM | POA: Insufficient documentation

## 2021-06-25 DIAGNOSIS — N189 Chronic kidney disease, unspecified: Secondary | ICD-10-CM | POA: Diagnosis not present

## 2021-06-25 LAB — BASIC METABOLIC PANEL
Anion gap: 9 (ref 5–15)
BUN: 31 mg/dL — ABNORMAL HIGH (ref 8–23)
CO2: 24 mmol/L (ref 22–32)
Calcium: 9.1 mg/dL (ref 8.9–10.3)
Chloride: 105 mmol/L (ref 98–111)
Creatinine, Ser: 1.26 mg/dL — ABNORMAL HIGH (ref 0.44–1.00)
GFR, Estimated: 44 mL/min — ABNORMAL LOW (ref >60)
Glucose, Bld: 82 mg/dL (ref 70–99)
Potassium: 4.6 mmol/L (ref 3.5–5.1)
Sodium: 138 mmol/L (ref 135–145)

## 2021-06-27 ENCOUNTER — Non-Acute Institutional Stay (SKILLED_NURSING_FACILITY): Payer: Medicare Other | Admitting: Adult Health

## 2021-06-27 ENCOUNTER — Encounter: Payer: Self-pay | Admitting: Adult Health

## 2021-06-27 DIAGNOSIS — G959 Disease of spinal cord, unspecified: Secondary | ICD-10-CM

## 2021-06-27 DIAGNOSIS — E1122 Type 2 diabetes mellitus with diabetic chronic kidney disease: Secondary | ICD-10-CM | POA: Diagnosis not present

## 2021-06-27 DIAGNOSIS — I700 Atherosclerosis of aorta: Secondary | ICD-10-CM | POA: Diagnosis not present

## 2021-06-27 DIAGNOSIS — N183 Chronic kidney disease, stage 3 unspecified: Secondary | ICD-10-CM

## 2021-06-27 NOTE — Progress Notes (Signed)
Location:  [facility] Room Number: 150 Place of Service:  SNF (31)   CODE STATUS: dnr   Allergies  Allergen Reactions   Latex Rash   Levofloxacin Nausea And Vomiting   Neomycin-Bacitracin-Polymyxin  [Bacitracin-Neomycin-Polymyxin] Rash   Other Swelling and Rash    TOMATO  SOUP-PER MAR   Prednisone Hives, Itching, Rash and Other (See Comments)   Fish Allergy     rash   Shellfish Allergy Hives   Neosporin [Neomycin-Polymyxin-Gramicidin] Itching and Rash    Chief Complaint  Patient presents with   Acute Visit    Care plan meeting     HPI:  [name]  BIMS 15/15 mood 0/30. She requires limited to extensive assist. She is frequently incontinent of bladder and bowel.  No falls.  Dietary: 106.8 pounds weight is stable regular diet; has a good appetite feeds self. Therapy none at this time. 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  Cervical myelopathy  Past Medical History:  Diagnosis Date   Allergy    Anemia    pernicious anemia   Arthritis    Asthma    Blood transfusion without reported diagnosis    CKD (chronic kidney disease)    COPD (chronic obstructive pulmonary disease) ([place]) 12/23/2019   Diabetic peripheral neuropathy associated with type 2 diabetes mellitus ([place]) 04/07/2021   Gastritis    GERD (gastroesophageal reflux disease)    Glaucoma    History of blood in urine    Hyperlipidemia    Hypertension    Neuropathy    both feet   Ocular hypertension    Osteoporosis    Stroke ([place])    Type 2 diabetes mellitus ([place]) 12/23/2019   Vitamin B12 deficiency anemia due to intrinsic factor deficiency     Past Surgical History:  Procedure Laterality Date   CHOLECYSTECTOMY  2008   COLONOSCOPY  04/30/2011   Procedure: COLONOSCOPY;  Surgeon: Rogene Houston, MD;  [facility];  Service: Endoscopy;   Laterality: N/A;   COLONOSCOPY  05/25/2012   Procedure: COLONOSCOPY;  Surgeon: Rogene Houston, MD;  [facility];  Service: Endoscopy;  Laterality: N/A;  1:25-changed to 1200 Ann to notify pt   COLONOSCOPY Bilateral 12/2017   COLONOSCOPY WITH PROPOFOL N/A 11/29/2020   Procedure: COLONOSCOPY WITH PROPOFOL;  Surgeon: Harvel Quale, MD;  [facility];  Service: Gastroenterology;  Laterality: N/A;  1:35 [facility] is in the [facility]   ESOPHAGOGASTRODUODENOSCOPY N/A 12/07/2019   Procedure: ESOPHAGOGASTRODUODENOSCOPY (EGD);  Surgeon: Rogene Houston, MD;  [facility];  Service: Endoscopy;  Laterality: N/A;  155   GALLBLADDER SURGERY     right breast cystectomy  1988   TUBAL LIGATION  1975    Social History   Socioeconomic History   Marital status: Divorced    Spouse name: Not on file   Number of children: 4   Years of education: 9   Highest education level: Not on file  Occupational History   Occupation: retired   Tobacco Use   Smoking status: Former    Packs/day: 1.00    Years: 40.00    Pack years: 40.00    Types: Cigarettes    Quit date: 04/03/2011    Years since quitting: 10.2   Smokeless tobacco: Never  Vaping Use   Vaping Use: Never  used  Substance and Sexual Activity   Alcohol use: No   Drug use: No   Sexual activity: Not Currently  Other Topics Concern   Not on file  Social History Narrative   01/17/[birth year] lives at [facility], [place]   Social Determinants of Health   Financial Resource Strain: Not on file  Food Insecurity: Not on file  Transportation Needs: Not on file  Physical Activity: Not on file  Stress: Not on file  Social Connections: Not on file  Intimate Partner Violence: Not on file   Family History  Problem Relation Age of Onset   Diabetes Mother    Hyperlipidemia Mother    Hypertension Mother    Heart disease Father    Hyperlipidemia Father    Diabetes Sister    Hyperlipidemia Sister     Hypertension Sister    Cancer Brother    Heart disease Brother    Hyperlipidemia Brother    Hypertension Brother    Diabetes Sister       VITAL SIGNS BP 132/78    Pulse 70    Temp 98.5 F (36.9 C)    Ht 5\' 4"  (1.626 m)    Wt 106 lb 12.8 oz (48.4 kg)    BMI 18.33 kg/m   Outpatient Encounter Medications as of 06/27/2021  Medication Sig   acetaminophen (TYLENOL) 325 MG tablet Take 2 tablets (650 mg total) by mouth every 6 (six) hours as needed for mild pain (or Fever >/= 101).   albuterol (VENTOLIN HFA) 108 (90 Base) MCG/ACT inhaler Inhale 2 puffs into the lungs every 4 (four) hours as needed for wheezing or shortness of breath.   amLODipine (NORVASC) 10 MG tablet Take 1 tablet (10 mg total) by mouth daily.   aspirin EC 81 MG tablet Take 81 mg by mouth daily. Swallow whole.   Balsam [place]-Castor Oil (VENELEX) OINT Apply 1 application topically as needed. Apply to sacrum and bilateral buttocks Every Shift - PRN; PRN 1, PRN 2, PRN 3   cyanocobalamin (,VITAMIN B-12,) 1000 MCG/ML injection Inject 1 mL (1,000 mcg total) into the muscle every 30 (thirty) days.   docusate sodium (COLACE) 100 MG capsule Take 1 capsule (100 mg total) by mouth 2 (two) times daily.   dorzolamide (TRUSOPT) 2 % ophthalmic solution Place 1 drop into both eyes 2 (two) times daily. (0900 & 1700)   ergocalciferol (VITAMIN D2) 1.25 MG (50000 UT) capsule Take 50,000 Units by mouth once a week. Thursday   folic acid (FOLVITE) 1 MG tablet Take 1 mg by mouth daily. (0900)   gabapentin (NEURONTIN) 100 MG capsule Take 100 mg by mouth at bedtime. (2100)   Iron, Ferrous Sulfate, 325 (65 Fe) MG TABS Take by mouth. amt: 1 tab; oral,Once A Day   latanoprost (XALATAN) 0.005 % ophthalmic solution Place 1 drop into both eyes at bedtime. (2100)   loperamide (IMODIUM A-D) 2 MG tablet Take 2 mg by mouth every 4 (four) hours as needed for diarrhea or loose stools.   metoCLOPramide (REGLAN) 5 MG tablet Take 1 tablet (5 mg total) by mouth 3  (three) times daily before meals.   NON FORMULARY Regular thin liquids  ALLERGIC TO FISH   omeprazole (PRILOSEC) 40 MG capsule Take 40 mg by mouth daily at 6 (six) AM. (0600)   ondansetron (ZOFRAN) 4 MG tablet Take 4 mg by mouth every 6 (six) hours as needed for nausea or vomiting.   Wound Dressings (ALLEVYN LIFE HEEL) PADS Apply 1 patch  topically as directed. Every 5 days and as needed for prevention   No facility-administered encounter medications on file as of 06/27/2021.     SIGNIFICANT DIAGNOSTIC EXAMS   PREVIOUS  05-24-20: dexa: t score -2.387  NO NEW EXAMS.     LABS REVIEWED PREVIOUS   08-27-20: wbc 7.8; hgb 11.0; hct 35.0; mcv 994.6 plt 218 09-05-20: tsh 2.291 free t4: 0.96 09-10-20: wbc 6.8; hgb 10.2; hct 33.2; mcv 94.3 plt 193 10-08-20: wbc 6.6; hgb 10.2; hct 33.0; mcv 94.8 plt 180;  10-21-20: chol 169; ldl 103; trig 83 hdl 49; hgb a1c 5.0 11-07-20: glucose 87; bun 24; creat 1.15; k+ 3.8; na++ 141; ca 9.2 GFR 49; phos 3.9; albumin 3.6; iron 31; TIBC 185; urine micro-albumin 21.9 11-19-20: wbc 5.8; hgb 10.1; hct 33.1; mcv 94.0 plt 199  01-28-21: wbc 6.9; hgb 11.3; hct 35.6; mcv 93.7 plt 193 01-30-21: hgb a1c 5.2 02-07-21: wbc 7.1; hgb 11.3; hct 34.6; mcv 92.3 plt 185; glucose 117; bun 24; creat 1.16; k+ 4.2; na++ 134; ca 9.4 GFR 49; phos 3.8; albumin 4.2; vit D 27.34 PTH 22; iron 32; tibc 226 02-25-21: vit B 12: 764; folate >100 03-25-21: wbc 8.8; hgb 11.4; hct 36.7; mcv 94.6 plt 203   NO NEW LABS.   Review of Systems  Constitutional:  Negative for malaise/fatigue.  Respiratory:  Negative for cough and shortness of breath.   Cardiovascular:  Negative for chest pain, palpitations and leg swelling.  Gastrointestinal:  Negative for abdominal pain, constipation and heartburn.  Musculoskeletal:  Negative for back pain, joint pain and myalgias.  Skin: Negative.   Neurological:  Negative for dizziness.  Psychiatric/Behavioral:  The patient is not nervous/anxious.    Physical  Exam Constitutional:      General: She is not in acute distress.    Appearance: She is well-developed. She is not diaphoretic.  Neck:     Thyroid: No thyromegaly.  Cardiovascular:     Rate and Rhythm: Normal rate and regular rhythm.     Pulses: Normal pulses.     Heart sounds: Normal heart sounds.  Pulmonary:     Effort: Pulmonary effort is normal. No respiratory distress.     Breath sounds: Normal breath sounds.  Abdominal:     General: Bowel sounds are normal. There is no distension.     Palpations: Abdomen is soft.     Tenderness: There is no abdominal tenderness.  Musculoskeletal:        General: Normal range of motion.     Cervical back: Neck supple.     Right lower leg: No edema.     Left lower leg: No edema.  Lymphadenopathy:     Cervical: No cervical adenopathy.  Skin:    General: Skin is warm and dry.  Neurological:     Mental Status: She is alert and oriented to person, place, and time.  Psychiatric:        Mood and Affect: Mood normal.     ASSESSMENT/ PLAN:  TODAY  Aortic atherosclerosis CKD stage 3 due to diabetes mellitus type 2 Cervical myelopathy   Will continue current medications Will continue current plan of care Will monitor her status.    Time spent with patient: 40 minutes: medications; care plan.    Ok Edwards NP [facility]  Contact [phone] Monday through Friday 8am- 5pm  After hours call [phone]

## 2021-06-30 ENCOUNTER — Encounter: Payer: Self-pay | Admitting: Adult Health

## 2021-06-30 ENCOUNTER — Non-Acute Institutional Stay (SKILLED_NURSING_FACILITY): Payer: Medicare Other | Admitting: Adult Health

## 2021-06-30 DIAGNOSIS — D631 Anemia in chronic kidney disease: Secondary | ICD-10-CM

## 2021-06-30 DIAGNOSIS — K2950 Unspecified chronic gastritis without bleeding: Secondary | ICD-10-CM

## 2021-06-30 DIAGNOSIS — N183 Chronic kidney disease, stage 3 unspecified: Secondary | ICD-10-CM | POA: Diagnosis not present

## 2021-06-30 DIAGNOSIS — I129 Hypertensive chronic kidney disease with stage 1 through stage 4 chronic kidney disease, or unspecified chronic kidney disease: Secondary | ICD-10-CM

## 2021-06-30 DIAGNOSIS — H40053 Ocular hypertension, bilateral: Secondary | ICD-10-CM | POA: Diagnosis not present

## 2021-06-30 DIAGNOSIS — E1122 Type 2 diabetes mellitus with diabetic chronic kidney disease: Secondary | ICD-10-CM | POA: Diagnosis not present

## 2021-06-30 NOTE — Progress Notes (Signed)
Location:  [facility] Room Number: 150-P Place of Service:  SNF (31)   CODE STATUS: DNR  Allergies  Allergen Reactions   Latex Rash   Levofloxacin Nausea And Vomiting   Neomycin-Bacitracin-Polymyxin  [Bacitracin-Neomycin-Polymyxin] Rash   Other Swelling and Rash    TOMATO  SOUP-PER MAR   Prednisone Hives, Itching, Rash and Other (See Comments)   Fish Allergy     rash   Shellfish Allergy Hives   Neosporin [Neomycin-Polymyxin-Gramicidin] Itching and Rash    Chief Complaint  Patient presents with   Medical Management of Chronic Issues            Other chronic gastritis without hemorrhage:  Hypertension associated with stage 3 chronic kidney disease due to type 2 diabetes mellitus:. Anemia due to chronic renal failure treated with erythropoietin stage 3: Increased intraocular pressure bilateral:    HPI:  She is a 75 year old long term resident of this facility being seen for the management of her chronic illnesses:  Other chronic gastritis without hemorrhage:  Hypertension associated with stage 3 chronic kidney disease due to type 2 diabetes mellitus:. Anemia due to chronic renal failure treated with erythropoietin stage 3: Increased intraocular pressure bilateral. There are no reports of uncontrolled pain. No reports of changes in appetite; is slowly regaining her weight.   Past Medical History:  Diagnosis Date   Allergy    Anemia    pernicious anemia   Arthritis    Asthma    Blood transfusion without reported diagnosis    CKD (chronic kidney disease)    COPD (chronic obstructive pulmonary disease) ([place]) 12/23/2019   Diabetic peripheral neuropathy associated with type 2 diabetes mellitus ([place]) 04/07/2021   Gastritis    GERD (gastroesophageal reflux disease)    Glaucoma    History of blood in urine    Hyperlipidemia    Hypertension    Neuropathy    both feet   Ocular hypertension    Osteoporosis    Stroke ([place])    Type 2 diabetes mellitus ([place])  12/23/2019   Vitamin B12 deficiency anemia due to intrinsic factor deficiency     Past Surgical History:  Procedure Laterality Date   CHOLECYSTECTOMY  2008   COLONOSCOPY  04/30/2011   Procedure: COLONOSCOPY;  Surgeon: Rogene Houston, MD;  [facility];  Service: Endoscopy;  Laterality: N/A;   COLONOSCOPY  05/25/2012   Procedure: COLONOSCOPY;  Surgeon: Rogene Houston, MD;  [facility];  Service: Endoscopy;  Laterality: N/A;  1:25-changed to 1200 Ann to notify pt   COLONOSCOPY Bilateral 12/2017   COLONOSCOPY WITH PROPOFOL N/A 11/29/2020   Procedure: COLONOSCOPY WITH PROPOFOL;  Surgeon: Harvel Quale, MD;  [facility];  Service: Gastroenterology;  Laterality: N/A;  1:35 [facility] is in the [facility]   ESOPHAGOGASTRODUODENOSCOPY N/A 12/07/2019   Procedure: ESOPHAGOGASTRODUODENOSCOPY (EGD);  Surgeon: Rogene Houston, MD;  [facility];  Service: Endoscopy;  Laterality: N/A;  155   GALLBLADDER SURGERY     right breast cystectomy  1988   TUBAL LIGATION  1975    Social History   Socioeconomic History   Marital status: Divorced    Spouse name: Not on file   Number of children: 4   Years of education: 9   Highest education level: Not on file  Occupational History   Occupation: retired   Tobacco Use   Smoking status: Former    Packs/day: 1.00    Years:  40.00    Pack years: 40.00    Types: Cigarettes    Quit date: 04/03/2011    Years since quitting: 10.2   Smokeless tobacco: Never  Vaping Use   Vaping Use: Never used  Substance and Sexual Activity   Alcohol use: No   Drug use: No   Sexual activity: Not Currently  Other Topics Concern   Not on file  Social History Narrative   01/17/20 lives at [facility], [place]   Social Determinants of Health   Financial Resource Strain: Not on file  Food Insecurity: Not on file  Transportation Needs: Not on file  Physical Activity: Not on file  Stress: Not on file  Social  Connections: Not on file  Intimate Partner Violence: Not on file   Family History  Problem Relation Age of Onset   Diabetes Mother    Hyperlipidemia Mother    Hypertension Mother    Heart disease Father    Hyperlipidemia Father    Diabetes Sister    Hyperlipidemia Sister    Hypertension Sister    Cancer Brother    Heart disease Brother    Hyperlipidemia Brother    Hypertension Brother    Diabetes Sister       VITAL SIGNS BP 125/66    Pulse 69    Temp (!) 96.8 F (36 C)    Resp 20    Ht 5\' 4"  (1.626 m)    Wt 106 lb 12.8 oz (48.4 kg)    SpO2 93%    BMI 18.33 kg/m   Outpatient Encounter Medications as of 06/30/2021  Medication Sig   acetaminophen (TYLENOL) 325 MG tablet Take 2 tablets (650 mg total) by mouth every 6 (six) hours as needed for mild pain (or Fever >/= 101).   albuterol (VENTOLIN HFA) 108 (90 Base) MCG/ACT inhaler Inhale 2 puffs into the lungs every 4 (four) hours as needed for wheezing or shortness of breath.   amLODipine (NORVASC) 10 MG tablet Take 1 tablet (10 mg total) by mouth daily.   aspirin EC 81 MG tablet Take 81 mg by mouth daily. Swallow whole.   Balsam [place]-Castor Oil (VENELEX) OINT Apply 1 application topically as needed. Apply to sacrum and bilateral buttocks Every Shift - PRN; PRN 1, PRN 2, PRN 3   cyanocobalamin (,VITAMIN B-12,) 1000 MCG/ML injection Inject 1 mL (1,000 mcg total) into the muscle every 30 (thirty) days.   docusate sodium (COLACE) 100 MG capsule Take 1 capsule (100 mg total) by mouth 2 (two) times daily.   dorzolamide (TRUSOPT) 2 % ophthalmic solution Place 1 drop into both eyes 2 (two) times daily. (0900 & 1700)   ergocalciferol (VITAMIN D2) 1.25 MG (50000 UT) capsule Take 50,000 Units by mouth once a week. Thursday   folic acid (FOLVITE) 1 MG tablet Take 1 mg by mouth daily. (0900)   gabapentin (NEURONTIN) 100 MG capsule Take 100 mg by mouth at bedtime. (2100)   Iron, Ferrous Sulfate, 325 (65 Fe) MG TABS Take by mouth. amt: 1 tab;  oral,Once A Day   latanoprost (XALATAN) 0.005 % ophthalmic solution Place 1 drop into both eyes at bedtime. (2100)   loperamide (IMODIUM A-D) 2 MG tablet Take 2 mg by mouth every 4 (four) hours as needed for diarrhea or loose stools.   losartan (COZAAR) 25 MG tablet Take 25 mg by mouth daily.   metoCLOPramide (REGLAN) 5 MG tablet Take 1 tablet (5 mg total) by mouth 3 (three) times daily before meals.  NON FORMULARY Regular thin liquids  ALLERGIC TO FISH   omeprazole (PRILOSEC) 40 MG capsule Take 40 mg by mouth daily at 6 (six) AM. (0600)   ondansetron (ZOFRAN) 4 MG tablet Take 4 mg by mouth every 6 (six) hours as needed for nausea or vomiting.   Wound Dressings (ALLEVYN LIFE HEEL) PADS Apply 1 patch topically as directed. Every 5 days and as needed for prevention   No facility-administered encounter medications on file as of 06/30/2021.     SIGNIFICANT DIAGNOSTIC EXAMS  PREVIOUS  05-24-20: dexa: t score -2.387  NO NEW EXAMS.     LABS REVIEWED PREVIOUS    08-27-20: wbc 7.8; hgb 11.0; hct 35.0; mcv 994.6 plt 218 09-05-20: tsh 2.291 free t4: 0.96 09-10-20: wbc 6.8; hgb 10.2; hct 33.2; mcv 94.3 plt 193 10-08-20: wbc 6.6; hgb 10.2; hct 33.0; mcv 94.8 plt 180;  10-21-20: chol 169; ldl 103; trig 83 hdl 49; hgb a1c 5.0 11-07-20: glucose 87; bun 24; creat 1.15; k+ 3.8; na++ 141; ca 9.2 GFR 49; phos 3.9; albumin 3.6; iron 31; TIBC 185; urine micro-albumin 21.9 11-19-20: wbc 5.8; hgb 10.1; hct 33.1; mcv 94.0 plt 199  01-28-21: wbc 6.9; hgb 11.3; hct 35.6; mcv 93.7 plt 193 01-30-21: hgb a1c 5.2 02-07-21: wbc 7.1; hgb 11.3; hct 34.6; mcv 92.3 plt 185; glucose 117; bun 24; creat 1.16; k+ 4.2; na++ 134; ca 9.4 GFR 49; phos 3.8; albumin 4.2; vit D 27.34 PTH 22; iron 32; tibc 226 02-25-21: vit B 12: 764; folate >100 03-25-21: wbc 8.8; hgb 11.4; hct 36.7; mcv 94.6 plt 203   TODAY  04-22-21: wbc 7.5; hgb 11.3; hct 36.5; mcv 93.4 plt 165 05-06-21: glucose 91; bun 25; creat 1.17; k+ 4.2; na++ 135; ca 8.9;  liver normal albumin 3.9; hgb a1c 5.3; chol 210; ldl 139; trig 66; hdl 58; vit D 41.14 05-20-21: wbc 8.4; hgb 12.2; hct 40.3; mcv 95.3 plt 176; vit B 12: 777 iron 56; tibc 215; ferritin 284 folate>100.0  06-25-21: glucose 82; bun 31; creat 1.26; k+ 4.6; na++ 138; ca 9.1; GFR 44    Review of Systems  Constitutional:  Negative for malaise/fatigue.  Respiratory:  Negative for cough and shortness of breath.   Cardiovascular:  Negative for chest pain, palpitations and leg swelling.  Gastrointestinal:  Negative for abdominal pain, constipation and heartburn.  Musculoskeletal:  Negative for back pain, joint pain and myalgias.  Skin: Negative.   Neurological:  Negative for dizziness.  Psychiatric/Behavioral:  The patient is not nervous/anxious.    Physical Exam Constitutional:      General: She is not in acute distress.    Appearance: She is well-developed. She is not diaphoretic.  Neck:     Thyroid: No thyromegaly.  Cardiovascular:     Rate and Rhythm: Normal rate and regular rhythm.     Pulses: Normal pulses.     Heart sounds: Normal heart sounds.  Pulmonary:     Effort: Pulmonary effort is normal. No respiratory distress.     Breath sounds: Normal breath sounds.  Abdominal:     General: Bowel sounds are normal. There is no distension.     Palpations: Abdomen is soft.     Tenderness: There is no abdominal tenderness.  Musculoskeletal:        General: Normal range of motion.     Cervical back: Neck supple.     Right lower leg: No edema.     Left lower leg: No edema.  Lymphadenopathy:  Cervical: No cervical adenopathy.  Skin:    General: Skin is warm and dry.  Neurological:     Mental Status: She is alert and oriented to person, place, and time.  Psychiatric:        Mood and Affect: Mood normal.     ASSESSMENT/ PLAN:  TODAY    Other chronic gastritis without hemorrhage: is stable will continue prilosec 40 mg daily reglan 5 mg prior to meals  2. Hypertension associated  with stage 3 chronic kidney disease due to type 2 diabetes mellitus: is stable b/p 125/66 will continue norvasc 10 mg daily   3. Anemia due to chronic renal failure treated with erythropoietin stage 3: hgb 12.2; his followed by oncology  4. Increased intraocular pressure bilateral: is stable will continue trusopt to both eyes twice daily and xalatan to both eyes nightly   PREVIOUS   5. Vit B 12 deficiency is stable  level 777; folate >100 will continue monthly injections and folic acid 1 mg daily   6. Vitamin D deficiency: level is 41.14 will continue vitamin D 50,000 units weekly   7. Thyroiditis: is stable tsh 2.291 free t4: 0.96  8. Cervical myelopathy: is without change;  will monitor   9. Failure to thrive in adult: is improving weight is 106 pounds; will continue supplements as ordered; cannot take megace due to DVT  10. Aortic atherosclerosis: is stable (ct 12-23-19) will monitor  11. History of DVT of left lower extremity: is stable (02-02-20) induced by megace is on long term asa 81 mg daily   12. Chronic obstructive pulmonary disease unspecified COPD type: is stable will continue albuterol 2 puffs every 4 hours as needed  14. CKD stage 3 due to diabetes mellitus type 2: bun 31; creat 1.26; GFR 44  15. Diabetic peripheral neuropathy associated with type 2 diabetes mellitus is stable will continue gabapentin 100 mg nightly   16. Type 2 diabetes mellitus with stage 3 chronic kidney disease without long term current use of insulin hgb a1c is 5.3 is on asa 81 mg daily          Ok Edwards NP [facility]  Contact [phone] Monday through Friday 8am- 5pm  After hours call [phone]

## 2021-07-17 DIAGNOSIS — Z1159 Encounter for screening for other viral diseases: Secondary | ICD-10-CM | POA: Diagnosis not present

## 2021-07-17 DIAGNOSIS — I82402 Acute embolism and thrombosis of unspecified deep veins of left lower extremity: Secondary | ICD-10-CM | POA: Diagnosis not present

## 2021-07-24 DIAGNOSIS — Z1159 Encounter for screening for other viral diseases: Secondary | ICD-10-CM | POA: Diagnosis not present

## 2021-07-24 DIAGNOSIS — I82402 Acute embolism and thrombosis of unspecified deep veins of left lower extremity: Secondary | ICD-10-CM | POA: Diagnosis not present

## 2021-07-31 DIAGNOSIS — I1310 Hypertensive heart and chronic kidney disease without heart failure, with stage 1 through stage 4 chronic kidney disease, or unspecified chronic kidney disease: Secondary | ICD-10-CM | POA: Diagnosis not present

## 2021-07-31 DIAGNOSIS — Z9181 History of falling: Secondary | ICD-10-CM | POA: Diagnosis not present

## 2021-07-31 DIAGNOSIS — Z1159 Encounter for screening for other viral diseases: Secondary | ICD-10-CM | POA: Diagnosis not present

## 2021-07-31 DIAGNOSIS — M4802 Spinal stenosis, cervical region: Secondary | ICD-10-CM | POA: Diagnosis not present

## 2021-07-31 DIAGNOSIS — I82402 Acute embolism and thrombosis of unspecified deep veins of left lower extremity: Secondary | ICD-10-CM | POA: Diagnosis not present

## 2021-07-31 DIAGNOSIS — R262 Difficulty in walking, not elsewhere classified: Secondary | ICD-10-CM | POA: Diagnosis not present

## 2021-07-31 DIAGNOSIS — M6281 Muscle weakness (generalized): Secondary | ICD-10-CM | POA: Diagnosis not present

## 2021-08-01 ENCOUNTER — Non-Acute Institutional Stay (SKILLED_NURSING_FACILITY): Payer: Medicare Other | Admitting: Adult Health

## 2021-08-01 ENCOUNTER — Encounter: Payer: Self-pay | Admitting: Adult Health

## 2021-08-01 DIAGNOSIS — E559 Vitamin D deficiency, unspecified: Secondary | ICD-10-CM

## 2021-08-01 DIAGNOSIS — I1310 Hypertensive heart and chronic kidney disease without heart failure, with stage 1 through stage 4 chronic kidney disease, or unspecified chronic kidney disease: Secondary | ICD-10-CM | POA: Diagnosis not present

## 2021-08-01 DIAGNOSIS — M4802 Spinal stenosis, cervical region: Secondary | ICD-10-CM | POA: Diagnosis not present

## 2021-08-01 DIAGNOSIS — E538 Deficiency of other specified B group vitamins: Secondary | ICD-10-CM | POA: Diagnosis not present

## 2021-08-01 DIAGNOSIS — G959 Disease of spinal cord, unspecified: Secondary | ICD-10-CM | POA: Diagnosis not present

## 2021-08-01 DIAGNOSIS — E069 Thyroiditis, unspecified: Secondary | ICD-10-CM | POA: Diagnosis not present

## 2021-08-01 DIAGNOSIS — Z9181 History of falling: Secondary | ICD-10-CM | POA: Diagnosis not present

## 2021-08-01 DIAGNOSIS — R262 Difficulty in walking, not elsewhere classified: Secondary | ICD-10-CM | POA: Diagnosis not present

## 2021-08-01 DIAGNOSIS — M6281 Muscle weakness (generalized): Secondary | ICD-10-CM | POA: Diagnosis not present

## 2021-08-01 NOTE — Progress Notes (Signed)
Location:  [place] Room Number: 150-P Place of Service:  SNF (31)   CODE STATUS: DNR  Allergies  Allergen Reactions   Latex Rash   Levofloxacin Nausea And Vomiting   Neomycin-Bacitracin-Polymyxin  [Bacitracin-Neomycin-Polymyxin] Rash   Other Swelling and Rash    TOMATO  SOUP-PER MAR   Prednisone Hives, Itching, Rash and Other (See Comments)   Fish Allergy     rash   Shellfish Allergy Hives   Neosporin [Neomycin-Polymyxin-Gramicidin] Itching and Rash   Chief Complaint  Patient presents with   Medical Management of Chronic Issues             Vitamin B12 deficiency: Vitamin D deficiency Thyroiditis:  Cervical myelopathy     HPI:  She is a 76 year old long term resident of this facility being seen for the management of her chronic illnesses: Vitamin B12 deficiency: Vitamin D deficiency Thyroiditis:  Cervical myelopathy. There are no reports of uncontrolled pain. There are no reports of changes in appetite; weight is stable; no reports of anxiety or depressive thoughts.   Past Medical History:  Diagnosis Date   Allergy    Anemia    pernicious anemia   Arthritis    Asthma    Blood transfusion without reported diagnosis    CKD (chronic kidney disease)    COPD (chronic obstructive pulmonary disease) ([place]) 12/23/2019   Diabetic peripheral neuropathy associated with type 2 diabetes mellitus ([place]) 04/07/2021   Gastritis    GERD (gastroesophageal reflux disease)    Glaucoma    History of blood in urine    Hyperlipidemia    Hypertension    Neuropathy    both feet   Ocular hypertension    Osteoporosis    Stroke ([place])    Type 2 diabetes mellitus ([place]) 12/23/2019   Vitamin B12 deficiency anemia due to intrinsic factor deficiency     Past Surgical History:  Procedure Laterality Date   CHOLECYSTECTOMY  2008   COLONOSCOPY  04/30/2011   Procedure: COLONOSCOPY;  Surgeon: Rogene Houston, MD;  [facility];  Service: Endoscopy;  Laterality:  N/A;   COLONOSCOPY  05/25/2012   Procedure: COLONOSCOPY;  Surgeon: Rogene Houston, MD;  [facility];  Service: Endoscopy;  Laterality: N/A;  1:25-changed to 1200 Ann to notify pt   COLONOSCOPY Bilateral 12/2017   COLONOSCOPY WITH PROPOFOL N/A 11/29/2020   Procedure: COLONOSCOPY WITH PROPOFOL;  Surgeon: Harvel Quale, MD;  [facility];  Service: Gastroenterology;  Laterality: N/A;  1:35 patient is in the [facility]   ESOPHAGOGASTRODUODENOSCOPY N/A 12/07/2019   Procedure: ESOPHAGOGASTRODUODENOSCOPY (EGD);  Surgeon: Rogene Houston, MD;  [facility];  Service: Endoscopy;  Laterality: N/A;  155   GALLBLADDER SURGERY     right breast cystectomy  1988   TUBAL LIGATION  1975    Social History   Socioeconomic History   Marital status: Divorced    Spouse name: Not on file   Number of children: 4   Years of education: 9   Highest education level: Not on file  Occupational History   Occupation: retired   Tobacco Use   Smoking status: Former    Packs/day: 1.00    Years: 40.00    Pack years: 40.00    Types: Cigarettes    Quit date: 04/03/2011    Years since quitting: 10.3   Smokeless tobacco: Never  Vaping Use   Vaping Use: Never used  Substance and Sexual  Activity   Alcohol use: No   Drug use: No   Sexual activity: Not Currently  Other Topics Concern   Not on file  Social History Narrative   01/17/20 lives at [facility], [place]   Social Determinants of Health   Financial Resource Strain: Not on file  Food Insecurity: Not on file  Transportation Needs: Not on file  Physical Activity: Not on file  Stress: Not on file  Social Connections: Not on file  Intimate Partner Violence: Not on file   Family History  Problem Relation Age of Onset   Diabetes Mother    Hyperlipidemia Mother    Hypertension Mother    Heart disease Father    Hyperlipidemia Father    Diabetes Sister    Hyperlipidemia Sister    Hypertension Sister     Cancer Brother    Heart disease Brother    Hyperlipidemia Brother    Hypertension Brother    Diabetes Sister       VITAL SIGNS BP 119/66    Pulse 83    Temp (!) 96.8 F (36 C)    Resp 20    Ht 5\' 4"  (1.626 m)    Wt 106 lb 6.4 oz (48.3 kg)    SpO2 93%    BMI 18.26 kg/m   Outpatient Encounter Medications as of 08/01/2021  Medication Sig   acetaminophen (TYLENOL) 325 MG tablet Take 2 tablets (650 mg total) by mouth every 6 (six) hours as needed for mild pain (or Fever >/= 101).   albuterol (VENTOLIN HFA) 108 (90 Base) MCG/ACT inhaler Inhale 2 puffs into the lungs every 4 (four) hours as needed for wheezing or shortness of breath.   amLODipine (NORVASC) 10 MG tablet Take 1 tablet (10 mg total) by mouth daily.   aspirin EC 81 MG tablet Take 81 mg by mouth daily. Swallow whole.   Balsam [place]-Castor Oil (VENELEX) OINT Apply 1 application topically as needed. Apply to sacrum and bilateral buttocks Every Shift - PRN; PRN 1, PRN 2, PRN 3   benzocaine-menthol (SORE THROAT) 6-10 MG lozenge Take 1 lozenge by mouth as needed for sore throat.   cyanocobalamin (,VITAMIN B-12,) 1000 MCG/ML injection Inject 1 mL (1,000 mcg total) into the muscle every 30 (thirty) days.   docusate sodium (COLACE) 100 MG capsule Take 1 capsule (100 mg total) by mouth 2 (two) times daily.   dorzolamide (TRUSOPT) 2 % ophthalmic solution Place 1 drop into both eyes 2 (two) times daily. (0900 & 1700)   ergocalciferol (VITAMIN D2) 1.25 MG (50000 UT) capsule Take 50,000 Units by mouth once a week. Thursday   folic acid (FOLVITE) 1 MG tablet Take 1 mg by mouth daily. (0900)   gabapentin (NEURONTIN) 100 MG capsule Take 100 mg by mouth at bedtime. (2100)   Iron, Ferrous Sulfate, 325 (65 Fe) MG TABS Take by mouth. amt: 1 tab; oral,Once A Day   latanoprost (XALATAN) 0.005 % ophthalmic solution Place 1 drop into both eyes at bedtime. (2100)   loperamide (IMODIUM A-D) 2 MG tablet Take 2 mg by mouth every 4 (four) hours as needed for  diarrhea or loose stools.   losartan (COZAAR) 25 MG tablet Take 25 mg by mouth daily.   metoCLOPramide (REGLAN) 5 MG tablet Take 1 tablet (5 mg total) by mouth 3 (three) times daily before meals.   NON FORMULARY Diet:Regular thin liquids  ALLERGIC TO FISH   omeprazole (PRILOSEC) 40 MG capsule Take 40 mg by mouth daily at  6 (six) AM. (0600)   ondansetron (ZOFRAN) 4 MG tablet Take 4 mg by mouth every 6 (six) hours as needed for nausea or vomiting.   rosuvastatin (CRESTOR) 5 MG tablet Take 5 mg by mouth. Once A Day on Mon, Wed, Fri   Wound Dressings (ALLEVYN LIFE HEEL) PADS Apply 1 patch topically as directed. Every 5 days and as needed for prevention   No facility-administered encounter medications on file as of 08/01/2021.     SIGNIFICANT DIAGNOSTIC EXAMS  PREVIOUS  05-24-20: dexa: t score -2.387  NO NEW EXAMS.     LABS REVIEWED PREVIOUS    08-27-20: wbc 7.8; hgb 11.0; hct 35.0; mcv 994.6 plt 218 09-05-20: tsh 2.291 free t4: 0.96 09-10-20: wbc 6.8; hgb 10.2; hct 33.2; mcv 94.3 plt 193 10-08-20: wbc 6.6; hgb 10.2; hct 33.0; mcv 94.8 plt 180;  10-21-20: chol 169; ldl 103; trig 83 hdl 49; hgb a1c 5.0 11-07-20: glucose 87; bun 24; creat 1.15; k+ 3.8; na++ 141; ca 9.2 GFR 49; phos 3.9; albumin 3.6; iron 31; TIBC 185; urine micro-albumin 21.9 11-19-20: wbc 5.8; hgb 10.1; hct 33.1; mcv 94.0 plt 199  01-28-21: wbc 6.9; hgb 11.3; hct 35.6; mcv 93.7 plt 193 01-30-21: hgb a1c 5.2 02-07-21: wbc 7.1; hgb 11.3; hct 34.6; mcv 92.3 plt 185; glucose 117; bun 24; creat 1.16; k+ 4.2; na++ 134; ca 9.4 GFR 49; phos 3.8; albumin 4.2; vit D 27.34 PTH 22; iron 32; tibc 226 02-25-21: vit B 12: 764; folate >100 03-25-21: wbc 8.8; hgb 11.4; hct 36.7; mcv 94.6 plt 203  04-22-21: wbc 7.5; hgb 11.3; hct 36.5; mcv 93.4 plt 165 05-06-21: glucose 91; bun 25; creat 1.17; k+ 4.2; na++ 135; ca 8.9; liver normal albumin 3.9; hgb a1c 5.3; chol 210; ldl 139; trig 66; hdl 58; vit D 41.14 05-20-21: wbc 8.4; hgb 12.2; hct 40.3; mcv  95.3 plt 176; vit B 12: 777 iron 56; tibc 215; ferritin 284 folate>100.0  06-25-21: glucose 82; bun 31; creat 1.26; k+ 4.6; na++ 138; ca 9.1; GFR 44   NO NEW LABS.   Review of Systems  Constitutional:  Negative for malaise/fatigue.  Respiratory:  Negative for cough and shortness of breath.   Cardiovascular:  Negative for chest pain, palpitations and leg swelling.  Gastrointestinal:  Negative for abdominal pain, constipation and heartburn.  Musculoskeletal:  Negative for back pain, joint pain and myalgias.  Skin: Negative.   Neurological:  Negative for dizziness.  Psychiatric/Behavioral:  The patient is not nervous/anxious.      Physical Exam Constitutional:      General: She is not in acute distress.    Appearance: She is well-developed. She is not diaphoretic.  Neck:     Thyroid: No thyromegaly.  Cardiovascular:     Rate and Rhythm: Normal rate and regular rhythm.     Pulses: Normal pulses.     Heart sounds: Normal heart sounds.  Pulmonary:     Effort: Pulmonary effort is normal. No respiratory distress.     Breath sounds: Normal breath sounds.  Abdominal:     General: Bowel sounds are normal. There is no distension.     Palpations: Abdomen is soft.     Tenderness: There is no abdominal tenderness.  Musculoskeletal:        General: Normal range of motion.     Cervical back: Neck supple.     Right lower leg: No edema.     Left lower leg: No edema.  Lymphadenopathy:     Cervical:  No cervical adenopathy.  Skin:    General: Skin is warm and dry.  Neurological:     Mental Status: She is alert and oriented to person, place, and time.  Psychiatric:        Mood and Affect: Mood normal.       ASSESSMENT/ PLAN:  TODAY    Vitamin B12 deficiency: is stable level 777; folate >100; will continue monthly injections and folic acid 1 mg daily   2. Vitamin D deficiency is stable level is 41.14 will continue vitamin D 50,000 units weekly   3. Thyroiditis: is stable tsh 2.291  free t4: 0.96  4. Cervical myelopathy: is stable will continue to monitor    PREVIOUS   5. Failure to thrive in adult: is improving weight is 106 pounds; will continue supplements as ordered; cannot take megace due to DVT  6. Aortic atherosclerosis: is stable (ct 12-23-19) will monitor  7. History of DVT of left lower extremity: is stable (02-02-20) induced by megace is on long term asa 81 mg daily   8. Chronic obstructive pulmonary disease unspecified COPD type: is stable will continue albuterol 2 puffs every 4 hours as needed  9. CKD stage 3 due to diabetes mellitus type 2: bun 31; creat 1.26; GFR 44  10. Diabetic peripheral neuropathy associated with type 2 diabetes mellitus is stable will continue gabapentin 100 mg nightly   11. Type 2 diabetes mellitus with stage 3 chronic kidney disease without long term current use of insulin hgb a1c is 5.3 is on asa 81 mg daily   12. Other chronic gastritis without hemorrhage: is stable will continue prilosec 40 mg daily reglan 5 mg prior to meals  13. Hypertension associated with stage 3 chronic kidney disease due to type 2 diabetes mellitus: is stable b/p 119/66 will continue norvasc 10 mg daily   14. Anemia due to chronic renal failure treated with erythropoietin stage 3: hgb 12.2; his followed by oncology  15. Increased intraocular pressure bilateral: is stable will continue trusopt to both eyes twice daily and xalatan to both eyes nightly    Ok Edwards NP [facility]   call [phone]

## 2021-08-04 DIAGNOSIS — E559 Vitamin D deficiency, unspecified: Secondary | ICD-10-CM | POA: Insufficient documentation

## 2021-08-05 DIAGNOSIS — M6281 Muscle weakness (generalized): Secondary | ICD-10-CM | POA: Diagnosis not present

## 2021-08-05 DIAGNOSIS — M4802 Spinal stenosis, cervical region: Secondary | ICD-10-CM | POA: Diagnosis not present

## 2021-08-05 DIAGNOSIS — Z9181 History of falling: Secondary | ICD-10-CM | POA: Diagnosis not present

## 2021-08-05 DIAGNOSIS — I1310 Hypertensive heart and chronic kidney disease without heart failure, with stage 1 through stage 4 chronic kidney disease, or unspecified chronic kidney disease: Secondary | ICD-10-CM | POA: Diagnosis not present

## 2021-08-05 DIAGNOSIS — R262 Difficulty in walking, not elsewhere classified: Secondary | ICD-10-CM | POA: Diagnosis not present

## 2021-08-06 DIAGNOSIS — I1310 Hypertensive heart and chronic kidney disease without heart failure, with stage 1 through stage 4 chronic kidney disease, or unspecified chronic kidney disease: Secondary | ICD-10-CM | POA: Diagnosis not present

## 2021-08-06 DIAGNOSIS — R262 Difficulty in walking, not elsewhere classified: Secondary | ICD-10-CM | POA: Diagnosis not present

## 2021-08-06 DIAGNOSIS — M6281 Muscle weakness (generalized): Secondary | ICD-10-CM | POA: Diagnosis not present

## 2021-08-06 DIAGNOSIS — M4802 Spinal stenosis, cervical region: Secondary | ICD-10-CM | POA: Diagnosis not present

## 2021-08-06 DIAGNOSIS — Z9181 History of falling: Secondary | ICD-10-CM | POA: Diagnosis not present

## 2021-08-07 DIAGNOSIS — Z1159 Encounter for screening for other viral diseases: Secondary | ICD-10-CM | POA: Diagnosis not present

## 2021-08-07 DIAGNOSIS — R262 Difficulty in walking, not elsewhere classified: Secondary | ICD-10-CM | POA: Diagnosis not present

## 2021-08-07 DIAGNOSIS — I1310 Hypertensive heart and chronic kidney disease without heart failure, with stage 1 through stage 4 chronic kidney disease, or unspecified chronic kidney disease: Secondary | ICD-10-CM | POA: Diagnosis not present

## 2021-08-07 DIAGNOSIS — I82402 Acute embolism and thrombosis of unspecified deep veins of left lower extremity: Secondary | ICD-10-CM | POA: Diagnosis not present

## 2021-08-07 DIAGNOSIS — Z9181 History of falling: Secondary | ICD-10-CM | POA: Diagnosis not present

## 2021-08-07 DIAGNOSIS — M6281 Muscle weakness (generalized): Secondary | ICD-10-CM | POA: Diagnosis not present

## 2021-08-07 DIAGNOSIS — M4802 Spinal stenosis, cervical region: Secondary | ICD-10-CM | POA: Diagnosis not present

## 2021-08-08 DIAGNOSIS — Z9181 History of falling: Secondary | ICD-10-CM | POA: Diagnosis not present

## 2021-08-08 DIAGNOSIS — I1310 Hypertensive heart and chronic kidney disease without heart failure, with stage 1 through stage 4 chronic kidney disease, or unspecified chronic kidney disease: Secondary | ICD-10-CM | POA: Diagnosis not present

## 2021-08-08 DIAGNOSIS — M6281 Muscle weakness (generalized): Secondary | ICD-10-CM | POA: Diagnosis not present

## 2021-08-08 DIAGNOSIS — M4802 Spinal stenosis, cervical region: Secondary | ICD-10-CM | POA: Diagnosis not present

## 2021-08-08 DIAGNOSIS — R262 Difficulty in walking, not elsewhere classified: Secondary | ICD-10-CM | POA: Diagnosis not present

## 2021-08-09 DIAGNOSIS — I1310 Hypertensive heart and chronic kidney disease without heart failure, with stage 1 through stage 4 chronic kidney disease, or unspecified chronic kidney disease: Secondary | ICD-10-CM | POA: Diagnosis not present

## 2021-08-09 DIAGNOSIS — R262 Difficulty in walking, not elsewhere classified: Secondary | ICD-10-CM | POA: Diagnosis not present

## 2021-08-09 DIAGNOSIS — M4802 Spinal stenosis, cervical region: Secondary | ICD-10-CM | POA: Diagnosis not present

## 2021-08-09 DIAGNOSIS — M6281 Muscle weakness (generalized): Secondary | ICD-10-CM | POA: Diagnosis not present

## 2021-08-09 DIAGNOSIS — Z9181 History of falling: Secondary | ICD-10-CM | POA: Diagnosis not present

## 2021-08-11 DIAGNOSIS — I1310 Hypertensive heart and chronic kidney disease without heart failure, with stage 1 through stage 4 chronic kidney disease, or unspecified chronic kidney disease: Secondary | ICD-10-CM | POA: Diagnosis not present

## 2021-08-11 DIAGNOSIS — R262 Difficulty in walking, not elsewhere classified: Secondary | ICD-10-CM | POA: Diagnosis not present

## 2021-08-11 DIAGNOSIS — Z9181 History of falling: Secondary | ICD-10-CM | POA: Diagnosis not present

## 2021-08-11 DIAGNOSIS — M4802 Spinal stenosis, cervical region: Secondary | ICD-10-CM | POA: Diagnosis not present

## 2021-08-11 DIAGNOSIS — M6281 Muscle weakness (generalized): Secondary | ICD-10-CM | POA: Diagnosis not present

## 2021-08-12 DIAGNOSIS — Z9181 History of falling: Secondary | ICD-10-CM | POA: Diagnosis not present

## 2021-08-12 DIAGNOSIS — I1310 Hypertensive heart and chronic kidney disease without heart failure, with stage 1 through stage 4 chronic kidney disease, or unspecified chronic kidney disease: Secondary | ICD-10-CM | POA: Diagnosis not present

## 2021-08-12 DIAGNOSIS — M4802 Spinal stenosis, cervical region: Secondary | ICD-10-CM | POA: Diagnosis not present

## 2021-08-12 DIAGNOSIS — M6281 Muscle weakness (generalized): Secondary | ICD-10-CM | POA: Diagnosis not present

## 2021-08-12 DIAGNOSIS — R262 Difficulty in walking, not elsewhere classified: Secondary | ICD-10-CM | POA: Diagnosis not present

## 2021-08-13 ENCOUNTER — Other Ambulatory Visit: Payer: Self-pay

## 2021-08-13 ENCOUNTER — Inpatient Hospital Stay (HOSPITAL_COMMUNITY): Payer: Medicare Other | Attending: Hematology

## 2021-08-13 DIAGNOSIS — Z79899 Other long term (current) drug therapy: Secondary | ICD-10-CM | POA: Insufficient documentation

## 2021-08-13 DIAGNOSIS — D631 Anemia in chronic kidney disease: Secondary | ICD-10-CM | POA: Diagnosis not present

## 2021-08-13 DIAGNOSIS — Z9181 History of falling: Secondary | ICD-10-CM | POA: Diagnosis not present

## 2021-08-13 DIAGNOSIS — N1832 Chronic kidney disease, stage 3b: Secondary | ICD-10-CM | POA: Insufficient documentation

## 2021-08-13 DIAGNOSIS — M6281 Muscle weakness (generalized): Secondary | ICD-10-CM | POA: Diagnosis not present

## 2021-08-13 DIAGNOSIS — N183 Chronic kidney disease, stage 3 unspecified: Secondary | ICD-10-CM

## 2021-08-13 DIAGNOSIS — Z7901 Long term (current) use of anticoagulants: Secondary | ICD-10-CM | POA: Insufficient documentation

## 2021-08-13 DIAGNOSIS — E538 Deficiency of other specified B group vitamins: Secondary | ICD-10-CM | POA: Diagnosis not present

## 2021-08-13 DIAGNOSIS — Z7982 Long term (current) use of aspirin: Secondary | ICD-10-CM | POA: Diagnosis not present

## 2021-08-13 DIAGNOSIS — M4802 Spinal stenosis, cervical region: Secondary | ICD-10-CM | POA: Diagnosis not present

## 2021-08-13 DIAGNOSIS — R262 Difficulty in walking, not elsewhere classified: Secondary | ICD-10-CM | POA: Diagnosis not present

## 2021-08-13 DIAGNOSIS — Z86718 Personal history of other venous thrombosis and embolism: Secondary | ICD-10-CM | POA: Diagnosis not present

## 2021-08-13 DIAGNOSIS — Z87891 Personal history of nicotine dependence: Secondary | ICD-10-CM | POA: Diagnosis not present

## 2021-08-13 DIAGNOSIS — I1310 Hypertensive heart and chronic kidney disease without heart failure, with stage 1 through stage 4 chronic kidney disease, or unspecified chronic kidney disease: Secondary | ICD-10-CM | POA: Diagnosis not present

## 2021-08-13 LAB — CBC WITH DIFFERENTIAL/PLATELET
Abs Immature Granulocytes: 0.02 10*3/uL (ref 0.00–0.07)
Basophils Absolute: 0 10*3/uL (ref 0.0–0.1)
Basophils Relative: 1 %
Eosinophils Absolute: 0.6 10*3/uL — ABNORMAL HIGH (ref 0.0–0.5)
Eosinophils Relative: 8 %
HCT: 37.4 % (ref 36.0–46.0)
Hemoglobin: 11.6 g/dL — ABNORMAL LOW (ref 12.0–15.0)
Immature Granulocytes: 0 %
Lymphocytes Relative: 29 %
Lymphs Abs: 2.2 10*3/uL (ref 0.7–4.0)
MCH: 29.6 pg (ref 26.0–34.0)
MCHC: 31 g/dL (ref 30.0–36.0)
MCV: 95.4 fL (ref 80.0–100.0)
Monocytes Absolute: 0.5 10*3/uL (ref 0.1–1.0)
Monocytes Relative: 6 %
Neutro Abs: 4.4 10*3/uL (ref 1.7–7.7)
Neutrophils Relative %: 56 %
Platelets: 219 10*3/uL (ref 150–400)
RBC: 3.92 MIL/uL (ref 3.87–5.11)
RDW: 14.3 % (ref 11.5–15.5)
WBC: 7.7 10*3/uL (ref 4.0–10.5)
nRBC: 0 % (ref 0.0–0.2)

## 2021-08-13 LAB — COMPREHENSIVE METABOLIC PANEL
ALT: 9 U/L (ref 0–44)
AST: 8 U/L — ABNORMAL LOW (ref 15–41)
Albumin: 4.2 g/dL (ref 3.5–5.0)
Alkaline Phosphatase: 77 U/L (ref 38–126)
Anion gap: 5 (ref 5–15)
BUN: 21 mg/dL (ref 8–23)
CO2: 26 mmol/L (ref 22–32)
Calcium: 9.2 mg/dL (ref 8.9–10.3)
Chloride: 105 mmol/L (ref 98–111)
Creatinine, Ser: 1.29 mg/dL — ABNORMAL HIGH (ref 0.44–1.00)
GFR, Estimated: 43 mL/min — ABNORMAL LOW (ref >60)
Glucose, Bld: 132 mg/dL — ABNORMAL HIGH (ref 70–99)
Potassium: 4.4 mmol/L (ref 3.5–5.1)
Sodium: 136 mmol/L (ref 135–145)
Total Bilirubin: 0.5 mg/dL (ref 0.3–1.2)
Total Protein: 7.5 g/dL (ref 6.5–8.1)

## 2021-08-13 LAB — IRON AND TIBC
Iron: 52 ug/dL (ref 28–170)
Saturation Ratios: 25 % (ref 10.4–31.8)
TIBC: 209 ug/dL — ABNORMAL LOW (ref 250–450)
UIBC: 157 ug/dL

## 2021-08-13 LAB — FERRITIN: Ferritin: 307 ng/mL (ref 11–307)

## 2021-08-13 LAB — FOLATE: Folate: >100 ng/mL (ref >5.9)

## 2021-08-13 LAB — VITAMIN B12: Vitamin B-12: 728 pg/mL (ref 180–914)

## 2021-08-14 DIAGNOSIS — I1310 Hypertensive heart and chronic kidney disease without heart failure, with stage 1 through stage 4 chronic kidney disease, or unspecified chronic kidney disease: Secondary | ICD-10-CM | POA: Diagnosis not present

## 2021-08-14 DIAGNOSIS — Z9181 History of falling: Secondary | ICD-10-CM | POA: Diagnosis not present

## 2021-08-14 DIAGNOSIS — M6281 Muscle weakness (generalized): Secondary | ICD-10-CM | POA: Diagnosis not present

## 2021-08-14 DIAGNOSIS — I82402 Acute embolism and thrombosis of unspecified deep veins of left lower extremity: Secondary | ICD-10-CM | POA: Diagnosis not present

## 2021-08-14 DIAGNOSIS — M4802 Spinal stenosis, cervical region: Secondary | ICD-10-CM | POA: Diagnosis not present

## 2021-08-14 DIAGNOSIS — Z1159 Encounter for screening for other viral diseases: Secondary | ICD-10-CM | POA: Diagnosis not present

## 2021-08-14 DIAGNOSIS — R262 Difficulty in walking, not elsewhere classified: Secondary | ICD-10-CM | POA: Diagnosis not present

## 2021-08-16 DIAGNOSIS — R262 Difficulty in walking, not elsewhere classified: Secondary | ICD-10-CM | POA: Diagnosis not present

## 2021-08-16 DIAGNOSIS — I1310 Hypertensive heart and chronic kidney disease without heart failure, with stage 1 through stage 4 chronic kidney disease, or unspecified chronic kidney disease: Secondary | ICD-10-CM | POA: Diagnosis not present

## 2021-08-16 DIAGNOSIS — Z9181 History of falling: Secondary | ICD-10-CM | POA: Diagnosis not present

## 2021-08-16 DIAGNOSIS — M4802 Spinal stenosis, cervical region: Secondary | ICD-10-CM | POA: Diagnosis not present

## 2021-08-16 DIAGNOSIS — M6281 Muscle weakness (generalized): Secondary | ICD-10-CM | POA: Diagnosis not present

## 2021-08-17 LAB — METHYLMALONIC ACID, SERUM: Methylmalonic Acid, Quantitative: 175 nmol/L (ref 0–378)

## 2021-08-18 DIAGNOSIS — M6281 Muscle weakness (generalized): Secondary | ICD-10-CM | POA: Diagnosis not present

## 2021-08-18 DIAGNOSIS — I1310 Hypertensive heart and chronic kidney disease without heart failure, with stage 1 through stage 4 chronic kidney disease, or unspecified chronic kidney disease: Secondary | ICD-10-CM | POA: Diagnosis not present

## 2021-08-18 DIAGNOSIS — Z9181 History of falling: Secondary | ICD-10-CM | POA: Diagnosis not present

## 2021-08-18 DIAGNOSIS — R262 Difficulty in walking, not elsewhere classified: Secondary | ICD-10-CM | POA: Diagnosis not present

## 2021-08-18 DIAGNOSIS — M4802 Spinal stenosis, cervical region: Secondary | ICD-10-CM | POA: Diagnosis not present

## 2021-08-19 NOTE — Progress Notes (Signed)
[address], [place] [address]   CLINIC:  Medical Oncology/Hematology  PCP:  Gerlene Fee, NP [place] [address] [phone]   REASON FOR VISIT:  Follow-up for anemia due to CKD stage IIIb  PRIOR THERAPY: Epogen, Retacrit  CURRENT THERAPY: Observation  INTERVAL HISTORY:  [name] 76 y.o. female returns for routine follow-up of anemia.  She was last seen by Tarri Abernethy PA-C on 05/20/2021.  At today's visit, she reports feeling fair.  No recent hospitalizations, surgeries, or changes in baseline health status.  She has been more fatigued over the past 1 to 2 months, which she thinks may be because she did not receive her B12 injection in January.  She is feeling a bit better after receiving her B12 injection this month.  She denies any hematemesis, hematochezia, melena, or epistaxis.  No pica, restless legs, or headaches.  No chest pain, dyspnea on exertion, lightheadedness, or syncope.  She denies any unilateral leg swelling, pain, and erythema.  No shortness of breath, dyspnea on exertion, chest pain, cough, hemoptysis, and palpitations.  She has 50% energy and 70% appetite. She endorses that she is maintaining a stable weight.   REVIEW OF SYSTEMS:  Review of Systems  Constitutional:  Positive for fatigue. Negative for appetite change, chills, diaphoresis, fever and unexpected weight change.  HENT:   Negative for lump/mass and nosebleeds.   Eyes:  Negative for eye problems.  Respiratory:  Negative for cough, hemoptysis and shortness of breath.   Cardiovascular:  Negative for chest pain, leg swelling and palpitations.  Gastrointestinal:  Positive for diarrhea and vomiting. Negative for abdominal pain, blood in stool, constipation and nausea.  Genitourinary:  Negative for hematuria.   Skin: Negative.   Neurological:  Positive for numbness. Negative for dizziness, headaches and light-headedness.   Hematological:  Does not bruise/bleed easily.     PAST MEDICAL/SURGICAL HISTORY:  Past Medical History:  Diagnosis Date   Allergy    Anemia    pernicious anemia   Arthritis    Asthma    Blood transfusion without reported diagnosis    CKD (chronic kidney disease)    COPD (chronic obstructive pulmonary disease) ([place]) 12/23/2019   Diabetic peripheral neuropathy associated with type 2 diabetes mellitus ([place]) 04/07/2021   Gastritis    GERD (gastroesophageal reflux disease)    Glaucoma    History of blood in urine    Hyperlipidemia    Hypertension    Neuropathy    both feet   Ocular hypertension    Osteoporosis    Stroke ([place])    Type 2 diabetes mellitus ([place]) 12/23/2019   Vitamin B12 deficiency anemia due to intrinsic factor deficiency    Past Surgical History:  Procedure Laterality Date   CHOLECYSTECTOMY  2008   COLONOSCOPY  04/30/2011   Procedure: COLONOSCOPY;  Surgeon: Rogene Houston, MD;  [facility];  Service: Endoscopy;  Laterality: N/A;   COLONOSCOPY  05/25/2012   Procedure: COLONOSCOPY;  Surgeon: Rogene Houston, MD;  [facility];  Service: Endoscopy;  Laterality: N/A;  1:25-changed to 1200 Ann to notify pt   COLONOSCOPY Bilateral 12/2017   COLONOSCOPY WITH PROPOFOL N/A 11/29/2020   Procedure: COLONOSCOPY WITH PROPOFOL;  Surgeon: Harvel Quale, MD;  [facility];  Service: Gastroenterology;  Laterality: N/A;  1:35 [facility] is in the [facility]   ESOPHAGOGASTRODUODENOSCOPY N/A 12/07/2019   Procedure: ESOPHAGOGASTRODUODENOSCOPY (EGD);  Surgeon: Hildred Laser  U, MD;  [facility];  Service: Endoscopy;  Laterality: N/A;  155   GALLBLADDER SURGERY     right breast cystectomy  1988   TUBAL LIGATION  1975     SOCIAL HISTORY:  Social History   Socioeconomic History   Marital status: Divorced    Spouse name: Not on file   Number of children: 4   Years of education: 9   Highest education level: Not on file   Occupational History   Occupation: retired   Tobacco Use   Smoking status: Former    Packs/day: 1.00    Years: 40.00    Pack years: 40.00    Types: Cigarettes    Quit date: 04/03/2011    Years since quitting: 10.3   Smokeless tobacco: Never  Vaping Use   Vaping Use: Never used  Substance and Sexual Activity   Alcohol use: No   Drug use: No   Sexual activity: Not Currently  Other Topics Concern   Not on file  Social History Narrative   01/17/20 lives at [facility], [place]   Social Determinants of Health   Financial Resource Strain: Not on file  Food Insecurity: Not on file  Transportation Needs: Not on file  Physical Activity: Not on file  Stress: Not on file  Social Connections: Not on file  Intimate Partner Violence: Not on file    FAMILY HISTORY:  Family History  Problem Relation Age of Onset   Diabetes Mother    Hyperlipidemia Mother    Hypertension Mother    Heart disease Father    Hyperlipidemia Father    Diabetes Sister    Hyperlipidemia Sister    Hypertension Sister    Cancer Brother    Heart disease Brother    Hyperlipidemia Brother    Hypertension Brother    Diabetes Sister     CURRENT MEDICATIONS:  Outpatient Encounter Medications as of 08/20/2021  Medication Sig   acetaminophen (TYLENOL) 325 MG tablet Take 2 tablets (650 mg total) by mouth every 6 (six) hours as needed for mild pain (or Fever >/= 101).   albuterol (VENTOLIN HFA) 108 (90 Base) MCG/ACT inhaler Inhale 2 puffs into the lungs every 4 (four) hours as needed for wheezing or shortness of breath.   amLODipine (NORVASC) 10 MG tablet Take 1 tablet (10 mg total) by mouth daily.   aspirin EC 81 MG tablet Take 81 mg by mouth daily. Swallow whole.   Balsam [place]-Castor Oil (VENELEX) OINT Apply 1 application topically as needed. Apply to sacrum and bilateral buttocks Every Shift - PRN; PRN 1, PRN 2, PRN 3   benzocaine-menthol (SORE THROAT) 6-10 MG lozenge Take 1 lozenge by mouth as needed for  sore throat.   cyanocobalamin (,VITAMIN B-12,) 1000 MCG/ML injection Inject 1 mL (1,000 mcg total) into the muscle every 30 (thirty) days.   docusate sodium (COLACE) 100 MG capsule Take 1 capsule (100 mg total) by mouth 2 (two) times daily.   dorzolamide (TRUSOPT) 2 % ophthalmic solution Place 1 drop into both eyes 2 (two) times daily. (0900 & 1700)   ergocalciferol (VITAMIN D2) 1.25 MG (50000 UT) capsule Take 50,000 Units by mouth once a week. Thursday   folic acid (FOLVITE) 1 MG tablet Take 1 mg by mouth daily. (0900)   gabapentin (NEURONTIN) 100 MG capsule Take 100 mg by mouth at bedtime. (2100)   Iron, Ferrous Sulfate, 325 (65 Fe) MG TABS Take by mouth. amt: 1 tab; oral,Once A Day  latanoprost (XALATAN) 0.005 % ophthalmic solution Place 1 drop into both eyes at bedtime. (2100)   loperamide (IMODIUM A-D) 2 MG tablet Take 2 mg by mouth every 4 (four) hours as needed for diarrhea or loose stools.   losartan (COZAAR) 25 MG tablet Take 25 mg by mouth daily.   metoCLOPramide (REGLAN) 5 MG tablet Take 1 tablet (5 mg total) by mouth 3 (three) times daily before meals.   NON FORMULARY Diet:Regular thin liquids  ALLERGIC TO FISH   omeprazole (PRILOSEC) 40 MG capsule Take 40 mg by mouth daily at 6 (six) AM. (0600)   ondansetron (ZOFRAN) 4 MG tablet Take 4 mg by mouth every 6 (six) hours as needed for nausea or vomiting.   rosuvastatin (CRESTOR) 5 MG tablet Take 5 mg by mouth. Once A Day on Mon, Wed, Fri   Wound Dressings (ALLEVYN LIFE HEEL) PADS Apply 1 patch topically as directed. Every 5 days and as needed for prevention   No facility-administered encounter medications on file as of 08/20/2021.    ALLERGIES:  Allergies  Allergen Reactions   Latex Rash   Levofloxacin Nausea And Vomiting   Neomycin-Bacitracin-Polymyxin  [Bacitracin-Neomycin-Polymyxin] Rash   Other Swelling and Rash    TOMATO  SOUP-PER MAR   Prednisone Hives, Itching, Rash and Other (See Comments)   Fish Allergy     rash    Shellfish Allergy Hives   Neosporin [Neomycin-Polymyxin-Gramicidin] Itching and Rash     PHYSICAL EXAM:  ECOG PERFORMANCE STATUS: 2 - Symptomatic, <50% confined to bed  There were no vitals filed for this visit. There were no vitals filed for this visit. Physical Exam Constitutional:      Appearance: Normal appearance.     Comments: Presents in wheelchair  HENT:     Head: Normocephalic and atraumatic.     Mouth/Throat:     Mouth: Mucous membranes are moist.  Eyes:     Extraocular Movements: Extraocular movements intact.     Pupils: Pupils are equal, round, and reactive to light.  Cardiovascular:     Rate and Rhythm: Normal rate and regular rhythm.     Pulses: Normal pulses.     Heart sounds: Normal heart sounds.  Pulmonary:     Effort: Pulmonary effort is normal.     Breath sounds: Normal breath sounds.  Abdominal:     General: Bowel sounds are normal.     Palpations: Abdomen is soft.     Tenderness: There is no abdominal tenderness.  Musculoskeletal:        General: No swelling.     Right lower leg: No edema.     Left lower leg: No edema.  Lymphadenopathy:     Cervical: No cervical adenopathy.  Skin:    General: Skin is warm and dry.  Neurological:     General: No focal deficit present.     Mental Status: She is alert and oriented to person, place, and time.  Psychiatric:        Mood and Affect: Mood normal.        Behavior: Behavior normal.     LABORATORY DATA:  I have reviewed the labs as listed.  CBC    Component Value Date/Time   WBC 7.7 08/13/2021 1059   RBC 3.92 08/13/2021 1059   HGB 11.6 (L) 08/13/2021 1059   HCT 37.4 08/13/2021 1059   PLT 219 08/13/2021 1059   MCV 95.4 08/13/2021 1059   MCH 29.6 08/13/2021 1059   MCHC 31.0 08/13/2021 1059  RDW 14.3 08/13/2021 1059   LYMPHSABS 2.2 08/13/2021 1059   MONOABS 0.5 08/13/2021 1059   EOSABS 0.6 (H) 08/13/2021 1059   BASOSABS 0.0 08/13/2021 1059   CMP Latest Ref Rng & Units 08/13/2021 06/25/2021  06/10/2021  Glucose 70 - 99 mg/dL 132(H) 82 113(H)  BUN 8 - 23 mg/dL 21 31(H) 27(H)  Creatinine 0.44 - 1.00 mg/dL 1.29(H) 1.26(H) 1.36(H)  Sodium 135 - 145 mmol/L 136 138 137  Potassium 3.5 - 5.1 mmol/L 4.4 4.6 4.3  Chloride 98 - 111 mmol/L 105 105 103  CO2 22 - 32 mmol/L 26 24 27   Calcium 8.9 - 10.3 mg/dL 9.2 9.1 9.2  Total Protein 6.5 - 8.1 g/dL 7.5 - -  Total Bilirubin 0.3 - 1.2 mg/dL 0.5 - -  Alkaline Phos 38 - 126 U/L 77 - -  AST 15 - 41 U/L 8(L) - -  ALT 0 - 44 U/L 9 - -    DIAGNOSTIC IMAGING:  I have independently reviewed the relevant imaging and discussed with the patient.  ASSESSMENT & PLAN: 1.  Normocytic anemia, secondary to CKD and relative iron deficiency - Etiology is from combination of CKD and relative iron deficiency. - Initial work-up included SPEP which was negative.  Stool was negative for occult blood.  Hemoglobin electrophoresis showed severe microcytosis related to her anemia.  She could have underlying thalassemia/sickle cell trait. - Last colonoscopy was in June 2019 in [place], 3 polyps removed. - She has a history of needing a blood transfusion in the early 2000's. - She denies any bright red blood per rectum or melena    - We took over Epogen injections for Dr. Theador Hawthorne, but patient has not required Retacrit for the past 6 months due to Hgb > 11.0 (last Retacrit was 02/11/2021), therefore Retacrit protocol discontinued. - She was started on oral ferrous sulfate at last visit, but reported significant constipation, which was relieved by Colace    - Most recent iron panel (08/13/2021): Ferritin 307, iron saturation 25%. - Most recent CBC (08/13/2021): Hgb 11.6/MCV 95.4 - PLAN: No indication for IV iron or ESA at this time. - Continue oral iron supplementation with Colace twice daily. -  RTC in 3 months for repeat labs and office visit.     2.  CKD stage IIIb -Ultrasound the kidneys on 08/22/2019 showed bilateral renal atrophy, severe atrophy of the right  kidney. - PLAN: Continue follow-up with Dr. Theador Hawthorne   3.  V95 and folic acid deficiency: - She is on B12 injections monthly at SNF - She takes daily folic acid supplement - Labs (08/13/2021) showed normal B12, folate, and methylmalonic acid - PLAN: Continue monthly B12 injections and daily folic acid supplement at SNF   4.  History of lower extremity DVT - Venous duplex (02/02/2020): Extensive acute appearing left femoropopliteal occlusive DVT extending into the calf veins. - Considered provoked in the setting of immobility/bedbound functional status - Eliquis was discontinued by her PCP, placed on aspirin and statin - No current signs or symptoms of recurrent DVT    PLAN SUMMARY & DISPOSITION: Labs and RTC in 3 months  All questions were answered. The patient knows to call the clinic with any problems, questions or concerns.  Medical decision making: Moderate  Time spent on visit: I spent 20 minutes counseling the patient face to face. The total time spent in the appointment was 30 minutes and more than 50% was on counseling.   Harriett Rush, PA-C  08/20/2021 11:03 AM

## 2021-08-20 ENCOUNTER — Inpatient Hospital Stay (HOSPITAL_BASED_OUTPATIENT_CLINIC_OR_DEPARTMENT_OTHER): Payer: Medicare Other | Admitting: Physician Assistant

## 2021-08-20 VITALS — BP 129/70 | HR 71 | Temp 98.9°F | Resp 16

## 2021-08-20 DIAGNOSIS — D631 Anemia in chronic kidney disease: Secondary | ICD-10-CM

## 2021-08-20 DIAGNOSIS — R262 Difficulty in walking, not elsewhere classified: Secondary | ICD-10-CM | POA: Diagnosis not present

## 2021-08-20 DIAGNOSIS — N189 Chronic kidney disease, unspecified: Secondary | ICD-10-CM

## 2021-08-20 DIAGNOSIS — E538 Deficiency of other specified B group vitamins: Secondary | ICD-10-CM

## 2021-08-20 DIAGNOSIS — M4802 Spinal stenosis, cervical region: Secondary | ICD-10-CM | POA: Diagnosis not present

## 2021-08-20 DIAGNOSIS — Z7901 Long term (current) use of anticoagulants: Secondary | ICD-10-CM | POA: Diagnosis not present

## 2021-08-20 DIAGNOSIS — N1832 Chronic kidney disease, stage 3b: Secondary | ICD-10-CM | POA: Diagnosis not present

## 2021-08-20 DIAGNOSIS — I1310 Hypertensive heart and chronic kidney disease without heart failure, with stage 1 through stage 4 chronic kidney disease, or unspecified chronic kidney disease: Secondary | ICD-10-CM | POA: Diagnosis not present

## 2021-08-20 DIAGNOSIS — Z87891 Personal history of nicotine dependence: Secondary | ICD-10-CM | POA: Diagnosis not present

## 2021-08-20 DIAGNOSIS — Z9181 History of falling: Secondary | ICD-10-CM | POA: Diagnosis not present

## 2021-08-20 DIAGNOSIS — Z7982 Long term (current) use of aspirin: Secondary | ICD-10-CM | POA: Diagnosis not present

## 2021-08-20 DIAGNOSIS — Z79899 Other long term (current) drug therapy: Secondary | ICD-10-CM | POA: Diagnosis not present

## 2021-08-20 DIAGNOSIS — Z86718 Personal history of other venous thrombosis and embolism: Secondary | ICD-10-CM | POA: Diagnosis not present

## 2021-08-20 DIAGNOSIS — M6281 Muscle weakness (generalized): Secondary | ICD-10-CM | POA: Diagnosis not present

## 2021-08-20 NOTE — Patient Instructions (Signed)
[place] at [facility] Discharge Instructions  You were seen today by Tarri Abernethy PA-C for your history of anemia.  Your blood counts looks great today!  We will check your blood and see you back for another visit in 3 months.  LABS: Return in 3 months for repeat labs  MEDICATIONS: - Continue monthly B12 injections - Continue folic acid and iron supplements  FOLLOW-UP APPOINTMENT: Office visit in 3 months   Thank you for choosing [place] at [facility] to provide your oncology and hematology care.  To afford each patient quality time with our provider, please arrive at least 15 minutes before your scheduled appointment time.   If you have a lab appointment with the [place] please come in thru the Main Entrance and check in at the main information desk.  You need to re-schedule your appointment should you arrive 10 or more minutes late.  We strive to give you quality time with our providers, and arriving late affects you and other patients whose appointments are after yours.  Also, if you no show three or more times for appointments you may be dismissed from the clinic at the providers discretion.     Again, thank you for choosing [facility].  Our hope is that these requests will decrease the amount of time that you wait before being seen by our physicians.       _____________________________________________________________  Should you have questions after your visit to [facility], please contact our office at [phone] and follow the prompts.  Our office hours are 8:00 a.m. and 4:30 p.m. Monday - Friday.  Please note that voicemails left after 4:00 p.m. may not be returned until the following business day.  We are closed weekends and major holidays.  You do have access to a nurse 24-7, just call the main number to the clinic [phone] and do not press any options, hold on the line and a nurse  will answer the phone.    For prescription refill requests, have your pharmacy contact our office and allow 72 hours.    Due to Covid, you will need to wear a mask upon entering the hospital. If you do not have a mask, a mask will be given to you at the Main Entrance upon arrival. For doctor visits, patients may have 1 support person age 7 or older with them. For treatment visits, patients can not have anyone with them due to social distancing guidelines and our immunocompromised population.

## 2021-08-21 DIAGNOSIS — M4802 Spinal stenosis, cervical region: Secondary | ICD-10-CM | POA: Diagnosis not present

## 2021-08-21 DIAGNOSIS — Z9181 History of falling: Secondary | ICD-10-CM | POA: Diagnosis not present

## 2021-08-21 DIAGNOSIS — M6281 Muscle weakness (generalized): Secondary | ICD-10-CM | POA: Diagnosis not present

## 2021-08-21 DIAGNOSIS — I1310 Hypertensive heart and chronic kidney disease without heart failure, with stage 1 through stage 4 chronic kidney disease, or unspecified chronic kidney disease: Secondary | ICD-10-CM | POA: Diagnosis not present

## 2021-08-21 DIAGNOSIS — Z1159 Encounter for screening for other viral diseases: Secondary | ICD-10-CM | POA: Diagnosis not present

## 2021-08-21 DIAGNOSIS — R262 Difficulty in walking, not elsewhere classified: Secondary | ICD-10-CM | POA: Diagnosis not present

## 2021-08-21 DIAGNOSIS — I82402 Acute embolism and thrombosis of unspecified deep veins of left lower extremity: Secondary | ICD-10-CM | POA: Diagnosis not present

## 2021-08-22 DIAGNOSIS — Z9181 History of falling: Secondary | ICD-10-CM | POA: Diagnosis not present

## 2021-08-22 DIAGNOSIS — M6281 Muscle weakness (generalized): Secondary | ICD-10-CM | POA: Diagnosis not present

## 2021-08-22 DIAGNOSIS — R262 Difficulty in walking, not elsewhere classified: Secondary | ICD-10-CM | POA: Diagnosis not present

## 2021-08-22 DIAGNOSIS — I1310 Hypertensive heart and chronic kidney disease without heart failure, with stage 1 through stage 4 chronic kidney disease, or unspecified chronic kidney disease: Secondary | ICD-10-CM | POA: Diagnosis not present

## 2021-08-22 DIAGNOSIS — M4802 Spinal stenosis, cervical region: Secondary | ICD-10-CM | POA: Diagnosis not present

## 2021-08-23 DIAGNOSIS — I1310 Hypertensive heart and chronic kidney disease without heart failure, with stage 1 through stage 4 chronic kidney disease, or unspecified chronic kidney disease: Secondary | ICD-10-CM | POA: Diagnosis not present

## 2021-08-23 DIAGNOSIS — R262 Difficulty in walking, not elsewhere classified: Secondary | ICD-10-CM | POA: Diagnosis not present

## 2021-08-23 DIAGNOSIS — M6281 Muscle weakness (generalized): Secondary | ICD-10-CM | POA: Diagnosis not present

## 2021-08-23 DIAGNOSIS — M4802 Spinal stenosis, cervical region: Secondary | ICD-10-CM | POA: Diagnosis not present

## 2021-08-23 DIAGNOSIS — Z9181 History of falling: Secondary | ICD-10-CM | POA: Diagnosis not present

## 2021-08-25 DIAGNOSIS — M6281 Muscle weakness (generalized): Secondary | ICD-10-CM | POA: Diagnosis not present

## 2021-08-25 DIAGNOSIS — Z9181 History of falling: Secondary | ICD-10-CM | POA: Diagnosis not present

## 2021-08-25 DIAGNOSIS — I1310 Hypertensive heart and chronic kidney disease without heart failure, with stage 1 through stage 4 chronic kidney disease, or unspecified chronic kidney disease: Secondary | ICD-10-CM | POA: Diagnosis not present

## 2021-08-25 DIAGNOSIS — R262 Difficulty in walking, not elsewhere classified: Secondary | ICD-10-CM | POA: Diagnosis not present

## 2021-08-25 DIAGNOSIS — M4802 Spinal stenosis, cervical region: Secondary | ICD-10-CM | POA: Diagnosis not present

## 2021-08-26 ENCOUNTER — Non-Acute Institutional Stay (SKILLED_NURSING_FACILITY): Payer: Medicare Other | Admitting: Internal Medicine

## 2021-08-26 ENCOUNTER — Encounter: Payer: Self-pay | Admitting: Internal Medicine

## 2021-08-26 DIAGNOSIS — E538 Deficiency of other specified B group vitamins: Secondary | ICD-10-CM | POA: Diagnosis not present

## 2021-08-26 DIAGNOSIS — R262 Difficulty in walking, not elsewhere classified: Secondary | ICD-10-CM | POA: Diagnosis not present

## 2021-08-26 DIAGNOSIS — J449 Chronic obstructive pulmonary disease, unspecified: Secondary | ICD-10-CM

## 2021-08-26 DIAGNOSIS — N183 Chronic kidney disease, stage 3 unspecified: Secondary | ICD-10-CM | POA: Diagnosis not present

## 2021-08-26 DIAGNOSIS — I700 Atherosclerosis of aorta: Secondary | ICD-10-CM | POA: Diagnosis not present

## 2021-08-26 DIAGNOSIS — E1122 Type 2 diabetes mellitus with diabetic chronic kidney disease: Secondary | ICD-10-CM | POA: Diagnosis not present

## 2021-08-26 DIAGNOSIS — Z9181 History of falling: Secondary | ICD-10-CM | POA: Diagnosis not present

## 2021-08-26 DIAGNOSIS — I129 Hypertensive chronic kidney disease with stage 1 through stage 4 chronic kidney disease, or unspecified chronic kidney disease: Secondary | ICD-10-CM

## 2021-08-26 DIAGNOSIS — E440 Moderate protein-calorie malnutrition: Secondary | ICD-10-CM | POA: Diagnosis not present

## 2021-08-26 DIAGNOSIS — M4802 Spinal stenosis, cervical region: Secondary | ICD-10-CM | POA: Diagnosis not present

## 2021-08-26 DIAGNOSIS — M6281 Muscle weakness (generalized): Secondary | ICD-10-CM | POA: Diagnosis not present

## 2021-08-26 DIAGNOSIS — I1310 Hypertensive heart and chronic kidney disease without heart failure, with stage 1 through stage 4 chronic kidney disease, or unspecified chronic kidney disease: Secondary | ICD-10-CM | POA: Diagnosis not present

## 2021-08-26 NOTE — Assessment & Plan Note (Addendum)
Current albumin 4.2 and total protein 7.5 indicating resolution of the protein/caloric malnutrition.  She is thin and limbs are atrophic.  TSH can be updated with next blood draw.  It was therapeutic in February 2022.

## 2021-08-26 NOTE — Assessment & Plan Note (Addendum)
Creatinine stable at 1.29 with a GFR 43 indicating CKD stage IIIb.  No change in antihypertensive medications indicated.

## 2021-08-26 NOTE — Progress Notes (Signed)
NURSING HOME LOCATION: [facility] ROOM NUMBER: 150 P  CODE STATUS: DNR  PCP: Ok Edwards, NP  This is a nursing facility follow up visit of chronic medical diagnoses & to document compliance with Regulation 483.30 (c) in [employer] [place] Manual Phase 2 which mandates caregiver visit ( visits can alternate among physician, PA or NP as per statutes) within 10 days of 30 days / 60 days/ 90 days post admission to SNF date    Interim medical record and care since last SNF visit was updated with review of diagnostic studies and change in clinical status since last visit were documented.  HPI: She is a permanent resident of the facility with medical diagnoses of pernicious anemia, history of asthma, COPD, diabetes with CKD and peripheral neuropathy, GERD with history of gastritis, dyslipidemia, essential hypertension, history of stroke, and osteoporosis.  Labs were completed 08/13/2021.  Creatinine was essentially stable at 1.29 with a GFR of 43 indicating CKD stage IIIb.  Albumin was normal at 4.2 and total protein normal at 7.5.  Chronic anemia was improved with H/H of 11.6/37.4 with normochromic/normocytic indices.  B12 level was therapeutic at 728 and iron normal at 52.  Folic acid level was greater than 100.A1c was 5.3 % on 05/01/2021, non diabetic. TSH was last completed 09/05/2020 and was therapeutic at 2.291.  Review of systems: She is an excellent historian.  She describes chronic neuropathy in her hands and both feet which she feels is "progressing with age".  She validates that her stool is dark on iron.  She describes being told that she snores but no apnea has been observed.  She has had cold intolerance for 2 years and she attributes this to "2 kinds of anemia".  Constitutional: No fever, significant weight change  Eyes: No redness, discharge, pain, vision change ENT/mouth: No nasal congestion,  purulent discharge, earache, change in hearing, sore throat   Cardiovascular: No chest pain, palpitations, paroxysmal nocturnal dyspnea, claudication, edema  Respiratory: No cough, sputum production, hemoptysis, DOE Gastrointestinal: No heartburn, dysphagia, abdominal pain, nausea /vomiting, rectal bleeding, change in bowels Genitourinary: No dysuria, hematuria, pyuria, incontinence, nocturia Musculoskeletal: No joint stiffness, joint swelling, weakness, pain Dermatologic: No rash, pruritus, change in appearance of skin Neurologic: No dizziness, headache, syncope, seizures Psychiatric: No significant anxiety, depression, insomnia, anorexia Endocrine: No change in hair/skin/nails, excessive thirst, excessive hunger, excessive urination  Hematologic/lymphatic: No significant bruising, lymphadenopathy, abnormal bleeding Allergy/immunology: No itchy/watery eyes, significant sneezing, urticaria, angioedema  Physical exam:  Pertinent or positive findings: See is thin with atrophic limbs.  She was sitting beside the bedside wearing a wool jacket with a blanket over her lap as well as mittens and booties.  It is because of her attire in the warm room that I asked about cold intolerance and she elaborated as noted.  Left nasolabial fold is slightly decreased compared to the right.  She is edentulous.  Second heart sound is increased.  Deep tendon reflexes are 1/2+ and equal.  General appearance: no acute distress, increased work of breathing is present.   Lymphatic: No lymphadenopathy about the head, neck, axilla. Eyes: No conjunctival inflammation or lid edema is present. There is no scleral icterus. Ears:  External ear exam shows no significant lesions or deformities.   Nose:  External nasal examination shows no deformity or inflammation. Nasal mucosa are pink and moist without lesions, exudates Oral exam:  Lips and gums are healthy appearing. There is no oropharyngeal erythema or exudate. Neck:  No thyromegaly, masses, tenderness noted.    Heart:  Normal rate  and regular rhythm. S1 normal without gallop, murmur, click, rub .  Lungs: Chest clear to auscultation without wheezes, rhonchi, rales, rubs. Abdomen: Bowel sounds are normal. Abdomen is soft and nontender with no organomegaly, hernias, masses. GU: Deferred  Extremities:  No cyanosis, clubbing, edema  Skin: Warm & dry w/o tenting. No significant lesions or rash.  See summary under each active problem in the Problem List with associated updated therapeutic plan

## 2021-08-26 NOTE — Assessment & Plan Note (Signed)
Chronic anemia improved with H/H of 11.6/37.4 with normochromic/normocytic indices.  B12 level therapeutic at 728 and iron normal at 52.  Folate level greater than 100.

## 2021-08-27 DIAGNOSIS — M4802 Spinal stenosis, cervical region: Secondary | ICD-10-CM | POA: Diagnosis not present

## 2021-08-27 DIAGNOSIS — I1310 Hypertensive heart and chronic kidney disease without heart failure, with stage 1 through stage 4 chronic kidney disease, or unspecified chronic kidney disease: Secondary | ICD-10-CM | POA: Diagnosis not present

## 2021-08-27 DIAGNOSIS — R262 Difficulty in walking, not elsewhere classified: Secondary | ICD-10-CM | POA: Diagnosis not present

## 2021-08-27 DIAGNOSIS — M6281 Muscle weakness (generalized): Secondary | ICD-10-CM | POA: Diagnosis not present

## 2021-08-27 DIAGNOSIS — Z9181 History of falling: Secondary | ICD-10-CM | POA: Diagnosis not present

## 2021-08-27 NOTE — Assessment & Plan Note (Signed)
CKD stable ; no change in ARB & Gabapentin unless CKD progressive.

## 2021-08-28 ENCOUNTER — Encounter: Payer: Self-pay | Admitting: Internal Medicine

## 2021-08-28 DIAGNOSIS — I82402 Acute embolism and thrombosis of unspecified deep veins of left lower extremity: Secondary | ICD-10-CM | POA: Diagnosis not present

## 2021-08-28 DIAGNOSIS — Z1159 Encounter for screening for other viral diseases: Secondary | ICD-10-CM | POA: Diagnosis not present

## 2021-08-28 DIAGNOSIS — I1310 Hypertensive heart and chronic kidney disease without heart failure, with stage 1 through stage 4 chronic kidney disease, or unspecified chronic kidney disease: Secondary | ICD-10-CM | POA: Diagnosis not present

## 2021-08-28 DIAGNOSIS — Z9181 History of falling: Secondary | ICD-10-CM | POA: Diagnosis not present

## 2021-08-28 DIAGNOSIS — M4802 Spinal stenosis, cervical region: Secondary | ICD-10-CM | POA: Diagnosis not present

## 2021-08-28 DIAGNOSIS — M6281 Muscle weakness (generalized): Secondary | ICD-10-CM | POA: Diagnosis not present

## 2021-08-28 DIAGNOSIS — R262 Difficulty in walking, not elsewhere classified: Secondary | ICD-10-CM | POA: Diagnosis not present

## 2021-08-28 NOTE — Assessment & Plan Note (Signed)
She has no active cardiopulmonary symptoms to suggest ongoing angina.

## 2021-08-28 NOTE — Assessment & Plan Note (Signed)
Chest is clear to auscultation and O2 sats were 93% on room air.  No change in pulmonary toilet indicated.

## 2021-08-28 NOTE — Patient Instructions (Signed)
See assessment and plan under each diagnosis in the problem list and acutely for this visit 

## 2021-08-29 ENCOUNTER — Encounter: Payer: Self-pay | Admitting: Adult Health

## 2021-08-29 ENCOUNTER — Non-Acute Institutional Stay (SKILLED_NURSING_FACILITY): Payer: Medicare Other | Admitting: Adult Health

## 2021-08-29 DIAGNOSIS — M6281 Muscle weakness (generalized): Secondary | ICD-10-CM | POA: Diagnosis not present

## 2021-08-29 DIAGNOSIS — Z9181 History of falling: Secondary | ICD-10-CM | POA: Diagnosis not present

## 2021-08-29 DIAGNOSIS — E1122 Type 2 diabetes mellitus with diabetic chronic kidney disease: Secondary | ICD-10-CM

## 2021-08-29 DIAGNOSIS — N183 Chronic kidney disease, stage 3 unspecified: Secondary | ICD-10-CM

## 2021-08-29 DIAGNOSIS — G959 Disease of spinal cord, unspecified: Secondary | ICD-10-CM

## 2021-08-29 DIAGNOSIS — R262 Difficulty in walking, not elsewhere classified: Secondary | ICD-10-CM | POA: Diagnosis not present

## 2021-08-29 DIAGNOSIS — I129 Hypertensive chronic kidney disease with stage 1 through stage 4 chronic kidney disease, or unspecified chronic kidney disease: Secondary | ICD-10-CM

## 2021-08-29 DIAGNOSIS — M4802 Spinal stenosis, cervical region: Secondary | ICD-10-CM | POA: Diagnosis not present

## 2021-08-29 DIAGNOSIS — E1142 Type 2 diabetes mellitus with diabetic polyneuropathy: Secondary | ICD-10-CM | POA: Diagnosis not present

## 2021-08-29 DIAGNOSIS — I1310 Hypertensive heart and chronic kidney disease without heart failure, with stage 1 through stage 4 chronic kidney disease, or unspecified chronic kidney disease: Secondary | ICD-10-CM | POA: Diagnosis not present

## 2021-08-29 NOTE — Progress Notes (Signed)
Location:  [facility] Room Number: 150-P Place of Service:  SNF (31)   CODE STATUS: DNR  Allergies  Allergen Reactions   Latex Rash   Levofloxacin Nausea And Vomiting   Neomycin-Bacitracin-Polymyxin  [Bacitracin-Neomycin-Polymyxin] Rash   Other Swelling and Rash    TOMATO  SOUP-PER MAR   Prednisone Hives, Itching, Rash and Other (See Comments)   Fish Allergy     rash   Shellfish Allergy Hives   Neosporin [Neomycin-Polymyxin-Gramicidin] Itching and Rash    Chief Complaint  Patient presents with   Acute Visit    Care plan meeting    HPI:  We have come together for her care plan meeting. BIMS 15/15 mood 0/30. She requires limited to extensive assist with her adls. She is incontinent lf bladder and bowel. There have been no falls. Dietary: on regular diet; good appetite; weight is 109 pounds up 2 pounds; feeds self. Therapy: no at this time. She continues to be followed for her chronic illnesses including:  Hypertension associated with stage 3 chronic kidney disease due to type 2 diabetes mellitus  Diabetic peripheral neuropathy associated with type 2 diabetes mellitus  Cervical myelopathy   Past Medical History:  Diagnosis Date   Allergy    Anemia    pernicious anemia   Arthritis    Asthma    Blood transfusion without reported diagnosis    CKD (chronic kidney disease)    COPD (chronic obstructive pulmonary disease) ([place]) 12/23/2019   Diabetic peripheral neuropathy associated with type 2 diabetes mellitus ([place]) 04/07/2021   Gastritis    GERD (gastroesophageal reflux disease)    Glaucoma    History of blood in urine    Hyperlipidemia    Hypertension    Neuropathy    both feet   Ocular hypertension    Osteoporosis    Stroke ([place])    Type 2 diabetes mellitus ([place]) 12/23/2019   Vitamin B12 deficiency anemia due to intrinsic factor deficiency     Past Surgical History:  Procedure Laterality Date   CHOLECYSTECTOMY  2008   COLONOSCOPY  04/30/2011    Procedure: COLONOSCOPY;  Surgeon: Rogene Houston, MD;  [facility];  Service: Endoscopy;  Laterality: N/A;   COLONOSCOPY  05/25/2012   Procedure: COLONOSCOPY;  Surgeon: Rogene Houston, MD;  [facility];  Service: Endoscopy;  Laterality: N/A;  1:25-changed to 1200 Ann to notify pt   COLONOSCOPY Bilateral 12/2017   COLONOSCOPY WITH PROPOFOL N/A 11/29/2020   Procedure: COLONOSCOPY WITH PROPOFOL;  Surgeon: Harvel Quale, MD;  Location: [facility];  Service: Gastroenterology;  Laterality: N/A;  1:35 patient is in the [facility]   ESOPHAGOGASTRODUODENOSCOPY N/A 12/07/2019   Procedure: ESOPHAGOGASTRODUODENOSCOPY (EGD);  Surgeon: Rogene Houston, MD;  [facility];  Service: Endoscopy;  Laterality: N/A;  155   GALLBLADDER SURGERY     right breast cystectomy  1988   TUBAL LIGATION  1975    Social History   Socioeconomic History   Marital status: Divorced    Spouse name: Not on file   Number of children: 4   Years of education: 9   Highest education level: Not on file  Occupational History   Occupation: retired   Tobacco Use   Smoking status: Former    Packs/day: 1.00    Years: 40.00    Pack years: 40.00    Types: Cigarettes    Quit date: 04/03/2011    Years since quitting: 56.4  Smokeless tobacco: Never  Vaping Use   Vaping Use: Never used  Substance and Sexual Activity   Alcohol use: No   Drug use: No   Sexual activity: Not Currently  Other Topics Concern   Not on file  Social History Narrative   01/[birth year] lives at [facility], [place]   Social Determinants of Health   Financial Resource Strain: Not on file  Food Insecurity: Not on file  Transportation Needs: Not on file  Physical Activity: Not on file  Stress: Not on file  Social Connections: Not on file  Intimate Partner Violence: Not on file   Family History  Problem Relation Age of Onset   Diabetes Mother    Hyperlipidemia Mother    Hypertension Mother     Heart disease Father    Hyperlipidemia Father    Diabetes Sister    Hyperlipidemia Sister    Hypertension Sister    Cancer Brother    Heart disease Brother    Hyperlipidemia Brother    Hypertension Brother    Diabetes Sister       VITAL SIGNS BP 135/74    Pulse 74    Temp (!) 96.8 F (36 C)    Resp 20    Ht 5\' 4"  (1.626 m)    Wt 109 lb (49.4 kg)    SpO2 93%    BMI 18.71 kg/m   Outpatient Encounter Medications as of 08/29/2021  Medication Sig   acetaminophen (TYLENOL) 325 MG tablet Take 2 tablets (650 mg total) by mouth every 6 (six) hours as needed for mild pain (or Fever >/= 101).   albuterol (VENTOLIN HFA) 108 (90 Base) MCG/ACT inhaler Inhale 2 puffs into the lungs every 4 (four) hours as needed for wheezing or shortness of breath.   amLODipine (NORVASC) 10 MG tablet Take 1 tablet (10 mg total) by mouth daily.   aspirin EC 81 MG tablet Take 81 mg by mouth daily. Swallow whole.   Balsam [place]-Castor Oil (VENELEX) OINT Apply 1 application topically as needed. Apply to sacrum and bilateral buttocks Every Shift - PRN; PRN 1, PRN 2, PRN 3   benzocaine-menthol (SORE THROAT) 6-10 MG lozenge Take 1 lozenge by mouth as needed for sore throat.   cyanocobalamin (,VITAMIN B-12,) 1000 MCG/ML injection Inject 1 mL (1,000 mcg total) into the muscle every 30 (thirty) days.   docusate sodium (COLACE) 100 MG capsule Take 1 capsule (100 mg total) by mouth 2 (two) times daily.   dorzolamide (TRUSOPT) 2 % ophthalmic solution Place 1 drop into both eyes 2 (two) times daily. (0900 & 1700)   ergocalciferol (VITAMIN D2) 1.25 MG (50000 UT) capsule Take 50,000 Units by mouth once a week. Thursday   folic acid (FOLVITE) 1 MG tablet Take 1 mg by mouth daily. (0900)   gabapentin (NEURONTIN) 100 MG capsule Take 100 mg by mouth at bedtime. (2100)   Iron, Ferrous Sulfate, 325 (65 Fe) MG TABS Take by mouth. amt: 1 tab; oral,Once A Day   latanoprost (XALATAN) 0.005 % ophthalmic solution Place 1 drop into both eyes  at bedtime. (2100)   loperamide (IMODIUM A-D) 2 MG tablet Take 2 mg by mouth every 4 (four) hours as needed for diarrhea or loose stools.   losartan (COZAAR) 25 MG tablet Take 25 mg by mouth daily.   metoCLOPramide (REGLAN) 5 MG tablet Take 1 tablet (5 mg total) by mouth 3 (three) times daily before meals.   NON FORMULARY Diet:Regular thin liquids  ALLERGIC TO FISH  omeprazole (PRILOSEC) 40 MG capsule Take 40 mg by mouth daily at 6 (six) AM. (0600)   ondansetron (ZOFRAN) 4 MG tablet Take 4 mg by mouth every 6 (six) hours as needed for nausea or vomiting.   rosuvastatin (CRESTOR) 5 MG tablet Take 5 mg by mouth. Once A Day on Mon, Wed, Fri   Wound Dressings (ALLEVYN LIFE HEEL) PADS Apply 1 patch topically as directed. Every 5 days and as needed for prevention   No facility-administered encounter medications on file as of 08/29/2021.     SIGNIFICANT DIAGNOSTIC EXAMS  PREVIOUS  05-24-20: dexa: t score -2.387  NO NEW EXAMS.     LABS REVIEWED PREVIOUS    08-27-20: wbc 7.8; hgb 11.0; hct 35.0; mcv 994.6 plt 218 09-05-20: tsh 2.291 free t4: 0.96 09-10-20: wbc 6.8; hgb 10.2; hct 33.2; mcv 94.3 plt 193 10-08-20: wbc 6.6; hgb 10.2; hct 33.0; mcv 94.8 plt 180;  10-21-20: chol 169; ldl 103; trig 83 hdl 49; hgb a1c 5.0 11-07-20: glucose 87; bun 24; creat 1.15; k+ 3.8; na++ 141; ca 9.2 GFR 49; phos 3.9; albumin 3.6; iron 31; TIBC 185; urine micro-albumin 21.9 11-19-20: wbc 5.8; hgb 10.1; hct 33.1; mcv 94.0 plt 199  01-28-21: wbc 6.9; hgb 11.3; hct 35.6; mcv 93.7 plt 193 01-30-21: hgb a1c 5.2 02-07-21: wbc 7.1; hgb 11.3; hct 34.6; mcv 92.3 plt 185; glucose 117; bun 24; creat 1.16; k+ 4.2; na++ 134; ca 9.4 GFR 49; phos 3.8; albumin 4.2; vit D 27.34 PTH 22; iron 32; tibc 226 02-25-21: vit B 12: 764; folate >100 03-25-21: wbc 8.8; hgb 11.4; hct 36.7; mcv 94.6 plt 203  04-22-21: wbc 7.5; hgb 11.3; hct 36.5; mcv 93.4 plt 165 05-06-21: glucose 91; bun 25; creat 1.17; k+ 4.2; na++ 135; ca 8.9; liver normal  albumin 3.9; hgb a1c 5.3; chol 210; ldl 139; trig 66; hdl 58; vit D 41.14 05-20-21: wbc 8.4; hgb 12.2; hct 40.3; mcv 95.3 plt 176; vit B 12: 777 iron 56; tibc 215; ferritin 284 folate>100.0  06-25-21: glucose 82; bun 31; creat 1.26; k+ 4.6; na++ 138; ca 9.1; GFR 44   NO NEW LABS.   Review of Systems  Constitutional:  Negative for malaise/fatigue.  Respiratory:  Negative for cough and shortness of breath.   Cardiovascular:  Negative for chest pain, palpitations and leg swelling.  Gastrointestinal:  Negative for abdominal pain, constipation and heartburn.  Musculoskeletal:  Negative for back pain, joint pain and myalgias.  Skin: Negative.   Neurological:  Negative for dizziness.  Psychiatric/Behavioral:  The patient is not nervous/anxious.    Physical Exam Constitutional:      General: She is not in acute distress.    Appearance: She is well-developed. She is not diaphoretic.  Neck:     Thyroid: No thyromegaly.  Cardiovascular:     Rate and Rhythm: Normal rate and regular rhythm.     Pulses: Normal pulses.     Heart sounds: Normal heart sounds.  Pulmonary:     Effort: Pulmonary effort is normal. No respiratory distress.     Breath sounds: Normal breath sounds.  Abdominal:     General: Bowel sounds are normal. There is no distension.     Palpations: Abdomen is soft.     Tenderness: There is no abdominal tenderness.  Musculoskeletal:        General: Normal range of motion.     Cervical back: Neck supple.     Right lower leg: No edema.     Left lower  leg: No edema.  Lymphadenopathy:     Cervical: No cervical adenopathy.  Skin:    General: Skin is warm and dry.  Neurological:     Mental Status: She is alert and oriented to person, place, and time.  Psychiatric:        Mood and Affect: Mood normal.      ASSESSMENT/ PLAN:  TODAY  Hypertension associated with stage 3 chronic kidney disease due to type 2 diabetes mellitus Diabetic peripheral neuropathy associated with type  2 diabetes mellitus Cervical myelopathy   Will continue current medications  Will continue current plan of care Will continue to monitor her status.   Time spent with patient: 40 minutes: plan of care; medications.     Ok Edwards NP [facility]  call [phone]

## 2021-08-31 ENCOUNTER — Other Ambulatory Visit (HOSPITAL_COMMUNITY)
Admission: RE | Admit: 2021-08-31 | Discharge: 2021-08-31 | Disposition: A | Discharge status: Home / Self Care | Payer: Medicare Other | Source: Skilled Nursing Facility | Attending: Adult Health | Admitting: Adult Health

## 2021-08-31 DIAGNOSIS — R059 Cough, unspecified: Secondary | ICD-10-CM | POA: Diagnosis not present

## 2021-08-31 DIAGNOSIS — U071 COVID-19: Secondary | ICD-10-CM | POA: Diagnosis not present

## 2021-08-31 LAB — CBC
HCT: 32.5 % — ABNORMAL LOW (ref 36.0–46.0)
Hemoglobin: 10.4 g/dL — ABNORMAL LOW (ref 12.0–15.0)
MCH: 30.1 pg (ref 26.0–34.0)
MCHC: 32 g/dL (ref 30.0–36.0)
MCV: 93.9 fL (ref 80.0–100.0)
Platelets: 181 10*3/uL (ref 150–400)
RBC: 3.46 MIL/uL — ABNORMAL LOW (ref 3.87–5.11)
RDW: 13.9 % (ref 11.5–15.5)
WBC: 6.5 10*3/uL (ref 4.0–10.5)
nRBC: 0 % (ref 0.0–0.2)

## 2021-08-31 LAB — BASIC METABOLIC PANEL
Anion gap: 9 (ref 5–15)
BUN: 29 mg/dL — ABNORMAL HIGH (ref 8–23)
CO2: 23 mmol/L (ref 22–32)
Calcium: 8.6 mg/dL — ABNORMAL LOW (ref 8.9–10.3)
Chloride: 103 mmol/L (ref 98–111)
Creatinine, Ser: 1.59 mg/dL — ABNORMAL HIGH (ref 0.44–1.00)
GFR, Estimated: 33 mL/min — ABNORMAL LOW (ref >60)
Glucose, Bld: 118 mg/dL — ABNORMAL HIGH (ref 70–99)
Potassium: 4.2 mmol/L (ref 3.5–5.1)
Sodium: 135 mmol/L (ref 135–145)

## 2021-08-31 LAB — C-REACTIVE PROTEIN: CRP: 1.6 mg/dL — ABNORMAL HIGH (ref <1.0)

## 2021-08-31 LAB — D-DIMER, QUANTITATIVE: D-Dimer, Quant: 0.53 ug/mL-FEU — ABNORMAL HIGH (ref 0.00–0.50)

## 2021-09-01 ENCOUNTER — Encounter: Payer: Self-pay | Admitting: Adult Health

## 2021-09-01 ENCOUNTER — Non-Acute Institutional Stay (SKILLED_NURSING_FACILITY): Payer: Medicare Other | Admitting: Adult Health

## 2021-09-01 DIAGNOSIS — R262 Difficulty in walking, not elsewhere classified: Secondary | ICD-10-CM | POA: Diagnosis not present

## 2021-09-01 DIAGNOSIS — I1310 Hypertensive heart and chronic kidney disease without heart failure, with stage 1 through stage 4 chronic kidney disease, or unspecified chronic kidney disease: Secondary | ICD-10-CM | POA: Diagnosis not present

## 2021-09-01 DIAGNOSIS — U071 COVID-19: Secondary | ICD-10-CM

## 2021-09-01 DIAGNOSIS — E119 Type 2 diabetes mellitus without complications: Secondary | ICD-10-CM | POA: Diagnosis not present

## 2021-09-01 DIAGNOSIS — Z9181 History of falling: Secondary | ICD-10-CM | POA: Diagnosis not present

## 2021-09-01 DIAGNOSIS — M6281 Muscle weakness (generalized): Secondary | ICD-10-CM | POA: Diagnosis not present

## 2021-09-01 DIAGNOSIS — M4802 Spinal stenosis, cervical region: Secondary | ICD-10-CM | POA: Diagnosis not present

## 2021-09-01 NOTE — Progress Notes (Signed)
Location:  [facility] Room Number: 150-P Place of Service:  SNF (31)   CODE STATUS: DNR  Allergies  Allergen Reactions   Latex Rash   Levofloxacin Nausea And Vomiting   Neomycin-Bacitracin-Polymyxin  [Bacitracin-Neomycin-Polymyxin] Rash   Other Swelling and Rash    TOMATO  SOUP-PER MAR   Prednisone Hives, Itching, Rash and Other (See Comments)   Fish Allergy     rash   Shellfish Allergy Hives   Neosporin [Neomycin-Polymyxin-Gramicidin] Itching and Rash    Chief Complaint  Patient presents with   Acute Visit    COVID positive     HPI:  She has tested positive to covid. She does have a cough nonproductive and is slightly hoarse. There are no body aches or pain; no shortness of breath. No reports of fever present. Her crp is 1.6.   Past Medical History:  Diagnosis Date   Allergy    Anemia    pernicious anemia   Arthritis    Asthma    Blood transfusion without reported diagnosis    CKD (chronic kidney disease)    COPD (chronic obstructive pulmonary disease) ([place]) 12/23/2019   Diabetic peripheral neuropathy associated with type 2 diabetes mellitus ([place]) 04/07/2021   Gastritis    GERD (gastroesophageal reflux disease)    Glaucoma    History of blood in urine    Hyperlipidemia    Hypertension    Neuropathy    both feet   Ocular hypertension    Osteoporosis    Stroke ([place])    Type 2 diabetes mellitus ([place]) 12/23/2019   Vitamin B12 deficiency anemia due to intrinsic factor deficiency     Past Surgical History:  Procedure Laterality Date   CHOLECYSTECTOMY  2008   COLONOSCOPY  04/30/2011   Procedure: COLONOSCOPY;  Surgeon: Rogene Houston, MD;  [facility];  Service: Endoscopy;  Laterality: N/A;   COLONOSCOPY  05/25/2012   Procedure: COLONOSCOPY;  Surgeon: Rogene Houston, MD;  [facility];  Service: Endoscopy;  Laterality: N/A;  1:25-changed to 1200 Ann to notify pt   COLONOSCOPY Bilateral 12/2017   COLONOSCOPY WITH  PROPOFOL N/A 11/29/2020   Procedure: COLONOSCOPY WITH PROPOFOL;  Surgeon: Harvel Quale, MD;  [facility];  Service: Gastroenterology;  Laterality: N/A;  1:35 [facility] is in the [facility]   ESOPHAGOGASTRODUODENOSCOPY N/A 12/07/2019   Procedure: ESOPHAGOGASTRODUODENOSCOPY (EGD);  Surgeon: Rogene Houston, MD;  [facility];  Service: Endoscopy;  Laterality: N/A;  155   GALLBLADDER SURGERY     right breast cystectomy  1988   TUBAL LIGATION  1975    Social History   Socioeconomic History   Marital status: Divorced    Spouse name: Not on file   Number of children: 4   Years of education: 9   Highest education level: Not on file  Occupational History   Occupation: retired   Tobacco Use   Smoking status: Former    Packs/day: 1.00    Years: 40.00    Pack years: 40.00    Types: Cigarettes    Quit date: 04/03/2011    Years since quitting: 10.4   Smokeless tobacco: Never  Vaping Use   Vaping Use: Never used  Substance and Sexual Activity   Alcohol use: No   Drug use: No   Sexual activity: Not Currently  Other Topics Concern   Not on file  Social History Narrative   01/17[birth year]20 lives at [facility], [place]  Social Determinants of Health   Financial Resource Strain: Not on file  Food Insecurity: Not on file  Transportation Needs: Not on file  Physical Activity: Not on file  Stress: Not on file  Social Connections: Not on file  Intimate Partner Violence: Not on file   Family History  Problem Relation Age of Onset   Diabetes Mother    Hyperlipidemia Mother    Hypertension Mother    Heart disease Father    Hyperlipidemia Father    Diabetes Sister    Hyperlipidemia Sister    Hypertension Sister    Cancer Brother    Heart disease Brother    Hyperlipidemia Brother    Hypertension Brother    Diabetes Sister       VITAL SIGNS BP (!) 96/55    Pulse 70    Temp (!) 97.5 F (36.4 C)    Resp 20    Ht 5\' 4"  (1.626 m)    Wt 109 lb  (49.4 kg)    SpO2 95%    BMI 18.71 kg/m   Outpatient Encounter Medications as of 09/01/2021  Medication Sig   acetaminophen (TYLENOL) 325 MG tablet Take 2 tablets (650 mg total) by mouth every 6 (six) hours as needed for mild pain (or Fever >/= 101).   albuterol (VENTOLIN HFA) 108 (90 Base) MCG/ACT inhaler Inhale 2 puffs into the lungs every 4 (four) hours as needed for wheezing or shortness of breath.   amLODipine (NORVASC) 10 MG tablet Take 1 tablet (10 mg total) by mouth daily.   aspirin EC 81 MG tablet Take 81 mg by mouth daily. Swallow whole.   Balsam [place]-Castor Oil (VENELEX) OINT Apply 1 application topically as needed. Apply to sacrum and bilateral buttocks Every Shift - PRN; PRN 1, PRN 2, PRN 3   benzocaine-menthol (SORE THROAT) 6-10 MG lozenge Take 1 lozenge by mouth as needed for sore throat.   cyanocobalamin (,VITAMIN B-12,) 1000 MCG/ML injection Inject 1 mL (1,000 mcg total) into the muscle every 30 (thirty) days.   docusate sodium (COLACE) 100 MG capsule Take 1 capsule (100 mg total) by mouth 2 (two) times daily.   dorzolamide (TRUSOPT) 2 % ophthalmic solution Place 1 drop into both eyes 2 (two) times daily. (0900 & 1700)   ergocalciferol (VITAMIN D2) 1.25 MG (50000 UT) capsule Take 50,000 Units by mouth once a week. Thursday   folic acid (FOLVITE) 1 MG tablet Take 1 mg by mouth daily. (0900)   gabapentin (NEURONTIN) 100 MG capsule Take 100 mg by mouth at bedtime. (2100)   Homeopathic Products (ZINC) LOZG (with A and C) Lozenges (vit a-vit c-zinc-propolis) lozenge; - ; oral Special Instructions: Give 5x/day x 2 weeks. 5 Times Per Day   Iron, Ferrous Sulfate, 325 (65 Fe) MG TABS Take by mouth. amt: 1 tab; oral,Once A Day   latanoprost (XALATAN) 0.005 % ophthalmic solution Place 1 drop into both eyes at bedtime. (2100)   loperamide (IMODIUM A-D) 2 MG tablet Take 2 mg by mouth every 4 (four) hours as needed for diarrhea or loose stools.   losartan (COZAAR) 25 MG tablet Take 25 mg by  mouth daily.   metoCLOPramide (REGLAN) 5 MG tablet Take 1 tablet (5 mg total) by mouth 3 (three) times daily before meals.   NON FORMULARY Diet:Regular thin liquids  ALLERGIC TO FISH   omeprazole (PRILOSEC) 40 MG capsule Take 40 mg by mouth daily at 6 (six) AM. (0600)   ondansetron (ZOFRAN) 4 MG tablet Take 4  mg by mouth every 6 (six) hours as needed for nausea or vomiting.   rosuvastatin (CRESTOR) 5 MG tablet Take 5 mg by mouth. Once A Day on Mon, Wed, Fri   vitamin C (ASCORBIC ACID) 500 MG tablet Take 500 mg by mouth 2 (two) times daily.   Wound Dressings (ALLEVYN LIFE HEEL) PADS Apply 1 patch topically as directed. Every 5 days and as needed for prevention   No facility-administered encounter medications on file as of 09/01/2021.     SIGNIFICANT DIAGNOSTIC EXAMS  PREVIOUS  05-24-20: dexa: t score -2.387  NO NEW EXAMS.     LABS REVIEWED PREVIOUS    08-27-20: wbc 7.8; hgb 11.0; hct 35.0; mcv 994.6 plt 218 09-05-20: tsh 2.291 free t4: 0.96 09-10-20: wbc 6.8; hgb 10.2; hct 33.2; mcv 94.3 plt 193 10-08-20: wbc 6.6; hgb 10.2; hct 33.0; mcv 94.8 plt 180;  10-21-20: chol 169; ldl 103; trig 83 hdl 49; hgb a1c 5.0 11-07-20: glucose 87; bun 24; creat 1.15; k+ 3.8; na++ 141; ca 9.2 GFR 49; phos 3.9; albumin 3.6; iron 31; TIBC 185; urine micro-albumin 21.9 11-19-20: wbc 5.8; hgb 10.1; hct 33.1; mcv 94.0 plt 199  01-28-21: wbc 6.9; hgb 11.3; hct 35.6; mcv 93.7 plt 193 01-30-21: hgb a1c 5.2 02-07-21: wbc 7.1; hgb 11.3; hct 34.6; mcv 92.3 plt 185; glucose 117; bun 24; creat 1.16; k+ 4.2; na++ 134; ca 9.4 GFR 49; phos 3.8; albumin 4.2; vit D 27.34 PTH 22; iron 32; tibc 226 02-25-21: vit B 12: 764; folate >100 03-25-21: wbc 8.8; hgb 11.4; hct 36.7; mcv 94.6 plt 203  04-22-21: wbc 7.5; hgb 11.3; hct 36.5; mcv 93.4 plt 165 05-06-21: glucose 91; bun 25; creat 1.17; k+ 4.2; na++ 135; ca 8.9; liver normal albumin 3.9; hgb a1c 5.3; chol 210; ldl 139; trig 66; hdl 58; vit D 41.14 05-20-21: wbc 8.4; hgb 12.2; hct  40.3; mcv 95.3 plt 176; vit B 12: 777 iron 56; tibc 215; ferritin 284 folate>100.0  06-25-21: glucose 82; bun 31; creat 1.26; k+ 4.6; na++ 138; ca 9.1; GFR 44   TODAY  08-31-21: wbc 6.5; hgb 10.4; hct 32.5; mcv 93.9 plt 181; glucose 118; bun 29; creat 1.59; k+ 4.2; na++ 135; ca 8.6; GFR 33; d-dimer 0.53 crp 1.6   Review of Systems  Constitutional:  Negative for malaise/fatigue.  HENT:         Hoarse   Respiratory:  Positive for cough. Negative for shortness of breath.   Cardiovascular:  Negative for chest pain, palpitations and leg swelling.  Gastrointestinal:  Negative for abdominal pain, constipation and heartburn.  Musculoskeletal:  Negative for back pain, joint pain and myalgias.  Skin: Negative.   Neurological:  Negative for dizziness.  Psychiatric/Behavioral:  The patient is not nervous/anxious.     Physical Exam Constitutional:      General: She is not in acute distress.    Appearance: She is well-developed. She is not diaphoretic.  Neck:     Thyroid: No thyromegaly.  Cardiovascular:     Rate and Rhythm: Normal rate and regular rhythm.     Pulses: Normal pulses.     Heart sounds: Normal heart sounds.  Pulmonary:     Effort: Pulmonary effort is normal. No respiratory distress.     Breath sounds: Normal breath sounds.  Abdominal:     General: Bowel sounds are normal. There is no distension.     Palpations: Abdomen is soft.     Tenderness: There is no abdominal tenderness.  Musculoskeletal:  General: Normal range of motion.     Cervical back: Neck supple.     Right lower leg: No edema.     Left lower leg: No edema.  Lymphadenopathy:     Cervical: No cervical adenopathy.  Skin:    General: Skin is warm and dry.  Neurological:     Mental Status: She is alert and oriented to person, place, and time.  Psychiatric:        Mood and Affect: Mood normal.      ASSESSMENT/ PLAN:  TODAY  Covid 19 with comorbidity diabetes mellitus:  Will continue current  regimen to include the antivirals.  Will monitor her status.    Ok Edwards NP [facility]   call [phone]

## 2021-09-02 DIAGNOSIS — M6281 Muscle weakness (generalized): Secondary | ICD-10-CM | POA: Diagnosis not present

## 2021-09-02 DIAGNOSIS — I1310 Hypertensive heart and chronic kidney disease without heart failure, with stage 1 through stage 4 chronic kidney disease, or unspecified chronic kidney disease: Secondary | ICD-10-CM | POA: Diagnosis not present

## 2021-09-02 DIAGNOSIS — M4802 Spinal stenosis, cervical region: Secondary | ICD-10-CM | POA: Diagnosis not present

## 2021-09-02 DIAGNOSIS — R262 Difficulty in walking, not elsewhere classified: Secondary | ICD-10-CM | POA: Diagnosis not present

## 2021-09-02 DIAGNOSIS — Z9181 History of falling: Secondary | ICD-10-CM | POA: Diagnosis not present

## 2021-09-03 DIAGNOSIS — I1310 Hypertensive heart and chronic kidney disease without heart failure, with stage 1 through stage 4 chronic kidney disease, or unspecified chronic kidney disease: Secondary | ICD-10-CM | POA: Diagnosis not present

## 2021-09-03 DIAGNOSIS — M4802 Spinal stenosis, cervical region: Secondary | ICD-10-CM | POA: Diagnosis not present

## 2021-09-03 DIAGNOSIS — Z9181 History of falling: Secondary | ICD-10-CM | POA: Diagnosis not present

## 2021-09-03 DIAGNOSIS — M6281 Muscle weakness (generalized): Secondary | ICD-10-CM | POA: Diagnosis not present

## 2021-09-03 DIAGNOSIS — R262 Difficulty in walking, not elsewhere classified: Secondary | ICD-10-CM | POA: Diagnosis not present

## 2021-09-04 DIAGNOSIS — M4802 Spinal stenosis, cervical region: Secondary | ICD-10-CM | POA: Diagnosis not present

## 2021-09-04 DIAGNOSIS — R262 Difficulty in walking, not elsewhere classified: Secondary | ICD-10-CM | POA: Diagnosis not present

## 2021-09-04 DIAGNOSIS — I1310 Hypertensive heart and chronic kidney disease without heart failure, with stage 1 through stage 4 chronic kidney disease, or unspecified chronic kidney disease: Secondary | ICD-10-CM | POA: Diagnosis not present

## 2021-09-04 DIAGNOSIS — M6281 Muscle weakness (generalized): Secondary | ICD-10-CM | POA: Diagnosis not present

## 2021-09-04 DIAGNOSIS — Z9181 History of falling: Secondary | ICD-10-CM | POA: Diagnosis not present

## 2021-09-05 DIAGNOSIS — R262 Difficulty in walking, not elsewhere classified: Secondary | ICD-10-CM | POA: Diagnosis not present

## 2021-09-05 DIAGNOSIS — Z9181 History of falling: Secondary | ICD-10-CM | POA: Diagnosis not present

## 2021-09-05 DIAGNOSIS — I1310 Hypertensive heart and chronic kidney disease without heart failure, with stage 1 through stage 4 chronic kidney disease, or unspecified chronic kidney disease: Secondary | ICD-10-CM | POA: Diagnosis not present

## 2021-09-05 DIAGNOSIS — M4802 Spinal stenosis, cervical region: Secondary | ICD-10-CM | POA: Diagnosis not present

## 2021-09-05 DIAGNOSIS — M6281 Muscle weakness (generalized): Secondary | ICD-10-CM | POA: Diagnosis not present

## 2021-09-08 DIAGNOSIS — M4802 Spinal stenosis, cervical region: Secondary | ICD-10-CM | POA: Diagnosis not present

## 2021-09-08 DIAGNOSIS — R262 Difficulty in walking, not elsewhere classified: Secondary | ICD-10-CM | POA: Diagnosis not present

## 2021-09-08 DIAGNOSIS — Z9181 History of falling: Secondary | ICD-10-CM | POA: Diagnosis not present

## 2021-09-08 DIAGNOSIS — M6281 Muscle weakness (generalized): Secondary | ICD-10-CM | POA: Diagnosis not present

## 2021-09-08 DIAGNOSIS — I1310 Hypertensive heart and chronic kidney disease without heart failure, with stage 1 through stage 4 chronic kidney disease, or unspecified chronic kidney disease: Secondary | ICD-10-CM | POA: Diagnosis not present

## 2021-09-09 DIAGNOSIS — M4802 Spinal stenosis, cervical region: Secondary | ICD-10-CM | POA: Diagnosis not present

## 2021-09-09 DIAGNOSIS — I1310 Hypertensive heart and chronic kidney disease without heart failure, with stage 1 through stage 4 chronic kidney disease, or unspecified chronic kidney disease: Secondary | ICD-10-CM | POA: Diagnosis not present

## 2021-09-09 DIAGNOSIS — M6281 Muscle weakness (generalized): Secondary | ICD-10-CM | POA: Diagnosis not present

## 2021-09-09 DIAGNOSIS — Z9181 History of falling: Secondary | ICD-10-CM | POA: Diagnosis not present

## 2021-09-09 DIAGNOSIS — R262 Difficulty in walking, not elsewhere classified: Secondary | ICD-10-CM | POA: Diagnosis not present

## 2021-09-10 DIAGNOSIS — I1310 Hypertensive heart and chronic kidney disease without heart failure, with stage 1 through stage 4 chronic kidney disease, or unspecified chronic kidney disease: Secondary | ICD-10-CM | POA: Diagnosis not present

## 2021-09-10 DIAGNOSIS — R262 Difficulty in walking, not elsewhere classified: Secondary | ICD-10-CM | POA: Diagnosis not present

## 2021-09-10 DIAGNOSIS — M6281 Muscle weakness (generalized): Secondary | ICD-10-CM | POA: Diagnosis not present

## 2021-09-10 DIAGNOSIS — M4802 Spinal stenosis, cervical region: Secondary | ICD-10-CM | POA: Diagnosis not present

## 2021-09-10 DIAGNOSIS — Z9181 History of falling: Secondary | ICD-10-CM | POA: Diagnosis not present

## 2021-09-11 DIAGNOSIS — M4802 Spinal stenosis, cervical region: Secondary | ICD-10-CM | POA: Diagnosis not present

## 2021-09-11 DIAGNOSIS — I1310 Hypertensive heart and chronic kidney disease without heart failure, with stage 1 through stage 4 chronic kidney disease, or unspecified chronic kidney disease: Secondary | ICD-10-CM | POA: Diagnosis not present

## 2021-09-11 DIAGNOSIS — R262 Difficulty in walking, not elsewhere classified: Secondary | ICD-10-CM | POA: Diagnosis not present

## 2021-09-11 DIAGNOSIS — M6281 Muscle weakness (generalized): Secondary | ICD-10-CM | POA: Diagnosis not present

## 2021-09-11 DIAGNOSIS — Z9181 History of falling: Secondary | ICD-10-CM | POA: Diagnosis not present

## 2021-09-12 DIAGNOSIS — I1310 Hypertensive heart and chronic kidney disease without heart failure, with stage 1 through stage 4 chronic kidney disease, or unspecified chronic kidney disease: Secondary | ICD-10-CM | POA: Diagnosis not present

## 2021-09-12 DIAGNOSIS — M4802 Spinal stenosis, cervical region: Secondary | ICD-10-CM | POA: Diagnosis not present

## 2021-09-12 DIAGNOSIS — Z9181 History of falling: Secondary | ICD-10-CM | POA: Diagnosis not present

## 2021-09-12 DIAGNOSIS — R262 Difficulty in walking, not elsewhere classified: Secondary | ICD-10-CM | POA: Diagnosis not present

## 2021-09-12 DIAGNOSIS — M6281 Muscle weakness (generalized): Secondary | ICD-10-CM | POA: Diagnosis not present

## 2021-09-15 DIAGNOSIS — R262 Difficulty in walking, not elsewhere classified: Secondary | ICD-10-CM | POA: Diagnosis not present

## 2021-09-15 DIAGNOSIS — M6281 Muscle weakness (generalized): Secondary | ICD-10-CM | POA: Diagnosis not present

## 2021-09-15 DIAGNOSIS — I1310 Hypertensive heart and chronic kidney disease without heart failure, with stage 1 through stage 4 chronic kidney disease, or unspecified chronic kidney disease: Secondary | ICD-10-CM | POA: Diagnosis not present

## 2021-09-15 DIAGNOSIS — M4802 Spinal stenosis, cervical region: Secondary | ICD-10-CM | POA: Diagnosis not present

## 2021-09-15 DIAGNOSIS — Z9181 History of falling: Secondary | ICD-10-CM | POA: Diagnosis not present

## 2021-09-16 DIAGNOSIS — H401132 Primary open-angle glaucoma, bilateral, moderate stage: Secondary | ICD-10-CM | POA: Diagnosis not present

## 2021-09-22 ENCOUNTER — Other Ambulatory Visit (HOSPITAL_COMMUNITY)
Admission: RE | Admit: 2021-09-22 | Discharge: 2021-09-22 | Disposition: A | Discharge status: Home / Self Care | Payer: Medicare Other | Source: Skilled Nursing Facility | Attending: Adult Health | Admitting: Adult Health

## 2021-09-22 DIAGNOSIS — N1832 Chronic kidney disease, stage 3b: Secondary | ICD-10-CM | POA: Insufficient documentation

## 2021-09-23 LAB — CBC WITH DIFFERENTIAL/PLATELET
Abs Immature Granulocytes: 0.01 10*3/uL (ref 0.00–0.07)
Basophils Absolute: 0 10*3/uL (ref 0.0–0.1)
Basophils Relative: 1 %
Eosinophils Absolute: 0.5 10*3/uL (ref 0.0–0.5)
Eosinophils Relative: 7 %
HCT: 29.4 % — ABNORMAL LOW (ref 36.0–46.0)
Hemoglobin: 9.2 g/dL — ABNORMAL LOW (ref 12.0–15.0)
Immature Granulocytes: 0 %
Lymphocytes Relative: 38 %
Lymphs Abs: 2.4 10*3/uL (ref 0.7–4.0)
MCH: 29.7 pg (ref 26.0–34.0)
MCHC: 31.3 g/dL (ref 30.0–36.0)
MCV: 94.8 fL (ref 80.0–100.0)
Monocytes Absolute: 0.6 10*3/uL (ref 0.1–1.0)
Monocytes Relative: 10 %
Neutro Abs: 2.8 10*3/uL (ref 1.7–7.7)
Neutrophils Relative %: 44 %
Platelets: 182 10*3/uL (ref 150–400)
RBC: 3.1 MIL/uL — ABNORMAL LOW (ref 3.87–5.11)
RDW: 14.8 % (ref 11.5–15.5)
WBC: 6.3 10*3/uL (ref 4.0–10.5)
nRBC: 0 % (ref 0.0–0.2)

## 2021-09-23 LAB — PROTEIN, URINE, RANDOM: Total Protein, Urine: 8 mg/dL

## 2021-09-30 DIAGNOSIS — R809 Proteinuria, unspecified: Secondary | ICD-10-CM | POA: Diagnosis not present

## 2021-09-30 DIAGNOSIS — N189 Chronic kidney disease, unspecified: Secondary | ICD-10-CM | POA: Diagnosis not present

## 2021-09-30 DIAGNOSIS — E1129 Type 2 diabetes mellitus with other diabetic kidney complication: Secondary | ICD-10-CM | POA: Diagnosis not present

## 2021-09-30 DIAGNOSIS — R634 Abnormal weight loss: Secondary | ICD-10-CM | POA: Diagnosis not present

## 2021-09-30 DIAGNOSIS — D638 Anemia in other chronic diseases classified elsewhere: Secondary | ICD-10-CM | POA: Diagnosis not present

## 2021-09-30 DIAGNOSIS — E1122 Type 2 diabetes mellitus with diabetic chronic kidney disease: Secondary | ICD-10-CM | POA: Diagnosis not present

## 2021-10-08 ENCOUNTER — Other Ambulatory Visit (HOSPITAL_COMMUNITY)
Admission: RE | Admit: 2021-10-08 | Discharge: 2021-10-08 | Disposition: A | Discharge status: Home / Self Care | Payer: Medicare Other | Source: Skilled Nursing Facility | Attending: Adult Health | Admitting: Adult Health

## 2021-10-08 ENCOUNTER — Non-Acute Institutional Stay (SKILLED_NURSING_FACILITY): Payer: Medicare Other | Admitting: Adult Health

## 2021-10-08 ENCOUNTER — Encounter: Payer: Self-pay | Admitting: Adult Health

## 2021-10-08 ENCOUNTER — Encounter (HOSPITAL_COMMUNITY)
Admit: 2021-10-08 | Discharge: 2021-10-08 | Disposition: A | Discharge status: Home / Self Care | Payer: Medicare Other | Attending: Nephrology | Admitting: Nephrology

## 2021-10-08 ENCOUNTER — Encounter (HOSPITAL_COMMUNITY): Payer: Self-pay

## 2021-10-08 DIAGNOSIS — N1832 Chronic kidney disease, stage 3b: Secondary | ICD-10-CM | POA: Insufficient documentation

## 2021-10-08 DIAGNOSIS — I700 Atherosclerosis of aorta: Secondary | ICD-10-CM | POA: Diagnosis not present

## 2021-10-08 DIAGNOSIS — E1122 Type 2 diabetes mellitus with diabetic chronic kidney disease: Secondary | ICD-10-CM | POA: Insufficient documentation

## 2021-10-08 DIAGNOSIS — D631 Anemia in chronic kidney disease: Secondary | ICD-10-CM | POA: Diagnosis not present

## 2021-10-08 DIAGNOSIS — J449 Chronic obstructive pulmonary disease, unspecified: Secondary | ICD-10-CM | POA: Diagnosis not present

## 2021-10-08 DIAGNOSIS — Z86718 Personal history of other venous thrombosis and embolism: Secondary | ICD-10-CM | POA: Diagnosis not present

## 2021-10-08 LAB — RENAL FUNCTION PANEL
Albumin: 3.9 g/dL (ref 3.5–5.0)
Anion gap: 5 (ref 5–15)
BUN: 32 mg/dL — ABNORMAL HIGH (ref 8–23)
CO2: 26 mmol/L (ref 22–32)
Calcium: 9 mg/dL (ref 8.9–10.3)
Chloride: 102 mmol/L (ref 98–111)
Creatinine, Ser: 1.43 mg/dL — ABNORMAL HIGH (ref 0.44–1.00)
GFR, Estimated: 38 mL/min — ABNORMAL LOW (ref >60)
Glucose, Bld: 91 mg/dL (ref 70–99)
Phosphorus: 4.7 mg/dL — ABNORMAL HIGH (ref 2.5–4.6)
Potassium: 4.1 mmol/L (ref 3.5–5.1)
Sodium: 133 mmol/L — ABNORMAL LOW (ref 135–145)

## 2021-10-08 LAB — POCT HEMOGLOBIN-HEMACUE: Hemoglobin: 11.6 g/dL — ABNORMAL LOW (ref 12.0–15.0)

## 2021-10-08 MED ORDER — EPOETIN ALFA-EPBX 3000 UNIT/ML IJ SOLN: 3000.0000 [IU] | Freq: Once | INTRAMUSCULAR | Status: DC

## 2021-10-08 NOTE — Progress Notes (Signed)
?Location:  [place] [place] [facility] Room Number: 150 ?Place of Service:  SNF (31) ?Provider:  Ok Edwards, NP ? ? ?CODE STATUS: DNR ? ?Allergies  ?Allergen Reactions  ? Latex Rash  ? Levofloxacin Nausea And Vomiting  ? Neomycin-Bacitracin-Polymyxin  [Bacitracin-Neomycin-Polymyxin] Rash  ? Other Swelling and Rash  ?  TOMATO  SOUP-PER MAR  ? Prednisone Hives, Itching, Rash and Other (See Comments)  ? Fish Allergy   ?  rash  ? Shellfish Allergy Hives  ? Neosporin [Neomycin-Polymyxin-Gramicidin] Itching and Rash  ? ? ?Chief Complaint  ?Patient presents with  ? Medical Management of Chronic Issues  ?              Failure to thrive in adult: Aortic atherosclerosis:  History of dvt in left lower extremity Chronic obstructive pulmonary disease unspecified COPD type  ? ? ?HPI: ? ?She is a 76 year old long term resident of this facility being seen for the management of her chronic illnesses:  Failure to thrive in adult: Aortic atherosclerosis:  History of dvt in left lower extremity Chronic obstructive pulmonary disease unspecified COPD type. There are no reports of uncontrolled pain. She does get out of bed daily. Her weight is stable.  ? ?Past Medical History:  ?Diagnosis Date  ? Allergy   ? Anemia   ? pernicious anemia  ? Arthritis   ? Asthma   ? Blood transfusion without reported diagnosis   ? CKD (chronic kidney disease)   ? COPD (chronic obstructive pulmonary disease) ([place]) 12/23/2019  ? Diabetic peripheral neuropathy associated with type 2 diabetes mellitus ([place]) 04/07/2021  ? Gastritis   ? GERD (gastroesophageal reflux disease)   ? Glaucoma   ? History of blood in urine   ? Hyperlipidemia   ? Hypertension   ? Neuropathy   ? both feet  ? Ocular hypertension   ? Osteoporosis   ? Stroke [facility])   ? Type 2 diabetes mellitus ([place]) 12/23/2019  ? Vitamin B12 deficiency anemia due to intrinsic factor deficiency   ? ? ?Past Surgical History:  ?Procedure Laterality Date  ? CHOLECYSTECTOMY  2008  ? COLONOSCOPY   04/30/2011  ? Procedure: COLONOSCOPY;  Surgeon: Rogene Houston, MD;  [facility];  Service: Endoscopy;  Laterality: N/A;  ? COLONOSCOPY  05/25/2012  ? Procedure: COLONOSCOPY;  Surgeon: Rogene Houston, MD;  [facility];  Service: Endoscopy;  Laterality: N/A;  1:25-changed to 1200 Ann to notify pt  ? COLONOSCOPY Bilateral 12/2017  ? COLONOSCOPY WITH PROPOFOL N/A 11/29/2020  ? Procedure: COLONOSCOPY WITH PROPOFOL;  Surgeon: Harvel Quale, MD;  [facility];  Service: Gastroenterology;  Laterality: N/A;  1:35 [facility] is in the [facility]  ? ESOPHAGOGASTRODUODENOSCOPY N/A 12/07/2019  ? Procedure: ESOPHAGOGASTRODUODENOSCOPY (EGD);  Surgeon: Rogene Houston, MD;  [facility];  Service: Endoscopy;  Laterality: N/A;  155  ? GALLBLADDER SURGERY    ? right breast cystectomy  1988  ? TUBAL LIGATION  1975  ? ? ?Social History  ? ?Socioeconomic History  ? Marital status: Divorced  ?  Spouse name: Not on file  ? Number of children: 4  ? Years of education: 51  ? Highest education level: Not on file  ?Occupational History  ? Occupation: retired   ?Tobacco Use  ? Smoking status: Former  ?  Packs/day: 1.00  ?  Years: 40.00  ?  Pack years: 40.00  ?  Types: Cigarettes  ?  Quit date:  04/03/2011  ?  Years since quitting: 10.5  ? Smokeless tobacco: Never  ?Vaping Use  ? Vaping Use: Never used  ?Substance and Sexual Activity  ? Alcohol use: No  ? Drug use: No  ? Sexual activity: Not Currently  ?Other Topics Concern  ? Not on file  ?Social History Narrative  ? 01/17[birth year]20 lives at [facility], [place]  ? ?Social Determinants of Health  ? ?Financial Resource Strain: Not on file  ?Food Insecurity: Not on file  ?Transportation Needs: Not on file  ?Physical Activity: Not on file  ?Stress: Not on file  ?Social Connections: Not on file  ?Intimate Partner Violence: Not on file  ? ?Family History  ?Problem Relation Age of Onset  ? Diabetes Mother   ? Hyperlipidemia Mother   ?  Hypertension Mother   ? Heart disease Father   ? Hyperlipidemia Father   ? Diabetes Sister   ? Hyperlipidemia Sister   ? Hypertension Sister   ? Cancer Brother   ? Heart disease Brother   ? Hyperlipidemia Brother   ? Hypertension Brother   ? Diabetes Sister   ? ? ? ? ?VITAL SIGNS ?BP 129/66   Pulse 60   Temp (!) 97.1 ?F (36.2 ?C)   Resp (!) 21   Ht '5\' 4"'$  (1.626 m)   Wt 104 lb 12.5 oz (47.5 kg)   SpO2 95%   BMI 17.99 kg/m?  ? ?Outpatient Encounter Medications as of 10/08/2021  ?Medication Sig  ? acetaminophen (TYLENOL) 325 MG tablet Take 2 tablets (650 mg total) by mouth every 6 (six) hours as needed for mild pain (or Fever >/= 101).  ? albuterol (VENTOLIN HFA) 108 (90 Base) MCG/ACT inhaler Inhale 2 puffs into the lungs every 4 (four) hours as needed for wheezing or shortness of breath.  ? amLODipine (NORVASC) 10 MG tablet Take 1 tablet (10 mg total) by mouth daily.  ? aspirin EC 81 MG tablet Take 81 mg by mouth daily. Swallow whole.  ? Balsam [place]-Castor Oil (VENELEX) OINT Apply 1 application topically as needed. Apply to sacrum and bilateral buttocks Every Shift - PRN; PRN 1, PRN 2, PRN 3  ? benzocaine-menthol (SORE THROAT) 6-10 MG lozenge Take 1 lozenge by mouth as needed for sore throat.  ? cyanocobalamin (,VITAMIN B-12,) 1000 MCG/ML injection Inject 1 mL (1,000 mcg total) into the muscle every 30 (thirty) days.  ? docusate sodium (COLACE) 100 MG capsule Take 1 capsule (100 mg total) by mouth 2 (two) times daily.  ? dorzolamide (TRUSOPT) 2 % ophthalmic solution Place 1 drop into both eyes 2 (two) times daily. (0900 & 1700)  ? ergocalciferol (VITAMIN D2) 1.25 MG (50000 UT) capsule Take 50,000 Units by mouth once a week. Thursday  ? folic acid (FOLVITE) 1 MG tablet Take 1 mg by mouth daily. (0900)  ? gabapentin (NEURONTIN) 100 MG capsule Take 100 mg by mouth at bedtime. (2100)  ? Iron, Ferrous Sulfate, 325 (65 Fe) MG TABS Take by mouth. amt: 1 tab; oral,Once A Day  ? latanoprost (XALATAN) 0.005 % ophthalmic  solution Place 1 drop into both eyes at bedtime. (2100)  ? loperamide (IMODIUM A-D) 2 MG tablet Take 2 mg by mouth every 4 (four) hours as needed for diarrhea or loose stools.  ? losartan (COZAAR) 25 MG tablet Take 25 mg by mouth daily.  ? metoCLOPramide (REGLAN) 5 MG tablet Take 1 tablet (5 mg total) by mouth 3 (three) times daily before meals.  ? NON FORMULARY Diet:Regular  thin liquids  ?ALLERGIC TO FISH  ? omeprazole (PRILOSEC) 40 MG capsule Take 40 mg by mouth daily at 6 (six) AM. (0600)  ? ondansetron (ZOFRAN) 4 MG tablet Take 4 mg by mouth every 6 (six) hours as needed for nausea or vomiting.  ? rosuvastatin (CRESTOR) 5 MG tablet Take 5 mg by mouth. Once A Day on Mon, Wed, Fri  ? [DISCONTINUED] Wound Dressings (ALLEVYN LIFE HEEL) PADS Apply 1 patch topically as directed. Every 5 days and as needed for prevention  ? ?No facility-administered encounter medications on file as of 10/08/2021.  ? ? ? ?SIGNIFICANT DIAGNOSTIC EXAMS ? ? ?PREVIOUS ? ?05-24-20: dexa: t score -2.387 ? ?NO NEW EXAMS.  ?  ? ?LABS REVIEWED PREVIOUS  ?  ?09-10-20: wbc 6.8; hgb 10.2; hct 33.2; mcv 94.3 plt 193 ?10-08-20: wbc 6.6; hgb 10.2; hct 33.0; mcv 94.8 plt 180;  ?10-21-20: chol 169; ldl 103; trig 83 hdl 49; hgb a1c 5.0 ?11-07-20: glucose 87; bun 24; creat 1.15; k+ 3.8; na++ 141; ca 9.2 GFR 49; phos 3.9; albumin 3.6; iron 31; TIBC 185; urine micro-albumin 21.9 ?11-19-20: wbc 5.8; hgb 10.1; hct 33.1; mcv 94.0 plt 199  ?01-28-21: wbc 6.9; hgb 11.3; hct 35.6; mcv 93.7 plt 193 ?01-30-21: hgb a1c 5.2 ?02-07-21: wbc 7.1; hgb 11.3; hct 34.6; mcv 92.3 plt 185; glucose 117; bun 24; creat 1.16; k+ 4.2; na++ 134; ca 9.4 GFR 49; phos 3.8; albumin 4.2; vit D 27.34 PTH 22; iron 32; tibc 226 ?02-25-21: vit B 12: 764; folate >100 ?03-25-21: wbc 8.8; hgb 11.4; hct 36.7; mcv 94.6 plt 203  ?04-22-21: wbc 7.5; hgb 11.3; hct 36.5; mcv 93.4 plt 165 ?05-06-21: glucose 91; bun 25; creat 1.17; k+ 4.2; na++ 135; ca 8.9; liver normal albumin 3.9; hgb a1c 5.3; chol 210; ldl  139; trig 66; hdl 58; vit D 41.14 ?05-20-21: wbc 8.4; hgb 12.2; hct 40.3; mcv 95.3 plt 176; vit B 12: 777 iron 56; tibc 215; ferritin 284 folate>100.0  ?06-25-21: glucose 82; bun 31; creat 1.26; k+ 4.6; na++ 138; ca 9.1; GFR 44  ?

## 2021-10-13 ENCOUNTER — Other Ambulatory Visit (HOSPITAL_COMMUNITY)
Admission: RE | Admit: 2021-10-13 | Discharge: 2021-10-13 | Disposition: A | Discharge status: Home / Self Care | Payer: Medicare Other | Source: Skilled Nursing Facility | Attending: Adult Health | Admitting: Adult Health

## 2021-10-13 DIAGNOSIS — E1122 Type 2 diabetes mellitus with diabetic chronic kidney disease: Secondary | ICD-10-CM | POA: Insufficient documentation

## 2021-10-14 ENCOUNTER — Encounter: Payer: Self-pay | Admitting: Adult Health

## 2021-10-14 LAB — MICROALBUMIN, URINE: Microalb, Ur: 12.8 ug/mL — ABNORMAL HIGH

## 2021-10-15 ENCOUNTER — Other Ambulatory Visit (HOSPITAL_COMMUNITY): Payer: Self-pay | Admitting: *Deleted

## 2021-10-19 DIAGNOSIS — I82402 Acute embolism and thrombosis of unspecified deep veins of left lower extremity: Secondary | ICD-10-CM | POA: Diagnosis not present

## 2021-10-19 DIAGNOSIS — Z1159 Encounter for screening for other viral diseases: Secondary | ICD-10-CM | POA: Diagnosis not present

## 2021-10-23 ENCOUNTER — Encounter (HOSPITAL_COMMUNITY)
Admit: 2021-10-23 | Discharge: 2021-10-23 | Disposition: A | Discharge status: Home / Self Care | Payer: Medicare Other | Attending: Nephrology | Admitting: Nephrology

## 2021-10-23 ENCOUNTER — Encounter (HOSPITAL_COMMUNITY): Payer: Self-pay

## 2021-10-23 DIAGNOSIS — D631 Anemia in chronic kidney disease: Secondary | ICD-10-CM | POA: Diagnosis not present

## 2021-10-23 DIAGNOSIS — N1832 Chronic kidney disease, stage 3b: Secondary | ICD-10-CM | POA: Diagnosis not present

## 2021-10-23 LAB — POCT HEMOGLOBIN-HEMACUE: Hemoglobin: 11.1 g/dL — ABNORMAL LOW (ref 12.0–15.0)

## 2021-10-23 MED ORDER — EPOETIN ALFA-EPBX 3000 UNIT/ML IJ SOLN: 3000.0000 [IU] | Freq: Once | INTRAMUSCULAR | Status: DC

## 2021-10-23 NOTE — Progress Notes (Signed)
?  Patient arrived today for Hgb check.  11.1. Patient informs staff that she will not be coming back to the clinic, has follow- up with the Hematologist in May. Called made to Dr Theador Hawthorne office to clarify. Thayer Headings RN states to discontinue visits with our clinic, due to patient being covered by her hematologist.  Appointment noted May 3rd and May 10 for labs and to see the specialty clinic physician. ? ? ? ? Latest Reference Range & Units 10/23/21 13:58  ?Hemoglobin 12.0 - 15.0 g/dL 11.1 (L)  ?(L): Data is abnormally low ?

## 2021-11-06 ENCOUNTER — Encounter: Payer: Self-pay | Admitting: Adult Health

## 2021-11-06 ENCOUNTER — Non-Acute Institutional Stay (SKILLED_NURSING_FACILITY): Payer: Medicare Other | Admitting: Adult Health

## 2021-11-06 DIAGNOSIS — K2950 Unspecified chronic gastritis without bleeding: Secondary | ICD-10-CM

## 2021-11-06 DIAGNOSIS — N183 Chronic kidney disease, stage 3 unspecified: Secondary | ICD-10-CM

## 2021-11-06 DIAGNOSIS — E1142 Type 2 diabetes mellitus with diabetic polyneuropathy: Secondary | ICD-10-CM | POA: Diagnosis not present

## 2021-11-06 DIAGNOSIS — E1122 Type 2 diabetes mellitus with diabetic chronic kidney disease: Secondary | ICD-10-CM

## 2021-11-06 DIAGNOSIS — N1832 Chronic kidney disease, stage 3b: Secondary | ICD-10-CM

## 2021-11-06 NOTE — Progress Notes (Signed)
?Location:  [facility] Room Number: 150-P ?Place of Service:  SNF (31) ? ? ?CODE STATUS: DNR ? ?Allergies  ?Allergen Reactions  ? Latex Rash  ? Levofloxacin Nausea And Vomiting  ? Neomycin-Bacitracin-Polymyxin  [Bacitracin-Neomycin-Polymyxin] Rash  ? Other Swelling and Rash  ?  TOMATO  SOUP-PER MAR  ? Prednisone Hives, Itching, Rash and Other (See Comments)  ? Fish Allergy   ?  rash  ? Shellfish Allergy Hives  ? Neosporin [Neomycin-Polymyxin-Gramicidin] Itching and Rash  ? ? ?Chief Complaint  ?Patient presents with  ? Medical Management of Chronic Issues ? ?                     CKD stage 3 due to type 2 diabetes mellitus:. Diabetic peripheral neuropathy associated with type 2 diabetes mellitus:   Type 2 diabetes mellitus with stage 3 chronic kidney disease without long term current use of insulin    Other chronic gastritis without hemorrhage:  ? ? ?HPI: ? ?She is a 76 year old long term resident of this facility being seen for the management of her chronic illnesses: CKD stage 3 due to type 2 diabetes mellitus:. Diabetic peripheral neuropathy associated with type 2 diabetes mellitus:   Type 2 diabetes mellitus with stage 3 chronic kidney disease without long term current use of insulin    Other chronic gastritis without hemorrhage. There are no reports of uncontrolled pain; no changes in appetite; no reports of anxiety.  ? ?Past Medical History:  ?Diagnosis Date  ? Allergy   ? Anemia   ? pernicious anemia  ? Arthritis   ? Asthma   ? Blood transfusion without reported diagnosis   ? CKD (chronic kidney disease)   ? COPD (chronic obstructive pulmonary disease) ([place]) 12/23/2019  ? Diabetic peripheral neuropathy associated with type 2 diabetes mellitus ([place]) 04/07/2021  ? Gastritis   ? GERD (gastroesophageal reflux disease)   ? Glaucoma   ? History of blood in urine   ? Hyperlipidemia   ? Hypertension   ? Neuropathy   ? both feet  ? Ocular hypertension   ? Osteoporosis   ? Stroke [facility])   ? Type 2  diabetes mellitus ([place]) 12/23/2019  ? Vitamin B12 deficiency anemia due to intrinsic factor deficiency   ? ? ?Past Surgical History:  ?Procedure Laterality Date  ? CHOLECYSTECTOMY  2008  ? COLONOSCOPY  04/30/2011  ? Procedure: COLONOSCOPY;  Surgeon: Rogene Houston, MD;  [facility];  Service: Endoscopy;  Laterality: N/A;  ? COLONOSCOPY  05/25/2012  ? Procedure: COLONOSCOPY;  Surgeon: Rogene Houston, MD;  [facility];  Service: Endoscopy;  Laterality: N/A;  1:25-changed to 1200 Ann to notify pt  ? COLONOSCOPY Bilateral 12/2017  ? COLONOSCOPY WITH PROPOFOL N/A 11/29/2020  ? Procedure: COLONOSCOPY WITH PROPOFOL;  Surgeon: Harvel Quale, MD;  [facility];  Service: Gastroenterology;  Laterality: N/A;  1:35 [facility] is in the [facility]  ? ESOPHAGOGASTRODUODENOSCOPY N/A 12/07/2019  ? Procedure: ESOPHAGOGASTRODUODENOSCOPY (EGD);  Surgeon: Rogene Houston, MD;  [facility];  Service: Endoscopy;  Laterality: N/A;  155  ? GALLBLADDER SURGERY    ? right breast cystectomy  1988  ? TUBAL LIGATION  1975  ? ? ?Social History  ? ?Socioeconomic History  ? Marital status: Divorced  ?  Spouse name: Not on file  ? Number of children: 4  ? Years of education: 44  ? Highest education level: Not  on file  ?Occupational History  ? Occupation: retired   ?Tobacco Use  ? Smoking status: Former  ?  Packs/day: 1.00  ?  Years: 40.00  ?  Pack years: 40.00  ?  Types: Cigarettes  ?  Quit date: 04/03/2011  ?  Years since quitting: 10.6  ? Smokeless tobacco: Never  ?Vaping Use  ? Vaping Use: Never used  ?Substance and Sexual Activity  ? Alcohol use: No  ? Drug use: No  ? Sexual activity: Not Currently  ?Other Topics Concern  ? Not on file  ?Social History Narrative  ? 01/17/20 lives at [facility], [place]  ? ?Social Determinants of Health  ? ?Financial Resource Strain: Not on file  ?Food Insecurity: Not on file  ?Transportation Needs: Not on file  ?Physical Activity: Not on file   ?Stress: Not on file  ?Social Connections: Not on file  ?Intimate Partner Violence: Not on file  ? ?Family History  ?Problem Relation Age of Onset  ? Diabetes Mother   ? Hyperlipidemia Mother   ? Hypertension Mother   ? Heart disease Father   ? Hyperlipidemia Father   ? Diabetes Sister   ? Hyperlipidemia Sister   ? Hypertension Sister   ? Cancer Brother   ? Heart disease Brother   ? Hyperlipidemia Brother   ? Hypertension Brother   ? Diabetes Sister   ? ? ? ? ?VITAL SIGNS ?BP 110/65   Pulse 71   Temp 98.2 ?F (36.8 ?C)   Resp 20   Ht '5\' 4"'$  (1.626 m)   Wt 107 lb 12.8 oz (48.9 kg)   SpO2 95%   BMI 18.50 kg/m?  ? ?Outpatient Encounter Medications as of 11/06/2021  ?Medication Sig  ? acetaminophen (TYLENOL) 325 MG tablet Take 2 tablets (650 mg total) by mouth every 6 (six) hours as needed for mild pain (or Fever >/= 101).  ? albuterol (VENTOLIN HFA) 108 (90 Base) MCG/ACT inhaler Inhale 2 puffs into the lungs every 4 (four) hours as needed for wheezing or shortness of breath.  ? amLODipine (NORVASC) 10 MG tablet Take 1 tablet (10 mg total) by mouth daily.  ? aspirin EC 81 MG tablet Take 81 mg by mouth daily. Swallow whole.  ? Balsam [place]-Castor Oil (VENELEX) OINT Apply 1 application topically as needed. Apply to sacrum and bilateral buttocks Every Shift - PRN; PRN 1, PRN 2, PRN 3  ? benzocaine-menthol (SORE THROAT) 6-10 MG lozenge Take 1 lozenge by mouth as needed for sore throat.  ? cyanocobalamin (,VITAMIN B-12,) 1000 MCG/ML injection Inject 1 mL (1,000 mcg total) into the muscle every 30 (thirty) days.  ? docusate sodium (COLACE) 100 MG capsule Take 1 capsule (100 mg total) by mouth 2 (two) times daily.  ? dorzolamide (TRUSOPT) 2 % ophthalmic solution Place 1 drop into both eyes 2 (two) times daily. (0900 & 1700)  ? ergocalciferol (VITAMIN D2) 1.25 MG (50000 UT) capsule Take 50,000 Units by mouth once a week. Thursday  ? folic acid (FOLVITE) 1 MG tablet Take 1 mg by mouth daily. (0900)  ? gabapentin (NEURONTIN)  100 MG capsule Take 100 mg by mouth at bedtime. (2100)  ? Iron, Ferrous Sulfate, 325 (65 Fe) MG TABS Take by mouth. amt: 1 tab; oral,Once A Day  ? latanoprost (XALATAN) 0.005 % ophthalmic solution Place 1 drop into both eyes at bedtime. (2100)  ? loperamide (IMODIUM A-D) 2 MG tablet Take 2 mg by mouth every 4 (four) hours as needed for diarrhea  or loose stools.  ? losartan (COZAAR) 25 MG tablet Take 25 mg by mouth daily.  ? metoCLOPramide (REGLAN) 5 MG tablet Take 1 tablet (5 mg total) by mouth 3 (three) times daily before meals.  ? NON FORMULARY Diet:Regular thin liquids  ?ALLERGIC TO FISH  ? omeprazole (PRILOSEC) 40 MG capsule Take 40 mg by mouth daily at 6 (six) AM. (0600)  ? ondansetron (ZOFRAN) 4 MG tablet Take 4 mg by mouth every 6 (six) hours as needed for nausea or vomiting.  ? rosuvastatin (CRESTOR) 5 MG tablet Take 5 mg by mouth. Once A Day on Mon, Wed, Fri  ? ?No facility-administered encounter medications on file as of 11/06/2021.  ? ? ? ?SIGNIFICANT DIAGNOSTIC EXAMS ? ?PREVIOUS ? ?05-24-20: dexa: t score -2.387 ? ?NO NEW EXAMS.  ?  ? ?LABS REVIEWED PREVIOUS  ?  ?10-21-20: chol 169; ldl 103; trig 83 hdl 49; hgb a1c 5.0 ?11-07-20: glucose 87; bun 24; creat 1.15; k+ 3.8; na++ 141; ca 9.2 GFR 49; phos 3.9; albumin 3.6; iron 31; TIBC 185; urine micro-albumin 21.9 ?11-19-20: wbc 5.8; hgb 10.1; hct 33.1; mcv 94.0 plt 199  ?01-28-21: wbc 6.9; hgb 11.3; hct 35.6; mcv 93.7 plt 193 ?01-30-21: hgb a1c 5.2 ?02-07-21: wbc 7.1; hgb 11.3; hct 34.6; mcv 92.3 plt 185; glucose 117; bun 24; creat 1.16; k+ 4.2; na++ 134; ca 9.4 GFR 49; phos 3.8; albumin 4.2; vit D 27.34 PTH 22; iron 32; tibc 226 ?02-25-21: vit B 12: 764; folate >100 ?03-25-21: wbc 8.8; hgb 11.4; hct 36.7; mcv 94.6 plt 203  ?04-22-21: wbc 7.5; hgb 11.3; hct 36.5; mcv 93.4 plt 165 ?05-06-21: glucose 91; bun 25; creat 1.17; k+ 4.2; na++ 135; ca 8.9; liver normal albumin 3.9; hgb a1c 5.3; chol 210; ldl 139; trig 66; hdl 58; vit D 41.14 ?05-20-21: wbc 8.4; hgb 12.2; hct  40.3; mcv 95.3 plt 176; vit B 12: 777 iron 56; tibc 215; ferritin 284 folate>100.0  ?06-25-21: glucose 82; bun 31; creat 1.26; k+ 4.6; na++ 138; ca 9.1; GFR 44  ?08-31-21: wbc 6.5; hgb 10.4; hct 32.5; mcv 93.9 plt 181;

## 2021-11-10 ENCOUNTER — Other Ambulatory Visit (HOSPITAL_COMMUNITY)
Admission: RE | Admit: 2021-11-10 | Discharge: 2021-11-10 | Disposition: A | Discharge status: Home / Self Care | Payer: Medicare Other | Source: Skilled Nursing Facility | Attending: Adult Health | Admitting: Adult Health

## 2021-11-10 DIAGNOSIS — E785 Hyperlipidemia, unspecified: Secondary | ICD-10-CM | POA: Insufficient documentation

## 2021-11-10 LAB — LIPID PANEL
Cholesterol: 149 mg/dL (ref 0–200)
HDL: 50 mg/dL (ref >40)
LDL Cholesterol: 83 mg/dL (ref 0–99)
Total CHOL/HDL Ratio: 3 RATIO
Triglycerides: 80 mg/dL (ref <150)
VLDL: 16 mg/dL (ref 0–40)

## 2021-11-11 DIAGNOSIS — M4802 Spinal stenosis, cervical region: Secondary | ICD-10-CM | POA: Diagnosis not present

## 2021-11-11 DIAGNOSIS — Z9181 History of falling: Secondary | ICD-10-CM | POA: Diagnosis not present

## 2021-11-11 DIAGNOSIS — R279 Unspecified lack of coordination: Secondary | ICD-10-CM | POA: Diagnosis not present

## 2021-11-11 DIAGNOSIS — M6281 Muscle weakness (generalized): Secondary | ICD-10-CM | POA: Diagnosis not present

## 2021-11-11 DIAGNOSIS — I1310 Hypertensive heart and chronic kidney disease without heart failure, with stage 1 through stage 4 chronic kidney disease, or unspecified chronic kidney disease: Secondary | ICD-10-CM | POA: Diagnosis not present

## 2021-11-12 ENCOUNTER — Inpatient Hospital Stay (HOSPITAL_COMMUNITY): Payer: Medicare Other | Attending: Hematology

## 2021-11-12 DIAGNOSIS — Z9181 History of falling: Secondary | ICD-10-CM | POA: Diagnosis not present

## 2021-11-12 DIAGNOSIS — M4802 Spinal stenosis, cervical region: Secondary | ICD-10-CM | POA: Diagnosis not present

## 2021-11-12 DIAGNOSIS — I1310 Hypertensive heart and chronic kidney disease without heart failure, with stage 1 through stage 4 chronic kidney disease, or unspecified chronic kidney disease: Secondary | ICD-10-CM | POA: Diagnosis not present

## 2021-11-12 DIAGNOSIS — R279 Unspecified lack of coordination: Secondary | ICD-10-CM | POA: Diagnosis not present

## 2021-11-12 DIAGNOSIS — M6281 Muscle weakness (generalized): Secondary | ICD-10-CM | POA: Diagnosis not present

## 2021-11-13 DIAGNOSIS — R279 Unspecified lack of coordination: Secondary | ICD-10-CM | POA: Diagnosis not present

## 2021-11-13 DIAGNOSIS — I1310 Hypertensive heart and chronic kidney disease without heart failure, with stage 1 through stage 4 chronic kidney disease, or unspecified chronic kidney disease: Secondary | ICD-10-CM | POA: Diagnosis not present

## 2021-11-13 DIAGNOSIS — Z9181 History of falling: Secondary | ICD-10-CM | POA: Diagnosis not present

## 2021-11-13 DIAGNOSIS — M4802 Spinal stenosis, cervical region: Secondary | ICD-10-CM | POA: Diagnosis not present

## 2021-11-13 DIAGNOSIS — M6281 Muscle weakness (generalized): Secondary | ICD-10-CM | POA: Diagnosis not present

## 2021-11-14 DIAGNOSIS — R279 Unspecified lack of coordination: Secondary | ICD-10-CM | POA: Diagnosis not present

## 2021-11-14 DIAGNOSIS — Z9181 History of falling: Secondary | ICD-10-CM | POA: Diagnosis not present

## 2021-11-14 DIAGNOSIS — M6281 Muscle weakness (generalized): Secondary | ICD-10-CM | POA: Diagnosis not present

## 2021-11-14 DIAGNOSIS — M4802 Spinal stenosis, cervical region: Secondary | ICD-10-CM | POA: Diagnosis not present

## 2021-11-14 DIAGNOSIS — I1310 Hypertensive heart and chronic kidney disease without heart failure, with stage 1 through stage 4 chronic kidney disease, or unspecified chronic kidney disease: Secondary | ICD-10-CM | POA: Diagnosis not present

## 2021-11-17 DIAGNOSIS — R279 Unspecified lack of coordination: Secondary | ICD-10-CM | POA: Diagnosis not present

## 2021-11-17 DIAGNOSIS — I1310 Hypertensive heart and chronic kidney disease without heart failure, with stage 1 through stage 4 chronic kidney disease, or unspecified chronic kidney disease: Secondary | ICD-10-CM | POA: Diagnosis not present

## 2021-11-17 DIAGNOSIS — Z9181 History of falling: Secondary | ICD-10-CM | POA: Diagnosis not present

## 2021-11-17 DIAGNOSIS — M4802 Spinal stenosis, cervical region: Secondary | ICD-10-CM | POA: Diagnosis not present

## 2021-11-17 DIAGNOSIS — M6281 Muscle weakness (generalized): Secondary | ICD-10-CM | POA: Diagnosis not present

## 2021-11-18 DIAGNOSIS — Z9181 History of falling: Secondary | ICD-10-CM | POA: Diagnosis not present

## 2021-11-18 DIAGNOSIS — R279 Unspecified lack of coordination: Secondary | ICD-10-CM | POA: Diagnosis not present

## 2021-11-18 DIAGNOSIS — M6281 Muscle weakness (generalized): Secondary | ICD-10-CM | POA: Diagnosis not present

## 2021-11-18 DIAGNOSIS — I1310 Hypertensive heart and chronic kidney disease without heart failure, with stage 1 through stage 4 chronic kidney disease, or unspecified chronic kidney disease: Secondary | ICD-10-CM | POA: Diagnosis not present

## 2021-11-18 DIAGNOSIS — M4802 Spinal stenosis, cervical region: Secondary | ICD-10-CM | POA: Diagnosis not present

## 2021-11-19 ENCOUNTER — Ambulatory Visit (HOSPITAL_COMMUNITY): Payer: Medicare Other | Admitting: Physician Assistant

## 2021-11-19 DIAGNOSIS — R279 Unspecified lack of coordination: Secondary | ICD-10-CM | POA: Diagnosis not present

## 2021-11-19 DIAGNOSIS — Z9181 History of falling: Secondary | ICD-10-CM | POA: Diagnosis not present

## 2021-11-19 DIAGNOSIS — I1310 Hypertensive heart and chronic kidney disease without heart failure, with stage 1 through stage 4 chronic kidney disease, or unspecified chronic kidney disease: Secondary | ICD-10-CM | POA: Diagnosis not present

## 2021-11-19 DIAGNOSIS — M6281 Muscle weakness (generalized): Secondary | ICD-10-CM | POA: Diagnosis not present

## 2021-11-19 DIAGNOSIS — M4802 Spinal stenosis, cervical region: Secondary | ICD-10-CM | POA: Diagnosis not present

## 2021-11-20 DIAGNOSIS — I1310 Hypertensive heart and chronic kidney disease without heart failure, with stage 1 through stage 4 chronic kidney disease, or unspecified chronic kidney disease: Secondary | ICD-10-CM | POA: Diagnosis not present

## 2021-11-20 DIAGNOSIS — M6281 Muscle weakness (generalized): Secondary | ICD-10-CM | POA: Diagnosis not present

## 2021-11-20 DIAGNOSIS — R279 Unspecified lack of coordination: Secondary | ICD-10-CM | POA: Diagnosis not present

## 2021-11-20 DIAGNOSIS — M4802 Spinal stenosis, cervical region: Secondary | ICD-10-CM | POA: Diagnosis not present

## 2021-11-20 DIAGNOSIS — Z9181 History of falling: Secondary | ICD-10-CM | POA: Diagnosis not present

## 2021-11-21 DIAGNOSIS — I1310 Hypertensive heart and chronic kidney disease without heart failure, with stage 1 through stage 4 chronic kidney disease, or unspecified chronic kidney disease: Secondary | ICD-10-CM | POA: Diagnosis not present

## 2021-11-21 DIAGNOSIS — R279 Unspecified lack of coordination: Secondary | ICD-10-CM | POA: Diagnosis not present

## 2021-11-21 DIAGNOSIS — M6281 Muscle weakness (generalized): Secondary | ICD-10-CM | POA: Diagnosis not present

## 2021-11-21 DIAGNOSIS — M4802 Spinal stenosis, cervical region: Secondary | ICD-10-CM | POA: Diagnosis not present

## 2021-11-21 DIAGNOSIS — Z9181 History of falling: Secondary | ICD-10-CM | POA: Diagnosis not present

## 2021-11-25 DIAGNOSIS — I1310 Hypertensive heart and chronic kidney disease without heart failure, with stage 1 through stage 4 chronic kidney disease, or unspecified chronic kidney disease: Secondary | ICD-10-CM | POA: Diagnosis not present

## 2021-11-25 DIAGNOSIS — M6281 Muscle weakness (generalized): Secondary | ICD-10-CM | POA: Diagnosis not present

## 2021-11-25 DIAGNOSIS — Z9181 History of falling: Secondary | ICD-10-CM | POA: Diagnosis not present

## 2021-11-25 DIAGNOSIS — R279 Unspecified lack of coordination: Secondary | ICD-10-CM | POA: Diagnosis not present

## 2021-11-25 DIAGNOSIS — M4802 Spinal stenosis, cervical region: Secondary | ICD-10-CM | POA: Diagnosis not present

## 2021-11-27 DIAGNOSIS — R279 Unspecified lack of coordination: Secondary | ICD-10-CM | POA: Diagnosis not present

## 2021-11-27 DIAGNOSIS — I1310 Hypertensive heart and chronic kidney disease without heart failure, with stage 1 through stage 4 chronic kidney disease, or unspecified chronic kidney disease: Secondary | ICD-10-CM | POA: Diagnosis not present

## 2021-11-27 DIAGNOSIS — M4802 Spinal stenosis, cervical region: Secondary | ICD-10-CM | POA: Diagnosis not present

## 2021-11-27 DIAGNOSIS — M6281 Muscle weakness (generalized): Secondary | ICD-10-CM | POA: Diagnosis not present

## 2021-11-27 DIAGNOSIS — Z9181 History of falling: Secondary | ICD-10-CM | POA: Diagnosis not present

## 2021-11-28 ENCOUNTER — Non-Acute Institutional Stay (SKILLED_NURSING_FACILITY): Payer: Medicare Other | Admitting: Adult Health

## 2021-11-28 ENCOUNTER — Encounter: Payer: Self-pay | Admitting: Adult Health

## 2021-11-28 DIAGNOSIS — R279 Unspecified lack of coordination: Secondary | ICD-10-CM | POA: Diagnosis not present

## 2021-11-28 DIAGNOSIS — M4802 Spinal stenosis, cervical region: Secondary | ICD-10-CM | POA: Diagnosis not present

## 2021-11-28 DIAGNOSIS — J449 Chronic obstructive pulmonary disease, unspecified: Secondary | ICD-10-CM | POA: Diagnosis not present

## 2021-11-28 DIAGNOSIS — M6281 Muscle weakness (generalized): Secondary | ICD-10-CM | POA: Diagnosis not present

## 2021-11-28 DIAGNOSIS — G959 Disease of spinal cord, unspecified: Secondary | ICD-10-CM

## 2021-11-28 DIAGNOSIS — I700 Atherosclerosis of aorta: Secondary | ICD-10-CM

## 2021-11-28 DIAGNOSIS — E440 Moderate protein-calorie malnutrition: Secondary | ICD-10-CM

## 2021-11-28 DIAGNOSIS — Z9181 History of falling: Secondary | ICD-10-CM | POA: Diagnosis not present

## 2021-11-28 DIAGNOSIS — I1310 Hypertensive heart and chronic kidney disease without heart failure, with stage 1 through stage 4 chronic kidney disease, or unspecified chronic kidney disease: Secondary | ICD-10-CM | POA: Diagnosis not present

## 2021-11-28 NOTE — Progress Notes (Signed)
Location:  [facility] [place] Room Number: 150-P Place of Service:  SNF (31)   CODE STATUS: DNR  Allergies  Allergen Reactions   Latex Rash   Levofloxacin Nausea And Vomiting   Neomycin-Bacitracin-Polymyxin  [Bacitracin-Neomycin-Polymyxin] Rash   Other Swelling and Rash    TOMATO  SOUP-PER MAR   Prednisone Hives, Itching, Rash and Other (See Comments)   Fish Allergy     rash   Shellfish Allergy Hives   Neosporin [Neomycin-Polymyxin-Gramicidin] Itching and Rash    Chief Complaint  Patient presents with   Acute Visit    Care plan meeting    HPI:  We have come together for her care plan meeint. Family present  BIMS 15/15 mood 0/30. She uses wheelchair daily; has had no falls. She requires limited to extensive assist with her adls. She is incontinent of bladder and bowel. Dietary: 109 pounds with a gradual weight gain on regular diet.  feeds self. Activities: participates in [place]; and other [place].  Therapy: on PT doing well; walk up to 100-150 feed with contact guard; focusing now on transfers.  She continues to be followed for her chronic illnesses including:  Aortic atherosclerosis Chronic obstructive pulmonary disease unspecified COPD type Cervical myelopathy  Moderate protein calorie malnutrition  Past Medical History:  Diagnosis Date   Allergy    Anemia    pernicious anemia   Arthritis    Asthma    Blood transfusion without reported diagnosis    CKD (chronic kidney disease)    COPD (chronic obstructive pulmonary disease) ([place]) 12/23/2019   Diabetic peripheral neuropathy associated with type 2 diabetes mellitus ([place]) 04/07/2021   Gastritis    GERD (gastroesophageal reflux disease)    Glaucoma    History of blood in urine    Hyperlipidemia    Hypertension    Neuropathy    both feet   Ocular hypertension    Osteoporosis    Stroke ([place])    Type 2 diabetes mellitus ([place]) 12/23/2019   Vitamin B12 deficiency anemia due to intrinsic factor deficiency      Past Surgical History:  Procedure Laterality Date   CHOLECYSTECTOMY  2008   COLONOSCOPY  04/30/2011   Procedure: COLONOSCOPY;  Surgeon: Rogene Houston, MD;  [facility];  Service: Endoscopy;  Laterality: N/A;   COLONOSCOPY  05/25/2012   Procedure: COLONOSCOPY;  Surgeon: Rogene Houston, MD;  [facility];  Service: Endoscopy;  Laterality: N/A;  1:25-changed to 1200 Ann to notify pt   COLONOSCOPY Bilateral 12/2017   COLONOSCOPY WITH PROPOFOL N/A 11/29/2020   Procedure: COLONOSCOPY WITH PROPOFOL;  Surgeon: Harvel Quale, MD;  [facility];  Service: Gastroenterology;  Laterality: N/A;  1:35 [facility] is in the [facility]   ESOPHAGOGASTRODUODENOSCOPY N/A 12/07/2019   Procedure: ESOPHAGOGASTRODUODENOSCOPY (EGD);  Surgeon: Rogene Houston, MD;  [facility];  Service: Endoscopy;  Laterality: N/A;  155   GALLBLADDER SURGERY     right breast cystectomy  1988   TUBAL LIGATION  1975    Social History   Socioeconomic History   Marital status: Divorced    Spouse name: Not on file   Number of children: 4   Years of education: 9   Highest education level: Not on file  Occupational History   Occupation: retired   Tobacco Use   Smoking status: Former    Packs/day: 1.00    Years: 40.00    Pack years: 40.00    Types: Cigarettes  Quit date: 04/03/2011    Years since quitting: 10.6   Smokeless tobacco: Never  Vaping Use   Vaping Use: Never used  Substance and Sexual Activity   Alcohol use: No   Drug use: No   Sexual activity: Not Currently  Other Topics Concern   Not on file  Social History Narrative   01/17/20 lives at [facility], [place]   Social Determinants of Health   Financial Resource Strain: Not on file  Food Insecurity: Not on file  Transportation Needs: Not on file  Physical Activity: Not on file  Stress: Not on file  Social Connections: Not on file  Intimate Partner Violence: Not on file   Family  History  Problem Relation Age of Onset   Diabetes Mother    Hyperlipidemia Mother    Hypertension Mother    Heart disease Father    Hyperlipidemia Father    Diabetes Sister    Hyperlipidemia Sister    Hypertension Sister    Cancer Brother    Heart disease Brother    Hyperlipidemia Brother    Hypertension Brother    Diabetes Sister       VITAL SIGNS BP 116/69   Pulse 72   Temp 98.2 F (36.8 C)   Resp 20   Ht '5\' 4"'$  (1.626 m)   Wt 108 lb 8 oz (49.2 kg)   SpO2 95%   BMI 18.62 kg/m   Outpatient Encounter Medications as of 11/28/2021  Medication Sig   acetaminophen (TYLENOL) 325 MG tablet Take 2 tablets (650 mg total) by mouth every 6 (six) hours as needed for mild pain (or Fever >/= 101).   albuterol (VENTOLIN HFA) 108 (90 Base) MCG/ACT inhaler Inhale 2 puffs into the lungs every 4 (four) hours as needed for wheezing or shortness of breath.   amLODipine (NORVASC) 10 MG tablet Take 1 tablet (10 mg total) by mouth daily.   aspirin EC 81 MG tablet Take 81 mg by mouth daily. Swallow whole.   Balsam [place]-Castor Oil (VENELEX) OINT Apply 1 application topically as needed. Apply to sacrum and bilateral buttocks Every Shift - PRN; PRN 1, PRN 2, PRN 3   benzocaine-menthol (SORE THROAT) 6-10 MG lozenge Take 1 lozenge by mouth as needed for sore throat.   cyanocobalamin (,VITAMIN B-12,) 1000 MCG/ML injection Inject 1 mL (1,000 mcg total) into the muscle every 30 (thirty) days.   docusate sodium (COLACE) 100 MG capsule Take 1 capsule (100 mg total) by mouth 2 (two) times daily.   dorzolamide (TRUSOPT) 2 % ophthalmic solution Place 1 drop into both eyes 2 (two) times daily. (0900 & 1700)   ergocalciferol (VITAMIN D2) 1.25 MG (50000 UT) capsule Take 50,000 Units by mouth once a week. Thursday   folic acid (FOLVITE) 1 MG tablet Take 1 mg by mouth daily. (0900)   gabapentin (NEURONTIN) 100 MG capsule Take 100 mg by mouth at bedtime. (2100)   Iron, Ferrous Sulfate, 325 (65 Fe) MG TABS Take by  mouth. amt: 1 tab; oral,Once A Day   latanoprost (XALATAN) 0.005 % ophthalmic solution Place 1 drop into both eyes at bedtime. (2100)   loperamide (IMODIUM A-D) 2 MG tablet Take 2 mg by mouth every 4 (four) hours as needed for diarrhea or loose stools.   losartan (COZAAR) 25 MG tablet Take 25 mg by mouth daily.   metoCLOPramide (REGLAN) 5 MG tablet Take 1 tablet (5 mg total) by mouth 3 (three) times daily before meals.   NON FORMULARY Diet:Regular  thin liquids  ALLERGIC TO FISH   omeprazole (PRILOSEC) 40 MG capsule Take 40 mg by mouth daily at 6 (six) AM. (0600)   ondansetron (ZOFRAN) 4 MG tablet Take 4 mg by mouth every 6 (six) hours as needed for nausea or vomiting.   rosuvastatin (CRESTOR) 5 MG tablet Take 5 mg by mouth. Once A Day on Mon, Wed, Fri   No facility-administered encounter medications on file as of 11/28/2021.     SIGNIFICANT DIAGNOSTIC EXAMS   PREVIOUS  05-24-20: dexa: t score -2.387  NO NEW EXAMS.     LABS REVIEWED PREVIOUS    11-19-20: wbc 5.8; hgb 10.1; hct 33.1; mcv 94.0 plt 199  01-28-21: wbc 6.9; hgb 11.3; hct 35.6; mcv 93.7 plt 193 01-30-21: hgb a1c 5.2 02-07-21: wbc 7.1; hgb 11.3; hct 34.6; mcv 92.3 plt 185; glucose 117; bun 24; creat 1.16; k+ 4.2; na++ 134; ca 9.4 GFR 49; phos 3.8; albumin 4.2; vit D 27.34 PTH 22; iron 32; tibc 226 02-25-21: vit B 12: 764; folate >100 03-25-21: wbc 8.8; hgb 11.4; hct 36.7; mcv 94.6 plt 203  04-22-21: wbc 7.5; hgb 11.3; hct 36.5; mcv 93.4 plt 165 05-06-21: glucose 91; bun 25; creat 1.17; k+ 4.2; na++ 135; ca 8.9; liver normal albumin 3.9; hgb a1c 5.3; chol 210; ldl 139; trig 66; hdl 58; vit D 41.14 05-20-21: wbc 8.4; hgb 12.2; hct 40.3; mcv 95.3 plt 176; vit B 12: 777 iron 56; tibc 215; ferritin 284 folate>100.0  06-25-21: glucose 82; bun 31; creat 1.26; k+ 4.6; na++ 138; ca 9.1; GFR 44  08-31-21: wbc 6.5; hgb 10.4; hct 32.5; mcv 93.9 plt 181; glucose 118; bun 24; creat 1.59; k+ 4.2; na++ 133; ca 9.0; gfr 38 phos 4.7; albumin 3.9   09-22-21: wbc 6.3; hgb 9.2; hct 29.4; mcv 94.8 plt 182; glucose 91; bun 32; creat 1.43; k 4.1; na++ 133; ca 9.0; GFR 38; phos 4.7; albumin 3.9 10-13-21: urine micro-albumin 12.8   NO NEW LABS.   Review of Systems  Constitutional:  Negative for malaise/fatigue.  Respiratory:  Negative for cough and shortness of breath.   Cardiovascular:  Negative for chest pain, palpitations and leg swelling.  Gastrointestinal:  Negative for abdominal pain, constipation and heartburn.  Musculoskeletal:  Negative for back pain, joint pain and myalgias.  Skin: Negative.   Neurological:  Negative for dizziness.  Psychiatric/Behavioral:  The patient is not nervous/anxious.    Physical Exam Constitutional:      General: She is not in acute distress.    Appearance: She is well-developed. She is not diaphoretic.  Neck:     Thyroid: No thyromegaly.  Cardiovascular:     Rate and Rhythm: Normal rate and regular rhythm.     Pulses: Normal pulses.     Heart sounds: Normal heart sounds.  Pulmonary:     Effort: Pulmonary effort is normal. No respiratory distress.     Breath sounds: Normal breath sounds.  Abdominal:     General: Bowel sounds are normal. There is no distension.     Palpations: Abdomen is soft.     Tenderness: There is no abdominal tenderness.  Musculoskeletal:        General: Normal range of motion.     Cervical back: Neck supple.     Right lower leg: No edema.     Left lower leg: No edema.  Lymphadenopathy:     Cervical: No cervical adenopathy.  Skin:    General: Skin is warm and dry.  Neurological:  Mental Status: She is alert and oriented to person, place, and time.  Psychiatric:        Mood and Affect: Mood normal.     ASSESSMENT/ PLAN:  TODAY  Aortic atherosclerosis Chronic obstructive pulmonary disease unspecified COPD type Cervical myelopathy Moderate protein calorie malnutrition  Will continue current medications Will continue current plan of care Will continue to  monitor her status.    Time spent with patient 40 minutes: medications; dietary; plan of care.    Ok Edwards NP [facility]   call [phone]

## 2021-11-29 DIAGNOSIS — M6281 Muscle weakness (generalized): Secondary | ICD-10-CM | POA: Diagnosis not present

## 2021-11-29 DIAGNOSIS — M4802 Spinal stenosis, cervical region: Secondary | ICD-10-CM | POA: Diagnosis not present

## 2021-11-29 DIAGNOSIS — R279 Unspecified lack of coordination: Secondary | ICD-10-CM | POA: Diagnosis not present

## 2021-11-29 DIAGNOSIS — Z9181 History of falling: Secondary | ICD-10-CM | POA: Diagnosis not present

## 2021-11-29 DIAGNOSIS — I1310 Hypertensive heart and chronic kidney disease without heart failure, with stage 1 through stage 4 chronic kidney disease, or unspecified chronic kidney disease: Secondary | ICD-10-CM | POA: Diagnosis not present

## 2021-12-01 DIAGNOSIS — Z9181 History of falling: Secondary | ICD-10-CM | POA: Diagnosis not present

## 2021-12-01 DIAGNOSIS — M6281 Muscle weakness (generalized): Secondary | ICD-10-CM | POA: Diagnosis not present

## 2021-12-01 DIAGNOSIS — I1310 Hypertensive heart and chronic kidney disease without heart failure, with stage 1 through stage 4 chronic kidney disease, or unspecified chronic kidney disease: Secondary | ICD-10-CM | POA: Diagnosis not present

## 2021-12-01 DIAGNOSIS — R279 Unspecified lack of coordination: Secondary | ICD-10-CM | POA: Diagnosis not present

## 2021-12-01 DIAGNOSIS — M4802 Spinal stenosis, cervical region: Secondary | ICD-10-CM | POA: Diagnosis not present

## 2021-12-02 DIAGNOSIS — R279 Unspecified lack of coordination: Secondary | ICD-10-CM | POA: Diagnosis not present

## 2021-12-02 DIAGNOSIS — Z9181 History of falling: Secondary | ICD-10-CM | POA: Diagnosis not present

## 2021-12-02 DIAGNOSIS — I1310 Hypertensive heart and chronic kidney disease without heart failure, with stage 1 through stage 4 chronic kidney disease, or unspecified chronic kidney disease: Secondary | ICD-10-CM | POA: Diagnosis not present

## 2021-12-02 DIAGNOSIS — M6281 Muscle weakness (generalized): Secondary | ICD-10-CM | POA: Diagnosis not present

## 2021-12-02 DIAGNOSIS — M4802 Spinal stenosis, cervical region: Secondary | ICD-10-CM | POA: Diagnosis not present

## 2021-12-03 DIAGNOSIS — R279 Unspecified lack of coordination: Secondary | ICD-10-CM | POA: Diagnosis not present

## 2021-12-03 DIAGNOSIS — M4802 Spinal stenosis, cervical region: Secondary | ICD-10-CM | POA: Diagnosis not present

## 2021-12-03 DIAGNOSIS — M6281 Muscle weakness (generalized): Secondary | ICD-10-CM | POA: Diagnosis not present

## 2021-12-03 DIAGNOSIS — I1310 Hypertensive heart and chronic kidney disease without heart failure, with stage 1 through stage 4 chronic kidney disease, or unspecified chronic kidney disease: Secondary | ICD-10-CM | POA: Diagnosis not present

## 2021-12-03 DIAGNOSIS — Z9181 History of falling: Secondary | ICD-10-CM | POA: Diagnosis not present

## 2021-12-04 ENCOUNTER — Encounter: Payer: Self-pay | Admitting: Internal Medicine

## 2021-12-04 ENCOUNTER — Non-Acute Institutional Stay (SKILLED_NURSING_FACILITY): Payer: Medicare Other | Admitting: Internal Medicine

## 2021-12-04 DIAGNOSIS — E538 Deficiency of other specified B group vitamins: Secondary | ICD-10-CM

## 2021-12-04 DIAGNOSIS — R946 Abnormal results of thyroid function studies: Secondary | ICD-10-CM

## 2021-12-04 DIAGNOSIS — N183 Chronic kidney disease, stage 3 unspecified: Secondary | ICD-10-CM | POA: Diagnosis not present

## 2021-12-04 DIAGNOSIS — D631 Anemia in chronic kidney disease: Secondary | ICD-10-CM | POA: Diagnosis not present

## 2021-12-04 DIAGNOSIS — I700 Atherosclerosis of aorta: Secondary | ICD-10-CM | POA: Diagnosis not present

## 2021-12-04 DIAGNOSIS — I10 Essential (primary) hypertension: Secondary | ICD-10-CM

## 2021-12-04 DIAGNOSIS — E1122 Type 2 diabetes mellitus with diabetic chronic kidney disease: Secondary | ICD-10-CM

## 2021-12-04 NOTE — Progress Notes (Signed)
NURSING HOME LOCATION:  [facility] ROOM NUMBER:  150 P  CODE STATUS:  DNR  PCP: Ok Edwards NP  This is a nursing facility follow up visit of chronic medical diagnoses & to document compliance with Regulation 483.30 (c) in [employer] [place] Manual Phase 2 which mandates caregiver visit ( visits can alternate among physician, PA or NP as per statutes) within 10 days of 30 days / 60 days/ 90 days post admission to SNF date    Interim medical record and care since last SNF visit was updated with review of diagnostic studies and change in clinical status since last visit were documented.  HPI: She is a permanent resident of this facility with diagnosis of pernicious anemia, history of asthma, COPD, diabetes complicated by peripheral neuropathy and CKD, GERD with history of gastritis, essential hypertension, dyslipidemia, and osteoporosis. She is a former smoker with a 40-pack-year history.  Current LDL is 83 with a total cholesterol 149.  LDL is 83 on low-dose Crestor; in October 2022 it was 139.  She does have a history of aortic atherosclerosis.  HDL is at goal with a value of 50.  Chemistry and electrolytes revealed creatinine 1.43 with a GFR 38 indicating CKD stage IIIb.  There has been slight improvement in renal function as prior creatinine was 1.59 with GFR 33. Most recent A1c was prediabetic at 5.3% in October 2022. In 2021 she had exhibited mild hypothyroidism with TSH as high as 7.150.  The most recent TSH was 2.291 on 09/05/2020. Serially anemia has stabilized with the most recent hemoglobin of 11.1 on 10/23/2021.  On 3/13 H/H had been 9.2/29.4.  Despite history of pernicious anemia; indices were normochromic, normocytic. B12 level was normal at 728 on 08/13/2021.  Review of systems: She states that she is "doing pretty good".  She states that she has "gained a pound or 2".  Constipation has been addressed by Colace daily.  She does continue to validate cold  intolerance.  She has numbness and tingling in her feet and hands.  Constitutional: No fever, significant weight change, fatigue  Eyes: No redness, discharge, pain, vision change ENT/mouth: No nasal congestion,  purulent discharge, earache, change in hearing, sore throat  Cardiovascular: No chest pain, palpitations, paroxysmal nocturnal dyspnea, claudication, edema  Respiratory: No cough, sputum production, hemoptysis, DOE, significant snoring, apnea   Gastrointestinal: No heartburn, dysphagia, abdominal pain, nausea /vomiting, rectal bleeding, melena, change in bowels Genitourinary: No dysuria, hematuria, pyuria, incontinence, nocturia Musculoskeletal: No joint stiffness, joint swelling, weakness, pain Dermatologic: No rash, pruritus, change in appearance of skin Neurologic: No dizziness, headache, syncope, seizures Psychiatric: No significant anxiety, depression, insomnia, anorexia Endocrine: No change in hair/skin/nails, excessive thirst, excessive hunger, excessive urination  Hematologic/lymphatic: No significant bruising, lymphadenopathy, abnormal bleeding Allergy/immunology: No itchy/watery eyes, significant sneezing, urticaria, angioedema  Physical exam:  Pertinent or positive findings: She is thin.  The cold intolerance is obvious as she is wearing mittens , sweater , booties,and has a blanket across her lap.  She is alert and communicative.  The lacrimal glands are prominent especially on the right.  She is edentulous.  There is minor chin hirsutism.  Second heart sound is increased.  Pedal pulses are decreased.  Deep tendon reflexes are 0-1/2+.  Limbs are atrophic.  General appearance: no acute distress, increased work of breathing is present.   Lymphatic: No lymphadenopathy about the head, neck, axilla. Eyes: No conjunctival inflammation or lid edema is present. There is no scleral  icterus. Ears:  External ear exam shows no significant lesions or deformities.   Nose:  External  nasal examination shows no deformity or inflammation. Nasal mucosa are pink and moist without lesions, exudates Oral exam:  Lips and gums are healthy appearing. There is no oropharyngeal erythema or exudate. Neck:  No thyromegaly, masses, tenderness noted.    Heart:  Normal rate and regular rhythm. S1 normal without gallop, murmur, click, rub .  Lungs: Chest clear to auscultation without wheezes, rhonchi, rales, rubs. Abdomen: Bowel sounds are normal. Abdomen is soft and nontender with no organomegaly, hernias, masses. GU: Deferred  Extremities:  No cyanosis, clubbing, edema  Neurologic exam :Balance, Rhomberg, finger to nose testing could not be completed due to clinical state Skin: Warm & dry w/o tenting. No significant lesions or rash.  See summary under each active problem in the Problem List with associated updated therapeutic plan

## 2021-12-05 DIAGNOSIS — R279 Unspecified lack of coordination: Secondary | ICD-10-CM | POA: Diagnosis not present

## 2021-12-05 DIAGNOSIS — I1310 Hypertensive heart and chronic kidney disease without heart failure, with stage 1 through stage 4 chronic kidney disease, or unspecified chronic kidney disease: Secondary | ICD-10-CM | POA: Diagnosis not present

## 2021-12-05 DIAGNOSIS — M4802 Spinal stenosis, cervical region: Secondary | ICD-10-CM | POA: Diagnosis not present

## 2021-12-05 DIAGNOSIS — M6281 Muscle weakness (generalized): Secondary | ICD-10-CM | POA: Diagnosis not present

## 2021-12-05 DIAGNOSIS — Z9181 History of falling: Secondary | ICD-10-CM | POA: Diagnosis not present

## 2021-12-05 NOTE — Patient Instructions (Signed)
See assessment and plan under each diagnosis in the problem list and acutely for this visit 

## 2021-12-05 NOTE — Assessment & Plan Note (Signed)
Current hemoglobin reveals significant improvement with value of 11.1 up from prior value of 9.2.

## 2021-12-05 NOTE — Assessment & Plan Note (Signed)
BP controlled; no change in antihypertensive medications  

## 2021-12-05 NOTE — Assessment & Plan Note (Signed)
Renal function is stable to improved with current creatinine 1.43 and GFR 38.  Prior values were 1.59 and 33.  Medication list reviewed; no indication for change in meds or dosages unless there is progression of CKD.

## 2021-12-05 NOTE — Assessment & Plan Note (Signed)
Most recent TSH was 2.291 on 09/05/2020.  Discuss updating TSH when fasting lipids are repeated in 8-12 weeks after increase in the Crestor dose.

## 2021-12-05 NOTE — Assessment & Plan Note (Signed)
08/13/2021 B12 level normal at 728.  No change indicated.

## 2021-12-05 NOTE — Assessment & Plan Note (Addendum)
She denies angina or angina equivalents. LDL is 83 on low-dose Crestor but is improved from prior values of 139.  Discussed increasing the Crestor dose with NP.

## 2021-12-06 DIAGNOSIS — M6281 Muscle weakness (generalized): Secondary | ICD-10-CM | POA: Diagnosis not present

## 2021-12-06 DIAGNOSIS — Z9181 History of falling: Secondary | ICD-10-CM | POA: Diagnosis not present

## 2021-12-06 DIAGNOSIS — M4802 Spinal stenosis, cervical region: Secondary | ICD-10-CM | POA: Diagnosis not present

## 2021-12-06 DIAGNOSIS — I1310 Hypertensive heart and chronic kidney disease without heart failure, with stage 1 through stage 4 chronic kidney disease, or unspecified chronic kidney disease: Secondary | ICD-10-CM | POA: Diagnosis not present

## 2021-12-06 DIAGNOSIS — R279 Unspecified lack of coordination: Secondary | ICD-10-CM | POA: Diagnosis not present

## 2021-12-08 DIAGNOSIS — I1310 Hypertensive heart and chronic kidney disease without heart failure, with stage 1 through stage 4 chronic kidney disease, or unspecified chronic kidney disease: Secondary | ICD-10-CM | POA: Diagnosis not present

## 2021-12-08 DIAGNOSIS — R279 Unspecified lack of coordination: Secondary | ICD-10-CM | POA: Diagnosis not present

## 2021-12-08 DIAGNOSIS — M4802 Spinal stenosis, cervical region: Secondary | ICD-10-CM | POA: Diagnosis not present

## 2021-12-08 DIAGNOSIS — M6281 Muscle weakness (generalized): Secondary | ICD-10-CM | POA: Diagnosis not present

## 2021-12-08 DIAGNOSIS — Z9181 History of falling: Secondary | ICD-10-CM | POA: Diagnosis not present

## 2021-12-09 DIAGNOSIS — R279 Unspecified lack of coordination: Secondary | ICD-10-CM | POA: Diagnosis not present

## 2021-12-09 DIAGNOSIS — I1310 Hypertensive heart and chronic kidney disease without heart failure, with stage 1 through stage 4 chronic kidney disease, or unspecified chronic kidney disease: Secondary | ICD-10-CM | POA: Diagnosis not present

## 2021-12-09 DIAGNOSIS — M4802 Spinal stenosis, cervical region: Secondary | ICD-10-CM | POA: Diagnosis not present

## 2021-12-09 DIAGNOSIS — Z9181 History of falling: Secondary | ICD-10-CM | POA: Diagnosis not present

## 2021-12-09 DIAGNOSIS — M6281 Muscle weakness (generalized): Secondary | ICD-10-CM | POA: Diagnosis not present

## 2021-12-10 DIAGNOSIS — I1310 Hypertensive heart and chronic kidney disease without heart failure, with stage 1 through stage 4 chronic kidney disease, or unspecified chronic kidney disease: Secondary | ICD-10-CM | POA: Diagnosis not present

## 2021-12-10 DIAGNOSIS — R279 Unspecified lack of coordination: Secondary | ICD-10-CM | POA: Diagnosis not present

## 2021-12-10 DIAGNOSIS — Z9181 History of falling: Secondary | ICD-10-CM | POA: Diagnosis not present

## 2021-12-10 DIAGNOSIS — M4802 Spinal stenosis, cervical region: Secondary | ICD-10-CM | POA: Diagnosis not present

## 2021-12-10 DIAGNOSIS — M6281 Muscle weakness (generalized): Secondary | ICD-10-CM | POA: Diagnosis not present

## 2021-12-17 DIAGNOSIS — H2513 Age-related nuclear cataract, bilateral: Secondary | ICD-10-CM | POA: Diagnosis not present

## 2021-12-17 DIAGNOSIS — H401134 Primary open-angle glaucoma, bilateral, indeterminate stage: Secondary | ICD-10-CM | POA: Diagnosis not present

## 2021-12-17 LAB — HM DIABETES EYE EXAM

## 2021-12-30 ENCOUNTER — Non-Acute Institutional Stay (SKILLED_NURSING_FACILITY): Payer: Medicare Other | Admitting: Adult Health

## 2021-12-30 ENCOUNTER — Encounter: Payer: Self-pay | Admitting: Adult Health

## 2021-12-30 DIAGNOSIS — I129 Hypertensive chronic kidney disease with stage 1 through stage 4 chronic kidney disease, or unspecified chronic kidney disease: Secondary | ICD-10-CM

## 2021-12-30 DIAGNOSIS — D631 Anemia in chronic kidney disease: Secondary | ICD-10-CM | POA: Diagnosis not present

## 2021-12-30 DIAGNOSIS — H40053 Ocular hypertension, bilateral: Secondary | ICD-10-CM

## 2021-12-30 DIAGNOSIS — E1122 Type 2 diabetes mellitus with diabetic chronic kidney disease: Secondary | ICD-10-CM

## 2021-12-30 DIAGNOSIS — F339 Major depressive disorder, recurrent, unspecified: Secondary | ICD-10-CM

## 2021-12-30 DIAGNOSIS — N183 Chronic kidney disease, stage 3 unspecified: Secondary | ICD-10-CM | POA: Diagnosis not present

## 2021-12-30 NOTE — Progress Notes (Addendum)
Location:  [facility] Room Number: 150 Place of Service:  SNF (31)   CODE STATUS: DNR  Allergies  Allergen Reactions   Latex Rash   Levofloxacin Nausea And Vomiting   Neomycin-Bacitracin-Polymyxin  [Bacitracin-Neomycin-Polymyxin] Rash   Other Swelling and Rash    TOMATO  SOUP-PER MAR   Prednisone Hives, Itching, Rash and Other (See Comments)   Fish Allergy     rash   Shellfish Allergy Hives   Neosporin [Neomycin-Polymyxin-Gramicidin] Itching and Rash    Chief Complaint  Patient presents with   Medical Management of Chronic Issues                     Hypertension associated with stage 3 chronic kidney disease due to type 2 diabetes mellitus: Anemia due to chronic renal failure treated with erythropoietin stage 3: Increased intraocular pressure bilateral:Vitamin B 12 deficiency     HPI:  She is a 76 year old long term resident of this facility being seen for the management of her chronic illnesses:  Hypertension associated with stage 3 chronic kidney disease due to type 2 diabetes mellitus: Anemia due to chronic renal failure treated with erythropoietin stage 3: Increased intraocular pressure bilateral:Vitamin B 12 deficiency level is 777 folate >100; will continue monthly injections and folic acid 1 mg daily. She continues to get out of bed daily. Her weight has been stable. There are no reports of uncontrolled pain.   Past Medical History:  Diagnosis Date   Allergy    Anemia    pernicious anemia   Arthritis    Asthma    Blood transfusion without reported diagnosis    CKD (chronic kidney disease)    COPD (chronic obstructive pulmonary disease) ([place]) 12/23/2019   Diabetic peripheral neuropathy associated with type 2 diabetes mellitus ([place]) 04/07/2021   Gastritis    GERD (gastroesophageal reflux disease)    Glaucoma    History of blood in urine    Hyperlipidemia    Hypertension    Neuropathy    both feet   Ocular hypertension    Osteoporosis     Stroke ([place])    Type 2 diabetes mellitus ([place]) 12/23/2019   Vitamin B12 deficiency anemia due to intrinsic factor deficiency     Past Surgical History:  Procedure Laterality Date   CHOLECYSTECTOMY  2008   COLONOSCOPY  04/30/2011   Procedure: COLONOSCOPY;  Surgeon: Rogene Houston, MD;  [facility];  Service: Endoscopy;  Laterality: N/A;   COLONOSCOPY  05/25/2012   Procedure: COLONOSCOPY;  Surgeon: Rogene Houston, MD;  [facility];  Service: Endoscopy;  Laterality: N/A;  1:25-changed to 1200 Ann to notify pt   COLONOSCOPY Bilateral 12/2017   COLONOSCOPY WITH PROPOFOL N/A 11/29/2020   Procedure: COLONOSCOPY WITH PROPOFOL;  Surgeon: Harvel Quale, MD;  [facility];  Service: Gastroenterology;  Laterality: N/A;  1:35 [facility] is in the [facility]   ESOPHAGOGASTRODUODENOSCOPY N/A 12/07/2019   Procedure: ESOPHAGOGASTRODUODENOSCOPY (EGD);  Surgeon: Rogene Houston, MD;  [facility];  Service: Endoscopy;  Laterality: N/A;  155   GALLBLADDER SURGERY     right breast cystectomy  1988   TUBAL LIGATION  1975    Social History   Socioeconomic History   Marital status: Divorced    Spouse name: Not on file   Number of children: 4   Years of education: 9   Highest education level: Not on file  Occupational History  Occupation: retired   Tobacco Use   Smoking status: Former    Packs/day: 1.00    Years: 40.00    Total pack years: 40.00    Types: Cigarettes    Quit date: 04/03/2011    Years since quitting: 10.7   Smokeless tobacco: Never  Vaping Use   Vaping Use: Never used  Substance and Sexual Activity   Alcohol use: No   Drug use: No   Sexual activity: Not Currently  Other Topics Concern   Not on file  Social History Narrative   01/17/20 lives at [facility], [place]   Social Determinants of Health   Financial Resource Strain: Not on file  Food Insecurity: Not on file  Transportation Needs: Not on file  Physical  Activity: Not on file  Stress: Not on file  Social Connections: Not on file  Intimate Partner Violence: Not on file   Family History  Problem Relation Age of Onset   Diabetes Mother    Hyperlipidemia Mother    Hypertension Mother    Heart disease Father    Hyperlipidemia Father    Diabetes Sister    Hyperlipidemia Sister    Hypertension Sister    Cancer Brother    Heart disease Brother    Hyperlipidemia Brother    Hypertension Brother    Diabetes Sister       VITAL SIGNS BP 131/76   Pulse 69   Temp 97.7 F (36.5 C)   Resp (!) 22   Ht '5\' 4"'$  (1.626 m)   Wt 109 lb 3.2 oz (49.5 kg)   SpO2 95%   BMI 18.74 kg/m   Outpatient Encounter Medications as of 12/30/2021  Medication Sig   acetaminophen (TYLENOL) 325 MG tablet Take 2 tablets (650 mg total) by mouth every 6 (six) hours as needed for mild pain (or Fever >/= 101).   albuterol (VENTOLIN HFA) 108 (90 Base) MCG/ACT inhaler Inhale 2 puffs into the lungs every 4 (four) hours as needed for wheezing or shortness of breath.   amLODipine (NORVASC) 10 MG tablet Take 1 tablet (10 mg total) by mouth daily.   aspirin EC 81 MG tablet Take 81 mg by mouth daily. Swallow whole.   Balsam [place]-Castor Oil (VENELEX) OINT Apply 1 application topically as needed. Apply to sacrum and bilateral buttocks Every Shift - PRN; PRN 1, PRN 2, PRN 3   benzocaine-menthol (SORE THROAT) 6-10 MG lozenge Take 1 lozenge by mouth as needed for sore throat.   cyanocobalamin (,VITAMIN B-12,) 1000 MCG/ML injection Inject 1 mL (1,000 mcg total) into the muscle every 30 (thirty) days.   docusate sodium (COLACE) 100 MG capsule Take 1 capsule (100 mg total) by mouth 2 (two) times daily.   dorzolamide (TRUSOPT) 2 % ophthalmic solution Place 1 drop into both eyes 2 (two) times daily. (0900 & 1700)   ergocalciferol (VITAMIN D2) 1.25 MG (50000 UT) capsule Take 50,000 Units by mouth once a week. Thursday   folic acid (FOLVITE) 1 MG tablet Take 1 mg by mouth daily. (0900)    gabapentin (NEURONTIN) 100 MG capsule Take 100 mg by mouth at bedtime. (2100)   Iron, Ferrous Sulfate, 325 (65 Fe) MG TABS Take by mouth. amt: 1 tab; oral,Once A Day   latanoprost (XALATAN) 0.005 % ophthalmic solution Place 1 drop into both eyes at bedtime. (2100)   loperamide (IMODIUM A-D) 2 MG tablet Take 2 mg by mouth every 4 (four) hours as needed for diarrhea or loose stools.  losartan (COZAAR) 25 MG tablet Take 25 mg by mouth daily.   metoCLOPramide (REGLAN) 5 MG tablet Take 1 tablet (5 mg total) by mouth 3 (three) times daily before meals.   Netarsudil-Latanoprost (ROCKLATAN) 0.02-0.005 % SOLN Apply 1 drop to eye every evening.   NON FORMULARY Diet:Regular thin liquids  ALLERGIC TO FISH   omeprazole (PRILOSEC) 40 MG capsule Take 40 mg by mouth daily at 6 (six) AM. (0600)   ondansetron (ZOFRAN) 4 MG tablet Take 4 mg by mouth every 6 (six) hours as needed for nausea or vomiting.   rosuvastatin (CRESTOR) 10 MG tablet Take 5 mg by mouth daily. Once A Day on Mon, Wed, Fri   No facility-administered encounter medications on file as of 12/30/2021.     SIGNIFICANT DIAGNOSTIC EXAMS  PREVIOUS  05-24-20: dexa: t score -2.387  NO NEW EXAMS.     LABS REVIEWED PREVIOUS    01-28-21: wbc 6.9; hgb 11.3; hct 35.6; mcv 93.7 plt 193 01-30-21: hgb a1c 5.2 02-07-21: wbc 7.1; hgb 11.3; hct 34.6; mcv 92.3 plt 185; glucose 117; bun 24; creat 1.16; k+ 4.2; na++ 134; ca 9.4 GFR 49; phos 3.8; albumin 4.2; vit D 27.34 PTH 22; iron 32; tibc 226 02-25-21: vit B 12: 764; folate >100 03-25-21: wbc 8.8; hgb 11.4; hct 36.7; mcv 94.6 plt 203  04-22-21: wbc 7.5; hgb 11.3; hct 36.5; mcv 93.4 plt 165 05-06-21: glucose 91; bun 25; creat 1.17; k+ 4.2; na++ 135; ca 8.9; liver normal albumin 3.9; hgb a1c 5.3; chol 210; ldl 139; trig 66; hdl 58; vit D 41.14 05-20-21: wbc 8.4; hgb 12.2; hct 40.3; mcv 95.3 plt 176; vit B 12: 777 iron 56; tibc 215; ferritin 284 folate>100.0  06-25-21: glucose 82; bun 31; creat 1.26; k+  4.6; na++ 138; ca 9.1; GFR 44  08-31-21: wbc 6.5; hgb 10.4; hct 32.5; mcv 93.9 plt 181; glucose 118; bun 24; creat 1.59; k+ 4.2; na++ 133; ca 9.0; gfr 38 phos 4.7; albumin 3.9  09-22-21: wbc 6.3; hgb 9.2; hct 29.4; mcv 94.8 plt 182; glucose 91; bun 32; creat 1.43; k 4.1; na++ 133; ca 9.0; GFR 38; phos 4.7; albumin 3.9 10-13-21: urine micro-albumin 12.8  TODAY  11-10-21: chol 149; ldl 83; trig 80 hdl 50   Review of Systems  Constitutional:  Negative for malaise/fatigue.  Respiratory:  Negative for cough and shortness of breath.   Cardiovascular:  Negative for chest pain, palpitations and leg swelling.  Gastrointestinal:  Negative for abdominal pain, constipation and heartburn.  Musculoskeletal:  Negative for back pain, joint pain and myalgias.  Skin: Negative.   Neurological:  Negative for dizziness.  Psychiatric/Behavioral:  The patient is not nervous/anxious.    Physical Exam Constitutional:      General: She is not in acute distress.    Appearance: She is well-developed. She is not diaphoretic.  Neck:     Thyroid: No thyromegaly.  Cardiovascular:     Rate and Rhythm: Normal rate and regular rhythm.     Pulses: Normal pulses.     Heart sounds: Normal heart sounds.  Pulmonary:     Effort: Pulmonary effort is normal. No respiratory distress.     Breath sounds: Normal breath sounds.  Abdominal:     General: Bowel sounds are normal. There is no distension.     Palpations: Abdomen is soft.     Tenderness: There is no abdominal tenderness.  Musculoskeletal:        General: Normal range of motion.  Cervical back: Neck supple.     Right lower leg: No edema.     Left lower leg: No edema.  Lymphadenopathy:     Cervical: No cervical adenopathy.  Skin:    General: Skin is warm and dry.  Neurological:     Mental Status: She is alert and oriented to person, place, and time.  Psychiatric:        Mood and Affect: Mood normal.        ASSESSMENT/ PLAN:  TODAY    Hypertension  associated with stage 3 chronic kidney disease due to type 2 diabetes mellitus: b/p 131/76 will continue norvasc 10 mg daily   2. Anemia due to chronic renal failure treated with erythropoietin stage 3: hgb 12.2 followed by oncology  3. Increased intraocular pressure bilateral: will continue trusopt to both eyes twice daly and xalatan to both eyes nightly  4. Vitamin B 12 deficiency level is 777 folate >100; will continue monthly injections and folic acid 1 mg daily     PREVIOUS   5. Vitamin D deficiency is stable level is 41.14 will continue vitamin D 50,000 units weekly   6. Thyroiditis: is stable tsh 2.291 free t4: 0.96  7.  Cervical myelopathy: is stable will continue to monitor   8. Failure to thrive in adult: weight is 109 pounds; will continue supplements as ordered; no megace due to DVT  9. Aortic atherosclerosis: (ct 12-23-19): will monitor   10. 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 Chronic obstructive pulmonary disease unspecified COPD type: will continue albuterol 2 puffs every 4 hours as needed    12. CKD stage 3 due to type 2 diabetes mellitus: bun 32; creat 1.43; gfr 38   13. Diabetic peripheral neuropathy associated with type 2 diabetes mellitus: will continue gabapentin 100 mg nightly  14. Type 2 diabetes mellitus with stage 3 chronic kidney disease without long term current use of insulin hgb a1c 5.3; on asa 81 mg daily   15. Other chronic gastritis without hemorrhage: will continue prilosec 40 mg dial reglan 5 mg prior to meals.    Ok Edwards NP [facility]   call [phone]

## 2022-01-01 ENCOUNTER — Other Ambulatory Visit (HOSPITAL_COMMUNITY)
Admission: RE | Admit: 2022-01-01 | Discharge: 2022-01-01 | Disposition: A | Discharge status: Home / Self Care | Payer: Medicare Other | Source: Skilled Nursing Facility | Attending: Adult Health | Admitting: Adult Health

## 2022-01-01 DIAGNOSIS — E1142 Type 2 diabetes mellitus with diabetic polyneuropathy: Secondary | ICD-10-CM | POA: Diagnosis not present

## 2022-01-01 DIAGNOSIS — F339 Major depressive disorder, recurrent, unspecified: Secondary | ICD-10-CM | POA: Insufficient documentation

## 2022-01-01 LAB — HEMOGLOBIN A1C
Hgb A1c MFr Bld: 5.4 % (ref 4.8–5.6)
Mean Plasma Glucose: 108.28 mg/dL

## 2022-01-14 ENCOUNTER — Other Ambulatory Visit (HOSPITAL_COMMUNITY)
Admission: RE | Admit: 2022-01-14 | Discharge: 2022-01-14 | Disposition: A | Discharge status: Home / Self Care | Payer: Medicare Other | Source: Skilled Nursing Facility | Attending: Adult Health | Admitting: Adult Health

## 2022-01-14 DIAGNOSIS — R944 Abnormal results of kidney function studies: Secondary | ICD-10-CM | POA: Insufficient documentation

## 2022-01-14 LAB — PROTEIN / CREATININE RATIO, URINE
Creatinine, Urine: 45.8 mg/dL
Protein Creatinine Ratio: 0.35 mg/mg{Cre} — ABNORMAL HIGH (ref 0.00–0.15)
Total Protein, Urine: 16 mg/dL

## 2022-01-14 LAB — RENAL FUNCTION PANEL
Albumin: 3.7 g/dL (ref 3.5–5.0)
Anion gap: 9 (ref 5–15)
BUN: 17 mg/dL (ref 8–23)
CO2: 26 mmol/L (ref 22–32)
Calcium: 8.8 mg/dL — ABNORMAL LOW (ref 8.9–10.3)
Chloride: 93 mmol/L — ABNORMAL LOW (ref 98–111)
Creatinine, Ser: 1.14 mg/dL — ABNORMAL HIGH (ref 0.44–1.00)
GFR, Estimated: 50 mL/min — ABNORMAL LOW (ref >60)
Glucose, Bld: 101 mg/dL — ABNORMAL HIGH (ref 70–99)
Phosphorus: 3.5 mg/dL (ref 2.5–4.6)
Potassium: 4.4 mmol/L (ref 3.5–5.1)
Sodium: 128 mmol/L — ABNORMAL LOW (ref 135–145)

## 2022-01-14 LAB — CBC
HCT: 30.8 % — ABNORMAL LOW (ref 36.0–46.0)
Hemoglobin: 10.2 g/dL — ABNORMAL LOW (ref 12.0–15.0)
MCH: 29.7 pg (ref 26.0–34.0)
MCHC: 33.1 g/dL (ref 30.0–36.0)
MCV: 89.8 fL (ref 80.0–100.0)
Platelets: 149 10*3/uL — ABNORMAL LOW (ref 150–400)
RBC: 3.43 MIL/uL — ABNORMAL LOW (ref 3.87–5.11)
RDW: 13.2 % (ref 11.5–15.5)
WBC: 5.8 10*3/uL (ref 4.0–10.5)
nRBC: 0 % (ref 0.0–0.2)

## 2022-01-16 ENCOUNTER — Encounter: Payer: Self-pay | Admitting: Adult Health

## 2022-01-16 ENCOUNTER — Non-Acute Institutional Stay (SKILLED_NURSING_FACILITY): Payer: Medicare Other | Admitting: Adult Health

## 2022-01-16 DIAGNOSIS — E871 Hypo-osmolality and hyponatremia: Secondary | ICD-10-CM

## 2022-01-16 DIAGNOSIS — J189 Pneumonia, unspecified organism: Secondary | ICD-10-CM | POA: Diagnosis not present

## 2022-01-16 NOTE — Progress Notes (Signed)
Location:  [facility] Room Number: 150-P Place of Service:  SNF (31)   CODE STATUS: DNR  Allergies  Allergen Reactions   Latex Rash   Levofloxacin Nausea And Vomiting   Neomycin-Bacitracin-Polymyxin  [Bacitracin-Neomycin-Polymyxin] Rash   Other Swelling and Rash    TOMATO  SOUP-PER MAR   Prednisone Hives, Itching, Rash and Other (See Comments)   Fish Allergy     rash   Shellfish Allergy Hives   Neosporin [Neomycin-Polymyxin-Gramicidin] Itching and Rash    Chief Complaint  Patient presents with   Acute Visit    Cough     HPI:  Her family is concerned about her lab results. Her na++ is 128; hgb 10. 2 plt 149. [name] has a nephrologist appointment in late July. She will need to be seen sooner; facility will work on closer appointment on Monday.  She is having a cough for the past 4 days. Robitussin has not been effective. She sore throat has resolved. She does sound nasally. The cough is not productive. She denies any shortness of breath. There are no reports of fevers present.   Past Medical History:  Diagnosis Date   Allergy    Anemia    pernicious anemia   Arthritis    Asthma    Blood transfusion without reported diagnosis    CKD (chronic kidney disease)    COPD (chronic obstructive pulmonary disease) ([place]) 12/23/2019   Diabetic peripheral neuropathy associated with type 2 diabetes mellitus ([place]) 04/07/2021   Gastritis    GERD (gastroesophageal reflux disease)    Glaucoma    History of blood in urine    Hyperlipidemia    Hypertension    Neuropathy    both feet   Ocular hypertension    Osteoporosis    Stroke ([place])    Type 2 diabetes mellitus ([place]) 12/23/2019   Vitamin B12 deficiency anemia due to intrinsic factor deficiency     Past Surgical History:  Procedure Laterality Date   CHOLECYSTECTOMY  2008   COLONOSCOPY  04/30/2011   Procedure: COLONOSCOPY;  Surgeon: Rogene Houston, MD;  [facility];  Service: Endoscopy;   Laterality: N/A;   COLONOSCOPY  05/25/2012   Procedure: COLONOSCOPY;  Surgeon: Rogene Houston, MD;  [facility];  Service: Endoscopy;  Laterality: N/A;  1:25-changed to 1200 Ann to notify pt   COLONOSCOPY Bilateral 12/2017   COLONOSCOPY WITH PROPOFOL N/A 11/29/2020   Procedure: COLONOSCOPY WITH PROPOFOL;  Surgeon: Harvel Quale, MD;  [facility];  Service: Gastroenterology;  Laterality: N/A;  1:35 [facility] is in the [facility]   ESOPHAGOGASTRODUODENOSCOPY N/A 12/07/2019   Procedure: ESOPHAGOGASTRODUODENOSCOPY (EGD);  Surgeon: Rogene Houston, MD;  [facility];  Service: Endoscopy;  Laterality: N/A;  155   GALLBLADDER SURGERY     right breast cystectomy  1988   TUBAL LIGATION  1975    Social History   Socioeconomic History   Marital status: Divorced    Spouse name: Not on file   Number of children: 4   Years of education: 9   Highest education level: Not on file  Occupational History   Occupation: retired   Tobacco Use   Smoking status: Former    Packs/day: 1.00    Years: 40.00    Total pack years: 40.00    Types: Cigarettes    Quit date: 04/03/2011    Years since quitting: 10.7   Smokeless tobacco: Never  Vaping Use  Vaping Use: Never used  Substance and Sexual Activity   Alcohol use: No   Drug use: No   Sexual activity: Not Currently  Other Topics Concern   Not on file  Social History Narrative   01/17/20 lives at [facility], [place]   Social Determinants of Health   Financial Resource Strain: Not on file  Food Insecurity: Not on file  Transportation Needs: Not on file  Physical Activity: Not on file  Stress: Not on file  Social Connections: Not on file  Intimate Partner Violence: Not on file   Family History  Problem Relation Age of Onset   Diabetes Mother    Hyperlipidemia Mother    Hypertension Mother    Heart disease Father    Hyperlipidemia Father    Diabetes Sister    Hyperlipidemia Sister     Hypertension Sister    Cancer Brother    Heart disease Brother    Hyperlipidemia Brother    Hypertension Brother    Diabetes Sister       VITAL SIGNS BP 101/63   Pulse 98   Temp 97.6 F (36.4 C)   Ht '5\' 4"'$  (1.626 m)   Wt 109 lb (49.4 kg)   SpO2 98%   BMI 18.71 kg/m   Outpatient Encounter Medications as of 01/16/2022  Medication Sig   acetaminophen (TYLENOL) 325 MG tablet Take 2 tablets (650 mg total) by mouth every 6 (six) hours as needed for mild pain (or Fever >/= 101).   albuterol (VENTOLIN HFA) 108 (90 Base) MCG/ACT inhaler Inhale 2 puffs into the lungs every 4 (four) hours as needed for wheezing or shortness of breath.   amLODipine (NORVASC) 10 MG tablet Take 1 tablet (10 mg total) by mouth daily.   amoxicillin-clavulanate (AUGMENTIN) 875-125 MG tablet Take 1 tablet by mouth 2 (two) times daily.   aspirin EC 81 MG tablet Take 81 mg by mouth daily. Swallow whole.   Balsam [place]-Castor Oil (VENELEX) OINT Apply 1 application topically as needed. Apply to sacrum and bilateral buttocks Every Shift - PRN; PRN 1, PRN 2, PRN 3   benzocaine-menthol (SORE THROAT) 6-10 MG lozenge Take 1 lozenge by mouth as needed for sore throat.   benzonatate (TESSALON) 100 MG capsule Take 100 mg by mouth every 8 (eight) hours.   cyanocobalamin (,VITAMIN B-12,) 1000 MCG/ML injection Inject 1 mL (1,000 mcg total) into the muscle every 30 (thirty) days.   docusate sodium (COLACE) 100 MG capsule Take 1 capsule (100 mg total) by mouth 2 (two) times daily.   dorzolamide (TRUSOPT) 2 % ophthalmic solution Place 1 drop into both eyes 2 (two) times daily. (0900 & 1700)   ergocalciferol (VITAMIN D2) 1.25 MG (50000 UT) capsule Take 50,000 Units by mouth once a week. Thursday   folic acid (FOLVITE) 1 MG tablet Take 1 mg by mouth daily. (0900)   gabapentin (NEURONTIN) 100 MG capsule Take 100 mg by mouth at bedtime. (2100)   guaiFENesin-dextromethorphan (ROBITUSSIN DM) 100-10 MG/5ML syrup Take 15 mLs by mouth every 6  (six) hours as needed for cough.   Iron, Ferrous Sulfate, 325 (65 Fe) MG TABS Take 1 tablet by mouth daily.   loperamide (IMODIUM A-D) 2 MG tablet Take 2 mg by mouth every 4 (four) hours as needed for diarrhea or loose stools.   losartan (COZAAR) 25 MG tablet Take 25 mg by mouth daily.   metoCLOPramide (REGLAN) 5 MG tablet Take 1 tablet (5 mg total) by mouth 3 (three) times daily  before meals.   Netarsudil-Latanoprost (ROCKLATAN) 0.02-0.005 % SOLN Apply 1 drop to eye every evening.   NON FORMULARY Diet:Regular thin liquids  ALLERGIC TO FISH   omeprazole (PRILOSEC) 40 MG capsule Take 40 mg by mouth daily at 6 (six) AM. (0600)   ondansetron (ZOFRAN) 4 MG tablet Take 4 mg by mouth every 6 (six) hours as needed for nausea or vomiting.   rosuvastatin (CRESTOR) 10 MG tablet Take 5 mg by mouth daily. Once A Day on Mon, Wed, Fri   sodium chloride 1 g tablet Take 1 g by mouth 2 (two) times daily with a meal.   [DISCONTINUED] latanoprost (XALATAN) 0.005 % ophthalmic solution Place 1 drop into both eyes at bedtime. (2100)   No facility-administered encounter medications on file as of 01/16/2022.     SIGNIFICANT DIAGNOSTIC EXAMS  PREVIOUS  05-24-20: dexa: t score -2.387  NO NEW EXAMS.     LABS REVIEWED PREVIOUS    01-28-21: wbc 6.9; hgb 11.3; hct 35.6; mcv 93.7 plt 193 01-30-21: hgb a1c 5.2 02-07-21: wbc 7.1; hgb 11.3; hct 34.6; mcv 92.3 plt 185; glucose 117; bun 24; creat 1.16; k+ 4.2; na++ 134; ca 9.4 GFR 49; phos 3.8; albumin 4.2; vit D 27.34 PTH 22; iron 32; tibc 226 02-25-21: vit B 12: 764; folate >100 03-25-21: wbc 8.8; hgb 11.4; hct 36.7; mcv 94.6 plt 203  04-22-21: wbc 7.5; hgb 11.3; hct 36.5; mcv 93.4 plt 165 05-06-21: glucose 91; bun 25; creat 1.17; k+ 4.2; na++ 135; ca 8.9; liver normal albumin 3.9; hgb a1c 5.3; chol 210; ldl 139; trig 66; hdl 58; vit D 41.14 05-20-21: wbc 8.4; hgb 12.2; hct 40.3; mcv 95.3 plt 176; vit B 12: 777 iron 56; tibc 215; ferritin 284 folate>100.0  06-25-21:  glucose 82; bun 31; creat 1.26; k+ 4.6; na++ 138; ca 9.1; GFR 44  08-31-21: wbc 6.5; hgb 10.4; hct 32.5; mcv 93.9 plt 181; glucose 118; bun 24; creat 1.59; k+ 4.2; na++ 133; ca 9.0; gfr 38 phos 4.7; albumin 3.9  09-22-21: wbc 6.3; hgb 9.2; hct 29.4; mcv 94.8 plt 182; glucose 91; bun 32; creat 1.43; k 4.1; na++ 133; ca 9.0; GFR 38; phos 4.7; albumin 3.9 10-13-21: urine micro-albumin 12.8 11-10-21: chol 149; ldl 83; trig 80 hdl 50  TODAY  01-01-22: hgb a1c 5.4 01-14-22: wbc 5.8; hgb 10.2; hct 30.8; mc 89.8 plt 1409; glucose 101; bun 17; creat 1.14; k+ 4.4; na++ 128; ca 8.8; gfr 50; phos 3.5 albumin 3.7    Review of Systems  Constitutional:  Negative for fever and malaise/fatigue.  HENT:  Negative for sore throat.   Respiratory:  Positive for cough. Negative for sputum production, shortness of breath and wheezing.   Cardiovascular:  Negative for chest pain, palpitations and leg swelling.  Gastrointestinal:  Negative for abdominal pain, constipation and heartburn.  Musculoskeletal:  Negative for back pain, joint pain and myalgias.  Skin: Negative.   Neurological:  Negative for dizziness.  Psychiatric/Behavioral:  The patient is not nervous/anxious.    Physical Exam Constitutional:      General: She is not in acute distress.    Appearance: She is well-developed. She is not diaphoretic.  Neck:     Thyroid: No thyromegaly.  Cardiovascular:     Rate and Rhythm: Normal rate and regular rhythm.     Pulses: Normal pulses.     Heart sounds: Normal heart sounds.  Pulmonary:     Effort: Pulmonary effort is normal. No respiratory distress.  Breath sounds: Rhonchi present.     Comments: Left lower lobe  Abdominal:     General: Bowel sounds are normal. There is no distension.     Palpations: Abdomen is soft.     Tenderness: There is no abdominal tenderness.  Musculoskeletal:        General: Normal range of motion.     Cervical back: Neck supple.     Right lower leg: No edema.     Left lower leg:  No edema.  Lymphadenopathy:     Cervical: No cervical adenopathy.  Skin:    General: Skin is warm and dry.  Neurological:     Mental Status: She is alert and oriented to person, place, and time.  Psychiatric:        Mood and Affect: Mood normal.       ASSESSMENT/ PLAN:  TODAY  HCAP: left lower lung: will begin augmentin 875 mg twice daily through 01-25-22; will begin tessalon 100 mg every 8 hours through 01-25-22  2. Hyponatremia: will begin NACL 1 gm twice daily will repeat labs on 01-22-22.   Will have her return to nephrology asap.    Ok Edwards NP [facility]   call [phone]

## 2022-01-19 DIAGNOSIS — B351 Tinea unguium: Secondary | ICD-10-CM | POA: Diagnosis not present

## 2022-01-19 DIAGNOSIS — I739 Peripheral vascular disease, unspecified: Secondary | ICD-10-CM | POA: Diagnosis not present

## 2022-01-19 DIAGNOSIS — M2042 Other hammer toe(s) (acquired), left foot: Secondary | ICD-10-CM | POA: Diagnosis not present

## 2022-01-19 DIAGNOSIS — M2041 Other hammer toe(s) (acquired), right foot: Secondary | ICD-10-CM | POA: Diagnosis not present

## 2022-01-19 LAB — HM DIABETES FOOT EXAM: HM Diabetic Foot Exam: 360

## 2022-01-22 ENCOUNTER — Other Ambulatory Visit (HOSPITAL_COMMUNITY)
Admission: RE | Admit: 2022-01-22 | Discharge: 2022-01-22 | Disposition: A | Discharge status: Home / Self Care | Payer: Medicare Other | Source: Skilled Nursing Facility | Attending: Adult Health | Admitting: Adult Health

## 2022-01-22 DIAGNOSIS — E871 Hypo-osmolality and hyponatremia: Secondary | ICD-10-CM | POA: Insufficient documentation

## 2022-01-22 LAB — BASIC METABOLIC PANEL
Anion gap: 8 (ref 5–15)
BUN: 16 mg/dL (ref 8–23)
CO2: 26 mmol/L (ref 22–32)
Calcium: 9 mg/dL (ref 8.9–10.3)
Chloride: 100 mmol/L (ref 98–111)
Creatinine, Ser: 1.13 mg/dL — ABNORMAL HIGH (ref 0.44–1.00)
GFR, Estimated: 50 mL/min — ABNORMAL LOW (ref >60)
Glucose, Bld: 78 mg/dL (ref 70–99)
Potassium: 4.5 mmol/L (ref 3.5–5.1)
Sodium: 134 mmol/L — ABNORMAL LOW (ref 135–145)

## 2022-01-28 DIAGNOSIS — E1129 Type 2 diabetes mellitus with other diabetic kidney complication: Secondary | ICD-10-CM | POA: Diagnosis not present

## 2022-01-28 DIAGNOSIS — D696 Thrombocytopenia, unspecified: Secondary | ICD-10-CM | POA: Diagnosis not present

## 2022-01-28 DIAGNOSIS — I129 Hypertensive chronic kidney disease with stage 1 through stage 4 chronic kidney disease, or unspecified chronic kidney disease: Secondary | ICD-10-CM | POA: Diagnosis not present

## 2022-01-28 DIAGNOSIS — R809 Proteinuria, unspecified: Secondary | ICD-10-CM | POA: Diagnosis not present

## 2022-01-28 DIAGNOSIS — N189 Chronic kidney disease, unspecified: Secondary | ICD-10-CM | POA: Diagnosis not present

## 2022-01-28 DIAGNOSIS — E871 Hypo-osmolality and hyponatremia: Secondary | ICD-10-CM | POA: Diagnosis not present

## 2022-01-28 DIAGNOSIS — E1122 Type 2 diabetes mellitus with diabetic chronic kidney disease: Secondary | ICD-10-CM | POA: Diagnosis not present

## 2022-01-28 DIAGNOSIS — D638 Anemia in other chronic diseases classified elsewhere: Secondary | ICD-10-CM | POA: Diagnosis not present

## 2022-02-04 ENCOUNTER — Non-Acute Institutional Stay (SKILLED_NURSING_FACILITY): Payer: Medicare Other | Admitting: Adult Health

## 2022-02-04 ENCOUNTER — Inpatient Hospital Stay (HOSPITAL_COMMUNITY): Payer: Medicare Other | Attending: Hematology

## 2022-02-04 ENCOUNTER — Encounter: Payer: Self-pay | Admitting: Adult Health

## 2022-02-04 ENCOUNTER — Ambulatory Visit (HOSPITAL_COMMUNITY): Payer: Medicare Other | Admitting: Physician Assistant

## 2022-02-04 DIAGNOSIS — N183 Chronic kidney disease, stage 3 unspecified: Secondary | ICD-10-CM | POA: Diagnosis not present

## 2022-02-04 DIAGNOSIS — E538 Deficiency of other specified B group vitamins: Secondary | ICD-10-CM | POA: Diagnosis not present

## 2022-02-04 DIAGNOSIS — E1122 Type 2 diabetes mellitus with diabetic chronic kidney disease: Secondary | ICD-10-CM | POA: Diagnosis not present

## 2022-02-04 DIAGNOSIS — D519 Vitamin B12 deficiency anemia, unspecified: Secondary | ICD-10-CM | POA: Insufficient documentation

## 2022-02-04 DIAGNOSIS — H40053 Ocular hypertension, bilateral: Secondary | ICD-10-CM | POA: Diagnosis not present

## 2022-02-04 DIAGNOSIS — D631 Anemia in chronic kidney disease: Secondary | ICD-10-CM

## 2022-02-04 DIAGNOSIS — I129 Hypertensive chronic kidney disease with stage 1 through stage 4 chronic kidney disease, or unspecified chronic kidney disease: Secondary | ICD-10-CM | POA: Diagnosis not present

## 2022-02-04 DIAGNOSIS — N1832 Chronic kidney disease, stage 3b: Secondary | ICD-10-CM | POA: Diagnosis not present

## 2022-02-04 LAB — CBC WITH DIFFERENTIAL/PLATELET
Abs Immature Granulocytes: 0.01 10*3/uL (ref 0.00–0.07)
Basophils Absolute: 0 10*3/uL (ref 0.0–0.1)
Basophils Relative: 1 %
Eosinophils Absolute: 0.5 10*3/uL (ref 0.0–0.5)
Eosinophils Relative: 6 %
HCT: 36.1 % (ref 36.0–46.0)
Hemoglobin: 11.5 g/dL — ABNORMAL LOW (ref 12.0–15.0)
Immature Granulocytes: 0 %
Lymphocytes Relative: 21 %
Lymphs Abs: 1.8 10*3/uL (ref 0.7–4.0)
MCH: 29.6 pg (ref 26.0–34.0)
MCHC: 31.9 g/dL (ref 30.0–36.0)
MCV: 93 fL (ref 80.0–100.0)
Monocytes Absolute: 0.5 10*3/uL (ref 0.1–1.0)
Monocytes Relative: 6 %
Neutro Abs: 5.7 10*3/uL (ref 1.7–7.7)
Neutrophils Relative %: 66 %
Platelets: 211 10*3/uL (ref 150–400)
RBC: 3.88 MIL/uL (ref 3.87–5.11)
RDW: 13.7 % (ref 11.5–15.5)
WBC: 8.4 10*3/uL (ref 4.0–10.5)
nRBC: 0 % (ref 0.0–0.2)

## 2022-02-04 LAB — COMPREHENSIVE METABOLIC PANEL
ALT: 9 U/L (ref 0–44)
AST: 9 U/L — ABNORMAL LOW (ref 15–41)
Albumin: 4.4 g/dL (ref 3.5–5.0)
Alkaline Phosphatase: 89 U/L (ref 38–126)
Anion gap: 8 (ref 5–15)
BUN: 17 mg/dL (ref 8–23)
CO2: 25 mmol/L (ref 22–32)
Calcium: 9.5 mg/dL (ref 8.9–10.3)
Chloride: 101 mmol/L (ref 98–111)
Creatinine, Ser: 1.21 mg/dL — ABNORMAL HIGH (ref 0.44–1.00)
GFR, Estimated: 46 mL/min — ABNORMAL LOW (ref >60)
Glucose, Bld: 121 mg/dL — ABNORMAL HIGH (ref 70–99)
Potassium: 4.7 mmol/L (ref 3.5–5.1)
Sodium: 134 mmol/L — ABNORMAL LOW (ref 135–145)
Total Bilirubin: 0.7 mg/dL (ref 0.3–1.2)
Total Protein: 8.3 g/dL — ABNORMAL HIGH (ref 6.5–8.1)

## 2022-02-04 LAB — IRON AND TIBC
Iron: 38 ug/dL (ref 28–170)
Saturation Ratios: 18 % (ref 10.4–31.8)
TIBC: 210 ug/dL — ABNORMAL LOW (ref 250–450)
UIBC: 172 ug/dL

## 2022-02-04 LAB — FERRITIN: Ferritin: 458 ng/mL — ABNORMAL HIGH (ref 11–307)

## 2022-02-04 LAB — VITAMIN B12: Vitamin B-12: 913 pg/mL (ref 180–914)

## 2022-02-04 NOTE — Progress Notes (Unsigned)
Location:  [facility] Room Number: [place] 150P Place of Service:  SNF (31)   CODE STATUS: DNR  Allergies  Allergen Reactions   Latex Rash   Levofloxacin Nausea And Vomiting   Neomycin-Bacitracin-Polymyxin  [Bacitracin-Neomycin-Polymyxin] Rash   Other Swelling and Rash    TOMATO  SOUP-PER MAR   Prednisone Hives, Itching, Rash and Other (See Comments)   Fish Allergy     rash   Shellfish Allergy Hives   Neosporin [Neomycin-Polymyxin-Gramicidin] Itching and Rash    Chief Complaint  Patient presents with   Medical Management of Chronic Issues    Medical Management of Chronic Issues.     HPI:    Past Medical History:  Diagnosis Date   Allergy    Anemia    pernicious anemia   Arthritis    Asthma    Blood transfusion without reported diagnosis    CKD (chronic kidney disease)    COPD (chronic obstructive pulmonary disease) ([place]) 12/23/2019   Diabetic peripheral neuropathy associated with type 2 diabetes mellitus ([place]) 04/07/2021   Gastritis    GERD (gastroesophageal reflux disease)    Glaucoma    History of blood in urine    Hyperlipidemia    Hypertension    Neuropathy    both feet   Ocular hypertension    Osteoporosis    Stroke ([place])    Type 2 diabetes mellitus ([place]) 12/23/2019   Vitamin B12 deficiency anemia due to intrinsic factor deficiency     Past Surgical History:  Procedure Laterality Date   CHOLECYSTECTOMY  2008   COLONOSCOPY  04/30/2011   Procedure: COLONOSCOPY;  Surgeon: Rogene Houston, MD;  Location: [facility];  Service: Endoscopy;  Laterality: N/A;   COLONOSCOPY  05/25/2012   Procedure: COLONOSCOPY;  Surgeon: Rogene Houston, MD;  Location: [facility];  Service: Endoscopy;  Laterality: N/A;  1:25-changed to 1200 Ann to notify pt   COLONOSCOPY Bilateral 12/2017   COLONOSCOPY WITH PROPOFOL N/A 11/29/2020   Procedure: COLONOSCOPY WITH PROPOFOL;  Surgeon: Harvel Quale, MD;  Location: [facility];  Service:  Gastroenterology;  Laterality: N/A;  1:35 [facility] is in the [facility]   ESOPHAGOGASTRODUODENOSCOPY N/A 12/07/2019   Procedure: ESOPHAGOGASTRODUODENOSCOPY (EGD);  Surgeon: Rogene Houston, MD;  Location: [facility];  Service: Endoscopy;  Laterality: N/A;  155   GALLBLADDER SURGERY     right breast cystectomy  1988   TUBAL LIGATION  1975    Social History   Socioeconomic History   Marital status: Divorced    Spouse name: Not on file   Number of children: 4   Years of education: 9   Highest education level: Not on file  Occupational History   Occupation: retired   Tobacco Use   Smoking status: Former    Packs/day: 1.00    Years: 40.00    Total pack years: 40.00    Types: Cigarettes    Quit date: 04/03/2011    Years since quitting: 10.8   Smokeless tobacco: Never  Vaping Use   Vaping Use: Never used  Substance and Sexual Activity   Alcohol use: No   Drug use: No   Sexual activity: Not Currently  Other Topics Concern   Not on file  Social History Narrative   01/17/[birth year] lives at [facility], [place]   Social Determinants of Health   Financial Resource Strain: Not on file  Food Insecurity: Not on file  Transportation Needs: Not on file  Physical  Activity: Not on file  Stress: Not on file  Social Connections: Not on file  Intimate Partner Violence: Not on file   Family History  Problem Relation Age of Onset   Diabetes Mother    Hyperlipidemia Mother    Hypertension Mother    Heart disease Father    Hyperlipidemia Father    Diabetes Sister    Hyperlipidemia Sister    Hypertension Sister    Cancer Brother    Heart disease Brother    Hyperlipidemia Brother    Hypertension Brother    Diabetes Sister       VITAL SIGNS BP (!) 107/58   Pulse 70   Temp (!) 97.5 F (36.4 C) (Skin)   Resp 18   Ht '5\' 4"'$  (1.626 m)   Wt 109 lb (49.4 kg)   SpO2 96%   BMI 18.71 kg/m   Outpatient Encounter Medications as of 02/04/2022  Medication Sig   acetaminophen  (TYLENOL) 325 MG tablet Take 2 tablets (650 mg total) by mouth every 6 (six) hours as needed for mild pain (or Fever >/= 101).   albuterol (VENTOLIN HFA) 108 (90 Base) MCG/ACT inhaler Inhale 2 puffs into the lungs every 4 (four) hours as needed for wheezing or shortness of breath.   amLODipine (NORVASC) 10 MG tablet Take 1 tablet (10 mg total) by mouth daily.   aspirin EC 81 MG tablet Take 81 mg by mouth daily. Swallow whole.   Balsam Peru-Castor Oil (VENELEX) OINT Apply 1 application topically as needed. Apply to sacrum and bilateral buttocks Every Shift - PRN; PRN 1, PRN 2, PRN 3   benzocaine-menthol (SORE THROAT) 6-10 MG lozenge Take 1 lozenge by mouth as needed for sore throat.   benzonatate (TESSALON) 100 MG capsule Take 100 mg by mouth every 8 (eight) hours.   cyanocobalamin (,VITAMIN B-12,) 1000 MCG/ML injection Inject 1 mL (1,000 mcg total) into the muscle every 30 (thirty) days.   docusate sodium (COLACE) 100 MG capsule Take 1 capsule (100 mg total) by mouth 2 (two) times daily.   dorzolamide (TRUSOPT) 2 % ophthalmic solution Place 1 drop into both eyes 2 (two) times daily. (0900 & 1700)   ergocalciferol (VITAMIN D2) 1.25 MG (50000 UT) capsule Take 50,000 Units by mouth once a week. Thursday   folic acid (FOLVITE) 1 MG tablet Take 1 mg by mouth daily. (0900)   gabapentin (NEURONTIN) 100 MG capsule Take 100 mg by mouth at bedtime. (2100)   guaiFENesin-dextromethorphan (ROBITUSSIN DM) 100-10 MG/5ML syrup Take 15 mLs by mouth every 6 (six) hours as needed for cough.   Iron, Ferrous Sulfate, 325 (65 Fe) MG TABS Take 1 tablet by mouth daily.   loperamide (IMODIUM A-D) 2 MG tablet Take 2 mg by mouth every 4 (four) hours as needed for diarrhea or loose stools.   losartan (COZAAR) 25 MG tablet Take 25 mg by mouth daily.   metoCLOPramide (REGLAN) 5 MG tablet Take 1 tablet (5 mg total) by mouth 3 (three) times daily before meals.   Netarsudil-Latanoprost (ROCKLATAN) 0.02-0.005 % SOLN Apply 1 drop to  eye every evening.   NON FORMULARY Diet:Regular thin liquids  ALLERGIC TO FISH   omeprazole (PRILOSEC) 40 MG capsule Take 40 mg by mouth daily at 6 (six) AM. (0600)   ondansetron (ZOFRAN) 4 MG tablet Take 4 mg by mouth every 6 (six) hours as needed for nausea or vomiting.   rosuvastatin (CRESTOR) 10 MG tablet Take 5 mg by mouth daily. Once A Day on  Mon, Wed, Fri   sodium chloride 1 g tablet Take 1 g by mouth 2 (two) times daily with a meal.   amoxicillin-clavulanate (AUGMENTIN) 875-125 MG tablet Take 1 tablet by mouth 2 (two) times daily. (Patient not taking: Reported on 02/04/2022)   No facility-administered encounter medications on file as of 02/04/2022.     SIGNIFICANT DIAGNOSTIC EXAMS       ASSESSMENT/ PLAN:     Ok Edwards NP [facility]  Contact [phone] Monday through Friday 8am- 5pm  After hours call [phone]

## 2022-02-05 ENCOUNTER — Other Ambulatory Visit (HOSPITAL_COMMUNITY)
Admission: RE | Admit: 2022-02-05 | Discharge: 2022-02-05 | Disposition: A | Discharge status: Home / Self Care | Payer: Medicare Other | Source: Skilled Nursing Facility | Attending: Adult Health | Admitting: Adult Health

## 2022-02-05 DIAGNOSIS — E785 Hyperlipidemia, unspecified: Secondary | ICD-10-CM | POA: Diagnosis not present

## 2022-02-05 LAB — LIPID PANEL
Cholesterol: 117 mg/dL (ref 0–200)
HDL: 46 mg/dL (ref >40)
LDL Cholesterol: 63 mg/dL (ref 0–99)
Total CHOL/HDL Ratio: 2.5 RATIO
Triglycerides: 38 mg/dL (ref <150)
VLDL: 8 mg/dL (ref 0–40)

## 2022-02-05 LAB — HOMOCYSTEINE: Homocysteine: 16.2 umol/L (ref 0.0–19.2)

## 2022-02-05 LAB — TSH: TSH: 1.858 u[IU]/mL (ref 0.350–4.500)

## 2022-02-05 LAB — FOLATE: Folate: >40 ng/mL (ref >5.9)

## 2022-02-06 LAB — METHYLMALONIC ACID, SERUM: Methylmalonic Acid, Quantitative: 106 nmol/L (ref 0–378)

## 2022-02-10 NOTE — Progress Notes (Unsigned)
[name], [place] [address]   CLINIC:  Medical Oncology/Hematology  PCP:  Gerlene Fee, NP [place] [address] [phone]   REASON FOR VISIT:  Follow-up for anemia due to CKD stage IIIb  PRIOR THERAPY: Epogen, Retacrit  CURRENT THERAPY: Observation  INTERVAL HISTORY:  Traci Parker 76 y.o. female returns for routine follow-up of anemia.  She was last seen by Tarri Abernethy PA-C on 08/20/2021.  At today's visit, she reports feeling fair.  No recent hospitalizations, surgeries, or changes in baseline health status.  She reports good energy and denies any abnormal fatigue.  She denies any hematemesis, hematochezia, melena, or epistaxis.  No pica, restless legs, or headaches.  No chest pain, dyspnea on exertion, lightheadedness, or syncope.   She denies any unilateral leg swelling, pain, and erythema.   No shortness of breath, dyspnea on exertion, chest pain, cough, hemoptysis, and palpitations.  She has 90% energy and 80% appetite. She endorses that she is maintaining a stable weight.   REVIEW OF SYSTEMS:    Review of Systems  Constitutional:  Negative for appetite change, chills, diaphoresis, fatigue, fever and unexpected weight change.  HENT:   Negative for lump/mass and nosebleeds.   Eyes:  Negative for eye problems.  Respiratory:  Negative for cough, hemoptysis and shortness of breath.   Cardiovascular:  Negative for chest pain, leg swelling and palpitations.  Gastrointestinal:  Negative for abdominal pain, blood in stool, constipation, nausea and vomiting.  Genitourinary:  Negative for hematuria.   Skin: Negative.   Neurological:  Negative for dizziness, headaches and light-headedness.  Hematological:  Does not bruise/bleed easily.      PAST MEDICAL/SURGICAL HISTORY:  Past Medical History:  Diagnosis Date   Allergy    Anemia    pernicious anemia   Arthritis    Asthma    Blood transfusion without  reported diagnosis    CKD (chronic kidney disease)    COPD (chronic obstructive pulmonary disease) ([place]) 12/23/2019   Diabetic peripheral neuropathy associated with type 2 diabetes mellitus ([place]) 04/07/2021   Gastritis    GERD (gastroesophageal reflux disease)    Glaucoma    History of blood in urine    Hyperlipidemia    Hypertension    Neuropathy    both feet   Ocular hypertension    Osteoporosis    Stroke ([place])    Type 2 diabetes mellitus ([place]) 12/23/2019   Vitamin B12 deficiency anemia due to intrinsic factor deficiency    Past Surgical History:  Procedure Laterality Date   CHOLECYSTECTOMY  2008   COLONOSCOPY  04/30/2011   Procedure: COLONOSCOPY;  Surgeon: Rogene Houston, MD;  [facility];  Service: Endoscopy;  Laterality: N/A;   COLONOSCOPY  05/25/2012   Procedure: COLONOSCOPY;  Surgeon: Rogene Houston, MD;  [facility];  Service: Endoscopy;  Laterality: N/A;  1:25-changed to 1200 Ann to notify pt   COLONOSCOPY Bilateral 12/2017   COLONOSCOPY WITH PROPOFOL N/A 11/29/2020   Procedure: COLONOSCOPY WITH PROPOFOL;  Surgeon: Harvel Quale, MD;  [facility];  Service: Gastroenterology;  Laterality: N/A;  1:35 [facility] is in the [facility]   ESOPHAGOGASTRODUODENOSCOPY N/A 12/07/2019   Procedure: ESOPHAGOGASTRODUODENOSCOPY (EGD);  Surgeon: Rogene Houston, MD;  [facility]: [facility];  Service: Endoscopy;  Laterality: N/A;  155   GALLBLADDER SURGERY     right breast cystectomy  [place]  SOCIAL HISTORY:  Social History   Socioeconomic History   Marital status: Divorced    Spouse name: Not on file   Number of children: 4   Years of education: 9   Highest education level: Not on file  Occupational History   Occupation: retired   Tobacco Use   Smoking status: Former    Packs/day: 1.00    Years: 40.00    Total pack years: 40.00    Types: Cigarettes    Quit date: 04/03/2011    Years since quitting:  10.8   Smokeless tobacco: Never  Vaping Use   Vaping Use: Never used  Substance and Sexual Activity   Alcohol use: No   Drug use: No   Sexual activity: Not Currently  Other Topics Concern   Not on file  Social History Narrative   01/17/20 lives at [facility], [place]   Social Determinants of Health   Financial Resource Strain: Not on file  Food Insecurity: Not on file  Transportation Needs: Not on file  Physical Activity: Not on file  Stress: Not on file  Social Connections: Not on file  Intimate Partner Violence: Not on file    FAMILY HISTORY:  Family History  Problem Relation Age of Onset   Diabetes Mother    Hyperlipidemia Mother    Hypertension Mother    Heart disease Father    Hyperlipidemia Father    Diabetes Sister    Hyperlipidemia Sister    Hypertension Sister    Cancer Brother    Heart disease Brother    Hyperlipidemia Brother    Hypertension Brother    Diabetes Sister     CURRENT MEDICATIONS:  Outpatient Encounter Medications as of 02/11/2022  Medication Sig   acetaminophen (TYLENOL) 325 MG tablet Take 2 tablets (650 mg total) by mouth every 6 (six) hours as needed for mild pain (or Fever >/= 101).   albuterol (VENTOLIN HFA) 108 (90 Base) MCG/ACT inhaler Inhale 2 puffs into the lungs every 4 (four) hours as needed for wheezing or shortness of breath.   amLODipine (NORVASC) 10 MG tablet Take 1 tablet (10 mg total) by mouth daily.   amoxicillin-clavulanate (AUGMENTIN) 875-125 MG tablet Take 1 tablet by mouth 2 (two) times daily. (Patient not taking: Reported on 02/04/2022)   aspirin EC 81 MG tablet Take 81 mg by mouth daily. Swallow whole.   Balsam [place]-Castor Oil (VENELEX) OINT Apply 1 application topically as needed. Apply to sacrum and bilateral buttocks Every Shift - PRN; PRN 1, PRN 2, PRN 3   benzocaine-menthol (SORE THROAT) 6-10 MG lozenge Take 1 lozenge by mouth as needed for sore throat.   benzonatate (TESSALON) 100 MG capsule Take 100 mg by mouth  every 8 (eight) hours.   cyanocobalamin (,VITAMIN B-12,) 1000 MCG/ML injection Inject 1 mL (1,000 mcg total) into the muscle every 30 (thirty) days.   docusate sodium (COLACE) 100 MG capsule Take 1 capsule (100 mg total) by mouth 2 (two) times daily.   dorzolamide (TRUSOPT) 2 % ophthalmic solution Place 1 drop into both eyes 2 (two) times daily. (0900 & 1700)   ergocalciferol (VITAMIN D2) 1.25 MG (50000 UT) capsule Take 50,000 Units by mouth once a week. Thursday   folic acid (FOLVITE) 1 MG tablet Take 1 mg by mouth daily. (0900)   gabapentin (NEURONTIN) 100 MG capsule Take 100 mg by mouth at bedtime. (2100)   guaiFENesin-dextromethorphan (ROBITUSSIN DM) 100-10 MG/5ML syrup Take 15 mLs by mouth every 6 (six) hours as needed  for cough.   Iron, Ferrous Sulfate, 325 (65 Fe) MG TABS Take 1 tablet by mouth daily.   loperamide (IMODIUM A-D) 2 MG tablet Take 2 mg by mouth every 4 (four) hours as needed for diarrhea or loose stools.   losartan (COZAAR) 25 MG tablet Take 25 mg by mouth daily.   metoCLOPramide (REGLAN) 5 MG tablet Take 1 tablet (5 mg total) by mouth 3 (three) times daily before meals.   Netarsudil-Latanoprost (ROCKLATAN) 0.02-0.005 % SOLN Apply 1 drop to eye every evening.   NON FORMULARY Diet:Regular thin liquids  ALLERGIC TO FISH   omeprazole (PRILOSEC) 40 MG capsule Take 40 mg by mouth daily at 6 (six) AM. (0600)   ondansetron (ZOFRAN) 4 MG tablet Take 4 mg by mouth every 6 (six) hours as needed for nausea or vomiting.   rosuvastatin (CRESTOR) 10 MG tablet Take 5 mg by mouth daily. Once A Day on Mon, Wed, Fri   sodium chloride 1 g tablet Take 1 g by mouth 2 (two) times daily with a meal.   No facility-administered encounter medications on file as of 02/11/2022.    ALLERGIES:  Allergies  Allergen Reactions   Latex Rash   Levofloxacin Nausea And Vomiting   Neomycin-Bacitracin-Polymyxin  [Bacitracin-Neomycin-Polymyxin] Rash   Other Swelling and Rash    TOMATO  SOUP-PER MAR    Prednisone Hives, Itching, Rash and Other (See Comments)   Fish Allergy     rash   Shellfish Allergy Hives   Neosporin [Neomycin-Polymyxin-Gramicidin] Itching and Rash     PHYSICAL EXAM:    ECOG PERFORMANCE STATUS: 2 - Symptomatic, <50% confined to bed  There were no vitals filed for this visit. There were no vitals filed for this visit. Physical Exam Constitutional:      Appearance: Normal appearance.     Comments: Presents in wheelchair  HENT:     Head: Normocephalic and atraumatic.     Mouth/Throat:     Mouth: Mucous membranes are moist.  Eyes:     Extraocular Movements: Extraocular movements intact.     Pupils: Pupils are equal, round, and reactive to light.  Cardiovascular:     Rate and Rhythm: Normal rate and regular rhythm.     Pulses: Normal pulses.     Heart sounds: Normal heart sounds.  Pulmonary:     Effort: Pulmonary effort is normal.     Breath sounds: Normal breath sounds.  Abdominal:     General: Bowel sounds are normal.     Palpations: Abdomen is soft.     Tenderness: There is no abdominal tenderness.  Musculoskeletal:        General: No swelling.     Right lower leg: No edema.     Left lower leg: No edema.  Lymphadenopathy:     Cervical: No cervical adenopathy.  Skin:    General: Skin is warm and dry.  Neurological:     General: No focal deficit present.     Mental Status: She is alert and oriented to person, place, and time.  Psychiatric:        Mood and Affect: Mood normal.        Behavior: Behavior normal.     LABORATORY DATA:  I have reviewed the labs as listed.  CBC    Component Value Date/Time   WBC 8.4 02/04/2022 1018   RBC 3.88 02/04/2022 1018   HGB 11.5 (L) 02/04/2022 1018   HCT 36.1 02/04/2022 1018   PLT 211 02/04/2022 1018  MCV 93.0 02/04/2022 1018   MCH 29.6 02/04/2022 1018   MCHC 31.9 02/04/2022 1018   RDW 13.7 02/04/2022 1018   LYMPHSABS 1.8 02/04/2022 1018   MONOABS 0.5 02/04/2022 1018   EOSABS 0.5 02/04/2022 1018    BASOSABS 0.0 02/04/2022 1018      Latest Ref Rng & Units 02/04/2022   10:18 AM 01/22/2022    8:18 AM 01/14/2022    8:56 AM  CMP  Glucose 70 - 99 mg/dL 121  78  101   BUN 8 - 23 mg/dL '17  16  17   '$ Creatinine 0.44 - 1.00 mg/dL 1.21  1.13  1.14   Sodium 135 - 145 mmol/L 134  134  128   Potassium 3.5 - 5.1 mmol/L 4.7  4.5  4.4   Chloride 98 - 111 mmol/L 101  100  93   CO2 22 - 32 mmol/L '25  26  26   '$ Calcium 8.9 - 10.3 mg/dL 9.5  9.0  8.8   Total Protein 6.5 - 8.1 g/dL 8.3     Total Bilirubin 0.3 - 1.2 mg/dL 0.7     Alkaline Phos 38 - 126 U/L 89     AST 15 - 41 U/L 9     ALT 0 - 44 U/L 9       DIAGNOSTIC IMAGING:  I have independently reviewed the relevant imaging and discussed with the patient.  ASSESSMENT & PLAN: 1.  Normocytic anemia, secondary to CKD and relative iron deficiency - Etiology is from combination of CKD and relative iron deficiency. - Initial work-up included SPEP which was negative.  Stool was negative for occult blood.  Hemoglobin electrophoresis showed severe microcytosis related to her anemia.  She could have underlying thalassemia/sickle cell trait. - Last colonoscopy was in June 2019 in [place], 3 polyps removed. - She has a history of needing a blood transfusion in the early 2000's. - She denies any bright red blood per rectum or melena      - We took over Epogen injections for Dr. Theador Hawthorne, but patient has not required Retacrit for the past 6 months due to Hgb > 11.0 (last Retacrit was 02/11/2021), therefore Retacrit protocol discontinued. - She was started on oral ferrous sulfate at last visit, but reported significant constipation, which was relieved by Colace      - Most recent labs (02/04/2022): Hgb 11.5/MCV 93.0, ferritin 458, iron saturation 18% with low TIBC 210.  Baseline creatinine 1.21/GFR 46. - PLAN: No indication for IV iron or ESA at this time. - Continue oral iron supplementation with Colace twice daily. -  RTC in 6 months for repeat labs and office  visit.       2.  CKD stage IIIa/B -Ultrasound the kidneys on 08/22/2019 showed bilateral renal atrophy, severe atrophy of the right kidney. - PLAN: Continue follow-up with Dr. Theador Hawthorne   3.  A25 and folic acid deficiency: - She is on B12 injections monthly at SNF - She takes daily folic acid supplement - Labs (02/04/2022) showed normal B12, folate, and methylmalonic acid - PLAN: Continue monthly B12 injections and daily folic acid supplement at SNF   4.  History of lower extremity DVT - Venous duplex (02/02/2020): Extensive acute appearing left femoropopliteal occlusive DVT extending into the calf veins. - Considered provoked in the setting of immobility/bedbound functional status - Eliquis was discontinued by her PCP, placed on aspirin and statin - No current signs or symptoms of recurrent DVT  PLAN SUMMARY & DISPOSITION: Labs and RTC in 6 months    All questions were answered. The patient knows to call the clinic with any problems, questions or concerns.  Medical decision making: Low  Time spent on visit: I spent 15 minutes counseling the patient face to face. The total time spent in the appointment was 25 minutes and more than 50% was on counseling.   Harriett Rush, PA-C  02/11/2022 2:00 PM

## 2022-02-11 ENCOUNTER — Inpatient Hospital Stay: Payer: Medicare Other | Attending: Physician Assistant | Admitting: Physician Assistant

## 2022-02-11 VITALS — BP 151/79 | HR 85 | Resp 16

## 2022-02-11 DIAGNOSIS — N189 Chronic kidney disease, unspecified: Secondary | ICD-10-CM

## 2022-02-11 DIAGNOSIS — N1832 Chronic kidney disease, stage 3b: Secondary | ICD-10-CM | POA: Insufficient documentation

## 2022-02-11 DIAGNOSIS — D631 Anemia in chronic kidney disease: Secondary | ICD-10-CM | POA: Diagnosis not present

## 2022-02-11 DIAGNOSIS — E538 Deficiency of other specified B group vitamins: Secondary | ICD-10-CM

## 2022-02-11 NOTE — Patient Instructions (Signed)
[place] at [facility] Discharge Instructions  You were seen today by Tarri Abernethy PA-C for your history of anemia.  Your blood counts looks great today!  We will check your blood and see you back for another visit in 6 months.  LABS: Return in 3 months for repeat labs  MEDICATIONS: - Continue monthly B12 injections - Continue folic acid and iron supplements  FOLLOW-UP APPOINTMENT: Office visit in 6 months   Thank you for choosing [place] at [facility] to provide your oncology and hematology care.  To afford each patient quality time with our provider, please arrive at least 15 minutes before your scheduled appointment time.   If you have a lab appointment with the [facility] please come in thru the Main Entrance and check in at the main information desk.  You need to re-schedule your appointment should you arrive 10 or more minutes late.  We strive to give you quality time with our providers, and arriving late affects you and other patients whose appointments are after yours.  Also, if you no show three or more times for appointments you may be dismissed from the clinic at the providers discretion.     Again, thank you for choosing [facility].  Our hope is that these requests will decrease the amount of time that you wait before being seen by our physicians.       _____________________________________________________________  Should you have questions after your visit to [facility], please contact our office at [phone] and follow the prompts.  Our office hours are 8:00 a.m. and 4:30 p.m. Monday - Friday.  Please note that voicemails left after 4:00 p.m. may not be returned until the following business day.  We are closed weekends and major holidays.  You do have access to a nurse 24-7, just call the main number to the clinic [phone] and do not press any options, hold on the line and a nurse  will answer the phone.    For prescription refill requests, have your pharmacy contact our office and allow 72 hours.    Due to Covid, you will need to wear a mask upon entering the hospital. If you do not have a mask, a mask will be given to you at the Main Entrance upon arrival. For doctor visits, patients may have 1 support person age 4 or older with them. For treatment visits, patients can not have anyone with them due to social distancing guidelines and our immunocompromised population.

## 2022-02-20 ENCOUNTER — Non-Acute Institutional Stay (SKILLED_NURSING_FACILITY): Payer: Medicare Other | Admitting: Adult Health

## 2022-02-20 ENCOUNTER — Encounter: Payer: Self-pay | Admitting: Adult Health

## 2022-02-20 DIAGNOSIS — E1122 Type 2 diabetes mellitus with diabetic chronic kidney disease: Secondary | ICD-10-CM

## 2022-02-20 DIAGNOSIS — N183 Chronic kidney disease, stage 3 unspecified: Secondary | ICD-10-CM | POA: Diagnosis not present

## 2022-02-20 DIAGNOSIS — I129 Hypertensive chronic kidney disease with stage 1 through stage 4 chronic kidney disease, or unspecified chronic kidney disease: Secondary | ICD-10-CM | POA: Diagnosis not present

## 2022-02-20 DIAGNOSIS — G959 Disease of spinal cord, unspecified: Secondary | ICD-10-CM | POA: Diagnosis not present

## 2022-02-20 DIAGNOSIS — I700 Atherosclerosis of aorta: Secondary | ICD-10-CM

## 2022-02-20 NOTE — Progress Notes (Signed)
Location:  [facility] Room Number: 150 Place of Service:  SNF (31)   CODE STATUS: DNR  Allergies  Allergen Reactions   Latex Rash   Levofloxacin Nausea And Vomiting   Neomycin-Bacitracin-Polymyxin  [Bacitracin-Neomycin-Polymyxin] Rash   Other Swelling and Rash    TOMATO  SOUP-PER MAR   Prednisone Hives, Itching, Rash and Other (See Comments)   Fish Allergy     rash   Shellfish Allergy Hives   Neosporin [Neomycin-Polymyxin-Gramicidin] Itching and Rash    Chief Complaint  Patient presents with   Acute Visit    Care plan meeting    HPI:  We have come together for her care plan meeting. No family present.she is ambulatory in her room. Has had no falls.  BIMS 15/15 mood 0/30. She requires extensive assist to dependent with her adl care. She is frequently incontinent of bladder and bowel. Dietary: on regular diet has a good appetite; is able to feed self; weight is stable. She does not attend activities prefers to stay in room. Therapy: none at this time. We have discussed her advanced directives her MOST form has been filled out. She will continue to be followed for her chronic illnesses including:  Aortic atherosclerosis  Type 2 diabetes mellitus with  stage 3 chronic kidney disease and hypertension  Cervical myelopathy   Past Medical History:  Diagnosis Date   Allergy    Anemia    pernicious anemia   Arthritis    Asthma    Blood transfusion without reported diagnosis    CKD (chronic kidney disease)    COPD (chronic obstructive pulmonary disease) ([place]) 12/23/2019   Diabetic peripheral neuropathy associated with type 2 diabetes mellitus ([place]) 04/07/2021   Gastritis    GERD (gastroesophageal reflux disease)    Glaucoma    History of blood in urine    Hyperlipidemia    Hypertension    Neuropathy    both feet   Ocular hypertension    Osteoporosis    Stroke ([place])    Type 2 diabetes mellitus ([place]) 12/23/2019   Vitamin B12 deficiency anemia due to  intrinsic factor deficiency     Past Surgical History:  Procedure Laterality Date   CHOLECYSTECTOMY  2008   COLONOSCOPY  04/30/2011   Procedure: COLONOSCOPY;  Surgeon: Rogene Houston, MD;  [facility];  Service: Endoscopy;  Laterality: N/A;   COLONOSCOPY  05/25/2012   Procedure: COLONOSCOPY;  Surgeon: Rogene Houston, MD;  [facility];  Service: Endoscopy;  Laterality: N/A;  1:25-changed to 1200 Ann to notify pt   COLONOSCOPY Bilateral 12/2017   COLONOSCOPY WITH PROPOFOL N/A 11/29/2020   Procedure: COLONOSCOPY WITH PROPOFOL;  Surgeon: Harvel Quale, MD;  [facility];  Service: Gastroenterology;  Laterality: N/A;  1:35 [facility] is in the [facility]   ESOPHAGOGASTRODUODENOSCOPY N/A 12/07/2019   Procedure: ESOPHAGOGASTRODUODENOSCOPY (EGD);  Surgeon: Rogene Houston, MD;  [facility];  Service: Endoscopy;  Laterality: N/A;  155   GALLBLADDER SURGERY     right breast cystectomy  1988   TUBAL LIGATION  1975    Social History   Socioeconomic History   Marital status: Divorced    Spouse name: Not on file   Number of children: 4   Years of education: 9   Highest education level: Not on file  Occupational History   Occupation: retired   Tobacco Use   Smoking status: Former    Packs/day: 1.00  Years: 40.00    Total pack years: 40.00    Types: Cigarettes    Quit date: 04/03/2011    Years since quitting: 10.8   Smokeless tobacco: Never  Vaping Use   Vaping Use: Never used  Substance and Sexual Activity   Alcohol use: No   Drug use: No   Sexual activity: Not Currently  Other Topics Concern   Not on file  Social History Narrative   01/17/[birth year] lives at [facility], [place]   Social Determinants of Health   Financial Resource Strain: Not on file  Food Insecurity: Not on file  Transportation Needs: Not on file  Physical Activity: Not on file  Stress: Not on file  Social Connections: Not on file  Intimate Partner  Violence: Not on file   Family History  Problem Relation Age of Onset   Diabetes Mother    Hyperlipidemia Mother    Hypertension Mother    Heart disease Father    Hyperlipidemia Father    Diabetes Sister    Hyperlipidemia Sister    Hypertension Sister    Cancer Brother    Heart disease Brother    Hyperlipidemia Brother    Hypertension Brother    Diabetes Sister       VITAL SIGNS BP 116/60   Pulse 73   Temp (!) 97.4 F (36.3 C)   Resp 16   Ht '5\' 4"'$  (1.626 m)   Wt 109 lb 6.4 oz (49.6 kg)   SpO2 97%   BMI 18.78 kg/m   Outpatient Encounter Medications as of 02/20/2022  Medication Sig   acetaminophen (TYLENOL) 325 MG tablet Take 2 tablets (650 mg total) by mouth every 6 (six) hours as needed for mild pain (or Fever >/= 101).   albuterol (VENTOLIN HFA) 108 (90 Base) MCG/ACT inhaler Inhale 2 puffs into the lungs every 4 (four) hours as needed for wheezing or shortness of breath.   amLODipine (NORVASC) 5 MG tablet Take 5 mg by mouth daily.   aspirin EC 81 MG tablet Take 81 mg by mouth daily. Swallow whole.   Balsam [place]-Castor Oil (VENELEX) OINT Apply 1 application topically as needed. Apply to sacrum and bilateral buttocks Every Shift - PRN; PRN 1, PRN 2, PRN 3   benzocaine-menthol (SORE THROAT) 6-10 MG lozenge Take 1 lozenge by mouth as needed for sore throat.   cyanocobalamin (,VITAMIN B-12,) 1000 MCG/ML injection Inject 1 mL (1,000 mcg total) into the muscle every 30 (thirty) days.   docusate sodium (COLACE) 100 MG capsule Take 1 capsule (100 mg total) by mouth 2 (two) times daily.   dorzolamide (TRUSOPT) 2 % ophthalmic solution Place 1 drop into both eyes 2 (two) times daily. (0900 & 1700)   ergocalciferol (VITAMIN D2) 1.25 MG (50000 UT) capsule Take 50,000 Units by mouth once a week. Thursday   folic acid (FOLVITE) 1 MG tablet Take 1 mg by mouth daily. (0900)   gabapentin (NEURONTIN) 100 MG capsule Take 100 mg by mouth at bedtime. (2100)   Iron, Ferrous Sulfate, 325 (65  Fe) MG TABS Take 1 tablet by mouth daily.   loperamide (IMODIUM A-D) 2 MG tablet Take 2 mg by mouth every 4 (four) hours as needed for diarrhea or loose stools.   losartan (COZAAR) 25 MG tablet Take 25 mg by mouth daily.   metoCLOPramide (REGLAN) 5 MG tablet Take 1 tablet (5 mg total) by mouth 3 (three) times daily before meals.   Netarsudil-Latanoprost (ROCKLATAN) 0.02-0.005 % SOLN Apply 1 drop  to eye every evening.   NON FORMULARY Diet:Regular thin liquids  ALLERGIC TO FISH   omeprazole (PRILOSEC) 40 MG capsule Take 40 mg by mouth daily at 6 (six) AM. (0600)   ondansetron (ZOFRAN) 4 MG tablet Take 4 mg by mouth every 6 (six) hours as needed for nausea or vomiting.   rosuvastatin (CRESTOR) 10 MG tablet Take 10 mg by mouth daily.   sodium chloride 1 g tablet Take 1 g by mouth 2 (two) times daily with a meal.   [DISCONTINUED] amLODipine (NORVASC) 10 MG tablet Take 1 tablet (10 mg total) by mouth daily.   [DISCONTINUED] amoxicillin-clavulanate (AUGMENTIN) 875-125 MG tablet Take 1 tablet by mouth 2 (two) times daily.   [DISCONTINUED] benzonatate (TESSALON) 100 MG capsule Take 100 mg by mouth every 8 (eight) hours.   [DISCONTINUED] guaiFENesin-dextromethorphan (ROBITUSSIN DM) 100-10 MG/5ML syrup Take 15 mLs by mouth every 6 (six) hours as needed for cough.   No facility-administered encounter medications on file as of 02/20/2022.     SIGNIFICANT DIAGNOSTIC EXAMS   PREVIOUS  05-24-20: dexa: t score -2.387  NO NEW EXAMS.     LABS REVIEWED PREVIOUS    02-25-21: vit B 12: 764; folate >100 03-25-21: wbc 8.8; hgb 11.4; hct 36.7; mcv 94.6 plt 203  04-22-21: wbc 7.5; hgb 11.3; hct 36.5; mcv 93.4 plt 165 05-06-21: glucose 91; bun 25; creat 1.17; k+ 4.2; na++ 135; ca 8.9; liver normal albumin 3.9; hgb a1c 5.3; chol 210; ldl 139; trig 66; hdl 58; vit D 41.14 05-20-21: wbc 8.4; hgb 12.2; hct 40.3; mcv 95.3 plt 176; vit B 12: 777 iron 56; tibc 215; ferritin 284 folate>100.0  06-25-21: glucose 82; bun  31; creat 1.26; k+ 4.6; na++ 138; ca 9.1; GFR 44  08-31-21: wbc 6.5; hgb 10.4; hct 32.5; mcv 93.9 plt 181; glucose 118; bun 24; creat 1.59; k+ 4.2; na++ 133; ca 9.0; gfr 38 phos 4.7; albumin 3.9  09-22-21: wbc 6.3; hgb 9.2; hct 29.4; mcv 94.8 plt 182; glucose 91; bun 32; creat 1.43; k 4.1; na++ 133; ca 9.0; GFR 38; phos 4.7; albumin 3.9 10-13-21: urine micro-albumin 12.8 11-10-21: chol 149; ldl 83; trig 80 hdl 50 01-01-22: hgb a1c 5.4 01-14-22: wbc 5.8; hgb 10.2; hct 30.8; mc 89.8 plt 1409; glucose 101; bun 17; creat 1.14; k+ 4.4; na++ 128; ca 8.8; gfr 50; phos 3.5 albumin 3.7  01-22-22: glucose 78; bun 16; creat 1.13; k+ 4.5; na++ 134; ca 9.0; gfr 50  02-04-22: wbc 8.4; hgb 11.5; hct 36.1; mcv 93.p plt 211; glucose 121; bun 17; creat 1.21; k+ 4.7; na++ 134; ca 9.5; gfr 46; protein 8.3; albumin 4.4; vitamin B 12; 913; folate >40; iron 28; tibc 210   NO NEW LABS.   Review of Systems  Constitutional:  Negative for malaise/fatigue.  Respiratory:  Negative for cough and shortness of breath.   Cardiovascular:  Negative for chest pain, palpitations and leg swelling.  Gastrointestinal:  Negative for abdominal pain, constipation and heartburn.  Musculoskeletal:  Negative for back pain, joint pain and myalgias.  Skin: Negative.   Neurological:  Negative for dizziness.  Psychiatric/Behavioral:  The patient is not nervous/anxious.     Physical Exam Constitutional:      General: She is not in acute distress.    Appearance: She is well-developed. She is not diaphoretic.     Comments: thin  Neck:     Thyroid: No thyromegaly.  Cardiovascular:     Rate and Rhythm: Normal rate and regular rhythm.  Pulses: Normal pulses.     Heart sounds: Normal heart sounds.  Pulmonary:     Effort: Pulmonary effort is normal. No respiratory distress.     Breath sounds: Normal breath sounds.  Abdominal:     General: Bowel sounds are normal. There is no distension.     Palpations: Abdomen is soft.     Tenderness: There  is no abdominal tenderness.  Musculoskeletal:        General: Normal range of motion.     Cervical back: Neck supple.     Right lower leg: No edema.     Left lower leg: No edema.     Comments: Is able to ambulate in room.   Lymphadenopathy:     Cervical: No cervical adenopathy.  Skin:    General: Skin is warm and dry.  Neurological:     Mental Status: She is alert and oriented to person, place, and time.  Psychiatric:        Mood and Affect: Mood normal.       ASSESSMENT/ PLAN:  TODAY  Aortic atherosclerosis Type 2 diabetes mellitus with  stage 3 chronic kidney disease and hypertension Cervical myelopathy   Will continue current medications Will continue current plan of care Will continue to monitor her status.   Time spent with patient 40 minutes ( 20 minutes spent with advanced directives; MOST form filled out)    Ok Edwards NP [facility] Adult Medicine  call [phone]

## 2022-03-03 ENCOUNTER — Non-Acute Institutional Stay (SKILLED_NURSING_FACILITY): Payer: Medicare Other | Admitting: Internal Medicine

## 2022-03-03 ENCOUNTER — Encounter: Payer: Self-pay | Admitting: Internal Medicine

## 2022-03-03 ENCOUNTER — Encounter: Payer: Self-pay | Admitting: Adult Health

## 2022-03-03 DIAGNOSIS — N183 Chronic kidney disease, stage 3 unspecified: Secondary | ICD-10-CM | POA: Diagnosis not present

## 2022-03-03 DIAGNOSIS — E538 Deficiency of other specified B group vitamins: Secondary | ICD-10-CM | POA: Diagnosis not present

## 2022-03-03 DIAGNOSIS — E1122 Type 2 diabetes mellitus with diabetic chronic kidney disease: Secondary | ICD-10-CM | POA: Diagnosis not present

## 2022-03-03 DIAGNOSIS — I129 Hypertensive chronic kidney disease with stage 1 through stage 4 chronic kidney disease, or unspecified chronic kidney disease: Secondary | ICD-10-CM

## 2022-03-03 DIAGNOSIS — R946 Abnormal results of thyroid function studies: Secondary | ICD-10-CM | POA: Diagnosis not present

## 2022-03-03 NOTE — Progress Notes (Signed)
Location:  [facility] Room Number: 150 P Place of Service:  SNF (31) Provider:  Ok Edwards, NP  CODE STATUS: DNR  Allergies  Allergen Reactions   Latex Rash   Levofloxacin Nausea And Vomiting   Neomycin-Bacitracin-Polymyxin  [Bacitracin-Neomycin-Polymyxin] Rash   Other Swelling and Rash    TOMATO  SOUP-PER MAR   Prednisone Hives, Itching, Rash and Other (See Comments)   Fish Allergy     rash   Shellfish Allergy Hives   Neosporin [Neomycin-Polymyxin-Gramicidin] Itching and Rash    Chief Complaint  Patient presents with   Acute Visit    HPI:    Past Medical History:  Diagnosis Date   Allergy    Anemia    pernicious anemia   Arthritis    Asthma    Blood transfusion without reported diagnosis    CKD (chronic kidney disease)    COPD (chronic obstructive pulmonary disease) ([place]) 12/23/2019   Diabetic peripheral neuropathy associated with type 2 diabetes mellitus ([place]) 04/07/2021   Gastritis    GERD (gastroesophageal reflux disease)    Glaucoma    History of blood in urine    Hyperlipidemia    Hypertension    Neuropathy    both feet   Ocular hypertension    Osteoporosis    Stroke ([place])    Type 2 diabetes mellitus ([place]) 12/23/2019   Vitamin B12 deficiency anemia due to intrinsic factor deficiency     Past Surgical History:  Procedure Laterality Date   CHOLECYSTECTOMY  2008   COLONOSCOPY  04/30/2011   Procedure: COLONOSCOPY;  Surgeon: Rogene Houston, MD;  [facility];  Service: Endoscopy;  Laterality: N/A;   COLONOSCOPY  05/25/2012   Procedure: COLONOSCOPY;  Surgeon: Rogene Houston, MD;  [facility];  Service: Endoscopy;  Laterality: N/A;  1:25-changed to 1200 Ann to notify pt   COLONOSCOPY Bilateral 12/2017   COLONOSCOPY WITH PROPOFOL N/A 11/29/2020   Procedure: COLONOSCOPY WITH PROPOFOL;  Surgeon: Harvel Quale, MD;  [facility];  Service: Gastroenterology;  Laterality: N/A;  1:35  [facility] is in the [facility]   ESOPHAGOGASTRODUODENOSCOPY N/A 12/07/2019   Procedure: ESOPHAGOGASTRODUODENOSCOPY (EGD);  Surgeon: Rogene Houston, MD;  [facility];  Service: Endoscopy;  Laterality: N/A;  155   GALLBLADDER SURGERY     right breast cystectomy  1988   TUBAL LIGATION  1975    Social History   Socioeconomic History   Marital status: Divorced    Spouse name: Not on file   Number of children: 4   Years of education: 9   Highest education level: Not on file  Occupational History   Occupation: retired   Tobacco Use   Smoking status: Former    Packs/day: 1.00    Years: 40.00    Total pack years: 40.00    Types: Cigarettes    Quit date: 04/03/2011    Years since quitting: 10.9   Smokeless tobacco: Never  Vaping Use   Vaping Use: Never used  Substance and Sexual Activity   Alcohol use: No   Drug use: No   Sexual activity: Not Currently  Other Topics Concern   Not on file  Social History Narrative   01/17[birth year] lives at [facility], [place]   Social Determinants of Health   Financial Resource Strain: Not on file  Food Insecurity: Not on file  Transportation Needs: Not on file  Physical Activity: Not on file  Stress: Not on  file  Social Connections: Not on file  Intimate Partner Violence: Not on file   Family History  Problem Relation Age of Onset   Diabetes Mother    Hyperlipidemia Mother    Hypertension Mother    Heart disease Father    Hyperlipidemia Father    Diabetes Sister    Hyperlipidemia Sister    Hypertension Sister    Cancer Brother    Heart disease Brother    Hyperlipidemia Brother    Hypertension Brother    Diabetes Sister       VITAL SIGNS BP 136/75   Pulse 69   Temp 97.8 F (36.6 C)   Resp 18   Ht '5\' 4"'$  (1.626 m)   Wt 109 lb 6.4 oz (49.6 kg)   SpO2 98%   BMI 18.78 kg/m   Outpatient Encounter Medications as of 03/03/2022  Medication Sig   acetaminophen (TYLENOL) 325 MG tablet Take 2 tablets (650 mg total)  by mouth every 6 (six) hours as needed for mild pain (or Fever >/= 101).   albuterol (VENTOLIN HFA) 108 (90 Base) MCG/ACT inhaler Inhale 2 puffs into the lungs every 4 (four) hours as needed for wheezing or shortness of breath.   amLODipine (NORVASC) 5 MG tablet Take 5 mg by mouth daily.   aspirin EC 81 MG tablet Take 81 mg by mouth daily. Swallow whole.   Balsam [place]-Castor Oil (VENELEX) OINT Apply 1 application topically as needed. Apply to sacrum and bilateral buttocks Every Shift - PRN; PRN 1, PRN 2, PRN 3   benzocaine-menthol (SORE THROAT) 6-10 MG lozenge Take 1 lozenge by mouth as needed for sore throat.   cyanocobalamin (,VITAMIN B-12,) 1000 MCG/ML injection Inject 1 mL (1,000 mcg total) into the muscle every 30 (thirty) days.   docusate sodium (COLACE) 100 MG capsule Take 1 capsule (100 mg total) by mouth 2 (two) times daily.   dorzolamide (TRUSOPT) 2 % ophthalmic solution Place 1 drop into both eyes 2 (two) times daily. (0900 & 1700)   ergocalciferol (VITAMIN D2) 1.25 MG (50000 UT) capsule Take 50,000 Units by mouth once a week. Thursday   folic acid (FOLVITE) 1 MG tablet Take 1 mg by mouth daily. (0900)   gabapentin (NEURONTIN) 100 MG capsule Take 100 mg by mouth at bedtime. (2100)   Iron, Ferrous Sulfate, 325 (65 Fe) MG TABS Take 1 tablet by mouth daily.   loperamide (IMODIUM A-D) 2 MG tablet Take 2 mg by mouth every 4 (four) hours as needed for diarrhea or loose stools.   losartan (COZAAR) 25 MG tablet Take 25 mg by mouth daily.   metoCLOPramide (REGLAN) 5 MG tablet Take 1 tablet (5 mg total) by mouth 3 (three) times daily before meals.   Netarsudil-Latanoprost (ROCKLATAN) 0.02-0.005 % SOLN Apply 1 drop to eye every evening.   NON FORMULARY Diet:Regular thin liquids  ALLERGIC TO FISH   omeprazole (PRILOSEC) 40 MG capsule Take 40 mg by mouth daily at 6 (six) AM. (0600)   ondansetron (ZOFRAN) 4 MG tablet Take 4 mg by mouth every 6 (six) hours as needed for nausea or vomiting.    rosuvastatin (CRESTOR) 10 MG tablet Take 10 mg by mouth daily.   sodium chloride 1 g tablet Take 1 g by mouth 2 (two) times daily with a meal.   No facility-administered encounter medications on file as of 03/03/2022.     SIGNIFICANT DIAGNOSTIC EXAMS       ASSESSMENT/ PLAN:     Ok Edwards NP [facility]  Adult Medicine  Contact [phone] Monday through Friday 8am- 5pm  After hours call [phone]  This encounter was created in error - please disregard.

## 2022-03-03 NOTE — Assessment & Plan Note (Signed)
BP controlled; no change in antihypertensive medications  

## 2022-03-03 NOTE — Progress Notes (Unsigned)
NURSING HOME LOCATION:  [facility] ROOM NUMBER:  150 P  CODE STATUS:  DNR  PCP:  Ok Edwards NP   This is a nursing facility follow up visit of chronic medical diagnoses & to document compliance with Regulation 483.30 (c) in [employer] [place] Phase 2 which mandates caregiver visit ( visits can alternate among physician, PA or NP as per statutes) within 10 days of 30 days / 60 days/ 90 days post admission to SNF date    Interim medical record and care since last SNF visit was updated with review of diagnostic studies and change in clinical status since last visit were documented.  HPI: She is a permanent resident of this facility with medical diagnoses of history of asthma, diabetes complicated by neuropathy and CKD, COPD, GERD, dyslipidemia, essential hypertension, history of B12 deficiency, and history of stroke. Significant procedures and surgeries include cholecystectomy, multiple colonoscopies, EGD, and cholecystectomy. She does have a 40-pack-year history of smoking.  Labs reveal minimal hyponatremia which is stable.  Creatinine was 1.21 and GFR 46 indicating CKD stage IIIa.  This has progressed slightly as prior GFR was 50.  LDL is at goal of less than 70 with a value of 63.  Hemoglobin slightly reduced at 11.5; indices are normochromic and normocytic.  B12 and iron levels are normal.  TSH is therapeutic.  Review of systems: She continues to have significant cold intolerance.  She has numbness and tingling in all 4 extremities.  She stated that she was given a good report by her hematologist and does not have to return them for 6 months.  She knew that she had nephrology follow-up appointment with Dr. Theador Hawthorne in November.  The remainder of the review of systems was negative.  Constitutional: No fever, significant weight change, fatigue  Eyes: No redness, discharge, pain, vision change ENT/mouth: No nasal congestion,  purulent discharge, earache,  change in hearing, sore throat  Cardiovascular: No chest pain, palpitations, paroxysmal nocturnal dyspnea, claudication, edema  Respiratory: No cough, sputum production, hemoptysis, DOE, significant snoring, apnea   Gastrointestinal: No heartburn, dysphagia, abdominal pain, nausea /vomiting, rectal bleeding, melena, change in bowels Genitourinary: No dysuria, hematuria, pyuria, incontinence, nocturia Musculoskeletal: No joint stiffness, joint swelling, weakness, pain Dermatologic: No rash, pruritus, change in appearance of skin Neurologic: No dizziness, headache, syncope, seizures, numbness, tingling Psychiatric: No significant anxiety, depression, insomnia, anorexia Endocrine: No change in hair/skin/nails, excessive thirst, excessive hunger, excessive urination  Hematologic/lymphatic: No significant bruising, lymphadenopathy, abnormal bleeding Allergy/immunology: No itchy/watery eyes, significant sneezing, urticaria, angioedema  Physical exam:  Pertinent or positive findings: She is thin but appears adequately nourished.  She is completely edentulous.  Second heart sound is slightly increased.  It is also split.  Pedal pulses are decreased.  She has interosseous wasting of the hands.  The most striking physical finding is that she wears well and mittens and extra garments because of the cold intolerance. General appearance: Adequately nourished; no acute distress, increased work of breathing is present.   Lymphatic: No lymphadenopathy about the head, neck, axilla. Eyes: No conjunctival inflammation or lid edema is present. There is no scleral icterus. Ears:  External ear exam shows no significant lesions or deformities.   Nose:  External nasal examination shows no deformity or inflammation. Nasal mucosa are pink and moist without lesions, exudates Oral exam:  Lips and gums are healthy appearing. There is no oropharyngeal erythema or exudate. Neck:  No thyromegaly, masses, tenderness noted.  Heart:  Normal rate and regular rhythm. S1 and S2 normal without gallop, murmur, click, rub .  Lungs: Chest clear to auscultation without wheezes, rhonchi, rales, rubs. Abdomen: Bowel sounds are normal. Abdomen is soft and nontender with no organomegaly, hernias, masses. GU: Deferred  Extremities:  No cyanosis, clubbing, edema  Neurologic exam : Cn 2-7 intact Strength equal  in upper & lower extremities Balance, Rhomberg, finger to nose testing could not be completed due to clinical state Deep tendon reflexes are equal Skin: Warm & dry w/o tenting. No significant lesions or rash.  See summary under each active problem in the Problem List with associated updated therapeutic plan

## 2022-03-03 NOTE — Patient Instructions (Signed)
See assessment and plan under each diagnosis in the problem list and acutely for this visit 

## 2022-03-03 NOTE — Assessment & Plan Note (Signed)
Current A1c is prediabetic at 5.4%.  There is been slight progression of CKD with current creatinine 1.21 and GFR 46 indicating CKD low stage IIIa.  Med list reviewed; no change indicated.

## 2022-03-03 NOTE — Assessment & Plan Note (Signed)
Minimal anemia present with hemoglobin 11.5 with normochromic, normocytic indices.  B12 and iron levels are normal.

## 2022-03-03 NOTE — Assessment & Plan Note (Signed)
TSH is therapeutic; no change indicated.

## 2022-03-23 ENCOUNTER — Non-Acute Institutional Stay (SKILLED_NURSING_FACILITY): Payer: Medicare Other | Admitting: Adult Health

## 2022-03-23 ENCOUNTER — Encounter: Payer: Self-pay | Admitting: Adult Health

## 2022-03-23 DIAGNOSIS — E1142 Type 2 diabetes mellitus with diabetic polyneuropathy: Secondary | ICD-10-CM

## 2022-03-23 NOTE — Progress Notes (Signed)
Location:  [facility] Room Number: 150 Place of Service:  SNF (31)   CODE STATUS: dnr   Allergies  Allergen Reactions   Latex Rash   Levofloxacin Nausea And Vomiting   Neomycin-Bacitracin-Polymyxin  [Bacitracin-Neomycin-Polymyxin] Rash   Other Swelling and Rash    TOMATO  SOUP-PER MAR   Prednisone Hives, Itching, Rash and Other (See Comments)   Fish Allergy     rash   Shellfish Allergy Hives   Neosporin [Neomycin-Polymyxin-Gramicidin] Itching and Rash    Chief Complaint  Patient presents with   Acute Visit    Left groin pain     HPI:  She is complaining of left groin pain for the past 2 days. She is not getting any relief. The pain is sharp; does not radiate down her leg. She denies any dysuria; no bladder pain; no fevers.   Past Medical History:  Diagnosis Date   Allergy    Anemia    pernicious anemia   Arthritis    Asthma    Blood transfusion without reported diagnosis    CKD (chronic kidney disease)    COPD (chronic obstructive pulmonary disease) ([place]) 12/23/2019   Diabetic peripheral neuropathy associated with type 2 diabetes mellitus ([place]) 04/07/2021   Gastritis    GERD (gastroesophageal reflux disease)    Glaucoma    History of blood in urine    Hyperlipidemia    Hypertension    Neuropathy    both feet   Ocular hypertension    Osteoporosis    Stroke ([place])    Type 2 diabetes mellitus ([place]) 12/23/2019   Vitamin B12 deficiency anemia due to intrinsic factor deficiency     Past Surgical History:  Procedure Laterality Date   CHOLECYSTECTOMY  2008   COLONOSCOPY  04/30/2011   Procedure: COLONOSCOPY;  Surgeon: Rogene Houston, MD;  [facility];  Service: Endoscopy;  Laterality: N/A;   COLONOSCOPY  05/25/2012   Procedure: COLONOSCOPY;  Surgeon: Rogene Houston, MD;  [facility];  Service: Endoscopy;  Laterality: N/A;  1:25-changed to 1200 Ann to notify pt   COLONOSCOPY Bilateral 12/2017   COLONOSCOPY WITH  PROPOFOL N/A 11/29/2020   Procedure: COLONOSCOPY WITH PROPOFOL;  Surgeon: Harvel Quale, MD;  [facility];  Service: Gastroenterology;  Laterality: N/A;  1:35 [facility] is in the [facility]   ESOPHAGOGASTRODUODENOSCOPY N/A 12/07/2019   Procedure: ESOPHAGOGASTRODUODENOSCOPY (EGD);  Surgeon: Rogene Houston, MD;  [facility];  Service: Endoscopy;  Laterality: N/A;  155   GALLBLADDER SURGERY     right breast cystectomy  1988   TUBAL LIGATION  1975    Social History   Socioeconomic History   Marital status: Divorced    Spouse name: Not on file   Number of children: 4   Years of education: 9   Highest education level: Not on file  Occupational History   Occupation: retired   Tobacco Use   Smoking status: Former    Packs/day: 1.00    Years: 40.00    Total pack years: 40.00    Types: Cigarettes    Quit date: 04/03/2011    Years since quitting: 10.9   Smokeless tobacco: Never  Vaping Use   Vaping Use: Never used  Substance and Sexual Activity   Alcohol use: No   Drug use: No   Sexual activity: Not Currently  Other Topics Concern   Not on file  Social History Narrative   01/17/20 lives  at [place], [place]   Social Determinants of Health   Financial Resource Strain: Not on file  Food Insecurity: Not on file  Transportation Needs: Not on file  Physical Activity: Not on file  Stress: Not on file  Social Connections: Not on file  Intimate Partner Violence: Not on file   Family History  Problem Relation Age of Onset   Diabetes Mother    Hyperlipidemia Mother    Hypertension Mother    Heart disease Father    Hyperlipidemia Father    Diabetes Sister    Hyperlipidemia Sister    Hypertension Sister    Cancer Brother    Heart disease Brother    Hyperlipidemia Brother    Hypertension Brother    Diabetes Sister       VITAL SIGNS BP 101/66   Pulse (!) 59   Temp 97.8 F (36.6 C)   Resp 18   Ht '5\' 4"'$  (1.626 m)   Wt 109 lb  3.2 oz (49.5 kg)   SpO2 100%   BMI 18.74 kg/m   Outpatient Encounter Medications as of 03/23/2022  Medication Sig   acetaminophen (TYLENOL) 325 MG tablet Take 2 tablets (650 mg total) by mouth every 6 (six) hours as needed for mild pain (or Fever >/= 101).   albuterol (VENTOLIN HFA) 108 (90 Base) MCG/ACT inhaler Inhale 2 puffs into the lungs every 4 (four) hours as needed for wheezing or shortness of breath.   amLODipine (NORVASC) 5 MG tablet Take 5 mg by mouth daily.   aspirin EC 81 MG tablet Take 81 mg by mouth daily. Swallow whole.   Balsam [place]-Castor Oil (VENELEX) OINT Apply 1 application topically as needed. Apply to sacrum and bilateral buttocks Every Shift - PRN; PRN 1, PRN 2, PRN 3   benzocaine-menthol (SORE THROAT) 6-10 MG lozenge Take 1 lozenge by mouth as needed for sore throat.   cyanocobalamin (,VITAMIN B-12,) 1000 MCG/ML injection Inject 1 mL (1,000 mcg total) into the muscle every 30 (thirty) days.   docusate sodium (COLACE) 100 MG capsule Take 1 capsule (100 mg total) by mouth 2 (two) times daily.   dorzolamide (TRUSOPT) 2 % ophthalmic solution Place 1 drop into both eyes 2 (two) times daily. (0900 & 1700)   ergocalciferol (VITAMIN D2) 1.25 MG (50000 UT) capsule Take 50,000 Units by mouth once a week. Thursday   folic acid (FOLVITE) 1 MG tablet Take 1 mg by mouth daily. (0900)   gabapentin (NEURONTIN) 100 MG capsule Take 100 mg by mouth at bedtime. (2100)   Iron, Ferrous Sulfate, 325 (65 Fe) MG TABS Take 1 tablet by mouth daily.   loperamide (IMODIUM A-D) 2 MG tablet Take 2 mg by mouth every 4 (four) hours as needed for diarrhea or loose stools.   losartan (COZAAR) 25 MG tablet Take 25 mg by mouth daily.   metoCLOPramide (REGLAN) 5 MG tablet Take 1 tablet (5 mg total) by mouth 3 (three) times daily before meals.   Netarsudil-Latanoprost (ROCKLATAN) 0.02-0.005 % SOLN Apply 1 drop to eye every evening.   NON FORMULARY Diet:Regular thin liquids  ALLERGIC TO FISH   omeprazole  (PRILOSEC) 40 MG capsule Take 40 mg by mouth daily at 6 (six) AM. (0600)   ondansetron (ZOFRAN) 4 MG tablet Take 4 mg by mouth every 6 (six) hours as needed for nausea or vomiting.   rosuvastatin (CRESTOR) 10 MG tablet Take 10 mg by mouth daily.   sodium chloride 1 g tablet Take 1 g by  mouth 2 (two) times daily with a meal.   No facility-administered encounter medications on file as of 03/23/2022.     SIGNIFICANT DIAGNOSTIC EXAMS  PREVIOUS  05-24-20: dexa: t score -2.387  NO NEW EXAMS.     LABS REVIEWED PREVIOUS    03-25-21: wbc 8.8; hgb 11.4; hct 36.7; mcv 94.6 plt 203  04-22-21: wbc 7.5; hgb 11.3; hct 36.5; mcv 93.4 plt 165 05-06-21: glucose 91; bun 25; creat 1.17; k+ 4.2; na++ 135; ca 8.9; liver normal albumin 3.9; hgb a1c 5.3; chol 210; ldl 139; trig 66; hdl 58; vit D 41.14 05-20-21: wbc 8.4; hgb 12.2; hct 40.3; mcv 95.3 plt 176; vit B 12: 777 iron 56; tibc 215; ferritin 284 folate>100.0  06-25-21: glucose 82; bun 31; creat 1.26; k+ 4.6; na++ 138; ca 9.1; GFR 44  08-31-21: wbc 6.5; hgb 10.4; hct 32.5; mcv 93.9 plt 181; glucose 118; bun 24; creat 1.59; k+ 4.2; na++ 133; ca 9.0; gfr 38 phos 4.7; albumin 3.9  09-22-21: wbc 6.3; hgb 9.2; hct 29.4; mcv 94.8 plt 182; glucose 91; bun 32; creat 1.43; k 4.1; na++ 133; ca 9.0; GFR 38; phos 4.7; albumin 3.9 10-13-21: urine micro-albumin 12.8 11-10-21: chol 149; ldl 83; trig 80 hdl 50 01-01-22: hgb a1c 5.4 01-14-22: wbc 5.8; hgb 10.2; hct 30.8; mc 89.8 plt 1409; glucose 101; bun 17; creat 1.14; k+ 4.4; na++ 128; ca 8.8; gfr 50; phos 3.5 albumin 3.7  01-22-22: glucose 78; bun 16; creat 1.13; k+ 4.5; na++ 134; ca 9.0; gfr 50  02-04-22: wbc 8.4; hgb 11.5; hct 36.1; mcv 93.p plt 211; glucose 121; bun 17; creat 1.21; k+ 4.7; na++ 134; ca 9.5; gfr 46; protein 8.3; albumin 4.4; vitamin B 12; 913; folate >40; iron 28; tibc 210   NO NEW LABS.   Review of Systems  Constitutional:  Negative for malaise/fatigue.  Respiratory:  Negative for cough and shortness of  breath.   Cardiovascular:  Negative for chest pain, palpitations and leg swelling.  Gastrointestinal:  Negative for abdominal pain, constipation and heartburn.  Musculoskeletal:  Positive for myalgias. Negative for back pain and joint pain.       Left groin pain; sharp in nature   Skin: Negative.   Neurological:  Negative for dizziness.  Psychiatric/Behavioral:  The patient is not nervous/anxious.    Physical Exam Constitutional:      General: She is not in acute distress.    Appearance: She is well-developed. She is not diaphoretic.  Neck:     Thyroid: No thyromegaly.  Cardiovascular:     Rate and Rhythm: Normal rate and regular rhythm.     Pulses: Normal pulses.     Heart sounds: Normal heart sounds.  Pulmonary:     Effort: Pulmonary effort is normal. No respiratory distress.     Breath sounds: Normal breath sounds.  Abdominal:     General: Bowel sounds are normal. There is no distension.     Palpations: Abdomen is soft.     Tenderness: There is no abdominal tenderness.  Musculoskeletal:        General: Normal range of motion.     Cervical back: Neck supple.     Right lower leg: No edema.     Left lower leg: No edema.  Lymphadenopathy:     Cervical: No cervical adenopathy.  Skin:    General: Skin is warm and dry.  Neurological:     Mental Status: She is alert and oriented to person, place, and time.  Psychiatric:  Mood and Affect: Mood normal.        ASSESSMENT/ PLAN:  TODAY  Diabetic peripheral neuropathy associated with type 2 diabetes mellitus: will increase gabapentin to 100 mg twice daily    Ok Edwards NP [facility]   call [phone]

## 2022-03-24 ENCOUNTER — Ambulatory Visit (HOSPITAL_COMMUNITY)
Admission: RE | Admit: 2022-03-24 | Discharge: 2022-03-24 | Disposition: A | Discharge status: Home / Self Care | Payer: Medicare Other | Source: Ambulatory Visit | Attending: Adult Health | Admitting: Adult Health

## 2022-03-24 DIAGNOSIS — Z86718 Personal history of other venous thrombosis and embolism: Secondary | ICD-10-CM | POA: Insufficient documentation

## 2022-03-24 DIAGNOSIS — R1030 Lower abdominal pain, unspecified: Secondary | ICD-10-CM | POA: Diagnosis not present

## 2022-04-09 DIAGNOSIS — Z1383 Encounter for screening for respiratory disorder NEC: Secondary | ICD-10-CM | POA: Diagnosis not present

## 2022-04-09 DIAGNOSIS — I82402 Acute embolism and thrombosis of unspecified deep veins of left lower extremity: Secondary | ICD-10-CM | POA: Diagnosis not present

## 2022-04-09 DIAGNOSIS — Z1159 Encounter for screening for other viral diseases: Secondary | ICD-10-CM | POA: Diagnosis not present

## 2022-04-14 ENCOUNTER — Encounter: Payer: Self-pay | Admitting: Adult Health

## 2022-04-14 ENCOUNTER — Non-Acute Institutional Stay (SKILLED_NURSING_FACILITY): Payer: Medicare Other | Admitting: Adult Health

## 2022-04-14 DIAGNOSIS — Z23 Encounter for immunization: Secondary | ICD-10-CM | POA: Diagnosis not present

## 2022-04-14 DIAGNOSIS — E538 Deficiency of other specified B group vitamins: Secondary | ICD-10-CM | POA: Diagnosis not present

## 2022-04-14 DIAGNOSIS — E069 Thyroiditis, unspecified: Secondary | ICD-10-CM | POA: Diagnosis not present

## 2022-04-14 DIAGNOSIS — G959 Disease of spinal cord, unspecified: Secondary | ICD-10-CM

## 2022-04-14 DIAGNOSIS — E559 Vitamin D deficiency, unspecified: Secondary | ICD-10-CM

## 2022-04-14 NOTE — Progress Notes (Unsigned)
Location:  [place] Room Number: 150-P Place of Service:  SNF (31)   CODE STATUS: DNR  Allergies  Allergen Reactions   Latex Rash   Levofloxacin Nausea And Vomiting   Neomycin-Bacitracin-Polymyxin  [Bacitracin-Neomycin-Polymyxin] Rash   Other Swelling and Rash    TOMATO  SOUP-PER MAR   Prednisone Hives, Itching, Rash and Other (See Comments)   Fish Allergy     rash   Shellfish Allergy Hives   Neosporin [Neomycin-Polymyxin-Gramicidin] Itching and Rash    Chief Complaint  Patient presents with   Medical Management of Chronic Issues                                Vitamin D deficiency: Thyroiditis: Cervical myelopathy: Failure to thrive in adult:    HPI:  She is a 76 year old long term resident of this facility being seen for the management of her chronic illnesses:   Vitamin D deficiency: Thyroiditis: Cervical myelopathy: Failure to thrive in adult. Her weight is stable. She denies any uncontrolled pain. She denies any insomnia. No cough or shortness of breath   Past Medical History:  Diagnosis Date   Allergy    Anemia    pernicious anemia   Arthritis    Asthma    Blood transfusion without reported diagnosis    CKD (chronic kidney disease)    COPD (chronic obstructive pulmonary disease) ([place]) 12/23/2019   Diabetic peripheral neuropathy associated with type 2 diabetes mellitus ([place]) 04/07/2021   Gastritis    GERD (gastroesophageal reflux disease)    Glaucoma    History of blood in urine    Hyperlipidemia    Hypertension    Neuropathy    both feet   Ocular hypertension    Osteoporosis    Stroke ([place])    Type 2 diabetes mellitus ([place]) 12/23/2019   Vitamin B12 deficiency anemia due to intrinsic factor deficiency     Past Surgical History:  Procedure Laterality Date   CHOLECYSTECTOMY  2008   COLONOSCOPY  04/30/2011   Procedure: COLONOSCOPY;  Surgeon: Rogene Houston, MD;  [facility];  Service: Endoscopy;  Laterality: N/A;    COLONOSCOPY  05/25/2012   Procedure: COLONOSCOPY;  Surgeon: Rogene Houston, MD;  [facility];  Service: Endoscopy;  Laterality: N/A;  1:25-changed to 1200 Ann to notify pt   COLONOSCOPY Bilateral 12/2017   COLONOSCOPY WITH PROPOFOL N/A 11/29/2020   Procedure: COLONOSCOPY WITH PROPOFOL;  Surgeon: Harvel Quale, MD;  [facility];  Service: Gastroenterology;  Laterality: N/A;  1:35 [facility] is in the [facility]   ESOPHAGOGASTRODUODENOSCOPY N/A 12/07/2019   Procedure: ESOPHAGOGASTRODUODENOSCOPY (EGD);  Surgeon: Rogene Houston, MD;  [facility];  Service: Endoscopy;  Laterality: N/A;  155   GALLBLADDER SURGERY     right breast cystectomy  1988   TUBAL LIGATION  1975    Social History   Socioeconomic History   Marital status: Divorced    Spouse name: Not on file   Number of children: 4   Years of education: 9   Highest education level: Not on file  Occupational History   Occupation: retired   Tobacco Use   Smoking status: Former    Packs/day: 1.00    Years: 40.00    Total pack years: 40.00    Types: Cigarettes    Quit date: 04/03/2011    Years since quitting: 17.0  Smokeless tobacco: Never  Vaping Use   Vaping Use: Never used  Substance and Sexual Activity   Alcohol use: No   Drug use: No   Sexual activity: Not Currently  Other Topics Concern   Not on file  Social History Narrative   01/17/[birth year] lives at [facility], [place]   Social Determinants of Health   Financial Resource Strain: Not on file  Food Insecurity: Not on file  Transportation Needs: Not on file  Physical Activity: Not on file  Stress: Not on file  Social Connections: Not on file  Intimate Partner Violence: Not on file   Family History  Problem Relation Age of Onset   Diabetes Mother    Hyperlipidemia Mother    Hypertension Mother    Heart disease Father    Hyperlipidemia Father    Diabetes Sister    Hyperlipidemia Sister    Hypertension Sister     Cancer Brother    Heart disease Brother    Hyperlipidemia Brother    Hypertension Brother    Diabetes Sister       VITAL SIGNS BP 138/76   Pulse 73   Temp 97.6 F (36.4 C)   Resp 18   Ht '5\' 4"'$  (1.626 m)   Wt 109 lb 3.2 oz (49.5 kg)   SpO2 96%   BMI 18.74 kg/m   Outpatient Encounter Medications as of 04/14/2022  Medication Sig   acetaminophen (TYLENOL) 325 MG tablet Take 2 tablets (650 mg total) by mouth every 6 (six) hours as needed for mild pain (or Fever >/= 101).   amLODipine (NORVASC) 5 MG tablet Take 5 mg by mouth daily.   aspirin EC 81 MG tablet Take 81 mg by mouth daily. Swallow whole.   cyanocobalamin (,VITAMIN B-12,) 1000 MCG/ML injection Inject 1 mL (1,000 mcg total) into the muscle every 30 (thirty) days.   docusate sodium (COLACE) 100 MG capsule Take 1 capsule (100 mg total) by mouth 2 (two) times daily.   dorzolamide (TRUSOPT) 2 % ophthalmic solution Place 1 drop into both eyes 2 (two) times daily. (0900 & 1700)   ergocalciferol (VITAMIN D2) 1.25 MG (50000 UT) capsule Take 50,000 Units by mouth once a week. Thursday   folic acid (FOLVITE) 1 MG tablet Take 1 mg by mouth daily. (0900)   gabapentin (NEURONTIN) 100 MG capsule Take 100 mg by mouth 2 (two) times daily. (2100)   Iron, Ferrous Sulfate, 325 (65 Fe) MG TABS Take 1 tablet by mouth daily.   losartan (COZAAR) 25 MG tablet Take 25 mg by mouth daily.   metoCLOPramide (REGLAN) 5 MG tablet Take 1 tablet (5 mg total) by mouth 3 (three) times daily before meals.   Netarsudil-Latanoprost (ROCKLATAN) 0.02-0.005 % SOLN Apply 1 drop to eye every evening.   NON FORMULARY Diet:Regular thin liquids  ALLERGIC TO FISH   omeprazole (PRILOSEC) 40 MG capsule Take 40 mg by mouth daily at 6 (six) AM. (0600)   rosuvastatin (CRESTOR) 10 MG tablet Take 10 mg by mouth daily.   sodium chloride 1 g tablet Take 1 g by mouth 2 (two) times daily with a meal.   No facility-administered encounter medications on file as of 04/14/2022.      SIGNIFICANT DIAGNOSTIC EXAMS  PREVIOUS  05-24-20: dexa: t score -2.387  NO NEW EXAMS.     LABS REVIEWED PREVIOUS    04-22-21: wbc 7.5; hgb 11.3; hct 36.5; mcv 93.4 plt 165 05-06-21: glucose 91; bun 25; creat 1.17; k+ 4.2; na++ 135;  ca 8.9; liver normal albumin 3.9; hgb a1c 5.3; chol 210; ldl 139; trig 66; hdl 58; vit D 41.14 05-20-21: wbc 8.4; hgb 12.2; hct 40.3; mcv 95.3 plt 176; vit B 12: 777 iron 56; tibc 215; ferritin 284 folate>100.0  06-25-21: glucose 82; bun 31; creat 1.26; k+ 4.6; na++ 138; ca 9.1; GFR 44  08-31-21: wbc 6.5; hgb 10.4; hct 32.5; mcv 93.9 plt 181; glucose 118; bun 24; creat 1.59; k+ 4.2; na++ 133; ca 9.0; gfr 38 phos 4.7; albumin 3.9  09-22-21: wbc 6.3; hgb 9.2; hct 29.4; mcv 94.8 plt 182; glucose 91; bun 32; creat 1.43; k 4.1; na++ 133; ca 9.0; GFR 38; phos 4.7; albumin 3.9 10-13-21: urine micro-albumin 12.8 11-10-21: chol 149; ldl 83; trig 80 hdl 50 01-01-22: hgb a1c 5.4 01-14-22: wbc 5.8; hgb 10.2; hct 30.8; mc 89.8 plt 1409; glucose 101; bun 17; creat 1.14; k+ 4.4; na++ 128; ca 8.8; gfr 50; phos 3.5 albumin 3.7  01-22-22: glucose 78; bun 16; creat 1.13; k+ 4.5; na++ 134; ca 9.0; gfr 50  02-04-22: wbc 8.4; hgb 11.5; hct 36.1; mcv 93.p plt 211; glucose 121; bun 17; creat 1.21; k+ 4.7; na++ 134; ca 9.5; gfr 46; protein 8.3; albumin 4.4; vitamin B 12; 913; folate >40; iron 28; tibc 210   TODAY  02-05-22: tsh 1.858; chol 117; ldl 63 trig 38 hdl 46   Review of Systems  Constitutional:  Negative for malaise/fatigue.  Respiratory:  Negative for cough and shortness of breath.   Cardiovascular:  Negative for chest pain, palpitations and leg swelling.  Gastrointestinal:  Negative for abdominal pain, constipation and heartburn.  Musculoskeletal:  Negative for back pain, joint pain and myalgias.  Skin: Negative.   Neurological:  Negative for dizziness.  Psychiatric/Behavioral:  The patient is not nervous/anxious.     Physical Exam Constitutional:      General: She is  not in acute distress.    Appearance: She is underweight. She is not diaphoretic.  Neck:     Thyroid: No thyromegaly.  Cardiovascular:     Rate and Rhythm: Normal rate and regular rhythm.     Pulses: Normal pulses.     Heart sounds: Normal heart sounds.  Pulmonary:     Effort: Pulmonary effort is normal. No respiratory distress.     Breath sounds: Normal breath sounds.  Abdominal:     General: Bowel sounds are normal. There is no distension.     Palpations: Abdomen is soft.     Tenderness: There is no abdominal tenderness.  Musculoskeletal:        General: Normal range of motion.     Cervical back: Neck supple.     Right lower leg: No edema.     Left lower leg: No edema.  Lymphadenopathy:     Cervical: No cervical adenopathy.  Skin:    General: Skin is warm and dry.  Neurological:     Mental Status: She is alert and oriented to person, place, and time.  Psychiatric:        Mood and Affect: Mood normal.         ASSESSMENT/ PLAN:  TODAY    Vitamin D deficiency: level 41.14 will continue 50,000 units weekly   2. Thyroiditis: tsh 2.291 free t4: 0.96  3. Cervical myelopathy: will monitor   4. Failure to thrive in adult: weight is 109 pounds; will continue supplements as directed; no megace due to dvt.   PREVIOUS   5. Aortic atherosclerosis: (ct 12-23-19): will monitor  6. 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 Chronic obstructive pulmonary disease unspecified COPD type: will monitor   8. CKD stage 3 due to type 2 diabetes mellitus: bun 17; creat 1.21; gfr 46   9. Diabetic peripheral neuropathy associated with type 2 diabetes mellitus: will continue gabapentin 100 mg twice daily   10. Type 2 diabetes mellitus with stage 3 chronic kidney disease without long term current use of insulin hgb a1c 5.3; on asa 81 mg daily   11. Other chronic gastritis without hemorrhage: will continue prilosec 40 mg daily   reglan 5 mg prior to meals.   12. Hypertension associated with stage 3 chronic kidney disease due to type 2 diabetes mellitus: b/p 107/58 will continue  norvasc  5 mg daily   13. Anemia due to chronic renal failure treated with erythropoietin stage 3; hgb 11.5 is followed by oncology for injections is on iron daily   14. Increased intraocular pressure bilateral: stable will continue trusopt to both eyes twice daily and rocklatan to both eyes nightly   15. Vitamin B 12 deficiency: stable level 913 ; folate >40 will continue monthly injections and folic acid 1 mg daily        Ok Edwards NP [facility]   call [phone]

## 2022-04-20 DIAGNOSIS — I1310 Hypertensive heart and chronic kidney disease without heart failure, with stage 1 through stage 4 chronic kidney disease, or unspecified chronic kidney disease: Secondary | ICD-10-CM | POA: Diagnosis not present

## 2022-04-20 DIAGNOSIS — Z1159 Encounter for screening for other viral diseases: Secondary | ICD-10-CM | POA: Diagnosis not present

## 2022-04-29 DIAGNOSIS — R279 Unspecified lack of coordination: Secondary | ICD-10-CM | POA: Diagnosis not present

## 2022-04-29 DIAGNOSIS — M4802 Spinal stenosis, cervical region: Secondary | ICD-10-CM | POA: Diagnosis not present

## 2022-04-29 DIAGNOSIS — R262 Difficulty in walking, not elsewhere classified: Secondary | ICD-10-CM | POA: Diagnosis not present

## 2022-04-29 DIAGNOSIS — Z9181 History of falling: Secondary | ICD-10-CM | POA: Diagnosis not present

## 2022-04-30 DIAGNOSIS — R279 Unspecified lack of coordination: Secondary | ICD-10-CM | POA: Diagnosis not present

## 2022-04-30 DIAGNOSIS — Z9181 History of falling: Secondary | ICD-10-CM | POA: Diagnosis not present

## 2022-04-30 DIAGNOSIS — M4802 Spinal stenosis, cervical region: Secondary | ICD-10-CM | POA: Diagnosis not present

## 2022-04-30 DIAGNOSIS — R262 Difficulty in walking, not elsewhere classified: Secondary | ICD-10-CM | POA: Diagnosis not present

## 2022-05-01 DIAGNOSIS — R262 Difficulty in walking, not elsewhere classified: Secondary | ICD-10-CM | POA: Diagnosis not present

## 2022-05-01 DIAGNOSIS — Z9181 History of falling: Secondary | ICD-10-CM | POA: Diagnosis not present

## 2022-05-01 DIAGNOSIS — R279 Unspecified lack of coordination: Secondary | ICD-10-CM | POA: Diagnosis not present

## 2022-05-01 DIAGNOSIS — M4802 Spinal stenosis, cervical region: Secondary | ICD-10-CM | POA: Diagnosis not present

## 2022-05-04 DIAGNOSIS — M4802 Spinal stenosis, cervical region: Secondary | ICD-10-CM | POA: Diagnosis not present

## 2022-05-04 DIAGNOSIS — R279 Unspecified lack of coordination: Secondary | ICD-10-CM | POA: Diagnosis not present

## 2022-05-04 DIAGNOSIS — R262 Difficulty in walking, not elsewhere classified: Secondary | ICD-10-CM | POA: Diagnosis not present

## 2022-05-04 DIAGNOSIS — Z9181 History of falling: Secondary | ICD-10-CM | POA: Diagnosis not present

## 2022-05-05 DIAGNOSIS — M4802 Spinal stenosis, cervical region: Secondary | ICD-10-CM | POA: Diagnosis not present

## 2022-05-05 DIAGNOSIS — R279 Unspecified lack of coordination: Secondary | ICD-10-CM | POA: Diagnosis not present

## 2022-05-05 DIAGNOSIS — Z9181 History of falling: Secondary | ICD-10-CM | POA: Diagnosis not present

## 2022-05-05 DIAGNOSIS — R262 Difficulty in walking, not elsewhere classified: Secondary | ICD-10-CM | POA: Diagnosis not present

## 2022-05-06 DIAGNOSIS — R279 Unspecified lack of coordination: Secondary | ICD-10-CM | POA: Diagnosis not present

## 2022-05-06 DIAGNOSIS — Z1211 Encounter for screening for malignant neoplasm of colon: Secondary | ICD-10-CM | POA: Diagnosis not present

## 2022-05-06 DIAGNOSIS — M4802 Spinal stenosis, cervical region: Secondary | ICD-10-CM | POA: Diagnosis not present

## 2022-05-06 DIAGNOSIS — Z9181 History of falling: Secondary | ICD-10-CM | POA: Diagnosis not present

## 2022-05-06 DIAGNOSIS — R262 Difficulty in walking, not elsewhere classified: Secondary | ICD-10-CM | POA: Diagnosis not present

## 2022-05-06 DIAGNOSIS — Z1212 Encounter for screening for malignant neoplasm of rectum: Secondary | ICD-10-CM | POA: Diagnosis not present

## 2022-05-07 ENCOUNTER — Non-Acute Institutional Stay (SKILLED_NURSING_FACILITY): Payer: Medicare Other | Admitting: Adult Health

## 2022-05-07 DIAGNOSIS — K2950 Unspecified chronic gastritis without bleeding: Secondary | ICD-10-CM | POA: Diagnosis not present

## 2022-05-07 DIAGNOSIS — D631 Anemia in chronic kidney disease: Secondary | ICD-10-CM

## 2022-05-07 DIAGNOSIS — G959 Disease of spinal cord, unspecified: Secondary | ICD-10-CM | POA: Diagnosis not present

## 2022-05-07 DIAGNOSIS — Z9181 History of falling: Secondary | ICD-10-CM | POA: Diagnosis not present

## 2022-05-07 DIAGNOSIS — E1142 Type 2 diabetes mellitus with diabetic polyneuropathy: Secondary | ICD-10-CM | POA: Diagnosis not present

## 2022-05-07 DIAGNOSIS — N183 Chronic kidney disease, stage 3 unspecified: Secondary | ICD-10-CM

## 2022-05-07 DIAGNOSIS — J449 Chronic obstructive pulmonary disease, unspecified: Secondary | ICD-10-CM | POA: Diagnosis not present

## 2022-05-07 DIAGNOSIS — R262 Difficulty in walking, not elsewhere classified: Secondary | ICD-10-CM | POA: Diagnosis not present

## 2022-05-07 DIAGNOSIS — I700 Atherosclerosis of aorta: Secondary | ICD-10-CM

## 2022-05-07 DIAGNOSIS — E069 Thyroiditis, unspecified: Secondary | ICD-10-CM | POA: Diagnosis not present

## 2022-05-07 DIAGNOSIS — R279 Unspecified lack of coordination: Secondary | ICD-10-CM | POA: Diagnosis not present

## 2022-05-07 DIAGNOSIS — E1122 Type 2 diabetes mellitus with diabetic chronic kidney disease: Secondary | ICD-10-CM | POA: Diagnosis not present

## 2022-05-07 DIAGNOSIS — M4802 Spinal stenosis, cervical region: Secondary | ICD-10-CM | POA: Diagnosis not present

## 2022-05-07 DIAGNOSIS — I129 Hypertensive chronic kidney disease with stage 1 through stage 4 chronic kidney disease, or unspecified chronic kidney disease: Secondary | ICD-10-CM | POA: Diagnosis not present

## 2022-05-08 DIAGNOSIS — Z9181 History of falling: Secondary | ICD-10-CM | POA: Diagnosis not present

## 2022-05-08 DIAGNOSIS — R262 Difficulty in walking, not elsewhere classified: Secondary | ICD-10-CM | POA: Diagnosis not present

## 2022-05-08 DIAGNOSIS — R279 Unspecified lack of coordination: Secondary | ICD-10-CM | POA: Diagnosis not present

## 2022-05-08 DIAGNOSIS — M4802 Spinal stenosis, cervical region: Secondary | ICD-10-CM | POA: Diagnosis not present

## 2022-05-11 DIAGNOSIS — R262 Difficulty in walking, not elsewhere classified: Secondary | ICD-10-CM | POA: Diagnosis not present

## 2022-05-11 DIAGNOSIS — M4802 Spinal stenosis, cervical region: Secondary | ICD-10-CM | POA: Diagnosis not present

## 2022-05-11 DIAGNOSIS — Z9181 History of falling: Secondary | ICD-10-CM | POA: Diagnosis not present

## 2022-05-11 DIAGNOSIS — R279 Unspecified lack of coordination: Secondary | ICD-10-CM | POA: Diagnosis not present

## 2022-05-12 ENCOUNTER — Encounter: Payer: Self-pay | Admitting: Adult Health

## 2022-05-12 DIAGNOSIS — R279 Unspecified lack of coordination: Secondary | ICD-10-CM | POA: Diagnosis not present

## 2022-05-12 DIAGNOSIS — M4802 Spinal stenosis, cervical region: Secondary | ICD-10-CM | POA: Diagnosis not present

## 2022-05-12 DIAGNOSIS — Z9181 History of falling: Secondary | ICD-10-CM | POA: Diagnosis not present

## 2022-05-12 DIAGNOSIS — R262 Difficulty in walking, not elsewhere classified: Secondary | ICD-10-CM | POA: Diagnosis not present

## 2022-05-12 NOTE — Progress Notes (Signed)
Provider:Eddis Pingleton NP  Location  [facility]   PCP: Gerlene Fee, NP  Extended Emergency Contact Information Primary Emergency Contact: Kendall Mobile Phone: [phone] Relation: Son Secondary Emergency Contact: Edythe Lynn Address: [place], [place] [address] [place] Phone: [phone] Mobile Phone: [phone] Relation: Daughter  Codes status: dnr  Goals of care: advanced directive information    04/14/2022    1:52 PM  Advanced Directives  Does Patient Have a Medical Advance Directive? Yes  Type of Paramedic of [place];Living will;Out of facility DNR (pink MOST or yellow form)  Does patient want to make changes to medical advance directive? No - Patient declined  Copy of [place] in Chart? Yes - validated most recent copy scanned in chart (See row information)  Pre-existing out of facility DNR order (yellow form or pink MOST form) Pink MOST/Yellow Form most recent copy in chart - Physician notified to receive inpatient order     Allergies  Allergen Reactions   Latex Rash   Levofloxacin Nausea And Vomiting   Neomycin-Bacitracin-Polymyxin  [Bacitracin-Neomycin-Polymyxin] Rash   Other Swelling and Rash    TOMATO  SOUP-PER MAR   Prednisone Hives, Itching, Rash and Other (See Comments)   Fish Allergy     rash   Shellfish Allergy Hives   Neosporin [Neomycin-Polymyxin-Gramicidin] Itching and Rash    Chief Complaint  Patient presents with   Annual Exam    HPI  She is a 76 year old long term resident of this facility being seen for her annual exam. She has not required any hospitalizations or ED visits. She continues to get out of bed daily. She does participate in activities. Her weight is stable. Her pain is managed. She continues to be followed for her chronic illnesses including:  Chronic obstructive pulmonary disease unspecified COPD type: CKD stage 3  due to type 2 diabetes mellitus:  Diabetic peripheral neuropathy associated with type 2 diabetes mellitus:  Other chronic gastritis without hemorrhage:    Past Medical History:  Diagnosis Date   Allergy    Anemia    pernicious anemia   Arthritis    Asthma    Blood transfusion without reported diagnosis    CKD (chronic kidney disease)    COPD (chronic obstructive pulmonary disease) ([place]) 12/23/2019   Diabetic peripheral neuropathy associated with type 2 diabetes mellitus ([place]) 04/07/2021   Gastritis    GERD (gastroesophageal reflux disease)    Glaucoma    History of blood in urine    Hyperlipidemia    Hypertension    Neuropathy    both feet   Ocular hypertension    Osteoporosis    Stroke ([place])    Type 2 diabetes mellitus ([place]) 12/23/2019   Vitamin B12 deficiency anemia due to intrinsic factor deficiency    Past Surgical History:  Procedure Laterality Date   CHOLECYSTECTOMY  2008   COLONOSCOPY  04/30/2011   Procedure: COLONOSCOPY;  Surgeon: Rogene Houston, MD;  [facility];  Service: Endoscopy;  Laterality: N/A;   COLONOSCOPY  05/25/2012   Procedure: COLONOSCOPY;  Surgeon: Rogene Houston, MD;  [facility];  Service: Endoscopy;  Laterality: N/A;  1:25-changed to 1200 Ann to notify pt   COLONOSCOPY Bilateral 12/2017   COLONOSCOPY WITH PROPOFOL N/A 11/29/2020   Procedure: COLONOSCOPY WITH PROPOFOL;  Surgeon: Harvel Quale, MD;  [facility];  Service: Gastroenterology;  Laterality: N/A;  1:35 patient is in the [facility]   ESOPHAGOGASTRODUODENOSCOPY N/A 12/07/2019   Procedure: ESOPHAGOGASTRODUODENOSCOPY (EGD);  Surgeon: Rogene Houston, MD;  [facility];  Service: Endoscopy;  Laterality: N/A;  155   GALLBLADDER SURGERY     right breast cystectomy  [place]    reports that she quit smoking about 11 years ago. Her smoking use included cigarettes. She has a 40.00 pack-year smoking history. She has  never used smokeless tobacco. She reports that she does not drink alcohol and does not use drugs. Social History   Tobacco Use   Smoking status: Former    Packs/day: 1.00    Years: 40.00    Total pack years: 40.00    Types: Cigarettes    Quit date: 04/03/2011    Years since quitting: 11.1   Smokeless tobacco: Never  Vaping Use   Vaping Use: Never used  Substance Use Topics   Alcohol use: No   Drug use: No   Family History  Problem Relation Age of Onset   Diabetes Mother    Hyperlipidemia Mother    Hypertension Mother    Heart disease Father    Hyperlipidemia Father    Diabetes Sister    Hyperlipidemia Sister    Hypertension Sister    Cancer Brother    Heart disease Brother    Hyperlipidemia Brother    Hypertension Brother    Diabetes Sister     Pertinent  Health Maintenance Due  Topic Date Due   OPHTHALMOLOGY EXAM  Never done   INFLUENZA VACCINE  02/10/2022   HEMOGLOBIN A1C  07/03/2022   FOOT EXAM  08/01/2022   DEXA SCAN  Completed   COLONOSCOPY (Pts 45-23yr Insurance coverage will need to be confirmed)  Discontinued      08/20/2021   10:37 AM 10/08/2021    2:35 PM 10/10/2021    3:13 PM 10/23/2021    1:56 PM 02/11/2022   11:42 AM  FSegundoin the past year?   0    Was there an injury with Fall?   0    Fall Risk Category Calculator   0    Fall Risk Category   Low    Patient Fall Risk Level High fall risk High fall risk Low fall risk Moderate fall risk High fall risk  Patient at Risk for Falls Due to   Impaired balance/gait;Impaired mobility        10/10/2021    3:13 PM 08/04/2021    1:13 PM 05/12/2021   11:47 AM 05/10/2020    2:25 PM 12/18/2019   10:11 AM  Depression screen PHQ 2/9  Decreased Interest 0 0 0 0 0  Down, Depressed, Hopeless 0 0 0 0 0  PHQ - 2 Score 0 0 0 0 0  Altered sleeping 0 0     Tired, decreased energy 0 0     Change in appetite 0 0     Feeling bad or failure about yourself  0 0     Trouble concentrating 0 0     Moving slowly  or fidgety/restless 0 0     Suicidal thoughts 0 0     PHQ-9 Score 0 0       Functional Status Survey:    Outpatient Encounter Medications as of 05/07/2022  Medication Sig   acetaminophen (TYLENOL) 325 MG tablet Take 2 tablets (650 mg total) by mouth every 6 (  six) hours as needed for mild pain (or Fever >/= 101).   amLODipine (NORVASC) 5 MG tablet Take 5 mg by mouth daily.   aspirin EC 81 MG tablet Take 81 mg by mouth daily. Swallow whole.   cyanocobalamin (,VITAMIN B-12,) 1000 MCG/ML injection Inject 1 mL (1,000 mcg total) into the muscle every 30 (thirty) days.   docusate sodium (COLACE) 100 MG capsule Take 1 capsule (100 mg total) by mouth 2 (two) times daily.   dorzolamide (TRUSOPT) 2 % ophthalmic solution Place 1 drop into both eyes 2 (two) times daily. (0900 & 1700)   ergocalciferol (VITAMIN D2) 1.25 MG (50000 UT) capsule Take 50,000 Units by mouth once a week. Thursday   folic acid (FOLVITE) 1 MG tablet Take 1 mg by mouth daily. (0900)   gabapentin (NEURONTIN) 100 MG capsule Take 100 mg by mouth 2 (two) times daily. (2100)   Iron, Ferrous Sulfate, 325 (65 Fe) MG TABS Take 1 tablet by mouth daily.   losartan (COZAAR) 25 MG tablet Take 25 mg by mouth daily.   metoCLOPramide (REGLAN) 5 MG tablet Take 1 tablet (5 mg total) by mouth 3 (three) times daily before meals.   Netarsudil-Latanoprost (ROCKLATAN) 0.02-0.005 % SOLN Apply 1 drop to eye every evening.   NON FORMULARY Diet:Regular thin liquids  ALLERGIC TO FISH   omeprazole (PRILOSEC) 40 MG capsule Take 40 mg by mouth daily at 6 (six) AM. (0600)   rosuvastatin (CRESTOR) 10 MG tablet Take 10 mg by mouth daily.   sodium chloride 1 g tablet Take 1 g by mouth 2 (two) times daily with a meal.   No facility-administered encounter medications on file as of 05/07/2022.     Vitals:   05/07/22 1009  BP: 114/64  Pulse: 62  Resp: 18  Temp: (!) 97.2 F (36.2 C)  SpO2: 97%  Weight: 109 lb 3.2 oz (49.5 kg)  Height: '5\' 4"'$  (1.626 m)    Body mass index is 18.74 kg/m.   SIGNIFICANT DIAGNOSTIC EXAMS  PREVIOUS  05-24-20: dexa: t score -2.387  NO NEW EXAMS.     LABS REVIEWED PREVIOUS    04-22-21: wbc 7.5; hgb 11.3; hct 36.5; mcv 93.4 plt 165 05-06-21: glucose 91; bun 25; creat 1.17; k+ 4.2; na++ 135; ca 8.9; liver normal albumin 3.9; hgb a1c 5.3; chol 210; ldl 139; trig 66; hdl 58; vit D 41.14 05-20-21: wbc 8.4; hgb 12.2; hct 40.3; mcv 95.3 plt 176; vit B 12: 777 iron 56; tibc 215; ferritin 284 folate>100.0  06-25-21: glucose 82; bun 31; creat 1.26; k+ 4.6; na++ 138; ca 9.1; GFR 44  08-31-21: wbc 6.5; hgb 10.4; hct 32.5; mcv 93.9 plt 181; glucose 118; bun 24; creat 1.59; k+ 4.2; na++ 133; ca 9.0; gfr 38 phos 4.7; albumin 3.9  09-22-21: wbc 6.3; hgb 9.2; hct 29.4; mcv 94.8 plt 182; glucose 91; bun 32; creat 1.43; k 4.1; na++ 133; ca 9.0; GFR 38; phos 4.7; albumin 3.9 10-13-21: urine micro-albumin 12.8 11-10-21: chol 149; ldl 83; trig 80 hdl 50 01-01-22: hgb a1c 5.4 01-14-22: wbc 5.8; hgb 10.2; hct 30.8; mc 89.8 plt 1409; glucose 101; bun 17; creat 1.14; k+ 4.4; na++ 128; ca 8.8; gfr 50; phos 3.5 albumin 3.7  01-22-22: glucose 78; bun 16; creat 1.13; k+ 4.5; na++ 134; ca 9.0; gfr 50  02-04-22: wbc 8.4; hgb 11.5; hct 36.1; mcv 93.p plt 211; glucose 121; bun 17; creat 1.21; k+ 4.7; na++ 134; ca 9.5; gfr 46; protein 8.3; albumin 4.4; vitamin B  12; 913; folate >40; iron 28; tibc 210  02-05-22: tsh 1.858; chol 117; ldl 63 trig 38 hdl 46   NO NEW LABS.   Review of Systems  Constitutional:  Negative for malaise/fatigue.  Respiratory:  Negative for cough and shortness of breath.   Cardiovascular:  Negative for chest pain, palpitations and leg swelling.  Gastrointestinal:  Negative for abdominal pain, constipation and heartburn.  Musculoskeletal:  Negative for back pain, joint pain and myalgias.  Skin: Negative.   Neurological:  Negative for dizziness.  Psychiatric/Behavioral:  The patient is not nervous/anxious.    Physical  Exam Constitutional:      General: She is not in acute distress.    Appearance: She is underweight. She is not diaphoretic.  HENT:     Nose: Nose normal.     Mouth/Throat:     Mouth: Mucous membranes are moist.     Pharynx: Oropharynx is clear.  Eyes:     Conjunctiva/sclera: Conjunctivae normal.  Neck:     Thyroid: No thyromegaly.  Cardiovascular:     Rate and Rhythm: Normal rate and regular rhythm.     Heart sounds: Normal heart sounds.  Pulmonary:     Effort: Pulmonary effort is normal. No respiratory distress.     Breath sounds: Normal breath sounds.  Abdominal:     General: Bowel sounds are normal. There is no distension.     Palpations: Abdomen is soft.     Tenderness: There is no abdominal tenderness.  Musculoskeletal:        General: Normal range of motion.     Cervical back: Neck supple.     Right lower leg: No edema.     Left lower leg: No edema.  Lymphadenopathy:     Cervical: No cervical adenopathy.  Skin:    General: Skin is warm and dry.  Neurological:     Mental Status: She is alert and oriented to person, place, and time.  Psychiatric:        Mood and Affect: Mood normal.        ASSESSMENT/ PLAN:  TODAY    Aortic atherosclerosis (ct 12-23-19) will monitor   2. 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 Chronic obstructive pulmonary disease unspecified COPD type: will monitor   4. CKD stage 3 due to type 2 diabetes mellitus: bun 17; creat 1.21; gfr 46   5. Diabetic peripheral neuropathy associated with type 2 diabetes mellitus: will continue gabapentin twice daily   6. Other chronic gastritis without hemorrhage: will continue prilosec 40 mg daily; reglan 5 mg prior to meals  7. Type 2 diabetes mellitus with stage 3 chronic kidney disease without long term current use of insulin: hgb a1c 5.3; asa 81 mg daily is on statin and arb  8. Hyperlipidemia associated with type 2 diabetes  mellitus: ldl 63 will continue crestor 10 mg daily   9. Hypertension associated with stage 3 chronic kidney disease due to type 2 diabetes mellitus: b/p 114/64 will continue norvasc 5 mg daily  cozaar 25 mg daily   10. Anemia due to chronic renal failure treated with erythropoietin stage 3: hgb 11.5 will continue iron daily; is seen by oncology for injections.  11. Increased intraocular pressure bilateral: will continue trusopt to both eyes twice daily and rocklatan to both eyes nightly   12. Vitamin B 12 deficiency: stable level 913; folate >40; will continue monthly injections and folic  acid 1 mg daily   13. Vitamin D deficiency: level 41.14 will continue 50,000 units weekly   14. Thyroiditis: tsh  1.858 will monitor  15. Cervical myelopathy: will monitor   16. Failure to thrive in adult: weight is 109 pounds; will continue supplements as directed        Ok Edwards NP [facility]   call [phone]

## 2022-05-13 ENCOUNTER — Non-Acute Institutional Stay (SKILLED_NURSING_FACILITY): Payer: Medicare Other | Admitting: Adult Health

## 2022-05-13 ENCOUNTER — Encounter: Payer: Self-pay | Admitting: Adult Health

## 2022-05-13 DIAGNOSIS — R279 Unspecified lack of coordination: Secondary | ICD-10-CM | POA: Diagnosis not present

## 2022-05-13 DIAGNOSIS — Z9181 History of falling: Secondary | ICD-10-CM | POA: Diagnosis not present

## 2022-05-13 DIAGNOSIS — Z0000 Encounter for general adult medical examination without abnormal findings: Secondary | ICD-10-CM | POA: Diagnosis not present

## 2022-05-13 DIAGNOSIS — M4802 Spinal stenosis, cervical region: Secondary | ICD-10-CM | POA: Diagnosis not present

## 2022-05-13 DIAGNOSIS — R262 Difficulty in walking, not elsewhere classified: Secondary | ICD-10-CM | POA: Diagnosis not present

## 2022-05-13 NOTE — Patient Instructions (Signed)
  [name] , Thank you for taking time to come for your Medicare Wellness Visit. I appreciate your ongoing commitment to your health goals. Please review the following plan we discussed and let me know if I can assist you in the future.   These are the goals we discussed:  Goals      Absence of Fall and Fall-Related Injury     Evidence-based guidance:  Assess fall risk using a validated tool when available. Consider balance and gait impairment, muscle weakness, diminished vision or hearing, environmental hazards, presence of urinary or bowel urgency and/or incontinence.  Communicate fall injury risk to interprofessional healthcare team.  Develop a fall prevention plan with the patient and family.  Promote use of personal vision and auditory aids.  Promote reorientation, appropriate sensory stimulation, and routines to decrease risk of fall when changes in mental status are present.  Assess assistance level required for safe and effective self-care; consider referral for home care.  Encourage physical activity, such as performance of self-care at highest level of ability, strength and balance exercise program, and provision of appropriate assistive devices; refer to rehabilitation therapy.  Refer to community-based fall prevention program where available.  If fall occurs, determine the cause and revise fall injury prevention plan.  Regularly review medication contribution to fall risk; consider risk related to polypharmacy and age.  Refer to pharmacist for consultation when concerns about medications are revealed.  Balance adequate pain management with potential for oversedation.  Provide guidance related to environmental modifications.  Consider supplementation with Vitamin D.   Notes:      Follow up with Primary Care Provider     General - Client will not be readmitted within 30 days ([facility])        This is a list of the screening recommended for you and due dates:  Health Maintenance   Topic Date Due   Eye exam for diabetics  Never done   Screening for Lung Cancer  02/05/2021   COVID-19 Vaccine (8 - Mixed Product risk series) 07/01/2022   Hemoglobin A1C  07/03/2022   Complete foot exam   08/01/2022   Yearly kidney health urinalysis for diabetes  01/15/2023   Yearly kidney function blood test for diabetes  02/05/2023   Medicare Annual Wellness Visit  05/14/2023   Tetanus Vaccine  05/23/2031   Pneumonia Vaccine  Completed   Flu Shot  Completed   DEXA scan (bone density measurement)  Completed   Hepatitis C Screening: USPSTF Recommendation to screen - Ages 64-79 yo.  Completed   Zoster (Shingles) Vaccine  Completed   HPV Vaccine  Aged Out   Colon Cancer Screening  Discontinued

## 2022-05-13 NOTE — Progress Notes (Signed)
Subjective:   Traci Parker is a 76 y.o. female who presents for Medicare Annual (Subsequent) preventive examination.  Review of Systems    Review of Systems  Constitutional:  Negative for malaise/fatigue.  Respiratory:  Negative for cough and shortness of breath.   Cardiovascular:  Negative for chest pain, palpitations and leg swelling.  Gastrointestinal:  Negative for abdominal pain, constipation and heartburn.  Musculoskeletal:  Negative for back pain, joint pain and myalgias.  Skin: Negative.   Neurological:  Negative for dizziness.  Psychiatric/Behavioral:  The patient is not nervous/anxious.     Cardiac Risk Factors include: advanced age (>72mn, >>86women);diabetes mellitus;sedentary lifestyle     Objective:    Today's Vitals   05/13/22 1330 05/13/22 1508  BP: (!) 142/90   Pulse: 68   Resp: 18   Temp: (!) 96.3 F (35.7 C)   SpO2: 95%   Weight: 109 lb 3.2 oz (49.5 kg)   Height: '5\' 4"'$  (1.626 m)   PainSc:  0-No pain   Body mass index is 18.74 kg/m.     05/13/2022    2:30 PM 04/14/2022    1:52 PM 03/03/2022    9:44 AM 02/20/2022    9:07 AM 02/11/2022   11:43 AM 01/16/2022    3:07 PM 12/30/2021    8:55 AM  Advanced Directives  Does Patient Have a Medical Advance Directive? Yes Yes Yes Yes Yes Yes Yes  Type of [place]amed[place]of [place] of facility DNR (pink MOST or yellow form) H[place] ParkLiving will;Out of facility DNR (pink MOST or yellow form) [place]Living will;Out of facility DNR (pink MOST or yellow form) [place] of facility DNR (pink MOST or yellow form) HTexlineLiving will HReserveOut of facility DNR (pink MOST or yellow form) [place]akehurstOut of facility DNR (pink MOST or yellow form)  Does patient want to make changes to medical advance directive? No - Patient declined No - Patient declined No - Patient declined No -  Patient declined No - Patient declined No - Patient declined No - Patient declined  Copy of [place]in Chart? Yes - validated most recent copy scanned in chart (See row information) Yes - validated most recent copy scanned in chart (See row information) Yes - validated most recent copy scanned in chart (See row information) Yes - validated most recent copy scanned in chart (See row information) No - copy requested Yes - validated most recent copy scanned in chart (See row information) Yes - validated most recent copy scanned in chart (See row information)  Would patient like information on creating a medical advance directive?     No - Patient declined    Pre-existing out of facility DNR order (yellow form or pink MOST form) Pink MOST form placed in chart (order not valid for inpatient use);Yellow form placed in chart (order not valid for inpatient use) Pink MOST/Yellow Form most recent copy in chart - Physician notified to receive inpatient order Pink MOST form placed in chart (order not valid for inpatient use);Yellow form placed in chart (order not valid for inpatient use) Yellow form placed in chart (order not valid for inpatient use)  Yellow form placed in chart (order not valid for inpatient use) Yellow form placed in chart (order not valid for inpatient use)    Current Medications (verified) Outpatient Encounter Medications as of 05/13/2022  Medication Sig   acetaminophen (TYLENOL)  325 MG tablet Take 2 tablets (650 mg total) by mouth every 6 (six) hours as needed for mild pain (or Fever >/= 101).   amLODipine (NORVASC) 5 MG tablet Take 5 mg by mouth daily.   aspirin EC 81 MG tablet Take 81 mg by mouth daily. Swallow whole.   cyanocobalamin (,VITAMIN B-12,) 1000 MCG/ML injection Inject 1 mL (1,000 mcg total) into the muscle every 30 (thirty) days.   docusate sodium (COLACE) 100 MG capsule Take 1 capsule (100 mg total) by mouth 2 (two) times daily.   dorzolamide (TRUSOPT) 2 %  ophthalmic solution Place 1 drop into both eyes 2 (two) times daily. (0900 & 1700)   ergocalciferol (VITAMIN D2) 1.25 MG (50000 UT) capsule Take 50,000 Units by mouth once a week. Thursday   folic acid (FOLVITE) 1 MG tablet Take 1 mg by mouth daily. (0900)   gabapentin (NEURONTIN) 100 MG capsule Take 100 mg by mouth 2 (two) times daily. (2100)   Iron, Ferrous Sulfate, 325 (65 Fe) MG TABS Take 1 tablet by mouth daily.   losartan (COZAAR) 25 MG tablet Take 25 mg by mouth daily.   metoCLOPramide (REGLAN) 5 MG tablet Take 1 tablet (5 mg total) by mouth 3 (three) times daily before meals.   Netarsudil-Latanoprost (ROCKLATAN) 0.02-0.005 % SOLN Apply 1 drop to eye every evening.   NON FORMULARY Diet:Regular thin liquids  ALLERGIC TO FISH   omeprazole (PRILOSEC) 40 MG capsule Take 40 mg by mouth daily at 6 (six) AM. (0600)   rosuvastatin (CRESTOR) 10 MG tablet Take 10 mg by mouth daily.   sodium chloride 1 g tablet Take 1 g by mouth 2 (two) times daily with a meal.   No facility-administered encounter medications on file as of 05/13/2022.    Allergies (verified) Latex, Levofloxacin, Neomycin-bacitracin-polymyxin  [bacitracin-neomycin-polymyxin], Other, Prednisone, Fish allergy, Shellfish allergy, and Neosporin [neomycin-polymyxin-gramicidin]   History: Past Medical History:  Diagnosis Date   Allergy    Anemia    pernicious anemia   Arthritis    Asthma    Blood transfusion without reported diagnosis    CKD (chronic kidney disease)    COPD (chronic obstructive pulmonary disease) ([place]) 12/23/2019   Diabetic peripheral neuropathy associated with type 2 diabetes mellitus ([place]) 04/07/2021   Gastritis    GERD (gastroesophageal reflux disease)    Glaucoma    History of blood in urine    Hyperlipidemia    Hypertension    Neuropathy    both feet   Ocular hypertension    Osteoporosis    Stroke ([place])    Type 2 diabetes mellitus ([place]) 12/23/2019   Vitamin B12 deficiency anemia due to intrinsic  factor deficiency    Past Surgical History:  Procedure Laterality Date   CHOLECYSTECTOMY  2008   COLONOSCOPY  04/30/2011   Procedure: COLONOSCOPY;  Surgeon: Rogene Houston, MD;  [facility];  Service: Endoscopy;  Laterality: N/A;   COLONOSCOPY  05/25/2012   Procedure: COLONOSCOPY;  Surgeon: Rogene Houston, MD;  [facility];  Service: Endoscopy;  Laterality: N/A;  1:25-changed to 1200 Ann to notify pt   COLONOSCOPY Bilateral 12/2017   COLONOSCOPY WITH PROPOFOL N/A 11/29/2020   Procedure: COLONOSCOPY WITH PROPOFOL;  Surgeon: Harvel Quale, MD;  [facility];  Service: Gastroenterology;  Laterality: N/A;  1:35 patient is in the [facility]   ESOPHAGOGASTRODUODENOSCOPY N/A 12/07/2019   Procedure: ESOPHAGOGASTRODUODENOSCOPY (EGD);  Surgeon: Rogene Houston, MD;  [facility];  Service: Endoscopy;  Laterality: N/A;  155   GALLBLADDER SURGERY     right breast cystectomy  [place]   Family History  Problem Relation Age of Onset   Diabetes Mother    Hyperlipidemia Mother    Hypertension Mother    Heart disease Father    Hyperlipidemia Father    Diabetes Sister    Hyperlipidemia Sister    Hypertension Sister    Cancer Brother    Heart disease Brother    Hyperlipidemia Brother    Hypertension Brother    Diabetes Sister    Social History   Socioeconomic History   Marital status: Divorced    Spouse name: Not on file   Number of children: 4   Years of education: 9   Highest education level: Not on file  Occupational History   Occupation: retired   Tobacco Use   Smoking status: Former    Packs/day: 1.00    Years: 40.00    Total pack years: 40.00    Types: Cigarettes    Quit date: 04/03/2011    Years since quitting: 11.1   Smokeless tobacco: Never  Vaping Use   Vaping Use: Never used  Substance and Sexual Activity   Alcohol use: No   Drug use: No   Sexual activity: Not Currently  Other Topics  Concern   Not on file  Social History Narrative   01/17[birth year] lives at [facility], [place]   Social Determinants of Health   Financial Resource Strain: Not on file  Food Insecurity: Not on file  Transportation Needs: Not on file  Physical Activity: Not on file  Stress: Not on file  Social Connections: Not on file    Tobacco Counseling Counseling given: Not Answered   Clinical Intake:  Pre-visit preparation completed: No  Pain : No/denies pain Pain Score: 0-No pain     BMI - recorded: 18.74 Nutritional Status: BMI <19  Underweight Nutritional Risks: Unintentional weight loss Diabetes: Yes CBG done?: Yes CBG resulted in Enter/ Edit results?: Yes Did pt. bring in CBG monitor from home?: No  How often do you need to have someone help you when you read instructions, pamphlets, or other written materials from your doctor or pharmacy?: 1 - Never  Diabetic?yes  Interpreter Needed?: No      Activities of Daily Living    05/13/2022    3:10 PM  In your present state of health, do you have any difficulty performing the following activities:  Hearing? 0  Vision? 0  Difficulty concentrating or making decisions? 0  Walking or climbing stairs? 1  Dressing or bathing? 1  Doing errands, shopping? 1  Preparing Food and eating ? Y  Using the Toilet? Y  In the past six months, have you accidently leaked urine? Y  Do you have problems with loss of bowel control? Y  Managing your Medications? Y  Managing your Finances? Y  Housekeeping or managing your Housekeeping? Y    Patient Care Team: Gerlene Fee, NP as PCP - General (Geriatric Medicine) Derek Jack, MD as Consulting Physician (Hematology) [facility] [place] ([place])  Indicate any recent Medical Services you may have received from other than [facility] providers in the past year (date may be approximate).     Assessment:   This is a routine wellness examination for  [place].  Hearing/Vision screen No results found.  Dietary issues and exercise activities discussed: Current Exercise Habits: The patient does not  participate in regular exercise at present, Exercise limited by: None identified   Goals Addressed             This Visit's Progress    Absence of Fall and Fall-Related Injury   On track    Evidence-based guidance:  Assess fall risk using a validated tool when available. Consider balance and gait impairment, muscle weakness, diminished vision or hearing, environmental hazards, presence of urinary or bowel urgency and/or incontinence.  Communicate fall injury risk to interprofessional healthcare team.  Develop a fall prevention plan with the patient and family.  Promote use of personal vision and auditory aids.  Promote reorientation, appropriate sensory stimulation, and routines to decrease risk of fall when changes in mental status are present.  Assess assistance level required for safe and effective self-care; consider referral for home care.  Encourage physical activity, such as performance of self-care at highest level of ability, strength and balance exercise program, and provision of appropriate assistive devices; refer to rehabilitation therapy.  Refer to community-based fall prevention program where available.  If fall occurs, determine the cause and revise fall injury prevention plan.  Regularly review medication contribution to fall risk; consider risk related to polypharmacy and age.  Refer to pharmacist for consultation when concerns about medications are revealed.  Balance adequate pain management with potential for oversedation.  Provide guidance related to environmental modifications.  Consider supplementation with Vitamin D.   Notes:      Follow up with Primary Care Provider   On track    General - Client will not be readmitted within 30 days ([facility])   On track      Depression Screen    05/13/2022    3:10 PM 10/10/2021     3:13 PM 08/04/2021    1:13 PM 05/12/2021   11:47 AM 05/10/2020    2:25 PM 12/18/2019   10:11 AM 10/30/2019    1:38 PM  PHQ 2/9 Scores  PHQ - 2 Score 0 0 0 0 0 0 0  PHQ- 9 Score 0 0 0      Exception Documentation      Medical reason     Fall Risk    05/13/2022    3:10 PM 10/10/2021    3:13 PM 08/04/2021    1:13 PM 05/12/2021   11:47 AM 07/08/2020    3:37 PM  [place] in the past year? 0 0 0 0 0  Number falls in past yr: 0 0 0 0   Injury with Fall? 0 0 0 0   Risk for fall due to : Impaired balance/gait;Impaired mobility Impaired balance/gait;Impaired mobility Impaired balance/gait;Impaired mobility      FALL RISK PREVENTION PERTAINING TO THE HOME:  Any stairs in or around the home? Yes  If so, are there any without handrails? No  Home free of loose throw rugs in walkways, pet beds, electrical cords, etc? Yes  Adequate lighting in your home to reduce risk of falls? Yes   ASSISTIVE DEVICES UTILIZED TO PREVENT FALLS:  Life alert? No  Use of a cane, walker or w/c? Yes  Grab bars in the bathroom? Yes  Shower chair or bench in shower? Yes  Elevated toilet seat or a handicapped toilet? Yes   TIMED UP AND GO:  Was the test performed? No .  Length of time to ambulate nonambulatory     Cognitive Function:    05/13/2022    3:11 PM 05/12/2021   11:48 AM  MMSE -  Mini Mental State Exam  Orientation to time 5 5  Orientation to Place 5 5  Registration 3 3  Attention/ Calculation 5 4  Recall 3 3  Language- name 2 objects 2 2  Language- repeat 1 1  Language- follow 3 step command 3 3  Language- read & follow direction 1 1  Write a sentence 1 1  Copy design 1 1  Total score 30 29        05/13/2022    3:11 PM 05/12/2021   11:48 AM 05/10/2020    2:26 PM  6CIT Screen  What Year? 0 points 0 points 0 points  What month? 0 points 0 points 0 points  What time? 0 points 0 points 0 points  Count back from 20 0 points 0 points 0 points  Months in reverse 0 points 0  points 0 points  Repeat phrase 0 points 0 points 0 points  Total Score 0 points 0 points 0 points    Immunizations Immunization History  Administered Date(s) Administered   Influenza, High Dose Seasonal PF 04/17/2019   Influenza-Unspecified 04/01/2012, 04/05/2018, 04/19/2020, 04/16/2021, 04/14/2022   Moderna Sars-Covid-2 Vaccination 05/16/2020   PFIZER(Purple Top)SARS-COV-2 Vaccination 09/25/2019, 10/16/2019, 10/23/2020   PNEUMOCOCCAL CONJUGATE-20 02/17/2022   Pfizer Covid-19 Vaccine Bivalent Booster 63yr & up 05/06/2021, 12/02/2021, 05/06/2022   Pneumococcal Conjugate-13 03/21/2014   Pneumococcal Polysaccharide-23 09/24/2017   Pneumococcal-Unspecified 12/02/2008   Tdap 05/22/2021   Zoster Recombinat (Shingrix) 03/11/2021, 05/06/2021, 06/13/2021   Zoster, Unspecified 03/11/2021    TDAP status: Up to date  Flu Vaccine status: Up to date  Pneumococcal vaccine status: Up to date  Covid-19 vaccine status: Completed vaccines  Qualifies for Shingles Vaccine? Yes   Zostavax completed Yes   Shingrix Completed?: Yes  Screening Tests Health Maintenance  Topic Date Due   OPHTHALMOLOGY EXAM  Never done   Lung Cancer Screening  02/05/2021   Medicare Annual Wellness (AWV)  05/12/2022   COVID-19 Vaccine (8 - Mixed Product risk series) 07/01/2022   HEMOGLOBIN A1C  07/03/2022   FOOT EXAM  08/01/2022   Diabetic kidney evaluation - Urine ACR  01/15/2023   Diabetic kidney evaluation - GFR measurement  02/05/2023   TETANUS/TDAP  05/23/2031   Pneumonia Vaccine 76 Years old  Completed   INFLUENZA VACCINE  Completed   DEXA SCAN  Completed   Hepatitis C Screening  Completed   Zoster Vaccines- Shingrix  Completed   HPV VACCINES  Aged Out   COLONOSCOPY (Pts [phone] coverage will need to be confirmed)  Discontinued    Health Maintenance  Health Maintenance Due  Topic Date Due   OPHTHALMOLOGY EXAM  Never done   Lung Cancer Screening  02/05/2021   Medicare Annual Wellness  (AWV)  05/12/2022    Colorectal cancer screening: Type of screening: Cologuard. Completed  . Repeat every   years will setup   Mammogram status: No longer required due to declined .  Bone Density status: Completed 02-25-22 . Results reflect: Bone density results: OSTEOPOROSIS. Repeat every   years.  Lung Cancer Screening: (Low Dose CT Chest recommended if Age 78-80ears, 30 pack-year currently smoking OR have quit w/in 15years.) does not qualify.   Lung Cancer Screening Referral: no  Additional Screening:  Hepatitis C Screening: does qualify; Completed   Vision Screening: Recommended annual ophthalmology exams for early detection of glaucoma and other disorders of the eye. Is the patient up to date with their annual eye exam?  Yes  Who is the provider or  what is the name of the office in which the patient attends annual eye exams?  If pt is not established with a provider, would they like to be referred to a provider to establish care? Yes .   Dental Screening: Recommended annual dental exams for proper oral hygiene  Community Resource Referral / Chronic Care Management: CRR required this visit?  No   CCM required this visit?  No      Plan:     I have personally reviewed and noted the following in the patient's chart:   Medical and social history Use of alcohol, tobacco or illicit drugs  Current medications and supplements including opioid prescriptions. Patient is not currently taking opioid prescriptions. Functional ability and status Nutritional status Physical activity Advanced directives List of other physicians Hospitalizations, surgeries, and ER visits in previous 12 months Vitals Screenings to include cognitive, depression, and falls Referrals and appointments  In addition, I have reviewed and discussed with patient certain preventive protocols, quality metrics, and best practice recommendations. A written personalized care plan for preventive services as well  as general preventive health recommendations were provided to patient.     Gerlene Fee, NP   05/13/2022   Nurse Notes: this exam was performed at this facility by myself

## 2022-05-15 DIAGNOSIS — R279 Unspecified lack of coordination: Secondary | ICD-10-CM | POA: Diagnosis not present

## 2022-05-15 DIAGNOSIS — M4802 Spinal stenosis, cervical region: Secondary | ICD-10-CM | POA: Diagnosis not present

## 2022-05-15 DIAGNOSIS — Z9181 History of falling: Secondary | ICD-10-CM | POA: Diagnosis not present

## 2022-05-15 DIAGNOSIS — R262 Difficulty in walking, not elsewhere classified: Secondary | ICD-10-CM | POA: Diagnosis not present

## 2022-05-16 DIAGNOSIS — R262 Difficulty in walking, not elsewhere classified: Secondary | ICD-10-CM | POA: Diagnosis not present

## 2022-05-16 DIAGNOSIS — M4802 Spinal stenosis, cervical region: Secondary | ICD-10-CM | POA: Diagnosis not present

## 2022-05-16 DIAGNOSIS — Z9181 History of falling: Secondary | ICD-10-CM | POA: Diagnosis not present

## 2022-05-16 DIAGNOSIS — R279 Unspecified lack of coordination: Secondary | ICD-10-CM | POA: Diagnosis not present

## 2022-05-18 DIAGNOSIS — R279 Unspecified lack of coordination: Secondary | ICD-10-CM | POA: Diagnosis not present

## 2022-05-18 DIAGNOSIS — M4802 Spinal stenosis, cervical region: Secondary | ICD-10-CM | POA: Diagnosis not present

## 2022-05-18 DIAGNOSIS — R262 Difficulty in walking, not elsewhere classified: Secondary | ICD-10-CM | POA: Diagnosis not present

## 2022-05-18 DIAGNOSIS — Z9181 History of falling: Secondary | ICD-10-CM | POA: Diagnosis not present

## 2022-05-19 DIAGNOSIS — R279 Unspecified lack of coordination: Secondary | ICD-10-CM | POA: Diagnosis not present

## 2022-05-19 DIAGNOSIS — R262 Difficulty in walking, not elsewhere classified: Secondary | ICD-10-CM | POA: Diagnosis not present

## 2022-05-19 DIAGNOSIS — M4802 Spinal stenosis, cervical region: Secondary | ICD-10-CM | POA: Diagnosis not present

## 2022-05-19 DIAGNOSIS — Z9181 History of falling: Secondary | ICD-10-CM | POA: Diagnosis not present

## 2022-05-20 DIAGNOSIS — Z9181 History of falling: Secondary | ICD-10-CM | POA: Diagnosis not present

## 2022-05-20 DIAGNOSIS — R262 Difficulty in walking, not elsewhere classified: Secondary | ICD-10-CM | POA: Diagnosis not present

## 2022-05-20 DIAGNOSIS — M4802 Spinal stenosis, cervical region: Secondary | ICD-10-CM | POA: Diagnosis not present

## 2022-05-20 DIAGNOSIS — R279 Unspecified lack of coordination: Secondary | ICD-10-CM | POA: Diagnosis not present

## 2022-05-21 DIAGNOSIS — R262 Difficulty in walking, not elsewhere classified: Secondary | ICD-10-CM | POA: Diagnosis not present

## 2022-05-21 DIAGNOSIS — M4802 Spinal stenosis, cervical region: Secondary | ICD-10-CM | POA: Diagnosis not present

## 2022-05-21 DIAGNOSIS — R279 Unspecified lack of coordination: Secondary | ICD-10-CM | POA: Diagnosis not present

## 2022-05-21 DIAGNOSIS — Z9181 History of falling: Secondary | ICD-10-CM | POA: Diagnosis not present

## 2022-05-22 ENCOUNTER — Encounter: Payer: Self-pay | Admitting: Adult Health

## 2022-05-22 ENCOUNTER — Non-Acute Institutional Stay (SKILLED_NURSING_FACILITY): Payer: Medicare Other | Admitting: Adult Health

## 2022-05-22 DIAGNOSIS — R262 Difficulty in walking, not elsewhere classified: Secondary | ICD-10-CM | POA: Diagnosis not present

## 2022-05-22 DIAGNOSIS — N183 Chronic kidney disease, stage 3 unspecified: Secondary | ICD-10-CM | POA: Diagnosis not present

## 2022-05-22 DIAGNOSIS — Z9181 History of falling: Secondary | ICD-10-CM | POA: Diagnosis not present

## 2022-05-22 DIAGNOSIS — J449 Chronic obstructive pulmonary disease, unspecified: Secondary | ICD-10-CM

## 2022-05-22 DIAGNOSIS — I700 Atherosclerosis of aorta: Secondary | ICD-10-CM

## 2022-05-22 DIAGNOSIS — E1122 Type 2 diabetes mellitus with diabetic chronic kidney disease: Secondary | ICD-10-CM | POA: Diagnosis not present

## 2022-05-22 DIAGNOSIS — M4802 Spinal stenosis, cervical region: Secondary | ICD-10-CM | POA: Diagnosis not present

## 2022-05-22 DIAGNOSIS — R279 Unspecified lack of coordination: Secondary | ICD-10-CM | POA: Diagnosis not present

## 2022-05-22 NOTE — Progress Notes (Signed)
Location:  [place] Room Number: 150 Place of Service:  SNF (31)   CODE STATUS: dnr   Allergies  Allergen Reactions   Latex Rash   Levofloxacin Nausea And Vomiting   Neomycin-Bacitracin-Polymyxin  [Bacitracin-Neomycin-Polymyxin] Rash   Other Swelling and Rash    TOMATO  SOUP-PER MAR   Prednisone Hives, Itching, Rash and Other (See Comments)   Fish Allergy     rash   Shellfish Allergy Hives   Neosporin [Neomycin-Polymyxin-Gramicidin] Itching and Rash    Chief Complaint  Patient presents with   Acute Visit    Care plan meeting     HPI:  We have come together for her care plan meeting.  BIMS 15/15 mood 0/30. Nonambulatory with no falls. She requires dependent care for her adls. She is frequently incontinent of bladder and bowel. Dietary:  weight 108 pounds regular diet appetite 50-100% feeds self. Therapy: bed mobility mod I; transfers mod A; ambulate 130 feet min assist . Activities: is active . We will continue follow her chronic illnesses including:   Aortic atherosclerosis  Chronic obstructive pulmonary disease unspecified COPD type  CKD stage 3 due to type 2 diabetes mellitus   Past Medical History:  Diagnosis Date   Allergy    Anemia    pernicious anemia   Arthritis    Asthma    Blood transfusion without reported diagnosis    CKD (chronic kidney disease)    COPD (chronic obstructive pulmonary disease) ([place]) 12/23/2019   Diabetic peripheral neuropathy associated with type 2 diabetes mellitus ([place]) 04/07/2021   Gastritis    GERD (gastroesophageal reflux disease)    Glaucoma    History of blood in urine    Hyperlipidemia    Hypertension    Neuropathy    both feet   Ocular hypertension    Osteoporosis    Stroke ([place])    Type 2 diabetes mellitus ([place]) 12/23/2019   Vitamin B12 deficiency anemia due to intrinsic factor deficiency     Past Surgical History:  Procedure Laterality Date   CHOLECYSTECTOMY  2008   COLONOSCOPY  04/30/2011    Procedure: COLONOSCOPY;  Surgeon: Rogene Houston, MD;  [facility];  Service: Endoscopy;  Laterality: N/A;   COLONOSCOPY  05/25/2012   Procedure: COLONOSCOPY;  Surgeon: Rogene Houston, MD;  [facility];  Service: Endoscopy;  Laterality: N/A;  1:25-changed to 1200 Ann to notify pt   COLONOSCOPY Bilateral 12/2017   COLONOSCOPY WITH PROPOFOL N/A 11/29/2020   Procedure: COLONOSCOPY WITH PROPOFOL;  Surgeon: Harvel Quale, MD;  [facility];  Service: Gastroenterology;  Laterality: N/A;  1:35 patient is in the [facility]   ESOPHAGOGASTRODUODENOSCOPY N/A 12/07/2019   Procedure: ESOPHAGOGASTRODUODENOSCOPY (EGD);  Surgeon: Rogene Houston, MD;  [facility];  Service: Endoscopy;  Laterality: N/A;  155   GALLBLADDER SURGERY     right breast cystectomy  1988   TUBAL LIGATION  1975    Social History   Socioeconomic History   Marital status: Divorced    Spouse name: Not on file   Number of children: 4   Years of education: 9   Highest education level: Not on file  Occupational History   Occupation: retired   Tobacco Use   Smoking status: Former    Packs/day: 1.00    Years: 40.00    Total pack years: 40.00    Types: Cigarettes    Quit date: 04/03/2011    Years  since quitting: 11.1   Smokeless tobacco: Never  Vaping Use   Vaping Use: Never used  Substance and Sexual Activity   Alcohol use: No   Drug use: No   Sexual activity: Not Currently  Other Topics Concern   Not on file  Social History Narrative   01/17/20 lives at [facility], [place]   Social Determinants of Health   Financial Resource Strain: Not on file  Food Insecurity: Not on file  Transportation Needs: Not on file  Physical Activity: Not on file  Stress: Not on file  Social Connections: Not on file  Intimate Partner Violence: Not on file   Family History  Problem Relation Age of Onset   Diabetes Mother    Hyperlipidemia Mother    Hypertension Mother     Heart disease Father    Hyperlipidemia Father    Diabetes Sister    Hyperlipidemia Sister    Hypertension Sister    Cancer Brother    Heart disease Brother    Hyperlipidemia Brother    Hypertension Brother    Diabetes Sister       VITAL SIGNS BP 127/69   Pulse 63   Temp 98 F (36.7 C)   Resp 16   Ht '5\' 4"'$  (1.626 m)   Wt 108 lb 6.4 oz (49.2 kg)   SpO2 98%   BMI 18.61 kg/m   Outpatient Encounter Medications as of 05/22/2022  Medication Sig   acetaminophen (TYLENOL) 325 MG tablet Take 2 tablets (650 mg total) by mouth every 6 (six) hours as needed for mild pain (or Fever >/= 101).   amLODipine (NORVASC) 5 MG tablet Take 5 mg by mouth daily.   aspirin EC 81 MG tablet Take 81 mg by mouth daily. Swallow whole.   cyanocobalamin (,VITAMIN B-12,) 1000 MCG/ML injection Inject 1 mL (1,000 mcg total) into the muscle every 30 (thirty) days.   docusate sodium (COLACE) 100 MG capsule Take 1 capsule (100 mg total) by mouth 2 (two) times daily.   dorzolamide (TRUSOPT) 2 % ophthalmic solution Place 1 drop into both eyes 2 (two) times daily. (0900 & 1700)   ergocalciferol (VITAMIN D2) 1.25 MG (50000 UT) capsule Take 50,000 Units by mouth once a week. Thursday   folic acid (FOLVITE) 1 MG tablet Take 1 mg by mouth daily. (0900)   gabapentin (NEURONTIN) 100 MG capsule Take 100 mg by mouth 2 (two) times daily. (2100)   Iron, Ferrous Sulfate, 325 (65 Fe) MG TABS Take 1 tablet by mouth daily.   losartan (COZAAR) 25 MG tablet Take 25 mg by mouth daily.   metoCLOPramide (REGLAN) 5 MG tablet Take 1 tablet (5 mg total) by mouth 3 (three) times daily before meals.   Netarsudil-Latanoprost (ROCKLATAN) 0.02-0.005 % SOLN Apply 1 drop to eye every evening.   NON FORMULARY Diet:Regular thin liquids  ALLERGIC TO FISH   omeprazole (PRILOSEC) 40 MG capsule Take 40 mg by mouth daily at 6 (six) AM. (0600)   rosuvastatin (CRESTOR) 10 MG tablet Take 10 mg by mouth daily.   sodium chloride 1 g tablet Take 1 g  by mouth 2 (two) times daily with a meal.   No facility-administered encounter medications on file as of 05/22/2022.     SIGNIFICANT DIAGNOSTIC EXAMS  PREVIOUS  05-24-20: dexa: t score -2.387  NO NEW EXAMS.     LABS REVIEWED PREVIOUS    05-20-21: wbc 8.4; hgb 12.2; hct 40.3; mcv 95.3 plt 176; vit B 12: 777 iron 56;  tibc 215; ferritin 284 folate>100.0  06-25-21: glucose 82; bun 31; creat 1.26; k+ 4.6; na++ 138; ca 9.1; GFR 44  08-31-21: wbc 6.5; hgb 10.4; hct 32.5; mcv 93.9 plt 181; glucose 118; bun 24; creat 1.59; k+ 4.2; na++ 133; ca 9.0; gfr 38 phos 4.7; albumin 3.9  09-22-21: wbc 6.3; hgb 9.2; hct 29.4; mcv 94.8 plt 182; glucose 91; bun 32; creat 1.43; k 4.1; na++ 133; ca 9.0; GFR 38; phos 4.7; albumin 3.9 10-13-21: urine micro-albumin 12.8 11-10-21: chol 149; ldl 83; trig 80 hdl 50 01-01-22: hgb a1c 5.4 01-14-22: wbc 5.8; hgb 10.2; hct 30.8; mc 89.8 plt 1409; glucose 101; bun 17; creat 1.14; k+ 4.4; na++ 128; ca 8.8; gfr 50; phos 3.5 albumin 3.7  01-22-22: glucose 78; bun 16; creat 1.13; k+ 4.5; na++ 134; ca 9.0; gfr 50  02-04-22: wbc 8.4; hgb 11.5; hct 36.1; mcv 93.p plt 211; glucose 121; bun 17; creat 1.21; k+ 4.7; na++ 134; ca 9.5; gfr 46; protein 8.3; albumin 4.4; vitamin B 12; 913; folate >40; iron 28; tibc 210  02-05-22: tsh 1.858; chol 117; ldl 63 trig 38 hdl 46   NO NEW LABS.  Review of Systems  Constitutional:  Negative for malaise/fatigue.  Respiratory:  Negative for cough and shortness of breath.   Cardiovascular:  Negative for chest pain, palpitations and leg swelling.  Gastrointestinal:  Negative for abdominal pain, constipation and heartburn.  Musculoskeletal:  Negative for back pain, joint pain and myalgias.  Skin: Negative.   Neurological:  Negative for dizziness.  Psychiatric/Behavioral:  The patient is not nervous/anxious.     Physical Exam Constitutional:      General: She is not in acute distress.    Appearance: She is well-developed. She is not diaphoretic.   Neck:     Thyroid: No thyromegaly.  Cardiovascular:     Rate and Rhythm: Normal rate and regular rhythm.     Heart sounds: Normal heart sounds.  Pulmonary:     Effort: Pulmonary effort is normal. No respiratory distress.     Breath sounds: Normal breath sounds.  Abdominal:     General: Bowel sounds are normal. There is no distension.     Palpations: Abdomen is soft.     Tenderness: There is no abdominal tenderness.  Musculoskeletal:        General: Normal range of motion.     Cervical back: Neck supple.     Right lower leg: No edema.     Left lower leg: No edema.  Lymphadenopathy:     Cervical: No cervical adenopathy.  Skin:    General: Skin is warm and dry.  Neurological:     Mental Status: She is alert and oriented to person, place, and time.       ASSESSMENT/ PLAN:  TODAY  Aortic atherosclerosis Chronic obstructive pulmonary disease unspecified COPD type CKD stage 3 due to type 2 diabetes mellitus    Will continue current medications Will continue current plan of care Will continue to monitor her status.   Time spent with patient: 40 minutes: medications; plan of care dietary    Ok Edwards NP [facility] Adult Medicine   call [phone]

## 2022-05-25 DIAGNOSIS — R279 Unspecified lack of coordination: Secondary | ICD-10-CM | POA: Diagnosis not present

## 2022-05-25 DIAGNOSIS — R262 Difficulty in walking, not elsewhere classified: Secondary | ICD-10-CM | POA: Diagnosis not present

## 2022-05-25 DIAGNOSIS — M4802 Spinal stenosis, cervical region: Secondary | ICD-10-CM | POA: Diagnosis not present

## 2022-05-25 DIAGNOSIS — Z9181 History of falling: Secondary | ICD-10-CM | POA: Diagnosis not present

## 2022-05-26 DIAGNOSIS — R279 Unspecified lack of coordination: Secondary | ICD-10-CM | POA: Diagnosis not present

## 2022-05-26 DIAGNOSIS — Z9181 History of falling: Secondary | ICD-10-CM | POA: Diagnosis not present

## 2022-05-26 DIAGNOSIS — M4802 Spinal stenosis, cervical region: Secondary | ICD-10-CM | POA: Diagnosis not present

## 2022-05-26 DIAGNOSIS — R262 Difficulty in walking, not elsewhere classified: Secondary | ICD-10-CM | POA: Diagnosis not present

## 2022-05-27 DIAGNOSIS — R262 Difficulty in walking, not elsewhere classified: Secondary | ICD-10-CM | POA: Diagnosis not present

## 2022-05-27 DIAGNOSIS — R279 Unspecified lack of coordination: Secondary | ICD-10-CM | POA: Diagnosis not present

## 2022-05-27 DIAGNOSIS — M4802 Spinal stenosis, cervical region: Secondary | ICD-10-CM | POA: Diagnosis not present

## 2022-05-27 DIAGNOSIS — Z9181 History of falling: Secondary | ICD-10-CM | POA: Diagnosis not present

## 2022-05-28 DIAGNOSIS — R262 Difficulty in walking, not elsewhere classified: Secondary | ICD-10-CM | POA: Diagnosis not present

## 2022-05-28 DIAGNOSIS — R279 Unspecified lack of coordination: Secondary | ICD-10-CM | POA: Diagnosis not present

## 2022-05-28 DIAGNOSIS — Z9181 History of falling: Secondary | ICD-10-CM | POA: Diagnosis not present

## 2022-05-28 DIAGNOSIS — M4802 Spinal stenosis, cervical region: Secondary | ICD-10-CM | POA: Diagnosis not present

## 2022-06-04 ENCOUNTER — Other Ambulatory Visit (HOSPITAL_COMMUNITY)
Admission: RE | Admit: 2022-06-04 | Discharge: 2022-06-04 | Disposition: A | Discharge status: Home / Self Care | Payer: Medicare Other | Source: Skilled Nursing Facility | Attending: Internal Medicine | Admitting: Internal Medicine

## 2022-06-04 DIAGNOSIS — N189 Chronic kidney disease, unspecified: Secondary | ICD-10-CM | POA: Insufficient documentation

## 2022-06-04 DIAGNOSIS — I1310 Hypertensive heart and chronic kidney disease without heart failure, with stage 1 through stage 4 chronic kidney disease, or unspecified chronic kidney disease: Secondary | ICD-10-CM | POA: Diagnosis not present

## 2022-06-04 DIAGNOSIS — E538 Deficiency of other specified B group vitamins: Secondary | ICD-10-CM | POA: Insufficient documentation

## 2022-06-04 DIAGNOSIS — D631 Anemia in chronic kidney disease: Secondary | ICD-10-CM | POA: Insufficient documentation

## 2022-06-04 LAB — CBC
HCT: 31.6 % — ABNORMAL LOW (ref 36.0–46.0)
Hemoglobin: 9.9 g/dL — ABNORMAL LOW (ref 12.0–15.0)
MCH: 28.9 pg (ref 26.0–34.0)
MCHC: 31.3 g/dL (ref 30.0–36.0)
MCV: 92.4 fL (ref 80.0–100.0)
Platelets: 161 10*3/uL (ref 150–400)
RBC: 3.42 MIL/uL — ABNORMAL LOW (ref 3.87–5.11)
RDW: 14.5 % (ref 11.5–15.5)
WBC: 5.5 10*3/uL (ref 4.0–10.5)
nRBC: 0 % (ref 0.0–0.2)

## 2022-06-04 LAB — RENAL FUNCTION PANEL
Albumin: 3.6 g/dL (ref 3.5–5.0)
Anion gap: 5 (ref 5–15)
BUN: 19 mg/dL (ref 8–23)
CO2: 27 mmol/L (ref 22–32)
Calcium: 9 mg/dL (ref 8.9–10.3)
Chloride: 104 mmol/L (ref 98–111)
Creatinine, Ser: 1.32 mg/dL — ABNORMAL HIGH (ref 0.44–1.00)
GFR, Estimated: 42 mL/min — ABNORMAL LOW (ref >60)
Glucose, Bld: 84 mg/dL (ref 70–99)
Phosphorus: 4.2 mg/dL (ref 2.5–4.6)
Potassium: 4.5 mmol/L (ref 3.5–5.1)
Sodium: 136 mmol/L (ref 135–145)

## 2022-06-04 LAB — PROTEIN / CREATININE RATIO, URINE
Creatinine, Urine: 69.55 mg/dL
Protein Creatinine Ratio: 0.19 mg/mg{Cre} — ABNORMAL HIGH (ref 0.00–0.15)
Total Protein, Urine: 13 mg/dL

## 2022-06-04 LAB — IRON AND TIBC
Iron: 33 ug/dL (ref 28–170)
Saturation Ratios: 20 % (ref 10.4–31.8)
TIBC: 162 ug/dL — ABNORMAL LOW (ref 250–450)
UIBC: 129 ug/dL

## 2022-06-04 LAB — FERRITIN: Ferritin: 392 ng/mL — ABNORMAL HIGH (ref 11–307)

## 2022-06-09 ENCOUNTER — Non-Acute Institutional Stay (SKILLED_NURSING_FACILITY): Payer: Medicare Other | Admitting: Adult Health

## 2022-06-09 ENCOUNTER — Encounter: Payer: Self-pay | Admitting: Adult Health

## 2022-06-09 DIAGNOSIS — J449 Chronic obstructive pulmonary disease, unspecified: Secondary | ICD-10-CM

## 2022-06-09 DIAGNOSIS — N183 Chronic kidney disease, stage 3 unspecified: Secondary | ICD-10-CM

## 2022-06-09 DIAGNOSIS — I700 Atherosclerosis of aorta: Secondary | ICD-10-CM | POA: Diagnosis not present

## 2022-06-09 DIAGNOSIS — Z86718 Personal history of other venous thrombosis and embolism: Secondary | ICD-10-CM

## 2022-06-09 DIAGNOSIS — E1122 Type 2 diabetes mellitus with diabetic chronic kidney disease: Secondary | ICD-10-CM | POA: Diagnosis not present

## 2022-06-09 NOTE — Progress Notes (Signed)
Location:  [place] Room Number: 150 Place of Service:  SNF (31)   CODE STATUS: dnr   Allergies  Allergen Reactions   Latex Rash   Levofloxacin Nausea And Vomiting   Neomycin-Bacitracin-Polymyxin  [Bacitracin-Neomycin-Polymyxin] Rash   Other Swelling and Rash    TOMATO  SOUP-PER MAR   Prednisone Hives, Itching, Rash and Other (See Comments)   Fish Allergy     rash   Shellfish Allergy Hives   Neosporin [Neomycin-Polymyxin-Gramicidin] Itching and Rash    Chief Complaint  Patient presents with   Medical Management of Chronic Issues                    Aortic atherosclerosis  History of dvt in left lower extremity: Chronic obstructive pulmonary disease unspecified COPD type: CKD stage 3 due to type 2 diabetes mellitus    HPI:  She is a 76 year old long term resident of this facility being seen for the management of her chronic illnesses:  Aortic atherosclerosis  History of dvt in left lower extremity: Chronic obstructive pulmonary disease unspecified COPD type: CKD stage 3 due to type 2 diabetes mellitus. She does get out of bed daily. She wears gloves most of the time due to neuropathy. There are no reports of uncontrolled pain; no changes in appetite. No cough or shortness of breath   Past Medical History:  Diagnosis Date   Allergy    Anemia    pernicious anemia   Arthritis    Asthma    Blood transfusion without reported diagnosis    CKD (chronic kidney disease)    COPD (chronic obstructive pulmonary disease) ([place]) 12/23/2019   Diabetic peripheral neuropathy associated with type 2 diabetes mellitus ([place]) 04/07/2021   Gastritis    GERD (gastroesophageal reflux disease)    Glaucoma    History of blood in urine    Hyperlipidemia    Hypertension    Neuropathy    both feet   Ocular hypertension    Osteoporosis    Stroke ([place])    Type 2 diabetes mellitus ([place]) 12/23/2019   Vitamin B12 deficiency anemia due to intrinsic factor deficiency      Past Surgical History:  Procedure Laterality Date   CHOLECYSTECTOMY  2008   COLONOSCOPY  04/30/2011   Procedure: COLONOSCOPY;  Surgeon: Rogene Houston, MD;  [facility];  Service: Endoscopy;  Laterality: N/A;   COLONOSCOPY  05/25/2012   Procedure: COLONOSCOPY;  Surgeon: Rogene Houston, MD;  [facility];  Service: Endoscopy;  Laterality: N/A;  1:25-changed to 1200 Ann to notify pt   COLONOSCOPY Bilateral 12/2017   COLONOSCOPY WITH PROPOFOL N/A 11/29/2020   Procedure: COLONOSCOPY WITH PROPOFOL;  Surgeon: Harvel Quale, MD;  [facility];  Service: Gastroenterology;  Laterality: N/A;  1:35 patient is in the [facility]   ESOPHAGOGASTRODUODENOSCOPY N/A 12/07/2019   Procedure: ESOPHAGOGASTRODUODENOSCOPY (EGD);  Surgeon: Rogene Houston, MD;  [facility];  Service: Endoscopy;  Laterality: N/A;  155   GALLBLADDER SURGERY     right breast cystectomy  1988   TUBAL LIGATION  1975    Social History   Socioeconomic History   Marital status: Divorced    Spouse name: Not on file   Number of children: 4   Years of education: 9   Highest education level: Not on file  Occupational History   Occupation: retired   Tobacco Use   Smoking status: Former  Packs/day: 1.00    Years: 40.00    Total pack years: 40.00    Types: Cigarettes    Quit date: 04/03/2011    Years since quitting: 11.1   Smokeless tobacco: Never  Vaping Use   Vaping Use: Never used  Substance and Sexual Activity   Alcohol use: No   Drug use: No   Sexual activity: Not Currently  Other Topics Concern   Not on file  Social History Narrative   01/17/20 lives at [facility], [place]   Social Determinants of Health   Financial Resource Strain: Not on file  Food Insecurity: Not on file  Transportation Needs: Not on file  Physical Activity: Not on file  Stress: Not on file  Social Connections: Not on file  Intimate Partner Violence: Not on file    Family History  Problem Relation Age of Onset   Diabetes Mother    Hyperlipidemia Mother    Hypertension Mother    Heart disease Father    Hyperlipidemia Father    Diabetes Sister    Hyperlipidemia Sister    Hypertension Sister    Cancer Brother    Heart disease Brother    Hyperlipidemia Brother    Hypertension Brother    Diabetes Sister       VITAL SIGNS BP 124/80   Pulse 70   Temp 98 F (36.7 C)   Resp 18   Ht '5\' 4"'$  (1.626 m)   Wt 108 lb 6.4 oz (49.2 kg)   SpO2 96%   BMI 18.61 kg/m   Outpatient Encounter Medications as of 06/09/2022  Medication Sig   acetaminophen (TYLENOL) 325 MG tablet Take 2 tablets (650 mg total) by mouth every 6 (six) hours as needed for mild pain (or Fever >/= 101).   amLODipine (NORVASC) 5 MG tablet Take 5 mg by mouth daily.   aspirin EC 81 MG tablet Take 81 mg by mouth daily. Swallow whole.   cyanocobalamin (,VITAMIN B-12,) 1000 MCG/ML injection Inject 1 mL (1,000 mcg total) into the muscle every 30 (thirty) days.   docusate sodium (COLACE) 100 MG capsule Take 1 capsule (100 mg total) by mouth 2 (two) times daily.   dorzolamide (TRUSOPT) 2 % ophthalmic solution Place 1 drop into both eyes 2 (two) times daily. (0900 & 1700)   ergocalciferol (VITAMIN D2) 1.25 MG (50000 UT) capsule Take 50,000 Units by mouth once a week. Thursday   folic acid (FOLVITE) 1 MG tablet Take 1 mg by mouth daily. (0900)   gabapentin (NEURONTIN) 100 MG capsule Take 100 mg by mouth 2 (two) times daily. (2100)   Iron, Ferrous Sulfate, 325 (65 Fe) MG TABS Take 1 tablet by mouth daily.   losartan (COZAAR) 25 MG tablet Take 25 mg by mouth daily.   metoCLOPramide (REGLAN) 5 MG tablet Take 1 tablet (5 mg total) by mouth 3 (three) times daily before meals.   Netarsudil-Latanoprost (ROCKLATAN) 0.02-0.005 % SOLN Apply 1 drop to eye every evening.   NON FORMULARY Diet:Regular thin liquids  ALLERGIC TO FISH   omeprazole (PRILOSEC) 40 MG capsule Take 40 mg by mouth daily at 6  (six) AM. (0600)   rosuvastatin (CRESTOR) 10 MG tablet Take 10 mg by mouth daily.   sodium chloride 1 g tablet Take 1 g by mouth 2 (two) times daily with a meal.   No facility-administered encounter medications on file as of 06/09/2022.     SIGNIFICANT DIAGNOSTIC EXAMS   PREVIOUS  05-24-20: dexa: t score -2.387  NO NEW EXAMS.     LABS REVIEWED PREVIOUS    05-20-21: wbc 8.4; hgb 12.2; hct 40.3; mcv 95.3 plt 176; vit B 12: 777 iron 56; tibc 215; ferritin 284 folate>100.0  06-25-21: glucose 82; bun 31; creat 1.26; k+ 4.6; na++ 138; ca 9.1; GFR 44  08-31-21: wbc 6.5; hgb 10.4; hct 32.5; mcv 93.9 plt 181; glucose 118; bun 24; creat 1.59; k+ 4.2; na++ 133; ca 9.0; gfr 38 phos 4.7; albumin 3.9  09-22-21: wbc 6.3; hgb 9.2; hct 29.4; mcv 94.8 plt 182; glucose 91; bun 32; creat 1.43; k 4.1; na++ 133; ca 9.0; GFR 38; phos 4.7; albumin 3.9 10-13-21: urine micro-albumin 12.8 11-10-21: chol 149; ldl 83; trig 80 hdl 50 01-01-22: hgb a1c 5.4 01-14-22: wbc 5.8; hgb 10.2; hct 30.8; mc 89.8 plt 1409; glucose 101; bun 17; creat 1.14; k+ 4.4; na++ 128; ca 8.8; gfr 50; phos 3.5 albumin 3.7  01-22-22: glucose 78; bun 16; creat 1.13; k+ 4.5; na++ 134; ca 9.0; gfr 50  02-04-22: wbc 8.4; hgb 11.5; hct 36.1; mcv 93.p plt 211; glucose 121; bun 17; creat 1.21; k+ 4.7; na++ 134; ca 9.5; gfr 46; protein 8.3; albumin 4.4; vitamin B 12; 913; folate >40; iron 28; tibc 210  02-05-22: tsh 1.858; chol 117; ldl 63 trig 38 hdl 46   TODAY  06-04-22: wbc 5.5; hgb 9.9; hct 31.6; mcv 92.4 plt 161; glucose 84; bun 19; creat 1.32; k+ 4.5; na++ 136; ca 9.0; gfr 42; phos 4.2; albumin 3.6; iron 33; tibc 162; ferritin 392; urine ACR: 0.19  Review of Systems  Constitutional:  Negative for malaise/fatigue.  Respiratory:  Negative for cough and shortness of breath.   Cardiovascular:  Negative for chest pain, palpitations and leg swelling.  Gastrointestinal:  Negative for abdominal pain, constipation and heartburn.  Musculoskeletal:   Negative for back pain, joint pain and myalgias.  Skin: Negative.   Neurological:  Negative for dizziness.  Psychiatric/Behavioral:  The patient is not nervous/anxious.    Physical Exam Constitutional:      General: She is not in acute distress.    Appearance: She is well-developed. She is not diaphoretic.  Neck:     Thyroid: No thyromegaly.  Cardiovascular:     Rate and Rhythm: Normal rate and regular rhythm.     Pulses: Normal pulses.     Heart sounds: Normal heart sounds.  Pulmonary:     Effort: Pulmonary effort is normal. No respiratory distress.     Breath sounds: Normal breath sounds.  Abdominal:     General: Bowel sounds are normal. There is no distension.     Palpations: Abdomen is soft.     Tenderness: There is no abdominal tenderness.  Musculoskeletal:        General: Normal range of motion.     Cervical back: Neck supple.     Right lower leg: No edema.     Left lower leg: No edema.  Lymphadenopathy:     Cervical: No cervical adenopathy.  Skin:    General: Skin is warm and dry.  Neurological:     Mental Status: She is alert and oriented to person, place, and time.  Psychiatric:        Mood and Affect: Mood normal.      ASSESSMENT/ PLAN:  TODAY    Aortic atherosclerosis (ct 12-23-19) will monitor   2. 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 Chronic  obstructive pulmonary disease unspecified COPD type: will monitor   4. CKD stage 3 due to type 2 diabetes mellitus: bun 19; creat 1.36; gfr 42   PREVIOUS   5. Diabetic peripheral neuropathy associated with type 2 diabetes mellitus: will continue gabapentin twice daily   6. Other chronic gastritis without hemorrhage: will continue prilosec 40 mg daily; reglan 5 mg prior to meals  7. Type 2 diabetes mellitus with stage 3 chronic kidney disease without long term current use of insulin: hgb a1c 5.3; asa 81 mg daily is on statin and arb  8.  Hyperlipidemia associated with type 2 diabetes mellitus: ldl 63 will continue crestor 10 mg daily   9. Hypertension associated with stage 3 chronic kidney disease due to type 2 diabetes mellitus: b/p 114/64 will continue norvasc 5 mg daily  cozaar 25 mg daily   10. Anemia due to chronic renal failure treated with erythropoietin stage 3: hgb 11.5 will continue iron daily; is seen by oncology for injections.  11. Increased intraocular pressure bilateral: will continue trusopt to both eyes twice daily and rocklatan to both eyes nightly   12. Vitamin B 12 deficiency: stable level 913; folate >40; will continue monthly injections and folic acid 1 mg daily   13. Vitamin D deficiency: level 41.14 will continue 50,000 units weekly   14. Thyroiditis: tsh  1.858 will monitor  15. Cervical myelopathy: will monitor   16. Failure to thrive in adult: weight is 109 pounds; will continue supplements as directed       Ok Edwards NP [facility] Adult Medicine

## 2022-07-10 ENCOUNTER — Encounter: Payer: Self-pay | Admitting: Adult Health

## 2022-07-10 ENCOUNTER — Non-Acute Institutional Stay (SKILLED_NURSING_FACILITY): Payer: Medicare Other | Admitting: Adult Health

## 2022-07-10 DIAGNOSIS — E785 Hyperlipidemia, unspecified: Secondary | ICD-10-CM | POA: Diagnosis not present

## 2022-07-10 DIAGNOSIS — I129 Hypertensive chronic kidney disease with stage 1 through stage 4 chronic kidney disease, or unspecified chronic kidney disease: Secondary | ICD-10-CM

## 2022-07-10 DIAGNOSIS — E1169 Type 2 diabetes mellitus with other specified complication: Secondary | ICD-10-CM

## 2022-07-10 DIAGNOSIS — E1129 Type 2 diabetes mellitus with other diabetic kidney complication: Secondary | ICD-10-CM | POA: Diagnosis not present

## 2022-07-10 DIAGNOSIS — K2950 Unspecified chronic gastritis without bleeding: Secondary | ICD-10-CM

## 2022-07-10 DIAGNOSIS — E1142 Type 2 diabetes mellitus with diabetic polyneuropathy: Secondary | ICD-10-CM

## 2022-07-10 DIAGNOSIS — N183 Chronic kidney disease, stage 3 unspecified: Secondary | ICD-10-CM

## 2022-07-10 DIAGNOSIS — D638 Anemia in other chronic diseases classified elsewhere: Secondary | ICD-10-CM | POA: Diagnosis not present

## 2022-07-10 DIAGNOSIS — E1122 Type 2 diabetes mellitus with diabetic chronic kidney disease: Secondary | ICD-10-CM | POA: Diagnosis not present

## 2022-07-10 DIAGNOSIS — N189 Chronic kidney disease, unspecified: Secondary | ICD-10-CM | POA: Diagnosis not present

## 2022-07-10 DIAGNOSIS — R809 Proteinuria, unspecified: Secondary | ICD-10-CM | POA: Diagnosis not present

## 2022-07-10 NOTE — Progress Notes (Signed)
Location:  [place] Room Number: 150-P Place of Service:  SNF (31)   CODE STATUS: DNR  Allergies  Allergen Reactions   Latex Rash   Levofloxacin Nausea And Vomiting   Neomycin-Bacitracin-Polymyxin  [Bacitracin-Neomycin-Polymyxin] Rash   Other Swelling and Rash    TOMATO  SOUP-PER MAR   Prednisone Hives, Itching, Rash and Other (See Comments)   Fish Allergy     rash   Shellfish Allergy Hives   Neosporin [Neomycin-Polymyxin-Gramicidin] Itching and Rash    Chief Complaint  Patient presents with   Medical Management of Chronic Issues                      Diabetic peripheral neuropathy associated with type 2 diabetes mellitus: Other chronic gastritis without hemorrhage:  Type 2 diabetes mellitus with stage 3 chronic kidney disease without long term current use of insulin: Hyperlipidemia associated with type 2 diabetes mellitus    HPI:  She is a 76 year old long term resident of this facility being seen for the management of her chronic illnesses:   Diabetic peripheral neuropathy associated with type 2 diabetes mellitus: Other chronic gastritis without hemorrhage:  Type 2 diabetes mellitus with stage 3 chronic kidney disease without long term current use of insulin.Hyperlipidemia associated with type 2 diabetes mellitus There are no reports of uncontrolled pain. She continues to wear gloves daily for warmth. There are no reports of anxiety or depressive thoughts.   Past Medical History:  Diagnosis Date   Allergy    Anemia    pernicious anemia   Arthritis    Asthma    Blood transfusion without reported diagnosis    CKD (chronic kidney disease)    COPD (chronic obstructive pulmonary disease) ([place]) 12/23/2019   Diabetic peripheral neuropathy associated with type 2 diabetes mellitus ([place]) 04/07/2021   Gastritis    GERD (gastroesophageal reflux disease)    Glaucoma    History of blood in urine    Hyperlipidemia    Hypertension    Neuropathy    both feet    Ocular hypertension    Osteoporosis    Stroke ([place])    Type 2 diabetes mellitus ([place]) 12/23/2019   Vitamin B12 deficiency anemia due to intrinsic factor deficiency     Past Surgical History:  Procedure Laterality Date   CHOLECYSTECTOMY  2008   COLONOSCOPY  04/30/2011   Procedure: COLONOSCOPY;  Surgeon: Rogene Houston, MD;  [facility];  Service: Endoscopy;  Laterality: N/A;   COLONOSCOPY  05/25/2012   Procedure: COLONOSCOPY;  Surgeon: Rogene Houston, MD;  [facility];  Service: Endoscopy;  Laterality: N/A;  1:25-changed to 1200 Ann to notify pt   COLONOSCOPY Bilateral 12/2017   COLONOSCOPY WITH PROPOFOL N/A 11/29/2020   Procedure: COLONOSCOPY WITH PROPOFOL;  Surgeon: Harvel Quale, MD;  Location: [facility];  Service: Gastroenterology;  Laterality: N/A;  1:35 patient is in the [facility]   ESOPHAGOGASTRODUODENOSCOPY N/A 12/07/2019   Procedure: ESOPHAGOGASTRODUODENOSCOPY (EGD);  Surgeon: Rogene Houston, MD;  [facility];  Service: Endoscopy;  Laterality: N/A;  155   GALLBLADDER SURGERY     right breast cystectomy  1988   TUBAL LIGATION  1975    Social History   Socioeconomic History   Marital status: Divorced    Spouse name: Not on file   Number of children: 4   Years of education: 9   Highest education level: Not on file  Occupational History   Occupation: retired   Tobacco Use   Smoking status: Former    Packs/day: 1.00    Years: 40.00    Total pack years: 40.00    Types: Cigarettes    Quit date: 04/03/2011    Years since quitting: 11.2   Smokeless tobacco: Never  Vaping Use   Vaping Use: Never used  Substance and Sexual Activity   Alcohol use: No   Drug use: No   Sexual activity: Not Currently  Other Topics Concern   Not on file  Social History Narrative   01/17/20 lives at [facility], [place]   Social Determinants of Health   Financial Resource Strain: Not on file  Food Insecurity: Not on file   Transportation Needs: Not on file  Physical Activity: Not on file  Stress: Not on file  Social Connections: Not on file  Intimate Partner Violence: Not on file   Family History  Problem Relation Age of Onset   Diabetes Mother    Hyperlipidemia Mother    Hypertension Mother    Heart disease Father    Hyperlipidemia Father    Diabetes Sister    Hyperlipidemia Sister    Hypertension Sister    Cancer Brother    Heart disease Brother    Hyperlipidemia Brother    Hypertension Brother    Diabetes Sister       VITAL SIGNS BP 111/64   Pulse 68   Temp (!) 97.3 F (36.3 C)   Resp 18   Ht '5\' 4"'$  (1.626 m)   Wt 110 lb 3.2 oz (50 kg)   SpO2 97%   BMI 18.92 kg/m   Outpatient Encounter Medications as of 07/10/2022  Medication Sig   acetaminophen (TYLENOL) 325 MG tablet Take 2 tablets (650 mg total) by mouth every 6 (six) hours as needed for mild pain (or Fever >/= 101).   amLODipine (NORVASC) 5 MG tablet Take 5 mg by mouth daily.   aspirin EC 81 MG tablet Take 81 mg by mouth daily. Swallow whole.   cyanocobalamin (,VITAMIN B-12,) 1000 MCG/ML injection Inject 1 mL (1,000 mcg total) into the muscle every 30 (thirty) days.   dorzolamide (TRUSOPT) 2 % ophthalmic solution Place 1 drop into both eyes 2 (two) times daily. (0900 & 1700)   ergocalciferol (VITAMIN D2) 1.25 MG (50000 UT) capsule Take 50,000 Units by mouth once a week. Thursday   folic acid (FOLVITE) 1 MG tablet Take 1 mg by mouth daily. (0900)   gabapentin (NEURONTIN) 100 MG capsule Take 100 mg by mouth 2 (two) times daily. (2100)   Iron, Ferrous Sulfate, 325 (65 Fe) MG TABS Take 1 tablet by mouth daily.   losartan (COZAAR) 25 MG tablet Take 25 mg by mouth daily.   metoCLOPramide (REGLAN) 5 MG tablet Take 1 tablet (5 mg total) by mouth 3 (three) times daily before meals.   Netarsudil-Latanoprost (ROCKLATAN) 0.02-0.005 % SOLN Apply 1 drop to eye every evening.   NON FORMULARY Diet:Regular thin liquids  ALLERGIC TO FISH    omeprazole (PRILOSEC) 40 MG capsule Take 40 mg by mouth daily at 6 (six) AM. (0600)   rosuvastatin (CRESTOR) 10 MG tablet Take 10 mg by mouth daily.   senna-docusate (SENOKOT S) 8.6-50 MG tablet Take 1 tablet by mouth 2 (two) times daily.   sodium chloride 1 g tablet Take 1 g by mouth 2 (two) times daily with a meal.   docusate sodium (COLACE) 100 MG capsule Take 1 capsule (100  mg total) by mouth 2 (two) times daily. (Patient not taking: Reported on 07/10/2022)   No facility-administered encounter medications on file as of 07/10/2022.     SIGNIFICANT DIAGNOSTIC EXAMS  PREVIOUS  05-24-20: dexa: t score -2.387  NO NEW EXAMS.     LABS REVIEWED PREVIOUS    06-25-21: glucose 82; bun 31; creat 1.26; k+ 4.6; na++ 138; ca 9.1; GFR 44  08-31-21: wbc 6.5; hgb 10.4; hct 32.5; mcv 93.9 plt 181; glucose 118; bun 24; creat 1.59; k+ 4.2; na++ 133; ca 9.0; gfr 38 phos 4.7; albumin 3.9  09-22-21: wbc 6.3; hgb 9.2; hct 29.4; mcv 94.8 plt 182; glucose 91; bun 32; creat 1.43; k 4.1; na++ 133; ca 9.0; GFR 38; phos 4.7; albumin 3.9 10-13-21: urine micro-albumin 12.8 11-10-21: chol 149; ldl 83; trig 80 hdl 50 01-01-22: hgb a1c 5.4 01-14-22: wbc 5.8; hgb 10.2; hct 30.8; mc 89.8 plt 1409; glucose 101; bun 17; creat 1.14; k+ 4.4; na++ 128; ca 8.8; gfr 50; phos 3.5 albumin 3.7  01-22-22: glucose 78; bun 16; creat 1.13; k+ 4.5; na++ 134; ca 9.0; gfr 50  02-04-22: wbc 8.4; hgb 11.5; hct 36.1; mcv 93.p plt 211; glucose 121; bun 17; creat 1.21; k+ 4.7; na++ 134; ca 9.5; gfr 46; protein 8.3; albumin 4.4; vitamin B 12; 913; folate >40; iron 28; tibc 210  02-05-22: tsh 1.858; chol 117; ldl 63 trig 38 hdl 46  06-04-22: wbc 5.5; hgb 9.9; hct 31.6; mcv 92.4 plt 161; glucose 84; bun 19; creat 1.32; k+ 4.5; na++ 136; ca 9.0; gfr 42; phos 4.2; albumin 3.6; iron 33; tibc 162; ferritin 392; urine ACR: 0.19  NO NEW LABS.  Review of Systems  Constitutional:  Negative for malaise/fatigue.  Respiratory:  Negative for cough and shortness  of breath.   Cardiovascular:  Negative for chest pain, palpitations and leg swelling.  Gastrointestinal:  Negative for abdominal pain, constipation and heartburn.  Musculoskeletal:  Negative for back pain, joint pain and myalgias.  Skin: Negative.   Neurological:  Negative for dizziness.  Psychiatric/Behavioral:  The patient is not nervous/anxious.    Physical Exam Constitutional:      General: She is not in acute distress.    Appearance: She is well-developed. She is not diaphoretic.  Neck:     Thyroid: No thyromegaly.  Cardiovascular:     Rate and Rhythm: Normal rate and regular rhythm.     Pulses: Normal pulses.     Heart sounds: Normal heart sounds.  Pulmonary:     Effort: Pulmonary effort is normal. No respiratory distress.     Breath sounds: Normal breath sounds.  Abdominal:     General: Bowel sounds are normal. There is no distension.     Palpations: Abdomen is soft.     Tenderness: There is no abdominal tenderness.  Musculoskeletal:        General: Normal range of motion.     Cervical back: Neck supple.     Right lower leg: No edema.     Left lower leg: No edema.  Lymphadenopathy:     Cervical: No cervical adenopathy.  Skin:    General: Skin is warm and dry.  Neurological:     Mental Status: She is alert and oriented to person, place, and time.  Psychiatric:        Mood and Affect: Mood normal.       ASSESSMENT/ PLAN:  TODAY   Diabetic peripheral neuropathy associated with type 2 diabetes mellitus: is on gabapentin 200  mg twice daily   2. Other chronic gastritis without hemorrhage: will continue prilosec 40 mg daily reglan 5 mg with meals.   3. Type 2 diabetes mellitus with stage 3 chronic kidney disease without long term current use of insulin: hgb A1c 5.3; 81 mg asa; is on statin and arb   4. Hyperlipidemia associated with type 2 diabetes mellitus: ldl 63 is on crestor 10 mg daily    PREVIOUS   5. Hypertension associated with stage 3 chronic kidney  disease due to type 2 diabetes mellitus: b/p 114/64 will continue norvasc 5 mg daily  cozaar 25 mg daily   6. Anemia due to chronic renal failure treated with erythropoietin stage 3: hgb 11.5 will continue iron daily; is seen by oncology for injections.  7. Increased intraocular pressure bilateral: will continue trusopt to both eyes twice daily and rocklatan to both eyes nightly   8. Vitamin B 12 deficiency: stable level 913; folate >40; will continue monthly injections and folic acid 1 mg daily   9. Vitamin D deficiency: level 41.14 will continue 50,000 units weekly   10. Thyroiditis: tsh  1.858 will monitor  11. Cervical myelopathy: will monitor   12. Failure to thrive in adult: weight is 110 pounds; will continue supplements as directed   13. Aortic atherosclerosis (ct 12-23-19) will monitor   14. 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 Chronic obstructive pulmonary disease unspecified COPD type: will monitor   16. CKD stage 3 due to type 2 diabetes mellitus: bun 19; creat 1.36; gfr [place] NP [facility]  call [phone]

## 2022-07-11 ENCOUNTER — Other Ambulatory Visit (HOSPITAL_COMMUNITY)
Admission: RE | Admit: 2022-07-11 | Discharge: 2022-07-11 | Disposition: A | Discharge status: Home / Self Care | Payer: Medicare Other | Source: Skilled Nursing Facility | Attending: Internal Medicine | Admitting: Internal Medicine

## 2022-07-11 DIAGNOSIS — N1832 Chronic kidney disease, stage 3b: Secondary | ICD-10-CM | POA: Insufficient documentation

## 2022-07-11 LAB — CBC WITH DIFFERENTIAL/PLATELET
Abs Immature Granulocytes: 0.01 10*3/uL (ref 0.00–0.07)
Basophils Absolute: 0 10*3/uL (ref 0.0–0.1)
Basophils Relative: 1 %
Eosinophils Absolute: 0.6 10*3/uL — ABNORMAL HIGH (ref 0.0–0.5)
Eosinophils Relative: 10 %
HCT: 31.8 % — ABNORMAL LOW (ref 36.0–46.0)
Hemoglobin: 10.1 g/dL — ABNORMAL LOW (ref 12.0–15.0)
Immature Granulocytes: 0 %
Lymphocytes Relative: 32 %
Lymphs Abs: 1.8 10*3/uL (ref 0.7–4.0)
MCH: 29.6 pg (ref 26.0–34.0)
MCHC: 31.8 g/dL (ref 30.0–36.0)
MCV: 93.3 fL (ref 80.0–100.0)
Monocytes Absolute: 0.5 10*3/uL (ref 0.1–1.0)
Monocytes Relative: 8 %
Neutro Abs: 2.9 10*3/uL (ref 1.7–7.7)
Neutrophils Relative %: 49 %
Platelets: 138 10*3/uL — ABNORMAL LOW (ref 150–400)
RBC: 3.41 MIL/uL — ABNORMAL LOW (ref 3.87–5.11)
RDW: 14.1 % (ref 11.5–15.5)
WBC: 5.8 10*3/uL (ref 4.0–10.5)
nRBC: 0 % (ref 0.0–0.2)

## 2022-07-11 LAB — RENAL FUNCTION PANEL
Albumin: 3.7 g/dL (ref 3.5–5.0)
Anion gap: 8 (ref 5–15)
BUN: 22 mg/dL (ref 8–23)
CO2: 25 mmol/L (ref 22–32)
Calcium: 9.1 mg/dL (ref 8.9–10.3)
Chloride: 104 mmol/L (ref 98–111)
Creatinine, Ser: 1.26 mg/dL — ABNORMAL HIGH (ref 0.44–1.00)
GFR, Estimated: 44 mL/min — ABNORMAL LOW (ref >60)
Glucose, Bld: 95 mg/dL (ref 70–99)
Phosphorus: 4.1 mg/dL (ref 2.5–4.6)
Potassium: 4.5 mmol/L (ref 3.5–5.1)
Sodium: 137 mmol/L (ref 135–145)

## 2022-07-11 LAB — PROTEIN / CREATININE RATIO, URINE
Creatinine, Urine: 48 mg/dL
Protein Creatinine Ratio: 0.15 mg/mg{Cre} (ref 0.00–0.15)
Total Protein, Urine: 7 mg/dL

## 2022-07-12 LAB — PARATHYROID HORMONE, INTACT (NO CA): PTH: 35 pg/mL (ref 15–65)

## 2022-07-17 ENCOUNTER — Non-Acute Institutional Stay (SKILLED_NURSING_FACILITY): Payer: Medicare Other | Admitting: Internal Medicine

## 2022-07-17 ENCOUNTER — Encounter: Payer: Self-pay | Admitting: Internal Medicine

## 2022-07-17 DIAGNOSIS — E1122 Type 2 diabetes mellitus with diabetic chronic kidney disease: Secondary | ICD-10-CM | POA: Diagnosis not present

## 2022-07-17 DIAGNOSIS — E1142 Type 2 diabetes mellitus with diabetic polyneuropathy: Secondary | ICD-10-CM

## 2022-07-17 DIAGNOSIS — I10 Essential (primary) hypertension: Secondary | ICD-10-CM | POA: Diagnosis not present

## 2022-07-17 DIAGNOSIS — R6889 Other general symptoms and signs: Secondary | ICD-10-CM

## 2022-07-17 DIAGNOSIS — N183 Chronic kidney disease, stage 3 unspecified: Secondary | ICD-10-CM

## 2022-07-17 DIAGNOSIS — F339 Major depressive disorder, recurrent, unspecified: Secondary | ICD-10-CM

## 2022-07-17 NOTE — Assessment & Plan Note (Signed)
Clinically alert , oriented & very communicative. Clinically not depressed

## 2022-07-17 NOTE — Patient Instructions (Signed)
See assessment and plan under each diagnosis in the problem list and acutely for this visit 

## 2022-07-17 NOTE — Progress Notes (Unsigned)
   NURSING HOME LOCATION:  [facility] ROOM NUMBER:  150 P  CODE STATUS:  DNR  PCP:  Ok Edwards NP  This is a nursing facility follow up visit of chronic medical diagnoses & to document compliance with Regulation 483.30 (c) in The [place] Manual Phase 2 which mandates caregiver visit ( visits can alternate among physician, PA or NP as per statutes) within 10 days of 30 days / 60 days/ 90 days post admission to SNF date    Interim medical record and care since last SNF visit was updated with review of diagnostic studies and change in clinical status since last visit were documented.  HPI:  Review of systems: Dementia invalidated responses. Date given as   Constitutional: No fever, significant weight change, fatigue  Eyes: No redness, discharge, pain, vision change ENT/mouth: No nasal congestion,  purulent discharge, earache, change in hearing, sore throat  Cardiovascular: No chest pain, palpitations, paroxysmal nocturnal dyspnea, claudication, edema  Respiratory: No cough, sputum production, hemoptysis, DOE, significant snoring, apnea   Gastrointestinal: No heartburn, dysphagia, abdominal pain, nausea /vomiting, rectal bleeding, melena, change in bowels Genitourinary: No dysuria, hematuria, pyuria, incontinence, nocturia Musculoskeletal: No joint stiffness, joint swelling, weakness, pain Dermatologic: No rash, pruritus, change in appearance of skin Neurologic: No dizziness, headache, syncope, seizures, numbness, tingling Psychiatric: No significant anxiety, depression, insomnia, anorexia Endocrine: No change in hair/skin/nails, excessive thirst, excessive hunger, excessive urination  Hematologic/lymphatic: No significant bruising, lymphadenopathy, abnormal bleeding Allergy/immunology: No itchy/watery eyes, significant sneezing, urticaria, angioedema  Physical exam:  Pertinent or positive findings: General appearance: Adequately nourished; no acute  distress, increased work of breathing is present.   Lymphatic: No lymphadenopathy about the head, neck, axilla. Eyes: No conjunctival inflammation or lid edema is present. There is no scleral icterus. Ears:  External ear exam shows no significant lesions or deformities.   Nose:  External nasal examination shows no deformity or inflammation. Nasal mucosa are pink and moist without lesions, exudates Oral exam:  Lips and gums are healthy appearing. There is no oropharyngeal erythema or exudate. Neck:  No thyromegaly, masses, tenderness noted.    Heart:  Normal rate and regular rhythm. S1 and S2 normal without gallop, murmur, click, rub .  Lungs: Chest clear to auscultation without wheezes, rhonchi, rales, rubs. Abdomen: Bowel sounds are normal. Abdomen is soft and nontender with no organomegaly, hernias, masses. GU: Deferred  Extremities:  No cyanosis, clubbing, edema  Neurologic exam : Cn 2-7 intact Strength equal  in upper & lower extremities Balance, Rhomberg, finger to nose testing could not be completed due to clinical state Deep tendon reflexes are equal Skin: Warm & dry w/o tenting. No significant lesions or rash.  See summary under each active problem in the Problem List with associated updated therapeutic plan

## 2022-07-18 NOTE — Assessment & Plan Note (Signed)
07/11/2022 creatinine was 1.26 and GFR 44 indicating high stage IIIb CKD.  Med list reviewed; no change indicated unless there is progression of CKD.

## 2022-07-18 NOTE — Assessment & Plan Note (Signed)
BP controlled; no change in antihypertensive medications  

## 2022-07-18 NOTE — Assessment & Plan Note (Addendum)
She is on gabapentin but continues to describe some numbness and tingling in her feet and occasionally in the upper extremities.   CKD will limit Gabapentin dosage. A1c was prediabetic at 5.4% on 01/01/2022.

## 2022-07-18 NOTE — Assessment & Plan Note (Addendum)
There is been no change in her cold intolerance and she will wear a sweater, wool coat & wool mittens despite normal ambient room temperature.  TSH is sufficiently current and therapeutic.  Her cold intolerance most likely relates to her body habitus with absence of subcutaneous fat.

## 2022-07-22 DIAGNOSIS — E1169 Type 2 diabetes mellitus with other specified complication: Secondary | ICD-10-CM | POA: Insufficient documentation

## 2022-08-19 ENCOUNTER — Inpatient Hospital Stay: Payer: Medicare Other | Attending: Hematology

## 2022-08-19 DIAGNOSIS — Z86718 Personal history of other venous thrombosis and embolism: Secondary | ICD-10-CM | POA: Insufficient documentation

## 2022-08-19 DIAGNOSIS — Z87891 Personal history of nicotine dependence: Secondary | ICD-10-CM | POA: Insufficient documentation

## 2022-08-19 DIAGNOSIS — N1832 Chronic kidney disease, stage 3b: Secondary | ICD-10-CM | POA: Insufficient documentation

## 2022-08-19 DIAGNOSIS — D631 Anemia in chronic kidney disease: Secondary | ICD-10-CM | POA: Insufficient documentation

## 2022-08-19 DIAGNOSIS — E538 Deficiency of other specified B group vitamins: Secondary | ICD-10-CM | POA: Insufficient documentation

## 2022-08-25 NOTE — Progress Notes (Deleted)
[place], [place] [address]   CLINIC:  Medical Oncology/Hematology  PCP:  Gerlene Fee, NP [place] [address] [phone]   REASON FOR VISIT:  Follow-up for anemia due to CKD stage IIIb   PRIOR THERAPY: Epogen, Retacrit   CURRENT THERAPY: Observation  INTERVAL HISTORY:   Traci Parker 77 y.o. female returns for routine follow-up of anemia.  She was last seen by Tarri Abernethy PA-C on 08/20/2021.   At today's visit, she reports feeling ***.  No recent hospitalizations, surgeries, or changes in baseline health status.  She reports good energy and denies any abnormal fatigue. *** She denies any hematemesis, hematochezia, melena, or epistaxis. *** No pica, restless legs, or headaches.  ***No chest pain, dyspnea on exertion, lightheadedness, or syncope.  *** She denies any unilateral leg swelling, pain, and erythema. ***  No shortness of breath, dyspnea on exertion, chest pain, cough, hemoptysis, and palpitations.  *** She continues to take ferrous sulfate daily with Colace.   She has ***% energy and ***% appetite. She endorses that she is maintaining a stable weight.   ASSESSMENT & PLAN:  1.  Normocytic anemia, secondary to CKD and relative iron deficiency - Etiology is from combination of CKD and relative iron deficiency. - Initial work-up included SPEP which was negative.  Stool was negative for occult blood.  Hemoglobin electrophoresis showed severe microcytosis related to her anemia.  She could have underlying thalassemia/sickle cell trait. - Last colonoscopy was in June 2019 in [place], 3 polyps removed. - She has a history of needing a blood transfusion in the early 2000's. - She denies any bright red blood per rectum or melena      - We took over Epogen injections for Dr. Theador Hawthorne, but patient has not required Retacrit for the past 6 months due to Hgb > 11.0 (last Retacrit was 02/11/2021), therefore Retacrit  protocol discontinued. - She was started on oral ferrous sulfate at last visit, but reported significant constipation, which was relieved by Colace     *** - Most recent labs (08/26/2022): *** - PLAN: No indication for IV iron or ESA at this time.*** - Continue oral iron supplementation with Colace twice daily.*** -  RTC in 6 months for repeat labs and office visit.       2.  CKD stage IIIa/B - Ultrasound the kidneys on 08/22/2019 showed bilateral renal atrophy, severe atrophy of the right kidney. - PLAN: Continue follow-up with Dr. Theador Hawthorne   3.  [id number] and folic acid deficiency: - She is on B12 injections monthly at SNF - She takes daily folic acid supplement - Labs (02/04/2022) showed normal B12, folate, and methylmalonic acid*** - PLAN: Continue monthly B12 injections and daily folic acid supplement at SNF   4.  History of lower extremity DVT - Venous duplex (02/02/2020): Extensive acute appearing left femoropopliteal occlusive DVT extending into the calf veins. - Considered provoked in the setting of immobility/bedbound functional status - Venous ultrasound (03/24/2022) shows a previous left-sided femoral, popliteal, and DVT from 2021 has completely resolved - Eliquis was discontinued by her PCP, placed on aspirin and statin - No current signs or symptoms of recurrent DVT       PLAN SUMMARY: >> *** >> *** >> ***    REVIEW OF SYSTEMS: ***  Review of Systems - Oncology   PHYSICAL EXAM:  ECOG PERFORMANCE STATUS: {CHL ONC ECOG [id number] *** There were no vitals filed for this visit.  There were no vitals filed for this visit. Physical Exam  PAST MEDICAL/SURGICAL HISTORY:  Past Medical History:  Diagnosis Date   Allergy    Anemia    pernicious anemia   Arthritis    Asthma    Blood transfusion without reported diagnosis    CKD (chronic kidney disease)    COPD (chronic obstructive pulmonary disease) ([place]) 12/23/2019   Diabetic peripheral neuropathy associated with type 2  diabetes mellitus ([place]) 04/07/2021   DM (diabetes mellitus), type 2 with peripheral vascular complications ([place]) [id number]   Gastritis    GERD (gastroesophageal reflux disease)    Glaucoma    History of blood in urine    Hyperlipidemia    Hypertension    Neuropathy    both feet   Ocular hypertension    Osteoporosis    Stroke ([place])    Vitamin B12 deficiency anemia due to intrinsic factor deficiency    Past Surgical History:  Procedure Laterality Date   CHOLECYSTECTOMY  2008   COLONOSCOPY  04/30/2011   Procedure: COLONOSCOPY;  Surgeon: Rogene Houston, MD;  [facility];  Service: Endoscopy;  Laterality: N/A;   COLONOSCOPY  05/25/2012   Procedure: COLONOSCOPY;  Surgeon: Rogene Houston, MD;  [facility];  Service: Endoscopy;  Laterality: N/A;  1:25-changed to 1200 Ann to notify pt   COLONOSCOPY Bilateral 12/2017   COLONOSCOPY WITH PROPOFOL N/A 11/29/2020   Procedure: COLONOSCOPY WITH PROPOFOL;  Surgeon: Harvel Quale, MD;  [facility];  Service: Gastroenterology;  Laterality: N/A;  1:35 patient is in the [facility]   ESOPHAGOGASTRODUODENOSCOPY N/A 12/07/2019   Procedure: ESOPHAGOGASTRODUODENOSCOPY (EGD);  Surgeon: Rogene Houston, MD;  [facility];  Service: Endoscopy;  Laterality: N/A;  155   GALLBLADDER SURGERY     right breast cystectomy  1988   TUBAL LIGATION  1975    SOCIAL HISTORY:  Social History   Socioeconomic History   Marital status: Divorced    Spouse name: Not on file   Number of children: 4   Years of education: 9   Highest education level: Not on file  Occupational History   Occupation: retired   Tobacco Use   Smoking status: Former    Packs/day: 1.00    Years: 40.00    Total pack years: 40.00    Types: Cigarettes    Quit date: 04/03/2011    Years since quitting: 11.4   Smokeless tobacco: Never  Vaping Use   Vaping Use: Never used  Substance and Sexual Activity   Alcohol use: No   Drug  use: No   Sexual activity: Not Currently  Other Topics Concern   Not on file  Social History Narrative   01/17/20 lives at [facility], [place]   Social Determinants of Health   Financial Resource Strain: Not on file  Food Insecurity: Not on file  Transportation Needs: Not on file  Physical Activity: Not on file  Stress: Not on file  Social Connections: Not on file  Intimate Partner Violence: Not on file    FAMILY HISTORY:  Family History  Problem Relation Age of Onset   Diabetes Mother    Hyperlipidemia Mother    Hypertension Mother    Heart disease Father    Hyperlipidemia Father    Diabetes Sister    Hyperlipidemia Sister    Hypertension Sister    Cancer Brother    Heart disease Brother    Hyperlipidemia Brother    Hypertension  Brother    Diabetes Sister     CURRENT MEDICATIONS:  Outpatient Encounter Medications as of 08/26/2022  Medication Sig   acetaminophen (TYLENOL) 325 MG tablet Take 2 tablets (650 mg total) by mouth every 6 (six) hours as needed for mild pain (or Fever >/= 101).   amLODipine (NORVASC) 5 MG tablet Take 5 mg by mouth daily.   aspirin EC 81 MG tablet Take 81 mg by mouth daily. Swallow whole.   cyanocobalamin (,VITAMIN B-12,) 1000 MCG/ML injection Inject 1 mL (1,000 mcg total) into the muscle every 30 (thirty) days.   docusate sodium (COLACE) 100 MG capsule Take 1 capsule (100 mg total) by mouth 2 (two) times daily. (Patient not taking: Reported on 07/10/2022)   dorzolamide (TRUSOPT) 2 % ophthalmic solution Place 1 drop into both eyes 2 (two) times daily. (0900 & 1700)   ergocalciferol (VITAMIN D2) 1.25 MG (50000 UT) capsule Take 50,000 Units by mouth once a week. Thursday   folic acid (FOLVITE) 1 MG tablet Take 1 mg by mouth daily. (0900)   gabapentin (NEURONTIN) 100 MG capsule Take 100 mg by mouth 2 (two) times daily. (2100)   Iron, Ferrous Sulfate, 325 (65 Fe) MG TABS Take 1 tablet by mouth daily.   losartan (COZAAR) 25 MG tablet Take 25 mg by  mouth daily.   metoCLOPramide (REGLAN) 5 MG tablet Take 1 tablet (5 mg total) by mouth 3 (three) times daily before meals.   Netarsudil-Latanoprost (ROCKLATAN) 0.02-0.005 % SOLN Apply 1 drop to eye every evening.   NON FORMULARY Diet:Regular thin liquids  ALLERGIC TO FISH   omeprazole (PRILOSEC) 40 MG capsule Take 40 mg by mouth daily at 6 (six) AM. (0600)   rosuvastatin (CRESTOR) 10 MG tablet Take 10 mg by mouth daily.   senna-docusate (SENOKOT S) 8.6-50 MG tablet Take 1 tablet by mouth 2 (two) times daily.   sodium chloride 1 g tablet Take 1 g by mouth 2 (two) times daily with a meal.   No facility-administered encounter medications on file as of 08/26/2022.    ALLERGIES:  Allergies  Allergen Reactions   Latex Rash   Levofloxacin Nausea And Vomiting   Neomycin-Bacitracin-Polymyxin  [Bacitracin-Neomycin-Polymyxin] Rash   Other Swelling and Rash    TOMATO  SOUP-PER MAR   Prednisone Hives, Itching, Rash and Other (See Comments)   Fish Allergy     rash   Shellfish Allergy Hives   Neosporin [Neomycin-Polymyxin-Gramicidin] Itching and Rash    LABORATORY DATA:  I have reviewed the labs as listed.  CBC    Component Value Date/Time   WBC 5.8 07/11/2022 0734   RBC 3.41 (L) 07/11/2022 0734   HGB 10.1 (L) 07/11/2022 0734   HCT 31.8 (L) 07/11/2022 0734   PLT 138 (L) 07/11/2022 0734   MCV 93.3 07/11/2022 0734   MCH 29.6 07/11/2022 0734   MCHC 31.8 07/11/2022 0734   RDW 14.1 07/11/2022 0734   LYMPHSABS 1.8 07/11/2022 0734   MONOABS 0.5 07/11/2022 0734   EOSABS 0.6 (H) 07/11/2022 0734   BASOSABS 0.0 07/11/2022 0734      Latest Ref Rng & Units 07/11/2022    7:33 AM 06/04/2022    9:22 AM 02/04/2022   10:18 AM  CMP  Glucose 70 - 99 mg/dL 95  84  121   BUN 8 - 23 mg/dL 22  19  17   $ Creatinine 0.44 - 1.00 mg/dL 1.26  1.32  1.21   Sodium 135 - 145 mmol/L 137  136  134  Potassium 3.5 - 5.1 mmol/L 4.5  4.5  4.7   Chloride 98 - 111 mmol/L 104  104  101   CO2 22 - 32 mmol/L 25  27   25   $ Calcium 8.9 - 10.3 mg/dL 9.1  9.0  9.5   Total Protein 6.5 - 8.1 g/dL   8.3   Total Bilirubin 0.3 - 1.2 mg/dL   0.7   Alkaline Phos 38 - 126 U/L   89   AST 15 - 41 U/L   9   ALT 0 - 44 U/L   9     DIAGNOSTIC IMAGING:  I have independently reviewed the relevant imaging and discussed with the patient.   WRAP UP:  All questions were answered. The patient knows to call the clinic with any problems, questions or concerns.  Medical decision making: ***  Time spent on visit: I spent *** minutes counseling the patient face to face. The total time spent in the appointment was *** minutes and more than 50% was on counseling.  Harriett Rush, PA-C  ***

## 2022-08-26 ENCOUNTER — Inpatient Hospital Stay: Payer: Medicare Other | Admitting: Physician Assistant

## 2022-08-26 ENCOUNTER — Non-Acute Institutional Stay (SKILLED_NURSING_FACILITY): Payer: Medicare Other | Admitting: Adult Health

## 2022-08-26 ENCOUNTER — Inpatient Hospital Stay: Payer: Medicare Other

## 2022-08-26 ENCOUNTER — Encounter: Payer: Self-pay | Admitting: Adult Health

## 2022-08-26 DIAGNOSIS — E1122 Type 2 diabetes mellitus with diabetic chronic kidney disease: Secondary | ICD-10-CM | POA: Diagnosis not present

## 2022-08-26 DIAGNOSIS — N183 Chronic kidney disease, stage 3 unspecified: Secondary | ICD-10-CM | POA: Diagnosis not present

## 2022-08-26 DIAGNOSIS — E538 Deficiency of other specified B group vitamins: Secondary | ICD-10-CM

## 2022-08-26 DIAGNOSIS — D631 Anemia in chronic kidney disease: Secondary | ICD-10-CM

## 2022-08-26 DIAGNOSIS — I129 Hypertensive chronic kidney disease with stage 1 through stage 4 chronic kidney disease, or unspecified chronic kidney disease: Secondary | ICD-10-CM

## 2022-08-26 DIAGNOSIS — H40053 Ocular hypertension, bilateral: Secondary | ICD-10-CM

## 2022-08-26 NOTE — Progress Notes (Signed)
Location:  [facility] Room Number: 150 P Place of Service:  SNF (31)   CODE STATUS: DNR  Allergies  Allergen Reactions   Latex Rash   Levofloxacin Nausea And Vomiting   Neomycin-Bacitracin-Polymyxin  [Bacitracin-Neomycin-Polymyxin] Rash   Other Swelling and Rash    TOMATO  SOUP-PER MAR   Prednisone Hives, Itching, Rash and Other (See Comments)   Fish Allergy     rash   Shellfish Allergy Hives   Neosporin [Neomycin-Polymyxin-Gramicidin] Itching and Rash    Chief Complaint  Patient presents with   Medical Management of Chronic Issues                        Hypertension associated with stage 3 chronic kidney disease due to type 2 diabetes mellitus: Anemia due to chronic renal failure treated with erythropoietin stage 3:  Increased intraocular pressure bilateral: Vitamin B 12 deficiency     HPI:  She is a 77 year old long term resident of this facility being seen for the management of her chronic illnesses: Hypertension associated with stage 3 chronic kidney disease due to type 2 diabetes mellitus: Anemia due to chronic renal failure treated with erythropoietin stage 3:  Increased intraocular pressure bilateral: Vitamin B 12 deficiency. There are no reports of uncontrolled pain. She continues to get out of bed daily; wears gloves to both hands due to neuropathy.   Past Medical History:  Diagnosis Date   Allergy    Anemia    pernicious anemia   Arthritis    Asthma    Blood transfusion without reported diagnosis    CKD (chronic kidney disease)    COPD (chronic obstructive pulmonary disease) ([place]) 12/23/2019   Diabetic peripheral neuropathy associated with type 2 diabetes mellitus ([place]) 04/07/2021   DM (diabetes mellitus), type 2 with peripheral vascular complications ([place]) 99991111   Gastritis    GERD (gastroesophageal reflux disease)    Glaucoma    History of blood in urine    Hyperlipidemia    Hypertension    Neuropathy    both feet   Ocular  hypertension    Osteoporosis    Stroke ([place])    Vitamin B12 deficiency anemia due to intrinsic factor deficiency     Past Surgical History:  Procedure Laterality Date   CHOLECYSTECTOMY  2008   COLONOSCOPY  04/30/2011   Procedure: COLONOSCOPY;  Surgeon: Rogene Houston, MD;  [facility];  Service: Endoscopy;  Laterality: N/A;   COLONOSCOPY  05/25/2012   Procedure: COLONOSCOPY;  Surgeon: Rogene Houston, MD;  [facility];  Service: Endoscopy;  Laterality: N/A;  1:25-changed to 1200 Ann to notify pt   COLONOSCOPY Bilateral 12/2017   COLONOSCOPY WITH PROPOFOL N/A 11/29/2020   Procedure: COLONOSCOPY WITH PROPOFOL;  Surgeon: Harvel Quale, MD;  [facility];  Service: Gastroenterology;  Laterality: N/A;  1:35 [facility] is in the [facility]   ESOPHAGOGASTRODUODENOSCOPY N/A 12/07/2019   Procedure: ESOPHAGOGASTRODUODENOSCOPY (EGD);  Surgeon: Rogene Houston, MD;  [facility];  Service: Endoscopy;  Laterality: N/A;  155   GALLBLADDER SURGERY     right breast cystectomy  1988   TUBAL LIGATION  1975    Social History   Socioeconomic History   Marital status: Divorced    Spouse name: Not on file   Number of children: 4   Years of education: 9   Highest education level: Not on file  Occupational History  Occupation: retired   Tobacco Use   Smoking status: Former    Packs/day: 1.00    Years: 40.00    Total pack years: 40.00    Types: Cigarettes    Quit date: 04/03/2011    Years since quitting: 11.4   Smokeless tobacco: Never  Vaping Use   Vaping Use: Never used  Substance and Sexual Activity   Alcohol use: No   Drug use: No   Sexual activity: Not Currently  Other Topics Concern   Not on file  Social History Narrative   01/17/20 lives at [facility], [place]   Social Determinants of Health   Financial Resource Strain: Not on file  Food Insecurity: Not on file  Transportation Needs: Not on file  Physical Activity:  Not on file  Stress: Not on file  Social Connections: Not on file  Intimate Partner Violence: Not on file   Family History  Problem Relation Age of Onset   Diabetes Mother    Hyperlipidemia Mother    Hypertension Mother    Heart disease Father    Hyperlipidemia Father    Diabetes Sister    Hyperlipidemia Sister    Hypertension Sister    Cancer Brother    Heart disease Brother    Hyperlipidemia Brother    Hypertension Brother    Diabetes Sister       VITAL SIGNS BP 107/69   Pulse 81   Temp (!) 96.9 F (36.1 C)   Ht 5' 4"$  (1.626 m)   Wt 112 lb 3.2 oz (50.9 kg)   BMI 19.26 kg/m   Outpatient Encounter Medications as of 08/26/2022  Medication Sig   acetaminophen (TYLENOL) 325 MG tablet Take 2 tablets (650 mg total) by mouth every 6 (six) hours as needed for mild pain (or Fever >/= 101).   amLODipine (NORVASC) 5 MG tablet Take 5 mg by mouth daily.   aspirin EC 81 MG tablet Take 81 mg by mouth daily. Swallow whole.   cyanocobalamin (,VITAMIN B-12,) 1000 MCG/ML injection Inject 1 mL (1,000 mcg total) into the muscle every 30 (thirty) days.   dorzolamide (TRUSOPT) 2 % ophthalmic solution Place 1 drop into both eyes 2 (two) times daily. (0900 & 1700)   ergocalciferol (VITAMIN D2) 1.25 MG (50000 UT) capsule Take 50,000 Units by mouth once a week. Thursday   folic acid (FOLVITE) 1 MG tablet Take 1 mg by mouth daily. (0900)   gabapentin (NEURONTIN) 100 MG capsule Take 100 mg by mouth 2 (two) times daily. (2100)   Iron, Ferrous Sulfate, 325 (65 Fe) MG TABS Take 1 tablet by mouth daily.   losartan (COZAAR) 25 MG tablet Take 25 mg by mouth daily.   metoCLOPramide (REGLAN) 5 MG tablet Take 1 tablet (5 mg total) by mouth 3 (three) times daily before meals.   Netarsudil-Latanoprost (ROCKLATAN) 0.02-0.005 % SOLN Apply 1 drop to eye every evening.   NON FORMULARY Diet:Regular thin liquids  ALLERGIC TO FISH   omeprazole (PRILOSEC) 40 MG capsule Take 40 mg by mouth daily at 6 (six) AM.  (0600)   rosuvastatin (CRESTOR) 10 MG tablet Take 10 mg by mouth daily.   senna-docusate (SENOKOT S) 8.6-50 MG tablet Take 1 tablet by mouth 2 (two) times daily.   sodium chloride 1 g tablet Take 1 g by mouth 2 (two) times daily with a meal.   [DISCONTINUED] docusate sodium (COLACE) 100 MG capsule Take 1 capsule (100 mg total) by mouth 2 (two) times daily. (Patient not taking:  Reported on 07/10/2022)   No facility-administered encounter medications on file as of 08/26/2022.     SIGNIFICANT DIAGNOSTIC EXAMS  PREVIOUS  05-24-20: dexa: t score -2.387  NO NEW EXAMS.     LABS REVIEWED PREVIOUS    08-31-21: wbc 6.5; hgb 10.4; hct 32.5; mcv 93.9 plt 181; glucose 118; bun 24; creat 1.59; k+ 4.2; na++ 133; ca 9.0; gfr 38 phos 4.7; albumin 3.9  09-22-21: wbc 6.3; hgb 9.2; hct 29.4; mcv 94.8 plt 182; glucose 91; bun 32; creat 1.43; k 4.1; na++ 133; ca 9.0; GFR 38; phos 4.7; albumin 3.9 10-13-21: urine micro-albumin 12.8 11-10-21: chol 149; ldl 83; trig 80 hdl 50 01-01-22: hgb a1c 5.4 01-14-22: wbc 5.8; hgb 10.2; hct 30.8; mc 89.8 plt 1409; glucose 101; bun 17; creat 1.14; k+ 4.4; na++ 128; ca 8.8; gfr 50; phos 3.5 albumin 3.7  01-22-22: glucose 78; bun 16; creat 1.13; k+ 4.5; na++ 134; ca 9.0; gfr 50  02-04-22: wbc 8.4; hgb 11.5; hct 36.1; mcv 93.p plt 211; glucose 121; bun 17; creat 1.21; k+ 4.7; na++ 134; ca 9.5; gfr 46; protein 8.3; albumin 4.4; vitamin B 12; 913; folate >40; iron 28; tibc 210  02-05-22: tsh 1.858; chol 117; ldl 63 trig 38 hdl 46  06-04-22: wbc 5.5; hgb 9.9; hct 31.6; mcv 92.4 plt 161; glucose 84; bun 19; creat 1.32; k+ 4.5; na++ 136; ca 9.0; gfr 42; phos 4.2; albumin 3.6; iron 33; tibc 162; ferritin 392; urine ACR: 0.19  NO NEW LABS.  Review of Systems  Constitutional:  Negative for malaise/fatigue.  Respiratory:  Negative for cough and shortness of breath.   Cardiovascular:  Negative for chest pain, palpitations and leg swelling.  Gastrointestinal:  Negative for abdominal pain,  constipation and heartburn.  Musculoskeletal:  Negative for back pain, joint pain and myalgias.  Skin: Negative.   Neurological:  Negative for dizziness.  Psychiatric/Behavioral:  The patient is not nervous/anxious.    Physical Exam Constitutional:      General: She is not in acute distress.    Appearance: She is well-developed. She is not diaphoretic.  Neck:     Thyroid: No thyromegaly.  Cardiovascular:     Rate and Rhythm: Normal rate and regular rhythm.     Pulses: Normal pulses.     Heart sounds: Normal heart sounds.  Pulmonary:     Effort: Pulmonary effort is normal. No respiratory distress.     Breath sounds: Normal breath sounds.  Abdominal:     General: Bowel sounds are normal. There is no distension.     Palpations: Abdomen is soft.     Tenderness: There is no abdominal tenderness.  Musculoskeletal:        General: Normal range of motion.     Cervical back: Neck supple.     Right lower leg: No edema.     Left lower leg: No edema.  Lymphadenopathy:     Cervical: No cervical adenopathy.  Skin:    General: Skin is warm and dry.  Neurological:     Mental Status: She is alert and oriented to person, place, and time.  Psychiatric:        Mood and Affect: Mood normal.         ASSESSMENT/ PLAN:  TODAY   Hypertension associated with stage 3 chronic kidney disease due to type 2 diabetes mellitus: b/p 107/69: will continue norvasc 5 mg daily and cozaar 25 mg daily   2. Anemia due to chronic renal failure treated with  erythropoietin stage 3: hgb 11.5 will continue iron daily and is followed by oncology  3. Increased intraocular pressure bilateral: will continue trusopt to both eyes twice daily and rocklatan to both eyes nightly   4. Vitamin B 12 deficiency: level 913 will change to po supplement   PREVIOUS   5. Vitamin D deficiency: level 41.14 will continue 50,000 units weekly   6. Thyroiditis: tsh  1.858 will monitor  7. Cervical myelopathy: will monitor    8. Failure to thrive in adult: weight is 112 pounds; will continue supplements as directed   9. Aortic atherosclerosis (ct 12-23-19) will monitor   10. History of dvt in left lower extremity: (02-02-20) induced by megace is on long term asa 81 mg daily   11. Chronic obstructive pulmonary disease unspecified COPD type: will monitor   12. CKD stage 3 due to type 2 diabetes mellitus: bun 19; creat 1.36; gfr 42   13. Diabetic peripheral neuropathy associated with type 2 diabetes mellitus: is on gabapentin 200 mg twice daily   14. Other chronic gastritis without hemorrhage: will continue prilosec 40 mg daily reglan 5 mg with meals.   15. Type 2 diabetes mellitus with stage 3 chronic kidney disease without long term current use of insulin: hgb A1c 5.3; 81 mg asa; is on statin and arb  16. Hyperlipidemia associated with type 2 diabetes mellitus: ldl 63 is on crestor 10 mg daily       Ok Edwards NP [facility]   call [phone]

## 2022-09-03 DIAGNOSIS — E1142 Type 2 diabetes mellitus with diabetic polyneuropathy: Secondary | ICD-10-CM | POA: Diagnosis not present

## 2022-09-03 DIAGNOSIS — E1151 Type 2 diabetes mellitus with diabetic peripheral angiopathy without gangrene: Secondary | ICD-10-CM | POA: Diagnosis not present

## 2022-09-03 DIAGNOSIS — L603 Nail dystrophy: Secondary | ICD-10-CM | POA: Diagnosis not present

## 2022-09-03 DIAGNOSIS — I739 Peripheral vascular disease, unspecified: Secondary | ICD-10-CM | POA: Diagnosis not present

## 2022-09-03 DIAGNOSIS — M79672 Pain in left foot: Secondary | ICD-10-CM | POA: Diagnosis not present

## 2022-09-03 DIAGNOSIS — M79671 Pain in right foot: Secondary | ICD-10-CM | POA: Diagnosis not present

## 2022-09-03 DIAGNOSIS — L84 Corns and callosities: Secondary | ICD-10-CM | POA: Diagnosis not present

## 2022-09-10 ENCOUNTER — Inpatient Hospital Stay: Payer: Medicare Other

## 2022-09-10 DIAGNOSIS — Z87891 Personal history of nicotine dependence: Secondary | ICD-10-CM | POA: Diagnosis not present

## 2022-09-10 DIAGNOSIS — D631 Anemia in chronic kidney disease: Secondary | ICD-10-CM

## 2022-09-10 DIAGNOSIS — Z86718 Personal history of other venous thrombosis and embolism: Secondary | ICD-10-CM | POA: Diagnosis not present

## 2022-09-10 DIAGNOSIS — E538 Deficiency of other specified B group vitamins: Secondary | ICD-10-CM

## 2022-09-10 DIAGNOSIS — N1832 Chronic kidney disease, stage 3b: Secondary | ICD-10-CM | POA: Diagnosis not present

## 2022-09-10 LAB — IRON AND TIBC
Iron: 63 ug/dL (ref 28–170)
Saturation Ratios: 33 % — ABNORMAL HIGH (ref 10.4–31.8)
TIBC: 191 ug/dL — ABNORMAL LOW (ref 250–450)
UIBC: 128 ug/dL

## 2022-09-10 LAB — CBC WITH DIFFERENTIAL/PLATELET
Abs Immature Granulocytes: 0.02 10*3/uL (ref 0.00–0.07)
Basophils Absolute: 0 10*3/uL (ref 0.0–0.1)
Basophils Relative: 1 %
Eosinophils Absolute: 0.4 10*3/uL (ref 0.0–0.5)
Eosinophils Relative: 7 %
HCT: 33 % — ABNORMAL LOW (ref 36.0–46.0)
Hemoglobin: 10.5 g/dL — ABNORMAL LOW (ref 12.0–15.0)
Immature Granulocytes: 0 %
Lymphocytes Relative: 31 %
Lymphs Abs: 2 10*3/uL (ref 0.7–4.0)
MCH: 29.6 pg (ref 26.0–34.0)
MCHC: 31.8 g/dL (ref 30.0–36.0)
MCV: 93 fL (ref 80.0–100.0)
Monocytes Absolute: 0.4 10*3/uL (ref 0.1–1.0)
Monocytes Relative: 7 %
Neutro Abs: 3.6 10*3/uL (ref 1.7–7.7)
Neutrophils Relative %: 54 %
Platelets: 181 10*3/uL (ref 150–400)
RBC: 3.55 MIL/uL — ABNORMAL LOW (ref 3.87–5.11)
RDW: 13.3 % (ref 11.5–15.5)
WBC: 6.5 10*3/uL (ref 4.0–10.5)
nRBC: 0 % (ref 0.0–0.2)

## 2022-09-10 LAB — COMPREHENSIVE METABOLIC PANEL
ALT: 12 U/L (ref 0–44)
AST: 12 U/L — ABNORMAL LOW (ref 15–41)
Albumin: 4.1 g/dL (ref 3.5–5.0)
Alkaline Phosphatase: 79 U/L (ref 38–126)
Anion gap: 8 (ref 5–15)
BUN: 19 mg/dL (ref 8–23)
CO2: 25 mmol/L (ref 22–32)
Calcium: 9.2 mg/dL (ref 8.9–10.3)
Chloride: 100 mmol/L (ref 98–111)
Creatinine, Ser: 1.27 mg/dL — ABNORMAL HIGH (ref 0.44–1.00)
GFR, Estimated: 43 mL/min — ABNORMAL LOW (ref >60)
Glucose, Bld: 113 mg/dL — ABNORMAL HIGH (ref 70–99)
Potassium: 4.6 mmol/L (ref 3.5–5.1)
Sodium: 133 mmol/L — ABNORMAL LOW (ref 135–145)
Total Bilirubin: 0.5 mg/dL (ref 0.3–1.2)
Total Protein: 7.4 g/dL (ref 6.5–8.1)

## 2022-09-10 LAB — FERRITIN: Ferritin: 429 ng/mL — ABNORMAL HIGH (ref 11–307)

## 2022-09-10 LAB — VITAMIN B12: Vitamin B-12: 1756 pg/mL — ABNORMAL HIGH (ref 180–914)

## 2022-09-10 LAB — FOLATE: Folate: >40 ng/mL (ref >5.9)

## 2022-09-15 LAB — METHYLMALONIC ACID, SERUM: Methylmalonic Acid, Quantitative: 171 nmol/L (ref 0–378)

## 2022-09-18 NOTE — Progress Notes (Deleted)
Traci Parker, [place] [address]   CLINIC:  Medical Oncology/Hematology  PCP:  Traci Fee, NP [place] [address] [phone]   REASON FOR VISIT:  Follow-up for anemia due to CKD stage IIIb   PRIOR THERAPY: Epogen, Retacrit   CURRENT THERAPY: Observation   INTERVAL HISTORY:   Traci Parker 77 y.o. female returns for routine follow-up of anemia.  She was last seen by Traci Abernethy PA-C on 08/20/2021.   At today's visit, she reports feeling ***.  No recent hospitalizations, surgeries, or changes in baseline health status.  She reports good energy and denies any abnormal fatigue. *** She denies any hematemesis, hematochezia, melena, or epistaxis. *** No pica, restless legs, or headaches.  ***No chest pain, dyspnea on exertion, lightheadedness, or syncope.  *** She denies any unilateral leg swelling, pain, and erythema. ***  No shortness of breath, dyspnea on exertion, chest pain, cough, hemoptysis, and palpitations.  *** She continues to take ferrous sulfate daily with Colace.   She has ***% energy and ***% appetite. She endorses that she is maintaining a stable weight.  ASSESSMENT & PLAN:  1.  Normocytic anemia, secondary to CKD and relative iron deficiency - Etiology is from combination of CKD and relative iron deficiency. - Initial work-up included SPEP which was negative.  Stool was negative for occult blood.  Hemoglobin electrophoresis showed severe microcytosis related to her anemia.  She could have underlying thalassemia/sickle cell trait. - Last colonoscopy was in June 2019 in [place], 3 polyps removed. - She has a history of needing a blood transfusion in the early 2000's. - She denies any bright red blood per rectum or melena      - We took over Epogen injections for Dr. Theador Parker, but patient has not required Retacrit for the past 6 months due to Hgb > 11.0 (last Retacrit was 02/11/2021), therefore Retacrit protocol  discontinued. - She was started on oral ferrous sulfate at last visit, but reported significant constipation, which was relieved by Colace     *** - Most recent labs (09/10/2022): Hgb 10.5/MCV 93.0, ferritin 429, iron saturation 33%. - PLAN: No indication for IV iron or ESA at this time.*** - Continue oral iron supplementation with Colace twice daily.*** -  RTC in 6 months for repeat labs and office visit.       2.  CKD stage IIIa/b - Ultrasound the kidneys on 08/22/2019 showed bilateral renal atrophy, severe atrophy of the right kidney. - Most recent CMP shows baseline CKD stage IIIb - PLAN: Continue follow-up with Dr. Theador Parker   3.  [id number] and folic acid deficiency: - She is on B12 injections monthly at SNF - She takes daily folic acid supplement - Labs (09/10/2022) showed normal B12, folate, and methylmalonic acid*** - PLAN: Continue monthly B12 injections and daily folic acid supplement at SNF   4.  History of lower extremity DVT - Venous duplex (02/02/2020): Extensive acute appearing left femoropopliteal occlusive DVT extending into the calf veins. - Considered provoked in the setting of immobility/bedbound functional status - Venous ultrasound (03/24/2022) shows a previous left-sided femoral, popliteal, and DVT from 2021 has completely resolved - Eliquis was discontinued by her PCP, placed on aspirin and statin - No current signs or symptoms of recurrent DVT   ***  PLAN SUMMARY: >> *** >> *** >> ***   [name] **   You were  seen today by Traci Abernethy PA-C for your ***.    *** ***  *** ***  LABS: Return in ***   OTHER TESTS: ***  MEDICATIONS: ***  FOLLOW-UP APPOINTMENT: ***     REVIEW OF SYSTEMS: ***  Review of Systems - Oncology   PHYSICAL EXAM:  ECOG PERFORMANCE STATUS: {CHL ONC ECOG [id number] *** There were no vitals filed for this visit. There were no vitals filed for  this visit. Physical Exam  PAST MEDICAL/SURGICAL HISTORY:  Past Medical History:  Diagnosis Date   Allergy    Anemia    pernicious anemia   Arthritis    Asthma    Blood transfusion without reported diagnosis    CKD (chronic kidney disease)    COPD (chronic obstructive pulmonary disease) ([place]) 12/23/2019   Diabetic peripheral neuropathy associated with type 2 diabetes mellitus ([place]) 04/07/2021   DM (diabetes mellitus), type 2 with peripheral vascular complications ([place]) [id number]   Gastritis    GERD (gastroesophageal reflux disease)    Glaucoma    History of blood in urine    Hyperlipidemia    Hypertension    Neuropathy    both feet   Ocular hypertension    Osteoporosis    Stroke ([place])    Vitamin B12 deficiency anemia due to intrinsic factor deficiency    Past Surgical History:  Procedure Laterality Date   CHOLECYSTECTOMY  2008   COLONOSCOPY  04/30/2011   Procedure: COLONOSCOPY;  Surgeon: Rogene Houston, MD;  [facility];  Service: Endoscopy;  Laterality: N/A;   COLONOSCOPY  05/25/2012   Procedure: COLONOSCOPY;  Surgeon: Rogene Houston, MD;  [facility];  Service: Endoscopy;  Laterality: N/A;  1:25-changed to 1200 Ann to notify pt   COLONOSCOPY Bilateral 12/2017   COLONOSCOPY WITH PROPOFOL N/A 11/29/2020   Procedure: COLONOSCOPY WITH PROPOFOL;  Surgeon: Harvel Quale, MD;  [facility];  Service: Gastroenterology;  Laterality: N/A;  1:35 patient is in the [facility]   ESOPHAGOGASTRODUODENOSCOPY N/A 12/07/2019   Procedure: ESOPHAGOGASTRODUODENOSCOPY (EGD);  Surgeon: Rogene Houston, MD;  [facility];  Service: Endoscopy;  Laterality: N/A;  155   GALLBLADDER SURGERY     right breast cystectomy  1988   TUBAL LIGATION  1975    SOCIAL HISTORY:  Social History   Socioeconomic History   Marital status: Divorced    Spouse name: Not on file   Number of children: 4   Years of education: 9   Highest education  level: Not on file  Occupational History   Occupation: retired   Tobacco Use   Smoking status: Former    Packs/day: 1.00    Years: 40.00    Total pack years: 40.00    Types: Cigarettes    Quit date: 04/03/2011    Years since quitting: 11.4   Smokeless tobacco: Never  Vaping Use   Vaping Use: Never used  Substance and Sexual Activity   Alcohol use: No   Drug use: No   Sexual activity: Not Currently  Other Topics Concern   Not on file  Social History Narrative   01/17/20 lives at [facility], [place]   Social Determinants of Health   Financial Resource Strain: Not on file  Food Insecurity: Not on file  Transportation Needs: Not on file  Physical Activity: Not on file  Stress: Not on file  Social Connections: Not on file  Intimate Partner Violence: Not on file    FAMILY HISTORY:  Family History  Problem Relation Age of Onset   Diabetes Mother    Hyperlipidemia Mother    Hypertension Mother    Heart disease Father    Hyperlipidemia Father    Diabetes Sister    Hyperlipidemia Sister    Hypertension Sister    Cancer Brother    Heart disease Brother    Hyperlipidemia Brother    Hypertension Brother    Diabetes Sister     CURRENT MEDICATIONS:  Outpatient Encounter Medications as of 09/21/2022  Medication Sig   acetaminophen (TYLENOL) 325 MG tablet Take 2 tablets (650 mg total) by mouth every 6 (six) hours as needed for mild pain (or Fever >/= 101).   amLODipine (NORVASC) 5 MG tablet Take 5 mg by mouth daily.   aspirin EC 81 MG tablet Take 81 mg by mouth daily. Swallow whole.   cyanocobalamin 1000 MCG tablet Take 1,000 mcg by mouth daily.   dorzolamide (TRUSOPT) 2 % ophthalmic solution Place 1 drop into both eyes 2 (two) times daily. (0900 & 1700)   ergocalciferol (VITAMIN D2) 1.25 MG (50000 UT) capsule Take 50,000 Units by mouth once a week. Thursday   folic acid (FOLVITE) 1 MG tablet Take 1 mg by mouth daily. (0900)   gabapentin (NEURONTIN) 100 MG capsule Take  100 mg by mouth 2 (two) times daily. (2100)   Iron, Ferrous Sulfate, 325 (65 Fe) MG TABS Take 1 tablet by mouth daily.   losartan (COZAAR) 25 MG tablet Take 25 mg by mouth daily.   metoCLOPramide (REGLAN) 5 MG tablet Take 1 tablet (5 mg total) by mouth 3 (three) times daily before meals.   Netarsudil-Latanoprost (ROCKLATAN) 0.02-0.005 % SOLN Apply 1 drop to eye every evening.   NON FORMULARY Diet:Regular thin liquids  ALLERGIC TO FISH   omeprazole (PRILOSEC) 40 MG capsule Take 40 mg by mouth daily at 6 (six) AM. (0600)   rosuvastatin (CRESTOR) 10 MG tablet Take 10 mg by mouth daily.   senna-docusate (SENOKOT S) 8.6-50 MG tablet Take 1 tablet by mouth 2 (two) times daily.   sodium chloride 1 g tablet Take 1 g by mouth 2 (two) times daily with a meal.   No facility-administered encounter medications on file as of 09/21/2022.    ALLERGIES:  Allergies  Allergen Reactions   Latex Rash   Levofloxacin Nausea And Vomiting   Neomycin-Bacitracin-Polymyxin  [Bacitracin-Neomycin-Polymyxin] Rash   Other Swelling and Rash    TOMATO  SOUP-PER MAR   Prednisone Hives, Itching, Rash and Other (See Comments)   Fish Allergy     rash   Shellfish Allergy Hives   Neosporin [Neomycin-Polymyxin-Gramicidin] Itching and Rash    LABORATORY DATA:  I have reviewed the labs as listed.  CBC    Component Value Date/Time   WBC 6.5 09/10/2022 1132   RBC 3.55 (L) 09/10/2022 1132   HGB 10.5 (L) 09/10/2022 1132   HCT 33.0 (L) 09/10/2022 1132   PLT 181 09/10/2022 1132   MCV 93.0 09/10/2022 1132   MCH 29.6 09/10/2022 1132   MCHC 31.8 09/10/2022 1132   RDW 13.3 09/10/2022 1132   LYMPHSABS 2.0 09/10/2022 1132   MONOABS 0.4 09/10/2022 1132   EOSABS 0.4 09/10/2022 1132   BASOSABS 0.0 09/10/2022 1132      Latest Ref Rng & Units 09/10/2022   11:32 AM 07/11/2022    7:33 AM 06/04/2022    9:22 AM  CMP  Glucose 70 - 99 mg/dL 113  95  84   BUN 8 -  23 mg/dL '19  22  19   '$ Creatinine 0.44 - 1.00 mg/dL 1.27  1.26   1.32   Sodium 135 - 145 mmol/L 133  137  136   Potassium 3.5 - 5.1 mmol/L 4.6  4.5  4.5   Chloride 98 - 111 mmol/L 100  104  104   CO2 22 - 32 mmol/L '25  25  27   '$ Calcium 8.9 - 10.3 mg/dL 9.2  9.1  9.0   Total Protein 6.5 - 8.1 g/dL 7.4     Total Bilirubin 0.3 - 1.2 mg/dL 0.5     Alkaline Phos 38 - 126 U/L 79     AST 15 - 41 U/L 12     ALT 0 - 44 U/L 12       DIAGNOSTIC IMAGING:  I have independently reviewed the relevant imaging and discussed with the patient.   WRAP UP:  All questions were answered. The patient knows to call the clinic with any problems, questions or concerns.  Medical decision making: ***  Time spent on visit: I spent *** minutes counseling the patient face to face. The total time spent in the appointment was *** minutes and more than 50% was on counseling.  Harriett Rush, PA-C  ***

## 2022-09-21 ENCOUNTER — Inpatient Hospital Stay: Payer: Medicare Other | Admitting: Physician Assistant

## 2022-09-23 DIAGNOSIS — H25813 Combined forms of age-related cataract, bilateral: Secondary | ICD-10-CM | POA: Diagnosis not present

## 2022-09-28 ENCOUNTER — Encounter: Payer: Self-pay | Admitting: Adult Health

## 2022-09-28 ENCOUNTER — Non-Acute Institutional Stay (SKILLED_NURSING_FACILITY): Payer: Medicare Other | Admitting: Adult Health

## 2022-09-28 DIAGNOSIS — E559 Vitamin D deficiency, unspecified: Secondary | ICD-10-CM

## 2022-09-28 DIAGNOSIS — R627 Adult failure to thrive: Secondary | ICD-10-CM | POA: Diagnosis not present

## 2022-09-28 DIAGNOSIS — G959 Disease of spinal cord, unspecified: Secondary | ICD-10-CM

## 2022-09-28 DIAGNOSIS — E069 Thyroiditis, unspecified: Secondary | ICD-10-CM

## 2022-09-28 NOTE — Progress Notes (Unsigned)
Location:  [facility] Room Number: [id number] Place of Service:  SNF (31)   CODE STATUS: DNR  Allergies  Allergen Reactions   Latex Rash   Levofloxacin Nausea And Vomiting   Neomycin-Bacitracin-Polymyxin  [Bacitracin-Neomycin-Polymyxin] Rash   Other Swelling and Rash    TOMATO  SOUP-PER MAR   Prednisone Hives, Itching, Rash and Other (See Comments)   Fish Allergy     rash   Shellfish Allergy Hives   Neosporin [Neomycin-Polymyxin-Gramicidin] Itching and Rash    Chief Complaint  Patient presents with   Medical Management of Chronic Issues                   Vitamin D deficiency:  Thyroiditis:  Cervical myelopathy:  Failure to thrive in adult:     HPI:  She is a 77 year old long term resident of this facility being seen for the management of her chronic illnesses: Vitamin D deficiency:  Thyroiditis:  Cervical myelopathy:  Failure to thrive in adult. Her weight is stable; no pain present. She does continue to wear gloves.   Past Medical History:  Diagnosis Date   Allergy    Anemia    pernicious anemia   Arthritis    Asthma    Blood transfusion without reported diagnosis    CKD (chronic kidney disease)    COPD (chronic obstructive pulmonary disease) ([place]) 12/23/2019   Diabetic peripheral neuropathy associated with type 2 diabetes mellitus ([place]) 04/07/2021   DM (diabetes mellitus), type 2 with peripheral vascular complications ([place]) 99991111   Gastritis    GERD (gastroesophageal reflux disease)    Glaucoma    History of blood in urine    Hyperlipidemia    Hypertension    Neuropathy    both feet   Ocular hypertension    Osteoporosis    Stroke ([place])    Vitamin B12 deficiency anemia due to intrinsic factor deficiency     Past Surgical History:  Procedure Laterality Date   CHOLECYSTECTOMY  2008   COLONOSCOPY  04/30/2011   Procedure: COLONOSCOPY;  Surgeon: Rogene Houston, MD;  [facility];  Service: Endoscopy;  Laterality: N/A;    COLONOSCOPY  05/25/2012   Procedure: COLONOSCOPY;  Surgeon: Rogene Houston, MD;  [facility];  Service: Endoscopy;  Laterality: N/A;  1:25-changed to 1200 Ann to notify pt   COLONOSCOPY Bilateral 12/2017   COLONOSCOPY WITH PROPOFOL N/A 11/29/2020   Procedure: COLONOSCOPY WITH PROPOFOL;  Surgeon: Harvel Quale, MD;  [facility];  Service: Gastroenterology;  Laterality: N/A;  1:35 patient is in the [facility])   ESOPHAGOGASTRODUODENOSCOPY N/A 12/07/2019   Procedure: ESOPHAGOGASTRODUODENOSCOPY (EGD);  Surgeon: Rogene Houston, MD;  [facility];  Service: Endoscopy;  Laterality: N/A;  155   GALLBLADDER SURGERY     right breast cystectomy  1988   TUBAL LIGATION  1975    Social History   Socioeconomic History   Marital status: Divorced    Spouse name: Not on file   Number of children: 4   Years of education: 9   Highest education level: Not on file  Occupational History   Occupation: retired   Tobacco Use   Smoking status: Former    Packs/day: 1.00    Years: 40.00    Additional pack years: 0.00    Total pack years: 40.00    Types: Cigarettes    Quit date: 04/03/2011    Years since quitting: 11.4  Smokeless tobacco: Never  Vaping Use   Vaping Use: Never used  Substance and Sexual Activity   Alcohol use: No   Drug use: No   Sexual activity: Not Currently  Other Topics Concern   Not on file  Social History Narrative   01/17/20 lives at [facility], [place]   Social Determinants of Health   Financial Resource Strain: Not on file  Food Insecurity: Not on file  Transportation Needs: Not on file  Physical Activity: Not on file  Stress: Not on file  Social Connections: Not on file  Intimate Partner Violence: Not on file   Family History  Problem Relation Age of Onset   Diabetes Mother    Hyperlipidemia Mother    Hypertension Mother    Heart disease Father    Hyperlipidemia Father    Diabetes Sister    Hyperlipidemia  Sister    Hypertension Sister    Cancer Brother    Heart disease Brother    Hyperlipidemia Brother    Hypertension Brother    Diabetes Sister       VITAL SIGNS BP 111/64   Pulse 62   Temp (!) 97.4 F (36.3 C)   Resp 16   Ht 5\' 4"  (1.626 m)   Wt 113 lb 8 oz (51.5 kg)   SpO2 96%   BMI 19.48 kg/m   Outpatient Encounter Medications as of 09/28/2022  Medication Sig   acetaminophen (TYLENOL) 325 MG tablet Take 2 tablets (650 mg total) by mouth every 6 (six) hours as needed for mild pain (or Fever >/= 101).   amLODipine (NORVASC) 5 MG tablet Take 5 mg by mouth daily.   aspirin EC 81 MG tablet Take 81 mg by mouth daily. Swallow whole.   cyanocobalamin 1000 MCG tablet Take 1,000 mcg by mouth daily.   dorzolamide (TRUSOPT) 2 % ophthalmic solution Place 1 drop into both eyes 2 (two) times daily. (0900 & 1700)   ergocalciferol (VITAMIN D2) 1.25 MG (50000 UT) capsule Take 50,000 Units by mouth once a week. Thursday   folic acid (FOLVITE) 1 MG tablet Take 1 mg by mouth daily. (0900)   gabapentin (NEURONTIN) 100 MG capsule Take 100 mg by mouth 2 (two) times daily. (2100)   Iron, Ferrous Sulfate, 325 (65 Fe) MG TABS Take 1 tablet by mouth daily.   losartan (COZAAR) 25 MG tablet Take 25 mg by mouth daily.   metoCLOPramide (REGLAN) 5 MG tablet Take 1 tablet (5 mg total) by mouth 3 (three) times daily before meals.   Netarsudil-Latanoprost (ROCKLATAN) 0.02-0.005 % SOLN Apply 1 drop to eye every evening.   NON FORMULARY Diet:Regular thin liquids  ALLERGIC TO FISH   omeprazole (PRILOSEC) 40 MG capsule Take 40 mg by mouth daily at 6 (six) AM. (0600)   rosuvastatin (CRESTOR) 10 MG tablet Take 10 mg by mouth daily.   senna-docusate (SENOKOT S) 8.6-50 MG tablet Take 1 tablet by mouth 2 (two) times daily.   sodium chloride 1 g tablet Take 1 g by mouth 2 (two) times daily with a meal.   No facility-administered encounter medications on file as of 09/28/2022.     SIGNIFICANT DIAGNOSTIC  EXAMS   PREVIOUS  05-24-20: dexa: t score -2.387  NO NEW EXAMS.     LABS REVIEWED PREVIOUS     09-22-21: wbc 6.3; hgb 9.2; hct 29.4; mcv 94.8 plt 182; glucose 91; bun 32; creat 1.43; k 4.1; na++ 133; ca 9.0; GFR 38; phos 4.7; albumin 3.9 10-13-21: urine  micro-albumin 12.8 11-10-21: chol 149; ldl 83; trig 80 hdl 50 01-01-22: hgb a1c 5.4 01-14-22: wbc 5.8; hgb 10.2; hct 30.8; mc 89.8 plt 1409; glucose 101; bun 17; creat 1.14; k+ 4.4; na++ 128; ca 8.8; gfr 50; phos 3.5 albumin 3.7  01-22-22: glucose 78; bun 16; creat 1.13; k+ 4.5; na++ 134; ca 9.0; gfr 50  02-04-22: wbc 8.4; hgb 11.5; hct 36.1; mcv 93.p plt 211; glucose 121; bun 17; creat 1.21; k+ 4.7; na++ 134; ca 9.5; gfr 46; protein 8.3; albumin 4.4; vitamin B 12; 913; folate >40; iron 28; tibc 210  02-05-22: tsh 1.858; chol 117; ldl 63 trig 38 hdl 46  06-04-22: wbc 5.5; hgb 9.9; hct 31.6; mcv 92.4 plt 161; glucose 84; bun 19; creat 1.32; k+ 4.5; na++ 136; ca 9.0; gfr 42; phos 4.2; albumin 3.6; iron 33; tibc 162; ferritin 392; urine ACR: 0.19  TODAY  09-10-22: wbc 6.5; hgb 10.5; hct 33.0; mcv 93.0 plt 181; glucose 113; bun 19; creat 1.27; k+ 4.6; na++ 133; ca 9.2; gfr 43; protein 7.4 albumin 4.1; vitamin B 12: 1756; folate >40; iron 63 tibc 191  Review of Systems  Constitutional:  Negative for malaise/fatigue.  Respiratory:  Negative for cough and shortness of breath.   Cardiovascular:  Negative for chest pain, palpitations and leg swelling.  Gastrointestinal:  Negative for abdominal pain, constipation and heartburn.  Musculoskeletal:  Negative for back pain, joint pain and myalgias.  Skin: Negative.   Neurological:  Negative for dizziness.  Psychiatric/Behavioral:  The patient is not nervous/anxious.    Physical Exam Constitutional:      General: She is not in acute distress.    Appearance: She is well-developed. She is not diaphoretic.  Neck:     Thyroid: No thyromegaly.  Cardiovascular:     Rate and Rhythm: Normal rate and regular  rhythm.     Pulses: Normal pulses.     Heart sounds: Normal heart sounds.  Pulmonary:     Effort: Pulmonary effort is normal. No respiratory distress.     Breath sounds: Normal breath sounds.  Abdominal:     General: Bowel sounds are normal. There is no distension.     Palpations: Abdomen is soft.     Tenderness: There is no abdominal tenderness.  Musculoskeletal:        General: Normal range of motion.     Cervical back: Neck supple.     Right lower leg: No edema.     Left lower leg: No edema.  Lymphadenopathy:     Cervical: No cervical adenopathy.  Skin:    General: Skin is warm and dry.  Neurological:     Mental Status: She is alert and oriented to person, place, and time.  Psychiatric:        Mood and Affect: Mood normal.       ASSESSMENT/ PLAN:  TODAY   Vitamin D deficiency: level 41.14 will change to 1000 units daily   2. Thyroiditis: tsh 1.858 will monitor  3. Cervical myelopathy: is without change  4. Failure to thrive in adult: weight is 113 pounds; will continue to monitor   PREVIOUS    5. Aortic atherosclerosis (ct 12-23-19) will monitor   6. History of dvt in left lower extremity: (02-02-20) induced by megace is on long term asa 81 mg daily   7. Chronic obstructive pulmonary disease unspecified COPD type: will monitor   8. CKD stage 3 due to type 2 diabetes mellitus: bun 19; creat 1.27; gfr 43  9. Diabetic peripheral neuropathy associated with type 2 diabetes mellitus: is on gabapentin 200 mg twice daily   10. Other chronic gastritis without hemorrhage: will continue prilosec 40 mg daily reglan 5 mg with meals.   11. Type 2 diabetes mellitus with stage 3 chronic kidney disease without long term current use of insulin: hgb A1c 5.3; 81 mg asa; is on statin and arb  12. Hyperlipidemia associated with type 2 diabetes mellitus: ldl 63 is on crestor 10 mg daily    13. Hypertension associated with stage 3 chronic kidney disease due to type 2 diabetes  mellitus: b/p 111/64: will continue norvasc 5 mg daily and cozaar 25 mg daily   14. Anemia due to chronic renal failure treated with erythropoietin stage 3: hgb 10.5 will continue iron daily and is followed by oncology  15. Increased intraocular pressure bilateral: will continue trusopt to both eyes twice daily and rocklatan to both eyes nightly   16. Vitamin B 12 deficiency: level 1756; will stop vitamin; folate [id number]; will stop folic acid    Will check lipids   Ok Edwards NP [facility]   call [phone]

## 2022-10-01 ENCOUNTER — Other Ambulatory Visit (HOSPITAL_COMMUNITY)
Admission: RE | Admit: 2022-10-01 | Discharge: 2022-10-01 | Disposition: A | Discharge status: Home / Self Care | Payer: Medicare Other | Source: Skilled Nursing Facility | Attending: Adult Health | Admitting: Adult Health

## 2022-10-01 DIAGNOSIS — E785 Hyperlipidemia, unspecified: Secondary | ICD-10-CM | POA: Diagnosis not present

## 2022-10-01 LAB — LIPID PANEL
Cholesterol: 137 mg/dL (ref 0–200)
HDL: 46 mg/dL (ref >40)
LDL Cholesterol: 80 mg/dL (ref 0–99)
Total CHOL/HDL Ratio: 3 RATIO
Triglycerides: 53 mg/dL (ref <150)
VLDL: 11 mg/dL (ref 0–40)

## 2022-10-05 NOTE — Progress Notes (Unsigned)
Traci Parker, [place] [address]   CLINIC:  Medical Oncology/Hematology  PCP:  Gerlene Fee, NP [place] [address] [phone]   REASON FOR VISIT:  Follow-up for anemia due to CKD stage IIIb   PRIOR THERAPY: Epogen, Retacrit   CURRENT THERAPY: Observation   INTERVAL HISTORY:   [name] 77 y.o. female returns for routine follow-up of anemia.  She was last seen by Tarri Abernethy PA-C on 08/20/2021.   At today's visit, she reports feeling ***.  No recent hospitalizations, surgeries, or changes in baseline health status.  She reports good energy and denies any abnormal fatigue. *** She denies any hematemesis, hematochezia, melena, or epistaxis. *** No pica, restless legs, or headaches.  ***No chest pain, dyspnea on exertion, lightheadedness, or syncope.  *** She denies any unilateral leg swelling, pain, and erythema. ***  No shortness of breath, dyspnea on exertion, chest pain, cough, hemoptysis, and palpitations.  *** She continues to take ferrous sulfate daily with Colace.   She has ***% energy and ***% appetite. She endorses that she is maintaining a stable weight.  ASSESSMENT & PLAN:  1.  Normocytic anemia, secondary to CKD and relative iron deficiency - Etiology is from combination of CKD and relative iron deficiency. - Initial work-up included SPEP which was negative.  Stool was negative for occult blood.  Hemoglobin electrophoresis showed severe microcytosis.  She could have underlying thalassemia/sickle cell trait. - Last colonoscopy was in June 2019 in [place], 3 polyps removed. - She has a history of needing a blood transfusion in the early 2000's. - She denies any bright red blood per rectum or melena      - We took over Epogen injections for Dr. Theador Hawthorne, but patient has not required Retacrit for the past 6 months due to Hgb > 11.0 (last Retacrit was 02/11/2021), therefore Retacrit protocol discontinued. - She  was started on oral ferrous sulfate at last visit, but reported significant constipation, which was relieved by Colace     *** - Most recent labs (09/10/2022): Hgb 10.5/MCV 93.0, ferritin 429, iron saturation 33%. - PLAN: No indication for IV iron or ESA at this time.*** - Continue oral iron supplementation with Colace twice daily.*** -  RTC in 6 months for repeat labs and office visit.       2.  CKD stage IIIa/b - Ultrasound the kidneys on 08/22/2019 showed bilateral renal atrophy, severe atrophy of the right kidney. - Most recent CMP shows baseline CKD stage IIIb - PLAN: Continue follow-up with Dr. Theador Hawthorne   3.  [id number] and folic acid deficiency: - She is on B12 injections monthly at SNF - She takes daily folic acid supplement - Labs (09/10/2022) showed normal B12, folate, and methylmalonic acid*** - PLAN: Continue monthly B12 injections and daily folic acid supplement at SNF   4.  History of lower extremity DVT - Venous duplex (02/02/2020): Extensive acute appearing left femoropopliteal occlusive DVT extending into the calf veins. - Considered provoked in the setting of immobility/bedbound functional status - Venous ultrasound (03/24/2022) shows a previous left-sided femoral, popliteal, and DVT from 2021 has completely resolved - Eliquis was discontinued by her PCP, placed on aspirin and statin - No current signs or symptoms of recurrent DVT   ***  PLAN SUMMARY: >> *** >> *** >> ***    REVIEW OF SYSTEMS: ***  Review of Systems - Oncology   PHYSICAL EXAM:  ECOG PERFORMANCE STATUS: {CHL ONC ECOG  [id number] *** There were no vitals filed for this visit. There were no vitals filed for this visit. Physical Exam  PAST MEDICAL/SURGICAL HISTORY:  Past Medical History:  Diagnosis Date   Allergy    Anemia    pernicious anemia   Arthritis    Asthma    Blood transfusion without reported diagnosis    CKD (chronic kidney disease)    COPD (chronic obstructive pulmonary disease) ([place])  12/23/2019   Diabetic peripheral neuropathy associated with type 2 diabetes mellitus ([place]) 04/07/2021   DM (diabetes mellitus), type 2 with peripheral vascular complications ([place]) [id number]   Gastritis    GERD (gastroesophageal reflux disease)    Glaucoma    History of blood in urine    Hyperlipidemia    Hypertension    Neuropathy    both feet   Ocular hypertension    Osteoporosis    Stroke ([place])    Vitamin B12 deficiency anemia due to intrinsic factor deficiency    Past Surgical History:  Procedure Laterality Date   CHOLECYSTECTOMY  2008   COLONOSCOPY  04/30/2011   Procedure: COLONOSCOPY;  Surgeon: Rogene Houston, MD;  [facility];  Service: Endoscopy;  Laterality: N/A;   COLONOSCOPY  05/25/2012   Procedure: COLONOSCOPY;  Surgeon: Rogene Houston, MD;  [facility];  Service: Endoscopy;  Laterality: N/A;  1:25-changed to 1200 Ann to notify pt   COLONOSCOPY Bilateral 12/2017   COLONOSCOPY WITH PROPOFOL N/A 11/29/2020   Procedure: COLONOSCOPY WITH PROPOFOL;  Surgeon: Harvel Quale, MD;  [facility]: [facility];  Service: Gastroenterology;  Laterality: N/A;  1:35 patient is in the [facility]   ESOPHAGOGASTRODUODENOSCOPY N/A 12/07/2019   Procedure: ESOPHAGOGASTRODUODENOSCOPY (EGD);  Surgeon: Rogene Houston, MD;  [facility];  Service: Endoscopy;  Laterality: N/A;  155   GALLBLADDER SURGERY     right breast cystectomy  1988   TUBAL LIGATION  1975    SOCIAL HISTORY:  Social History   Socioeconomic History   Marital status: Divorced    Spouse name: Not on file   Number of children: 4   Years of education: 9   Highest education level: Not on file  Occupational History   Occupation: retired   Tobacco Use   Smoking status: Former    Packs/day: 1.00    Years: 40.00    Additional pack years: 0.00    Total pack years: 40.00    Types: Cigarettes    Quit date: 04/03/2011    Years since quitting: 11.5   Smokeless tobacco:  Never  Vaping Use   Vaping Use: Never used  Substance and Sexual Activity   Alcohol use: No   Drug use: No   Sexual activity: Not Currently  Other Topics Concern   Not on file  Social History Narrative   01/17/20 lives at [facility], [place]    Social Determinants of Health   Financial Resource Strain: Not on file  Food Insecurity: Not on file  Transportation Needs: Not on file  Physical Activity: Not on file  Stress: Not on file  Social Connections: Not on file  Intimate Partner Violence: Not on file    FAMILY HISTORY:  Family History  Problem Relation Age of Onset   Diabetes Mother    Hyperlipidemia Mother    Hypertension Mother    Heart disease Father    Hyperlipidemia Father    Diabetes Sister    Hyperlipidemia Sister    Hypertension Sister  Cancer Brother    Heart disease Brother    Hyperlipidemia Brother    Hypertension Brother    Diabetes Sister     CURRENT MEDICATIONS:  Outpatient Encounter Medications as of 10/06/2022  Medication Sig   acetaminophen (TYLENOL) 325 MG tablet Take 2 tablets (650 mg total) by mouth every 6 (six) hours as needed for mild pain (or Fever >/= 101).   amLODipine (NORVASC) 5 MG tablet Take 5 mg by mouth daily.   aspirin EC 81 MG tablet Take 81 mg by mouth daily. Swallow whole.   cholecalciferol (VITAMIN D3) 25 MCG (1000 UNIT) tablet Take 1,000 Units by mouth daily.   dorzolamide (TRUSOPT) 2 % ophthalmic solution Place 1 drop into both eyes 2 (two) times daily. (0900 & 1700)   gabapentin (NEURONTIN) 100 MG capsule Take 100 mg by mouth 2 (two) times daily. (2100)   Iron, Ferrous Sulfate, 325 (65 Fe) MG TABS Take 1 tablet by mouth daily.   losartan (COZAAR) 25 MG tablet Take 25 mg by mouth daily.   metoCLOPramide (REGLAN) 5 MG tablet Take 1 tablet (5 mg total) by mouth 3 (three) times daily before meals.   Netarsudil-Latanoprost (ROCKLATAN) 0.02-0.005 % SOLN Apply 1 drop to eye every evening.   NON FORMULARY Diet:Regular thin  liquids  ALLERGIC TO FISH   omeprazole (PRILOSEC) 40 MG capsule Take 40 mg by mouth daily at 6 (six) AM. (0600)   rosuvastatin (CRESTOR) 10 MG tablet Take 10 mg by mouth daily.   senna-docusate (SENOKOT S) 8.6-50 MG tablet Take 1 tablet by mouth 2 (two) times daily.   sodium chloride 1 g tablet Take 1 g by mouth 2 (two) times daily with a meal.   No facility-administered encounter medications on file as of 10/06/2022.    ALLERGIES:  Allergies  Allergen Reactions   Latex Rash   Levofloxacin Nausea And Vomiting   Neomycin-Bacitracin-Polymyxin  [Bacitracin-Neomycin-Polymyxin] Rash   Other Swelling and Rash    TOMATO  SOUP-PER MAR   Prednisone Hives, Itching, Rash and Other (See Comments)   Fish Allergy     rash   Shellfish Allergy Hives   Neosporin [Neomycin-Polymyxin-Gramicidin] Itching and Rash    LABORATORY DATA:  I have reviewed the labs as listed.  CBC    Component Value Date/Time   WBC 6.5 09/10/2022 1132   RBC 3.55 (L) 09/10/2022 1132   HGB 10.5 (L) 09/10/2022 1132   HCT 33.0 (L) 09/10/2022 1132   PLT 181 09/10/2022 1132   MCV 93.0 09/10/2022 1132   MCH 29.6 09/10/2022 1132   MCHC 31.8 09/10/2022 1132   RDW 13.3 09/10/2022 1132   LYMPHSABS 2.0 09/10/2022 1132   MONOABS 0.4 09/10/2022 1132   EOSABS 0.4 09/10/2022 1132   BASOSABS 0.0 09/10/2022 1132      Latest Ref Rng & Units 09/10/2022   11:32 AM 07/11/2022    7:33 AM 06/04/2022    9:22 AM  CMP  Glucose 70 - 99 mg/dL 113  95  84   BUN 8 - 23 mg/dL 19  22  19    Creatinine 0.44 - 1.00 mg/dL 1.27  1.26  1.32   Sodium 135 - 145 mmol/L 133  137  136   Potassium 3.5 - 5.1 mmol/L 4.6  4.5  4.5   Chloride 98 - 111 mmol/L 100  104  104   CO2 22 - 32 mmol/L 25  25  27    Calcium 8.9 - 10.3 mg/dL 9.2  9.1  9.0  Total Protein 6.5 - 8.1 g/dL 7.4     Total Bilirubin 0.3 - 1.2 mg/dL 0.5     Alkaline Phos 38 - 126 U/L 79     AST 15 - 41 U/L 12     ALT 0 - 44 U/L 12       DIAGNOSTIC IMAGING:  I have independently  reviewed the relevant imaging and discussed with the patient.   WRAP UP:  All questions were answered. The patient knows to call the clinic with any problems, questions or concerns.  Medical decision making: ***  Time spent on visit: I spent *** minutes counseling the patient face to face. The total time spent in the appointment was *** minutes and more than 50% was on counseling.  Harriett Rush, PA-C  ***

## 2022-10-06 ENCOUNTER — Other Ambulatory Visit: Payer: Self-pay

## 2022-10-06 ENCOUNTER — Inpatient Hospital Stay: Payer: Medicare Other | Attending: Hematology | Admitting: Physician Assistant

## 2022-10-06 VITALS — BP 148/87 | HR 80 | Temp 97.2°F | Resp 18 | Ht 64.5 in | Wt 114.0 lb

## 2022-10-06 DIAGNOSIS — Z7982 Long term (current) use of aspirin: Secondary | ICD-10-CM | POA: Diagnosis not present

## 2022-10-06 DIAGNOSIS — Z86718 Personal history of other venous thrombosis and embolism: Secondary | ICD-10-CM | POA: Diagnosis not present

## 2022-10-06 DIAGNOSIS — Z7901 Long term (current) use of anticoagulants: Secondary | ICD-10-CM | POA: Diagnosis not present

## 2022-10-06 DIAGNOSIS — N1832 Chronic kidney disease, stage 3b: Secondary | ICD-10-CM | POA: Insufficient documentation

## 2022-10-06 DIAGNOSIS — E860 Dehydration: Secondary | ICD-10-CM

## 2022-10-06 DIAGNOSIS — E538 Deficiency of other specified B group vitamins: Secondary | ICD-10-CM | POA: Insufficient documentation

## 2022-10-06 DIAGNOSIS — Z87891 Personal history of nicotine dependence: Secondary | ICD-10-CM | POA: Insufficient documentation

## 2022-10-06 DIAGNOSIS — Z79899 Other long term (current) drug therapy: Secondary | ICD-10-CM | POA: Diagnosis not present

## 2022-10-06 DIAGNOSIS — D631 Anemia in chronic kidney disease: Secondary | ICD-10-CM

## 2022-10-06 DIAGNOSIS — N189 Chronic kidney disease, unspecified: Secondary | ICD-10-CM

## 2022-10-06 NOTE — Patient Instructions (Signed)
[place] at [facility] Discharge Instructions  You were seen today by Tarri Abernethy PA-C for your history of anemia.  Your blood counts look good today!  We will check your blood and see you back for another visit in 6 months.  MEDICATIONS: - You can decrease your folate, vitamin B12, and iron supplements so that you are taking them EVERY OTHER DAY (instead of daily)  FOLLOW-UP APPOINTMENT: Office visit in 6 months   Thank you for choosing [facility] to provide your oncology and hematology care.  To afford each patient quality time with our provider, please arrive at least 15 minutes before your scheduled appointment time.   If you have a lab appointment with the [facility] please come in thru the Main Entrance and check in at the main information desk.  You need to re-schedule your appointment should you arrive 10 or more minutes late.  We strive to give you quality time with our providers, and arriving late affects you and other patients whose appointments are after yours.  Also, if you no show three or more times for appointments you may be dismissed from the clinic at the providers discretion.     Again, thank you for choosing [facility]  Our hope is that these requests will decrease the amount of time that you wait before being seen by our physicians.       _____________________________________________________________  Should you have questions after your visit to [facility], please contact our office at [phone] and follow the prompts.  Our office hours are 8:00 a.m. and 4:30 p.m. Monday - Friday.  Please note that voicemails left after 4:00 p.m. may not be returned until the following business day.  We are closed weekends and major holidays.  You do have access to a nurse 24-7, just call the main number to the clinic [phone] and do not press any options, hold on the line and a  nurse will answer the phone.    For prescription refill requests, have your pharmacy contact our office and allow 72 hours.    Due to Covid, you will need to wear a mask upon entering the hospital. If you do not have a mask, a mask will be given to you at the Main Entrance upon arrival. For doctor visits, patients may have 1 support person age 79 or older with them. For treatment visits, patients can not have anyone with them due to social distancing guidelines and our immunocompromised population.

## 2022-10-08 DIAGNOSIS — H401132 Primary open-angle glaucoma, bilateral, moderate stage: Secondary | ICD-10-CM | POA: Diagnosis not present

## 2022-10-08 DIAGNOSIS — H25813 Combined forms of age-related cataract, bilateral: Secondary | ICD-10-CM | POA: Diagnosis not present

## 2022-10-20 ENCOUNTER — Encounter: Payer: Self-pay | Admitting: Internal Medicine

## 2022-10-20 ENCOUNTER — Non-Acute Institutional Stay (SKILLED_NURSING_FACILITY): Payer: Medicare Other | Admitting: Internal Medicine

## 2022-10-20 DIAGNOSIS — E1169 Type 2 diabetes mellitus with other specified complication: Secondary | ICD-10-CM

## 2022-10-20 DIAGNOSIS — D509 Iron deficiency anemia, unspecified: Secondary | ICD-10-CM

## 2022-10-20 DIAGNOSIS — E538 Deficiency of other specified B group vitamins: Secondary | ICD-10-CM | POA: Diagnosis not present

## 2022-10-20 DIAGNOSIS — E785 Hyperlipidemia, unspecified: Secondary | ICD-10-CM | POA: Diagnosis not present

## 2022-10-20 DIAGNOSIS — E1122 Type 2 diabetes mellitus with diabetic chronic kidney disease: Secondary | ICD-10-CM

## 2022-10-20 DIAGNOSIS — N183 Chronic kidney disease, stage 3 unspecified: Secondary | ICD-10-CM

## 2022-10-20 DIAGNOSIS — R6889 Other general symptoms and signs: Secondary | ICD-10-CM

## 2022-10-20 NOTE — Patient Instructions (Signed)
See assessment and plan under each diagnosis in the problem list and acutely for this visit 

## 2022-10-20 NOTE — Assessment & Plan Note (Signed)
This symptom persists; the most recent TSH was therapeutic.  This will be updated in the next 3 months.  She denies any Raynaud's phenomena.

## 2022-10-20 NOTE — Assessment & Plan Note (Signed)
B12 level is supratherapeutic at 1756.  B12 supplementation had been D/Ced.

## 2022-10-20 NOTE — Progress Notes (Unsigned)
NURSING HOME LOCATION:  [facility] ROOM NUMBER:  150 P  CODE STATUS:  DNR  PCP:  Synthia Innocent NP  This is a nursing facility follow up visit of chronic medical diagnoses & to document compliance with Regulation 483.30 (c) in The Long Term Care Survey Manual Phase 2 which mandates caregiver visit ( visits can alternate among physician, PA or NP as per statutes) within 10 days of 30 days / 60 days/ 90 days post admission to SNF date    Interim medical record and care since last SNF visit was updated with review of diagnostic studies and change in clinical status since last visit were documented.  HPI: She is a permanent resident of this facility with diagnoses of chronic anemia, history of asthma, COPD, diabetes complicated by peripheral neuropathy and CKD, history of gastritis, GERD, dyslipidemia, essential hypertension, history of B12 deficiency, and history of stroke.  Lipid profile was current as of 10/01/2022 and revealed an LDL minimally above goal with a value of 80.  Serially anemia is stable to slightly improved with most recent values of 10.5/33 with normochromic, normocytic indices.  Iron panel revealed normal iron level and elevated ferritin level.  B12 was 1756.  On 08/2922 creatinine was 1.27 with a GFR 43 indicating high stage IIIb.  Serially this is stable to slightly improved.  Review of systems: She states that she is "doing good."  She stated that she was seen at the [facility] and was told to return in 6 months. That OV 10/06/22 was reviewed. She is to have cataract surgery in May.  Her only positive symptoms include "tingling" of the toes but not the feet or lower extremities otherwise.  She continues to have cold intolerance.  Constitutional: No fever, significant weight change, fatigue  Eyes: No redness, discharge, pain, vision change ENT/mouth: No nasal congestion,  purulent discharge, earache, change in hearing, sore throat  Cardiovascular: No chest  pain, palpitations, paroxysmal nocturnal dyspnea, claudication, edema  Respiratory: No cough, sputum production, hemoptysis, DOE, significant snoring, apnea   Gastrointestinal: No heartburn, dysphagia, abdominal pain, nausea /vomiting, rectal bleeding, melena, change in bowels Genitourinary: No dysuria, hematuria, pyuria, incontinence, nocturia Musculoskeletal: No joint stiffness, joint swelling, weakness, pain Dermatologic: No rash, pruritus, change in appearance of skin Neurologic: No dizziness, headache, syncope, seizures Psychiatric: No significant anxiety, depression, insomnia, anorexia Endocrine: No change in hair/skin/nails, excessive thirst, excessive hunger, excessive urination  Hematologic/lymphatic: No significant bruising, lymphadenopathy, abnormal bleeding Allergy/immunology: No itchy/watery eyes, significant sneezing, urticaria, angioedema  Physical exam:  Pertinent or positive findings: She is thin and appears suboptimally nourished.  She wears wool gloves and booties despite being in the bed under covers.  The temperature in the room was noticeably warm.  The right lacrimal gland is prominent.  She has arcus senilis.  The lower lids are puffy.  She is edentulous.  There is slight increase in S2.  Breath sounds are decreased.  Pedal pulses are decreased to palpation.  Limb atrophy is present but she is strong to opposition.  General appearance:  no acute distress, increased work of breathing is present.   Lymphatic: No lymphadenopathy about the head, neck, axilla. Eyes: No conjunctival inflammation or lid edema is present. There is no scleral icterus. Ears:  External ear exam shows no significant lesions or deformities.   Nose:  External nasal examination shows no deformity or inflammation. Nasal mucosa are pink and moist without lesions, exudates Oral exam:  Lips and gums are healthy appearing.  There is no oropharyngeal erythema or exudate. Neck:  No thyromegaly, masses,  tenderness noted.    Heart:  Normal rate and regular rhythm. S1 normal without gallop, murmur, click, rub .  Lungs:  without wheezes, rhonchi, rales, rubs. Abdomen: Bowel sounds are normal. Abdomen is soft and nontender with no organomegaly, hernias, masses. GU: Deferred  Extremities:  No cyanosis, clubbing, edema  Neurologic exam :Balance, Rhomberg, finger to nose testing could not be completed due to clinical state Skin: Warm & dry w/o tenting. No significant lesions or rash.  See summary under each active problem in the Problem List with associated updated therapeutic plan

## 2022-10-20 NOTE — Assessment & Plan Note (Signed)
Hematology has recommended follow-up in 6 months.  Current H/H is 10.5/33 with normochromic, normocytic indices.  Iron panel revealed normal iron level and elevated ferritin level.

## 2022-10-20 NOTE — Assessment & Plan Note (Signed)
LDL is slightly above goal of less than 70 in the context of history of stroke on 10 mg of Crestor.  NP and I will discuss possibly increasing the dose of statin.

## 2022-10-21 NOTE — Assessment & Plan Note (Addendum)
CKD stable to improved. Current creatinine 1.27, GFR 43. No change in meds indicated.

## 2022-11-02 ENCOUNTER — Other Ambulatory Visit (HOSPITAL_COMMUNITY)
Admission: RE | Admit: 2022-11-02 | Discharge: 2022-11-02 | Disposition: A | Discharge status: Home / Self Care | Payer: Medicare Other | Source: Skilled Nursing Facility | Attending: Adult Health | Admitting: Adult Health

## 2022-11-02 DIAGNOSIS — I1310 Hypertensive heart and chronic kidney disease without heart failure, with stage 1 through stage 4 chronic kidney disease, or unspecified chronic kidney disease: Secondary | ICD-10-CM | POA: Diagnosis not present

## 2022-11-02 LAB — COMPREHENSIVE METABOLIC PANEL
ALT: 7 U/L (ref 0–44)
AST: 7 U/L — ABNORMAL LOW (ref 15–41)
Albumin: 3.3 g/dL — ABNORMAL LOW (ref 3.5–5.0)
Alkaline Phosphatase: 61 U/L (ref 38–126)
Anion gap: 8 (ref 5–15)
BUN: 19 mg/dL (ref 8–23)
CO2: 25 mmol/L (ref 22–32)
Calcium: 8.7 mg/dL — ABNORMAL LOW (ref 8.9–10.3)
Chloride: 102 mmol/L (ref 98–111)
Creatinine, Ser: 1.44 mg/dL — ABNORMAL HIGH (ref 0.44–1.00)
GFR, Estimated: 37 mL/min — ABNORMAL LOW (ref >60)
Glucose, Bld: 78 mg/dL (ref 70–99)
Potassium: 3.9 mmol/L (ref 3.5–5.1)
Sodium: 135 mmol/L (ref 135–145)
Total Bilirubin: 0.6 mg/dL (ref 0.3–1.2)
Total Protein: 6 g/dL — ABNORMAL LOW (ref 6.5–8.1)

## 2022-11-02 LAB — CBC
HCT: 28.8 % — ABNORMAL LOW (ref 36.0–46.0)
Hemoglobin: 9.1 g/dL — ABNORMAL LOW (ref 12.0–15.0)
MCH: 29.7 pg (ref 26.0–34.0)
MCHC: 31.6 g/dL (ref 30.0–36.0)
MCV: 94.1 fL (ref 80.0–100.0)
Platelets: 136 10*3/uL — ABNORMAL LOW (ref 150–400)
RBC: 3.06 MIL/uL — ABNORMAL LOW (ref 3.87–5.11)
RDW: 13.6 % (ref 11.5–15.5)
WBC: 6.3 10*3/uL (ref 4.0–10.5)
nRBC: 0 % (ref 0.0–0.2)

## 2022-11-03 ENCOUNTER — Encounter: Payer: Self-pay | Admitting: Adult Health

## 2022-11-03 ENCOUNTER — Non-Acute Institutional Stay (SKILLED_NURSING_FACILITY): Payer: Medicare Other | Admitting: Adult Health

## 2022-11-03 DIAGNOSIS — D509 Iron deficiency anemia, unspecified: Secondary | ICD-10-CM

## 2022-11-03 DIAGNOSIS — E441 Mild protein-calorie malnutrition: Secondary | ICD-10-CM

## 2022-11-03 NOTE — Progress Notes (Signed)
Location:  [facility] Room Number: 150-P Place of Service:  SNF (31) Provider: Synthia Innocent, NP  CODE STATUS: DNR  Allergies  Allergen Reactions   Latex Rash   Levofloxacin Nausea And Vomiting   Neomycin-Bacitracin-Polymyxin  [Bacitracin-Neomycin-Polymyxin] Rash   Other Swelling and Rash    TOMATO  SOUP-PER MAR   Prednisone Hives, Itching, Rash and Other (See Comments)   Fish Allergy     rash   Shellfish Allergy Hives   Neosporin [Neomycin-Polymyxin-Gramicidin] Itching and Rash    Chief Complaint  Patient presents with   Acute Visit    Labs follow up    HPI:  Her albumin is 3.3.  she has had low plt level three times in the past year. There are no reports of changes in her appetite; her weight is stable. No reports of bleeding present.   Past Medical History:  Diagnosis Date   Allergy    Anemia    pernicious anemia   Arthritis    Asthma    Blood transfusion without reported diagnosis    CKD (chronic kidney disease)    COPD (chronic obstructive pulmonary disease) 12/23/2019   Diabetic peripheral neuropathy associated with type 2 diabetes mellitus 04/07/2021   DM (diabetes mellitus), type 2 with peripheral vascular complications 12/23/2019   Gastritis    GERD (gastroesophageal reflux disease)    Glaucoma    History of blood in urine    Hyperlipidemia    Hypertension    Neuropathy    both feet   Ocular hypertension    Osteoporosis    Stroke    Vitamin B12 deficiency anemia due to intrinsic factor deficiency     Past Surgical History:  Procedure Laterality Date   CHOLECYSTECTOMY  2008   COLONOSCOPY  04/30/2011   Procedure: COLONOSCOPY;  Surgeon: Malissa Hippo, MD;  [facility];  Service: Endoscopy;  Laterality: N/A;   COLONOSCOPY  05/25/2012   Procedure: COLONOSCOPY;  Surgeon: Malissa Hippo, MD;  [facility];  Service: Endoscopy;  Laterality: N/A;  1:25-changed to 1200 Ann to notify pt   COLONOSCOPY  Bilateral 12/2017   COLONOSCOPY WITH PROPOFOL N/A 11/29/2020   Procedure: COLONOSCOPY WITH PROPOFOL;  Surgeon: Dolores Frame, MD;  [facility];  Service: Gastroenterology;  Laterality: N/A;  1:35 [facility] is in the [facility]   ESOPHAGOGASTRODUODENOSCOPY N/A 12/07/2019   Procedure: ESOPHAGOGASTRODUODENOSCOPY (EGD);  Surgeon: Malissa Hippo, MD;  [facility];  Service: Endoscopy;  Laterality: N/A;  155   GALLBLADDER SURGERY     right breast cystectomy  1988   TUBAL LIGATION  1975    Social History   Socioeconomic History   Marital status: Divorced    Spouse name: Not on file   Number of children: 4   Years of education: 9   Highest education level: Not on file  Occupational History   Occupation: retired   Tobacco Use   Smoking status: Former    Packs/day: 1.00    Years: 40.00    Additional pack years: 0.00    Total pack years: 40.00    Types: Cigarettes    Quit date: 04/03/2011    Years since quitting: 11.5   Smokeless tobacco: Never  Vaping Use   Vaping Use: Never used  Substance and Sexual Activity   Alcohol use: No   Drug use: No   Sexual activity: Not Currently  Other Topics Concern   Not on file  Social  History Narrative   01/17/[birth year] lives at [facility], [place]   Social Determinants of Health   Financial Resource Strain: Not on file  Food Insecurity: Not on file  Transportation Needs: Not on file  Physical Activity: Not on file  Stress: Not on file  Social Connections: Not on file  Intimate Partner Violence: Not on file   Family History  Problem Relation Age of Onset   Diabetes Mother    Hyperlipidemia Mother    Hypertension Mother    Heart disease Father    Hyperlipidemia Father    Diabetes Sister    Hyperlipidemia Sister    Hypertension Sister    Cancer Brother    Heart disease Brother    Hyperlipidemia Brother    Hypertension Brother    Diabetes Sister       VITAL SIGNS BP 136/74   Pulse 96   Temp  98 F (36.7 C)   Resp 20   Ht 5\' 4"  (1.626 m)   Wt 114 lb 6.4 oz (51.9 kg)   SpO2 99%   BMI 19.64 kg/m   Outpatient Encounter Medications as of 11/03/2022  Medication Sig   amLODipine (NORVASC) 5 MG tablet Take 5 mg by mouth daily.   aspirin EC 81 MG tablet Take 81 mg by mouth daily. Swallow whole.   Calcium Carb-Cholecalciferol (CALCIUM 500 + D) 500-5 MG-MCG TABS Take 1 tablet by mouth daily.   dorzolamide (TRUSOPT) 2 % ophthalmic solution Place 1 drop into both eyes 2 (two) times daily. (0900 & 1700)   folic acid (FOLVITE) 1 MG tablet Take 1 mg by mouth every other day.   gabapentin (NEURONTIN) 100 MG capsule Take 200 mg by mouth at bedtime. (2100)   gabapentin (NEURONTIN) 100 MG capsule Take 100 mg by mouth daily.   Iron, Ferrous Sulfate, 325 (65 Fe) MG TABS Take 1 tablet by mouth daily.   losartan (COZAAR) 25 MG tablet Take 25 mg by mouth daily.   metoCLOPramide (REGLAN) 5 MG tablet Take 1 tablet (5 mg total) by mouth 3 (three) times daily before meals.   Netarsudil-Latanoprost (ROCKLATAN) 0.02-0.005 % SOLN Apply 1 drop to eye every evening.   NON FORMULARY Diet:Regular thin liquids  ALLERGIC TO FISH   omeprazole (PRILOSEC) 40 MG capsule Take 40 mg by mouth daily at 6 (six) AM. (0600)   rosuvastatin (CRESTOR) 20 MG tablet Take 20 mg by mouth daily.   senna-docusate (SENOKOT S) 8.6-50 MG tablet Take 1 tablet by mouth 2 (two) times daily.   sodium chloride 1 g tablet Take 1 g by mouth 2 (two) times daily with a meal.   [DISCONTINUED] acetaminophen (TYLENOL) 325 MG tablet Take 2 tablets (650 mg total) by mouth every 6 (six) hours as needed for mild pain (or Fever >/= 101).   [DISCONTINUED] cholecalciferol (VITAMIN D3) 25 MCG (1000 UNIT) tablet Take 1,000 Units by mouth daily.   [DISCONTINUED] rosuvastatin (CRESTOR) 10 MG tablet Take 10 mg by mouth daily.   No facility-administered encounter medications on file as of 11/03/2022.     SIGNIFICANT DIAGNOSTIC  EXAMS  PREVIOUS  05-24-20: dexa: t score -2.387  NO NEW EXAMS.     LABS REVIEWED PREVIOUS     10-13-21: urine micro-albumin 12.8 11-10-21: chol 149; ldl 83; trig 80 hdl 50 01-01-22: hgb a1c 5.4 01-14-22: wbc 5.8; hgb 10.2; hct 30.8; mc 89.8 plt 1409; glucose 101; bun 17; creat 1.14; k+ 4.4; na++ 128; ca 8.8; gfr 50; phos 3.5 albumin 3.7  01-22-22: glucose 78; bun 16; creat 1.13; k+ 4.5; na++ 134; ca 9.0; gfr 50  02-04-22: wbc 8.4; hgb 11.5; hct 36.1; mcv 93.p plt 211; glucose 121; bun 17; creat 1.21; k+ 4.7; na++ 134; ca 9.5; gfr 46; protein 8.3; albumin 4.4; vitamin B 12; 913; folate >40; iron 28; tibc 210  02-05-22: tsh 1.858; chol 117; ldl 63 trig 38 hdl 46  06-04-22: wbc 5.5; hgb 9.9; hct 31.6; mcv 92.4 plt 161; glucose 84; bun 19; creat 1.32; k+ 4.5; na++ 136; ca 9.0; gfr 42; phos 4.2; albumin 3.6; iron 33; tibc 162; ferritin 392; urine ACR: 0.19  TODAY  09-10-22: wbc 6.5; hgb 10.5; hct 33.0; mcv 93.0 plt 181; glucose 113; bun 19; creat 1.27; k+ 4.6; na++ 133; ca 9.2; gfr 43; protein 7.4 albumin 4.1; vitamin B 12: 1756; folate >40; iron 63 tibc 191 10-01-22: chol 137; ldl 80; trig 53; hdl 46 11-02-22: wbc 6.3; hgb 9.1; hct 28.8; mcv 94.1 plt 136; glucose 78; bun 19; creat 1.44; k+ 3.9; na++ 135; ca 8.7; gfr 37; protein 6.0 albumin 3.3   Review of Systems  Constitutional:  Negative for malaise/fatigue.  Respiratory:  Negative for cough and shortness of breath.   Cardiovascular:  Negative for chest pain, palpitations and leg swelling.  Gastrointestinal:  Negative for abdominal pain, constipation and heartburn.  Musculoskeletal:  Negative for back pain, joint pain and myalgias.  Skin: Negative.   Neurological:  Negative for dizziness.  Psychiatric/Behavioral:  The patient is not nervous/anxious.    Physical Exam Constitutional:      General: She is not in acute distress.    Appearance: She is well-developed. She is not diaphoretic.  Neck:     Thyroid: No thyromegaly.  Cardiovascular:      Rate and Rhythm: Normal rate and regular rhythm.     Pulses: Normal pulses.     Heart sounds: Normal heart sounds.  Pulmonary:     Effort: Pulmonary effort is normal. No respiratory distress.     Breath sounds: Normal breath sounds.  Abdominal:     General: Bowel sounds are normal. There is no distension.     Palpations: Abdomen is soft.     Tenderness: There is no abdominal tenderness.  Musculoskeletal:        General: Normal range of motion.     Cervical back: Neck supple.     Right lower leg: No edema.     Left lower leg: No edema.  Lymphadenopathy:     Cervical: No cervical adenopathy.  Skin:    General: Skin is warm and dry.  Neurological:     Mental Status: She is alert and oriented to person, place, and time.  Psychiatric:        Mood and Affect: Mood normal.        ASSESSMENT/ PLAN:  TODAY  Protein calorie malnutrition mild: albumin 3.3 will begin prostat 30 mL daily Microcytic anemia: is worse will repeat hgb/hct will repeat labs  Will check cbc; hgb A1c vitamin B12; d; folate      Synthia Innocent NP [facility]   call [phone]

## 2022-11-05 ENCOUNTER — Other Ambulatory Visit (HOSPITAL_COMMUNITY)
Admission: RE | Admit: 2022-11-05 | Discharge: 2022-11-05 | Disposition: A | Discharge status: Home / Self Care | Payer: Medicare Other | Source: Skilled Nursing Facility | Attending: Internal Medicine | Admitting: Internal Medicine

## 2022-11-05 DIAGNOSIS — E1122 Type 2 diabetes mellitus with diabetic chronic kidney disease: Secondary | ICD-10-CM | POA: Insufficient documentation

## 2022-11-05 DIAGNOSIS — I1310 Hypertensive heart and chronic kidney disease without heart failure, with stage 1 through stage 4 chronic kidney disease, or unspecified chronic kidney disease: Secondary | ICD-10-CM | POA: Insufficient documentation

## 2022-11-05 LAB — PROTEIN / CREATININE RATIO, URINE
Creatinine, Urine: 46 mg/dL
Protein Creatinine Ratio: 0.17 mg/mg{Cre} — ABNORMAL HIGH (ref 0.00–0.15)
Total Protein, Urine: 8 mg/dL

## 2022-11-06 ENCOUNTER — Other Ambulatory Visit (HOSPITAL_COMMUNITY)
Admission: RE | Admit: 2022-11-06 | Discharge: 2022-11-06 | Disposition: A | Discharge status: Home / Self Care | Payer: Medicare Other | Source: Skilled Nursing Facility | Attending: Adult Health | Admitting: Adult Health

## 2022-11-06 DIAGNOSIS — N1832 Chronic kidney disease, stage 3b: Secondary | ICD-10-CM | POA: Insufficient documentation

## 2022-11-09 DIAGNOSIS — N189 Chronic kidney disease, unspecified: Secondary | ICD-10-CM | POA: Diagnosis not present

## 2022-11-09 DIAGNOSIS — E1122 Type 2 diabetes mellitus with diabetic chronic kidney disease: Secondary | ICD-10-CM | POA: Diagnosis not present

## 2022-11-09 DIAGNOSIS — I129 Hypertensive chronic kidney disease with stage 1 through stage 4 chronic kidney disease, or unspecified chronic kidney disease: Secondary | ICD-10-CM | POA: Diagnosis not present

## 2022-11-09 LAB — PTH, INTACT AND CALCIUM
Calcium, Total (PTH): 9.4 mg/dL (ref 8.7–10.3)
PTH: 25 pg/mL (ref 15–65)

## 2022-11-11 ENCOUNTER — Other Ambulatory Visit (HOSPITAL_COMMUNITY)
Admission: RE | Admit: 2022-11-11 | Discharge: 2022-11-11 | Disposition: A | Discharge status: Home / Self Care | Payer: Medicare Other | Source: Skilled Nursing Facility | Attending: Adult Health | Admitting: Adult Health

## 2022-11-11 DIAGNOSIS — D509 Iron deficiency anemia, unspecified: Secondary | ICD-10-CM | POA: Diagnosis not present

## 2022-11-11 LAB — FOLATE: Folate: 32.8 ng/mL (ref >5.9)

## 2022-11-11 LAB — IRON AND TIBC
Iron: 30 ug/dL (ref 28–170)
Saturation Ratios: 17 % (ref 10.4–31.8)
TIBC: 172 ug/dL — ABNORMAL LOW (ref 250–450)
UIBC: 142 ug/dL

## 2022-11-11 LAB — FERRITIN: Ferritin: 452 ng/mL — ABNORMAL HIGH (ref 11–307)

## 2022-11-11 LAB — VITAMIN B12: Vitamin B-12: 459 pg/mL (ref 180–914)

## 2022-11-12 ENCOUNTER — Other Ambulatory Visit (HOSPITAL_COMMUNITY)
Admission: RE | Admit: 2022-11-12 | Discharge: 2022-11-12 | Disposition: A | Discharge status: Home / Self Care | Payer: Medicare Other | Source: Skilled Nursing Facility | Attending: Adult Health | Admitting: Adult Health

## 2022-11-12 DIAGNOSIS — D509 Iron deficiency anemia, unspecified: Secondary | ICD-10-CM | POA: Insufficient documentation

## 2022-11-12 LAB — HEMOGLOBIN AND HEMATOCRIT, BLOOD
HCT: 27.8 % — ABNORMAL LOW (ref 36.0–46.0)
Hemoglobin: 8.7 g/dL — ABNORMAL LOW (ref 12.0–15.0)

## 2022-11-14 LAB — METHYLMALONIC ACID, SERUM: Methylmalonic Acid, Quantitative: 176 nmol/L (ref 0–378)

## 2022-11-16 LAB — SEROTONIN SERUM: Serotonin, Serum: 78 ng/mL (ref 8–217)

## 2022-11-16 NOTE — Progress Notes (Unsigned)
[facility] [address] [place], [place] [address]   CLINIC:  Medical Oncology/Hematology  PCP:  Sharee Holster, NP  [address] [place] [address] [phone]   REASON FOR VISIT:  Follow-up for anemia due to CKD stage IIIb   PRIOR THERAPY: Epogen, Retacrit   CURRENT THERAPY: Observation   INTERVAL HISTORY:   Traci Parker 77 y.o. female returns for routine follow-up of anemia.  She was last seen by Rojelio Brenner PA-C on 10/06/2022.  However, since her last visit, she was noted to have a drop in hemoglobin and her nephrologist (Dr. Wolfgang Phoenix) requested earlier return to hematology clinic.   Since her last visit, she has not noticed any blood loss such as hematemesis, hematochezia, melena, or epistaxis.  *** *** She does report some increased fatigue from her baseline.  *** *** No pica, restless legs, or headaches.  No chest pain, dyspnea on exertion, lightheadedness, or syncope.     *** She continues to take ferrous sulfate, folic acid, and vitamin B12 tablet every other day.  ***?? She has ***% energy and ***% appetite. She endorses that she is maintaining a stable weight.  ASSESSMENT & PLAN:  1.  Normocytic anemia, secondary to CKD and relative iron deficiency - Etiology is from combination of CKD and relative iron deficiency. - Initial work-up included SPEP which was negative.  Stool was negative for occult blood.  Hemoglobin electrophoresis showed severe microcytosis.  She could have underlying thalassemia/sickle cell trait. - Last colonoscopy was in June 2019 in [place], 3 polyps removed. - She has a history of needing a blood transfusion in the early 2000's. - She was previously on Epogen injections, but has not required Retacrit since 02/11/2021 *** - Seen today due to acute drop in hemoglobin: Hemoglobin was 10.5 (09/10/2022), dropped to Hgb 9.1 (11/02/2022) and Hgb 8.7 (11/12/2022). - She continues to take daily iron tablet with stool softener  *** - She denies any bright red blood per rectum or melena     *** - Most recent labs (11/11/2022): Hgb 8.7, ferritin 452, iron saturation 17% with low TIBC 172.  - PLAN: Recommend IV Venofer 200 mg every other week x 3.  We will restart Retacrit at 10,000 units every other week (in between IV iron) -  RTC in 6 weeks ***   2.  CKD stage IIIa/b - Ultrasound the kidneys on 08/22/2019 showed bilateral renal atrophy, severe atrophy of the right kidney. - Most recent CMP shows baseline CKD stage IIIb - PLAN: Continue follow-up with Dr. Wolfgang Phoenix   3.  B12 and folic acid deficiency: - She previously received B12 injections monthly at SNF - She is taking B12 and folic acid oral supplementation every other day *** - Labs (11/11/2022) showed normal B12 459, normal MMA, normal folate. - PLAN: Continue vitamin B12 and folic acid supplements every other day - Will recheck levels at follow-up visit in 6 months   4.  History of lower extremity DVT - Venous duplex (02/02/2020): Extensive acute appearing left femoropopliteal occlusive DVT extending into the calf veins. - Considered provoked in the setting of immobility/bedbound functional status - Venous ultrasound (03/24/2022) shows a previous left-sided femoral, popliteal, and DVT from 2021 has completely resolved - Eliquis was discontinued by her PCP, placed on aspirin and statin - No current signs or symptoms of recurrent DVT     PLAN SUMMARY: >>***     REVIEW OF SYSTEMS:***  Review of Systems  Constitutional:  Negative  for appetite change, chills, diaphoresis, fatigue, fever and unexpected weight change.  HENT:   Negative for lump/mass and nosebleeds.   Eyes:  Negative for eye problems.  Respiratory:  Negative for cough, hemoptysis and shortness of breath.   Cardiovascular:  Negative for chest pain, leg swelling and palpitations.  Gastrointestinal:  Negative for abdominal pain, blood in stool, constipation, diarrhea, nausea and vomiting.   Genitourinary:  Negative for hematuria.   Skin: Negative.   Neurological:  Positive for numbness. Negative for dizziness, headaches and light-headedness.  Hematological:  Does not bruise/bleed easily.     PHYSICAL EXAM:***  ECOG PERFORMANCE STATUS: 3 - Symptomatic, >50% confined to bed  There were no vitals filed for this visit. There were no vitals filed for this visit. Physical Exam Constitutional:      Appearance: Normal appearance.     Comments: Presents in wheelchair  HENT:     Head: Normocephalic and atraumatic.     Mouth/Throat:     Mouth: Mucous membranes are moist.  Eyes:     Extraocular Movements: Extraocular movements intact.     Pupils: Pupils are equal, round, and reactive to light.  Cardiovascular:     Rate and Rhythm: Normal rate and regular rhythm.     Pulses: Normal pulses.     Heart sounds: Normal heart sounds.  Pulmonary:     Effort: Pulmonary effort is normal.     Breath sounds: Normal breath sounds.  Abdominal:     General: Bowel sounds are normal.     Palpations: Abdomen is soft.     Tenderness: There is no abdominal tenderness.  Musculoskeletal:        General: No swelling.     Right lower leg: No edema.     Left lower leg: No edema.  Lymphadenopathy:     Cervical: No cervical adenopathy.  Skin:    General: Skin is warm and dry.  Neurological:     General: No focal deficit present.     Mental Status: She is alert and oriented to person, place, and time.  Psychiatric:        Mood and Affect: Mood normal.        Behavior: Behavior normal.    PAST MEDICAL/SURGICAL HISTORY:  Past Medical History:  Diagnosis Date   Allergy    Anemia    pernicious anemia   Arthritis    Asthma    Blood transfusion without reported diagnosis    CKD (chronic kidney disease)    COPD (chronic obstructive pulmonary disease) (HCC) 12/23/2019   Diabetic peripheral neuropathy associated with type 2 diabetes mellitus (HCC) 04/07/2021   DM (diabetes mellitus), type  2 with peripheral vascular complications (HCC) 12/23/2019   Gastritis    GERD (gastroesophageal reflux disease)    Glaucoma    History of blood in urine    Hyperlipidemia    Hypertension    Neuropathy    both feet   Ocular hypertension    Osteoporosis    Stroke (HCC)    Vitamin B12 deficiency anemia due to intrinsic factor deficiency    Past Surgical History:  Procedure Laterality Date   CHOLECYSTECTOMY  2008   COLONOSCOPY  04/30/2011   Procedure: COLONOSCOPY;  Surgeon: Malissa Hippo, MD;  [facility];  Service: Endoscopy;  Laterality: N/A;   COLONOSCOPY  05/25/2012   Procedure: COLONOSCOPY;  Surgeon: Malissa Hippo, MD;  [facility];  Service: Endoscopy;  Laterality: N/A;  1:25-changed to 1200 Ann to  notify pt   COLONOSCOPY Bilateral 12/2017   COLONOSCOPY WITH PROPOFOL N/A 11/29/2020   Procedure: COLONOSCOPY WITH PROPOFOL;  Surgeon: Dolores Frame, MD;  [facility];  Service: Gastroenterology;  Laterality: N/A;  1:35 [facility] is in the [facility]   ESOPHAGOGASTRODUODENOSCOPY N/A 12/07/2019   Procedure: ESOPHAGOGASTRODUODENOSCOPY (EGD);  Surgeon: Malissa Hippo, MD;  [facility];  Service: Endoscopy;  Laterality: N/A;  155   GALLBLADDER SURGERY     right breast cystectomy  1988   TUBAL LIGATION  1975    SOCIAL HISTORY:  Social History   Socioeconomic History   Marital status: Divorced    Spouse name: Not on file   Number of children: 4   Years of education: 9   Highest education level: Not on file  Occupational History   Occupation: retired   Tobacco Use   Smoking status: Former    Packs/day: 1.00    Years: 40.00    Additional pack years: 0.00    Total pack years: 40.00    Types: Cigarettes    Quit date: 04/03/2011    Years since quitting: 11.6   Smokeless tobacco: Never  Vaping Use   Vaping Use: Never used  Substance and Sexual Activity   Alcohol use: No   Drug use: No   Sexual activity: Not  Currently  Other Topics Concern   Not on file  Social History Narrative   [birth year] lives at [facility],  [place]   Social Determinants of Health   Financial Resource Strain: Not on file  Food Insecurity: Not on file  Transportation Needs: Not on file  Physical Activity: Not on file  Stress: Not on file  Social Connections: Not on file  Intimate Partner Violence: Not on file    FAMILY HISTORY:  Family History  Problem Relation Age of Onset   Diabetes Mother    Hyperlipidemia Mother    Hypertension Mother    Heart disease Father    Hyperlipidemia Father    Diabetes Sister    Hyperlipidemia Sister    Hypertension Sister    Cancer Brother    Heart disease Brother    Hyperlipidemia Brother    Hypertension Brother    Diabetes Sister     CURRENT MEDICATIONS:  Outpatient Encounter Medications as of 11/17/2022  Medication Sig   amLODipine (NORVASC) 5 MG tablet Take 5 mg by mouth daily.   aspirin EC 81 MG tablet Take 81 mg by mouth daily. Swallow whole.   Calcium Carb-Cholecalciferol (CALCIUM 500 + D) 500-5 MG-MCG TABS Take 1 tablet by mouth daily.   dorzolamide (TRUSOPT) 2 % ophthalmic solution Place 1 drop into both eyes 2 (two) times daily. (0900 & 1700)   folic acid (FOLVITE) 1 MG tablet Take 1 mg by mouth every other day.   gabapentin (NEURONTIN) 100 MG capsule Take 200 mg by mouth at bedtime. (2100)   gabapentin (NEURONTIN) 100 MG capsule Take 100 mg by mouth daily.   Iron, Ferrous Sulfate, 325 (65 Fe) MG TABS Take 1 tablet by mouth daily.   losartan (COZAAR) 25 MG tablet Take 25 mg by mouth daily.   metoCLOPramide (REGLAN) 5 MG tablet Take 1 tablet (5 mg total) by mouth 3 (three) times daily before meals.   Netarsudil-Latanoprost (ROCKLATAN) 0.02-0.005 % SOLN Apply 1 drop to eye every evening.   NON FORMULARY Diet:Regular thin liquids  ALLERGIC TO FISH   omeprazole (PRILOSEC) 40 MG capsule Take 40 mg by mouth daily at 6 (  six) AM. (0600)   rosuvastatin (CRESTOR) 20 MG  tablet Take 20 mg by mouth daily.   senna-docusate (SENOKOT S) 8.6-50 MG tablet Take 1 tablet by mouth 2 (two) times daily.   sodium chloride 1 g tablet Take 1 g by mouth 2 (two) times daily with a meal.   No facility-administered encounter medications on file as of 11/17/2022.    ALLERGIES:  Allergies  Allergen Reactions   Latex Rash   Levofloxacin Nausea And Vomiting   Neomycin-Bacitracin-Polymyxin  [Bacitracin-Neomycin-Polymyxin] Rash   Other Swelling and Rash    TOMATO  SOUP-PER MAR   Prednisone Hives, Itching, Rash and Other (See Comments)   Fish Allergy     rash   Shellfish Allergy Hives   Neosporin [Neomycin-Polymyxin-Gramicidin] Itching and Rash    LABORATORY DATA:  I have reviewed the labs as listed.  CBC    Component Value Date/Time   WBC 6.3 11/02/2022 0809   RBC 3.06 (L) 11/02/2022 0809   HGB 8.7 (L) 11/12/2022 0800   HCT 27.8 (L) 11/12/2022 0800   PLT 136 (L) 11/02/2022 0809   MCV 94.1 11/02/2022 0809   MCH 29.7 11/02/2022 0809   MCHC 31.6 11/02/2022 0809   RDW 13.6 11/02/2022 0809   LYMPHSABS 2.0 09/10/2022 1132   MONOABS 0.4 09/10/2022 1132   EOSABS 0.4 09/10/2022 1132   BASOSABS 0.0 09/10/2022 1132      Latest Ref Rng & Units 11/06/2022    8:00 AM 11/02/2022    8:09 AM 09/10/2022   11:32 AM  CMP  Glucose 70 - 99 mg/dL  78  161   BUN 8 - 23 mg/dL  19  19   Creatinine 0.96 - 1.00 mg/dL  0.45  4.09   Sodium 811 - 145 mmol/L  135  133   Potassium 3.5 - 5.1 mmol/L  3.9  4.6   Chloride 98 - 111 mmol/L  102  100   CO2 22 - 32 mmol/L  25  25   Calcium 8.7 - 10.3 mg/dL 9.4  8.7  9.2   Total Protein 6.5 - 8.1 g/dL  6.0  7.4   Total Bilirubin 0.3 - 1.2 mg/dL  0.6  0.5   Alkaline Phos 38 - 126 U/L  61  79   AST 15 - 41 U/L  7  12   ALT 0 - 44 U/L  7  12     DIAGNOSTIC IMAGING:  I have independently reviewed the relevant imaging and discussed with the patient.   WRAP UP:  All questions were answered. The patient knows to call the clinic with any  problems, questions or concerns.  Medical decision making: Moderate***  Time spent on visit: I spent *** minutes counseling the patient face to face. The total time spent in the appointment was *** minutes and more than 50% was on counseling.  Carnella Guadalajara, PA-C  ***

## 2022-11-17 ENCOUNTER — Inpatient Hospital Stay: Payer: Medicare Other

## 2022-11-17 ENCOUNTER — Other Ambulatory Visit: Payer: Self-pay

## 2022-11-17 ENCOUNTER — Inpatient Hospital Stay: Payer: Medicare Other | Attending: Hematology | Admitting: Physician Assistant

## 2022-11-17 VITALS — BP 150/75 | HR 79 | Temp 98.3°F | Resp 18 | Ht 64.5 in | Wt 116.4 lb

## 2022-11-17 DIAGNOSIS — D631 Anemia in chronic kidney disease: Secondary | ICD-10-CM | POA: Diagnosis not present

## 2022-11-17 DIAGNOSIS — E538 Deficiency of other specified B group vitamins: Secondary | ICD-10-CM | POA: Insufficient documentation

## 2022-11-17 DIAGNOSIS — D508 Other iron deficiency anemias: Secondary | ICD-10-CM | POA: Diagnosis not present

## 2022-11-17 DIAGNOSIS — Z86718 Personal history of other venous thrombosis and embolism: Secondary | ICD-10-CM | POA: Insufficient documentation

## 2022-11-17 DIAGNOSIS — Z7901 Long term (current) use of anticoagulants: Secondary | ICD-10-CM | POA: Diagnosis not present

## 2022-11-17 DIAGNOSIS — E611 Iron deficiency: Secondary | ICD-10-CM | POA: Diagnosis not present

## 2022-11-17 DIAGNOSIS — Z7982 Long term (current) use of aspirin: Secondary | ICD-10-CM | POA: Diagnosis not present

## 2022-11-17 DIAGNOSIS — Z87891 Personal history of nicotine dependence: Secondary | ICD-10-CM | POA: Insufficient documentation

## 2022-11-17 DIAGNOSIS — N1832 Chronic kidney disease, stage 3b: Secondary | ICD-10-CM | POA: Insufficient documentation

## 2022-11-17 DIAGNOSIS — Z79899 Other long term (current) drug therapy: Secondary | ICD-10-CM | POA: Diagnosis not present

## 2022-11-17 MED ORDER — EPOETIN ALFA-EPBX 10000 UNIT/ML IJ SOLN
10000.0000 [IU] | Freq: Once | INTRAMUSCULAR | Status: AC
  Administered 2022-11-17: 10000 [IU] via SUBCUTANEOUS
  Filled 2022-11-17: qty 1

## 2022-11-17 NOTE — Progress Notes (Signed)
Patient tolerated injection with no complaints voiced.  Site clean and dry with no bruising or swelling noted at site.  See MAR for details.  Band aid applied.  Patient stable during and after injection.  Vss with discharge and left in satisfactory condition with no s/s of distress noted.  

## 2022-11-17 NOTE — Patient Instructions (Signed)
[facility] at [facility] **VISIT SUMMARY & IMPORTANT INSTRUCTIONS **   You were seen today by Rojelio Brenner PA-C for your anemia.   We will schedule you for IV iron every 2 weeks x 3 doses We will schedule you for Retacrit every 2 weeks  FOLLOW-UP APPOINTMENT: Office visit in 6 weeks  ** Thank you for trusting me with your healthcare!  I strive to provide all of my patients with quality care at each visit.  If you receive a survey for this visit, I would be so grateful to you for taking the time to provide feedback.  Thank you in advance!  ~ Sylvia Kondracki                   Dr. Doreatha Massed   &   Rojelio Brenner, PA-C   - - - - - - - - - - - - - - - - - -    Thank you for choosing [facility] at [facility] to provide your oncology and hematology care.  To afford each patient quality time with our provider, please arrive at least 15 minutes before your scheduled appointment time.   If you have a lab appointment with the Cancer Center please come in thru the Main Entrance and check in at the main information desk.  You need to re-schedule your appointment should you arrive 10 or more minutes late.  We strive to give you quality time with our providers, and arriving late affects you and other patients whose appointments are after yours.  Also, if you no show three or more times for appointments you may be dismissed from the clinic at the providers discretion.     Again, thank you for choosing [facility].  Our hope is that these requests will decrease the amount of time that you wait before being seen by our physicians.       _____________________________________________________________  Should you have questions after your visit to [facility], please contact our office at [phone] and follow the prompts.  Our office hours are 8:00 a.m. and 4:30 p.m. Monday - Friday.  Please note that voicemails left after  4:00 p.m. may not be returned until the following business day.  We are closed weekends and major holidays.  You do have access to a nurse 24-7, just call the main number to the clinic [phone] and do not press any options, hold on the line and a nurse will answer the phone.    For prescription refill requests, have your pharmacy contact our office and allow 72 hours.

## 2022-11-17 NOTE — Patient Instructions (Signed)
[facility]  Discharge Instructions: Thank you for choosing [facility] to provide your oncology and hematology care.  If you have a lab appointment with the Cancer Center - please note that after April 8th, 2024, all labs will be drawn in the cancer center.  You do not have to check in or register with the main entrance as you have in the past but will complete your check-in in the cancer center.  Wear comfortable clothing and clothing appropriate for easy access to any Portacath or PICC line.   We strive to give you quality time with your provider. You may need to reschedule your appointment if you arrive late (15 or more minutes).  Arriving late affects you and other patients whose appointments are after yours.  Also, if you miss three or more appointments without notifying the office, you may be dismissed from the clinic at the provider's discretion.      For prescription refill requests, have your pharmacy contact our office and allow 72 hours for refills to be completed.  To help prevent nausea and vomiting after your treatment, we encourage you to take your nausea medication as directed.  BELOW ARE SYMPTOMS THAT SHOULD BE REPORTED IMMEDIATELY: *FEVER GREATER THAN 100.4 F (38 C) OR HIGHER *CHILLS OR SWEATING *NAUSEA AND VOMITING THAT IS NOT CONTROLLED WITH YOUR NAUSEA MEDICATION *UNUSUAL SHORTNESS OF BREATH *UNUSUAL BRUISING OR BLEEDING *URINARY PROBLEMS (pain or burning when urinating, or frequent urination) *BOWEL PROBLEMS (unusual diarrhea, constipation, pain near the anus) TENDERNESS IN MOUTH AND THROAT WITH OR WITHOUT PRESENCE OF ULCERS (sore throat, sores in mouth, or a toothache) UNUSUAL RASH, SWELLING OR PAIN  UNUSUAL VAGINAL DISCHARGE OR ITCHING   Items with * indicate a potential emergency and should be followed up as soon as possible or go to the Emergency Department if any problems should occur.  Please show the CHEMOTHERAPY ALERT CARD or  IMMUNOTHERAPY ALERT CARD at check-in to the Emergency Department and triage nurse.  Should you have questions after your visit or need to cancel or reschedule your appointment, please contact [facility] AT  [phone]  and follow the prompts.  Office hours are 8:00 a.m. to 4:30 p.m. Monday - Friday. Please note that voicemails left after 4:00 p.m. may not be returned until the following business day.  We are closed weekends and major holidays. You have access to a nurse at all times for urgent questions. Please call the main number to the clinic [phone] and follow the prompts.  For any non-urgent questions, you may also contact your provider using MyChart. We now offer e-Visits for anyone 18 and older to request care online for non-urgent symptoms. For details visit [email or web address].   Also download the MyChart app! Go to the app store, search "MyChart", open the app, select [place], and log in with your MyChart username and password.   

## 2022-11-19 DIAGNOSIS — H25811 Combined forms of age-related cataract, right eye: Secondary | ICD-10-CM | POA: Diagnosis not present

## 2022-11-20 ENCOUNTER — Non-Acute Institutional Stay (SKILLED_NURSING_FACILITY): Payer: Medicare Other | Admitting: Adult Health

## 2022-11-20 ENCOUNTER — Encounter (HOSPITAL_COMMUNITY)
Admission: RE | Admit: 2022-11-20 | Discharge: 2022-11-20 | Disposition: A | Discharge status: Home / Self Care | Payer: Medicare Other | Source: Ambulatory Visit | Attending: Ophthalmology | Admitting: Ophthalmology

## 2022-11-20 ENCOUNTER — Encounter: Payer: Self-pay | Admitting: Adult Health

## 2022-11-20 DIAGNOSIS — E1122 Type 2 diabetes mellitus with diabetic chronic kidney disease: Secondary | ICD-10-CM

## 2022-11-20 DIAGNOSIS — I129 Hypertensive chronic kidney disease with stage 1 through stage 4 chronic kidney disease, or unspecified chronic kidney disease: Secondary | ICD-10-CM | POA: Diagnosis not present

## 2022-11-20 DIAGNOSIS — I700 Atherosclerosis of aorta: Secondary | ICD-10-CM

## 2022-11-20 DIAGNOSIS — N183 Chronic kidney disease, stage 3 unspecified: Secondary | ICD-10-CM | POA: Diagnosis not present

## 2022-11-20 DIAGNOSIS — D631 Anemia in chronic kidney disease: Secondary | ICD-10-CM | POA: Diagnosis not present

## 2022-11-20 NOTE — Progress Notes (Signed)
Location:  [facility] Room Number: 150P Place of Service:  SNF (31)   CODE STATUS: DNR  Allergies  Allergen Reactions   Latex Rash   Levofloxacin Nausea And Vomiting   Neomycin-Bacitracin-Polymyxin  [Bacitracin-Neomycin-Polymyxin] Rash   Other Swelling and Rash    TOMATO  SOUP-PER MAR   Prednisone Hives, Itching, Rash and Other (See Comments)   Fish Allergy     rash   Shellfish Allergy Hives   Neosporin [Neomycin-Polymyxin-Gramicidin] Itching and Rash    Chief Complaint  Patient presents with   Acute Visit    care plan     HPI:  We have come together for her care plan meeting. BIMS 15/15 mood 0/30. She is nonambulatory dependent for wheelchair mobility with no falls. She requires moderate assist with her adls care. She is incontinent of bladder and bowel. Dietary: weight is 114.4 pounds; regular diet; appetite 1-75%; requires set up for meals. Therapy: none at this time. 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 She continues to be followed for her chronic illnesses including: Aortic atherosclerosis  Type 2 diabetes mellitus with chronic kidney disease stage 3 and hypertension  Anemia due to chronic renal failure treated with  erythropoietin stage 3  Past Medical History:  Diagnosis Date   Allergy    Anemia    pernicious anemia   Arthritis    Asthma    Blood transfusion without reported diagnosis    CKD (chronic kidney disease)    COPD (chronic obstructive pulmonary disease) (HCC) 12/23/2019   Diabetic peripheral neuropathy associated with type 2 diabetes mellitus (HCC) 04/07/2021   DM (diabetes mellitus), type 2 with peripheral vascular complications (HCC) 12/23/2019   Gastritis    GERD (gastroesophageal reflux disease)    Glaucoma    History of blood in urine    Hyperlipidemia    Hypertension    Neuropathy    both feet   Ocular hypertension    Osteoporosis    Stroke (HCC)    Vitamin B12 deficiency anemia due to intrinsic factor deficiency      Past Surgical History:  Procedure Laterality Date   CHOLECYSTECTOMY  2008   COLONOSCOPY  04/30/2011   Procedure: COLONOSCOPY;  Surgeon: Malissa Hippo, MD;  [facility];  Service: Endoscopy;  Laterality: N/A;   COLONOSCOPY  05/25/2012   Procedure: COLONOSCOPY;  Surgeon: Malissa Hippo, MD;  [facility];  Service: Endoscopy;  Laterality: N/A;  1:25-changed to 1200 Ann to notify pt   COLONOSCOPY Bilateral 12/2017   COLONOSCOPY WITH PROPOFOL N/A 11/29/2020   Procedure: COLONOSCOPY WITH PROPOFOL;  Surgeon: Dolores Frame, MD;  [facility]: [facility];  Service: Gastroenterology;  Laterality: N/A;  1:35 patient is in the [facility]   ESOPHAGOGASTRODUODENOSCOPY N/A 12/07/2019   Procedure: ESOPHAGOGASTRODUODENOSCOPY (EGD);  Surgeon: Malissa Hippo, MD;  [facility];  Service: Endoscopy;  Laterality: N/A;  155   GALLBLADDER SURGERY     right breast cystectomy  1988   TUBAL LIGATION  1975    Social History   Socioeconomic History   Marital status: Divorced    Spouse name: Not on file   Number of children: 4   Years of education: 9   Highest education level: Not on file  Occupational History   Occupation: retired   Tobacco Use   Smoking status: Former    Packs/day: 1.00    Years: 40.00    Additional pack years: 0.00    Total pack  years: 40.00    Types: Cigarettes    Quit date: 04/03/2011    Years since quitting: 11.6   Smokeless tobacco: Never  Vaping Use   Vaping Use: Never used  Substance and Sexual Activity   Alcohol use: No   Drug use: No   Sexual activity: Not Currently  Other Topics Concern   Not on file  Social History Narrative   01/17/20 lives at [facility], [facility]   Social Determinants of Health   Financial Resource Strain: Not on file  Food Insecurity: Not on file  Transportation Needs: Not on file  Physical Activity: Not on file  Stress: Not on file  Social Connections: Not on file  Intimate  Partner Violence: Not on file   Family History  Problem Relation Age of Onset   Diabetes Mother    Hyperlipidemia Mother    Hypertension Mother    Heart disease Father    Hyperlipidemia Father    Diabetes Sister    Hyperlipidemia Sister    Hypertension Sister    Cancer Brother    Heart disease Brother    Hyperlipidemia Brother    Hypertension Brother    Diabetes Sister       VITAL SIGNS BP (!) 109/56   Pulse 76   Temp (!) 97.5 F (36.4 C) (Temporal)   Resp 17   Ht 5' 4.5" (1.638 m)   Wt 116 lb 6.4 oz (52.8 kg)   SpO2 99%   BMI 19.67 kg/m   Outpatient Encounter Medications as of 11/20/2022  Medication Sig   amLODipine (NORVASC) 5 MG tablet Take 5 mg by mouth daily.   aspirin EC 81 MG tablet Take 81 mg by mouth daily. Swallow whole.   Calcium Carb-Cholecalciferol (CALCIUM 500 + D) 500-5 MG-MCG TABS Take 1 tablet by mouth daily.   dorzolamide (TRUSOPT) 2 % ophthalmic solution Place 1 drop into both eyes 2 (two) times daily. (0900 & 1700)   folic acid (FOLVITE) 1 MG tablet Take 1 mg by mouth every other day.   gabapentin (NEURONTIN) 100 MG capsule Take 200 mg by mouth at bedtime. (2100)   gabapentin (NEURONTIN) 100 MG capsule Take 100 mg by mouth daily.   Iron, Ferrous Sulfate, 325 (65 Fe) MG TABS Take 1 tablet by mouth daily.   losartan (COZAAR) 25 MG tablet Take 25 mg by mouth daily.   metoCLOPramide (REGLAN) 5 MG tablet Take 1 tablet (5 mg total) by mouth 3 (three) times daily before meals.   Netarsudil-Latanoprost (ROCKLATAN) 0.02-0.005 % SOLN Apply 1 drop to eye every evening.   NON FORMULARY Diet:Regular thin liquids  ALLERGIC TO FISH   omeprazole (PRILOSEC) 40 MG capsule Take 40 mg by mouth daily at 6 (six) AM. (0600)   rosuvastatin (CRESTOR) 20 MG tablet Take 20 mg by mouth daily.   senna-docusate (SENOKOT S) 8.6-50 MG tablet Take 1 tablet by mouth 2 (two) times daily.   sodium chloride 1 g tablet Take 1 g by mouth 2 (two) times daily with a meal.   No  facility-administered encounter medications on file as of 11/20/2022.     SIGNIFICANT DIAGNOSTIC EXAMS  PREVIOUS  05-24-20: dexa: t score -2.387  NO NEW EXAMS.     LABS REVIEWED PREVIOUS     11-10-21: chol 149; ldl 83; trig 80 hdl 50 01-01-22: hgb a1c 5.4 01-14-22: wbc 5.8; hgb 10.2; hct 30.8; mc 89.8 plt 1409; glucose 101; bun 17; creat 1.14; k+ 4.4; na++ 128; ca 8.8; gfr 50;  phos 3.5 albumin 3.7  01-22-22: glucose 78; bun 16; creat 1.13; k+ 4.5; na++ 134; ca 9.0; gfr 50  02-04-22: wbc 8.4; hgb 11.5; hct 36.1; mcv 93.p plt 211; glucose 121; bun 17; creat 1.21; k+ 4.7; na++ 134; ca 9.5; gfr 46; protein 8.3; albumin 4.4; vitamin B 12; 913; folate >40; iron 28; tibc 210  02-05-22: tsh 1.858; chol 117; ldl 63 trig 38 hdl 46  06-04-22: wbc 5.5; hgb 9.9; hct 31.6; mcv 92.4 plt 161; glucose 84; bun 19; creat 1.32; k+ 4.5; na++ 136; ca 9.0; gfr 42; phos 4.2; albumin 3.6; iron 33; tibc 162; ferritin 392; urine ACR: 0.19 09-10-22: wbc 6.5; hgb 10.5; hct 33.0; mcv 93.0 plt 181; glucose 113; bun 19; creat 1.27; k+ 4.6; na++ 133; ca 9.2; gfr 43; protein 7.4 albumin 4.1; vitamin B 12: 1756; folate >40; iron 63 tibc 191 10-01-22: chol 137; ldl 80; trig 53; hdl 46 11-02-22: wbc 6.3; hgb 9.1; hct 28.8; mcv 94.1 plt 136; glucose 78; bun 19; creat 1.44; k+ 3.9; na++ 135; ca 8.7; gfr 37; protein 6.0 albumin 3.3  NO NEW LABS.    Review of Systems  Constitutional:  Negative for malaise/fatigue.  Respiratory:  Negative for cough and shortness of breath.   Cardiovascular:  Negative for chest pain, palpitations and leg swelling.  Gastrointestinal:  Negative for abdominal pain, constipation and heartburn.  Musculoskeletal:  Negative for back pain, joint pain and myalgias.  Skin: Negative.   Neurological:  Negative for dizziness.  Psychiatric/Behavioral:  The patient is not nervous/anxious.    Physical Exam Constitutional:      General: She is not in acute distress.    Appearance: She is well-developed. She is  not diaphoretic.  Neck:     Thyroid: No thyromegaly.  Cardiovascular:     Rate and Rhythm: Normal rate and regular rhythm.     Pulses: Normal pulses.     Heart sounds: Normal heart sounds.  Pulmonary:     Effort: Pulmonary effort is normal. No respiratory distress.     Breath sounds: Normal breath sounds.  Abdominal:     General: Bowel sounds are normal. There is no distension.     Palpations: Abdomen is soft.     Tenderness: There is no abdominal tenderness.  Musculoskeletal:        General: Normal range of motion.     Cervical back: Neck supple.     Right lower leg: No edema.     Left lower leg: No edema.  Lymphadenopathy:     Cervical: No cervical adenopathy.  Skin:    General: Skin is warm and dry.  Neurological:     Mental Status: She is alert and oriented to person, place, and time.  Psychiatric:        Mood and Affect: Mood normal.        ASSESSMENT/ PLAN:  TODAY  Aortic atherosclerosis Type 2 diabetes mellitus with chronic kidney disease stage 3 and hypertension Anemia due to chronic renal failure treated with  erythropoietin stage 3  Will continue current medications Will continue current plan of care Will continue to monitor her status  Time spent with patient 40 minutes: medications; dietary; plan of care.    Synthia Innocent NP [facility]  call [phone]

## 2022-11-24 ENCOUNTER — Inpatient Hospital Stay: Payer: Medicare Other

## 2022-11-24 VITALS — BP 163/78 | HR 88 | Temp 97.8°F | Resp 20

## 2022-11-24 DIAGNOSIS — Z86718 Personal history of other venous thrombosis and embolism: Secondary | ICD-10-CM | POA: Diagnosis not present

## 2022-11-24 DIAGNOSIS — E611 Iron deficiency: Secondary | ICD-10-CM | POA: Diagnosis not present

## 2022-11-24 DIAGNOSIS — Z7901 Long term (current) use of anticoagulants: Secondary | ICD-10-CM | POA: Diagnosis not present

## 2022-11-24 DIAGNOSIS — N1832 Chronic kidney disease, stage 3b: Secondary | ICD-10-CM | POA: Diagnosis not present

## 2022-11-24 DIAGNOSIS — E538 Deficiency of other specified B group vitamins: Secondary | ICD-10-CM | POA: Diagnosis not present

## 2022-11-24 DIAGNOSIS — Z7982 Long term (current) use of aspirin: Secondary | ICD-10-CM | POA: Diagnosis not present

## 2022-11-24 DIAGNOSIS — D631 Anemia in chronic kidney disease: Secondary | ICD-10-CM

## 2022-11-24 DIAGNOSIS — Z79899 Other long term (current) drug therapy: Secondary | ICD-10-CM | POA: Diagnosis not present

## 2022-11-24 DIAGNOSIS — Z87891 Personal history of nicotine dependence: Secondary | ICD-10-CM | POA: Diagnosis not present

## 2022-11-24 MED ORDER — CETIRIZINE HCL 10 MG PO TABS
10.0000 mg | ORAL_TABLET | Freq: Once | ORAL | Status: AC
  Administered 2022-11-24: 10 mg via ORAL
  Filled 2022-11-24: qty 1

## 2022-11-24 MED ORDER — ACETAMINOPHEN 325 MG PO TABS
650.0000 mg | ORAL_TABLET | Freq: Once | ORAL | Status: AC
  Administered 2022-11-24: 650 mg via ORAL
  Filled 2022-11-24: qty 2

## 2022-11-24 MED ORDER — FAMOTIDINE IN NACL 20-0.9 MG/50ML-% IV SOLN
20.0000 mg | Freq: Once | INTRAVENOUS | Status: AC
  Administered 2022-11-24: 20 mg via INTRAVENOUS

## 2022-11-24 MED ORDER — SODIUM CHLORIDE 0.9 % IV SOLN
100.0000 mg | Freq: Once | INTRAVENOUS | Status: AC
  Administered 2022-11-24: 100 mg via INTRAVENOUS
  Filled 2022-11-24: qty 5

## 2022-11-24 MED ORDER — SODIUM CHLORIDE 0.9 % IV SOLN: Freq: Once | INTRAVENOUS | Status: AC

## 2022-11-24 NOTE — Progress Notes (Signed)
Iron infusion given per orders. Patient tolerated it well without problems. Vitals stable and discharged home from clinic via wheelchair. Follow up as scheduled.  

## 2022-11-24 NOTE — Patient Instructions (Addendum)
[facility]  Discharge Instructions: Thank you for choosing [facility] to provide your oncology and hematology care.  If you have a lab appointment with the Cancer Center - please note that after April 8th, 2024, all labs will be drawn in the cancer center.  You do not have to check in or register with the main entrance as you have in the past but will complete your check-in in the cancer center.  Wear comfortable clothing and clothing appropriate for easy access to any Portacath or PICC line.   We strive to give you quality time with your provider. You may need to reschedule your appointment if you arrive late (15 or more minutes).  Arriving late affects you and other patients whose appointments are after yours.  Also, if you miss three or more appointments without notifying the office, you may be dismissed from the clinic at the provider's discretion.      For prescription refill requests, have your pharmacy contact our office and allow 72 hours for refills to be completed.    Today you received the following venofer iron infusion     To help prevent nausea and vomiting after your treatment, we encourage you to take your nausea medication as directed.  BELOW ARE SYMPTOMS THAT SHOULD BE REPORTED IMMEDIATELY: *FEVER GREATER THAN 100.4 F (38 C) OR HIGHER *CHILLS OR SWEATING *NAUSEA AND VOMITING THAT IS NOT CONTROLLED WITH YOUR NAUSEA MEDICATION *UNUSUAL SHORTNESS OF BREATH *UNUSUAL BRUISING OR BLEEDING *URINARY PROBLEMS (pain or burning when urinating, or frequent urination) *BOWEL PROBLEMS (unusual diarrhea, constipation, pain near the anus) TENDERNESS IN MOUTH AND THROAT WITH OR WITHOUT PRESENCE OF ULCERS (sore throat, sores in mouth, or a toothache) UNUSUAL RASH, SWELLING OR PAIN  UNUSUAL VAGINAL DISCHARGE OR ITCHING   Items with * indicate a potential emergency and should be followed up as soon as possible or go to the Emergency Department if any  problems should occur.  Please show the CHEMOTHERAPY ALERT CARD or IMMUNOTHERAPY ALERT CARD at check-in to the Emergency Department and triage nurse.  Should you have questions after your visit or need to cancel or reschedule your appointment, please contact [facility] [phone]  and follow the prompts.  Office hours are 8:00 a.m. to 4:30 p.m. Monday - Friday. Please note that voicemails left after 4:00 p.m. may not be returned until the following business day.  We are closed weekends and major holidays. You have access to a nurse at all times for urgent questions. Please call the main number to the clinic [phone] and follow the prompts.  For any non-urgent questions, you may also contact your provider using MyChart. We now offer e-Visits for anyone 18 and older to request care online for non-urgent symptoms. For details visit [email or web address].   Also download the MyChart app! Go to the app store, search "MyChart", open the app, select , and log in with your MyChart username and password.   

## 2022-11-25 NOTE — H&P (Signed)
Surgical History & Physical  Patient Name: Traci Parker DOB: [birth year]  Surgery: Cataract extraction with intraocular lens implant phacoemulsification; Right Eye  Surgeon: [name] MD Surgery Date:  11-27-22 Pre-Op Date:  11-16-22  HPI: A 44 Yr. old female patient is referred by Dr Charise Killian for cataract eval. Using Dorzolomide BID OU, Latanoprost QHS OU. 1. 1. The patient complains of difficulty when viewing TV, reading closed caption, news scrolls on TV/reading small prints/recognizing people, which began 1 year ago. Both eyes are affected. The episode is gradual. The condition's severity increased since last visit. Symptoms occur when the patient is inside, outside and reading. This is negatively affecting the patient's quality of life and the patient is unable to function adequately in life with the current level of vision. HPI was performed by [name] .  Medical History: Glaucoma Cataracts Diabetes High Blood Pressure LDL Anemia  Review of Systems Negative Allergic/Immunologic Negative Cardiovascular Negative Constitutional Negative Ear, Nose, Mouth & Throat Negative Endocrine Negative Eyes Negative Gastrointestinal Negative Genitourinary Negative Hemotologic/Lymphatic Negative Integumentary Negative Musculoskeletal Negative Neurological Negative Psychiatry Negative Respiratory  Social   Former smoker   Medication Dorzolamide, Latanoprost,  Amlodipine, Atorvastatin,   Sx/Procedures Gallbladder Sx,   Drug Allergies  Prednisone, Neosporin, Latex, Levofloxacin, FISH,   History & Physical: Heent: cataract, right eye NECK: supple without bruits LUNGS: lungs clear to auscultation CV: regular rate and rhythm Abdomen: soft and non-tender Impression & Plan: Assessment: 1.  COMBINED FORMS AGE RELATED CATARACT; Both Eyes (H25.813) 2.  PRIMARY OPEN ANGLE GLAUCOMA; Both Eyes Moderate (H40.1132)  Plan: 1.  Cataract accounts for the patient's decreased  vision. This visual impairment is not correctable with a tolerable change in glasses or contact lenses. Cataract surgery with an implantation of a new lens should significantly improve the visual and functional status of the patient. Discussed all risks, benefits, alternatives, and potential complications. Discussed the procedures and recovery. Patient desires to have surgery. A-scan ordered and performed today for intra-ocular lens calculations. The surgery will be performed in order to improve vision for driving, reading, and for eye examinations. Recommend phacoemulsification with intra-ocular lens. Recommend Dextenza for post-operative pain and inflammation. Right Eye. Dilates poorly - shugarcaine by protocol. Malyugin Ring. Omidira. Recommend goniotomy with iAccess to control pressure in setting of glaucoma.  2.  Enlarged CD ratio. OCT rNFL shows thinning OU. IOPs at goal today Continue Latanoprost 1 drop both eyes every night at bedtime. Continue Dorzolamide 1 drop 2x/day both eyes. Compliance is a problem - patient requires help with drops. Recommend iAccess goniotomy both eyes at the time of cataract surgery to improve compliance and help lower eye pressure.

## 2022-11-27 ENCOUNTER — Encounter (HOSPITAL_COMMUNITY): Payer: Self-pay | Admitting: Ophthalmology

## 2022-11-27 ENCOUNTER — Ambulatory Visit (HOSPITAL_BASED_OUTPATIENT_CLINIC_OR_DEPARTMENT_OTHER): Payer: Medicare Other | Admitting: Anesthesiology

## 2022-11-27 ENCOUNTER — Ambulatory Visit (HOSPITAL_COMMUNITY)
Admission: RE | Admit: 2022-11-27 | Discharge: 2022-11-27 | Disposition: A | Discharge status: Home / Self Care | Payer: Medicare Other | Attending: Ophthalmology | Admitting: Ophthalmology

## 2022-11-27 ENCOUNTER — Ambulatory Visit (HOSPITAL_COMMUNITY): Payer: Medicare Other | Admitting: Anesthesiology

## 2022-11-27 ENCOUNTER — Encounter (HOSPITAL_COMMUNITY)
Admission: RE | Disposition: A | Discharge status: Home / Self Care | Payer: Self-pay | Source: Home / Self Care | Attending: Ophthalmology

## 2022-11-27 DIAGNOSIS — J45909 Unspecified asthma, uncomplicated: Secondary | ICD-10-CM | POA: Diagnosis not present

## 2022-11-27 DIAGNOSIS — Z87891 Personal history of nicotine dependence: Secondary | ICD-10-CM | POA: Insufficient documentation

## 2022-11-27 DIAGNOSIS — H25811 Combined forms of age-related cataract, right eye: Secondary | ICD-10-CM | POA: Diagnosis not present

## 2022-11-27 DIAGNOSIS — I10 Essential (primary) hypertension: Secondary | ICD-10-CM

## 2022-11-27 DIAGNOSIS — I739 Peripheral vascular disease, unspecified: Secondary | ICD-10-CM | POA: Insufficient documentation

## 2022-11-27 DIAGNOSIS — Z79899 Other long term (current) drug therapy: Secondary | ICD-10-CM | POA: Diagnosis not present

## 2022-11-27 DIAGNOSIS — I69351 Hemiplegia and hemiparesis following cerebral infarction affecting right dominant side: Secondary | ICD-10-CM | POA: Diagnosis not present

## 2022-11-27 DIAGNOSIS — J4489 Other specified chronic obstructive pulmonary disease: Secondary | ICD-10-CM | POA: Insufficient documentation

## 2022-11-27 DIAGNOSIS — H401132 Primary open-angle glaucoma, bilateral, moderate stage: Secondary | ICD-10-CM | POA: Insufficient documentation

## 2022-11-27 DIAGNOSIS — E1136 Type 2 diabetes mellitus with diabetic cataract: Secondary | ICD-10-CM | POA: Diagnosis not present

## 2022-11-27 DIAGNOSIS — J449 Chronic obstructive pulmonary disease, unspecified: Secondary | ICD-10-CM

## 2022-11-27 HISTORY — PX: CATARACT EXTRACTION W/PHACO: SHX586

## 2022-11-27 LAB — GLUCOSE, CAPILLARY: Glucose-Capillary: 96 mg/dL (ref 70–99)

## 2022-11-27 SURGERY — PHACOEMULSIFICATION, CATARACT, WITH IOL INSERTION
Anesthesia: Monitor Anesthesia Care | Site: Eye | Laterality: Right

## 2022-11-27 MED ORDER — STERILE WATER FOR IRRIGATION IR SOLN
Status: DC | PRN
  Administered 2022-11-27: 250 mL

## 2022-11-27 MED ORDER — BSS IO SOLN
INTRAOCULAR | Status: DC | PRN
  Administered 2022-11-27: 15 mL via INTRAOCULAR

## 2022-11-27 MED ORDER — SODIUM HYALURONATE 23MG/ML IO SOSY
PREFILLED_SYRINGE | INTRAOCULAR | Status: DC | PRN
  Administered 2022-11-27: .6 mL via INTRAOCULAR

## 2022-11-27 MED ORDER — MIDAZOLAM HCL 2 MG/2ML IJ SOLN
INTRAMUSCULAR | Status: AC
  Filled 2022-11-27: qty 2

## 2022-11-27 MED ORDER — LIDOCAINE HCL 3.5 % OP GEL
1.0000 | Freq: Once | OPHTHALMIC | Status: AC
  Administered 2022-11-27: 1 via OPHTHALMIC

## 2022-11-27 MED ORDER — SODIUM CHLORIDE 0.9% FLUSH
INTRAVENOUS | Status: DC | PRN
  Administered 2022-11-27: 5 mL via INTRAVENOUS

## 2022-11-27 MED ORDER — TROPICAMIDE 1 % OP SOLN
1.0000 [drp] | OPHTHALMIC | Status: AC | PRN
  Administered 2022-11-27 (×3): 1 [drp] via OPHTHALMIC

## 2022-11-27 MED ORDER — POVIDONE-IODINE 5 % OP SOLN
OPHTHALMIC | Status: DC | PRN
  Administered 2022-11-27: 1 via OPHTHALMIC

## 2022-11-27 MED ORDER — PHENYLEPHRINE-KETOROLAC 1-0.3 % IO SOLN
INTRAOCULAR | Status: DC | PRN
  Administered 2022-11-27: 500 mL via OPHTHALMIC

## 2022-11-27 MED ORDER — PHENYLEPHRINE HCL 2.5 % OP SOLN
1.0000 [drp] | OPHTHALMIC | Status: AC | PRN
  Administered 2022-11-27 (×3): 1 [drp] via OPHTHALMIC

## 2022-11-27 MED ORDER — SODIUM HYALURONATE 10 MG/ML IO SOLUTION
PREFILLED_SYRINGE | INTRAOCULAR | Status: DC | PRN
  Administered 2022-11-27: .85 mL via INTRAOCULAR

## 2022-11-27 MED ORDER — LIDOCAINE HCL (PF) 1 % IJ SOLN
INTRAOCULAR | Status: DC | PRN
  Administered 2022-11-27: 1 mL via OPHTHALMIC

## 2022-11-27 MED ORDER — MIDAZOLAM HCL 5 MG/5ML IJ SOLN
INTRAMUSCULAR | Status: DC | PRN
  Administered 2022-11-27 (×2): 1 mg via INTRAVENOUS

## 2022-11-27 MED ORDER — TETRACAINE HCL 0.5 % OP SOLN
1.0000 [drp] | OPHTHALMIC | Status: AC | PRN
  Administered 2022-11-27 (×3): 1 [drp] via OPHTHALMIC

## 2022-11-27 SURGICAL SUPPLY — 15 items
CATARACT SUITE SIGHTPATH (MISCELLANEOUS) ×1 IMPLANT
CLOTH BEACON ORANGE TIMEOUT ST (SAFETY) ×2 IMPLANT
EYE SHIELD UNIVERSAL CLEAR (GAUZE/BANDAGES/DRESSINGS) IMPLANT
FEE CATARACT SUITE SIGHTPATH (MISCELLANEOUS) ×2 IMPLANT
GLOVE BIOGEL PI IND STRL 7.0 (GLOVE) ×4 IMPLANT
LENS IOL RAYNER 21.0 (Intraocular Lens) ×1 IMPLANT
LENS IOL RAYONE EMV 21.0 (Intraocular Lens) IMPLANT
NDL HYPO 18GX1.5 BLUNT FILL (NEEDLE) ×2 IMPLANT
NEEDLE HYPO 18GX1.5 BLUNT FILL (NEEDLE) ×1 IMPLANT
PAD ARMBOARD 7.5X6 YLW CONV (MISCELLANEOUS) ×2 IMPLANT
RING MALYGIN 7.0 (MISCELLANEOUS) IMPLANT
SYR TB 1ML LL NO SAFETY (SYRINGE) ×2 IMPLANT
TAPE PAPER 2X10 WHT MICROPORE (GAUZE/BANDAGES/DRESSINGS) IMPLANT
TAPE SURG TRANSPORE 1 IN (GAUZE/BANDAGES/DRESSINGS) IMPLANT
WATER STERILE IRR 250ML POUR (IV SOLUTION) ×2 IMPLANT

## 2022-11-27 NOTE — Anesthesia Preprocedure Evaluation (Signed)
Anesthesia Evaluation  Patient identified by MRN, date of birth, ID band Patient awake    Reviewed: Allergy & Precautions, H&P , NPO status , Patient's Chart, lab work & pertinent test results  Airway Mallampati: II  TM Distance: >3 FB Neck ROM: Full   Comment: Cervical myelopathy Dental  (+) Edentulous Upper, Edentulous Lower   Pulmonary asthma , COPD, former smoker   Pulmonary exam normal breath sounds clear to auscultation       Cardiovascular Exercise Tolerance: Good hypertension, Pt. on medications + Peripheral Vascular Disease  Normal cardiovascular exam Rhythm:Regular Rate:Normal     Neuro/Psych  PSYCHIATRIC DISORDERS  Depression     Neuromuscular disease CVA (right sided wakness), Residual Symptoms    GI/Hepatic Neg liver ROS,GERD  Controlled,,  Endo/Other  diabetes, Well Controlled, Type 2, Oral Hypoglycemic Agents    Renal/GU Renal InsufficiencyRenal disease  negative genitourinary   Musculoskeletal  (+) Arthritis , Osteoarthritis,    Abdominal   Peds negative pediatric ROS (+)  Hematology  (+) Blood dyscrasia, anemia   Anesthesia Other Findings   Reproductive/Obstetrics negative OB ROS                             Anesthesia Physical Anesthesia Plan  ASA: 3  Anesthesia Plan: MAC   Post-op Pain Management: Minimal or no pain anticipated   Induction: Intravenous  PONV Risk Score and Plan: 0 and Treatment may vary due to age or medical condition  Airway Management Planned: Nasal Cannula and Natural Airway  Additional Equipment:   Intra-op Plan:   Post-operative Plan:   Informed Consent: I have reviewed the patients History and Physical, chart, labs and discussed the procedure including the risks, benefits and alternatives for the proposed anesthesia with the patient or authorized representative who has indicated his/her understanding and acceptance.       Plan  Discussed with: CRNA and Surgeon  Anesthesia Plan Comments:        Anesthesia Quick Evaluation

## 2022-11-27 NOTE — Transfer of Care (Signed)
Immediate Anesthesia Transfer of Care Note  Patient: Traci Parker  Procedure(s) Performed: CATARACT EXTRACTION PHACO AND INTRAOCULAR LENS PLACEMENT (IOC) (Right: Eye)  Patient Location: Short Stay  Anesthesia Type:MAC  Level of Consciousness: awake  Airway & Oxygen Therapy: Patient Spontanous Breathing  Post-op Assessment: Report given to RN  Post vital signs: Reviewed and stable  Last Vitals:  Vitals Value Taken Time  BP    Temp    Pulse    Resp    SpO2      Last Pain:  Vitals:   11/27/22 0657  TempSrc: Oral  PainSc: 0-No pain      Patients Stated Pain Goal: 6 (11/27/22 0657)  Complications: No notable events documented.

## 2022-11-27 NOTE — Interval H&P Note (Signed)
History and Physical Interval Note:  11/27/2022 7:49 AM  Traci Parker  has presented today for surgery, with the diagnosis of combined forms age related cataract; right.  The various methods of treatment have been discussed with the patient and family. After consideration of risks, benefits and other options for treatment, the patient has consented to  Procedure(s): CATARACT EXTRACTION PHACO AND INTRAOCULAR LENS PLACEMENT (IOC) (Right) as a surgical intervention.  The patient's history has been reviewed, patient examined, no change in status, stable for surgery.  I have reviewed the patient's chart and labs.  Questions were answered to the patient's satisfaction.     [name]

## 2022-11-27 NOTE — Anesthesia Postprocedure Evaluation (Signed)
Anesthesia Post Note  Patient: OLEA ARAGON  Procedure(s) Performed: CATARACT EXTRACTION PHACO AND INTRAOCULAR LENS PLACEMENT (IOC) (Right: Eye)  Patient location during evaluation: Short Stay Anesthesia Type: MAC Level of consciousness: awake and alert Pain management: pain level controlled Vital Signs Assessment: post-procedure vital signs reviewed and stable Respiratory status: spontaneous breathing Cardiovascular status: stable Postop Assessment: no apparent nausea or vomiting Anesthetic complications: no   No notable events documented.   Last Vitals:  Vitals:   11/27/22 0657  BP: (!) 164/83  Pulse: 74  Resp: 17  Temp: 36.9 C  SpO2: 100%    Last Pain:  Vitals:   11/27/22 0657  TempSrc: Oral  PainSc: 0-No pain                 Shaya Reddick

## 2022-11-27 NOTE — Op Note (Addendum)
Date of procedure: 11/27/22  Pre-operative diagnosis:  Visually significant combined form age-related cataract, Right Eye (H25.811)  Post-operative diagnosis:  Visually significant combined form age-related cataract, Right Eye (H25.811)  Procedure: Removal of cataract via phacoemulsification and insertion of intra-ocular lens Rayner [id number] +21.0D into the capsular bag of the Right Eye  Attending surgeon: Rudy Jew. Rena Sweeden, MD, MA  Anesthesia: MAC, Topical Akten  Complications: None  Estimated Blood Loss: <72mL (minimal)  Specimens: None  Implants: As above  Indications:  Visually significant age-related cataract, Right Eye  Procedure:  The patient was seen and identified in the pre-operative area. The operative eye was identified and dilated.  The operative eye was marked.  Topical anesthesia was administered to the operative eye.     The patient was then to the operative suite and placed in the supine position.  A timeout was performed confirming the patient, procedure to be performed, and all other relevant information.   The patient's face was prepped and draped in the usual fashion for intra-ocular surgery.  A lid speculum was placed into the operative eye and the surgical microscope moved into place and focused.  A superotemporal paracentesis was created using a 20 gauge paracentesis blade.  Shugarcaine was injected into the anterior chamber.  Viscoelastic was injected into the anterior chamber.  A temporal clear-corneal main wound incision was created using a 2.40mm microkeratome.  A continuous curvilinear capsulorrhexis was initiated using an irrigating cystitome and completed using capsulorrhexis forceps.  Hydrodissection and hydrodeliniation were performed.  Viscoelastic was injected into the anterior chamber.  A phacoemulsification handpiece and a chopper as a second instrument were used to remove the nucleus and epinucleus. The irrigation/aspiration handpiece was used to remove any  remaining cortical material.   The capsular bag was reinflated with viscoelastic, checked, and found to be intact.  The intraocular lens was inserted into the capsular bag.  The irrigation/aspiration handpiece was used to remove any remaining viscoelastic.  The clear corneal wound and paracentesis wounds were then hydrated and checked with Weck-Cels to be watertight. The lid-speculum was removed.  The drape was removed.  The patient's face was cleaned with a wet and dry 4x4. A clear shield was taped over the eye. The patient was taken to the post-operative care unit in good condition, having tolerated the procedure well.  Post-Op Instructions: The patient will follow up at [facility] for a same day post-operative evaluation and will receive all other orders and instructions.

## 2022-11-27 NOTE — Discharge Instructions (Signed)
Please discharge patient when stable, will follow up today with Dr. Shaunn Tackitt at the [facility] office immediately following discharge.  Leave shield in place until visit.  All paperwork with discharge instructions will be given at the office.  [facility] Address:  [address]  [place], [place] [address]  

## 2022-11-30 ENCOUNTER — Non-Acute Institutional Stay (SKILLED_NURSING_FACILITY): Payer: Medicare Other | Admitting: Adult Health

## 2022-11-30 ENCOUNTER — Encounter: Payer: Self-pay | Admitting: Adult Health

## 2022-11-30 DIAGNOSIS — Z86718 Personal history of other venous thrombosis and embolism: Secondary | ICD-10-CM

## 2022-11-30 DIAGNOSIS — I700 Atherosclerosis of aorta: Secondary | ICD-10-CM

## 2022-11-30 DIAGNOSIS — J449 Chronic obstructive pulmonary disease, unspecified: Secondary | ICD-10-CM | POA: Diagnosis not present

## 2022-11-30 NOTE — Progress Notes (Signed)
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 There are no reports of uncontrolled pain. She continues to wear gloves both hands. Her weight is stable.   Past Medical History:  Diagnosis Date   Allergy    Anemia    pernicious anemia   Arthritis    Asthma    Blood transfusion without reported diagnosis    CKD (chronic kidney disease)    COPD (chronic obstructive pulmonary disease) (HCC) 12/23/2019   Diabetic peripheral neuropathy associated with type 2 diabetes mellitus (HCC) 04/07/2021   DM (diabetes mellitus), type 2 with peripheral vascular complications (HCC) 12/23/2019   Gastritis    GERD (gastroesophageal reflux disease)    Glaucoma    History of blood in urine    Hyperlipidemia    Hypertension    Neuropathy    both feet   Ocular hypertension    Osteoporosis    Stroke (HCC)    Vitamin B12 deficiency anemia due to intrinsic factor deficiency     Past Surgical History:  Procedure Laterality Date   CHOLECYSTECTOMY  2008   COLONOSCOPY  04/30/2011   Procedure: COLONOSCOPY;   Surgeon: Malissa Hippo, MD;  [facility]: [facility];  Service: Endoscopy;  Laterality: N/A;   COLONOSCOPY  05/25/2012   Procedure: COLONOSCOPY;  Surgeon: Malissa Hippo, MD;  [facility]: [facility];  Service: Endoscopy;  Laterality: N/A;  1:25-changed to 1200 Ann to notify pt   COLONOSCOPY Bilateral 12/2017   COLONOSCOPY WITH PROPOFOL N/A 11/29/2020   Procedure: COLONOSCOPY WITH PROPOFOL;  Surgeon: Dolores Frame, MD;  Location: [facility];  Service: Gastroenterology;  Laterality: N/A;  1:35 patient is in the [facility]   ESOPHAGOGASTRODUODENOSCOPY N/A 12/07/2019   Procedure: ESOPHAGOGASTRODUODENOSCOPY (EGD);  Surgeon: Malissa Hippo, MD;  Location: [facility];  Service: Endoscopy;  Laterality: N/A;  155   GALLBLADDER SURGERY     right breast cystectomy  1988   TUBAL LIGATION  1975    Social History   Socioeconomic History   Marital status: Divorced    Spouse name: Not on file   Number of children: 4   Years of education: 9   Highest education level: Not on file  Occupational History   Occupation: retired   Tobacco Use   Smoking status: Former    Packs/day: 1.00    Years: 40.00    Additional pack years: 0.00    Total pack years: 40.00    Types: Cigarettes    Quit date: 04/03/2011    Years since  quitting: 11.6   Smokeless tobacco: Never  Vaping Use   Vaping Use: Never used  Substance and Sexual Activity   Alcohol use: No   Drug use: No   Sexual activity: Not Currently  Other Topics Concern   Not on file  Social History Narrative   01/17/20 lives at [facility], [place]   Social Determinants of Health   Financial Resource Strain: Not on file  Food Insecurity: Not on file  Transportation Needs: Not on file  Physical Activity: Not on file  Stress: Not on file  Social Connections: Not on file  Intimate Partner Violence: Not on file   Family History  Problem Relation Age of Onset   Diabetes Mother    Hyperlipidemia Mother     Hypertension Mother    Heart disease Father    Hyperlipidemia Father    Diabetes Sister    Hyperlipidemia Sister    Hypertension Sister    Cancer Brother    Heart disease Brother    Hyperlipidemia Brother    Hypertension Brother    Diabetes Sister       VITAL SIGNS BP 122/64   Pulse 84   Temp 97.6 F (36.4 C)   Resp 14   Ht 5\' 4"  (1.626 m)   Wt 116 lb 6.4 oz (52.8 kg)   SpO2 97%   BMI 19.98 kg/m   Outpatient Encounter Medications as of 11/30/2022  Medication Sig   Amino Acids-Protein Hydrolys (PRO-STAT AWC PO) Take 30 mLs by mouth once.   amLODipine (NORVASC) 5 MG tablet Take 5 mg by mouth daily.   aspirin EC 81 MG tablet Take 81 mg by mouth daily. Swallow whole.   Calcium Carb-Cholecalciferol (CALCIUM 500 + D) 500-5 MG-MCG TABS Take 1 tablet by mouth daily.   dorzolamide (TRUSOPT) 2 % ophthalmic solution Place 1 drop into both eyes 2 (two) times daily. (0900 & 1700)   folic acid (FOLVITE) 1 MG tablet Take 1 mg by mouth every other day.   gabapentin (NEURONTIN) 100 MG capsule Take 200 mg by mouth at bedtime. (2100)   gabapentin (NEURONTIN) 100 MG capsule Take 100 mg by mouth daily.   Iron, Ferrous Sulfate, 325 (65 Fe) MG TABS Take 1 tablet by mouth daily.   losartan (COZAAR) 25 MG tablet Take 25 mg by mouth daily.   metoCLOPramide (REGLAN) 5 MG tablet Take 1 tablet (5 mg total) by mouth 3 (three) times daily before meals.   moxifloxacin (VIGAMOX) 0.5 % ophthalmic solution Place 1 drop into the right eye 3 (three) times daily.   nepafenac (ILEVRO) 0.3 % ophthalmic suspension 1 drop daily.   NON FORMULARY Diet:Regular thin liquids  ALLERGIC TO FISH   omeprazole (PRILOSEC) 40 MG capsule Take 40 mg by mouth daily at 6 (six) AM. (0600)   prednisoLONE acetate (PRED FORTE) 1 % ophthalmic suspension 1 drop in the morning, at noon, and at bedtime.   rosuvastatin (CRESTOR) 20 MG tablet Take 20 mg by mouth daily.   senna-docusate (SENOKOT S) 8.6-50 MG tablet Take 1 tablet by mouth  2 (two) times daily.   sodium chloride 1 g tablet Take 1 g by mouth 2 (two) times daily with a meal.   [DISCONTINUED] Netarsudil-Latanoprost (ROCKLATAN) 0.02-0.005 % SOLN Apply 1 drop to eye every evening.   No facility-administered encounter medications on file as of 11/30/2022.     SIGNIFICANT DIAGNOSTIC EXAMS  PREVIOUS  05-24-20: dexa: t score -2.387  NO NEW EXAMS.     LABS  REVIEWED PREVIOUS     10-13-21: urine micro-albumin 12.8 11-10-21: chol 149; ldl 83; trig 80 hdl 50 01-01-22: hgb a1c 5.4 01-14-22: wbc 5.8; hgb 10.2; hct 30.8; mc 89.8 plt 1409; glucose 101; bun 17; creat 1.14; k+ 4.4; na++ 128; ca 8.8; gfr 50; phos 3.5 albumin 3.7  01-22-22: glucose 78; bun 16; creat 1.13; k+ 4.5; na++ 134; ca 9.0; gfr 50  02-04-22: wbc 8.4; hgb 11.5; hct 36.1; mcv 93.p plt 211; glucose 121; bun 17; creat 1.21; k+ 4.7; na++ 134; ca 9.5; gfr 46; protein 8.3; albumin 4.4; vitamin B 12; 913; folate >40; iron 28; tibc 210  02-05-22: tsh 1.858; chol 117; ldl 63 trig 38 hdl 46  06-04-22: wbc 5.5; hgb 9.9; hct 31.6; mcv 92.4 plt 161; glucose 84; bun 19; creat 1.32; k+ 4.5; na++ 136; ca 9.0; gfr 42; phos 4.2; albumin 3.6; iron 33; tibc 162; ferritin 392; urine ACR: 0.19 09-10-22: wbc 6.5; hgb 10.5; hct 33.0; mcv 93.0 plt 181; glucose 113; bun 19; creat 1.27; k+ 4.6; na++ 133; ca 9.2; gfr 43; protein 7.4 albumin 4.1; vitamin B 12: 1756; folate >40; iron 63 tibc 191 10-01-22: chol 137; ldl 80; trig 53; hdl 46 11-02-22: wbc 6.3; hgb 9.1; hct 28.8; mcv 94.1 plt 136; glucose 78; bun 19; creat 1.44; k+ 3.9; na++ 135; ca 8.7; gfr 37; protein 6.0 albumin 3.3  TODAY  11-05-22: ACR 0.17 11-06-22: PTH 25 ca 9.4 11-11-22; folate 32.8 vitamin B12: 459; iron 30; tibc 172; ferritin 452; serotonin 78    Review of Systems  Constitutional:  Negative for malaise/fatigue.  Respiratory:  Negative for cough and shortness of breath.   Cardiovascular:  Negative for chest pain, palpitations and leg swelling.  Gastrointestinal:   Negative for abdominal pain, constipation and heartburn.  Musculoskeletal:  Negative for back pain, joint pain and myalgias.  Skin: Negative.   Neurological:  Negative for dizziness.  Psychiatric/Behavioral:  The patient is not nervous/anxious.     Physical Exam Constitutional:      General: She is not in acute distress.    Appearance: She is well-developed. She is not diaphoretic.  Neck:     Thyroid: No thyromegaly.  Cardiovascular:     Rate and Rhythm: Normal rate and regular rhythm.     Heart sounds: Normal heart sounds.  Pulmonary:     Effort: Pulmonary effort is normal. No respiratory distress.     Breath sounds: Normal breath sounds.  Abdominal:     General: Bowel sounds are normal. There is no distension.     Palpations: Abdomen is soft.     Tenderness: There is no abdominal tenderness.  Musculoskeletal:        General: Normal range of motion.     Cervical back: Neck supple.     Right lower leg: No edema.     Left lower leg: No edema.  Lymphadenopathy:     Cervical: No cervical adenopathy.  Skin:    General: Skin is warm and dry.  Neurological:     Mental Status: She is alert and oriented to person, place, and time.  Psychiatric:        Mood and Affect: Mood normal.       ASSESSMENT/ PLAN:  TODAY   Aortic atherosclerosis: (ct 12-23-19) will monitor  2. 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 Chronic obstructive pulmonary disease unspecified COPD type: will monitor   PREVIOUS  4. CKD stage 3 due to type 2 diabetes mellitus: bun 19; creat 1.27; gfr 43   5. Diabetic peripheral neuropathy associated with type 2 diabetes mellitus: is on gabapentin 200 mg twice daily   6. Other chronic gastritis without hemorrhage: will continue prilosec 40 mg daily reglan 5 mg with meals.   7. Type 2 diabetes mellitus with stage 3 chronic kidney disease without long term current use of insulin: hgb A1c 5.3;  81 mg asa; is on statin and arb  8. Hyperlipidemia associated with type 2 diabetes mellitus: ldl 63 is on crestor 10 mg daily    9. Hypertension associated with stage 3 chronic kidney disease due to type 2 diabetes mellitus: b/p 122/64: will continue norvasc 5 mg daily and cozaar 25 mg daily   10. Anemia due to chronic renal failure treated with erythropoietin stage 3: hgb 10.5 will continue iron daily and is followed by oncology  11. Increased intraocular pressure bilateral: will continue trusopt to both eyes twice daily  12. Vitamin B 12 deficiency: level 459; folate 32.8   13. Vitamin D deficiency: level 41.14 will change to 1000 units daily   14. Thyroiditis: tsh 1.858 will monitor  15. Cervical myelopathy: is without change  16. Failure to thrive in adult: weight is 116 pounds; will continue to monitor     Synthia Innocent NP [facility]  call [phone]

## 2022-12-01 ENCOUNTER — Inpatient Hospital Stay: Payer: Medicare Other

## 2022-12-01 ENCOUNTER — Encounter (HOSPITAL_COMMUNITY): Payer: Self-pay | Admitting: Ophthalmology

## 2022-12-01 DIAGNOSIS — N183 Chronic kidney disease, stage 3 unspecified: Secondary | ICD-10-CM

## 2022-12-01 DIAGNOSIS — D631 Anemia in chronic kidney disease: Secondary | ICD-10-CM

## 2022-12-01 DIAGNOSIS — Z79899 Other long term (current) drug therapy: Secondary | ICD-10-CM | POA: Diagnosis not present

## 2022-12-01 DIAGNOSIS — Z87891 Personal history of nicotine dependence: Secondary | ICD-10-CM | POA: Diagnosis not present

## 2022-12-01 DIAGNOSIS — E611 Iron deficiency: Secondary | ICD-10-CM | POA: Diagnosis not present

## 2022-12-01 DIAGNOSIS — Z86718 Personal history of other venous thrombosis and embolism: Secondary | ICD-10-CM | POA: Diagnosis not present

## 2022-12-01 DIAGNOSIS — N1832 Chronic kidney disease, stage 3b: Secondary | ICD-10-CM | POA: Diagnosis not present

## 2022-12-01 DIAGNOSIS — E538 Deficiency of other specified B group vitamins: Secondary | ICD-10-CM

## 2022-12-01 DIAGNOSIS — Z7982 Long term (current) use of aspirin: Secondary | ICD-10-CM | POA: Diagnosis not present

## 2022-12-01 DIAGNOSIS — Z7901 Long term (current) use of anticoagulants: Secondary | ICD-10-CM | POA: Diagnosis not present

## 2022-12-01 LAB — CBC
HCT: 35.9 % — ABNORMAL LOW (ref 36.0–46.0)
Hemoglobin: 11.1 g/dL — ABNORMAL LOW (ref 12.0–15.0)
MCH: 29.2 pg (ref 26.0–34.0)
MCHC: 30.9 g/dL (ref 30.0–36.0)
MCV: 94.5 fL (ref 80.0–100.0)
Platelets: 157 10*3/uL (ref 150–400)
RBC: 3.8 MIL/uL — ABNORMAL LOW (ref 3.87–5.11)
RDW: 14.6 % (ref 11.5–15.5)
WBC: 7.1 10*3/uL (ref 4.0–10.5)
nRBC: 0 % (ref 0.0–0.2)

## 2022-12-01 NOTE — Progress Notes (Signed)
Patient presents today for Retacrit injection, no injection needed today d/t hemoglobin of 11.1. Patient made aware and discharged in satisfactory condition.

## 2022-12-04 DIAGNOSIS — E1151 Type 2 diabetes mellitus with diabetic peripheral angiopathy without gangrene: Secondary | ICD-10-CM | POA: Diagnosis not present

## 2022-12-04 DIAGNOSIS — E1142 Type 2 diabetes mellitus with diabetic polyneuropathy: Secondary | ICD-10-CM | POA: Diagnosis not present

## 2022-12-04 DIAGNOSIS — I739 Peripheral vascular disease, unspecified: Secondary | ICD-10-CM | POA: Diagnosis not present

## 2022-12-04 DIAGNOSIS — L603 Nail dystrophy: Secondary | ICD-10-CM | POA: Diagnosis not present

## 2022-12-04 DIAGNOSIS — L84 Corns and callosities: Secondary | ICD-10-CM | POA: Diagnosis not present

## 2022-12-08 ENCOUNTER — Inpatient Hospital Stay: Payer: Medicare Other

## 2022-12-08 VITALS — BP 150/69 | HR 71 | Temp 98.5°F | Resp 18

## 2022-12-08 DIAGNOSIS — E611 Iron deficiency: Secondary | ICD-10-CM | POA: Diagnosis not present

## 2022-12-08 DIAGNOSIS — Z86718 Personal history of other venous thrombosis and embolism: Secondary | ICD-10-CM | POA: Diagnosis not present

## 2022-12-08 DIAGNOSIS — N1832 Chronic kidney disease, stage 3b: Secondary | ICD-10-CM | POA: Diagnosis not present

## 2022-12-08 DIAGNOSIS — Z7901 Long term (current) use of anticoagulants: Secondary | ICD-10-CM | POA: Diagnosis not present

## 2022-12-08 DIAGNOSIS — E538 Deficiency of other specified B group vitamins: Secondary | ICD-10-CM | POA: Diagnosis not present

## 2022-12-08 DIAGNOSIS — Z79899 Other long term (current) drug therapy: Secondary | ICD-10-CM | POA: Diagnosis not present

## 2022-12-08 DIAGNOSIS — D631 Anemia in chronic kidney disease: Secondary | ICD-10-CM

## 2022-12-08 DIAGNOSIS — Z87891 Personal history of nicotine dependence: Secondary | ICD-10-CM | POA: Diagnosis not present

## 2022-12-08 DIAGNOSIS — Z7982 Long term (current) use of aspirin: Secondary | ICD-10-CM | POA: Diagnosis not present

## 2022-12-08 MED ORDER — ACETAMINOPHEN 325 MG PO TABS
650.0000 mg | ORAL_TABLET | Freq: Once | ORAL | Status: AC
  Administered 2022-12-08: 650 mg via ORAL
  Filled 2022-12-08: qty 2

## 2022-12-08 MED ORDER — CETIRIZINE HCL 10 MG PO TABS
10.0000 mg | ORAL_TABLET | Freq: Once | ORAL | Status: AC
  Administered 2022-12-08: 10 mg via ORAL
  Filled 2022-12-08: qty 1

## 2022-12-08 MED ORDER — SODIUM CHLORIDE 0.9 % IV SOLN: Freq: Once | INTRAVENOUS | Status: AC

## 2022-12-08 MED ORDER — FAMOTIDINE IN NACL 20-0.9 MG/50ML-% IV SOLN
20.0000 mg | Freq: Once | INTRAVENOUS | Status: AC
  Administered 2022-12-08: 20 mg via INTRAVENOUS
  Filled 2022-12-08: qty 50

## 2022-12-08 MED ORDER — SODIUM CHLORIDE 0.9 % IV SOLN
100.0000 mg | Freq: Once | INTRAVENOUS | Status: AC
  Administered 2022-12-08: 100 mg via INTRAVENOUS
  Filled 2022-12-08: qty 5

## 2022-12-08 NOTE — Progress Notes (Signed)
Iron infusion given today per MD orders. Tolerated infusion without adverse affects. Vital signs stable. No complaints at this time.   Discharged from clinic by wheel chair in stable condition. Alert and oriented x 3. F/U with [facility] as scheduled.

## 2022-12-08 NOTE — Progress Notes (Signed)
Patient presents today for iron infusion.  Patient is in satisfactory condition with no new complaints voiced.  Vital signs are stable.  We will proceed with infusion per provider orders. 

## 2022-12-11 NOTE — H&P (Signed)
Surgical History & Physical  Patient Name: Traci Parker DOB: 12-29-1945  Surgery: Cataract extraction with intraocular lens implant phacoemulsification; Left Eye  Surgeon: [name] MD Surgery Date:  12-18-22 Pre-Op Date:  12-03-22  HPI: A 63 Yr. old female patient 1. The patient is returning after cataract post-op. The right eye is affected. Status post cataract post-op, 1 week ago: Since the last visit, the affected area feels improvement. The patient's vision is improved. Patient is following postop medication instructions. The patient complains of difficulty when viewing TV, reading closed caption, news scrolls on TV/reading small prints/recognizing people, which began 1 year ago. Left eye is affected. The episode is gradual. The condition's severity increased since last visit. Symptoms occur when the patient is inside, outside and reading. This is negatively affecting the patient's quality of life and the patient is unable to function adequately in life with the current level of vision. HPI Completed by Dr. [name]  Medical History: Glaucoma Cataracts Diabetes High Blood Pressure LDL Anemia  Review of Systems Negative Allergic/Immunologic Negative Cardiovascular Negative Constitutional Negative Ear, Nose, Mouth & Throat Negative Endocrine Negative Eyes Negative Gastrointestinal Negative Genitourinary Negative Hemotologic/Lymphatic Negative Integumentary Negative Musculoskeletal Negative Neurological Negative Psychiatry Negative Respiratory  Social   Former smoker   Medication Dorzolamide, Latanoprost, Prednisolone acetate 1%, Moxifloxacin, Ilevro,  Amlodipine, Atorvastatin,   Sx/Procedures Phaco c IOL OD,  Gallbladder Sx,   Drug Allergies  Prednisone, Neosporin, Latex, Levofloxacin, FISH,   History & Physical: Heent: cataract, left eye NECK: supple without bruits LUNGS: lungs clear to auscultation CV: regular rate and rhythm Abdomen: soft and  non-tender Impression & Plan: Assessment: 1.  CATARACT EXTRACTION STATUS; Right Eye (Z98.41) 2.  INTRAOCULAR LENS IOL ; Right Eye (Z96.1) 3.  COMBINED FORMS AGE RELATED CATARACT; Left Eye (H25.812)  Plan: 1.  1 week after cataract surgery. Doing well with improved vision and normal eye pressure. Call with any problems or concerns. Stop Moxifloxacin. Continue Ilevro 1 drop 1x/day for 3 more weeks. Continue Pred Acetate 1 drop 2x/day for 3 more weeks.  2.  Doing well since surgery Continue Post-op medications  3.  Cataract accounts for the patient's decreased vision. This visual impairment is not correctable with a tolerable change in glasses or contact lenses. Cataract surgery with an implantation of a new lens should significantly improve the visual and functional status of the patient. Discussed all risks, benefits, alternatives, and potential complications. Discussed the procedures and recovery. Patient desires to have surgery. A-scan ordered and performed today for intra-ocular lens calculations. The surgery will be performed in order to improve vision for driving, reading, and for eye examinations. Recommend phacoemulsification with intra-ocular lens. Recommend Dextenza for post-operative pain and inflammation. Left Eye. Surgery required to correct imbalance of vision. Dilates poorly - shugarcaine by protocol. Malyugin Ring. Omidira.

## 2022-12-14 DIAGNOSIS — H25812 Combined forms of age-related cataract, left eye: Secondary | ICD-10-CM | POA: Diagnosis not present

## 2022-12-15 ENCOUNTER — Inpatient Hospital Stay: Payer: Medicare Other

## 2022-12-15 ENCOUNTER — Inpatient Hospital Stay: Payer: Medicare Other | Attending: Hematology

## 2022-12-15 VITALS — BP 141/77 | HR 90 | Temp 99.0°F | Resp 18

## 2022-12-15 DIAGNOSIS — Z7901 Long term (current) use of anticoagulants: Secondary | ICD-10-CM | POA: Diagnosis not present

## 2022-12-15 DIAGNOSIS — Z7982 Long term (current) use of aspirin: Secondary | ICD-10-CM | POA: Diagnosis not present

## 2022-12-15 DIAGNOSIS — E611 Iron deficiency: Secondary | ICD-10-CM | POA: Insufficient documentation

## 2022-12-15 DIAGNOSIS — N1832 Chronic kidney disease, stage 3b: Secondary | ICD-10-CM | POA: Insufficient documentation

## 2022-12-15 DIAGNOSIS — Z792 Long term (current) use of antibiotics: Secondary | ICD-10-CM | POA: Diagnosis not present

## 2022-12-15 DIAGNOSIS — Z86718 Personal history of other venous thrombosis and embolism: Secondary | ICD-10-CM | POA: Diagnosis not present

## 2022-12-15 DIAGNOSIS — Z87891 Personal history of nicotine dependence: Secondary | ICD-10-CM | POA: Diagnosis not present

## 2022-12-15 DIAGNOSIS — Z9049 Acquired absence of other specified parts of digestive tract: Secondary | ICD-10-CM | POA: Diagnosis not present

## 2022-12-15 DIAGNOSIS — E538 Deficiency of other specified B group vitamins: Secondary | ICD-10-CM | POA: Insufficient documentation

## 2022-12-15 DIAGNOSIS — D631 Anemia in chronic kidney disease: Secondary | ICD-10-CM

## 2022-12-15 DIAGNOSIS — Z79899 Other long term (current) drug therapy: Secondary | ICD-10-CM | POA: Insufficient documentation

## 2022-12-15 LAB — CBC
HCT: 35.3 % — ABNORMAL LOW (ref 36.0–46.0)
Hemoglobin: 11 g/dL — ABNORMAL LOW (ref 12.0–15.0)
MCH: 29.5 pg (ref 26.0–34.0)
MCHC: 31.2 g/dL (ref 30.0–36.0)
MCV: 94.6 fL (ref 80.0–100.0)
Platelets: 147 10*3/uL — ABNORMAL LOW (ref 150–400)
RBC: 3.73 MIL/uL — ABNORMAL LOW (ref 3.87–5.11)
RDW: 13.7 % (ref 11.5–15.5)
WBC: 7 10*3/uL (ref 4.0–10.5)
nRBC: 0 % (ref 0.0–0.2)

## 2022-12-15 MED ORDER — EPOETIN ALFA-EPBX 10000 UNIT/ML IJ SOLN
10000.0000 [IU] | Freq: Once | INTRAMUSCULAR | Status: AC
  Administered 2022-12-15: 10000 [IU] via SUBCUTANEOUS
  Filled 2022-12-15: qty 1

## 2022-12-15 NOTE — Patient Instructions (Signed)
[facility]  Discharge Instructions: Thank you for choosing [facility] to provide your oncology and hematology care.  If you have a lab appointment with the Cancer Center - please note that after April 8th, 2024, all labs will be drawn in the cancer center.  You do not have to check in or register with the main entrance as you have in the past but will complete your check-in in the cancer center.  Wear comfortable clothing and clothing appropriate for easy access to any Portacath or PICC line.   We strive to give you quality time with your provider. You may need to reschedule your appointment if you arrive late (15 or more minutes).  Arriving late affects you and other patients whose appointments are after yours.  Also, if you miss three or more appointments without notifying the office, you may be dismissed from the clinic at the provider's discretion.      For prescription refill requests, have your pharmacy contact our office and allow 72 hours for refills to be completed.    Today you received the following Retacrit, return as scheduled.   To help prevent nausea and vomiting after your treatment, we encourage you to take your nausea medication as directed.  BELOW ARE SYMPTOMS THAT SHOULD BE REPORTED IMMEDIATELY: *FEVER GREATER THAN 100.4 F (38 C) OR HIGHER *CHILLS OR SWEATING *NAUSEA AND VOMITING THAT IS NOT CONTROLLED WITH YOUR NAUSEA MEDICATION *UNUSUAL SHORTNESS OF BREATH *UNUSUAL BRUISING OR BLEEDING *URINARY PROBLEMS (pain or burning when urinating, or frequent urination) *BOWEL PROBLEMS (unusual diarrhea, constipation, pain near the anus) TENDERNESS IN MOUTH AND THROAT WITH OR WITHOUT PRESENCE OF ULCERS (sore throat, sores in mouth, or a toothache) UNUSUAL RASH, SWELLING OR PAIN  UNUSUAL VAGINAL DISCHARGE OR ITCHING   Items with * indicate a potential emergency and should be followed up as soon as possible or go to the Emergency Department if  any problems should occur.  Please show the CHEMOTHERAPY ALERT CARD or IMMUNOTHERAPY ALERT CARD at check-in to the Emergency Department and triage nurse.  Should you have questions after your visit or need to cancel or reschedule your appointment, please contact [facility] [phone]  and follow the prompts.  Office hours are 8:00 a.m. to 4:30 p.m. Monday - Friday. Please note that voicemails left after 4:00 p.m. may not be returned until the following business day.  We are closed weekends and major holidays. You have access to a nurse at all times for urgent questions. Please call the main number to the clinic [phone] and follow the prompts.  For any non-urgent questions, you may also contact your provider using MyChart. We now offer e-Visits for anyone 18 and older to request care online for non-urgent symptoms. For details visit [email or web address].   Also download the MyChart app! Go to the app store, search "MyChart", open the app, select [facility], and log in with your MyChart username and password.   

## 2022-12-15 NOTE — Progress Notes (Signed)
Patient presents today for Retacrit injection. Hemoglobin reviewed prior to administration. VSS tolerated without incident or complaint. See MAR for details. Patient stable during and after injection. Patient discharged in satisfactory condition with no s/s of distress noted.  

## 2022-12-16 ENCOUNTER — Encounter (HOSPITAL_COMMUNITY)
Admission: RE | Admit: 2022-12-16 | Discharge: 2022-12-16 | Disposition: A | Discharge status: Home / Self Care | Payer: Medicare Other | Source: Ambulatory Visit | Attending: Ophthalmology | Admitting: Ophthalmology

## 2022-12-18 ENCOUNTER — Encounter (HOSPITAL_COMMUNITY): Payer: Self-pay | Admitting: Ophthalmology

## 2022-12-18 ENCOUNTER — Ambulatory Visit (HOSPITAL_COMMUNITY): Payer: Medicare Other | Admitting: Certified Registered Nurse Anesthetist

## 2022-12-18 ENCOUNTER — Ambulatory Visit (HOSPITAL_COMMUNITY)
Admission: RE | Admit: 2022-12-18 | Discharge: 2022-12-18 | Disposition: A | Discharge status: Home / Self Care | Payer: Medicare Other | Attending: Ophthalmology | Admitting: Ophthalmology

## 2022-12-18 ENCOUNTER — Ambulatory Visit (HOSPITAL_BASED_OUTPATIENT_CLINIC_OR_DEPARTMENT_OTHER): Payer: Medicare Other | Admitting: Certified Registered Nurse Anesthetist

## 2022-12-18 ENCOUNTER — Encounter (HOSPITAL_COMMUNITY)
Admission: RE | Disposition: A | Discharge status: Home / Self Care | Payer: Self-pay | Source: Home / Self Care | Attending: Ophthalmology

## 2022-12-18 DIAGNOSIS — Z9841 Cataract extraction status, right eye: Secondary | ICD-10-CM | POA: Insufficient documentation

## 2022-12-18 DIAGNOSIS — J45909 Unspecified asthma, uncomplicated: Secondary | ICD-10-CM | POA: Diagnosis not present

## 2022-12-18 DIAGNOSIS — E1136 Type 2 diabetes mellitus with diabetic cataract: Secondary | ICD-10-CM | POA: Diagnosis not present

## 2022-12-18 DIAGNOSIS — Z961 Presence of intraocular lens: Secondary | ICD-10-CM | POA: Insufficient documentation

## 2022-12-18 DIAGNOSIS — Z87891 Personal history of nicotine dependence: Secondary | ICD-10-CM

## 2022-12-18 DIAGNOSIS — E1151 Type 2 diabetes mellitus with diabetic peripheral angiopathy without gangrene: Secondary | ICD-10-CM | POA: Insufficient documentation

## 2022-12-18 DIAGNOSIS — D649 Anemia, unspecified: Secondary | ICD-10-CM | POA: Diagnosis not present

## 2022-12-18 DIAGNOSIS — J449 Chronic obstructive pulmonary disease, unspecified: Secondary | ICD-10-CM | POA: Insufficient documentation

## 2022-12-18 DIAGNOSIS — I10 Essential (primary) hypertension: Secondary | ICD-10-CM | POA: Insufficient documentation

## 2022-12-18 DIAGNOSIS — Z7984 Long term (current) use of oral hypoglycemic drugs: Secondary | ICD-10-CM | POA: Diagnosis not present

## 2022-12-18 DIAGNOSIS — H25812 Combined forms of age-related cataract, left eye: Secondary | ICD-10-CM | POA: Diagnosis not present

## 2022-12-18 DIAGNOSIS — D759 Disease of blood and blood-forming organs, unspecified: Secondary | ICD-10-CM | POA: Insufficient documentation

## 2022-12-18 HISTORY — PX: CATARACT EXTRACTION W/PHACO: SHX586

## 2022-12-18 LAB — GLUCOSE, CAPILLARY: Glucose-Capillary: 121 mg/dL — ABNORMAL HIGH (ref 70–99)

## 2022-12-18 SURGERY — PHACOEMULSIFICATION, CATARACT, WITH IOL INSERTION
Anesthesia: Monitor Anesthesia Care | Site: Eye | Laterality: Left

## 2022-12-18 MED ORDER — SODIUM HYALURONATE 10 MG/ML IO SOLUTION
PREFILLED_SYRINGE | INTRAOCULAR | Status: DC | PRN
  Administered 2022-12-18: .85 mL via INTRAOCULAR

## 2022-12-18 MED ORDER — MIDAZOLAM HCL 2 MG/2ML IJ SOLN
INTRAMUSCULAR | Status: AC
  Filled 2022-12-18: qty 2

## 2022-12-18 MED ORDER — ONDANSETRON HCL 4 MG/2ML IJ SOLN
INTRAMUSCULAR | Status: DC | PRN
  Administered 2022-12-18: 4 mg via INTRAVENOUS

## 2022-12-18 MED ORDER — PHENYLEPHRINE-KETOROLAC 1-0.3 % IO SOLN
INTRAOCULAR | Status: AC
  Filled 2022-12-18: qty 4

## 2022-12-18 MED ORDER — SODIUM HYALURONATE 23MG/ML IO SOSY
PREFILLED_SYRINGE | INTRAOCULAR | Status: DC | PRN
  Administered 2022-12-18: .6 mL via INTRAOCULAR

## 2022-12-18 MED ORDER — TRYPAN BLUE 0.06 % IO SOSY
PREFILLED_SYRINGE | INTRAOCULAR | Status: AC
  Filled 2022-12-18: qty 0.5

## 2022-12-18 MED ORDER — LIDOCAINE HCL (PF) 1 % IJ SOLN
INTRAOCULAR | Status: DC | PRN
  Administered 2022-12-18: 1 mL via OPHTHALMIC

## 2022-12-18 MED ORDER — EPINEPHRINE PF 1 MG/ML IJ SOLN
INTRAMUSCULAR | Status: AC
  Filled 2022-12-18: qty 1

## 2022-12-18 MED ORDER — LACTATED RINGERS IV SOLN: INTRAVENOUS | Status: DC

## 2022-12-18 MED ORDER — TETRACAINE HCL 0.5 % OP SOLN
1.0000 [drp] | OPHTHALMIC | Status: AC | PRN
  Administered 2022-12-18 (×3): 1 [drp] via OPHTHALMIC

## 2022-12-18 MED ORDER — BSS IO SOLN
INTRAOCULAR | Status: DC | PRN
  Administered 2022-12-18: 15 mL via INTRAOCULAR

## 2022-12-18 MED ORDER — POVIDONE-IODINE 5 % OP SOLN
OPHTHALMIC | Status: DC | PRN
  Administered 2022-12-18: 1 via OPHTHALMIC

## 2022-12-18 MED ORDER — MIDAZOLAM HCL 5 MG/5ML IJ SOLN
INTRAMUSCULAR | Status: DC | PRN
  Administered 2022-12-18: .5 mg via INTRAVENOUS

## 2022-12-18 MED ORDER — PHENYLEPHRINE-KETOROLAC 1-0.3 % IO SOLN
INTRAOCULAR | Status: DC | PRN
  Administered 2022-12-18: 500 mL via OPHTHALMIC

## 2022-12-18 MED ORDER — LIDOCAINE HCL 3.5 % OP GEL
1.0000 | Freq: Once | OPHTHALMIC | Status: AC
  Administered 2022-12-18: 1 via OPHTHALMIC

## 2022-12-18 MED ORDER — PHENYLEPHRINE HCL 2.5 % OP SOLN
1.0000 [drp] | OPHTHALMIC | Status: AC | PRN
  Administered 2022-12-18 (×3): 1 [drp] via OPHTHALMIC

## 2022-12-18 MED ORDER — STERILE WATER FOR IRRIGATION IR SOLN
Status: DC | PRN
  Administered 2022-12-18: 250 mL

## 2022-12-18 MED ORDER — TROPICAMIDE 1 % OP SOLN
1.0000 [drp] | OPHTHALMIC | Status: AC | PRN
  Administered 2022-12-18 (×3): 1 [drp] via OPHTHALMIC

## 2022-12-18 SURGICAL SUPPLY — 15 items
CATARACT SUITE SIGHTPATH (MISCELLANEOUS) ×1 IMPLANT
CLOTH BEACON ORANGE TIMEOUT ST (SAFETY) ×2 IMPLANT
EYE SHIELD UNIVERSAL CLEAR (GAUZE/BANDAGES/DRESSINGS) IMPLANT
FEE CATARACT SUITE SIGHTPATH (MISCELLANEOUS) ×2 IMPLANT
GLOVE BIOGEL PI IND STRL 7.0 (GLOVE) ×4 IMPLANT
LENS IOL RAYNER 21.0 (Intraocular Lens) ×1 IMPLANT
LENS IOL RAYONE EMV 21.0 (Intraocular Lens) IMPLANT
NDL HYPO 18GX1.5 BLUNT FILL (NEEDLE) ×2 IMPLANT
NEEDLE HYPO 18GX1.5 BLUNT FILL (NEEDLE) ×1 IMPLANT
PAD ARMBOARD 7.5X6 YLW CONV (MISCELLANEOUS) ×2 IMPLANT
RING MALYGIN 7.0 (MISCELLANEOUS) IMPLANT
SYR TB 1ML LL NO SAFETY (SYRINGE) ×2 IMPLANT
TAPE PAPER 2X10 WHT MICROPORE (GAUZE/BANDAGES/DRESSINGS) IMPLANT
TAPE SURG TRANSPORE 1 IN (GAUZE/BANDAGES/DRESSINGS) IMPLANT
WATER STERILE IRR 250ML POUR (IV SOLUTION) ×2 IMPLANT

## 2022-12-18 NOTE — Anesthesia Postprocedure Evaluation (Signed)
Anesthesia Post Note  Patient: Traci Parker  Procedure(s) Performed: CATARACT EXTRACTION PHACO AND INTRAOCULAR LENS PLACEMENT (IOC) (Left: Eye)  Patient location during evaluation: Phase II Anesthesia Type: MAC Level of consciousness: awake Pain management: pain level controlled Vital Signs Assessment: post-procedure vital signs reviewed and stable Respiratory status: spontaneous breathing and respiratory function stable Cardiovascular status: blood pressure returned to baseline and stable Postop Assessment: no headache and no apparent nausea or vomiting Anesthetic complications: no Comments: Late entry   No notable events documented.   Last Vitals:  Vitals:   12/18/22 1151 12/18/22 1245  BP: (!) 152/78 (!) 143/85  Pulse: 97 80  Resp: 20 (!) 23  Temp: 36.9 C 36.8 C  SpO2: 100% 100%    Last Pain:  Vitals:   12/18/22 1245  TempSrc: Oral  PainSc: 0-No pain                 Windell Norfolk

## 2022-12-18 NOTE — Anesthesia Preprocedure Evaluation (Signed)
Anesthesia Evaluation  Patient identified by MRN, date of birth, ID band Patient awake    Reviewed: Allergy & Precautions, H&P , NPO status , Patient's Chart, lab work & pertinent test results  Airway Mallampati: II  TM Distance: >3 FB Neck ROM: Full   Comment: Cervical myelopathy Dental  (+) Edentulous Upper, Edentulous Lower   Pulmonary asthma , COPD, former smoker   Pulmonary exam normal breath sounds clear to auscultation       Cardiovascular Exercise Tolerance: Good hypertension, Pt. on medications + Peripheral Vascular Disease  Normal cardiovascular exam Rhythm:Regular Rate:Normal     Neuro/Psych  PSYCHIATRIC DISORDERS  Depression     Neuromuscular disease CVA (right sided wakness), Residual Symptoms    GI/Hepatic Neg liver ROS,GERD  Controlled,,  Endo/Other  diabetes, Well Controlled, Type 2, Oral Hypoglycemic Agents    Renal/GU Renal InsufficiencyRenal disease  negative genitourinary   Musculoskeletal  (+) Arthritis , Osteoarthritis,    Abdominal   Peds negative pediatric ROS (+)  Hematology  (+) Blood dyscrasia, anemia   Anesthesia Other Findings   Reproductive/Obstetrics negative OB ROS                             Anesthesia Physical Anesthesia Plan  ASA: 3  Anesthesia Plan: MAC   Post-op Pain Management: Minimal or no pain anticipated   Induction: Intravenous  PONV Risk Score and Plan: 0 and Treatment may vary due to age or medical condition  Airway Management Planned: Nasal Cannula and Natural Airway  Additional Equipment:   Intra-op Plan:   Post-operative Plan:   Informed Consent: I have reviewed the patients History and Physical, chart, labs and discussed the procedure including the risks, benefits and alternatives for the proposed anesthesia with the patient or authorized representative who has indicated his/her understanding and acceptance.       Plan  Discussed with: CRNA and Surgeon  Anesthesia Plan Comments:        Anesthesia Quick Evaluation  

## 2022-12-18 NOTE — Transfer of Care (Signed)
Immediate Anesthesia Transfer of Care Note  Patient: Traci Parker  Procedure(s) Performed: CATARACT EXTRACTION PHACO AND INTRAOCULAR LENS PLACEMENT (IOC) (Left: Eye)  Patient Location: Short Stay  Anesthesia Type:MAC  Level of Consciousness: awake, alert , and oriented  Airway & Oxygen Therapy: Patient Spontanous Breathing  Post-op Assessment: Report given to RN and Post -op Vital signs reviewed and stable  Post vital signs: Reviewed and stable  Last Vitals:  Vitals Value Taken Time  BP 143/85 12/18/22 1245  Temp 36.8 C 12/18/22 1245  Pulse 80 12/18/22 1245  Resp 23 12/18/22 1245  SpO2 100 % 12/18/22 1245    Last Pain:  Vitals:   12/18/22 1245  TempSrc: Oral  PainSc: 0-No pain         Complications: No notable events documented.

## 2022-12-18 NOTE — Op Note (Signed)
Date of procedure: 12/18/22  Pre-operative diagnosis: Visually significant age-related combined cataract, Left Eye (H25.812)  Post-operative diagnosis: Visually significant age-related combined cataract, Left Eye (H25.812)  Procedure: Removal of cataract via phacoemulsification and insertion of intra-ocular lens Rayner RayOne EMV [id number] +21.0D into the capsular bag of the Left Eye  Attending surgeon: Rudy Jew. [place] Gipe, MD, MA  Anesthesia: MAC, Topical Akten  Complications: None  Estimated Blood Loss: <72mL (minimal)  Specimens: None  Implants: As above  Indications:  Visually significant age-related cataract, Left Eye  Procedure:  The patient was seen and identified in the pre-operative area. The operative eye was identified and dilated.  The operative eye was marked.  Topical anesthesia was administered to the operative eye.     The patient was then to the operative suite and placed in the supine position.  A timeout was performed confirming the patient, procedure to be performed, and all other relevant information.   The patient's face was prepped and draped in the usual fashion for intra-ocular surgery.  A lid speculum was placed into the operative eye and the surgical microscope moved into place and focused.  An inferotemporal paracentesis was created using a 20 gauge paracentesis blade.  Shugarcaine was injected into the anterior chamber.  Viscoelastic was injected into the anterior chamber.  A temporal clear-corneal main wound incision was created using a 2.44mm microkeratome.  A continuous curvilinear capsulorrhexis was initiated using an irrigating cystitome and completed using capsulorrhexis forceps.  Hydrodissection and hydrodeliniation were performed.  Viscoelastic was injected into the anterior chamber.  A phacoemulsification handpiece and a chopper as a second instrument were used to remove the nucleus and epinucleus. The irrigation/aspiration handpiece was used to remove any  remaining cortical material.   The capsular bag was reinflated with viscoelastic, checked, and found to be intact.  The intraocular lens was inserted into the capsular bag.  The irrigation/aspiration handpiece was used to remove any remaining viscoelastic.  The clear corneal wound and paracentesis wounds were then hydrated and checked with Weck-Cels to be watertight. Maxitrol drops were instilled into the operative eye. The lid-speculum was removed.  The drape was removed.  The patient's face was cleaned with a wet and dry 4x4.    A clear shield was taped over the eye. The patient was taken to the post-operative care unit in good condition, having tolerated the procedure well.  Post-Op Instructions: The patient will follow up at [facility] for a same day post-operative evaluation and will receive all other orders and instructions.

## 2022-12-18 NOTE — Interval H&P Note (Signed)
History and Physical Interval Note:  12/18/2022 12:16 PM  Traci Parker  has presented today for surgery, with the diagnosis of combined forms age related cataract; LEFT.  The various methods of treatment have been discussed with the patient and family. After consideration of risks, benefits and other options for treatment, the patient has consented to  Procedure(s) with comments: CATARACT EXTRACTION PHACO AND INTRAOCULAR LENS PLACEMENT (IOC) (Left) - CDE: as a surgical intervention.  The patient's history has been reviewed, patient examined, no change in status, stable for surgery.  I have reviewed the patient's chart and labs.  Questions were answered to the patient's satisfaction.     [name]

## 2022-12-18 NOTE — Discharge Instructions (Signed)
Please discharge patient when stable, will follow up today with Dr. Author Hatlestad at the [facility] office immediately following discharge.  Leave shield in place until visit.  All paperwork with discharge instructions will be given at the office.  [facility] Address:  [address]  [place], [place] [address]  

## 2022-12-22 ENCOUNTER — Encounter (HOSPITAL_COMMUNITY): Payer: Self-pay | Admitting: Ophthalmology

## 2022-12-22 ENCOUNTER — Inpatient Hospital Stay: Payer: Medicare Other

## 2022-12-22 VITALS — BP 134/75 | HR 64 | Temp 98.3°F | Resp 18

## 2022-12-22 DIAGNOSIS — Z87891 Personal history of nicotine dependence: Secondary | ICD-10-CM | POA: Diagnosis not present

## 2022-12-22 DIAGNOSIS — Z7901 Long term (current) use of anticoagulants: Secondary | ICD-10-CM | POA: Diagnosis not present

## 2022-12-22 DIAGNOSIS — Z79899 Other long term (current) drug therapy: Secondary | ICD-10-CM | POA: Diagnosis not present

## 2022-12-22 DIAGNOSIS — N1832 Chronic kidney disease, stage 3b: Secondary | ICD-10-CM | POA: Diagnosis not present

## 2022-12-22 DIAGNOSIS — D631 Anemia in chronic kidney disease: Secondary | ICD-10-CM | POA: Diagnosis not present

## 2022-12-22 DIAGNOSIS — Z792 Long term (current) use of antibiotics: Secondary | ICD-10-CM | POA: Diagnosis not present

## 2022-12-22 DIAGNOSIS — Z86718 Personal history of other venous thrombosis and embolism: Secondary | ICD-10-CM | POA: Diagnosis not present

## 2022-12-22 DIAGNOSIS — E611 Iron deficiency: Secondary | ICD-10-CM | POA: Diagnosis not present

## 2022-12-22 DIAGNOSIS — Z9049 Acquired absence of other specified parts of digestive tract: Secondary | ICD-10-CM | POA: Diagnosis not present

## 2022-12-22 DIAGNOSIS — Z7982 Long term (current) use of aspirin: Secondary | ICD-10-CM | POA: Diagnosis not present

## 2022-12-22 DIAGNOSIS — N183 Chronic kidney disease, stage 3 unspecified: Secondary | ICD-10-CM

## 2022-12-22 DIAGNOSIS — E538 Deficiency of other specified B group vitamins: Secondary | ICD-10-CM | POA: Diagnosis not present

## 2022-12-22 MED ORDER — ACETAMINOPHEN 325 MG PO TABS
650.0000 mg | ORAL_TABLET | Freq: Once | ORAL | Status: AC
  Administered 2022-12-22: 650 mg via ORAL
  Filled 2022-12-22: qty 2

## 2022-12-22 MED ORDER — SODIUM CHLORIDE 0.9 % IV SOLN
100.0000 mg | Freq: Once | INTRAVENOUS | Status: AC
  Administered 2022-12-22: 100 mg via INTRAVENOUS
  Filled 2022-12-22: qty 100

## 2022-12-22 MED ORDER — CETIRIZINE HCL 10 MG PO TABS
10.0000 mg | ORAL_TABLET | Freq: Once | ORAL | Status: AC
  Administered 2022-12-22: 10 mg via ORAL
  Filled 2022-12-22: qty 1

## 2022-12-22 MED ORDER — SODIUM CHLORIDE 0.9 % IV SOLN: Freq: Once | INTRAVENOUS | Status: AC

## 2022-12-22 MED ORDER — FAMOTIDINE IN NACL 20-0.9 MG/50ML-% IV SOLN
20.0000 mg | Freq: Once | INTRAVENOUS | Status: AC
  Administered 2022-12-22: 20 mg via INTRAVENOUS
  Filled 2022-12-22: qty 50

## 2022-12-22 NOTE — Patient Instructions (Signed)
[facility]  Discharge Instructions: Thank you for choosing [facility] to provide your oncology and hematology care.  If you have a lab appointment with the Cancer Center - please note that after April 8th, 2024, all labs will be drawn in the cancer center.  You do not have to check in or register with the main entrance as you have in the past but will complete your check-in in the cancer center.  Wear comfortable clothing and clothing appropriate for easy access to any Portacath or PICC line.   We strive to give you quality time with your provider. You may need to reschedule your appointment if you arrive late (15 or more minutes).  Arriving late affects you and other patients whose appointments are after yours.  Also, if you miss three or more appointments without notifying the office, you may be dismissed from the clinic at the provider's discretion.      For prescription refill requests, have your pharmacy contact our office and allow 72 hours for refills to be completed.    Iron Sucrose Injection What is this medication? IRON SUCROSE (EYE ern SOO krose) treats low levels of iron (iron deficiency anemia) in people with kidney disease. Iron is a mineral that plays an important role in making red blood cells, which carry oxygen from your lungs to the rest of your body. This medicine may be used for other purposes; ask your health care provider or pharmacist if you have questions. COMMON BRAND NAME(S): Venofer What should I tell my care team before I take this medication? They need to know if you have any of these conditions: Anemia not caused by low iron levels Heart disease High levels of iron in the blood Kidney disease Liver disease An unusual or allergic reaction to iron, other medications, foods, dyes, or preservatives Pregnant or trying to get pregnant Breastfeeding How should I use this medication? This medication is for infusion into a vein. It  is given in a hospital or clinic setting. Talk to your care team about the use of this medication in children. While this medication may be prescribed for children as young as 2 years for selected conditions, precautions do apply. Overdosage: If you think you have taken too much of this medicine contact a poison control center or emergency room at once. NOTE: This medicine is only for you. Do not share this medicine with others. What if I miss a dose? Keep appointments for follow-up doses. It is important not to miss your dose. Call your care team if you are unable to keep an appointment. What may interact with this medication? Do not take this medication with any of the following: Deferoxamine Dimercaprol Other iron products This medication may also interact with the following: Chloramphenicol Deferasirox This list may not describe all possible interactions. Give your health care provider a list of all the medicines, herbs, non-prescription drugs, or dietary supplements you use. Also tell them if you smoke, drink alcohol, or use illegal drugs. Some items may interact with your medicine. What should I watch for while using this medication? Visit your care team regularly. Tell your care team if your symptoms do not start to get better or if they get worse. You may need blood work done while you are taking this medication. You may need to follow a special diet. Talk to your care team. Foods that contain iron include: whole grains/cereals, dried fruits, beans, or peas, leafy green vegetables, and organ meats (  liver, kidney). What side effects may I notice from receiving this medication? Side effects that you should report to your care team as soon as possible: Allergic reactions--skin rash, itching, hives, swelling of the face, lips, tongue, or throat Low blood pressure--dizziness, feeling faint or lightheaded, blurry vision Shortness of breath Side effects that usually do not require medical  attention (report to your care team if they continue or are bothersome): Flushing Headache Joint pain Muscle pain Nausea Pain, redness, or irritation at injection site This list may not describe all possible side effects. Call your doctor for medical advice about side effects. You may report side effects to FDA at 1-800-FDA-1088. Where should I keep my medication? This medication is given in a hospital or clinic and will not be stored at home. NOTE: This sheet is a summary. It may not cover all possible information. If you have questions about this medicine, talk to your doctor, pharmacist, or health care provider.  2024 Elsevier/Gold Standard (2022-01-07 00:00:00)    To help prevent nausea and vomiting after your treatment, we encourage you to take your nausea medication as directed.  BELOW ARE SYMPTOMS THAT SHOULD BE REPORTED IMMEDIATELY: *FEVER GREATER THAN 100.4 F (38 C) OR HIGHER *CHILLS OR SWEATING *NAUSEA AND VOMITING THAT IS NOT CONTROLLED WITH YOUR NAUSEA MEDICATION *UNUSUAL SHORTNESS OF BREATH *UNUSUAL BRUISING OR BLEEDING *URINARY PROBLEMS (pain or burning when urinating, or frequent urination) *BOWEL PROBLEMS (unusual diarrhea, constipation, pain near the anus) TENDERNESS IN MOUTH AND THROAT WITH OR WITHOUT PRESENCE OF ULCERS (sore throat, sores in mouth, or a toothache) UNUSUAL RASH, SWELLING OR PAIN  UNUSUAL VAGINAL DISCHARGE OR ITCHING   Items with * indicate a potential emergency and should be followed up as soon as possible or go to the Emergency Department if any problems should occur.  Please show the CHEMOTHERAPY ALERT CARD or IMMUNOTHERAPY ALERT CARD at check-in to the Emergency Department and triage nurse.  Should you have questions after your visit or need to cancel or reschedule your appointment, please contact [facility] [phone]  and follow the prompts.  Office hours are 8:00 a.m. to 4:30 p.m. Monday - Friday. Please note that  voicemails left after 4:00 p.m. may not be returned until the following business day.  We are closed weekends and major holidays. You have access to a nurse at all times for urgent questions. Please call the main number to the clinic [phone] and follow the prompts.  For any non-urgent questions, you may also contact your provider using MyChart. We now offer e-Visits for anyone 18 and older to request care online for non-urgent symptoms. For details visit [email or web address].   Also download the MyChart app! Go to the app store, search "MyChart", open the app, select [facility], and log in with your MyChart username and password.   

## 2022-12-22 NOTE — Progress Notes (Signed)
Patient presents today for iron infusion.  Patient is in satisfactory condition with no new complaints voiced.  Vital signs are stable.  We will proceed with infusion per provider orders. 

## 2022-12-22 NOTE — Progress Notes (Signed)
Patient tolerated iron infusion well with no complaints voiced.  Patient left via wheelchair with [facility] staff in stable condition.  Vital signs stable at discharge.  Follow up as scheduled.

## 2022-12-29 NOTE — Progress Notes (Unsigned)
[facility] [address] [place], [place] [address]   CLINIC:  Medical Oncology/Hematology  PCP:  Sharee Holster, NP  [address] [place] [address] [phone]   REASON FOR VISIT:  Follow-up for anemia due to CKD stage IIIb   CURRENT THERAPY: IV iron & Retacrit   INTERVAL HISTORY:   Traci Parker 77 y.o. female returns for routine follow-up of anemia.  She was last seen by Rojelio Brenner PA-C on 11/17/2022.    At last visit, she was restarted on Retacrit 10,000 units every other week with Venofer 100 mg x 3 doses given on alternating weeks from Retacrit injections.  She reports feeling improved energy since starting this treatment regimen.  She is tolerating Retacrit well and reports baseline blood pressure (BP today 151/81); no symptoms of DVT or PE.   She has not noticed any blood loss such as hematemesis, hematochezia, melena, or epistaxis.  No pica, restless legs, or headaches.  No chest pain, dyspnea on exertion, lightheadedness, or syncope.  She continues to take ferrous sulfate and folic acid every other day.  B12 had been discontinued by SNF.  She has 80% energy and 90% appetite. She endorses that she is maintaining a stable weight.  ASSESSMENT & PLAN:  1.  Normocytic anemia, secondary to CKD and relative iron deficiency - Etiology is from combination of CKD and relative iron deficiency. - Initial work-up included SPEP which was negative.  Stool was negative for occult blood.  Hemoglobin electrophoresis showed severe microcytosis.  She could have underlying thalassemia/sickle cell trait. - Last colonoscopy was in June 2019 in [place], 3 polyps removed. - She has a history of needing a blood transfusion in the early 2000's. - She was previously on Epogen injections, but has not required Retacrit since 02/11/2021  - Seen today due to acute drop in hemoglobin: Hemoglobin was 10.5 (09/10/2022), dropped to Hgb 9.1 (11/02/2022) and Hgb 8.7 (11/12/2022). - At  visit on 11/17/2022 she was restarted on Retacrit 10,000 units every other week with Venofer 100 mg x 3 doses given on alternating weeks from Retacrit injections - She continues to take daily iron tablet with stool softener - She denies any bright red blood per rectum or melena      - Labs today (12/30/2022): Hgb 11.5/MCV 93.9.  Ferritin 511, iron saturation 39% with low TIBC 167 - PLAN: No indication for IV iron at this time.  Recommend stopping oral iron supplementation. - We will decrease frequency of Retacrit 10,000 units to be given monthly -  RTC in 12 weeks for office visit and labs    2.  CKD stage IIIa/b - Ultrasound the kidneys on 08/22/2019 showed bilateral renal atrophy, severe atrophy of the right kidney. - Most recent CMP shows baseline CKD stage IIIb - PLAN: Continue follow-up with Dr. Wolfgang Phoenix   3.  B12 and folic acid deficiency: - She previously received B12 injections monthly at SNF - She was previously taking B12 and folic acid oral supplementation every other day.  Since last visit, it looks like B12 has been removed from medication list by SNF. - Labs (11/11/2022) showed normal B12 459, normal MMA, normal folate. - PLAN: Recommend restarting vitamin B12 500 mcg EOD and continuing folic acid supplements every other day - Will recheck levels at follow-up visit in 3 months (September 2024)   4.  History of lower extremity DVT - Venous duplex (02/02/2020): Extensive acute appearing left femoropopliteal occlusive DVT extending into the calf  veins. - Considered provoked in the setting of immobility/bedbound functional status - Venous ultrasound (03/24/2022) shows a previous left-sided femoral, popliteal, and DVT from 2021 has completely resolved - Eliquis was discontinued by her PCP, placed on aspirin and statin - No current signs or symptoms of recurrent DVT     PLAN SUMMARY: >> CBC + Retacrit every 4 weeks >> Labs in 3 months = CBC/D, CMP, ferritin, iron/TIBC, B12, MMA,  folate >> OFFICE visit in 3 months (1 week after labs)     REVIEW OF SYSTEMS:  Review of Systems  Constitutional:  Negative for appetite change, chills, diaphoresis, fatigue, fever and unexpected weight change.  HENT:   Negative for lump/mass and nosebleeds.   Eyes:  Negative for eye problems.  Respiratory:  Negative for cough, hemoptysis and shortness of breath.   Cardiovascular:  Negative for chest pain, leg swelling and palpitations.  Gastrointestinal:  Negative for abdominal pain, blood in stool, constipation, diarrhea, nausea and vomiting.  Genitourinary:  Negative for hematuria.   Skin: Negative.   Neurological:  Positive for numbness. Negative for dizziness, headaches and light-headedness.  Hematological:  Does not bruise/bleed easily.     PHYSICAL EXAM:  ECOG PERFORMANCE STATUS: 3 - Symptomatic, >50% confined to bed There were no vitals filed for this visit. There were no vitals filed for this visit. Physical Exam Constitutional:      Appearance: Normal appearance.     Comments: Presents in wheelchair  HENT:     Head: Normocephalic and atraumatic.     Mouth/Throat:     Mouth: Mucous membranes are moist.  Eyes:     Extraocular Movements: Extraocular movements intact.     Pupils: Pupils are equal, round, and reactive to light.  Cardiovascular:     Rate and Rhythm: Normal rate and regular rhythm.     Pulses: Normal pulses.     Heart sounds: Normal heart sounds.  Pulmonary:     Effort: Pulmonary effort is normal.     Breath sounds: Normal breath sounds.  Abdominal:     General: Bowel sounds are normal.     Palpations: Abdomen is soft.     Tenderness: There is no abdominal tenderness.  Musculoskeletal:        General: No swelling.     Right lower leg: No edema.     Left lower leg: No edema.  Lymphadenopathy:     Cervical: No cervical adenopathy.  Skin:    General: Skin is warm and dry.  Neurological:     General: No focal deficit present.     Mental Status:  She is alert and oriented to person, place, and time.  Psychiatric:        Mood and Affect: Mood normal.        Behavior: Behavior normal.    PAST MEDICAL/SURGICAL HISTORY:  Past Medical History:  Diagnosis Date   Allergy    Anemia    pernicious anemia   Arthritis    Asthma    Blood transfusion without reported diagnosis    CKD (chronic kidney disease)    COPD (chronic obstructive pulmonary disease) (HCC) 12/23/2019   Diabetic peripheral neuropathy associated with type 2 diabetes mellitus (HCC) 04/07/2021   DM (diabetes mellitus), type 2 with peripheral vascular complications (HCC) 12/23/2019   Gastritis    GERD (gastroesophageal reflux disease)    Glaucoma    History of blood in urine    Hyperlipidemia    Hypertension    Neuropathy    both  feet   Ocular hypertension    Osteoporosis    Stroke (HCC)    Vitamin B12 deficiency anemia due to intrinsic factor deficiency    Past Surgical History:  Procedure Laterality Date   CATARACT EXTRACTION W/PHACO Right 11/27/2022   Procedure: CATARACT EXTRACTION PHACO AND INTRAOCULAR LENS PLACEMENT (IOC);  Surgeon: [name], MD;  Location: [facility];  Service: Ophthalmology;  Laterality: Right;  CDE: 15.67   CATARACT EXTRACTION W/PHACO Left 12/18/2022   Procedure: CATARACT EXTRACTION PHACO AND INTRAOCULAR LENS PLACEMENT (IOC);  Surgeon: [name], MD;  Location: [facility];  Service: Ophthalmology;  Laterality: Left;  CDE: 11.78   CHOLECYSTECTOMY  2008   COLONOSCOPY  04/30/2011   Procedure: COLONOSCOPY;  Surgeon: Malissa Hippo, MD;  Location: [facility];  Service: Endoscopy;  Laterality: N/A;   COLONOSCOPY  05/25/2012   Procedure: COLONOSCOPY;  Surgeon: Malissa Hippo, MD;  Location: [facility];  Service: Endoscopy;  Laterality: N/A;  1:25-changed to 1200 Ann to notify pt   COLONOSCOPY Bilateral 12/2017   COLONOSCOPY WITH PROPOFOL N/A 11/29/2020   Procedure: COLONOSCOPY WITH PROPOFOL;  Surgeon: Dolores Frame, MD;   Location: [facility];  Service: Gastroenterology;  Laterality: N/A;  1:35 patient is in the [facility]   ESOPHAGOGASTRODUODENOSCOPY N/A 12/07/2019   Procedure: ESOPHAGOGASTRODUODENOSCOPY (EGD);  Surgeon: Malissa Hippo, MD;  Location: [facility];  Service: Endoscopy;  Laterality: N/A;  155   GALLBLADDER SURGERY     right breast cystectomy  1988   TUBAL LIGATION  1975    SOCIAL HISTORY:  Social History   Socioeconomic History   Marital status: Divorced    Spouse name: Not on file   Number of children: 4   Years of education: 9   Highest education level: Not on file  Occupational History   Occupation: retired   Tobacco Use   Smoking status: Former    Packs/day: 1.00    Years: 40.00    Additional pack years: 0.00    Total pack years: 40.00    Types: Cigarettes    Quit date: 04/03/2011    Years since quitting: 11.7   Smokeless tobacco: Never  Vaping Use   Vaping Use: Never used  Substance and Sexual Activity   Alcohol use: No   Drug use: No   Sexual activity: Not Currently  Other Topics Concern   Not on file  Social History Narrative   [birth year] lives at [facility], [place]   Social Determinants of Health   Financial Resource Strain: Not on file  Food Insecurity: Not on file  Transportation Needs: Not on file  Physical Activity: Not on file  Stress: Not on file  Social Connections: Not on file  Intimate Partner Violence: Not on file    FAMILY HISTORY:  Family History  Problem Relation Age of Onset   Diabetes Mother    Hyperlipidemia Mother    Hypertension Mother    Heart disease Father    Hyperlipidemia Father    Diabetes Sister    Hyperlipidemia Sister    Hypertension Sister    Cancer Brother    Heart disease Brother    Hyperlipidemia Brother    Hypertension Brother    Diabetes Sister     CURRENT MEDICATIONS:  Outpatient Encounter Medications as of 12/30/2022  Medication Sig   Amino Acids-Protein Hydrolys (PRO-STAT AWC PO) Take 30 mLs  by mouth once.   amLODipine (NORVASC) 5 MG tablet Take 5 mg by mouth daily.  aspirin EC 81 MG tablet Take 81 mg by mouth daily. Swallow whole.   Calcium Carb-Cholecalciferol (CALCIUM 500 + D) 500-5 MG-MCG TABS Take 1 tablet by mouth daily.   dorzolamide (TRUSOPT) 2 % ophthalmic solution Place 1 drop into both eyes 2 (two) times daily. (0900 & 1700)   folic acid (FOLVITE) 1 MG tablet Take 1 mg by mouth every other day.   gabapentin (NEURONTIN) 100 MG capsule Take 200 mg by mouth at bedtime. (2100)   gabapentin (NEURONTIN) 100 MG capsule Take 100 mg by mouth daily.   Iron, Ferrous Sulfate, 325 (65 Fe) MG TABS Take 1 tablet by mouth daily.   losartan (COZAAR) 25 MG tablet Take 25 mg by mouth daily.   metoCLOPramide (REGLAN) 5 MG tablet Take 1 tablet (5 mg total) by mouth 3 (three) times daily before meals.   moxifloxacin (VIGAMOX) 0.5 % ophthalmic solution Place 1 drop into the right eye 3 (three) times daily.   nepafenac (ILEVRO) 0.3 % ophthalmic suspension 1 drop daily.   NON FORMULARY Diet:Regular thin liquids  ALLERGIC TO FISH   omeprazole (PRILOSEC) 40 MG capsule Take 40 mg by mouth daily at 6 (six) AM. (0600)   prednisoLONE acetate (PRED FORTE) 1 % ophthalmic suspension 1 drop in the morning, at noon, and at bedtime.   rosuvastatin (CRESTOR) 20 MG tablet Take 20 mg by mouth daily.   senna-docusate (SENOKOT S) 8.6-50 MG tablet Take 1 tablet by mouth 2 (two) times daily.   sodium chloride 1 g tablet Take 1 g by mouth 2 (two) times daily with a meal.   tobramycin (TOBREX) 0.3 % ophthalmic solution SMARTSIG:In Eye(s)   No facility-administered encounter medications on file as of 12/30/2022.    ALLERGIES:  Allergies  Allergen Reactions   Latex Rash   Levofloxacin Nausea And Vomiting   Neomycin-Bacitracin-Polymyxin  [Bacitracin-Neomycin-Polymyxin] Rash   Other Swelling and Rash    TOMATO  SOUP-PER MAR   Prednisone Hives, Itching, Rash and Other (See Comments)   Fish Allergy     rash    Shellfish Allergy Hives   Neosporin [Neomycin-Polymyxin-Gramicidin] Itching and Rash    LABORATORY DATA:  I have reviewed the labs as listed.  CBC    Component Value Date/Time   WBC 7.0 12/15/2022 0926   RBC 3.73 (L) 12/15/2022 0926   HGB 11.0 (L) 12/15/2022 0926   HCT 35.3 (L) 12/15/2022 0926   PLT 147 (L) 12/15/2022 0926   MCV 94.6 12/15/2022 0926   MCH 29.5 12/15/2022 0926   MCHC 31.2 12/15/2022 0926   RDW 13.7 12/15/2022 0926   LYMPHSABS 2.0 09/10/2022 1132   MONOABS 0.4 09/10/2022 1132   EOSABS 0.4 09/10/2022 1132   BASOSABS 0.0 09/10/2022 1132      Latest Ref Rng & Units 11/06/2022    8:00 AM 11/02/2022    8:09 AM 09/10/2022   11:32 AM  CMP  Glucose 70 - 99 mg/dL  78  191   BUN 8 - 23 mg/dL  19  19   Creatinine 4.78 - 1.00 mg/dL  2.95  6.21   Sodium 308 - 145 mmol/L  135  133   Potassium 3.5 - 5.1 mmol/L  3.9  4.6   Chloride 98 - 111 mmol/L  102  100   CO2 22 - 32 mmol/L  25  25   Calcium 8.7 - 10.3 mg/dL 9.4  8.7  9.2   Total Protein 6.5 - 8.1 g/dL  6.0  7.4   Total Bilirubin  0.3 - 1.2 mg/dL  0.6  0.5   Alkaline Phos 38 - 126 U/L  61  79   AST 15 - 41 U/L  7  12   ALT 0 - 44 U/L  7  12     DIAGNOSTIC IMAGING:  I have independently reviewed the relevant imaging and discussed with the patient.   WRAP UP:  All questions were answered. The patient knows to call the clinic with any problems, questions or concerns.  Medical decision making: Moderate  Time spent on visit: I spent 20 minutes counseling the patient face to face. The total time spent in the appointment was 30 minutes and more than 50% was on counseling.  Carnella Guadalajara, PA-C  12/30/2022 9:08 AM

## 2022-12-30 ENCOUNTER — Inpatient Hospital Stay (HOSPITAL_BASED_OUTPATIENT_CLINIC_OR_DEPARTMENT_OTHER): Payer: Medicare Other | Admitting: Physician Assistant

## 2022-12-30 ENCOUNTER — Inpatient Hospital Stay: Payer: Medicare Other

## 2022-12-30 VITALS — BP 151/81 | HR 68 | Temp 97.8°F | Resp 16

## 2022-12-30 DIAGNOSIS — N183 Chronic kidney disease, stage 3 unspecified: Secondary | ICD-10-CM

## 2022-12-30 DIAGNOSIS — D631 Anemia in chronic kidney disease: Secondary | ICD-10-CM

## 2022-12-30 DIAGNOSIS — Z7901 Long term (current) use of anticoagulants: Secondary | ICD-10-CM | POA: Diagnosis not present

## 2022-12-30 DIAGNOSIS — E538 Deficiency of other specified B group vitamins: Secondary | ICD-10-CM

## 2022-12-30 DIAGNOSIS — Z9049 Acquired absence of other specified parts of digestive tract: Secondary | ICD-10-CM | POA: Diagnosis not present

## 2022-12-30 DIAGNOSIS — Z87891 Personal history of nicotine dependence: Secondary | ICD-10-CM | POA: Diagnosis not present

## 2022-12-30 DIAGNOSIS — N1832 Chronic kidney disease, stage 3b: Secondary | ICD-10-CM | POA: Diagnosis not present

## 2022-12-30 DIAGNOSIS — Z86718 Personal history of other venous thrombosis and embolism: Secondary | ICD-10-CM | POA: Diagnosis not present

## 2022-12-30 DIAGNOSIS — Z7982 Long term (current) use of aspirin: Secondary | ICD-10-CM | POA: Diagnosis not present

## 2022-12-30 DIAGNOSIS — E611 Iron deficiency: Secondary | ICD-10-CM | POA: Diagnosis not present

## 2022-12-30 DIAGNOSIS — Z79899 Other long term (current) drug therapy: Secondary | ICD-10-CM | POA: Diagnosis not present

## 2022-12-30 DIAGNOSIS — Z792 Long term (current) use of antibiotics: Secondary | ICD-10-CM | POA: Diagnosis not present

## 2022-12-30 LAB — CBC WITH DIFFERENTIAL/PLATELET
Abs Immature Granulocytes: 0 10*3/uL (ref 0.00–0.07)
Basophils Absolute: 0 10*3/uL (ref 0.0–0.1)
Basophils Relative: 1 %
Eosinophils Absolute: 0.6 10*3/uL — ABNORMAL HIGH (ref 0.0–0.5)
Eosinophils Relative: 11 %
HCT: 37.2 % (ref 36.0–46.0)
Hemoglobin: 11.5 g/dL — ABNORMAL LOW (ref 12.0–15.0)
Immature Granulocytes: 0 %
Lymphocytes Relative: 29 %
Lymphs Abs: 1.7 10*3/uL (ref 0.7–4.0)
MCH: 29 pg (ref 26.0–34.0)
MCHC: 30.9 g/dL (ref 30.0–36.0)
MCV: 93.9 fL (ref 80.0–100.0)
Monocytes Absolute: 0.4 10*3/uL (ref 0.1–1.0)
Monocytes Relative: 7 %
Neutro Abs: 3.1 10*3/uL (ref 1.7–7.7)
Neutrophils Relative %: 52 %
Platelets: 166 10*3/uL (ref 150–400)
RBC: 3.96 MIL/uL (ref 3.87–5.11)
RDW: 14.1 % (ref 11.5–15.5)
WBC: 5.8 10*3/uL (ref 4.0–10.5)
nRBC: 0 % (ref 0.0–0.2)

## 2022-12-30 LAB — IRON AND TIBC
Iron: 65 ug/dL (ref 28–170)
Saturation Ratios: 39 % — ABNORMAL HIGH (ref 10.4–31.8)
TIBC: 167 ug/dL — ABNORMAL LOW (ref 250–450)
UIBC: 102 ug/dL

## 2022-12-30 LAB — FERRITIN: Ferritin: 511 ng/mL — ABNORMAL HIGH (ref 11–307)

## 2022-12-30 MED ORDER — CYANOCOBALAMIN 500 MCG PO TABS
500.0000 ug | ORAL_TABLET | ORAL | 1 refills | Status: DC
Start: 2022-12-30 — End: 2023-11-25

## 2022-12-30 MED ORDER — FOLIC ACID 400 MCG PO TABS
400.0000 ug | ORAL_TABLET | ORAL | 1 refills | Status: AC
Start: 2022-12-30 — End: ?

## 2022-12-30 NOTE — Progress Notes (Signed)
Hemoglobin is 11.5 today. No injection needed per protocol.

## 2022-12-30 NOTE — Patient Instructions (Addendum)
[facility] **VISIT SUMMARY & IMPORTANT INSTRUCTIONS **   You were seen today by Rojelio Brenner PA-C for your anemia.  Your blood levels look much better now! You do not need any IV iron at this time. We will schedule you for blood check and Retacrit injection every 4 weeks. You can STOP taking iron supplement. You should CONTINUE taking folic acid 400 mcg every other day. You should RESTART taking vitamin B12 (cyanocobalamin) 500 mcg every other day.  FOLLOW-UP APPOINTMENT: Office visit in 3 months  ** Thank you for trusting me with your healthcare!  I strive to provide all of my patients with quality care at each visit.  If you receive a survey for this visit, I would be so grateful to you for taking the time to provide feedback.  Thank you in advance!  ~ Antwuan Eckley                   Dr. Doreatha Massed   &   Rojelio Brenner, PA-C   - - - - - - - - - - - - - - - - - -    Thank you for choosing [facility] at [facility] to provide your oncology and hematology care.  To afford each patient quality time with our provider, please arrive at least 15 minutes before your scheduled appointment time.   If you have a lab appointment with the Cancer Center please come in thru the Main Entrance and check in at the main information desk.  You need to re-schedule your appointment should you arrive 10 or more minutes late.  We strive to give you quality time with our providers, and arriving late affects you and other patients whose appointments are after yours.  Also, if you no show three or more times for appointments you may be dismissed from the clinic at the providers discretion.     Again, thank you for choosing [facility].  Our hope is that these requests will decrease the amount of time that you wait before being seen by our physicians.       _____________________________________________________________  Should you  have questions after your visit to [facility], please contact our office at [phone] and follow the prompts.  Our office hours are 8:00 a.m. and 4:30 p.m. Monday - Friday.  Please note that voicemails left after 4:00 p.m. may not be returned until the following business day.  We are closed weekends and major holidays.  You do have access to a nurse 24-7, just call the main number to the clinic [phone] and do not press any options, hold on the line and a nurse will answer the phone.    For prescription refill requests, have your pharmacy contact our office and allow 72 hours.

## 2022-12-31 ENCOUNTER — Other Ambulatory Visit (HOSPITAL_COMMUNITY)
Admission: RE | Admit: 2022-12-31 | Discharge: 2022-12-31 | Disposition: A | Discharge status: Home / Self Care | Payer: Medicare Other | Source: Skilled Nursing Facility | Attending: Adult Health | Admitting: Adult Health

## 2022-12-31 DIAGNOSIS — N183 Chronic kidney disease, stage 3 unspecified: Secondary | ICD-10-CM | POA: Insufficient documentation

## 2022-12-31 DIAGNOSIS — E1122 Type 2 diabetes mellitus with diabetic chronic kidney disease: Secondary | ICD-10-CM | POA: Diagnosis not present

## 2022-12-31 DIAGNOSIS — I1310 Hypertensive heart and chronic kidney disease without heart failure, with stage 1 through stage 4 chronic kidney disease, or unspecified chronic kidney disease: Secondary | ICD-10-CM | POA: Diagnosis not present

## 2022-12-31 DIAGNOSIS — D631 Anemia in chronic kidney disease: Secondary | ICD-10-CM | POA: Insufficient documentation

## 2022-12-31 LAB — VITAMIN B12: Vitamin B-12: 829 pg/mL (ref 180–914)

## 2022-12-31 LAB — CBC
HCT: 34.9 % — ABNORMAL LOW (ref 36.0–46.0)
Hemoglobin: 10.9 g/dL — ABNORMAL LOW (ref 12.0–15.0)
MCH: 29.3 pg (ref 26.0–34.0)
MCHC: 31.2 g/dL (ref 30.0–36.0)
MCV: 93.8 fL (ref 80.0–100.0)
Platelets: 167 10*3/uL (ref 150–400)
RBC: 3.72 MIL/uL — ABNORMAL LOW (ref 3.87–5.11)
RDW: 14.1 % (ref 11.5–15.5)
WBC: 5.4 10*3/uL (ref 4.0–10.5)
nRBC: 0 % (ref 0.0–0.2)

## 2022-12-31 LAB — FOLATE: Folate: 39.3 ng/mL (ref >5.9)

## 2022-12-31 LAB — VITAMIN D 25 HYDROXY (VIT D DEFICIENCY, FRACTURES): Vit D, 25-Hydroxy: 59.03 ng/mL (ref 30–100)

## 2022-12-31 LAB — HEMOGLOBIN A1C
Hgb A1c MFr Bld: 4.9 % (ref 4.8–5.6)
Mean Plasma Glucose: 93.93 mg/dL

## 2023-01-06 ENCOUNTER — Other Ambulatory Visit: Payer: Medicare Other

## 2023-01-13 ENCOUNTER — Other Ambulatory Visit: Payer: Medicare Other

## 2023-01-13 ENCOUNTER — Encounter: Payer: Self-pay | Admitting: Internal Medicine

## 2023-01-13 ENCOUNTER — Non-Acute Institutional Stay (SKILLED_NURSING_FACILITY): Payer: Medicare Other | Admitting: Internal Medicine

## 2023-01-13 DIAGNOSIS — I10 Essential (primary) hypertension: Secondary | ICD-10-CM | POA: Diagnosis not present

## 2023-01-13 DIAGNOSIS — D631 Anemia in chronic kidney disease: Secondary | ICD-10-CM

## 2023-01-13 DIAGNOSIS — E1142 Type 2 diabetes mellitus with diabetic polyneuropathy: Secondary | ICD-10-CM | POA: Diagnosis not present

## 2023-01-13 DIAGNOSIS — I700 Atherosclerosis of aorta: Secondary | ICD-10-CM

## 2023-01-13 DIAGNOSIS — N183 Chronic kidney disease, stage 3 unspecified: Secondary | ICD-10-CM

## 2023-01-13 NOTE — Assessment & Plan Note (Signed)
She continues to have atypical neuropathic symptoms in her toes despite Gabapentin. Symptoms sound more arthritic than neuropathic. Current A1c is prediabetic at 4.9%.  No change indicated; monitor A1c every 6-12 months.

## 2023-01-13 NOTE — Progress Notes (Signed)
NURSING HOME LOCATION:  [facility] ROOM NUMBER:  [id number]  CODE STATUS:  DNR  PCP:  Synthia Innocent NP  This is a nursing facility follow up visit of chronic medical diagnoses & to document compliance with Regulation 483.30 (c) in The Long Term Care Survey Manual Phase 2 which mandates caregiver visit ( visits can alternate among physician, PA or NP as per statutes) within 10 days of 30 days / 60 days/ 90 days post admission to SNF date    Interim medical record and care since last SNF visit was updated with review of diagnostic studies and change in clinical status since last visit were documented.  HPI: She is a permanent resident of this facility with medical diagnoses of COPD, diabetes complicated by peripheral neuropathy, GERD, glaucoma, history of asthma, history of stroke, essential hypertension, dyslipidemia, and history of B12 deficiency. Current H/H is 10.9/34.9, down from prior values of 11.5/37.2.  Iron, folate, and B12 levels are normal.  A1c is prediabetic at 4.9%.  Review of systems: She is a delightful individual, alert and interactive.  She states that "I am fine."  Active complaints include the neuropathy in her toes.  She stated "I can tell when it is going to rain."  She continues to have profound cold intolerance requiring multiple layers of warm clothes.  She relates her cold intolerance to "2 kinds of anemia."  She denies any bleeding dyscrasias.  Constitutional: No fever, significant weight change, fatigue  Eyes: No redness, discharge, pain, vision change ENT/mouth: No nasal congestion,  purulent discharge, earache, change in hearing, sore throat  Cardiovascular: No chest pain, palpitations, paroxysmal nocturnal dyspnea, claudication, edema  Respiratory: No cough, sputum production, hemoptysis, DOE, significant snoring, apnea   Gastrointestinal: No heartburn, dysphagia, abdominal pain, nausea /vomiting, rectal bleeding, melena, change in  bowels Genitourinary: No dysuria, hematuria, pyuria, incontinence, nocturia Musculoskeletal: No joint stiffness, joint swelling, weakness, pain Dermatologic: No rash, pruritus, change in appearance of skin Neurologic: No dizziness, headache, syncope, seizures, numbness, tingling Psychiatric: No significant anxiety, depression, insomnia, anorexia Endocrine: No change in hair/skin/nails, excessive thirst, excessive hunger, excessive urination  Hematologic/lymphatic: No significant bruising, lymphadenopathy, abnormal bleeding Allergy/immunology: No itchy/watery eyes, significant sneezing, urticaria, angioedema  Physical exam:  Pertinent or positive findings: She is always immaculately procured.  The right lacrimal gland is more prominent than the left.  There is minimal exotropia of the right eye.  She is completely edentulous.  There is a very subtle increase in second heart sound.  She has minor expiratory rhonchi.  Pedal pulses are decreased.  Limbs are thin. General appearance: Adequately nourished; no acute distress, increased work of breathing is present.   Lymphatic: No lymphadenopathy about the head, neck, axilla. Eyes: No conjunctival inflammation or lid edema is present. There is no scleral icterus. Ears:  External ear exam shows no significant lesions or deformities.   Nose:  External nasal examination shows no deformity or inflammation. Nasal mucosa are pink and moist without lesions, exudates Oral exam:  Lips and gums are healthy appearing. There is no oropharyngeal erythema or exudate. Neck:  No thyromegaly, masses, tenderness noted.    Heart:  Normal rate and regular rhythm. S1 and S2 normal without gallop, murmur, click, rub .  Lungs: Chest clear to auscultation without wheezes, rhonchi, rales, rubs. Abdomen: Bowel sounds are normal. Abdomen is soft and nontender with no organomegaly, hernias, masses. GU: Deferred  Extremities:  No cyanosis, clubbing, edema  Neurologic exam : Cn  2-7 intact Strength  equal  in upper & lower extremities Balance, Rhomberg, finger to nose testing could not be completed due to clinical state Deep tendon reflexes are equal Skin: Warm & dry w/o tenting. No significant lesions or rash.  See summary under each active problem in the Problem List with associated updated therapeutic plan

## 2023-01-13 NOTE — Patient Instructions (Signed)
See assessment and plan under each diagnosis in the problem list and acutely for this visit 

## 2023-01-13 NOTE — Assessment & Plan Note (Addendum)
There is been slight progression of the normochromic, normocytic anemia with current H/H of 10.9/34.9.  Prior values were 11.5/37.2.  Iron, serum folate, B12 levels all normal.  No bleeding dyscrasias reported by staff or patient. There is also been some progression of her CKD with most recent creatinine 1.44 and GFR 37 indicating CKD stage IIIb down from prior values of 1.27/43.  Med list reviewed; no change in medications indicated unless there is progression of CKD.

## 2023-01-14 NOTE — Assessment & Plan Note (Signed)
BP controlled; no change in antihypertensive medications  

## 2023-01-14 NOTE — Assessment & Plan Note (Signed)
She remains angina free w/o long acting nitrates.

## 2023-01-20 ENCOUNTER — Ambulatory Visit: Payer: Medicare Other

## 2023-01-20 ENCOUNTER — Other Ambulatory Visit: Payer: Medicare Other

## 2023-01-27 ENCOUNTER — Inpatient Hospital Stay: Payer: Medicare Other

## 2023-01-27 ENCOUNTER — Inpatient Hospital Stay: Payer: Medicare Other | Attending: Hematology

## 2023-01-27 DIAGNOSIS — D631 Anemia in chronic kidney disease: Secondary | ICD-10-CM | POA: Insufficient documentation

## 2023-01-27 DIAGNOSIS — N183 Chronic kidney disease, stage 3 unspecified: Secondary | ICD-10-CM

## 2023-01-27 DIAGNOSIS — N1832 Chronic kidney disease, stage 3b: Secondary | ICD-10-CM | POA: Insufficient documentation

## 2023-01-27 DIAGNOSIS — E538 Deficiency of other specified B group vitamins: Secondary | ICD-10-CM

## 2023-01-27 DIAGNOSIS — E611 Iron deficiency: Secondary | ICD-10-CM | POA: Insufficient documentation

## 2023-01-27 LAB — CBC
HCT: 36.1 % (ref 36.0–46.0)
Hemoglobin: 11.3 g/dL — ABNORMAL LOW (ref 12.0–15.0)
MCH: 28.7 pg (ref 26.0–34.0)
MCHC: 31.3 g/dL (ref 30.0–36.0)
MCV: 91.6 fL (ref 80.0–100.0)
Platelets: 202 10*3/uL (ref 150–400)
RBC: 3.94 MIL/uL (ref 3.87–5.11)
RDW: 13.3 % (ref 11.5–15.5)
WBC: 7.2 10*3/uL (ref 4.0–10.5)
nRBC: 0 % (ref 0.0–0.2)

## 2023-01-27 NOTE — Progress Notes (Signed)
Hgb 11.3 today.  No injection needed.  No complaints voiced.  Discharged with no s/s of distress noted.

## 2023-02-11 ENCOUNTER — Non-Acute Institutional Stay (SKILLED_NURSING_FACILITY): Payer: Medicare Other | Admitting: Adult Health

## 2023-02-11 ENCOUNTER — Encounter: Payer: Self-pay | Admitting: Adult Health

## 2023-02-11 DIAGNOSIS — N183 Chronic kidney disease, stage 3 unspecified: Secondary | ICD-10-CM | POA: Diagnosis not present

## 2023-02-11 DIAGNOSIS — E1122 Type 2 diabetes mellitus with diabetic chronic kidney disease: Secondary | ICD-10-CM

## 2023-02-11 DIAGNOSIS — I700 Atherosclerosis of aorta: Secondary | ICD-10-CM | POA: Diagnosis not present

## 2023-02-11 DIAGNOSIS — G959 Disease of spinal cord, unspecified: Secondary | ICD-10-CM

## 2023-02-11 NOTE — Progress Notes (Signed)
Location:  [facility] Room Number: 150 Place of Service:  SNF (31)   CODE STATUS: dnr   Allergies  Allergen Reactions   Latex Rash   Levofloxacin Nausea And Vomiting   Neomycin-Bacitracin-Polymyxin  [Bacitracin-Neomycin-Polymyxin] Rash   Other Swelling and Rash    TOMATO  SOUP-PER MAR   Prednisone Hives, Itching, Rash and Other (See Comments)   Fish Allergy     rash   Shellfish Allergy Hives   Neosporin [Neomycin-Polymyxin-Gramicidin] Itching and Rash    Chief Complaint  Patient presents with   Acute Visit    Care plan meeting     HPI:  We have come together for her care plan meeting.  BIMS 15/15 mood 0/30. She is nonambulatory with no falls. She requires moderate to max assist with her adls. She is incontinent of bladder and bowel. Dietary: regular diet requires setup for meals weight 116.4 pounds appetite 25-100%. Therapy: none at this time. Activities: VP resident council; does participate. She continues to be followed for her chronic illnesses including:   Aortic atherosclerosis    CKD stage 3 due to type 2 diabetes mellitus    Cervical myelopathy  Past Medical History:  Diagnosis Date   Allergy    Anemia    pernicious anemia   Arthritis    Asthma    Blood transfusion without reported diagnosis    CKD (chronic kidney disease)    COPD (chronic obstructive pulmonary disease) (HCC) 12/23/2019   Diabetic peripheral neuropathy associated with type 2 diabetes mellitus (HCC) 04/07/2021   DM (diabetes mellitus), type 2 with peripheral vascular complications (HCC) 12/23/2019   Gastritis    GERD (gastroesophageal reflux disease)    Glaucoma    History of blood in urine    Hyperlipidemia    Hypertension    Neuropathy    both feet   Ocular hypertension    Osteoporosis    Stroke (HCC)    Vitamin B12 deficiency anemia due to intrinsic factor deficiency     Past Surgical History:  Procedure Laterality Date   CATARACT EXTRACTION W/PHACO Right  11/27/2022   Procedure: CATARACT EXTRACTION PHACO AND INTRAOCULAR LENS PLACEMENT (IOC);  Surgeon: [name], MD;  [facility]: [facility];  Service: Ophthalmology;  Laterality: Right;  CDE: 15.67   CATARACT EXTRACTION W/PHACO Left 12/18/2022   Procedure: CATARACT EXTRACTION PHACO AND INTRAOCULAR LENS PLACEMENT (IOC);  Surgeon: [name], MD;  Location: [facility];  Service: Ophthalmology;  Laterality: Left;  CDE: 11.78   CHOLECYSTECTOMY  2008   COLONOSCOPY  04/30/2011   Procedure: COLONOSCOPY;  Surgeon: Malissa Hippo, MD;  [facility]: [facility];  Service: Endoscopy;  Laterality: N/A;   COLONOSCOPY  05/25/2012   Procedure: COLONOSCOPY;  Surgeon: Malissa Hippo, MD;  [facility]: [facility];  Service: Endoscopy;  Laterality: N/A;  1:25-changed to 1200 Ann to notify pt   COLONOSCOPY Bilateral 12/2017   COLONOSCOPY WITH PROPOFOL N/A 11/29/2020   Procedure: COLONOSCOPY WITH PROPOFOL;  Surgeon: Dolores Frame, MD;  [facility]: [facility];  Service: Gastroenterology;  Laterality: N/A;  1:35 [facility] the [facility]   ESOPHAGOGASTRODUODENOSCOPY N/A 12/07/2019   Procedure: ESOPHAGOGASTRODUODENOSCOPY (EGD);  Surgeon: Malissa Hippo, MD;  [facility];  Service: Endoscopy;  Laterality: N/A;  155   GALLBLADDER SURGERY     right breast cystectomy  1988   TUBAL LIGATION  1975    Social History   Socioeconomic History   Marital status: Divorced  Spouse name: Not on file   Number of children: 4   Years of education: 9   Highest education level: Not on file  Occupational History   Occupation: retired   Tobacco Use   Smoking status: Former    Current packs/day: 0.00    Average packs/day: 1 pack/day for 40.0 years (40.0 ttl pk-yrs)    Types: Cigarettes    Start date: 04/03/1971    Quit date: 04/03/2011    Years since quitting: 11.8   Smokeless tobacco: Never  Vaping Use   Vaping status: Never Used  Substance and Sexual Activity   Alcohol use: No   Drug use: No    Sexual activity: Not Currently  Other Topics Concern   Not on file  Social History Narrative   01/17[birth year] lives at [facility], [place]   Social Determinants of Health   Financial Resource Strain: Not on file  Food Insecurity: Not on file  Transportation Needs: Not on file  Physical Activity: Not on file  Stress: Not on file  Social Connections: Not on file  Intimate Partner Violence: Not on file   Family History  Problem Relation Age of Onset   Diabetes Mother    Hyperlipidemia Mother    Hypertension Mother    Heart disease Father    Hyperlipidemia Father    Diabetes Sister    Hyperlipidemia Sister    Hypertension Sister    Cancer Brother    Heart disease Brother    Hyperlipidemia Brother    Hypertension Brother    Diabetes Sister       VITAL SIGNS BP 133/60   Pulse (!) 50   Temp (!) 97.1 F (36.2 C)   Resp 20   Ht 5\' 4"  (1.626 m)   Wt 116 lb 6.4 oz (52.8 kg)   SpO2 98%   BMI 19.98 kg/m   Outpatient Encounter Medications as of 02/11/2023  Medication Sig   Amino Acids-Protein Hydrolys (PRO-STAT AWC PO) Take 30 mLs by mouth once.   amLODipine (NORVASC) 5 MG tablet Take 5 mg by mouth daily.   aspirin EC 81 MG tablet Take 81 mg by mouth daily. Swallow whole.   brimonidine-timolol (COMBIGAN) 0.2-0.5 % ophthalmic solution Place 1 drop into both eyes every 12 (twelve) hours.   Calcium Carb-Cholecalciferol (CALCIUM 500 + D) 500-5 MG-MCG TABS Take 1 tablet by mouth daily.   cyanocobalamin (V-R VITAMIN B-12) 500 MCG tablet Take 1 tablet (500 mcg total) by mouth every other day.   dorzolamide (TRUSOPT) 2 % ophthalmic solution Place 1 drop into both eyes 2 (two) times daily. (0900 & 1700)   folic acid (V-R FOLIC ACID) 400 MCG tablet Take 1 tablet (400 mcg total) by mouth every other day.   gabapentin (NEURONTIN) 100 MG capsule Take 200 mg by mouth at bedtime. (2100)   gabapentin (NEURONTIN) 100 MG capsule Take 100 mg by mouth daily.   losartan (COZAAR) 25 MG tablet  Take 25 mg by mouth daily.   metoCLOPramide (REGLAN) 5 MG tablet Take 1 tablet (5 mg total) by mouth 3 (three) times daily before meals.   moxifloxacin (VIGAMOX) 0.5 % ophthalmic solution Place 1 drop into the right eye 3 (three) times daily.   nepafenac (ILEVRO) 0.3 % ophthalmic suspension 1 drop daily.   NON FORMULARY Diet:Regular thin liquids  ALLERGIC TO FISH   omeprazole (PRILOSEC) 40 MG capsule Take 40 mg by mouth daily at 6 (six) AM. (0600)   prednisoLONE acetate (PRED FORTE) 1 %  ophthalmic suspension 1 drop in the morning, at noon, and at bedtime.   rosuvastatin (CRESTOR) 20 MG tablet Take 20 mg by mouth daily.   senna-docusate (SENOKOT S) 8.6-50 MG tablet Take 1 tablet by mouth 2 (two) times daily.   sodium chloride 1 g tablet Take 1 g by mouth 2 (two) times daily with a meal.   No facility-administered encounter medications on file as of 02/11/2023.     SIGNIFICANT DIAGNOSTIC EXAMS  PREVIOUS  05-24-20: dexa: t score -2.387  NO NEW EXAMS.     LABS REVIEWED PREVIOUS     10-13-21: urine micro-albumin 12.8 11-10-21: chol 149; ldl 83; trig 80 hdl 50 01-01-22: hgb a1c 5.4 01-14-22: wbc 5.8; hgb 10.2; hct 30.8; mc 89.8 plt 1409; glucose 101; bun 17; creat 1.14; k+ 4.4; na++ 128; ca 8.8; gfr 50; phos 3.5 albumin 3.7  01-22-22: glucose 78; bun 16; creat 1.13; k+ 4.5; na++ 134; ca 9.0; gfr 50  02-04-22: wbc 8.4; hgb 11.5; hct 36.1; mcv 93.p plt 211; glucose 121; bun 17; creat 1.21; k+ 4.7; na++ 134; ca 9.5; gfr 46; protein 8.3; albumin 4.4; vitamin B 12; 913; folate >40; iron 28; tibc 210  02-05-22: tsh 1.858; chol 117; ldl 63 trig 38 hdl 46  06-04-22: wbc 5.5; hgb 9.9; hct 31.6; mcv 92.4 plt 161; glucose 84; bun 19; creat 1.32; k+ 4.5; na++ 136; ca 9.0; gfr 42; phos 4.2; albumin 3.6; iron 33; tibc 162; ferritin 392; urine ACR: 0.19 09-10-22: wbc 6.5; hgb 10.5; hct 33.0; mcv 93.0 plt 181; glucose 113; bun 19; creat 1.27; k+ 4.6; na++ 133; ca 9.2; gfr 43; protein 7.4 albumin 4.1; vitamin B 12:  1756; folate >40; iron 63 tibc 191 10-01-22: chol 137; ldl 80; trig 53; hdl 46 11-02-22: wbc 6.3; hgb 9.1; hct 28.8; mcv 94.1 plt 136; glucose 78; bun 19; creat 1.44; k+ 3.9; na++ 135; ca 8.7; gfr 37; protein 6.0 albumin 3.3 11-05-22: ACR 0.17 11-06-22: PTH 25 ca 9.4 11-11-22; folate 32.8 vitamin B12: 459; iron 30; tibc 172; ferritin 452; serotonin 78  NO NEW LABS   Review of Systems  Constitutional:  Negative for malaise/fatigue.  Respiratory:  Negative for cough and shortness of breath.   Cardiovascular:  Negative for chest pain, palpitations and leg swelling.  Gastrointestinal:  Negative for abdominal pain, constipation and heartburn.  Musculoskeletal:  Negative for back pain, joint pain and myalgias.  Skin: Negative.   Neurological:  Negative for dizziness.  Psychiatric/Behavioral:  The patient is not nervous/anxious.    Physical Exam Constitutional:      General: She is not in acute distress.    Appearance: She is well-developed. She is not diaphoretic.  Neck:     Thyroid: No thyromegaly.  Cardiovascular:     Rate and Rhythm: Normal rate and regular rhythm.     Pulses: Normal pulses.     Heart sounds: Normal heart sounds.  Pulmonary:     Effort: Pulmonary effort is normal. No respiratory distress.     Breath sounds: Normal breath sounds.  Abdominal:     General: Bowel sounds are normal. There is no distension.     Palpations: Abdomen is soft.     Tenderness: There is no abdominal tenderness.  Musculoskeletal:        General: Normal range of motion.     Cervical back: Neck supple.     Right lower leg: No edema.     Left lower leg: No edema.  Lymphadenopathy:  Cervical: No cervical adenopathy.  Skin:    General: Skin is warm and dry.  Neurological:     Mental Status: She is alert and oriented to person, place, and time.  Psychiatric:        Mood and Affect: Mood normal.      ASSESSMENT/ PLAN:  TODAY  Aortic atherosclerosis  CKD stage 3 due to type 2 diabetes  mellitus  Cervical myelopathy  Will continue current medications Will continue current plan of care Will continue to monitor her status.   Time spent with patient: 40 minutes: medications; plan of care dietary.    Synthia Innocent NP [facility]  call [phone]

## 2023-02-18 ENCOUNTER — Non-Acute Institutional Stay (SKILLED_NURSING_FACILITY): Payer: Medicare Other | Admitting: Adult Health

## 2023-02-18 ENCOUNTER — Encounter: Payer: Self-pay | Admitting: Adult Health

## 2023-02-18 DIAGNOSIS — N183 Chronic kidney disease, stage 3 unspecified: Secondary | ICD-10-CM

## 2023-02-18 DIAGNOSIS — E1122 Type 2 diabetes mellitus with diabetic chronic kidney disease: Secondary | ICD-10-CM | POA: Diagnosis not present

## 2023-02-18 DIAGNOSIS — I129 Hypertensive chronic kidney disease with stage 1 through stage 4 chronic kidney disease, or unspecified chronic kidney disease: Secondary | ICD-10-CM | POA: Diagnosis not present

## 2023-02-18 DIAGNOSIS — E1142 Type 2 diabetes mellitus with diabetic polyneuropathy: Secondary | ICD-10-CM

## 2023-02-18 NOTE — Progress Notes (Signed)
Location:  [facility] Room Number: 150 Place of Service:  SNF (31)   CODE STATUS: dnr  Allergies  Allergen Reactions   Latex Rash   Levofloxacin Nausea And Vomiting   Neomycin-Bacitracin-Polymyxin  [Bacitracin-Neomycin-Polymyxin] Rash   Other Swelling and Rash    TOMATO  SOUP-PER MAR   Prednisone Hives, Itching, Rash and Other (See Comments)   Fish Allergy     rash   Shellfish Allergy Hives   Neosporin [Neomycin-Polymyxin-Gramicidin] Itching and Rash    Chief Complaint  Patient presents with   Medical Management of Chronic Issues    HPI:    Past Medical History:  Diagnosis Date   Allergy    Anemia    pernicious anemia   Arthritis    Asthma    Blood transfusion without reported diagnosis    CKD (chronic kidney disease)    COPD (chronic obstructive pulmonary disease) (HCC) 12/23/2019   Diabetic peripheral neuropathy associated with type 2 diabetes mellitus (HCC) 04/07/2021   DM (diabetes mellitus), type 2 with peripheral vascular complications (HCC) 12/23/2019   Gastritis    GERD (gastroesophageal reflux disease)    Glaucoma    History of blood in urine    Hyperlipidemia    Hypertension    Neuropathy    both feet   Ocular hypertension    Osteoporosis    Stroke (HCC)    Vitamin B12 deficiency anemia due to intrinsic factor deficiency     Past Surgical History:  Procedure Laterality Date   CATARACT EXTRACTION W/PHACO Right 11/27/2022   Procedure: CATARACT EXTRACTION PHACO AND INTRAOCULAR LENS PLACEMENT (IOC);  Surgeon: [name], MD;  Location: [facility];  Service: Ophthalmology;  Laterality: Right;  CDE: 15.67   CATARACT EXTRACTION W/PHACO Left 12/18/2022   Procedure: CATARACT EXTRACTION PHACO AND INTRAOCULAR LENS PLACEMENT (IOC);  Surgeon: [name], MD;  Location: [facility];  Service: Ophthalmology;  Laterality: Left;  CDE: 11.78   CHOLECYSTECTOMY  2008   COLONOSCOPY  04/30/2011   Procedure: COLONOSCOPY;  Surgeon: Malissa Hippo, MD;  [facility]: [facility];  Service: Endoscopy;  Laterality: N/A;   COLONOSCOPY  05/25/2012   Procedure: COLONOSCOPY;  Surgeon: Malissa Hippo, MD;  [facility];  Service: Endoscopy;  Laterality: N/A;  1:25-changed to 1200 Ann to notify pt   COLONOSCOPY Bilateral 12/2017   COLONOSCOPY WITH PROPOFOL N/A 11/29/2020   Procedure: COLONOSCOPY WITH PROPOFOL;  Surgeon: Dolores Frame, MD;  [facility];  Service: Gastroenterology;  Laterality: N/A;  1:35 [facility] in the [facility]   ESOPHAGOGASTRODUODENOSCOPY N/A 12/07/2019   Procedure: ESOPHAGOGASTRODUODENOSCOPY (EGD);  Surgeon: Malissa Hippo, MD;  [facility];  Service: Endoscopy;  Laterality: N/A;  155   GALLBLADDER SURGERY     right breast cystectomy  1988   TUBAL LIGATION  1975    Social History   Socioeconomic History   Marital status: Divorced    Spouse name: Not on file   Number of children: 4   Years of education: 9   Highest education level: Not on file  Occupational History   Occupation: retired   Tobacco Use   Smoking status: Former    Current packs/day: 0.00    Average packs/day: 1 pack/day for 40.0 years (40.0 ttl pk-yrs)    Types: Cigarettes    Start date: 04/03/1971    Quit date: 04/03/2011    Years since quitting: 11.8   Smokeless tobacco: Never  Vaping Use  Vaping status: Never Used  Substance and Sexual Activity   Alcohol use: No   Drug use: No   Sexual activity: Not Currently  Other Topics Concern   Not on file  Social History Narrative   01/17[birth year] lives at [facility], [place]   Social Determinants of Health   Financial Resource Strain: Not on file  Food Insecurity: Not on file  Transportation Needs: Not on file  Physical Activity: Not on file  Stress: Not on file  Social Connections: Not on file  Intimate Partner Violence: Not on file   Family History  Problem Relation Age of Onset   Diabetes Mother    Hyperlipidemia Mother     Hypertension Mother    Heart disease Father    Hyperlipidemia Father    Diabetes Sister    Hyperlipidemia Sister    Hypertension Sister    Cancer Brother    Heart disease Brother    Hyperlipidemia Brother    Hypertension Brother    Diabetes Sister       VITAL SIGNS BP (!) 103/59   Pulse (!) 59   Temp 97.6 F (36.4 C)   Resp 20   Ht 5\' 4"  (1.626 m)   Wt 117 lb 3.2 oz (53.2 kg)   SpO2 100%   BMI 20.12 kg/m   Outpatient Encounter Medications as of 02/18/2023  Medication Sig   Amino Acids-Protein Hydrolys (PRO-STAT AWC PO) Take 30 mLs by mouth once.   amLODipine (NORVASC) 5 MG tablet Take 5 mg by mouth daily.   aspirin EC 81 MG tablet Take 81 mg by mouth daily. Swallow whole.   brimonidine-timolol (COMBIGAN) 0.2-0.5 % ophthalmic solution Place 1 drop into both eyes every 12 (twelve) hours.   Calcium Carb-Cholecalciferol (CALCIUM 500 + D) 500-5 MG-MCG TABS Take 1 tablet by mouth daily.   cyanocobalamin (V-R VITAMIN B-12) 500 MCG tablet Take 1 tablet (500 mcg total) by mouth every other day.   dorzolamide (TRUSOPT) 2 % ophthalmic solution Place 1 drop into both eyes 2 (two) times daily. (0900 & 1700)   folic acid (V-R FOLIC ACID) 400 MCG tablet Take 1 tablet (400 mcg total) by mouth every other day.   gabapentin (NEURONTIN) 100 MG capsule Take 200 mg by mouth at bedtime. (2100)   gabapentin (NEURONTIN) 100 MG capsule Take 100 mg by mouth daily.   losartan (COZAAR) 25 MG tablet Take 25 mg by mouth daily.   metoCLOPramide (REGLAN) 5 MG tablet Take 1 tablet (5 mg total) by mouth 3 (three) times daily before meals.   moxifloxacin (VIGAMOX) 0.5 % ophthalmic solution Place 1 drop into the right eye 3 (three) times daily.   nepafenac (ILEVRO) 0.3 % ophthalmic suspension 1 drop daily.   NON FORMULARY Diet:Regular thin liquids  ALLERGIC TO FISH   omeprazole (PRILOSEC) 40 MG capsule Take 40 mg by mouth daily at 6 (six) AM. (0600)   prednisoLONE acetate (PRED FORTE) 1 % ophthalmic  suspension 1 drop in the morning, at noon, and at bedtime.   rosuvastatin (CRESTOR) 20 MG tablet Take 20 mg by mouth daily.   senna-docusate (SENOKOT S) 8.6-50 MG tablet Take 1 tablet by mouth 2 (two) times daily.   sodium chloride 1 g tablet Take 1 g by mouth 2 (two) times daily with a meal.   No facility-administered encounter medications on file as of 02/18/2023.     SIGNIFICANT DIAGNOSTIC EXAMS  PREVIOUS  05-24-20: dexa: t score -2.387  NO NEW EXAMS.  LABS REVIEWED PREVIOUS     06-04-22: wbc 5.5; hgb 9.9; hct 31.6; mcv 92.4 plt 161; glucose 84; bun 19; creat 1.32; k+ 4.5; na++ 136; ca 9.0; gfr 42; phos 4.2; albumin 3.6; iron 33; tibc 162; ferritin 392; urine ACR: 0.19 09-10-22: wbc 6.5; hgb 10.5; hct 33.0; mcv 93.0 plt 181; glucose 113; bun 19; creat 1.27; k+ 4.6; na++ 133; ca 9.2; gfr 43; protein 7.4 albumin 4.1; vitamin B 12: 1756; folate >40; iron 63 tibc 191 10-01-22: chol 137; ldl 80; trig 53; hdl 46 11-02-22: wbc 6.3; hgb 9.1; hct 28.8; mcv 94.1 plt 136; glucose 78; bun 19; creat 1.44; k+ 3.9; na++ 135; ca 8.7; gfr 37; protein 6.0 albumin 3.3 11-05-22: ACR 0.17 11-06-22: PTH 25 ca 9.4 11-11-22; folate 32.8 vitamin B12: 459; iron 30; tibc 172; ferritin 452; serotonin 78  TODAY  12-31-22: wbc 5.4; hgb 10.9; hct 34.9; mcv 93.8 plt 167; hgb A1c 4.9; vitamin B12: 829; folate 39.3; vitamin D 59.03 01-27-23: wbc 7.2; hgb 11.3; hct 36.1; mcv 91.6 plt 202    Review of Systems  Constitutional:  Negative for malaise/fatigue.  Respiratory:  Negative for cough and shortness of breath.   Cardiovascular:  Negative for chest pain, palpitations and leg swelling.  Gastrointestinal:  Negative for abdominal pain, constipation and heartburn.  Musculoskeletal:  Negative for back pain, joint pain and myalgias.  Skin: Negative.   Neurological:  Negative for dizziness.  Psychiatric/Behavioral:  The patient is not nervous/anxious.     Physical Exam Constitutional:      General: She is not in  acute distress.    Appearance: She is well-developed. She is not diaphoretic.  Neck:     Thyroid: No thyromegaly.  Cardiovascular:     Rate and Rhythm: Normal rate and regular rhythm.     Pulses: Normal pulses.     Heart sounds: Normal heart sounds.  Pulmonary:     Effort: Pulmonary effort is normal. No respiratory distress.     Breath sounds: Normal breath sounds.  Abdominal:     General: Bowel sounds are normal. There is no distension.     Palpations: Abdomen is soft.     Tenderness: There is no abdominal tenderness.  Musculoskeletal:        General: Normal range of motion.     Cervical back: Neck supple.     Right lower leg: No edema.     Left lower leg: No edema.  Lymphadenopathy:     Cervical: No cervical adenopathy.  Skin:    General: Skin is warm and dry.  Neurological:     Mental Status: She is alert and oriented to person, place, and time.  Psychiatric:        Mood and Affect: Mood normal.         ASSESSMENT/ PLAN:  TODAY   Hypertension associated with stage 3 chronic kidney disease due to type 2 diabetes mellitus: b/p 103/59 will continue cozaar 25 mg daily will stop norvasc due to bradycardia and low b/p readings.   2. CKD stage 3 due to type 2 diabetes mellitus: bun 19; creat 1.27 gfr 43  3. Diabetic peripheral neuropathy associated with type 2 diabetes mellitus: is on gabapentin 200 mg twice daily   PREVIOUS    4. Other chronic gastritis without hemorrhage: will continue prilosec 40 mg daily reglan 5 mg with meals.   5. Type 2 diabetes mellitus with stage 3 chronic kidney disease without long term current use of insulin: hgb A1c 5.3;  81 mg asa; is on statin and arb  6. Hyperlipidemia associated with type 2 diabetes mellitus: ldl 63 is on crestor 10 mg daily    7. Anemia due to chronic renal failure treated with erythropoietin stage 3: hgb 10.5 will continue iron daily and is followed by oncology  8. Increased intraocular pressure bilateral: will  continue trusopt to both eyes twice daily  9. Vitamin B 12 deficiency: level 459; folate 32.8   10. Vitamin D deficiency: level 41.14 will change to 1000 units daily   11. Thyroiditis: tsh 1.858 will monitor  12. Cervical myelopathy: is without change  13. Failure to thrive in adult: weight is 117 pounds; will continue to monitor   14. Aortic atherosclerosis: (ct 12-23-19) will monitor  15. 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 Chronic obstructive pulmonary disease unspecified COPD type: will monitor        Synthia Innocent NP [facility]  call [phone]

## 2023-02-18 NOTE — Addendum Note (Signed)
Addended by: Sharee Holster on: 02/18/2023 01:25 PM   Modules accepted: Orders

## 2023-02-18 NOTE — Progress Notes (Signed)
This encounter was created in error - please disregard.

## 2023-02-23 ENCOUNTER — Other Ambulatory Visit (HOSPITAL_COMMUNITY)
Admission: RE | Admit: 2023-02-23 | Discharge: 2023-02-23 | Disposition: A | Discharge status: Home / Self Care | Payer: Medicare Other | Source: Skilled Nursing Facility | Attending: Adult Health | Admitting: Adult Health

## 2023-02-23 DIAGNOSIS — N189 Chronic kidney disease, unspecified: Secondary | ICD-10-CM | POA: Insufficient documentation

## 2023-02-23 DIAGNOSIS — D631 Anemia in chronic kidney disease: Secondary | ICD-10-CM | POA: Diagnosis not present

## 2023-02-23 DIAGNOSIS — E559 Vitamin D deficiency, unspecified: Secondary | ICD-10-CM | POA: Insufficient documentation

## 2023-02-23 LAB — FERRITIN: Ferritin: 441 ng/mL — ABNORMAL HIGH (ref 11–307)

## 2023-02-23 LAB — CBC WITH DIFFERENTIAL/PLATELET
Abs Immature Granulocytes: 0.02 10*3/uL (ref 0.00–0.07)
Basophils Absolute: 0 10*3/uL (ref 0.0–0.1)
Basophils Relative: 1 %
Eosinophils Absolute: 0.5 10*3/uL (ref 0.0–0.5)
Eosinophils Relative: 7 %
HCT: 31.7 % — ABNORMAL LOW (ref 36.0–46.0)
Hemoglobin: 10.3 g/dL — ABNORMAL LOW (ref 12.0–15.0)
Immature Granulocytes: 0 %
Lymphocytes Relative: 29 %
Lymphs Abs: 1.9 10*3/uL (ref 0.7–4.0)
MCH: 29.9 pg (ref 26.0–34.0)
MCHC: 32.5 g/dL (ref 30.0–36.0)
MCV: 92.2 fL (ref 80.0–100.0)
Monocytes Absolute: 0.5 10*3/uL (ref 0.1–1.0)
Monocytes Relative: 7 %
Neutro Abs: 3.6 10*3/uL (ref 1.7–7.7)
Neutrophils Relative %: 56 %
Platelets: 158 10*3/uL (ref 150–400)
RBC: 3.44 MIL/uL — ABNORMAL LOW (ref 3.87–5.11)
RDW: 14.3 % (ref 11.5–15.5)
WBC: 6.5 10*3/uL (ref 4.0–10.5)
nRBC: 0 % (ref 0.0–0.2)

## 2023-02-23 LAB — RENAL FUNCTION PANEL
Albumin: 3.5 g/dL (ref 3.5–5.0)
Anion gap: 7 (ref 5–15)
BUN: 18 mg/dL (ref 8–23)
CO2: 24 mmol/L (ref 22–32)
Calcium: 8.7 mg/dL — ABNORMAL LOW (ref 8.9–10.3)
Chloride: 102 mmol/L (ref 98–111)
Creatinine, Ser: 1.26 mg/dL — ABNORMAL HIGH (ref 0.44–1.00)
GFR, Estimated: 44 mL/min — ABNORMAL LOW (ref >60)
Glucose, Bld: 89 mg/dL (ref 70–99)
Phosphorus: 3.6 mg/dL (ref 2.5–4.6)
Potassium: 4.5 mmol/L (ref 3.5–5.1)
Sodium: 133 mmol/L — ABNORMAL LOW (ref 135–145)

## 2023-02-23 LAB — IRON AND TIBC
Iron: 44 ug/dL (ref 28–170)
Saturation Ratios: 26 % (ref 10.4–31.8)
TIBC: 168 ug/dL — ABNORMAL LOW (ref 250–450)
UIBC: 124 ug/dL

## 2023-02-23 LAB — VITAMIN D 25 HYDROXY (VIT D DEFICIENCY, FRACTURES): Vit D, 25-Hydroxy: 64.22 ng/mL (ref 30–100)

## 2023-02-23 LAB — PROTEIN / CREATININE RATIO, URINE
Creatinine, Urine: 25 mg/dL
Protein Creatinine Ratio: 0.52 mg/mg{Cre} — ABNORMAL HIGH (ref 0.00–0.15)
Total Protein, Urine: 13 mg/dL

## 2023-02-24 ENCOUNTER — Inpatient Hospital Stay: Payer: Medicare Other | Attending: Hematology

## 2023-02-24 ENCOUNTER — Inpatient Hospital Stay: Payer: Medicare Other

## 2023-02-24 VITALS — BP 160/73 | HR 66 | Temp 97.5°F | Resp 18

## 2023-02-24 DIAGNOSIS — D631 Anemia in chronic kidney disease: Secondary | ICD-10-CM | POA: Diagnosis not present

## 2023-02-24 DIAGNOSIS — E611 Iron deficiency: Secondary | ICD-10-CM | POA: Insufficient documentation

## 2023-02-24 DIAGNOSIS — N183 Chronic kidney disease, stage 3 unspecified: Secondary | ICD-10-CM

## 2023-02-24 DIAGNOSIS — N1832 Chronic kidney disease, stage 3b: Secondary | ICD-10-CM | POA: Insufficient documentation

## 2023-02-24 DIAGNOSIS — E538 Deficiency of other specified B group vitamins: Secondary | ICD-10-CM

## 2023-02-24 LAB — CBC
HCT: 32.1 % — ABNORMAL LOW (ref 36.0–46.0)
Hemoglobin: 10.3 g/dL — ABNORMAL LOW (ref 12.0–15.0)
MCH: 29.5 pg (ref 26.0–34.0)
MCHC: 32.1 g/dL (ref 30.0–36.0)
MCV: 92 fL (ref 80.0–100.0)
Platelets: 161 10*3/uL (ref 150–400)
RBC: 3.49 MIL/uL — ABNORMAL LOW (ref 3.87–5.11)
RDW: 14.4 % (ref 11.5–15.5)
WBC: 6.2 10*3/uL (ref 4.0–10.5)
nRBC: 0 % (ref 0.0–0.2)

## 2023-02-24 MED ORDER — EPOETIN ALFA-EPBX 10000 UNIT/ML IJ SOLN
10000.0000 [IU] | Freq: Once | INTRAMUSCULAR | Status: AC
  Administered 2023-02-24: 10000 [IU] via SUBCUTANEOUS
  Filled 2023-02-24: qty 1

## 2023-02-24 NOTE — Progress Notes (Signed)
Patient tolerated injection with no complaints voiced.  Site clean and dry with no bruising or swelling noted at site.  See MAR for details.  Band aid applied.  Patient stable during and after injection.  Vss with discharge and left in satisfactory condition with no s/s of distress noted.  

## 2023-02-24 NOTE — Patient Instructions (Signed)

## 2023-03-05 DIAGNOSIS — L84 Corns and callosities: Secondary | ICD-10-CM | POA: Diagnosis not present

## 2023-03-05 DIAGNOSIS — E871 Hypo-osmolality and hyponatremia: Secondary | ICD-10-CM | POA: Diagnosis not present

## 2023-03-05 DIAGNOSIS — N1832 Chronic kidney disease, stage 3b: Secondary | ICD-10-CM | POA: Diagnosis not present

## 2023-03-05 DIAGNOSIS — E1122 Type 2 diabetes mellitus with diabetic chronic kidney disease: Secondary | ICD-10-CM | POA: Diagnosis not present

## 2023-03-05 DIAGNOSIS — I129 Hypertensive chronic kidney disease with stage 1 through stage 4 chronic kidney disease, or unspecified chronic kidney disease: Secondary | ICD-10-CM | POA: Diagnosis not present

## 2023-03-05 DIAGNOSIS — I739 Peripheral vascular disease, unspecified: Secondary | ICD-10-CM | POA: Diagnosis not present

## 2023-03-05 DIAGNOSIS — L603 Nail dystrophy: Secondary | ICD-10-CM | POA: Diagnosis not present

## 2023-03-05 DIAGNOSIS — E1151 Type 2 diabetes mellitus with diabetic peripheral angiopathy without gangrene: Secondary | ICD-10-CM | POA: Diagnosis not present

## 2023-03-23 ENCOUNTER — Other Ambulatory Visit: Payer: Self-pay

## 2023-03-24 ENCOUNTER — Inpatient Hospital Stay: Payer: Medicare Other | Attending: Hematology

## 2023-03-24 ENCOUNTER — Inpatient Hospital Stay: Payer: Medicare Other

## 2023-03-24 VITALS — BP 162/74 | HR 64 | Temp 98.3°F | Resp 18

## 2023-03-24 DIAGNOSIS — Z87891 Personal history of nicotine dependence: Secondary | ICD-10-CM | POA: Diagnosis not present

## 2023-03-24 DIAGNOSIS — Z7901 Long term (current) use of anticoagulants: Secondary | ICD-10-CM | POA: Insufficient documentation

## 2023-03-24 DIAGNOSIS — E611 Iron deficiency: Secondary | ICD-10-CM | POA: Insufficient documentation

## 2023-03-24 DIAGNOSIS — Z79899 Other long term (current) drug therapy: Secondary | ICD-10-CM | POA: Insufficient documentation

## 2023-03-24 DIAGNOSIS — E538 Deficiency of other specified B group vitamins: Secondary | ICD-10-CM | POA: Insufficient documentation

## 2023-03-24 DIAGNOSIS — D631 Anemia in chronic kidney disease: Secondary | ICD-10-CM | POA: Diagnosis not present

## 2023-03-24 DIAGNOSIS — N1832 Chronic kidney disease, stage 3b: Secondary | ICD-10-CM | POA: Insufficient documentation

## 2023-03-24 DIAGNOSIS — Z7982 Long term (current) use of aspirin: Secondary | ICD-10-CM | POA: Insufficient documentation

## 2023-03-24 DIAGNOSIS — Z86718 Personal history of other venous thrombosis and embolism: Secondary | ICD-10-CM | POA: Insufficient documentation

## 2023-03-24 LAB — CBC WITH DIFFERENTIAL/PLATELET
Abs Immature Granulocytes: 0.02 10*3/uL (ref 0.00–0.07)
Basophils Absolute: 0.1 10*3/uL (ref 0.0–0.1)
Basophils Relative: 1 %
Eosinophils Absolute: 0.5 10*3/uL (ref 0.0–0.5)
Eosinophils Relative: 7 %
HCT: 31.5 % — ABNORMAL LOW (ref 36.0–46.0)
Hemoglobin: 9.8 g/dL — ABNORMAL LOW (ref 12.0–15.0)
Immature Granulocytes: 0 %
Lymphocytes Relative: 29 %
Lymphs Abs: 1.8 10*3/uL (ref 0.7–4.0)
MCH: 29.1 pg (ref 26.0–34.0)
MCHC: 31.1 g/dL (ref 30.0–36.0)
MCV: 93.5 fL (ref 80.0–100.0)
Monocytes Absolute: 0.4 10*3/uL (ref 0.1–1.0)
Monocytes Relative: 6 %
Neutro Abs: 3.5 10*3/uL (ref 1.7–7.7)
Neutrophils Relative %: 57 %
Platelets: 235 10*3/uL (ref 150–400)
RBC: 3.37 MIL/uL — ABNORMAL LOW (ref 3.87–5.11)
RDW: 15.4 % (ref 11.5–15.5)
WBC: 6.2 10*3/uL (ref 4.0–10.5)
nRBC: 0 % (ref 0.0–0.2)

## 2023-03-24 LAB — IRON AND TIBC
Iron: 33 ug/dL (ref 28–170)
Saturation Ratios: 19 % (ref 10.4–31.8)
TIBC: 173 ug/dL — ABNORMAL LOW (ref 250–450)
UIBC: 140 ug/dL

## 2023-03-24 LAB — COMPREHENSIVE METABOLIC PANEL
ALT: 11 U/L (ref 0–44)
AST: 11 U/L — ABNORMAL LOW (ref 15–41)
Albumin: 3.7 g/dL (ref 3.5–5.0)
Alkaline Phosphatase: 70 U/L (ref 38–126)
Anion gap: 5 (ref 5–15)
BUN: 18 mg/dL (ref 8–23)
CO2: 24 mmol/L (ref 22–32)
Calcium: 8.4 mg/dL — ABNORMAL LOW (ref 8.9–10.3)
Chloride: 105 mmol/L (ref 98–111)
Creatinine, Ser: 1.44 mg/dL — ABNORMAL HIGH (ref 0.44–1.00)
GFR, Estimated: 37 mL/min — ABNORMAL LOW (ref >60)
Glucose, Bld: 131 mg/dL — ABNORMAL HIGH (ref 70–99)
Potassium: 4.7 mmol/L (ref 3.5–5.1)
Sodium: 134 mmol/L — ABNORMAL LOW (ref 135–145)
Total Bilirubin: 0.2 mg/dL — ABNORMAL LOW (ref 0.3–1.2)
Total Protein: 6.9 g/dL (ref 6.5–8.1)

## 2023-03-24 LAB — FOLATE: Folate: 34.6 ng/mL (ref >5.9)

## 2023-03-24 LAB — FERRITIN: Ferritin: 479 ng/mL — ABNORMAL HIGH (ref 11–307)

## 2023-03-24 LAB — VITAMIN B12: Vitamin B-12: 1068 pg/mL — ABNORMAL HIGH (ref 180–914)

## 2023-03-24 MED ORDER — EPOETIN ALFA-EPBX 10000 UNIT/ML IJ SOLN
10000.0000 [IU] | Freq: Once | INTRAMUSCULAR | Status: AC
  Administered 2023-03-24: 10000 [IU] via SUBCUTANEOUS
  Filled 2023-03-24: qty 1

## 2023-03-24 NOTE — Progress Notes (Signed)
Traci Parker presents today for injection per the provider's orders. Retacrit 10,000U administration without incident; injection site WNL; see MAR for injection details.  Patient tolerated procedure well and without incident.  No questions or complaints noted at this time.   Discharged from clinic via wheelchair in stable condition. Alert and oriented x 3. F/U with [facility] as scheduled.

## 2023-03-24 NOTE — Patient Instructions (Signed)
[facility]  Discharge Instructions: Thank you for choosing [facility] to provide your oncology and hematology care.  If you have a lab appointment with the Cancer Center - please note that after April 8th, 2024, all labs will be drawn in the cancer center.  You do not have to check in or register with the main entrance as you have in the past but will complete your check-in in the cancer center.  Wear comfortable clothing and clothing appropriate for easy access to any Portacath or PICC line.   We strive to give you quality time with your provider. You may need to reschedule your appointment if you arrive late (15 or more minutes).  Arriving late affects you and other patients whose appointments are after yours.  Also, if you miss three or more appointments without notifying the office, you may be dismissed from the clinic at the provider's discretion.      For prescription refill requests, have your pharmacy contact our office and allow 72 hours for refills to be completed.    Today you received Retacrit 10,000U     BELOW ARE SYMPTOMS THAT SHOULD BE REPORTED IMMEDIATELY: *FEVER GREATER THAN 100.4 F (38 C) OR HIGHER *CHILLS OR SWEATING *NAUSEA AND VOMITING THAT IS NOT CONTROLLED WITH YOUR NAUSEA MEDICATION *UNUSUAL SHORTNESS OF BREATH *UNUSUAL BRUISING OR BLEEDING *URINARY PROBLEMS (pain or burning when urinating, or frequent urination) *BOWEL PROBLEMS (unusual diarrhea, constipation, pain near the anus) TENDERNESS IN MOUTH AND THROAT WITH OR WITHOUT PRESENCE OF ULCERS (sore throat, sores in mouth, or a toothache) UNUSUAL RASH, SWELLING OR PAIN  UNUSUAL VAGINAL DISCHARGE OR ITCHING   Items with * indicate a potential emergency and should be followed up as soon as possible or go to the Emergency Department if any problems should occur.  Please show the CHEMOTHERAPY ALERT CARD or IMMUNOTHERAPY ALERT CARD at check-in to the Emergency Department and  triage nurse.  Should you have questions after your visit or need to cancel or reschedule your appointment, please contact [facility] [phone]  and follow the prompts.  Office hours are 8:00 a.m. to 4:30 p.m. Monday - Friday. Please note that voicemails left after 4:00 p.m. may not be returned until the following business day.  We are closed weekends and major holidays. You have access to a nurse at all times for urgent questions. Please call the main number to the clinic [phone] and follow the prompts.  For any non-urgent questions, you may also contact your provider using MyChart. We now offer e-Visits for anyone 65 and older to request care online for non-urgent symptoms. For details visit [email or web address].   Also download the MyChart app! Go to the app store, search "MyChart", open the app, select [place], and log in with your MyChart username and password.

## 2023-03-29 ENCOUNTER — Encounter: Payer: Self-pay | Admitting: Adult Health

## 2023-03-29 ENCOUNTER — Non-Acute Institutional Stay (SKILLED_NURSING_FACILITY): Payer: Medicare Other | Admitting: Adult Health

## 2023-03-29 DIAGNOSIS — D631 Anemia in chronic kidney disease: Secondary | ICD-10-CM

## 2023-03-29 DIAGNOSIS — E1122 Type 2 diabetes mellitus with diabetic chronic kidney disease: Secondary | ICD-10-CM

## 2023-03-29 DIAGNOSIS — E1169 Type 2 diabetes mellitus with other specified complication: Secondary | ICD-10-CM | POA: Diagnosis not present

## 2023-03-29 DIAGNOSIS — N183 Chronic kidney disease, stage 3 unspecified: Secondary | ICD-10-CM | POA: Diagnosis not present

## 2023-03-29 DIAGNOSIS — I129 Hypertensive chronic kidney disease with stage 1 through stage 4 chronic kidney disease, or unspecified chronic kidney disease: Secondary | ICD-10-CM

## 2023-03-29 DIAGNOSIS — E785 Hyperlipidemia, unspecified: Secondary | ICD-10-CM | POA: Diagnosis not present

## 2023-03-29 LAB — METHYLMALONIC ACID, SERUM: Methylmalonic Acid, Quantitative: 194 nmol/L (ref 0–378)

## 2023-03-29 NOTE — Progress Notes (Unsigned)
Location:  [facility] Room Number: 150 Place of Service:  SNF (31)   CODE STATUS: dnr   Allergies  Allergen Reactions   Latex Rash   Levofloxacin Nausea And Vomiting   Neomycin-Bacitracin-Polymyxin  [Bacitracin-Neomycin-Polymyxin] Rash   Other Swelling and Rash    TOMATO  SOUP-PER MAR   Prednisone Hives, Itching, Rash and Other (See Comments)   Fish Allergy     rash   Shellfish Allergy Hives   Neosporin [Neomycin-Polymyxin-Gramicidin] Itching and Rash    Chief Complaint  Patient presents with   Medical Management of Chronic Issues                       Type 2 diabetes mellitus with stage 3 chronic kidney disease without long term current use of insulin:  Hyperlipidemia associated with type 2 diabetes mellitus:  Anemia due to chronic renal failure with erythropoietin stage 3     HPI:  She is a 77 year old long term resident of this facility being seen for the management of her chronic illnesses: Type 2 diabetes mellitus with stage 3 chronic kidney disease without long term current use of insulin:  Hyperlipidemia associated with type 2 diabetes mellitus:  Anemia due to chronic renal failure with erythropoietin stage 3. She continues to get out of bed daily. She does wear gloves daily due to cold intolerance. There are no reports of uncontrolled pain.   Past Medical History:  Diagnosis Date   Allergy    Anemia    pernicious anemia   Arthritis    Asthma    Blood transfusion without reported diagnosis    CKD (chronic kidney disease)    COPD (chronic obstructive pulmonary disease) (HCC) 12/23/2019   Diabetic peripheral neuropathy associated with type 2 diabetes mellitus (HCC) 04/07/2021   DM (diabetes mellitus), type 2 with peripheral vascular complications (HCC) 12/23/2019   Gastritis    GERD (gastroesophageal reflux disease)    Glaucoma    History of blood in urine    Hyperlipidemia    Hypertension    Neuropathy    both feet   Ocular  hypertension    Osteoporosis    Stroke (HCC)    Vitamin B12 deficiency anemia due to intrinsic factor deficiency     Past Surgical History:  Procedure Laterality Date   CATARACT EXTRACTION W/PHACO Right 11/27/2022   Procedure: CATARACT EXTRACTION PHACO AND INTRAOCULAR LENS PLACEMENT (IOC);  Surgeon: [name], MD;  Location: [facility];  Service: Ophthalmology;  Laterality: Right;  CDE: 15.67   CATARACT EXTRACTION W/PHACO Left 12/18/2022   Procedure: CATARACT EXTRACTION PHACO AND INTRAOCULAR LENS PLACEMENT (IOC);  Surgeon: [name], MD;  Location: [facility];  Service: Ophthalmology;  Laterality: Left;  CDE: 11.78   CHOLECYSTECTOMY  2008   COLONOSCOPY  04/30/2011   Procedure: COLONOSCOPY;  Surgeon: Malissa Hippo, MD;  [facility];  Service: Endoscopy;  Laterality: N/A;   COLONOSCOPY  05/25/2012   Procedure: COLONOSCOPY;  Surgeon: Malissa Hippo, MD;  [facility];  Service: Endoscopy;  Laterality: N/A;  1:25-changed to 1200 Ann to notify pt   COLONOSCOPY Bilateral 12/2017   COLONOSCOPY WITH PROPOFOL N/A 11/29/2020   Procedure: COLONOSCOPY WITH PROPOFOL;  Surgeon: Dolores Frame, MD;  [facility]: [facility];  Service: Gastroenterology;  Laterality: N/A;  1:35 patient is in the [facility]   ESOPHAGOGASTRODUODENOSCOPY N/A 12/07/2019   Procedure: ESOPHAGOGASTRODUODENOSCOPY (EGD);  Surgeon: Malissa Hippo, MD;  [facility];  Service: Endoscopy;  Laterality: N/A;  155   GALLBLADDER SURGERY     right breast cystectomy  1988   TUBAL LIGATION  1975    Social History   Socioeconomic History   Marital status: Divorced    Spouse name: Not on file   Number of children: 4   Years of education: 9   Highest education level: Not on file  Occupational History   Occupation: retired   Tobacco Use   Smoking status: Former    Current packs/day: 0.00    Average packs/day: 1 pack/day for 40.0 years (40.0 ttl pk-yrs)    Types: Cigarettes     Start date: 04/03/1971    Quit date: 04/03/2011    Years since quitting: 11.9   Smokeless tobacco: Never  Vaping Use   Vaping status: Never Used  Substance and Sexual Activity   Alcohol use: No   Drug use: No   Sexual activity: Not Currently  Other Topics Concern   Not on file  Social History Narrative   01/17/20 lives at [facility], [place]   Social Determinants of Health   Financial Resource Strain: Not on file  Food Insecurity: Not on file  Transportation Needs: Not on file  Physical Activity: Not on file  Stress: Not on file  Social Connections: Not on file  Intimate Partner Violence: Not on file   Family History  Problem Relation Age of Onset   Diabetes Mother    Hyperlipidemia Mother    Hypertension Mother    Heart disease Father    Hyperlipidemia Father    Diabetes Sister    Hyperlipidemia Sister    Hypertension Sister    Cancer Brother    Heart disease Brother    Hyperlipidemia Brother    Hypertension Brother    Diabetes Sister       VITAL SIGNS BP (!) 148/75   Pulse 60   Temp (!) 97.2 F (36.2 C)   Resp 17   Ht 5\' 4"  (1.626 m)   Wt 116 lb 3.2 oz (52.7 kg)   SpO2 98%   BMI 19.95 kg/m   Outpatient Encounter Medications as of 03/29/2023  Medication Sig   Amino Acids-Protein Hydrolys (PRO-STAT AWC PO) Take 30 mLs by mouth once.   aspirin EC 81 MG tablet Take 81 mg by mouth daily. Swallow whole.   brimonidine-timolol (COMBIGAN) 0.2-0.5 % ophthalmic solution Place 1 drop into both eyes every 12 (twelve) hours.   Calcium Carb-Cholecalciferol (CALCIUM 500 + D) 500-5 MG-MCG TABS Take 1 tablet by mouth daily.   cyanocobalamin (V-R VITAMIN B-12) 500 MCG tablet Take 1 tablet (500 mcg total) by mouth every other day.   dorzolamide (TRUSOPT) 2 % ophthalmic solution Place 1 drop into both eyes 2 (two) times daily. (0900 & 1700)   folic acid (V-R FOLIC ACID) 400 MCG tablet Take 1 tablet (400 mcg total) by mouth every other day.   gabapentin (NEURONTIN) 100 MG  capsule Take 200 mg by mouth at bedtime. (2100)   gabapentin (NEURONTIN) 100 MG capsule Take 100 mg by mouth daily.   losartan (COZAAR) 25 MG tablet Take 25 mg by mouth daily.   metoCLOPramide (REGLAN) 5 MG tablet Take 1 tablet (5 mg total) by mouth 3 (three) times daily before meals.   moxifloxacin (VIGAMOX) 0.5 % ophthalmic solution Place 1 drop into the right eye 3 (three) times daily.   nepafenac (ILEVRO) 0.3 % ophthalmic suspension 1 drop daily.  NON FORMULARY Diet:Regular thin liquids  ALLERGIC TO FISH   omeprazole (PRILOSEC) 40 MG capsule Take 40 mg by mouth daily at 6 (six) AM. (0600)   prednisoLONE acetate (PRED FORTE) 1 % ophthalmic suspension 1 drop in the morning, at noon, and at bedtime.   rosuvastatin (CRESTOR) 20 MG tablet Take 20 mg by mouth daily.   senna-docusate (SENOKOT S) 8.6-50 MG tablet Take 1 tablet by mouth 2 (two) times daily.   sodium chloride 1 g tablet Take 1 g by mouth 2 (two) times daily with a meal.   No facility-administered encounter medications on file as of 03/29/2023.     SIGNIFICANT DIAGNOSTIC EXAMS S  05-24-20: dexa: t score -2.387  NO NEW EXAMS.     LABS REVIEWED PREVIOUS     06-04-22: wbc 5.5; hgb 9.9; hct 31.6; mcv 92.4 plt 161; glucose 84; bun 19; creat 1.32; k+ 4.5; na++ 136; ca 9.0; gfr 42; phos 4.2; albumin 3.6; iron 33; tibc 162; ferritin 392; urine ACR: 0.19 09-10-22: wbc 6.5; hgb 10.5; hct 33.0; mcv 93.0 plt 181; glucose 113; bun 19; creat 1.27; k+ 4.6; na++ 133; ca 9.2; gfr 43; protein 7.4 albumin 4.1; vitamin B 12: 1756; folate >40; iron 63 tibc 191 10-01-22: chol 137; ldl 80; trig 53; hdl 46 11-02-22: wbc 6.3; hgb 9.1; hct 28.8; mcv 94.1 plt 136; glucose 78; bun 19; creat 1.44; k+ 3.9; na++ 135; ca 8.7; gfr 37; protein 6.0 albumin 3.3 11-05-22: ACR 0.17 11-06-22: PTH 25 ca 9.4 11-11-22; folate 32.8 vitamin B12: 459; iron 30; tibc 172; ferritin 452; serotonin 78 12-31-22: wbc 5.4; hgb 10.9; hct 34.9; mcv 93.8 plt 167; hgb A1c 4.9; vitamin  B12: 829; folate 39.3; vitamin D 59.03 01-27-23: wbc 7.2; hgb 11.3; hct 36.1; mcv 91.6 plt 202  NO NEW LABS.     Review of Systems  Constitutional:  Negative for malaise/fatigue.  Respiratory:  Negative for cough and shortness of breath.   Cardiovascular:  Negative for chest pain, palpitations and leg swelling.  Gastrointestinal:  Negative for abdominal pain, constipation and heartburn.  Musculoskeletal:  Negative for back pain, joint pain and myalgias.  Skin: Negative.   Neurological:  Negative for dizziness.  Psychiatric/Behavioral:  The patient is not nervous/anxious.       Physical Exam Constitutional:      General: She is not in acute distress.    Appearance: She is well-developed. She is not diaphoretic.  Neck:     Thyroid: No thyromegaly.  Cardiovascular:     Rate and Rhythm: Normal rate and regular rhythm.     Heart sounds: Normal heart sounds.  Pulmonary:     Effort: Pulmonary effort is normal. No respiratory distress.     Breath sounds: Normal breath sounds.  Abdominal:     General: Bowel sounds are normal. There is no distension.     Palpations: Abdomen is soft.     Tenderness: There is no abdominal tenderness.  Musculoskeletal:        General: Normal range of motion.     Cervical back: Neck supple.     Right lower leg: No edema.     Left lower leg: No edema.  Lymphadenopathy:     Cervical: No cervical adenopathy.  Skin:    General: Skin is warm and dry.  Neurological:     Mental Status: She is alert and oriented to person, place, and time.  Psychiatric:        Mood and Affect: Mood normal.  ASSESSMENT/ PLAN:  TODAY   Type 2 diabetes mellitus with stage 3 chronic kidney disease without long term current use of insulin: hgb A1c 5.3; is on asa 81 mg daily statin and arb   2. Hyperlipidemia associated with type 2 diabetes mellitus: ldl 63 is on crestor 10 mg daily   3. Anemia due to chronic renal failure with erythropoietin stage 3: hgb 10.5; will  continue iron daily is followed by oncology   PREVIOUS    4. Other chronic gastritis without hemorrhage: will continue prilosec 40 mg daily reglan 5 mg with meals.   5. Increased intraocular pressure bilateral: will continue trusopt to both eyes twice daily  6. Vitamin B 12 deficiency: level 459; folate 32.8   7. Vitamin D deficiency: level 41.14 will change to 1000 units daily   8. Thyroiditis: tsh 1.858 will monitor  9. Cervical myelopathy: is without change  10. Failure to thrive in adult: weight is 117 pounds; will continue to monitor   11. Aortic atherosclerosis: (ct 12-23-19) will monitor  12. History of dvt in left lower extremity: (02-02-20) induced by megace is on long term asa 81 mg daily   13. Chronic obstructive pulmonary disease unspecified COPD type: will monitor   14. Hypertension associated with stage 3 chronic kidney disease due to type 2 diabetes mellitus: b/p 148/75 will continue cozaar 25 mg daily   15. CKD stage 3 due to type 2 diabetes mellitus: bun 19; creat 1.27 gfr 43  16. Diabetic peripheral neuropathy associated with type 2 diabetes mellitus: is on gabapentin 200 mg twice daily     Synthia Innocent NP [facility]  call [phone]

## 2023-03-30 NOTE — Progress Notes (Unsigned)
[facility] [address], [place] [address]   CLINIC:  Medical Oncology/Hematology  PCP:  Sharee Holster, NP  [address] [place] [address] [phone]   REASON FOR VISIT:  Follow-up for anemia due to CKD stage IIIb   CURRENT THERAPY: IV iron & Retacrit   INTERVAL HISTORY:   [name] 77 y.o. female returns for routine follow-up of anemia.  She was last seen by Rojelio Brenner PA-C on 12/30/2022.    At today's visit, she reports feeling fair.  She denies any major changes in her health or symptoms since her last visit.  She continues to receive Retacrit 10,000 units each month.  She reports feeling improved energy since starting this treatment regimen.  She is tolerating Retacrit well and reports baseline blood pressure (BP today 160/72, but SBP is usually 120-130 at SNF); no symptoms of DVT or PE.   She has not noticed any blood loss such as hematemesis, hematochezia, melena, or epistaxis.   No pica, restless legs, or headaches. No chest pain, dyspnea on exertion, lightheadedness, or syncope.  She continues to take folic acid and Vitamin B12 630 mcg every other day.  She has 75% energy and 75% appetite. She endorses that she is maintaining a stable weight.  ASSESSMENT & PLAN:  1.  Normocytic anemia, secondary to CKD and relative iron deficiency - Etiology is from combination of CKD and relative iron deficiency. - Initial work-up included SPEP which was negative.  Stool was negative for occult blood.  Hemoglobin electrophoresis showed severe microcytosis.  She could have underlying thalassemia/sickle cell trait. - Last colonoscopy was in June 2019 in [place], 3 polyps removed. - She has a history of needing a blood transfusion in the early 2000's. -Iron supplement discontinued in June 2024 due to elevated ferritin. - She receives Retacrit 10,000 units once a month - She denies any bright red blood per rectum or melena      - Most recent labs  (03/24/2023): Hgb 9.8/MCV 93.5, ferritin 479, iron saturation 19% with low TIBC 173 - PLAN: No indication for IV iron or oral iron supplementation at this time. - Continue Retacrit 10,000 units monthly - overall Hgb at goal, but would consider increase if persistently Hgb < 10.0 -  RTC in 12 weeks for office visit and labs    2.  CKD stage IIIa/b - Ultrasound the kidneys on 08/22/2019 showed bilateral renal atrophy, severe atrophy of the right kidney. - Most recent CMP shows baseline CKD stage IIIb - PLAN: Continue follow-up with Dr. Wolfgang Phoenix   3.  B12 and folic acid deficiency: - She previously received B12 injections monthly at SNF - She is currently taking folic acid every other day + vitamin B12 500 mcg every other day.   - Labs (03/24/2023) showed vitamin B12 at goal (1068), normal MMA, normal folate. - PLAN: Continue vitamin B12 500 mcg EOD and folic acid supplements EOD - Will recheck levels at follow-up visit in 6 months (March 2025)   4.  History of lower extremity DVT - Venous duplex (02/02/2020): Extensive acute appearing left femoropopliteal occlusive DVT extending into the calf veins. - Considered provoked in the setting of immobility/bedbound functional status - Venous ultrasound (03/24/2022) shows a previous left-sided femoral, popliteal, and DVT from 2021 has completely resolved - Eliquis was discontinued by her PCP, placed on aspirin and statin - No current signs or symptoms of recurrent DVT     PLAN SUMMARY: >> CBC +  Retacrit every 4 weeks >> Labs in 3 months = CBC/D, CMP, ferritin, iron/TIBC >> OFFICE visit in 3 months (same day as labs)     REVIEW OF SYSTEMS:  Review of Systems  Constitutional:  Negative for appetite change, chills, diaphoresis, fatigue, fever and unexpected weight change.  HENT:   Negative for lump/mass and nosebleeds.   Eyes:  Negative for eye problems.  Respiratory:  Negative for cough, hemoptysis and shortness of breath.   Cardiovascular:   Negative for chest pain, leg swelling and palpitations.  Gastrointestinal:  Negative for abdominal pain, blood in stool, constipation, diarrhea, nausea and vomiting.  Genitourinary:  Negative for hematuria.   Skin: Negative.   Neurological:  Positive for numbness. Negative for dizziness, headaches and light-headedness.  Hematological:  Does not bruise/bleed easily.     PHYSICAL EXAM:  ECOG PERFORMANCE STATUS: 3 - Symptomatic, >50% confined to bed There were no vitals filed for this visit. There were no vitals filed for this visit. Physical Exam Constitutional:      Appearance: Normal appearance.     Comments: Presents in wheelchair  HENT:     Head: Normocephalic and atraumatic.     Mouth/Throat:     Mouth: Mucous membranes are moist.  Eyes:     Extraocular Movements: Extraocular movements intact.     Pupils: Pupils are equal, round, and reactive to light.  Cardiovascular:     Rate and Rhythm: Normal rate and regular rhythm.     Pulses: Normal pulses.     Heart sounds: Normal heart sounds.  Pulmonary:     Effort: Pulmonary effort is normal.     Breath sounds: Normal breath sounds.  Abdominal:     General: Bowel sounds are normal.     Palpations: Abdomen is soft.     Tenderness: There is no abdominal tenderness.  Musculoskeletal:        General: No swelling.     Right lower leg: No edema.     Left lower leg: No edema.  Lymphadenopathy:     Cervical: No cervical adenopathy.  Skin:    General: Skin is warm and dry.  Neurological:     General: No focal deficit present.     Mental Status: She is alert and oriented to person, place, and time.  Psychiatric:        Mood and Affect: Mood normal.        Behavior: Behavior normal.    PAST MEDICAL/SURGICAL HISTORY:  Past Medical History:  Diagnosis Date   Allergy    Anemia    pernicious anemia   Arthritis    Asthma    Blood transfusion without reported diagnosis    CKD (chronic kidney disease)    COPD (chronic  obstructive pulmonary disease) (HCC) 12/23/2019   Diabetic peripheral neuropathy associated with type 2 diabetes mellitus (HCC) 04/07/2021   DM (diabetes mellitus), type 2 with peripheral vascular complications (HCC) 12/23/2019   Gastritis    GERD (gastroesophageal reflux disease)    Glaucoma    History of blood in urine    Hyperlipidemia    Hypertension    Neuropathy    both feet   Ocular hypertension    Osteoporosis    Stroke (HCC)    Vitamin B12 deficiency anemia due to intrinsic factor deficiency    Past Surgical History:  Procedure Laterality Date   CATARACT EXTRACTION W/PHACO Right 11/27/2022   Procedure: CATARACT EXTRACTION PHACO AND INTRAOCULAR LENS PLACEMENT (IOC);  Surgeon: [name], MD;  [facility];  Service: Ophthalmology;  Laterality: Right;  CDE: 15.67   CATARACT EXTRACTION W/PHACO Left 12/18/2022   Procedure: CATARACT EXTRACTION PHACO AND INTRAOCULAR LENS PLACEMENT (IOC);  Surgeon: [name], MD;  Location: [facility];  Service: Ophthalmology;  Laterality: Left;  CDE: 11.78   CHOLECYSTECTOMY  2008   COLONOSCOPY  04/30/2011   Procedure: COLONOSCOPY;  Surgeon: Malissa Hippo, MD;  [facility];  Service: Endoscopy;  Laterality: N/A;   COLONOSCOPY  05/25/2012   Procedure: COLONOSCOPY;  Surgeon: Malissa Hippo, MD;  [facility];  Service: Endoscopy;  Laterality: N/A;  1:25-changed to 1200 Ann to notify pt   COLONOSCOPY Bilateral 12/2017   COLONOSCOPY WITH PROPOFOL N/A 11/29/2020   Procedure: COLONOSCOPY WITH PROPOFOL;  Surgeon: Dolores Frame, MD;  [facility];  Service: Gastroenterology;  Laterality: N/A;  1:35 [facility] is in the [facility]   ESOPHAGOGASTRODUODENOSCOPY N/A 12/07/2019   Procedure: ESOPHAGOGASTRODUODENOSCOPY (EGD);  Surgeon: Malissa Hippo, MD;  [facility];  Service: Endoscopy;  Laterality: N/A;  155   GALLBLADDER SURGERY     right breast cystectomy  1988   TUBAL LIGATION  1975     SOCIAL HISTORY:  Social History   Socioeconomic History   Marital status: Divorced    Spouse name: Not on file   Number of children: 4   Years of education: 9   Highest education level: Not on file  Occupational History   Occupation: retired   Tobacco Use   Smoking status: Former    Current packs/day: 0.00    Average packs/day: 1 pack/day for 40.0 years (40.0 ttl pk-yrs)    Types: Cigarettes    Start date: 04/03/1971    Quit date: 04/03/2011    Years since quitting: 11.9   Smokeless tobacco: Never  Vaping Use   Vaping status: Never Used  Substance and Sexual Activity   Alcohol use: No   Drug use: No   Sexual activity: Not Currently  Other Topics Concern   Not on file  Social History Narrative   01/17/20 lives at [facility], [place]   Social Determinants of Health   Financial Resource Strain: Not on file  Food Insecurity: Not on file  Transportation Needs: Not on file  Physical Activity: Not on file  Stress: Not on file  Social Connections: Not on file  Intimate Partner Violence: Not on file    FAMILY HISTORY:  Family History  Problem Relation Age of Onset   Diabetes Mother    Hyperlipidemia Mother    Hypertension Mother    Heart disease Father    Hyperlipidemia Father    Diabetes Sister    Hyperlipidemia Sister    Hypertension Sister    Cancer Brother    Heart disease Brother    Hyperlipidemia Brother    Hypertension Brother    Diabetes Sister     CURRENT MEDICATIONS:  Outpatient Encounter Medications as of 03/31/2023  Medication Sig   Amino Acids-Protein Hydrolys (PRO-STAT AWC PO) Take 30 mLs by mouth once.   aspirin EC 81 MG tablet Take 81 mg by mouth daily. Swallow whole.   brimonidine-timolol (COMBIGAN) 0.2-0.5 % ophthalmic solution Place 1 drop into both eyes every 12 (twelve) hours.   Calcium Carb-Cholecalciferol (CALCIUM 500 + D) 500-5 MG-MCG TABS Take 1 tablet by mouth daily.   cyanocobalamin (V-R VITAMIN B-12) 500 MCG tablet Take 1  tablet (500 mcg total) by mouth every other day.   dorzolamide (TRUSOPT)  2 % ophthalmic solution Place 1 drop into both eyes 2 (two) times daily. (0900 & 1700)   folic acid (V-R FOLIC ACID) 400 MCG tablet Take 1 tablet (400 mcg total) by mouth every other day.   gabapentin (NEURONTIN) 100 MG capsule Take 200 mg by mouth at bedtime. (2100)   gabapentin (NEURONTIN) 100 MG capsule Take 100 mg by mouth daily.   losartan (COZAAR) 25 MG tablet Take 25 mg by mouth daily.   metoCLOPramide (REGLAN) 5 MG tablet Take 1 tablet (5 mg total) by mouth 3 (three) times daily before meals.   moxifloxacin (VIGAMOX) 0.5 % ophthalmic solution Place 1 drop into the right eye 3 (three) times daily.   nepafenac (ILEVRO) 0.3 % ophthalmic suspension 1 drop daily.   NON FORMULARY Diet:Regular thin liquids  ALLERGIC TO FISH   omeprazole (PRILOSEC) 40 MG capsule Take 40 mg by mouth daily at 6 (six) AM. (0600)   prednisoLONE acetate (PRED FORTE) 1 % ophthalmic suspension 1 drop in the morning, at noon, and at bedtime.   rosuvastatin (CRESTOR) 20 MG tablet Take 20 mg by mouth daily.   senna-docusate (SENOKOT S) 8.6-50 MG tablet Take 1 tablet by mouth 2 (two) times daily.   sodium chloride 1 g tablet Take 1 g by mouth 2 (two) times daily with a meal.   No facility-administered encounter medications on file as of 03/31/2023.    ALLERGIES:  Allergies  Allergen Reactions   Latex Rash   Levofloxacin Nausea And Vomiting   Neomycin-Bacitracin-Polymyxin  [Bacitracin-Neomycin-Polymyxin] Rash   Other Swelling and Rash    TOMATO  SOUP-PER MAR   Prednisone Hives, Itching, Rash and Other (See Comments)   Fish Allergy     rash   Shellfish Allergy Hives   Neosporin [Neomycin-Polymyxin-Gramicidin] Itching and Rash    LABORATORY DATA:  I have reviewed the labs as listed.  CBC    Component Value Date/Time   WBC 6.2 03/24/2023 1344   RBC 3.37 (L) 03/24/2023 1344   HGB 9.8 (L) 03/24/2023 1344   HCT 31.5 (L) 03/24/2023 1344    PLT 235 03/24/2023 1344   MCV 93.5 03/24/2023 1344   MCH 29.1 03/24/2023 1344   MCHC 31.1 03/24/2023 1344   RDW 15.4 03/24/2023 1344   LYMPHSABS 1.8 03/24/2023 1344   MONOABS 0.4 03/24/2023 1344   EOSABS 0.5 03/24/2023 1344   BASOSABS 0.1 03/24/2023 1344      Latest Ref Rng & Units 03/24/2023    1:44 PM 02/23/2023    8:00 AM 11/06/2022    8:00 AM  CMP  Glucose 70 - 99 mg/dL 409  89    BUN 8 - 23 mg/dL 18  18    Creatinine 8.11 - 1.00 mg/dL 9.14  7.82    Sodium 956 - 145 mmol/L 134  133    Potassium 3.5 - 5.1 mmol/L 4.7  4.5    Chloride 98 - 111 mmol/L 105  102    CO2 22 - 32 mmol/L 24  24    Calcium 8.9 - 10.3 mg/dL 8.4  8.7    8.9  9.4   Total Protein 6.5 - 8.1 g/dL 6.9     Total Bilirubin 0.3 - 1.2 mg/dL 0.2     Alkaline Phos 38 - 126 U/L 70     AST 15 - 41 U/L 11     ALT 0 - 44 U/L 11       DIAGNOSTIC IMAGING:  I have independently reviewed the relevant imaging  and discussed with the patient.   WRAP UP:  All questions were answered. The patient knows to call the clinic with any problems, questions or concerns.  Medical decision making: Moderate  Time spent on visit: I spent 20 minutes counseling the patient face to face. The total time spent in the appointment was 30 minutes and more than 50% was on counseling.  Carnella Guadalajara, PA-C  03/31/23 10:27 AM

## 2023-03-31 ENCOUNTER — Inpatient Hospital Stay (HOSPITAL_BASED_OUTPATIENT_CLINIC_OR_DEPARTMENT_OTHER): Payer: Medicare Other | Admitting: Physician Assistant

## 2023-03-31 ENCOUNTER — Encounter: Payer: Self-pay | Admitting: Physician Assistant

## 2023-03-31 ENCOUNTER — Other Ambulatory Visit: Payer: Self-pay

## 2023-03-31 VITALS — BP 160/72 | HR 66 | Temp 99.3°F | Resp 20

## 2023-03-31 DIAGNOSIS — N1832 Chronic kidney disease, stage 3b: Secondary | ICD-10-CM

## 2023-03-31 DIAGNOSIS — E538 Deficiency of other specified B group vitamins: Secondary | ICD-10-CM | POA: Diagnosis not present

## 2023-03-31 DIAGNOSIS — E611 Iron deficiency: Secondary | ICD-10-CM | POA: Diagnosis not present

## 2023-03-31 DIAGNOSIS — D631 Anemia in chronic kidney disease: Secondary | ICD-10-CM | POA: Diagnosis not present

## 2023-03-31 DIAGNOSIS — Z7982 Long term (current) use of aspirin: Secondary | ICD-10-CM | POA: Diagnosis not present

## 2023-03-31 DIAGNOSIS — Z79899 Other long term (current) drug therapy: Secondary | ICD-10-CM | POA: Diagnosis not present

## 2023-03-31 DIAGNOSIS — Z87891 Personal history of nicotine dependence: Secondary | ICD-10-CM | POA: Diagnosis not present

## 2023-03-31 DIAGNOSIS — Z86718 Personal history of other venous thrombosis and embolism: Secondary | ICD-10-CM | POA: Diagnosis not present

## 2023-03-31 DIAGNOSIS — Z7901 Long term (current) use of anticoagulants: Secondary | ICD-10-CM | POA: Diagnosis not present

## 2023-03-31 NOTE — Patient Instructions (Signed)
[facility] at [facility] **VISIT SUMMARY & IMPORTANT INSTRUCTIONS **   You were seen today by Rojelio Brenner PA-C for your anemia.  Your blood levels look much better now! You do not need any IV iron at this time. We will schedule you for blood check and Retacrit injection every 4 weeks. You should CONTINUE taking folic acid 400 mcg every other day and vitamin B12 (cyanocobalamin) 500 mcg every other day.  FOLLOW-UP APPOINTMENT: Office visit in 3 months  ** Thank you for trusting me with your healthcare!  I strive to provide all of my patients with quality care at each visit.  If you receive a survey for this visit, I would be so grateful to you for taking the time to provide feedback.  Thank you in advance!  ~ Ekin Pilar                   Dr. Doreatha Massed   &   Rojelio Brenner, PA-C   - - - - - - - - - - - - - - - - - -    Thank you for choosing [facility] at [facility] to provide your oncology and hematology care.  To afford each patient quality time with our provider, please arrive at least 15 minutes before your scheduled appointment time.   If you have a lab appointment with the Cancer Center please come in thru the Main Entrance and check in at the main information desk.  You need to re-schedule your appointment should you arrive 10 or more minutes late.  We strive to give you quality time with our providers, and arriving late affects you and other patients whose appointments are after yours.  Also, if you no show three or more times for appointments you may be dismissed from the clinic at the providers discretion.     Again, thank you for choosing [facility].  Our hope is that these requests will decrease the amount of time that you wait before being seen by our physicians.       _____________________________________________________________  Should you have questions after your visit to [facility],  please contact our office at [phone] and follow the prompts.  Our office hours are 8:00 a.m. and 4:30 p.m. Monday - Friday.  Please note that voicemails left after 4:00 p.m. may not be returned until the following business day.  We are closed weekends and major holidays.  You do have access to a nurse 24-7, just call the main number to the clinic [phone] and do not press any options, hold on the line and a nurse will answer the phone.    For prescription refill requests, have your pharmacy contact our office and allow 72 hours.

## 2023-04-01 ENCOUNTER — Encounter: Payer: Self-pay | Admitting: Adult Health

## 2023-04-01 ENCOUNTER — Non-Acute Institutional Stay (SKILLED_NURSING_FACILITY): Payer: Medicare Other | Admitting: Adult Health

## 2023-04-01 DIAGNOSIS — K2950 Unspecified chronic gastritis without bleeding: Secondary | ICD-10-CM

## 2023-04-01 NOTE — Progress Notes (Signed)
Location:  [facility] Room Number: 150 Place of Service:  SNF (31)   CODE STATUS: DNR  Allergies  Allergen Reactions   Latex Rash   Levofloxacin Nausea And Vomiting   Neomycin-Bacitracin-Polymyxin  [Bacitracin-Neomycin-Polymyxin] Rash   Other Swelling and Rash    TOMATO  SOUP-PER MAR   Prednisone Hives, Itching, Rash and Other (See Comments)   Fish Allergy     rash   Shellfish Allergy Hives   Neosporin [Neomycin-Polymyxin-Gramicidin] Itching and Rash    Chief Complaint  Patient presents with   Acute Visit    medication issues    HPI:  She is presently taking reglan 2.5 mg four times daily. She denies any nausea or heart burn. She denies any problems with constipation or diarrhea.   Past Medical History:  Diagnosis Date   Allergy    Anemia    pernicious anemia   Arthritis    Asthma    Blood transfusion without reported diagnosis    CKD (chronic kidney disease)    COPD (chronic obstructive pulmonary disease) (HCC) 12/23/2019   Diabetic peripheral neuropathy associated with type 2 diabetes mellitus (HCC) 04/07/2021   DM (diabetes mellitus), type 2 with peripheral vascular complications (HCC) 12/23/2019   Gastritis    GERD (gastroesophageal reflux disease)    Glaucoma    History of blood in urine    Hyperlipidemia    Hypertension    Neuropathy    both feet   Ocular hypertension    Osteoporosis    Stroke (HCC)    Vitamin B12 deficiency anemia due to intrinsic factor deficiency     Past Surgical History:  Procedure Laterality Date   CATARACT EXTRACTION W/PHACO Right 11/27/2022   Procedure: CATARACT EXTRACTION PHACO AND INTRAOCULAR LENS PLACEMENT (IOC);  Surgeon: [name], MD;  Location: [facility];  Service: Ophthalmology;  Laterality: Right;  CDE: 15.67   CATARACT EXTRACTION W/PHACO Left 12/18/2022   Procedure: CATARACT EXTRACTION PHACO AND INTRAOCULAR LENS PLACEMENT (IOC);  Surgeon: [name], MD;  Location: [facility];  Service:  Ophthalmology;  Laterality: Left;  CDE: 11.78   CHOLECYSTECTOMY  2008   COLONOSCOPY  04/30/2011   Procedure: COLONOSCOPY;  Surgeon: Malissa Hippo, MD;  Location: [facility];  Service: Endoscopy;  Laterality: N/A;   COLONOSCOPY  05/25/2012   Procedure: COLONOSCOPY;  Surgeon: Malissa Hippo, MD;  Location: [facility];  Service: Endoscopy;  Laterality: N/A;  1:25-changed to 1200 Ann to notify pt   COLONOSCOPY Bilateral 12/2017   COLONOSCOPY WITH PROPOFOL N/A 11/29/2020   Procedure: COLONOSCOPY WITH PROPOFOL;  Surgeon: Dolores Frame, MD;  Location: [facility];  Service: Gastroenterology;  Laterality: N/A;  1:35 [facility] in the [facility]   ESOPHAGOGASTRODUODENOSCOPY N/A 12/07/2019   Procedure: ESOPHAGOGASTRODUODENOSCOPY (EGD);  Surgeon: Malissa Hippo, MD;  Location: [facility];  Service: Endoscopy;  Laterality: N/A;  155   GALLBLADDER SURGERY     right breast cystectomy  1988   TUBAL LIGATION  1975    Social History   Socioeconomic History   Marital status: Divorced    Spouse name: Not on file   Number of children: 4   Years of education: 9   Highest education level: Not on file  Occupational History   Occupation: retired   Tobacco Use   Smoking status: Former    Current packs/day: 0.00    Average packs/day: 1 pack/day for 40.0 years (40.0 ttl pk-yrs)    Types: Cigarettes  Start date: 04/03/1971    Quit date: 04/03/2011    Years since quitting: 12.0   Smokeless tobacco: Never  Vaping Use   Vaping status: Never Used  Substance and Sexual Activity   Alcohol use: No   Drug use: No   Sexual activity: Not Currently  Other Topics Concern   Not on file  Social History Narrative   [birth year] lives at [facility], [place]   Social Determinants of Health   Financial Resource Strain: Not on file  Food Insecurity: Not on file  Transportation Needs: Not on file  Physical Activity: Not on file  Stress: Not on file  Social Connections: Not on file   Intimate Partner Violence: Not on file   Family History  Problem Relation Age of Onset   Diabetes Mother    Hyperlipidemia Mother    Hypertension Mother    Heart disease Father    Hyperlipidemia Father    Diabetes Sister    Hyperlipidemia Sister    Hypertension Sister    Cancer Brother    Heart disease Brother    Hyperlipidemia Brother    Hypertension Brother    Diabetes Sister       VITAL SIGNS BP 132/80   Pulse 60   Temp (!) 97.2 F (36.2 C)   Resp 17   Ht 5\' 4"  (1.626 m)   Wt 116 lb 3.2 oz (52.7 kg)   SpO2 98%   BMI 19.95 kg/m   Outpatient Encounter Medications as of 04/01/2023  Medication Sig   aspirin EC 81 MG tablet Take 81 mg by mouth daily. Swallow whole.   brimonidine-timolol (COMBIGAN) 0.2-0.5 % ophthalmic solution Place 1 drop into both eyes every 12 (twelve) hours.   brinzolamide (AZOPT) 1 % ophthalmic suspension 1 drop in the morning and at bedtime.   Calcium Carb-Cholecalciferol (CALCIUM 500 + D) 500-5 MG-MCG TABS Take 1 tablet by mouth daily.   cyanocobalamin (V-R VITAMIN B-12) 500 MCG tablet Take 1 tablet (500 mcg total) by mouth every other day.   folic acid (V-R FOLIC ACID) 400 MCG tablet Take 1 tablet (400 mcg total) by mouth every other day.   gabapentin (NEURONTIN) 100 MG capsule Take 200 mg by mouth at bedtime. (2100)   gabapentin (NEURONTIN) 100 MG capsule Take 100 mg by mouth daily.   losartan (COZAAR) 25 MG tablet Take 25 mg by mouth daily.   metoCLOPramide (REGLAN) 5 MG tablet Take 1 tablet (5 mg total) by mouth 3 (three) times daily before meals.   NON FORMULARY Diet:Regular thin liquids  ALLERGIC TO FISH   omeprazole (PRILOSEC) 40 MG capsule Take 40 mg by mouth daily at 6 (six) AM. (0600)   rosuvastatin (CRESTOR) 20 MG tablet Take 20 mg by mouth daily.   senna-docusate (SENOKOT S) 8.6-50 MG tablet Take 1 tablet by mouth 2 (two) times daily.   sodium chloride 1 g tablet Take 1 g by mouth 2 (two) times daily with a meal.   [DISCONTINUED]  Amino Acids-Protein Hydrolys (PRO-STAT AWC PO) Take 30 mLs by mouth once. (Patient not taking: Reported on 03/31/2023)   [DISCONTINUED] dorzolamide (TRUSOPT) 2 % ophthalmic solution Place 1 drop into both eyes 2 (two) times daily. (0900 & 1700) (Patient not taking: Reported on 03/31/2023)   [DISCONTINUED] moxifloxacin (VIGAMOX) 0.5 % ophthalmic solution Place 1 drop into the right eye 3 (three) times daily. (Patient not taking: Reported on 03/31/2023)   [DISCONTINUED] nepafenac (ILEVRO) 0.3 % ophthalmic suspension 1 drop daily. (Patient not taking:  Reported on 03/31/2023)   [DISCONTINUED] prednisoLONE acetate (PRED FORTE) 1 % ophthalmic suspension 1 drop in the morning, at noon, and at bedtime. (Patient not taking: Reported on 03/31/2023)   No facility-administered encounter medications on file as of 04/01/2023.     SIGNIFICANT DIAGNOSTIC EXAMS   05-24-20: dexa: t score -2.387  NO NEW EXAMS.     LABS REVIEWED PREVIOUS     06-04-22: wbc 5.5; hgb 9.9; hct 31.6; mcv 92.4 plt 161; glucose 84; bun 19; creat 1.32; k+ 4.5; na++ 136; ca 9.0; gfr 42; phos 4.2; albumin 3.6; iron 33; tibc 162; ferritin 392; urine ACR: 0.19 09-10-22: wbc 6.5; hgb 10.5; hct 33.0; mcv 93.0 plt 181; glucose 113; bun 19; creat 1.27; k+ 4.6; na++ 133; ca 9.2; gfr 43; protein 7.4 albumin 4.1; vitamin B 12: 1756; folate >40; iron 63 tibc 191 10-01-22: chol 137; ldl 80; trig 53; hdl 46 11-02-22: wbc 6.3; hgb 9.1; hct 28.8; mcv 94.1 plt 136; glucose 78; bun 19; creat 1.44; k+ 3.9; na++ 135; ca 8.7; gfr 37; protein 6.0 albumin 3.3 11-05-22: ACR 0.17 11-06-22: PTH 25 ca 9.4 11-11-22; folate 32.8 vitamin B12: 459; iron 30; tibc 172; ferritin 452; serotonin 78 12-31-22: wbc 5.4; hgb 10.9; hct 34.9; mcv 93.8 plt 167; hgb A1c 4.9; vitamin B12: 829; folate 39.3; vitamin D 59.03 01-27-23: wbc 7.2; hgb 11.3; hct 36.1; mcv 91.6 plt 202  NO NEW LABS.    Review of Systems  Constitutional:  Negative for malaise/fatigue.  Respiratory:  Negative for  cough and shortness of breath.   Cardiovascular:  Negative for chest pain, palpitations and leg swelling.  Gastrointestinal:  Negative for abdominal pain, constipation and heartburn.  Musculoskeletal:  Negative for back pain, joint pain and myalgias.  Skin: Negative.   Neurological:  Negative for dizziness.  Psychiatric/Behavioral:  The patient is not nervous/anxious.    Physical Exam Constitutional:      General: She is not in acute distress.    Appearance: She is well-developed. She is not diaphoretic.  Neck:     Thyroid: No thyromegaly.  Cardiovascular:     Rate and Rhythm: Normal rate and regular rhythm.     Pulses: Normal pulses.     Heart sounds: Normal heart sounds.  Pulmonary:     Effort: Pulmonary effort is normal. No respiratory distress.     Breath sounds: Normal breath sounds.  Abdominal:     General: Bowel sounds are normal. There is no distension.     Palpations: Abdomen is soft.     Tenderness: There is no abdominal tenderness.  Musculoskeletal:        General: Normal range of motion.     Cervical back: Neck supple.     Right lower leg: No edema.     Left lower leg: No edema.  Lymphadenopathy:     Cervical: No cervical adenopathy.  Skin:    General: Skin is warm and dry.  Neurological:     Mental Status: She is alert and oriented to person, place, and time.  Psychiatric:        Mood and Affect: Mood normal.       ASSESSMENT/ PLAN:  TODAY  Other chronic gastritis without bleeding: will lower her reglan to twice daily; if she tolerates this change will stop this medication in the future.    Synthia Innocent NP [facility]  call [phone]

## 2023-04-02 ENCOUNTER — Other Ambulatory Visit: Payer: Medicare Other

## 2023-04-08 ENCOUNTER — Ambulatory Visit: Payer: Medicare Other | Admitting: Oncology

## 2023-04-15 ENCOUNTER — Non-Acute Institutional Stay (SKILLED_NURSING_FACILITY): Payer: Medicare Other | Admitting: Internal Medicine

## 2023-04-15 ENCOUNTER — Encounter: Payer: Self-pay | Admitting: Internal Medicine

## 2023-04-15 DIAGNOSIS — E1142 Type 2 diabetes mellitus with diabetic polyneuropathy: Secondary | ICD-10-CM

## 2023-04-15 DIAGNOSIS — I10 Essential (primary) hypertension: Secondary | ICD-10-CM | POA: Diagnosis not present

## 2023-04-15 DIAGNOSIS — R6889 Other general symptoms and signs: Secondary | ICD-10-CM

## 2023-04-15 DIAGNOSIS — N183 Chronic kidney disease, stage 3 unspecified: Secondary | ICD-10-CM | POA: Diagnosis not present

## 2023-04-15 DIAGNOSIS — D631 Anemia in chronic kidney disease: Secondary | ICD-10-CM | POA: Diagnosis not present

## 2023-04-15 NOTE — Progress Notes (Unsigned)
NURSING HOME LOCATION:  [facility] ROOM NUMBER:  [id number]  CODE STATUS:  DNR  PCP:  Synthia Innocent NP  This is a nursing facility follow up visit of chronic medical issues &  to document compliance with Regulation 483.30 (c) in The Long Term Care Survey Manual Phase 2 which mandates caregiver visit ( visits can alternate among physician, PA or NP as per statutes) within 10 days of 30 days / 60 days/ 90 days post admission to SNF date    Interim medical record and care since last SNF visit was updated with review of diagnostic studies and change in clinical status since last visit were documented.  HPI: She is a permanent resident of this facility with medical diagnoses of history of B12 anemia, history of asthma, diabetes with peripheral neuropathy, COPD, history of gastritis, GERD, glaucoma, dyslipidemia, essential hypertension, history of stroke, and osteoporosis.  Labs are current & and reveal a creatinine of 1.25 and GFR 44.  Since July 11, 2022 the creatinine has ranged from the 1.26 up to 1.44.  eGFR has ranged from the current 44 down to 37.  During the same.  The time the anemia has ranged from an H/H of 11.5/35.9.  Current values of 9.8/31.5.  Her most recent A1c was 4.9% on 12/31/2022. She was seen by oncology 9/18 and Retacrit was recommended monthly based on hemoglobin.  Her last nephrology follow-up was 4/29.  She was diagnosed with bilateral renal atrophy greater on the right than the left with low familiar mass as etiology for her CKD. Last TSH was therapeutic @ 1.858.on 02/05/22.   Review of systems: She denies any acute symptoms.  Chronic symptoms include cold intolerance. She has chronic neuropathy in her feet.  Constitutional: No fever, significant weight change, fatigue  Eyes: No redness, discharge, pain, vision change ENT/mouth: No nasal congestion,  purulent discharge, earache, change in hearing, sore throat  Cardiovascular: No chest pain, palpitations,  paroxysmal nocturnal dyspnea, claudication, edema  Respiratory: No cough, sputum production, hemoptysis, DOE, significant snoring, apnea   Gastrointestinal: No heartburn, dysphagia, abdominal pain, nausea /vomiting, rectal bleeding, melena, change in bowels Genitourinary: No dysuria, hematuria, pyuria, incontinence, nocturia Musculoskeletal: No joint stiffness, joint swelling, weakness, pain Dermatologic: No rash, pruritus, change in appearance of skin Neurologic: No dizziness, headache, syncope, seizures, numbness, tingling Psychiatric: No significant anxiety, depression, insomnia, anorexia Endocrine: No change in hair/skin/nails, excessive thirst, excessive hunger, excessive urination  Hematologic/lymphatic: No significant bruising, lymphadenopathy, abnormal bleeding Allergy/immunology: No itchy/watery eyes, significant sneezing, urticaria, angioedema  Physical exam:  Pertinent or positive findings: She is thin and appears suboptimally nourished.  As is always the case she is heavily closed wearing soles and coverlets abnormalities.  She is edentulous.  The second heart sound is increased.  Pedal pulses are decreased.  Limb atrophy is suggested. General appearance: Adequately nourished; no acute distress, increased work of breathing is present.   Lymphatic: No lymphadenopathy about the head, neck, axilla. Eyes: No conjunctival inflammation or lid edema is present. There is no scleral icterus. Ears:  External ear exam shows no significant lesions or deformities.   Nose:  External nasal examination shows no deformity or inflammation. Nasal mucosa are pink and moist without lesions, exudates Oral exam:  Lips and gums are healthy appearing. There is no oropharyngeal erythema or exudate. Neck:  No thyromegaly, masses, tenderness noted.    Heart:  Normal rate and regular rhythm. S1 and S2 normal without gallop, murmur, click, rub .  Lungs: Chest  clear to auscultation without wheezes, rhonchi,  rales, rubs. Abdomen: Bowel sounds are normal. Abdomen is soft and nontender with no organomegaly, hernias, masses. GU: Deferred  Extremities:  No cyanosis, clubbing, edema  Neurologic exam : Cn 2-7 intact Strength equal  in upper & lower extremities Balance, Rhomberg, finger to nose testing could not be completed due to clinical state Deep tendon reflexes are equal Skin: Warm & dry w/o tenting. No significant lesions or rash.  See summary under each active problem in the Problem List with associated updated therapeutic plan

## 2023-04-16 NOTE — Assessment & Plan Note (Signed)
Neuropathic symptoms stable.  Current A1c is prediabetic at 4.9% as of 12/31/2022.  No change indicated.

## 2023-04-16 NOTE — Assessment & Plan Note (Addendum)
04/15/23 BP 149/69 on low dose Losartan 25 mg every day. Recorded BPs range from low of 103/59 to high of 160/72, both outliers. Protocol to determine BP average will be initiated.

## 2023-04-16 NOTE — Assessment & Plan Note (Addendum)
Current H/H 9.8/31.5.  Retacrit infusion monthly. Current creatinine is 1.26 with an EGFR of 44 indicating high stage IIIb CKD.    Med list reviewed; no indication for change in meds or dosage.  Continue to monitor. She is aware of the scheduled Nephrology follow-up with Dr. Wolfgang Phoenix.

## 2023-04-16 NOTE — Assessment & Plan Note (Signed)
TSH 1.858 on 02/05/22; update indicated as cold intolerance persists.

## 2023-04-16 NOTE — Assessment & Plan Note (Deleted)
Current creatinine is 1.26 with an EGFR of 44 indicating high stage IIIb CKD.  Her most recent A1c was prediabetic at 4.9% on 12/31/2022.  Med list reviewed; no indication for change in meds or dosage.  Continue to monitor. She is aware of the schedule nephrology follow-up with Dr. Wolfgang Phoenix.

## 2023-04-16 NOTE — Patient Instructions (Signed)
See assessment and plan under each diagnosis in the problem list and acutely for this visit 

## 2023-04-20 DIAGNOSIS — Z23 Encounter for immunization: Secondary | ICD-10-CM | POA: Diagnosis not present

## 2023-04-23 ENCOUNTER — Inpatient Hospital Stay: Payer: Medicare Other

## 2023-04-23 ENCOUNTER — Inpatient Hospital Stay: Payer: Medicare Other | Attending: Hematology

## 2023-04-23 VITALS — BP 164/82 | HR 73 | Temp 97.6°F

## 2023-04-23 DIAGNOSIS — D631 Anemia in chronic kidney disease: Secondary | ICD-10-CM

## 2023-04-23 DIAGNOSIS — E611 Iron deficiency: Secondary | ICD-10-CM | POA: Insufficient documentation

## 2023-04-23 DIAGNOSIS — N1832 Chronic kidney disease, stage 3b: Secondary | ICD-10-CM | POA: Diagnosis not present

## 2023-04-23 DIAGNOSIS — E538 Deficiency of other specified B group vitamins: Secondary | ICD-10-CM

## 2023-04-23 LAB — CBC
HCT: 34 % — ABNORMAL LOW (ref 36.0–46.0)
Hemoglobin: 10.4 g/dL — ABNORMAL LOW (ref 12.0–15.0)
MCH: 29.3 pg (ref 26.0–34.0)
MCHC: 30.6 g/dL (ref 30.0–36.0)
MCV: 95.8 fL (ref 80.0–100.0)
Platelets: 143 10*3/uL — ABNORMAL LOW (ref 150–400)
RBC: 3.55 MIL/uL — ABNORMAL LOW (ref 3.87–5.11)
RDW: 15.4 % (ref 11.5–15.5)
WBC: 7.4 10*3/uL (ref 4.0–10.5)
nRBC: 0 % (ref 0.0–0.2)

## 2023-04-23 MED ORDER — EPOETIN ALFA-EPBX 10000 UNIT/ML IJ SOLN
10000.0000 [IU] | Freq: Once | INTRAMUSCULAR | Status: AC
  Administered 2023-04-23: 10000 [IU] via SUBCUTANEOUS
  Filled 2023-04-23: qty 1

## 2023-04-23 NOTE — Progress Notes (Signed)
Pt here for retacrit injection. Hgb 10.4. Pt tolerated retacrit injection well with no complications. Injection site WNL pt stable at discharge and all questions answered at this time. Pt discharge from clinic via wheelchair in stable condition. Follow up as scheduled. AVS printed and handed to patient.   Traci Parker [employer]

## 2023-04-23 NOTE — Patient Instructions (Signed)

## 2023-05-03 DIAGNOSIS — Z23 Encounter for immunization: Secondary | ICD-10-CM | POA: Diagnosis not present

## 2023-05-06 DIAGNOSIS — Z961 Presence of intraocular lens: Secondary | ICD-10-CM | POA: Diagnosis not present

## 2023-05-06 DIAGNOSIS — H401132 Primary open-angle glaucoma, bilateral, moderate stage: Secondary | ICD-10-CM | POA: Diagnosis not present

## 2023-05-10 DIAGNOSIS — Z23 Encounter for immunization: Secondary | ICD-10-CM | POA: Diagnosis not present

## 2023-05-14 ENCOUNTER — Encounter: Payer: Self-pay | Admitting: Adult Health

## 2023-05-14 ENCOUNTER — Non-Acute Institutional Stay (SKILLED_NURSING_FACILITY): Payer: Medicare Other | Admitting: Adult Health

## 2023-05-14 DIAGNOSIS — I129 Hypertensive chronic kidney disease with stage 1 through stage 4 chronic kidney disease, or unspecified chronic kidney disease: Secondary | ICD-10-CM

## 2023-05-14 DIAGNOSIS — E1122 Type 2 diabetes mellitus with diabetic chronic kidney disease: Secondary | ICD-10-CM | POA: Diagnosis not present

## 2023-05-14 DIAGNOSIS — J449 Chronic obstructive pulmonary disease, unspecified: Secondary | ICD-10-CM | POA: Diagnosis not present

## 2023-05-14 DIAGNOSIS — N183 Chronic kidney disease, stage 3 unspecified: Secondary | ICD-10-CM

## 2023-05-14 DIAGNOSIS — G959 Disease of spinal cord, unspecified: Secondary | ICD-10-CM

## 2023-05-14 NOTE — Progress Notes (Unsigned)
Location:  [facility] Room Number: 150 Place of Service:  SNF (31)   CODE STATUS: DNR  Allergies  Allergen Reactions   Latex Rash   Levofloxacin Nausea And Vomiting   Neomycin-Bacitracin-Polymyxin  [Bacitracin-Neomycin-Polymyxin] Rash   Other Swelling and Rash    TOMATO  SOUP-PER MAR   Prednisone Hives, Itching, Rash and Other (See Comments)   Fish Allergy     rash   Shellfish Allergy Hives   Neosporin [Neomycin-Polymyxin-Gramicidin] Itching and Rash    Chief Complaint  Patient presents with   Acute Visit    care plan meeting.    HPI:  We have come together for her care plan meeting. BIMS 15/15 mood 0/30. She is nonambulatory without falls. She requires moderate to dependent assist with her adl care. She is incontinent of bladder and bowel. Dietary: requires setup for meals; regular diet: appetite 51-100%; weight is 116.6 pounds. Therapy none at this time. 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 She continues to be followed for her chronic illnesses including: Hypertension associated with stage 3 chronic kidney disease due to type 2 diabetes mellitus   Chronic obstructive pulmonary disease unspecified COPD type    Cervical myelopathy   Past Medical History:  Diagnosis Date   Allergy    Anemia    pernicious anemia   Arthritis    Asthma    Blood transfusion without reported diagnosis    CKD (chronic kidney disease)    COPD (chronic obstructive pulmonary disease) (HCC) 12/23/2019   Diabetic peripheral neuropathy associated with type 2 diabetes mellitus (HCC) 04/07/2021   DM (diabetes mellitus), type 2 with peripheral vascular complications (HCC) 12/23/2019   Gastritis    GERD (gastroesophageal reflux disease)    Glaucoma    History of blood in urine    Hyperlipidemia    Hypertension    Neuropathy    both feet   Ocular hypertension    Osteoporosis    Stroke (HCC)    Vitamin B12 deficiency anemia due to intrinsic factor deficiency     Past  Surgical History:  Procedure Laterality Date   CATARACT EXTRACTION W/PHACO Right 11/27/2022   Procedure: CATARACT EXTRACTION PHACO AND INTRAOCULAR LENS PLACEMENT (IOC);  Surgeon: [name], MD;  [facility]: [facility];  Service: Ophthalmology;  Laterality: Right;  CDE: 15.67   CATARACT EXTRACTION W/PHACO Left 12/18/2022   Procedure: CATARACT EXTRACTION PHACO AND INTRAOCULAR LENS PLACEMENT (IOC);  Surgeon: [name], MD;  Location: [facility];  Service: Ophthalmology;  Laterality: Left;  CDE: 11.78   CHOLECYSTECTOMY  2008   COLONOSCOPY  04/30/2011   Procedure: COLONOSCOPY;  Surgeon: Malissa Hippo, MD;  [facility];  Service: Endoscopy;  Laterality: N/A;   COLONOSCOPY  05/25/2012   Procedure: COLONOSCOPY;  Surgeon: Malissa Hippo, MD;  [facility];  Service: Endoscopy;  Laterality: N/A;  1:25-changed to 1200 Ann to notify pt   COLONOSCOPY Bilateral 12/2017   COLONOSCOPY WITH PROPOFOL N/A 11/29/2020   Procedure: COLONOSCOPY WITH PROPOFOL;  Surgeon: Dolores Frame, MD;  [facility];  Service: Gastroenterology;  Laterality: N/A;  1:35 [facility] is in the [facility]   ESOPHAGOGASTRODUODENOSCOPY N/A 12/07/2019   Procedure: ESOPHAGOGASTRODUODENOSCOPY (EGD);  Surgeon: Malissa Hippo, MD;  [facility];  Service: Endoscopy;  Laterality: N/A;  155   GALLBLADDER SURGERY     right breast cystectomy  1988   TUBAL LIGATION  1975    Social History   Socioeconomic History  Marital status: Divorced    Spouse name: Not on file   Number of children: 4   Years of education: 9   Highest education level: Not on file  Occupational History   Occupation: retired   Tobacco Use   Smoking status: Former    Current packs/day: 0.00    Average packs/day: 1 pack/day for 40.0 years (40.0 ttl pk-yrs)    Types: Cigarettes    Start date: 04/03/1971    Quit date: 04/03/2011    Years since quitting: 12.1   Smokeless tobacco: Never  Vaping Use   Vaping  status: Never Used  Substance and Sexual Activity   Alcohol use: No   Drug use: No   Sexual activity: Not Currently  Other Topics Concern   Not on file  Social History Narrative   01/17[birth year] lives at [facility], [place]   Social Determinants of Health   Financial Resource Strain: Not on file  Food Insecurity: Not on file  Transportation Needs: Not on file  Physical Activity: Not on file  Stress: Not on file  Social Connections: Not on file  Intimate Partner Violence: Not on file   Family History  Problem Relation Age of Onset   Diabetes Mother    Hyperlipidemia Mother    Hypertension Mother    Heart disease Father    Hyperlipidemia Father    Diabetes Sister    Hyperlipidemia Sister    Hypertension Sister    Cancer Brother    Heart disease Brother    Hyperlipidemia Brother    Hypertension Brother    Diabetes Sister       VITAL SIGNS BP (!) 147/57   Pulse 64   Temp (!) 97.3 F (36.3 C)   Resp 18   Ht 5\' 4"  (1.626 m)   Wt 123 lb 3.2 oz (55.9 kg)   SpO2 99%   BMI 21.15 kg/m   Outpatient Encounter Medications as of 05/14/2023  Medication Sig   aspirin EC 81 MG tablet Take 81 mg by mouth daily. Swallow whole.   brimonidine-timolol (COMBIGAN) 0.2-0.5 % ophthalmic solution Place 1 drop into both eyes every 12 (twelve) hours.   Calcium Carb-Cholecalciferol (CALCIUM 500 + D) 500-5 MG-MCG TABS Take 1 tablet by mouth daily.   cyanocobalamin (V-R VITAMIN B-12) 500 MCG tablet Take 1 tablet (500 mcg total) by mouth every other day.   folic acid (V-R FOLIC ACID) 400 MCG tablet Take 1 tablet (400 mcg total) by mouth every other day.   gabapentin (NEURONTIN) 100 MG capsule Take 200 mg by mouth at bedtime. (2100)   gabapentin (NEURONTIN) 100 MG capsule Take 100 mg by mouth daily.   losartan (COZAAR) 25 MG tablet Take 25 mg by mouth daily.   metoCLOPramide (REGLAN) 5 MG tablet Take 1 tablet (5 mg total) by mouth 3 (three) times daily before meals.   NON FORMULARY Diet:Regular  thin liquids  ALLERGIC TO FISH   omeprazole (PRILOSEC) 40 MG capsule Take 40 mg by mouth daily at 6 (six) AM. (0600)   rosuvastatin (CRESTOR) 20 MG tablet Take 20 mg by mouth daily.   senna-docusate (SENOKOT S) 8.6-50 MG tablet Take 1 tablet by mouth 2 (two) times daily.   sodium chloride 1 g tablet Take 1 g by mouth 2 (two) times daily with a meal.   brinzolamide (AZOPT) 1 % ophthalmic suspension 1 drop in the morning and at bedtime. (Patient not taking: Reported on 05/14/2023)   No facility-administered encounter medications on file as  of 05/14/2023.     SIGNIFICANT DIAGNOSTIC EXAMS  05-24-20: dexa: t score -2.387  NO NEW EXAMS.     LABS REVIEWED PREVIOUS     06-04-22: wbc 5.5; hgb 9.9; hct 31.6; mcv 92.4 plt 161; glucose 84; bun 19; creat 1.32; k+ 4.5; na++ 136; ca 9.0; gfr 42; phos 4.2; albumin 3.6; iron 33; tibc 162; ferritin 392; urine ACR: 0.19 09-10-22: wbc 6.5; hgb 10.5; hct 33.0; mcv 93.0 plt 181; glucose 113; bun 19; creat 1.27; k+ 4.6; na++ 133; ca 9.2; gfr 43; protein 7.4 albumin 4.1; vitamin B 12: 1756; folate >40; iron 63 tibc 191 10-01-22: chol 137; ldl 80; trig 53; hdl 46 11-02-22: wbc 6.3; hgb 9.1; hct 28.8; mcv 94.1 plt 136; glucose 78; bun 19; creat 1.44; k+ 3.9; na++ 135; ca 8.7; gfr 37; protein 6.0 albumin 3.3 11-05-22: ACR 0.17 11-06-22: PTH 25 ca 9.4 11-11-22; folate 32.8 vitamin B12: 459; iron 30; tibc 172; ferritin 452; serotonin 78 12-31-22: wbc 5.4; hgb 10.9; hct 34.9; mcv 93.8 plt 167; hgb A1c 4.9; vitamin B12: 829; folate 39.3; vitamin D 59.03 01-27-23: wbc 7.2; hgb 11.3; hct 36.1; mcv 91.6 plt 202  NO NEW LABS.    Review of Systems  Constitutional:  Negative for malaise/fatigue.  Respiratory:  Negative for cough and shortness of breath.   Cardiovascular:  Negative for chest pain, palpitations and leg swelling.  Gastrointestinal:  Negative for abdominal pain, constipation and heartburn.  Musculoskeletal:  Negative for back pain, joint pain and myalgias.   Skin: Negative.   Neurological:  Negative for dizziness.  Psychiatric/Behavioral:  The patient is not nervous/anxious.    Physical Exam Constitutional:      General: She is not in acute distress.    Appearance: She is well-developed. She is not diaphoretic.  Neck:     Thyroid: No thyromegaly.  Cardiovascular:     Rate and Rhythm: Normal rate and regular rhythm.     Pulses: Normal pulses.     Heart sounds: Normal heart sounds.  Pulmonary:     Effort: Pulmonary effort is normal. No respiratory distress.     Breath sounds: Normal breath sounds.  Abdominal:     General: Bowel sounds are normal. There is no distension.     Palpations: Abdomen is soft.     Tenderness: There is no abdominal tenderness.  Musculoskeletal:        General: Normal range of motion.     Cervical back: Neck supple.     Right lower leg: No edema.     Left lower leg: No edema.  Lymphadenopathy:     Cervical: No cervical adenopathy.  Skin:    General: Skin is warm and dry.  Neurological:     Mental Status: She is alert and oriented to person, place, and time.  Psychiatric:        Mood and Affect: Mood normal.      ASSESSMENT/ PLAN:  TODAY  Hypertension associated with stage 3 chronic kidney disease due to type 2 diabetes mellitus Chronic obstructive pulmonary disease unspecified COPD type Cervical myelopathy   Will lower reglan to daily  Will continue current plan of care Will continue to monitor her status.   Time spent with patient: 40 minutes: medications; dietary; activities.    Synthia Innocent NP [facility]  call [phone]

## 2023-05-18 ENCOUNTER — Non-Acute Institutional Stay (SKILLED_NURSING_FACILITY): Payer: Medicare Other | Admitting: Adult Health

## 2023-05-18 DIAGNOSIS — E069 Thyroiditis, unspecified: Secondary | ICD-10-CM

## 2023-05-18 DIAGNOSIS — R635 Abnormal weight gain: Secondary | ICD-10-CM

## 2023-05-18 DIAGNOSIS — K2950 Unspecified chronic gastritis without bleeding: Secondary | ICD-10-CM

## 2023-05-19 ENCOUNTER — Encounter: Payer: Self-pay | Admitting: Adult Health

## 2023-05-19 DIAGNOSIS — R635 Abnormal weight gain: Secondary | ICD-10-CM | POA: Insufficient documentation

## 2023-05-19 NOTE — Progress Notes (Signed)
Location:  [facility] Room Number: 150 Place of Service:  SNF (31)   CODE STATUS: dnr   Allergies  Allergen Reactions   Latex Rash   Levofloxacin Nausea And Vomiting   Neomycin-Bacitracin-Polymyxin  [Bacitracin-Neomycin-Polymyxin] Rash   Other Swelling and Rash    TOMATO  SOUP-PER MAR   Prednisone Hives, Itching, Rash and Other (See Comments)   Fish Allergy     rash   Shellfish Allergy Hives   Neosporin [Neomycin-Polymyxin-Gramicidin] Itching and Rash    Chief Complaint  Patient presents with   Acute Visit    Weight gain     HPI:  She has been gaining weight for the past 6 months to her current weight of 123 pounds. When she was first admitted to this facility she could not keep food down; could not maintain her weight; and she had substantial weakness. She is now eating without n/v. She is stronger as well. Her weight gain is beneficial.   Past Medical History:  Diagnosis Date   Allergy    Anemia    pernicious anemia   Arthritis    Asthma    Blood transfusion without reported diagnosis    CKD (chronic kidney disease)    COPD (chronic obstructive pulmonary disease) (HCC) 12/23/2019   Diabetic peripheral neuropathy associated with type 2 diabetes mellitus (HCC) 04/07/2021   DM (diabetes mellitus), type 2 with peripheral vascular complications (HCC) 12/23/2019   Gastritis    GERD (gastroesophageal reflux disease)    Glaucoma    History of blood in urine    Hyperlipidemia    Hypertension    Neuropathy    both feet   Ocular hypertension    Osteoporosis    Stroke (HCC)    Vitamin B12 deficiency anemia due to intrinsic factor deficiency     Past Surgical History:  Procedure Laterality Date   CATARACT EXTRACTION W/PHACO Right 11/27/2022   Procedure: CATARACT EXTRACTION PHACO AND INTRAOCULAR LENS PLACEMENT (IOC);  Surgeon: [name], MD;  Location: [facility];  Service: Ophthalmology;  Laterality: Right;  CDE: 15.67   CATARACT EXTRACTION  W/PHACO Left 12/18/2022   Procedure: CATARACT EXTRACTION PHACO AND INTRAOCULAR LENS PLACEMENT (IOC);  Surgeon: [name], MD;  Location: [facility];  Service: Ophthalmology;  Laterality: Left;  CDE: 11.78   CHOLECYSTECTOMY  2008   COLONOSCOPY  04/30/2011   Procedure: COLONOSCOPY;  Surgeon: Malissa Hippo, MD;  [facility];  Service: Endoscopy;  Laterality: N/A;   COLONOSCOPY  05/25/2012   Procedure: COLONOSCOPY;  Surgeon: Malissa Hippo, MD;  [facility];  Service: Endoscopy;  Laterality: N/A;  1:25-changed to 1200 Ann to notify pt   COLONOSCOPY Bilateral 12/2017   COLONOSCOPY WITH PROPOFOL N/A 11/29/2020   Procedure: COLONOSCOPY WITH PROPOFOL;  Surgeon: Dolores Frame, MD;  [facility];  Service: Gastroenterology;  Laterality: N/A;  1:35 [facility] is in the [facility]   ESOPHAGOGASTRODUODENOSCOPY N/A 12/07/2019   Procedure: ESOPHAGOGASTRODUODENOSCOPY (EGD);  Surgeon: Malissa Hippo, MD;  [facility];  Service: Endoscopy;  Laterality: N/A;  155   GALLBLADDER SURGERY     right breast cystectomy  1988   TUBAL LIGATION  1975    Social History   Socioeconomic History   Marital status: Divorced    Spouse name: Not on file   Number of children: 4   Years of education: 9   Highest education level: Not on file  Occupational History   Occupation: retired  Tobacco Use   Smoking status: Former    Current packs/day: 0.00    Average packs/day: 1 pack/day for 40.0 years (40.0 ttl pk-yrs)    Types: Cigarettes    Start date: 04/03/1971    Quit date: 04/03/2011    Years since quitting: 12.1   Smokeless tobacco: Never  Vaping Use   Vaping status: Never Used  Substance and Sexual Activity   Alcohol use: No   Drug use: No   Sexual activity: Not Currently  Other Topics Concern   Not on file  Social History Narrative   [birth year] lives at [facility], [place]   Social Determinants of Health   Financial Resource Strain: Not on  file  Food Insecurity: Not on file  Transportation Needs: Not on file  Physical Activity: Not on file  Stress: Not on file  Social Connections: Not on file  Intimate Partner Violence: Not on file   Family History  Problem Relation Age of Onset   Diabetes Mother    Hyperlipidemia Mother    Hypertension Mother    Heart disease Father    Hyperlipidemia Father    Diabetes Sister    Hyperlipidemia Sister    Hypertension Sister    Cancer Brother    Heart disease Brother    Hyperlipidemia Brother    Hypertension Brother    Diabetes Sister       VITAL SIGNS BP 128/70   Pulse (!) 56   Temp 98 F (36.7 C)   Resp 20   Ht 5\' 4"  (1.626 m)   Wt 123 lb (55.8 kg)   SpO2 96%   BMI 21.11 kg/m   Outpatient Encounter Medications as of 05/18/2023  Medication Sig   aspirin EC 81 MG tablet Take 81 mg by mouth daily. Swallow whole.   brimonidine-timolol (COMBIGAN) 0.2-0.5 % ophthalmic solution Place 1 drop into both eyes every 12 (twelve) hours.   Calcium Carb-Cholecalciferol (CALCIUM 500 + D) 500-5 MG-MCG TABS Take 1 tablet by mouth daily.   cyanocobalamin (V-R VITAMIN B-12) 500 MCG tablet Take 1 tablet (500 mcg total) by mouth every other day.   folic acid (V-R FOLIC ACID) 400 MCG tablet Take 1 tablet (400 mcg total) by mouth every other day.   gabapentin (NEURONTIN) 100 MG capsule Take 200 mg by mouth at bedtime. (2100)   gabapentin (NEURONTIN) 100 MG capsule Take 100 mg by mouth daily.   losartan (COZAAR) 25 MG tablet Take 25 mg by mouth daily.   metoCLOPramide (REGLAN) 5 MG tablet Take 5 mg by mouth daily at 2 PM.   NON FORMULARY Diet:Regular thin liquids  ALLERGIC TO FISH   omeprazole (PRILOSEC) 40 MG capsule Take 40 mg by mouth daily at 6 (six) AM. (0600)   rosuvastatin (CRESTOR) 20 MG tablet Take 20 mg by mouth daily.   senna-docusate (SENOKOT S) 8.6-50 MG tablet Take 1 tablet by mouth 2 (two) times daily.   sodium chloride 1 g tablet Take 1 g by mouth 2 (two) times daily with a  meal.   No facility-administered encounter medications on file as of 05/18/2023.     SIGNIFICANT DIAGNOSTIC EXAMS  05-24-20: dexa: t score -2.387  NO NEW EXAMS.     LABS REVIEWED PREVIOUS     06-04-22: wbc 5.5; hgb 9.9; hct 31.6; mcv 92.4 plt 161; glucose 84; bun 19; creat 1.32; k+ 4.5; na++ 136; ca 9.0; gfr 42; phos 4.2; albumin 3.6; iron 33; tibc 162; ferritin 392; urine ACR: 0.19 09-10-22: wbc  6.5; hgb 10.5; hct 33.0; mcv 93.0 plt 181; glucose 113; bun 19; creat 1.27; k+ 4.6; na++ 133; ca 9.2; gfr 43; protein 7.4 albumin 4.1; vitamin B 12: 1756; folate >40; iron 63 tibc 191 10-01-22: chol 137; ldl 80; trig 53; hdl 46 11-02-22: wbc 6.3; hgb 9.1; hct 28.8; mcv 94.1 plt 136; glucose 78; bun 19; creat 1.44; k+ 3.9; na++ 135; ca 8.7; gfr 37; protein 6.0 albumin 3.3 11-05-22: ACR 0.17 11-06-22: PTH 25 ca 9.4 11-11-22; folate 32.8 vitamin B12: 459; iron 30; tibc 172; ferritin 452; serotonin 78 12-31-22: wbc 5.4; hgb 10.9; hct 34.9; mcv 93.8 plt 167; hgb A1c 4.9; vitamin B12: 829; folate 39.3; vitamin D 59.03 01-27-23: wbc 7.2; hgb 11.3; hct 36.1; mcv 91.6 plt 202  NO NEW LABS.    Review of Systems  Constitutional:  Negative for malaise/fatigue.  Respiratory:  Negative for cough and shortness of breath.   Cardiovascular:  Negative for chest pain, palpitations and leg swelling.  Gastrointestinal:  Negative for abdominal pain, constipation and heartburn.  Musculoskeletal:  Negative for back pain, joint pain and myalgias.  Skin: Negative.   Neurological:  Negative for dizziness.  Psychiatric/Behavioral:  The patient is not nervous/anxious.    Physical Exam Constitutional:      General: She is not in acute distress.    Appearance: She is well-developed. She is not diaphoretic.  Neck:     Thyroid: No thyromegaly.  Cardiovascular:     Rate and Rhythm: Normal rate and regular rhythm.     Pulses: Normal pulses.     Heart sounds: Normal heart sounds.  Pulmonary:     Effort: Pulmonary effort  is normal. No respiratory distress.     Breath sounds: Normal breath sounds.  Abdominal:     General: Bowel sounds are normal. There is no distension.     Palpations: Abdomen is soft.     Tenderness: There is no abdominal tenderness.  Musculoskeletal:        General: Normal range of motion.     Cervical back: Neck supple.     Right lower leg: No edema.     Left lower leg: No edema.  Lymphadenopathy:     Cervical: No cervical adenopathy.  Skin:    General: Skin is warm and dry.  Neurological:     Mental Status: She is alert and oriented to person, place, and time.  Psychiatric:        Mood and Affect: Mood normal.      ASSESSMENT/ PLAN:  TODAY  Other chronic gastritis without bleeding Thyroiditis Weight gain  Will check tsh; at this time her weight is beneficial.    Synthia Innocent NP [facility]  call [phone]

## 2023-05-20 ENCOUNTER — Other Ambulatory Visit (HOSPITAL_COMMUNITY)
Admission: RE | Admit: 2023-05-20 | Discharge: 2023-05-20 | Disposition: A | Discharge status: Home / Self Care | Payer: Medicare Other | Source: Skilled Nursing Facility | Attending: Adult Health | Admitting: Adult Health

## 2023-05-20 DIAGNOSIS — E069 Thyroiditis, unspecified: Secondary | ICD-10-CM | POA: Insufficient documentation

## 2023-05-20 LAB — TSH: TSH: 2.925 u[IU]/mL (ref 0.350–4.500)

## 2023-05-21 ENCOUNTER — Inpatient Hospital Stay: Payer: Medicare Other | Attending: Hematology

## 2023-05-21 VITALS — BP 155/75 | HR 80 | Temp 98.7°F | Resp 18

## 2023-05-21 DIAGNOSIS — D631 Anemia in chronic kidney disease: Secondary | ICD-10-CM | POA: Diagnosis not present

## 2023-05-21 DIAGNOSIS — N1832 Chronic kidney disease, stage 3b: Secondary | ICD-10-CM | POA: Diagnosis not present

## 2023-05-21 DIAGNOSIS — E611 Iron deficiency: Secondary | ICD-10-CM | POA: Insufficient documentation

## 2023-05-21 DIAGNOSIS — E538 Deficiency of other specified B group vitamins: Secondary | ICD-10-CM

## 2023-05-21 LAB — CBC
HCT: 34 % — ABNORMAL LOW (ref 36.0–46.0)
Hemoglobin: 10.4 g/dL — ABNORMAL LOW (ref 12.0–15.0)
MCH: 29.1 pg (ref 26.0–34.0)
MCHC: 30.6 g/dL (ref 30.0–36.0)
MCV: 95.2 fL (ref 80.0–100.0)
Platelets: 149 10*3/uL — ABNORMAL LOW (ref 150–400)
RBC: 3.57 MIL/uL — ABNORMAL LOW (ref 3.87–5.11)
RDW: 14.7 % (ref 11.5–15.5)
WBC: 5.7 10*3/uL (ref 4.0–10.5)
nRBC: 0 % (ref 0.0–0.2)

## 2023-05-21 MED ORDER — EPOETIN ALFA-EPBX 10000 UNIT/ML IJ SOLN
10000.0000 [IU] | Freq: Once | INTRAMUSCULAR | Status: AC
Start: 2023-05-21 — End: 2023-05-21
  Administered 2023-05-21: 10000 [IU] via SUBCUTANEOUS
  Filled 2023-05-21: qty 1

## 2023-05-21 NOTE — Patient Instructions (Signed)
[facility] - A DEPT OF [facility]  Discharge Instructions: Thank you for choosing [facility] to provide your oncology and hematology care.  If you have a lab appointment with the Cancer Center - please note that after April 8th, 2024, all labs will be drawn in the cancer center.  You do not have to check in or register with the main entrance as you have in the past but will complete your check-in in the cancer center.  Wear comfortable clothing and clothing appropriate for easy access to any Portacath or PICC line.   We strive to give you quality time with your provider. You may need to reschedule your appointment if you arrive late (15 or more minutes).  Arriving late affects you and other patients whose appointments are after yours.  Also, if you miss three or more appointments without notifying the office, you may be dismissed from the clinic at the provider's discretion.      For prescription refill requests, have your pharmacy contact our office and allow 72 hours for refills to be completed.    Today you received Retacrit 10,000U     BELOW ARE SYMPTOMS THAT SHOULD BE REPORTED IMMEDIATELY: *FEVER GREATER THAN 100.4 F (38 C) OR HIGHER *CHILLS OR SWEATING *NAUSEA AND VOMITING THAT IS NOT CONTROLLED WITH YOUR NAUSEA MEDICATION *UNUSUAL SHORTNESS OF BREATH *UNUSUAL BRUISING OR BLEEDING *URINARY PROBLEMS (pain or burning when urinating, or frequent urination) *BOWEL PROBLEMS (unusual diarrhea, constipation, pain near the anus) TENDERNESS IN MOUTH AND THROAT WITH OR WITHOUT PRESENCE OF ULCERS (sore throat, sores in mouth, or a toothache) UNUSUAL RASH, SWELLING OR PAIN  UNUSUAL VAGINAL DISCHARGE OR ITCHING   Items with * indicate a potential emergency and should be followed up as soon as possible or go to the Emergency Department if any problems should occur.  Please show the CHEMOTHERAPY ALERT CARD or IMMUNOTHERAPY ALERT CARD at check-in  to the Emergency Department and triage nurse.  Should you have questions after your visit or need to cancel or reschedule your appointment, please contact [facility] - A DEPT OF [facility] [phone]  and follow the prompts.  Office hours are 8:00 a.m. to 4:30 p.m. Monday - Friday. Please note that voicemails left after 4:00 p.m. may not be returned until the following business day.  We are closed weekends and major holidays. You have access to a nurse at all times for urgent questions. Please call the main number to the clinic [phone] and follow the prompts.  For any non-urgent questions, you may also contact your provider using MyChart. We now offer e-Visits for anyone 67 and older to request care online for non-urgent symptoms. For details visit [email or web address].   Also download the MyChart app! Go to the app store, search "MyChart", open the app, select [place], and log in with your MyChart username and password.

## 2023-05-21 NOTE — Progress Notes (Signed)
Traci Parker presents today for injection per the provider's orders. Retacrit 10,000U administration without incident; injection site WNL; see MAR for injection details.  Patient tolerated procedure well and without incident.  No questions or complaints noted at this time.   Discharged from clinic via wheelchair in stable condition. Alert and oriented x 3. F/U with [facility] as scheduled.

## 2023-05-25 ENCOUNTER — Non-Acute Institutional Stay (SKILLED_NURSING_FACILITY): Payer: Medicare Other | Admitting: Adult Health

## 2023-05-25 ENCOUNTER — Encounter: Payer: Self-pay | Admitting: Adult Health

## 2023-05-25 DIAGNOSIS — Z0000 Encounter for general adult medical examination without abnormal findings: Secondary | ICD-10-CM

## 2023-05-25 NOTE — Patient Instructions (Signed)
   Traci Parker , Thank you for taking time to come for your Medicare Wellness Visit. I appreciate your ongoing commitment to your health goals. Please review the following plan we discussed and let me know if I can assist you in the future.   These are the goals we discussed:  Goals      Absence of Fall and Fall-Related Injury     Evidence-based guidance:  Assess fall risk using a validated tool when available. Consider balance and gait impairment, muscle weakness, diminished vision or hearing, environmental hazards, presence of urinary or bowel urgency and/or incontinence.  Communicate fall injury risk to interprofessional healthcare team.  Develop a fall prevention plan with the patient and family.  Promote use of personal vision and auditory aids.  Promote reorientation, appropriate sensory stimulation, and routines to decrease risk of fall when changes in mental status are present.  Assess assistance level required for safe and effective self-care; consider referral for home care.  Encourage physical activity, such as performance of self-care at highest level of ability, strength and balance exercise program, and provision of appropriate assistive devices; refer to rehabilitation therapy.  Refer to community-based fall prevention program where available.  If fall occurs, determine the cause and revise fall injury prevention plan.  Regularly review medication contribution to fall risk; consider risk related to polypharmacy and age.  Refer to pharmacist for consultation when concerns about medications are revealed.  Balance adequate pain management with potential for oversedation.  Provide guidance related to environmental modifications.  Consider supplementation with Vitamin D.   Notes:      Follow up with Primary Care Provider     General - Client will not be readmitted within 30 days ([facility])        This is a list of the screening recommended for you and due dates:  Health  Maintenance  Topic Date Due   Flu Shot  07/13/2023*   Eye exam for diabetics  08/14/2023*   Hemoglobin A1C  07/02/2023   Complete foot exam   02/18/2024   Yearly kidney health urinalysis for diabetes  02/23/2024   Yearly kidney function blood test for diabetes  03/23/2024   Medicare Annual Wellness Visit  05/24/2024   DTaP/Tdap/Td vaccine (2 - Td or Tdap) 05/23/2031   Pneumonia Vaccine  Completed   DEXA scan (bone density measurement)  Completed   Hepatitis C Screening  Completed   Zoster (Shingles) Vaccine  Completed   HPV Vaccine  Aged Out   Screening for Lung Cancer  Discontinued   Colon Cancer Screening  Discontinued   COVID-19 Vaccine  Discontinued  *Topic was postponed. The date shown is not the original due date.

## 2023-05-25 NOTE — Progress Notes (Signed)
Subjective:   Traci Parker is a 77 y.o. female who presents for Medicare Annual (Subsequent) preventive examination.  Visit Complete: In person  Patient Medicare AWV questionnaire was completed by the patient on 05-25-23; I have confirmed that all information answered by patient is correct and no changes since this date.  Cardiac Risk Factors include: advanced age (>52men, >63 women);sedentary lifestyle Review of Systems  Constitutional:  Negative for malaise/fatigue.  Respiratory:  Negative for cough and shortness of breath.   Cardiovascular:  Negative for chest pain, palpitations and leg swelling.  Gastrointestinal:  Negative for abdominal pain, constipation and heartburn.  Musculoskeletal:  Negative for back pain, joint pain and myalgias.  Skin: Negative.   Neurological:  Negative for dizziness.  Psychiatric/Behavioral:  The patient is not nervous/anxious.        Objective:    Today's Vitals   05/25/23 1503  BP: 118/64  Pulse: 68  Resp: 17  Temp: (!) 97.1 F (36.2 C)  SpO2: 100%  Weight: 123 lb (55.8 kg)  Height: 5\' 4"  (1.626 m)   Body mass index is 21.11 kg/m.     05/21/2023   10:32 AM 05/14/2023   11:44 AM 04/01/2023    4:11 PM 03/31/2023    9:45 AM 03/24/2023    2:52 PM 12/30/2022    8:20 AM 12/22/2022    4:00 PM  Advanced Directives  Does Patient Have a Medical Advance Directive? No Yes Yes Yes Yes Yes Yes  Type of Furniture conservator/restorer;Living will;Out of facility DNR (pink MOST or yellow form) Healthcare Power of [place];Living will;Out of facility DNR (pink MOST or yellow form) Healthcare Power of [place];Living will Living will;Healthcare Power of [employer] Power of [place];Living will Living will;Healthcare Power of Attorney  Does patient want to make changes to medical advance directive? No - Patient declined No - Patient declined No - Patient declined No - Patient declined No - Patient declined No - Patient declined No -  Patient declined  Copy of Healthcare Power of Attorney in Chart?  Yes - validated most recent copy scanned in chart (See row information) Yes - validated most recent copy scanned in chart (See row information)   No - copy requested   Would patient like information on creating a medical advance directive? No - Patient declined No - Patient declined No - Patient declined  No - Patient declined No - Patient declined No - Patient declined  Pre-existing out of facility DNR order (yellow form or pink MOST form)  Pink MOST form placed in chart (order not valid for inpatient use);Yellow form placed in chart (order not valid for inpatient use) Pink MOST form placed in chart (order not valid for inpatient use);Yellow form placed in chart (order not valid for inpatient use)        Current Medications (verified) Outpatient Encounter Medications as of 05/25/2023  Medication Sig   aspirin EC 81 MG tablet Take 81 mg by mouth daily. Swallow whole.   brimonidine-timolol (COMBIGAN) 0.2-0.5 % ophthalmic solution Place 1 drop into both eyes every 12 (twelve) hours.   Calcium Carb-Cholecalciferol (CALCIUM 500 + D) 500-5 MG-MCG TABS Take 1 tablet by mouth daily.   cyanocobalamin (V-R VITAMIN B-12) 500 MCG tablet Take 1 tablet (500 mcg total) by mouth every other day.   folic acid (V-R FOLIC ACID) 400 MCG tablet Take 1 tablet (400 mcg total) by mouth every other day.   gabapentin (NEURONTIN) 100 MG capsule Take 200 mg  by mouth at bedtime. (2100)   gabapentin (NEURONTIN) 100 MG capsule Take 100 mg by mouth daily.   losartan (COZAAR) 25 MG tablet Take 25 mg by mouth daily.   NON FORMULARY Diet:Regular thin liquids  ALLERGIC TO FISH   omeprazole (PRILOSEC) 40 MG capsule Take 40 mg by mouth daily at 6 (six) AM. (0600)   rosuvastatin (CRESTOR) 20 MG tablet Take 20 mg by mouth daily.   senna-docusate (SENOKOT S) 8.6-50 MG tablet Take 1 tablet by mouth 2 (two) times daily.   sodium chloride 1 g tablet Take 1 g by mouth 2  (two) times daily with a meal.   No facility-administered encounter medications on file as of 05/25/2023.    Allergies (verified) Latex, Levofloxacin, Neomycin-bacitracin-polymyxin  [bacitracin-neomycin-polymyxin], Other, Prednisone, Fish allergy, Shellfish allergy, and Neosporin [neomycin-polymyxin-gramicidin]   History: Past Medical History:  Diagnosis Date   Allergy    Anemia    pernicious anemia   Arthritis    Asthma    Blood transfusion without reported diagnosis    CKD (chronic kidney disease)    COPD (chronic obstructive pulmonary disease) (HCC) 12/23/2019   Diabetic peripheral neuropathy associated with type 2 diabetes mellitus (HCC) 04/07/2021   DM (diabetes mellitus), type 2 with peripheral vascular complications (HCC) 12/23/2019   Gastritis    GERD (gastroesophageal reflux disease)    Glaucoma    History of blood in urine    Hyperlipidemia    Hypertension    Neuropathy    both feet   Ocular hypertension    Osteoporosis    Stroke (HCC)    Vitamin B12 deficiency anemia due to intrinsic factor deficiency    Past Surgical History:  Procedure Laterality Date   CATARACT EXTRACTION W/PHACO Right 11/27/2022   Procedure: CATARACT EXTRACTION PHACO AND INTRAOCULAR LENS PLACEMENT (IOC);  Surgeon: [name], MD;  Location: [facility];  Service: Ophthalmology;  Laterality: Right;  CDE: 15.67   CATARACT EXTRACTION W/PHACO Left 12/18/2022   Procedure: CATARACT EXTRACTION PHACO AND INTRAOCULAR LENS PLACEMENT (IOC);  Surgeon: [name], MD;  Location: [facility];  Service: Ophthalmology;  Laterality: Left;  CDE: 11.78   CHOLECYSTECTOMY  2008   COLONOSCOPY  04/30/2011   Procedure: COLONOSCOPY;  Surgeon: Malissa Hippo, MD;  Location: [facility];  Service: Endoscopy;  Laterality: N/A;   COLONOSCOPY  05/25/2012   Procedure: COLONOSCOPY;  Surgeon: Malissa Hippo, MD;  Location: [facility];  Service: Endoscopy;  Laterality: N/A;  1:25-changed to 1200 Ann to notify pt    COLONOSCOPY Bilateral 12/2017   COLONOSCOPY WITH PROPOFOL N/A 11/29/2020   Procedure: COLONOSCOPY WITH PROPOFOL;  Surgeon: Dolores Frame, MD;  Location: [facility];  Service: Gastroenterology;  Laterality: N/A;  1:35 patient is in the [facility]   ESOPHAGOGASTRODUODENOSCOPY N/A 12/07/2019   Procedure: ESOPHAGOGASTRODUODENOSCOPY (EGD);  Surgeon: Malissa Hippo, MD;  Location: [facility];  Service: Endoscopy;  Laterality: N/A;  155   GALLBLADDER SURGERY     right breast cystectomy  1988   TUBAL LIGATION  1975   Family History  Problem Relation Age of Onset   Diabetes Mother    Hyperlipidemia Mother    Hypertension Mother    Heart disease Father    Hyperlipidemia Father    Diabetes Sister    Hyperlipidemia Sister    Hypertension Sister    Cancer Brother    Heart disease Brother    Hyperlipidemia Brother    Hypertension Brother    Diabetes Sister  Social History   Socioeconomic History   Marital status: Divorced    Spouse name: Not on file   Number of children: 4   Years of education: 9   Highest education level: Not on file  Occupational History   Occupation: retired   Tobacco Use   Smoking status: Former    Current packs/day: 0.00    Average packs/day: 1 pack/day for 40.0 years (40.0 ttl pk-yrs)    Types: Cigarettes    Start date: 04/03/1971    Quit date: 04/03/2011    Years since quitting: 12.1   Smokeless tobacco: Never  Vaping Use   Vaping status: Never Used  Substance and Sexual Activity   Alcohol use: No   Drug use: No   Sexual activity: Not Currently  Other Topics Concern   Not on file  Social History Narrative   01/17[birth year] lives at [facility], [place]   Social Determinants of Health   Financial Resource Strain: Not on file  Food Insecurity: Not on file  Transportation Needs: Not on file  Physical Activity: Not on file  Stress: Not on file  Social Connections: Not on file    Tobacco Counseling Counseling given: Not  Answered   Clinical Intake:  Pre-visit preparation completed: Yes  Pain : No/denies pain     BMI - recorded: 21.11 Nutritional Status: BMI of 19-24  Normal Nutritional Risks: Unintentional weight loss Diabetes: No  How often do you need to have someone help you when you read instructions, pamphlets, or other written materials from your doctor or pharmacy?: 5 - Always  Interpreter Needed?: No      Activities of Daily Living    05/25/2023    3:07 PM  In your present state of health, do you have any difficulty performing the following activities:  Hearing? 0  Difficulty concentrating or making decisions? 1  Walking or climbing stairs? 1  Dressing or bathing? 1  Doing errands, shopping? 1  Preparing Food and eating ? Y  Using the Toilet? Y  In the past six months, have you accidently leaked urine? Y  Do you have problems with loss of bowel control? Y  Managing your Medications? Y  Managing your Finances? Y  Housekeeping or managing your Housekeeping? Y    Patient Care Team: Sharee Holster, NP as PCP - General (Geriatric Medicine) Doreatha Massed, MD as Consulting Physician (Hematology) [facility])  Indicate any recent Medical Services you may have received from other than [facility] providers in the past year (date may be approximate).     Assessment:   This is a routine wellness examination for [place].  Hearing/Vision screen No results found.   Goals Addressed             This Visit's Progress    Absence of Fall and Fall-Related Injury   On track    Evidence-based guidance:  Assess fall risk using a validated tool when available. Consider balance and gait impairment, muscle weakness, diminished vision or hearing, environmental hazards, presence of urinary or bowel urgency and/or incontinence.  Communicate fall injury risk to interprofessional healthcare team.  Develop a fall prevention plan with the patient and family.   Promote use of personal vision and auditory aids.  Promote reorientation, appropriate sensory stimulation, and routines to decrease risk of fall when changes in mental status are present.  Assess assistance level required for safe and effective self-care; consider referral for home care.  Encourage physical activity, such as  performance of self-care at highest level of ability, strength and balance exercise program, and provision of appropriate assistive devices; refer to rehabilitation therapy.  Refer to community-based fall prevention program where available.  If fall occurs, determine the cause and revise fall injury prevention plan.  Regularly review medication contribution to fall risk; consider risk related to polypharmacy and age.  Refer to pharmacist for consultation when concerns about medications are revealed.  Balance adequate pain management with potential for oversedation.  Provide guidance related to environmental modifications.  Consider supplementation with Vitamin D.   Notes:      Follow up with Primary Care Provider   On track    General - Client will not be readmitted within 30 days ([facility])   On track      Depression Screen    05/25/2023    3:08 PM 09/28/2022   10:20 AM 05/13/2022    3:10 PM 10/10/2021    3:13 PM 08/04/2021    1:13 PM 05/12/2021   11:47 AM 05/10/2020    2:25 PM  PHQ 2/9 Scores  PHQ - 2 Score 0 0 0 0 0 0 0  PHQ- 9 Score   0 0 0      Fall Risk    05/25/2023    3:07 PM 02/18/2023    1:11 PM 09/28/2022   10:20 AM 05/13/2022    3:10 PM 10/10/2021    3:13 PM  Fall Risk   Falls in the past year? 0 0 0 0 0  Number falls in past yr: 0 0 0 0 0  Injury with Fall? 0 0 0 0 0  Risk for fall due to : Impaired balance/gait;Impaired mobility Impaired balance/gait;Impaired mobility No Fall Risks Impaired balance/gait;Impaired mobility Impaired balance/gait;Impaired mobility  Follow up Falls evaluation completed  Falls evaluation completed      MEDICARE RISK  AT HOME: Medicare Risk at Home Any stairs in or around the home?: Yes If so, are there any without handrails?: No Home free of loose throw rugs in walkways, pet beds, electrical cords, etc?: Yes Adequate lighting in your home to reduce risk of falls?: Yes Life alert?: No Use of a cane, walker or w/c?: Yes Grab bars in the bathroom?: Yes Shower chair or bench in shower?: Yes Elevated toilet seat or a handicapped toilet?: Yes  TIMED UP AND GO:  Was the test performed?  No    Cognitive Function:    05/25/2023    3:08 PM 05/13/2022    3:11 PM 05/12/2021   11:48 AM  MMSE - Mini Mental State Exam  Orientation to time 4 5 5   Orientation to Place 4 5 5   Registration 3 3 3   Attention/ Calculation 5 5 4   Recall 2 3 3   Language- name 2 objects 2 2 2   Language- repeat 1 1 1   Language- follow 3 step command 3 3 3   Language- read & follow direction 1 1 1   Write a sentence 1 1 1   Copy design 1 1 1   Total score 27 30 29         05/25/2023    3:09 PM 05/13/2022    3:11 PM 05/12/2021   11:48 AM 05/10/2020    2:26 PM  6CIT Screen  What Year? 0 points 0 points 0 points 0 points  What month? 0 points 0 points 0 points 0 points  What time? 0 points 0 points 0 points 0 points  Count back from 20 2 points 0 points 0 points  0 points  Months in reverse 2 points 0 points 0 points 0 points  Repeat phrase 4 points 0 points 0 points 0 points  Total Score 8 points 0 points 0 points 0 points    Immunizations Immunization History  Administered Date(s) Administered   Influenza, High Dose Seasonal PF 04/17/2019   Influenza-Unspecified 04/01/2012, 04/05/2018, 04/19/2020, 04/16/2021, 04/14/2022   Moderna Sars-Covid-2 Vaccination 05/16/2020   PFIZER(Purple Top)SARS-COV-2 Vaccination 09/25/2019, 10/16/2019, 10/23/2020   PNEUMOCOCCAL CONJUGATE-20 02/17/2022   Pfizer Covid-19 Vaccine Bivalent Booster 2yrs & up 05/06/2021, 12/02/2021, 05/06/2022   Pneumococcal Conjugate-13 03/21/2014    Pneumococcal Polysaccharide-23 09/24/2017   Pneumococcal-Unspecified 12/02/2008   Rsv, Bivalent, Protein Subunit Rsvpref,pf Verdis Frederickson) 06/15/2022   Tdap 05/22/2021   Zoster Recombinant(Shingrix) 03/11/2021, 05/06/2021, 06/13/2021   Zoster, Unspecified 03/11/2021    TDAP status: Up to date  Flu Vaccine status: Up to date  Pneumococcal vaccine status: Up to date  Covid-19 vaccine status: Completed vaccines  Qualifies for Shingles Vaccine? No   Zostavax completed Yes   Shingrix Completed?: No.    Education has been provided regarding the importance of this vaccine. Patient has been advised to call insurance company to determine out of pocket expense if they have not yet received this vaccine. Advised may also receive vaccine at local pharmacy or Health Dept. Verbalized acceptance and understanding.  Screening Tests Health Maintenance  Topic Date Due   INFLUENZA VACCINE  07/13/2023 (Originally 02/11/2023)   OPHTHALMOLOGY EXAM  08/14/2023 (Originally 12/18/2022)   HEMOGLOBIN A1C  07/02/2023   FOOT EXAM  02/18/2024   Diabetic kidney evaluation - Urine ACR  02/23/2024   Diabetic kidney evaluation - eGFR measurement  03/23/2024   Medicare Annual Wellness (AWV)  05/24/2024   DTaP/Tdap/Td (2 - Td or Tdap) 05/23/2031   Pneumonia Vaccine 23+ Years old  Completed   DEXA SCAN  Completed   Hepatitis C Screening  Completed   Zoster Vaccines- Shingrix  Completed   HPV VACCINES  Aged Out   Lung Cancer Screening  Discontinued   Colonoscopy  Discontinued   COVID-19 Vaccine  Discontinued    Health Maintenance  There are no preventive care reminders to display for this patient.   Colorectal cancer screening: No longer required.   Mammogram status: No longer required due to declines .  Bone Density status: Completed 02-25-22. Results reflect: Bone density results: OSTEOPOROSIS. Repeat every   years.  Lung Cancer Screening: (Low Dose CT Chest recommended if Age 58-80 years, 20 pack-year  currently smoking OR have quit w/in 15years.) does not qualify.   Lung Cancer Screening Referral:   Additional Screening:  Hepatitis C Screening: does not qualify; Completed   Vision Screening: Recommended annual ophthalmology exams for early detection of glaucoma and other disorders of the eye. Is the patient up to date with their annual eye exam?  No  Who is the provider or what is the name of the office in which the patient attends annual eye exams?  If pt is not established with a provider, would they like to be referred to a provider to establish care? No .   Dental Screening: Recommended annual dental exams for proper oral hygiene  Diabetic Foot Exam:   Community Resource Referral / Chronic Care Management: CRR required this visit?  No   CCM required this visit?  No     Plan:     I have personally reviewed and noted the following in the patient's chart:   Medical and social history Use of alcohol, tobacco or  illicit drugs  Current medications and supplements including opioid prescriptions. Patient is not currently taking opioid prescriptions. Functional ability and status Nutritional status Physical activity Advanced directives List of other physicians Hospitalizations, surgeries, and ER visits in previous 12 months Vitals Screenings to include cognitive, depression, and falls Referrals and appointments  In addition, I have reviewed and discussed with patient certain preventive protocols, quality metrics, and best practice recommendations. A written personalized care plan for preventive services as well as general preventive health recommendations were provided to patient.     Sharee Holster, NP   05/25/2023   After Visit Summary: (In Person-Declined) Patient declined AVS at this time.  Nurse Notes: this exam was performed by myself at this facility

## 2023-05-26 ENCOUNTER — Encounter: Payer: Self-pay | Admitting: Adult Health

## 2023-05-26 ENCOUNTER — Non-Acute Institutional Stay (SKILLED_NURSING_FACILITY): Payer: Medicare Other | Admitting: Adult Health

## 2023-05-26 DIAGNOSIS — E538 Deficiency of other specified B group vitamins: Secondary | ICD-10-CM | POA: Diagnosis not present

## 2023-05-26 DIAGNOSIS — H40053 Ocular hypertension, bilateral: Secondary | ICD-10-CM | POA: Diagnosis not present

## 2023-05-26 DIAGNOSIS — K2950 Unspecified chronic gastritis without bleeding: Secondary | ICD-10-CM

## 2023-05-26 NOTE — Progress Notes (Signed)
Location:  [facility] Room Number: 150 Place of Service:  SNF (31)   CODE STATUS: dnr   Allergies  Allergen Reactions   Latex Rash   Levofloxacin Nausea And Vomiting   Neomycin-Bacitracin-Polymyxin  [Bacitracin-Neomycin-Polymyxin] Rash   Other Swelling and Rash    TOMATO  SOUP-PER MAR   Prednisone Hives, Itching, Rash and Other (See Comments)   Fish Allergy     rash   Shellfish Allergy Hives   Neosporin [Neomycin-Polymyxin-Gramicidin] Itching and Rash    Chief Complaint  Patient presents with   Medical Management of Chronic Issues        Other chronic gastritis without hemorrhage:  Increased intraocular pressure bilateral: Vitamin B 12 deficiency     HPI:  She is a 77 year old long term resident of this facility being seen for the management of her chronic illnesses:  Other chronic gastritis without hemorrhage:  Increased intraocular pressure bilateral: Vitamin B 12 deficiency.  There are no reports of uncontrolled pain. She has a good weight. She continues to get out of bed daily.   Past Medical History:  Diagnosis Date   Allergy    Anemia    pernicious anemia   Arthritis    Asthma    Blood transfusion without reported diagnosis    CKD (chronic kidney disease)    COPD (chronic obstructive pulmonary disease) (HCC) 12/23/2019   Diabetic peripheral neuropathy associated with type 2 diabetes mellitus (HCC) 04/07/2021   DM (diabetes mellitus), type 2 with peripheral vascular complications (HCC) 12/23/2019   Gastritis    GERD (gastroesophageal reflux disease)    Glaucoma    History of blood in urine    Hyperlipidemia    Hypertension    Neuropathy    both feet   Ocular hypertension    Osteoporosis    Stroke (HCC)    Vitamin B12 deficiency anemia due to intrinsic factor deficiency     Past Surgical History:  Procedure Laterality Date   CATARACT EXTRACTION W/PHACO Right 11/27/2022   Procedure: CATARACT EXTRACTION PHACO AND INTRAOCULAR  LENS PLACEMENT (IOC);  Surgeon: [name], MD;  Location: [facility];  Service: Ophthalmology;  Laterality: Right;  CDE: 15.67   CATARACT EXTRACTION W/PHACO Left 12/18/2022   Procedure: CATARACT EXTRACTION PHACO AND INTRAOCULAR LENS PLACEMENT (IOC);  Surgeon: [name], MD;  Location: [facility];  Service: Ophthalmology;  Laterality: Left;  CDE: 11.78   CHOLECYSTECTOMY  2008   COLONOSCOPY  04/30/2011   Procedure: COLONOSCOPY;  Surgeon: Malissa Hippo, MD;  [facility]: [facility];  Service: Endoscopy;  Laterality: N/A;   COLONOSCOPY  05/25/2012   Procedure: COLONOSCOPY;  Surgeon: Malissa Hippo, MD;  [facility];  Service: Endoscopy;  Laterality: N/A;  1:25-changed to 1200 Ann to notify pt   COLONOSCOPY Bilateral 12/2017   COLONOSCOPY WITH PROPOFOL N/A 11/29/2020   Procedure: COLONOSCOPY WITH PROPOFOL;  Surgeon: Dolores Frame, MD;  [facility]: [facility];  Service: Gastroenterology;  Laterality: N/A;  1:35 [facility] is in the [facility]   ESOPHAGOGASTRODUODENOSCOPY N/A 12/07/2019   Procedure: ESOPHAGOGASTRODUODENOSCOPY (EGD);  Surgeon: Malissa Hippo, MD;  [facility]: [facility];  Service: Endoscopy;  Laterality: N/A;  155   GALLBLADDER SURGERY     right breast cystectomy  1988   TUBAL LIGATION  1975    Social History   Socioeconomic History   Marital status: Divorced    Spouse name: Not on file   Number of children: 4  Years of education: 9   Highest education level: Not on file  Occupational History   Occupation: retired   Tobacco Use   Smoking status: Former    Current packs/day: 0.00    Average packs/day: 1 pack/day for 40.0 years (40.0 ttl pk-yrs)    Types: Cigarettes    Start date: 04/03/1971    Quit date: 04/03/2011    Years since quitting: 12.1   Smokeless tobacco: Never  Vaping Use   Vaping status: Never Used  Substance and Sexual Activity   Alcohol use: No   Drug use: No   Sexual activity: Not Currently  Other Topics Concern   Not  on file  Social History Narrative   01/17/20 lives at [facility], [place]   Social Determinants of Health   Financial Resource Strain: Not on file  Food Insecurity: Not on file  Transportation Needs: Not on file  Physical Activity: Not on file  Stress: Not on file  Social Connections: Not on file  Intimate Partner Violence: Not on file   Family History  Problem Relation Age of Onset   Diabetes Mother    Hyperlipidemia Mother    Hypertension Mother    Heart disease Father    Hyperlipidemia Father    Diabetes Sister    Hyperlipidemia Sister    Hypertension Sister    Cancer Brother    Heart disease Brother    Hyperlipidemia Brother    Hypertension Brother    Diabetes Sister       VITAL SIGNS BP 120/72   Pulse 70   Temp 97.6 F (36.4 C)   Resp 18   Ht 5\' 4"  (1.626 m)   Wt 123 lb (55.8 kg)   SpO2 100%   BMI 21.11 kg/m   Outpatient Encounter Medications as of 05/26/2023  Medication Sig   aspirin EC 81 MG tablet Take 81 mg by mouth daily. Swallow whole.   brimonidine-timolol (COMBIGAN) 0.2-0.5 % ophthalmic solution Place 1 drop into both eyes every 12 (twelve) hours.   Calcium Carb-Cholecalciferol (CALCIUM 500 + D) 500-5 MG-MCG TABS Take 1 tablet by mouth daily.   cyanocobalamin (V-R VITAMIN B-12) 500 MCG tablet Take 1 tablet (500 mcg total) by mouth every other day.   folic acid (V-R FOLIC ACID) 400 MCG tablet Take 1 tablet (400 mcg total) by mouth every other day.   gabapentin (NEURONTIN) 100 MG capsule Take 200 mg by mouth at bedtime. (2100)   gabapentin (NEURONTIN) 100 MG capsule Take 100 mg by mouth daily.   losartan (COZAAR) 25 MG tablet Take 25 mg by mouth daily.   NON FORMULARY Diet:Regular thin liquids  ALLERGIC TO FISH   omeprazole (PRILOSEC) 40 MG capsule Take 40 mg by mouth daily at 6 (six) AM. (0600)   rosuvastatin (CRESTOR) 20 MG tablet Take 20 mg by mouth daily.   senna-docusate (SENOKOT S) 8.6-50 MG tablet Take 1 tablet by mouth 2 (two) times  daily.   sodium chloride 1 g tablet Take 1 g by mouth 2 (two) times daily with a meal.   No facility-administered encounter medications on file as of 05/26/2023.     SIGNIFICANT DIAGNOSTIC EXAMS   05-24-20: dexa: t score -2.387  NO NEW EXAMS.     LABS REVIEWED PREVIOUS     06-04-22: wbc 5.5; hgb 9.9; hct 31.6; mcv 92.4 plt 161; glucose 84; bun 19; creat 1.32; k+ 4.5; na++ 136; ca 9.0; gfr 42; phos 4.2; albumin 3.6; iron 33; tibc 162; ferritin 392;  urine ACR: 0.19 09-10-22: wbc 6.5; hgb 10.5; hct 33.0; mcv 93.0 plt 181; glucose 113; bun 19; creat 1.27; k+ 4.6; na++ 133; ca 9.2; gfr 43; protein 7.4 albumin 4.1; vitamin B 12: 1756; folate >40; iron 63 tibc 191 10-01-22: chol 137; ldl 80; trig 53; hdl 46 11-02-22: wbc 6.3; hgb 9.1; hct 28.8; mcv 94.1 plt 136; glucose 78; bun 19; creat 1.44; k+ 3.9; na++ 135; ca 8.7; gfr 37; protein 6.0 albumin 3.3 11-05-22: ACR 0.17 11-06-22: PTH 25 ca 9.4 11-11-22; folate 32.8 vitamin B12: 459; iron 30; tibc 172; ferritin 452; serotonin 78 12-31-22: wbc 5.4; hgb 10.9; hct 34.9; mcv 93.8 plt 167; hgb A1c 4.9; vitamin B12: 829; folate 39.3; vitamin D 59.03 01-27-23: wbc 7.2; hgb 11.3; hct 36.1; mcv 91.6 plt 202  TODAY  03-24-23: wbc 6.2; hgb 9.8; hct 31.5; mcv 93.5; plt 235; glucose 131; bun 18; creat 1.44; k+ 4.7; na++ 134; ca 8.4; gfr 37; protein 6.9; albumin 3.7  vitamin B12: 1068; iron 33; tibc 173 04-23-23: wbc 7.4; hgb 10.4; hct 34.0; mcv 95.8 plt 143 05-20-23: tsh 2.925 05-21-23: wbc 5.7; hgb 10.4; hct 34.0; mcv 95.2 plt 149     Review of Systems  Constitutional:  Negative for malaise/fatigue.  Respiratory:  Negative for cough and shortness of breath.   Cardiovascular:  Negative for chest pain, palpitations and leg swelling.  Gastrointestinal:  Negative for abdominal pain, constipation and heartburn.  Musculoskeletal:  Negative for back pain, joint pain and myalgias.  Skin: Negative.   Neurological:  Negative for dizziness.  Psychiatric/Behavioral:   The patient is not nervous/anxious.    Physical Exam Constitutional:      General: She is not in acute distress.    Appearance: She is well-developed. She is not diaphoretic.  Neck:     Thyroid: No thyromegaly.  Cardiovascular:     Rate and Rhythm: Normal rate and regular rhythm.     Pulses: Normal pulses.     Heart sounds: Normal heart sounds.  Pulmonary:     Effort: Pulmonary effort is normal. No respiratory distress.     Breath sounds: Normal breath sounds.  Abdominal:     General: Bowel sounds are normal. There is no distension.     Palpations: Abdomen is soft.     Tenderness: There is no abdominal tenderness.  Musculoskeletal:        General: Normal range of motion.     Cervical back: Neck supple.     Right lower leg: No edema.     Left lower leg: No edema.  Lymphadenopathy:     Cervical: No cervical adenopathy.  Skin:    General: Skin is warm and dry.  Neurological:     Mental Status: She is alert and oriented to person, place, and time.  Psychiatric:        Mood and Affect: Mood normal.     ASSESSMENT/ PLAN:  TODAY   Other chronic gastritis without hemorrhage: will continue prilosec 40 mg daily   2. Increased intraocular pressure bilateral: will continue truspot to both eyes twice daily   3. Vitamin B 12 deficiency: level 1068; folate 32.8    PREVIOUS    4. Vitamin D deficiency: level 41.14 will continue 1000 units daily   5. Thyroiditis: tsh 2.925 will monitor  6. Cervical myelopathy: is without change  7. Failure to thrive in adult: weight is 123 pounds; will continue to monitor   8. Aortic atherosclerosis: (ct 12-23-19) will monitor  9. 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 Chronic obstructive pulmonary disease unspecified COPD type: will monitor   11. Hypertension associated with stage 3 chronic kidney disease due to type 2 diabetes mellitus: b/p 120/72 will continue cozaar 25  mg daily   12. CKD stage 3 due to type 2 diabetes mellitus: bun 18; creat 1.44 gfr 37  13. Diabetic peripheral neuropathy associated with type 2 diabetes mellitus: is on gabapentin 100 mg in the AM and 200 mg at hs   14. Type 2 diabetes mellitus with stage 3 chronic kidney disease without long term current use of insulin: hgb A1c 5.3; is on asa 81 mg daily statin and arb   15. Hyperlipidemia associated with type 2 diabetes mellitus: ldl 63 is on crestor 10 mg daily   16. Anemia due to chronic renal failure with erythropoietin stage 3: hgb 10.4; will continue iron daily is followed by oncology     Synthia Innocent NP [facility]  call [phone]

## 2023-05-31 ENCOUNTER — Encounter: Payer: Self-pay | Admitting: Adult Health

## 2023-05-31 ENCOUNTER — Non-Acute Institutional Stay (SKILLED_NURSING_FACILITY): Payer: Medicare Other | Admitting: Adult Health

## 2023-05-31 DIAGNOSIS — J309 Allergic rhinitis, unspecified: Secondary | ICD-10-CM | POA: Diagnosis not present

## 2023-05-31 NOTE — Progress Notes (Signed)
Location:  [facility] Room Number: 150 Place of Service:  SNF (31)   CODE STATUS: dnr   Allergies  Allergen Reactions   Latex Rash   Levofloxacin Nausea And Vomiting   Neomycin-Bacitracin-Polymyxin  [Bacitracin-Neomycin-Polymyxin] Rash   Other Swelling and Rash    TOMATO  SOUP-PER MAR   Prednisone Hives, Itching, Rash and Other (See Comments)   Fish Allergy     rash   Shellfish Allergy Hives   Neosporin [Neomycin-Polymyxin-Gramicidin] Itching and Rash    Chief Complaint  Patient presents with   Acute Visit    Sinus congestion     HPI:  She has been having sinus congestion since yesterday. She is having a cough with clear sputum. There is a sore throat. There are no reports of fevers; no chest pain; no shortness of breath.   Past Medical History:  Diagnosis Date   Allergy    Anemia    pernicious anemia   Arthritis    Asthma    Blood transfusion without reported diagnosis    CKD (chronic kidney disease)    COPD (chronic obstructive pulmonary disease) (HCC) 12/23/2019   Diabetic peripheral neuropathy associated with type 2 diabetes mellitus (HCC) 04/07/2021   DM (diabetes mellitus), type 2 with peripheral vascular complications (HCC) 12/23/2019   Gastritis    GERD (gastroesophageal reflux disease)    Glaucoma    History of blood in urine    Hyperlipidemia    Hypertension    Neuropathy    both feet   Ocular hypertension    Osteoporosis    Stroke (HCC)    Vitamin B12 deficiency anemia due to intrinsic factor deficiency     Past Surgical History:  Procedure Laterality Date   CATARACT EXTRACTION W/PHACO Right 11/27/2022   Procedure: CATARACT EXTRACTION PHACO AND INTRAOCULAR LENS PLACEMENT (IOC);  Surgeon: [name], MD;  Location: [facility];  Service: Ophthalmology;  Laterality: Right;  CDE: 15.67   CATARACT EXTRACTION W/PHACO Left 12/18/2022   Procedure: CATARACT EXTRACTION PHACO AND INTRAOCULAR LENS PLACEMENT (IOC);  Surgeon: [name], MD;  Location: [facility];  Service: Ophthalmology;  Laterality: Left;  CDE: 11.78   CHOLECYSTECTOMY  2008   COLONOSCOPY  04/30/2011   Procedure: COLONOSCOPY;  Surgeon: Malissa Hippo, MD;  [facility];  Service: Endoscopy;  Laterality: N/A;   COLONOSCOPY  05/25/2012   Procedure: COLONOSCOPY;  Surgeon: Malissa Hippo, MD;  [facility];  Service: Endoscopy;  Laterality: N/A;  1:25-changed to 1200 Ann to notify pt   COLONOSCOPY Bilateral 12/2017   COLONOSCOPY WITH PROPOFOL N/A 11/29/2020   Procedure: COLONOSCOPY WITH PROPOFOL;  Surgeon: Dolores Frame, MD;  [facility];  Service: Gastroenterology;  Laterality: N/A;  1:35 [facility] is in the [facility]   ESOPHAGOGASTRODUODENOSCOPY N/A 12/07/2019   Procedure: ESOPHAGOGASTRODUODENOSCOPY (EGD);  Surgeon: Malissa Hippo, MD;  [facility]: [facility];  Service: Endoscopy;  Laterality: N/A;  155   GALLBLADDER SURGERY     right breast cystectomy  1988   TUBAL LIGATION  1975    Social History   Socioeconomic History   Marital status: Divorced    Spouse name: Not on file   Number of children: 4   Years of education: 9   Highest education level: Not on file  Occupational History   Occupation: retired   Tobacco Use   Smoking status: Former    Current packs/day: 0.00    Average packs/day: 1 pack/day for  40.0 years (40.0 ttl pk-yrs)    Types: Cigarettes    Start date: 04/03/1971    Quit date: 04/03/2011    Years since quitting: 12.1   Smokeless tobacco: Never  Vaping Use   Vaping status: Never Used  Substance and Sexual Activity   Alcohol use: No   Drug use: No   Sexual activity: Not Currently  Other Topics Concern   Not on file  Social History Narrative   01/17/20 lives at [facility], [place]   Social Determinants of Health   Financial Resource Strain: Not on file  Food Insecurity: Not on file  Transportation Needs: Not on file  Physical Activity: Not on file  Stress: Not on  file  Social Connections: Not on file  Intimate Partner Violence: Not on file   Family History  Problem Relation Age of Onset   Diabetes Mother    Hyperlipidemia Mother    Hypertension Mother    Heart disease Father    Hyperlipidemia Father    Diabetes Sister    Hyperlipidemia Sister    Hypertension Sister    Cancer Brother    Heart disease Brother    Hyperlipidemia Brother    Hypertension Brother    Diabetes Sister       VITAL SIGNS BP 116/66   Pulse 88   Temp (!) 97.3 F (36.3 C)   Resp 20   Ht 5\' 4"  (1.626 m)   Wt 123 lb (55.8 kg)   SpO2 96%   BMI 21.11 kg/m   Outpatient Encounter Medications as of 05/31/2023  Medication Sig   aspirin EC 81 MG tablet Take 81 mg by mouth daily. Swallow whole.   brimonidine-timolol (COMBIGAN) 0.2-0.5 % ophthalmic solution Place 1 drop into both eyes every 12 (twelve) hours.   Calcium Carb-Cholecalciferol (CALCIUM 500 + D) 500-5 MG-MCG TABS Take 1 tablet by mouth daily.   cyanocobalamin (V-R VITAMIN B-12) 500 MCG tablet Take 1 tablet (500 mcg total) by mouth every other day.   folic acid (V-R FOLIC ACID) 400 MCG tablet Take 1 tablet (400 mcg total) by mouth every other day.   gabapentin (NEURONTIN) 100 MG capsule Take 200 mg by mouth at bedtime. (2100)   gabapentin (NEURONTIN) 100 MG capsule Take 100 mg by mouth daily.   losartan (COZAAR) 25 MG tablet Take 25 mg by mouth daily.   NON FORMULARY Diet:Regular thin liquids  ALLERGIC TO FISH   omeprazole (PRILOSEC) 40 MG capsule Take 40 mg by mouth daily at 6 (six) AM. (0600)   rosuvastatin (CRESTOR) 20 MG tablet Take 20 mg by mouth daily.   senna-docusate (SENOKOT S) 8.6-50 MG tablet Take 1 tablet by mouth 2 (two) times daily.   sodium chloride 1 g tablet Take 1 g by mouth 2 (two) times daily with a meal.   No facility-administered encounter medications on file as of 05/31/2023.     SIGNIFICANT DIAGNOSTIC EXAMS  05-24-20: dexa: t score -2.387  NO NEW EXAMS.     LABS REVIEWED  PREVIOUS     06-04-22: wbc 5.5; hgb 9.9; hct 31.6; mcv 92.4 plt 161; glucose 84; bun 19; creat 1.32; k+ 4.5; na++ 136; ca 9.0; gfr 42; phos 4.2; albumin 3.6; iron 33; tibc 162; ferritin 392; urine ACR: 0.19 09-10-22: wbc 6.5; hgb 10.5; hct 33.0; mcv 93.0 plt 181; glucose 113; bun 19; creat 1.27; k+ 4.6; na++ 133; ca 9.2; gfr 43; protein 7.4 albumin 4.1; vitamin B 12: 1756; folate >40; iron 63 tibc 191  10-01-22: chol 137; ldl 80; trig 53; hdl 46 11-02-22: wbc 6.3; hgb 9.1; hct 28.8; mcv 94.1 plt 136; glucose 78; bun 19; creat 1.44; k+ 3.9; na++ 135; ca 8.7; gfr 37; protein 6.0 albumin 3.3 11-05-22: ACR 0.17 11-06-22: PTH 25 ca 9.4 11-11-22; folate 32.8 vitamin B12: 459; iron 30; tibc 172; ferritin 452; serotonin 78 12-31-22: wbc 5.4; hgb 10.9; hct 34.9; mcv 93.8 plt 167; hgb A1c 4.9; vitamin B12: 829; folate 39.3; vitamin D 59.03 01-27-23: wbc 7.2; hgb 11.3; hct 36.1; mcv 91.6 plt 202 03-24-23: wbc 6.2; hgb 9.8; hct 31.5; mcv 93.5; plt 235; glucose 131; bun 18; creat 1.44; k+ 4.7; na++ 134; ca 8.4; gfr 37; protein 6.9; albumin 3.7  vitamin B12: 1068; iron 33; tibc 173 04-23-23: wbc 7.4; hgb 10.4; hct 34.0; mcv 95.8 plt 143 05-20-23: tsh 2.925 05-21-23: wbc 5.7; hgb 10.4; hct 34.0; mcv 95.2 plt 149  NO NEW LABS.   Review of Systems  Constitutional:  Negative for malaise/fatigue.  HENT:  Positive for congestion and sore throat. Negative for ear pain and sinus pain.   Eyes:  Negative for pain and discharge.  Respiratory:  Positive for cough and sputum production. Negative for shortness of breath.   Cardiovascular:  Negative for chest pain, palpitations and leg swelling.  Gastrointestinal:  Negative for abdominal pain, constipation and heartburn.  Musculoskeletal:  Negative for back pain, joint pain and myalgias.  Skin: Negative.   Neurological:  Negative for dizziness.  Psychiatric/Behavioral:  The patient is not nervous/anxious.     Physical Exam Constitutional:      General: She is not in acute  distress.    Appearance: She is well-developed. She is not diaphoretic.  HENT:     Right Ear: Tympanic membrane normal.     Left Ear: Tympanic membrane normal.     Nose: Nose normal.     Mouth/Throat:     Mouth: Mucous membranes are moist.     Comments: Erythema present  Eyes:     Conjunctiva/sclera: Conjunctivae normal.  Neck:     Thyroid: No thyromegaly.  Cardiovascular:     Rate and Rhythm: Normal rate and regular rhythm.     Pulses: Normal pulses.     Heart sounds: Normal heart sounds.  Pulmonary:     Effort: Pulmonary effort is normal. No respiratory distress.     Breath sounds: Normal breath sounds.  Abdominal:     General: Bowel sounds are normal. There is no distension.     Palpations: Abdomen is soft.     Tenderness: There is no abdominal tenderness.  Musculoskeletal:        General: Normal range of motion.     Cervical back: Neck supple.     Right lower leg: No edema.     Left lower leg: No edema.  Lymphadenopathy:     Cervical: No cervical adenopathy.  Skin:    General: Skin is warm and dry.  Neurological:     Mental Status: She is alert and oriented to person, place, and time.  Psychiatric:        Mood and Affect: Mood normal.       ASSESSMENT/ PLAN:  TODAY  Acute allergic rhinitis: more than likely this is viral in nature. If she continues to have symptoms; gets worse; does not get better over the next week; will treat further as indicated. Will begin mucinex 600 mg twice daily through 06-04-23 and flonase daily for one bottle.    Synthia Innocent NP  [facility]  call [phone]

## 2023-06-04 DIAGNOSIS — E1151 Type 2 diabetes mellitus with diabetic peripheral angiopathy without gangrene: Secondary | ICD-10-CM | POA: Diagnosis not present

## 2023-06-04 DIAGNOSIS — I739 Peripheral vascular disease, unspecified: Secondary | ICD-10-CM | POA: Diagnosis not present

## 2023-06-04 DIAGNOSIS — L84 Corns and callosities: Secondary | ICD-10-CM | POA: Diagnosis not present

## 2023-06-04 DIAGNOSIS — E1142 Type 2 diabetes mellitus with diabetic polyneuropathy: Secondary | ICD-10-CM | POA: Diagnosis not present

## 2023-06-07 DIAGNOSIS — Z961 Presence of intraocular lens: Secondary | ICD-10-CM | POA: Diagnosis not present

## 2023-06-07 DIAGNOSIS — H1045 Other chronic allergic conjunctivitis: Secondary | ICD-10-CM | POA: Diagnosis not present

## 2023-06-07 DIAGNOSIS — H401132 Primary open-angle glaucoma, bilateral, moderate stage: Secondary | ICD-10-CM | POA: Diagnosis not present

## 2023-06-18 ENCOUNTER — Encounter (HOSPITAL_COMMUNITY)
Admission: RE | Admit: 2023-06-18 | Discharge: 2023-06-18 | Disposition: A | Discharge status: Home / Self Care | Payer: Medicare Other | Source: Skilled Nursing Facility | Attending: Internal Medicine | Admitting: Internal Medicine

## 2023-06-18 ENCOUNTER — Inpatient Hospital Stay: Payer: Medicare Other

## 2023-06-18 ENCOUNTER — Inpatient Hospital Stay: Payer: Medicare Other | Attending: Hematology | Admitting: Oncology

## 2023-06-18 VITALS — BP 174/75 | HR 66 | Temp 98.1°F | Resp 16

## 2023-06-18 DIAGNOSIS — N1832 Chronic kidney disease, stage 3b: Secondary | ICD-10-CM | POA: Insufficient documentation

## 2023-06-18 DIAGNOSIS — E538 Deficiency of other specified B group vitamins: Secondary | ICD-10-CM | POA: Diagnosis not present

## 2023-06-18 DIAGNOSIS — D631 Anemia in chronic kidney disease: Secondary | ICD-10-CM | POA: Diagnosis not present

## 2023-06-18 DIAGNOSIS — Z86718 Personal history of other venous thrombosis and embolism: Secondary | ICD-10-CM | POA: Insufficient documentation

## 2023-06-18 DIAGNOSIS — E611 Iron deficiency: Secondary | ICD-10-CM | POA: Diagnosis not present

## 2023-06-18 DIAGNOSIS — Z7901 Long term (current) use of anticoagulants: Secondary | ICD-10-CM | POA: Insufficient documentation

## 2023-06-18 LAB — CBC WITH DIFFERENTIAL/PLATELET
Abs Immature Granulocytes: 0.01 10*3/uL (ref 0.00–0.07)
Basophils Absolute: 0.1 10*3/uL (ref 0.0–0.1)
Basophils Relative: 1 %
Eosinophils Absolute: 0.4 10*3/uL (ref 0.0–0.5)
Eosinophils Relative: 6 %
HCT: 36.1 % (ref 36.0–46.0)
Hemoglobin: 11.1 g/dL — ABNORMAL LOW (ref 12.0–15.0)
Immature Granulocytes: 0 %
Lymphocytes Relative: 28 %
Lymphs Abs: 1.9 10*3/uL (ref 0.7–4.0)
MCH: 29.3 pg (ref 26.0–34.0)
MCHC: 30.7 g/dL (ref 30.0–36.0)
MCV: 95.3 fL (ref 80.0–100.0)
Monocytes Absolute: 0.4 10*3/uL (ref 0.1–1.0)
Monocytes Relative: 5 %
Neutro Abs: 4.1 10*3/uL (ref 1.7–7.7)
Neutrophils Relative %: 60 %
Platelets: 192 10*3/uL (ref 150–400)
RBC: 3.79 MIL/uL — ABNORMAL LOW (ref 3.87–5.11)
RDW: 15.2 % (ref 11.5–15.5)
WBC: 6.8 10*3/uL (ref 4.0–10.5)
nRBC: 0 % (ref 0.0–0.2)

## 2023-06-18 LAB — RENAL FUNCTION PANEL
Albumin: 3.6 g/dL (ref 3.5–5.0)
Anion gap: 7 (ref 5–15)
BUN: 18 mg/dL (ref 8–23)
CO2: 25 mmol/L (ref 22–32)
Calcium: 8.9 mg/dL (ref 8.9–10.3)
Chloride: 101 mmol/L (ref 98–111)
Creatinine, Ser: 1.32 mg/dL — ABNORMAL HIGH (ref 0.44–1.00)
GFR, Estimated: 41 mL/min — ABNORMAL LOW (ref >60)
Glucose, Bld: 73 mg/dL (ref 70–99)
Phosphorus: 4 mg/dL (ref 2.5–4.6)
Potassium: 5.1 mmol/L (ref 3.5–5.1)
Sodium: 133 mmol/L — ABNORMAL LOW (ref 135–145)

## 2023-06-18 LAB — COMPREHENSIVE METABOLIC PANEL
ALT: 9 U/L (ref 0–44)
AST: 10 U/L — ABNORMAL LOW (ref 15–41)
Albumin: 4.1 g/dL (ref 3.5–5.0)
Alkaline Phosphatase: 83 U/L (ref 38–126)
Anion gap: 9 (ref 5–15)
BUN: 18 mg/dL (ref 8–23)
CO2: 24 mmol/L (ref 22–32)
Calcium: 9.3 mg/dL (ref 8.9–10.3)
Chloride: 101 mmol/L (ref 98–111)
Creatinine, Ser: 1.36 mg/dL — ABNORMAL HIGH (ref 0.44–1.00)
GFR, Estimated: 40 mL/min — ABNORMAL LOW (ref >60)
Glucose, Bld: 154 mg/dL — ABNORMAL HIGH (ref 70–99)
Potassium: 5.1 mmol/L (ref 3.5–5.1)
Sodium: 134 mmol/L — ABNORMAL LOW (ref 135–145)
Total Bilirubin: 0.5 mg/dL (ref <1.2)
Total Protein: 7.4 g/dL (ref 6.5–8.1)

## 2023-06-18 LAB — FERRITIN: Ferritin: 355 ng/mL — ABNORMAL HIGH (ref 11–307)

## 2023-06-18 LAB — CBC
HCT: 30.8 % — ABNORMAL LOW (ref 36.0–46.0)
Hemoglobin: 9.7 g/dL — ABNORMAL LOW (ref 12.0–15.0)
MCH: 29.3 pg (ref 26.0–34.0)
MCHC: 31.5 g/dL (ref 30.0–36.0)
MCV: 93.1 fL (ref 80.0–100.0)
Platelets: 183 10*3/uL (ref 150–400)
RBC: 3.31 MIL/uL — ABNORMAL LOW (ref 3.87–5.11)
RDW: 15.1 % (ref 11.5–15.5)
WBC: 5.9 10*3/uL (ref 4.0–10.5)
nRBC: 0 % (ref 0.0–0.2)

## 2023-06-18 LAB — IRON AND TIBC
Iron: 30 ug/dL (ref 28–170)
Saturation Ratios: 15 % (ref 10.4–31.8)
TIBC: 196 ug/dL — ABNORMAL LOW (ref 250–450)
UIBC: 166 ug/dL

## 2023-06-18 LAB — PROTEIN, TOTAL: Total Protein: 6.4 g/dL — ABNORMAL LOW (ref 6.5–8.1)

## 2023-06-18 NOTE — Progress Notes (Signed)
Hemoglobin today is 11.1.  Hold Retacrit injection today per treatment parameters by MD.

## 2023-06-18 NOTE — Progress Notes (Signed)
[facility] [address], [place] [address]   CLINIC:  Medical Oncology/Hematology  PCP:  Traci Holster, NP  [address] [place] [address] [phone]   REASON FOR VISIT:  Follow-up for anemia due to CKD stage IIIb   CURRENT THERAPY: IV iron & Retacrit   INTERVAL HISTORY:   Traci Parker 77 y.o. female returns for routine follow-up of anemia.  She was last seen by Traci Brenner PA-C on 03/31/2023.  In the interim, she denies any hospitalizations, surgeries or changes to her baseline health. She has been receiving Retacrit 10,000 units monthly for several years now.  At today's visit, she reports feeling fair.  She denies any major changes in her health or symptoms since her last visit.  She continues to receive Retacrit 10,000 units each month.  She reports feeling improved energy since starting this treatment regimen.  She is tolerating Retacrit well and BP has remained stable. No symptoms of DVT or PE.   She has not noticed any blood loss such as hematemesis, hematochezia, melena, or epistaxis.   No pica, restless legs, or headaches. No chest pain, dyspnea on exertion, lightheadedness, or syncope.  She continues to take folic acid and Vitamin B12 657 mcg every other day. Using otc eye drops for glaucoma.  She has follow-up with ophthalmology at the end of the month.  Appetite is at 100% energy levels are 75%.  Denies any change in her weight.  ASSESSMENT & PLAN:  1.  Normocytic anemia, secondary to CKD and relative iron deficiency - Etiology is from combination of CKD and relative iron deficiency. - Initial work-up included SPEP which was negative.  Stool was negative for occult blood.  Hemoglobin electrophoresis showed severe microcytosis.  She could have underlying thalassemia/sickle cell trait. - Last colonoscopy was in June 2019 in [place], 3 polyps removed. - She has a history of needing a blood transfusion in the early 2000's. -Iron  supplement discontinued in June 2024 due to elevated ferritin. - She receives Retacrit 10,000 units once a month - She denies any bright red blood per rectum or melena        2.  CKD stage IIIa/b - Ultrasound the kidneys on 08/22/2019 showed bilateral renal atrophy, severe atrophy of the right kidney.   3.  B12 and folic acid deficiency: - She previously received B12 injections monthly at SNF - She is currently taking folic acid every other day + vitamin B12 500 mcg every other day.     4.  History of lower extremity DVT - Venous duplex (02/02/2020): Extensive acute appearing left femoropopliteal occlusive DVT extending into the calf veins. - Considered provoked in the setting of immobility/bedbound functional status - Venous ultrasound (03/24/2022) shows a previous left-sided femoral, popliteal, and DVT from 2021 has completely resolved - Eliquis was discontinued by her PCP, placed on aspirin and statin - No current signs or symptoms of recurrent DVT     PLAN: 1. Anemia due to stage 3b chronic kidney disease (HCC) (Primary) -Lab work from 06/18/2023 shows hemoglobin of 11.1 (9.7), MCV 95.3, ferritin 355, iron saturations 15% with TIBC 196. -Patient does not need Retacrit today given hemoglobin is above 11. -RTC monthly for lab work and Retacrit if hemoglobin is less then 11. - RTC in 12 weeks for office visit, labs and possible Retacrit.   2. CKD stage III - Most recent CMP shows baseline CKD stage IIIb - Continue follow-up with Dr. Wolfgang Parker  3.  B12 and folic acid deficiency: -Labs (03/24/23) showed vitamin B12 at goal (1068), normal MMA, normal folate. -Continue vitamin B12 500 mcg EOD and folic acid supplements EOD -Will recheck levels at follow-up visit in 6 months (March 2025).    PLAN SUMMARY: >> CBC + Retacrit every 4 weeks >> Labs in 3 months = CBC/D, CMP, ferritin, iron/TIBC, folate , B12 and MMA >> OFFICE visit in 3 months (same day as labs)     REVIEW OF SYSTEMS:   Review of Systems  Constitutional:  Negative for fatigue.  Respiratory:  Negative for cough and shortness of breath.   Gastrointestinal:  Negative for abdominal pain, blood in stool, constipation, diarrhea, rectal pain and vomiting.     PHYSICAL EXAM:  ECOG PERFORMANCE STATUS: 3 - Symptomatic, >50% confined to bed Vitals:   06/18/23 1343  BP: (!) 174/75  Pulse: 66  Resp: 16  Temp: 98.1 F (36.7 C)  SpO2: 99%   There were no vitals filed for this visit. Physical Exam Constitutional:      Appearance: Normal appearance.     Comments: Presents in wheelchair  HENT:     Head: Normocephalic and atraumatic.     Mouth/Throat:     Mouth: Mucous membranes are moist.  Eyes:     Extraocular Movements: Extraocular movements intact.     Pupils: Pupils are equal, round, and reactive to light.  Cardiovascular:     Rate and Rhythm: Normal rate and regular rhythm.     Pulses: Normal pulses.     Heart sounds: Normal heart sounds.  Pulmonary:     Effort: Pulmonary effort is normal.     Breath sounds: Normal breath sounds.  Abdominal:     General: Bowel sounds are normal.     Palpations: Abdomen is soft.     Tenderness: There is no abdominal tenderness.  Musculoskeletal:        General: No swelling.     Right lower leg: No edema.     Left lower leg: No edema.  Lymphadenopathy:     Cervical: No cervical adenopathy.  Skin:    General: Skin is warm and dry.  Neurological:     General: No focal deficit present.     Mental Status: She is alert and oriented to person, place, and time.  Psychiatric:        Mood and Affect: Mood normal.        Behavior: Behavior normal.    PAST MEDICAL/SURGICAL HISTORY:  Past Medical History:  Diagnosis Date   Allergy    Anemia    pernicious anemia   Arthritis    Asthma    Blood transfusion without reported diagnosis    CKD (chronic kidney disease)    COPD (chronic obstructive pulmonary disease) (HCC) 12/23/2019   Diabetic peripheral neuropathy  associated with type 2 diabetes mellitus (HCC) 04/07/2021   DM (diabetes mellitus), type 2 with peripheral vascular complications (HCC) 12/23/2019   Gastritis    GERD (gastroesophageal reflux disease)    Glaucoma    History of blood in urine    Hyperlipidemia    Hypertension    Neuropathy    both feet   Ocular hypertension    Osteoporosis    Stroke (HCC)    Vitamin B12 deficiency anemia due to intrinsic factor deficiency    Past Surgical History:  Procedure Laterality Date   CATARACT EXTRACTION W/PHACO Right 11/27/2022   Procedure: CATARACT EXTRACTION PHACO AND INTRAOCULAR LENS PLACEMENT (IOC);  Surgeon: [name],  MD;  Location: [facility];  Service: Ophthalmology;  Laterality: Right;  CDE: 15.67   CATARACT EXTRACTION W/PHACO Left 12/18/2022   Procedure: CATARACT EXTRACTION PHACO AND INTRAOCULAR LENS PLACEMENT (IOC);  Surgeon: [name], MD;  Location: [facility];  Service: Ophthalmology;  Laterality: Left;  CDE: 11.78   CHOLECYSTECTOMY  2008   COLONOSCOPY  04/30/2011   Procedure: COLONOSCOPY;  Surgeon: Malissa Hippo, MD;  Location: [facility];  Service: Endoscopy;  Laterality: N/A;   COLONOSCOPY  05/25/2012   Procedure: COLONOSCOPY;  Surgeon: Malissa Hippo, MD;  Location: [facility];  Service: Endoscopy;  Laterality: N/A;  1:25-changed to 1200 Ann to notify pt   COLONOSCOPY Bilateral 12/2017   COLONOSCOPY WITH PROPOFOL N/A 11/29/2020   Procedure: COLONOSCOPY WITH PROPOFOL;  Surgeon: Dolores Frame, MD;  Location: [facility];  Service: Gastroenterology;  Laterality: N/A;  1:35 patient is in the [facility]   ESOPHAGOGASTRODUODENOSCOPY N/A 12/07/2019   Procedure: ESOPHAGOGASTRODUODENOSCOPY (EGD);  Surgeon: Malissa Hippo, MD;  Location: [facility];  Service: Endoscopy;  Laterality: N/A;  155   GALLBLADDER SURGERY     right breast cystectomy  1988   TUBAL LIGATION  1975    SOCIAL HISTORY:  Social History   Socioeconomic History   Marital status:  Divorced    Spouse name: Not on file   Number of children: 4   Years of education: 9   Highest education level: Not on file  Occupational History   Occupation: retired   Tobacco Use   Smoking status: Former    Current packs/day: 0.00    Average packs/day: 1 pack/day for 40.0 years (40.0 ttl pk-yrs)    Types: Cigarettes    Start date: 04/03/1971    Quit date: 04/03/2011    Years since quitting: 12.2   Smokeless tobacco: Never  Vaping Use   Vaping status: Never Used  Substance and Sexual Activity   Alcohol use: No   Drug use: No   Sexual activity: Not Currently  Other Topics Concern   Not on file  Social History Narrative   01/17/[birth year] lives at [facility], [place]   Social Determinants of Health   Financial Resource Strain: Not on file  Food Insecurity: Not on file  Transportation Needs: Not on file  Physical Activity: Not on file  Stress: Not on file  Social Connections: Not on file  Intimate Partner Violence: Not on file    FAMILY HISTORY:  Family History  Problem Relation Age of Onset   Diabetes Mother    Hyperlipidemia Mother    Hypertension Mother    Heart disease Father    Hyperlipidemia Father    Diabetes Sister    Hyperlipidemia Sister    Hypertension Sister    Cancer Brother    Heart disease Brother    Hyperlipidemia Brother    Hypertension Brother    Diabetes Sister     CURRENT MEDICATIONS:  Outpatient Encounter Medications as of 06/18/2023  Medication Sig   aspirin EC 81 MG tablet Take 81 mg by mouth daily. Swallow whole.   brimonidine-timolol (COMBIGAN) 0.2-0.5 % ophthalmic solution Place 1 drop into both eyes every 12 (twelve) hours.   Calcium Carb-Cholecalciferol (CALCIUM 500 + D) 500-5 MG-MCG TABS Take 1 tablet by mouth daily.   cyanocobalamin (V-R VITAMIN B-12) 500 MCG tablet Take 1 tablet (500 mcg total) by mouth every other day.   fluticasone (FLONASE) 50 MCG/ACT nasal spray Place 2 sprays into both nostrils daily.  folic acid (V-R FOLIC  ACID) 400 MCG tablet Take 1 tablet (400 mcg total) by mouth every other day.   gabapentin (NEURONTIN) 100 MG capsule Take 200 mg by mouth at bedtime. (2100)   gabapentin (NEURONTIN) 100 MG capsule Take 100 mg by mouth daily.   losartan (COZAAR) 25 MG tablet Take 25 mg by mouth daily.   NON FORMULARY Diet:Regular thin liquids  ALLERGIC TO FISH   omeprazole (PRILOSEC) 40 MG capsule Take 40 mg by mouth daily at 6 (six) AM. (0600)   rosuvastatin (CRESTOR) 20 MG tablet Take 20 mg by mouth daily.   senna-docusate (SENOKOT S) 8.6-50 MG tablet Take 1 tablet by mouth 2 (two) times daily.   sodium chloride 1 g tablet Take 1 g by mouth 2 (two) times daily with a meal.   No facility-administered encounter medications on file as of 06/18/2023.    ALLERGIES:  Allergies  Allergen Reactions   Latex Rash   Levofloxacin Nausea And Vomiting   Neomycin-Bacitracin-Polymyxin  [Bacitracin-Neomycin-Polymyxin] Rash   Other Swelling and Rash    TOMATO  SOUP-PER MAR   Prednisone Hives, Itching, Rash and Other (See Comments)   Fish Allergy     rash   Shellfish Allergy Hives   Neosporin [Neomycin-Polymyxin-Gramicidin] Itching and Rash    LABORATORY DATA:  I have reviewed the labs as listed.  CBC    Component Value Date/Time   WBC 6.8 06/18/2023 1316   RBC 3.79 (L) 06/18/2023 1316   HGB 11.1 (L) 06/18/2023 1316   HCT 36.1 06/18/2023 1316   PLT 192 06/18/2023 1316   MCV 95.3 06/18/2023 1316   MCH 29.3 06/18/2023 1316   MCHC 30.7 06/18/2023 1316   RDW 15.2 06/18/2023 1316   LYMPHSABS 1.9 06/18/2023 1316   MONOABS 0.4 06/18/2023 1316   EOSABS 0.4 06/18/2023 1316   BASOSABS 0.1 06/18/2023 1316      Latest Ref Rng & Units 06/18/2023    6:00 AM 03/24/2023    1:44 PM 02/23/2023    8:00 AM  CMP  Glucose 70 - 99 mg/dL 73  161  89   BUN 8 - 23 mg/dL 18  18  18    Creatinine 0.44 - 1.00 mg/dL 0.96  0.45  4.09   Sodium 135 - 145 mmol/L 133  134  133   Potassium 3.5 - 5.1 mmol/L 5.1  4.7  4.5   Chloride  98 - 111 mmol/L 101  105  102   CO2 22 - 32 mmol/L 25  24  24    Calcium 8.9 - 10.3 mg/dL 8.9  8.4  8.7    8.9   Total Protein 6.5 - 8.1 g/dL 6.4  6.9    Total Bilirubin 0.3 - 1.2 mg/dL  0.2    Alkaline Phos 38 - 126 U/L  70    AST 15 - 41 U/L  11    ALT 0 - 44 U/L  11      DIAGNOSTIC IMAGING:  I have independently reviewed the relevant imaging and discussed with the patient.   WRAP UP:  All questions were answered. The patient knows to call the clinic with any problems, questions or concerns.  Medical decision making: Moderate  Time spent on visit: I spent 20 minutes dedicated to the care of this patient (face-to-face and non-face-to-face) on the date of the encounter to include what is described in the assessment and plan.  Mauro Kaufmann, NP  06/18/23 1:48 PM

## 2023-06-22 ENCOUNTER — Encounter: Payer: Self-pay | Admitting: Adult Health

## 2023-06-22 ENCOUNTER — Non-Acute Institutional Stay (SKILLED_NURSING_FACILITY): Payer: Medicare Other | Admitting: Adult Health

## 2023-06-22 DIAGNOSIS — E069 Thyroiditis, unspecified: Secondary | ICD-10-CM | POA: Diagnosis not present

## 2023-06-22 DIAGNOSIS — G959 Disease of spinal cord, unspecified: Secondary | ICD-10-CM

## 2023-06-22 DIAGNOSIS — E559 Vitamin D deficiency, unspecified: Secondary | ICD-10-CM

## 2023-06-22 NOTE — Progress Notes (Signed)
Location:  [facility] Room Number: 150 Place of Service:  SNF (31)   CODE STATUS: dnr   Allergies  Allergen Reactions   Latex Rash   Levofloxacin Nausea And Vomiting   Neomycin-Bacitracin-Polymyxin  [Bacitracin-Neomycin-Polymyxin] Rash   Other Swelling and Rash    TOMATO  SOUP-PER MAR   Prednisone Hives, Itching, Rash and Other (See Comments)   Fish Allergy     rash   Shellfish Allergy Hives   Neosporin [Neomycin-Polymyxin-Gramicidin] Itching and Rash    Chief Complaint  Patient presents with   Medical Management of Chronic Issues        Vitamin D deficiency:  Thyroiditis:    Cervical myelopathy     HPI:  She is a 77 year old long term resident of this facility being seen for the management of her chronic illnesses: Vitamin D deficiency:  Thyroiditis:    Cervical myelopathy. There are no reports of uncontrolled pain. Her weight is stable. She continues to have long term cold intolerance.   Past Medical History:  Diagnosis Date   Allergy    Anemia    pernicious anemia   Arthritis    Asthma    Blood transfusion without reported diagnosis    CKD (chronic kidney disease)    COPD (chronic obstructive pulmonary disease) (HCC) 12/23/2019   Diabetic peripheral neuropathy associated with type 2 diabetes mellitus (HCC) 04/07/2021   DM (diabetes mellitus), type 2 with peripheral vascular complications (HCC) 12/23/2019   Gastritis    GERD (gastroesophageal reflux disease)    Glaucoma    History of blood in urine    Hyperlipidemia    Hypertension    Neuropathy    both feet   Ocular hypertension    Osteoporosis    Stroke (HCC)    Vitamin B12 deficiency anemia due to intrinsic factor deficiency     Past Surgical History:  Procedure Laterality Date   CATARACT EXTRACTION W/PHACO Right 11/27/2022   Procedure: CATARACT EXTRACTION PHACO AND INTRAOCULAR LENS PLACEMENT (IOC);  Surgeon: [name], MD;  Location: [facility];  Service: Ophthalmology;   Laterality: Right;  CDE: 15.67   CATARACT EXTRACTION W/PHACO Left 12/18/2022   Procedure: CATARACT EXTRACTION PHACO AND INTRAOCULAR LENS PLACEMENT (IOC);  Surgeon: [name], MD;  Location: [facility];  Service: Ophthalmology;  Laterality: Left;  CDE: 11.78   CHOLECYSTECTOMY  2008   COLONOSCOPY  04/30/2011   Procedure: COLONOSCOPY;  Surgeon: Malissa Hippo, MD;  [facility]: [facility];  Service: Endoscopy;  Laterality: N/A;   COLONOSCOPY  05/25/2012   Procedure: COLONOSCOPY;  Surgeon: Malissa Hippo, MD;  [facility];  Service: Endoscopy;  Laterality: N/A;  1:25-changed to 1200 Ann to notify pt   COLONOSCOPY Bilateral 12/2017   COLONOSCOPY WITH PROPOFOL N/A 11/29/2020   Procedure: COLONOSCOPY WITH PROPOFOL;  Surgeon: Dolores Frame, MD;  [facility];  Service: Gastroenterology;  Laterality: N/A;  1:35 patient is in the [facility]   ESOPHAGOGASTRODUODENOSCOPY N/A 12/07/2019   Procedure: ESOPHAGOGASTRODUODENOSCOPY (EGD);  Surgeon: Malissa Hippo, MD;  [facility]: [facility];  Service: Endoscopy;  Laterality: N/A;  155   GALLBLADDER SURGERY     right breast cystectomy  1988   TUBAL LIGATION  1975    Social History   Socioeconomic History   Marital status: Divorced    Spouse name: Not on file   Number of children: 4   Years of education: 9   Highest education level: Not on  file  Occupational History   Occupation: retired   Tobacco Use   Smoking status: Former    Current packs/day: 0.00    Average packs/day: 1 pack/day for 40.0 years (40.0 ttl pk-yrs)    Types: Cigarettes    Start date: 04/03/1971    Quit date: 04/03/2011    Years since quitting: 12.2   Smokeless tobacco: Never  Vaping Use   Vaping status: Never Used  Substance and Sexual Activity   Alcohol use: No   Drug use: No   Sexual activity: Not Currently  Other Topics Concern   Not on file  Social History Narrative   01/17/20 lives at [facility], [place]    Social  Determinants of Health   Financial Resource Strain: Not on file  Food Insecurity: Not on file  Transportation Needs: Not on file  Physical Activity: Not on file  Stress: Not on file  Social Connections: Not on file  Intimate Partner Violence: Not on file   Family History  Problem Relation Age of Onset   Diabetes Mother    Hyperlipidemia Mother    Hypertension Mother    Heart disease Father    Hyperlipidemia Father    Diabetes Sister    Hyperlipidemia Sister    Hypertension Sister    Cancer Brother    Heart disease Brother    Hyperlipidemia Brother    Hypertension Brother    Diabetes Sister       VITAL SIGNS BP 110/68   Pulse 79   Temp (!) 97.4 F (36.3 C)   Resp 18   Ht 5\' 4"  (1.626 m)   Wt 123 lb 9.6 oz (56.1 kg)   SpO2 98%   BMI 21.22 kg/m   Outpatient Encounter Medications as of 06/22/2023  Medication Sig   aspirin EC 81 MG tablet Take 81 mg by mouth daily. Swallow whole.   brimonidine-timolol (COMBIGAN) 0.2-0.5 % ophthalmic solution Place 1 drop into both eyes every 12 (twelve) hours.   Calcium Carb-Cholecalciferol (CALCIUM 500 + D) 500-5 MG-MCG TABS Take 1 tablet by mouth daily.   cyanocobalamin (V-R VITAMIN B-12) 500 MCG tablet Take 1 tablet (500 mcg total) by mouth every other day.   fluticasone (FLONASE) 50 MCG/ACT nasal spray Place 2 sprays into both nostrils daily.   folic acid (V-R FOLIC ACID) 400 MCG tablet Take 1 tablet (400 mcg total) by mouth every other day.   gabapentin (NEURONTIN) 100 MG capsule Take 200 mg by mouth at bedtime. (2100)   gabapentin (NEURONTIN) 100 MG capsule Take 100 mg by mouth daily.   losartan (COZAAR) 25 MG tablet Take 25 mg by mouth daily.   NON FORMULARY Diet:Regular thin liquids  ALLERGIC TO FISH   omeprazole (PRILOSEC) 40 MG capsule Take 40 mg by mouth daily at 6 (six) AM. (0600)   rosuvastatin (CRESTOR) 20 MG tablet Take 20 mg by mouth daily.   senna-docusate (SENOKOT S) 8.6-50 MG tablet Take 1 tablet by mouth 2 (two)  times daily.   sodium chloride 1 g tablet Take 1 g by mouth 2 (two) times daily with a meal.   No facility-administered encounter medications on file as of 06/22/2023.     SIGNIFICANT DIAGNOSTIC EXAMS  05-24-20: dexa: t score -2.387  NO NEW EXAMS.     LABS REVIEWED PREVIOUS     09-10-22: wbc 6.5; hgb 10.5; hct 33.0; mcv 93.0 plt 181; glucose 113; bun 19; creat 1.27; k+ 4.6; na++ 133; ca 9.2; gfr 43; protein 7.4 albumin 4.1;  vitamin B 12: 1756; folate >40; iron 63 tibc 191 10-01-22: chol 137; ldl 80; trig 53; hdl 46 11-02-22: wbc 6.3; hgb 9.1; hct 28.8; mcv 94.1 plt 136; glucose 78; bun 19; creat 1.44; k+ 3.9; na++ 135; ca 8.7; gfr 37; protein 6.0 albumin 3.3 11-05-22: ACR 0.17 11-06-22: PTH 25 ca 9.4 11-11-22; folate 32.8 vitamin B12: 459; iron 30; tibc 172; ferritin 452; serotonin 78 12-31-22: wbc 5.4; hgb 10.9; hct 34.9; mcv 93.8 plt 167; hgb A1c 4.9; vitamin B12: 829; folate 39.3; vitamin D 59.03 01-27-23: wbc 7.2; hgb 11.3; hct 36.1; mcv 91.6 plt 202  TODAY  03-24-23: wbc 6.2; hgb 9.8; hct 31.5; mcv 93.5; plt 235; glucose 131; bun 18; creat 1.44; k+ 4.7; na++ 134; ca 8.4; gfr 37; protein 6.9; albumin 3.7  vitamin B12: 1068; iron 33; tibc 173 04-23-23: wbc 7.4; hgb 10.4; hct 34.0; mcv 95.8 plt 143 05-20-23: tsh 2.925 05-21-23: wbc 5.7; hgb 10.4; hct 34.0; mcv 95.2 plt 149    06-18-23: wbc 6.8; hgb 11.1; hct 36.1; mcv 95.3 plt 192; glucose 154; bun 18; creat 1.36; k+ 5.1; na++ 134; ca 9.3; gfr 40 protein 7.4; albumin 4.1  Review of Systems  Constitutional:  Negative for malaise/fatigue.  Respiratory:  Negative for cough and shortness of breath.   Cardiovascular:  Negative for chest pain, palpitations and leg swelling.  Gastrointestinal:  Negative for abdominal pain, constipation and heartburn.  Musculoskeletal:  Negative for back pain, joint pain and myalgias.  Skin: Negative.   Neurological:  Negative for dizziness.  Psychiatric/Behavioral:  The patient is not nervous/anxious.     Physical Exam Constitutional:      General: She is not in acute distress.    Appearance: She is well-developed. She is not diaphoretic.  Neck:     Thyroid: No thyromegaly.  Cardiovascular:     Rate and Rhythm: Normal rate and regular rhythm.     Pulses: Normal pulses.     Heart sounds: Normal heart sounds.  Pulmonary:     Effort: Pulmonary effort is normal. No respiratory distress.     Breath sounds: Normal breath sounds.  Abdominal:     General: Bowel sounds are normal. There is no distension.     Palpations: Abdomen is soft.     Tenderness: There is no abdominal tenderness.  Musculoskeletal:        General: Normal range of motion.     Cervical back: Neck supple.     Right lower leg: No edema.     Left lower leg: No edema.  Lymphadenopathy:     Cervical: No cervical adenopathy.  Skin:    General: Skin is warm and dry.  Neurological:     Mental Status: She is alert and oriented to person, place, and time.  Psychiatric:        Mood and Affect: Mood normal.     ASSESSMENT/ PLAN:  TODAY   Vitamin D deficiency: level is 41.14; on 1000 units daily   2. Thyroiditis: tsh 2.925; will monitor   3. Cervical myelopathy: will monitor is without change   PREVIOUS    4. Failure to thrive in adult: weight is 123 pounds; will continue to monitor   5. Aortic atherosclerosis: (ct 12-23-19) will monitor  6. History of dvt in left lower extremity: (02-02-20) induced by megace is on long term asa 81 mg daily   7. Chronic obstructive pulmonary disease unspecified COPD type: will monitor   8. Hypertension associated with stage 3 chronic kidney disease  due to type 2 diabetes mellitus: b/p 110/68 will continue cozaar 25 mg daily   9. CKD stage 3 due to type 2 diabetes mellitus: bun 18; creat 1.36 gfr 40  10. Diabetic peripheral neuropathy associated with type 2 diabetes mellitus: is on gabapentin 100 mg in the AM and 200 mg at hs   11. Type 2 diabetes mellitus with stage 3 chronic  kidney disease without long term current use of insulin: hgb A1c 5.3; is on asa 81 mg daily statin and arb   12. Hyperlipidemia associated with type 2 diabetes mellitus: ldl 63 is on crestor 10 mg daily   13. Anemia due to chronic renal failure with erythropoietin stage 3: hgb 11.1; will continue iron daily is followed by oncology   14. Other chronic gastritis without hemorrhage: will continue prilosec 40 mg daily   15. Increased intraocular pressure bilateral: will continue truspot to both eyes twice daily   16. Vitamin B 12 deficiency: level 1068; folate 32.8           Synthia Innocent NP [facility]  call [phone]

## 2023-06-25 DIAGNOSIS — I129 Hypertensive chronic kidney disease with stage 1 through stage 4 chronic kidney disease, or unspecified chronic kidney disease: Secondary | ICD-10-CM | POA: Diagnosis not present

## 2023-06-25 DIAGNOSIS — N1832 Chronic kidney disease, stage 3b: Secondary | ICD-10-CM | POA: Diagnosis not present

## 2023-06-25 DIAGNOSIS — R809 Proteinuria, unspecified: Secondary | ICD-10-CM | POA: Diagnosis not present

## 2023-06-25 DIAGNOSIS — E1122 Type 2 diabetes mellitus with diabetic chronic kidney disease: Secondary | ICD-10-CM | POA: Diagnosis not present

## 2023-07-01 ENCOUNTER — Encounter: Payer: Self-pay | Admitting: Hematology

## 2023-07-02 ENCOUNTER — Other Ambulatory Visit: Payer: Medicare Other

## 2023-07-02 ENCOUNTER — Inpatient Hospital Stay: Payer: Medicare Other

## 2023-07-12 DIAGNOSIS — H1045 Other chronic allergic conjunctivitis: Secondary | ICD-10-CM | POA: Diagnosis not present

## 2023-07-12 DIAGNOSIS — H401132 Primary open-angle glaucoma, bilateral, moderate stage: Secondary | ICD-10-CM | POA: Diagnosis not present

## 2023-07-12 DIAGNOSIS — Z961 Presence of intraocular lens: Secondary | ICD-10-CM | POA: Diagnosis not present

## 2023-07-14 ENCOUNTER — Encounter (HOSPITAL_COMMUNITY)
Admission: RE | Admit: 2023-07-14 | Discharge: 2023-07-14 | Disposition: A | Discharge status: Home / Self Care | Payer: Medicare Other | Source: Skilled Nursing Facility | Attending: Internal Medicine | Admitting: Internal Medicine

## 2023-07-14 DIAGNOSIS — U071 COVID-19: Secondary | ICD-10-CM | POA: Diagnosis not present

## 2023-07-14 DIAGNOSIS — N1832 Chronic kidney disease, stage 3b: Secondary | ICD-10-CM | POA: Insufficient documentation

## 2023-07-14 DIAGNOSIS — Z8616 Personal history of COVID-19: Secondary | ICD-10-CM | POA: Diagnosis not present

## 2023-07-14 DIAGNOSIS — J9811 Atelectasis: Secondary | ICD-10-CM | POA: Diagnosis not present

## 2023-07-14 LAB — BASIC METABOLIC PANEL
Anion gap: 8 (ref 5–15)
BUN: 18 mg/dL (ref 8–23)
CO2: 25 mmol/L (ref 22–32)
Calcium: 9 mg/dL (ref 8.9–10.3)
Chloride: 101 mmol/L (ref 98–111)
Creatinine, Ser: 1.39 mg/dL — ABNORMAL HIGH (ref 0.44–1.00)
GFR, Estimated: 39 mL/min — ABNORMAL LOW (ref >60)
Glucose, Bld: 117 mg/dL — ABNORMAL HIGH (ref 70–99)
Potassium: 4.6 mmol/L (ref 3.5–5.1)
Sodium: 134 mmol/L — ABNORMAL LOW (ref 135–145)

## 2023-07-14 LAB — D-DIMER, QUANTITATIVE: D-Dimer, Quant: 1.18 ug{FEU}/mL — ABNORMAL HIGH (ref 0.00–0.50)

## 2023-07-14 LAB — C-REACTIVE PROTEIN: CRP: 0.8 mg/dL (ref <1.0)

## 2023-07-14 LAB — CBC
HCT: 31 % — ABNORMAL LOW (ref 36.0–46.0)
Hemoglobin: 9.9 g/dL — ABNORMAL LOW (ref 12.0–15.0)
MCH: 29.4 pg (ref 26.0–34.0)
MCHC: 31.9 g/dL (ref 30.0–36.0)
MCV: 92 fL (ref 80.0–100.0)
Platelets: 144 10*3/uL — ABNORMAL LOW (ref 150–400)
RBC: 3.37 MIL/uL — ABNORMAL LOW (ref 3.87–5.11)
RDW: 14.9 % (ref 11.5–15.5)
WBC: 4.8 10*3/uL (ref 4.0–10.5)
nRBC: 0 % (ref 0.0–0.2)

## 2023-07-15 ENCOUNTER — Encounter: Payer: Self-pay | Admitting: Internal Medicine

## 2023-07-15 ENCOUNTER — Non-Acute Institutional Stay (SKILLED_NURSING_FACILITY): Payer: Medicare Other | Admitting: Internal Medicine

## 2023-07-15 DIAGNOSIS — I10 Essential (primary) hypertension: Secondary | ICD-10-CM

## 2023-07-15 DIAGNOSIS — I700 Atherosclerosis of aorta: Secondary | ICD-10-CM

## 2023-07-15 DIAGNOSIS — N183 Chronic kidney disease, stage 3 unspecified: Secondary | ICD-10-CM | POA: Diagnosis not present

## 2023-07-15 DIAGNOSIS — R768 Other specified abnormal immunological findings in serum: Secondary | ICD-10-CM | POA: Diagnosis not present

## 2023-07-15 DIAGNOSIS — E1122 Type 2 diabetes mellitus with diabetic chronic kidney disease: Secondary | ICD-10-CM

## 2023-07-15 NOTE — Progress Notes (Signed)
 NURSING HOME LOCATION:  [facility] ROOM NUMBER:  150 P  CODE STATUS:  DNR  PCP:  Barnie Seip NP  This is a nursing facility follow up visit for specific acute issue of positive COVID screening.    Interim medical record and care since last SNF visit was updated with review of diagnostic studies and change in clinical status since last visit were documented.  HPI: COVID screening was positive.  Labs revealed D-dimer of 1.18 with normals less than 0.5.  C-reactive protein was 0.8.  CKD is relatively stable with current creatinine 1.39 and GFR 39; prior values were 1.36 and GFR 40.  This indicates CKD stage IIIb.  The chronic anemia is essentially stable although H/H has varied from a nadir of 9.7/30.8 up to high of 11.1/36.1.  Current H/H was 9.9/31.  Minimal thrombocytopenia is present with a platelet count of 144,000.  Chronic hyponatremia is stable with current value of 134. Chest x-ray revealed minimal right base atelectasis.  As per protocol oral antiviral was initiated.  Review of systems: She states this is the third time she has had COVID; she is fully vaccinated.  At this time she has a minor intermittent nonproductive cough.  She also describes sleepiness and fatigue.  She has no other COVID-related symptoms. Cold intolerance is a chronic issue as it is peripheral neuropathy symptoms. She stated she recently had some eye irritation related eyedrops following cataract surgery which necessitated stopping the medication.  The symptoms have resolved.  Constitutional: No fever, significant weight change  Eyes: No current redness, discharge, pain, vision change ENT/mouth: No nasal congestion,  purulent discharge, earache, change in hearing, sore throat  Cardiovascular: No chest pain, palpitations, paroxysmal nocturnal dyspnea, claudication, edema  Respiratory: No sputum production, hemoptysis, DOE, significant snoring, apnea   Gastrointestinal: No heartburn,  dysphagia, abdominal pain, nausea /vomiting, rectal bleeding, melena, change in bowels Genitourinary: No dysuria, hematuria, pyuria, incontinence, nocturia Musculoskeletal: No joint stiffness, joint swelling, weakness, pain Dermatologic: No rash, pruritus, change in appearance of skin Neurologic: No dizziness, headache, syncope, seizures Psychiatric: No significant anxiety, depression, insomnia, anorexia Endocrine: No change in hair/skin/nails, excessive thirst, excessive hunger, excessive urination  Hematologic/lymphatic: No significant bruising, lymphadenopathy, abnormal bleeding Allergy/immunology: No itchy/watery eyes, significant sneezing, urticaria, angioedema  Physical exam:  Pertinent or positive findings: She appears her age; she exhibits no respiratory distress.  Arcus senilis is present.  She is edentulous.  Heart sounds are somewhat distant.  She has minimal low-grade diffuse rales.  Pedal pulses are nonpalpable.  She has irregular hyperpigmentation over the malar areas and nose.  As is always the case she is wearing mittens & wool socks & has a blanket across her lap because of the cold intolerance.  General appearance: Adequately nourished; no acute distress, increased work of breathing is present.   Lymphatic: No lymphadenopathy about the head, neck, axilla. Eyes: No conjunctival inflammation or lid edema is present. There is no scleral icterus. Ears:  External ear exam shows no significant lesions or deformities.   Nose:  External nasal examination shows no deformity or inflammation. Nasal mucosa are pink and moist without lesions, exudates Neck:  No thyromegaly, masses, tenderness noted.    Heart:  No gallop, murmur, click, rub .  Lungs: without wheezes, rhonchi, rubs. Abdomen: Bowel sounds are normal. Abdomen is soft and nontender with no organomegaly, hernias, masses. GU: Deferred  Extremities:  No cyanosis, clubbing, edema  Skin: Warm & dry w/o tenting. No significant  rash.  See summary under each active problem in the Problem List with associated updated therapeutic plan

## 2023-07-15 NOTE — Patient Instructions (Signed)
 See assessment and plan under each diagnosis in the problem list and acutely for this visit

## 2023-07-15 NOTE — Assessment & Plan Note (Signed)
 BP controlled; no change in antihypertensive medications

## 2023-07-15 NOTE — Assessment & Plan Note (Signed)
 She denies any anginal equivalent.  No change indicated.

## 2023-07-15 NOTE — Assessment & Plan Note (Signed)
 CKD is Stage 3b & stable. A1c in June was prediabetic. Med List reviewed; no change indicated.

## 2023-07-16 ENCOUNTER — Inpatient Hospital Stay: Payer: Medicare Other

## 2023-07-30 ENCOUNTER — Inpatient Hospital Stay: Payer: Medicare Other

## 2023-07-30 ENCOUNTER — Other Ambulatory Visit: Payer: Medicare Other

## 2023-08-03 ENCOUNTER — Encounter: Payer: Self-pay | Admitting: Internal Medicine

## 2023-08-03 ENCOUNTER — Non-Acute Institutional Stay (SKILLED_NURSING_FACILITY): Payer: Medicare Other | Admitting: Internal Medicine

## 2023-08-03 DIAGNOSIS — E1142 Type 2 diabetes mellitus with diabetic polyneuropathy: Secondary | ICD-10-CM

## 2023-08-03 DIAGNOSIS — N1832 Chronic kidney disease, stage 3b: Secondary | ICD-10-CM | POA: Diagnosis not present

## 2023-08-03 DIAGNOSIS — E538 Deficiency of other specified B group vitamins: Secondary | ICD-10-CM

## 2023-08-03 DIAGNOSIS — J449 Chronic obstructive pulmonary disease, unspecified: Secondary | ICD-10-CM

## 2023-08-03 DIAGNOSIS — D631 Anemia in chronic kidney disease: Secondary | ICD-10-CM

## 2023-08-03 DIAGNOSIS — R6889 Other general symptoms and signs: Secondary | ICD-10-CM

## 2023-08-03 NOTE — Assessment & Plan Note (Signed)
Most recent A1c on record was 4.9% on 12/31/2022.  Recorded glucoses ranged from a low of 73 up to high of 154.  Discordance may be present in the context of CKD.  Update A1c at next blood draw.

## 2023-08-03 NOTE — Assessment & Plan Note (Signed)
Current B12 1068 on 500 mcg every other day. No change indicated.

## 2023-08-03 NOTE — Assessment & Plan Note (Addendum)
CKD is stage IIIb with current creatinine 1.39 and GFR 39.  Creatinine is ranged from 1.26 up to 1.44 and GFR from 37 up to 43.  Med list reviewed; no indication for change in meds or dosage at this time. Current H/H is 9.9/31; range has been 8.7/27.8 up to 11.1/36.1.   Iron low normal @ 30. Current B12 1068 on 500 mcg every other day.

## 2023-08-03 NOTE — Patient Instructions (Signed)
See assessment and plan under each diagnosis in the problem list and acutely for this visit 

## 2023-08-03 NOTE — Assessment & Plan Note (Addendum)
Your recent [place] nap with temperatures in the low teens has exacerbated her cold intolerance.  Symptoms persist despite the ambient temperature being turned up and supplemental layers of clothing.  TSH is therapeutic.  Monitor for any signs of digit vasospasm; consider a trial of vasodilating calcium channel blocker for such to occur.

## 2023-08-03 NOTE — Progress Notes (Unsigned)
NURSING HOME LOCATION:  [facility] ROOM NUMBER:  150 P  CODE STATUS:  DNR  PCP:  Synthia Innocent NP  This is a nursing facility follow up visit of chronic medical diagnoses & to document compliance with Regulation 483.30 (c) in The Long Term Care Survey Manual Phase 2 which mandates caregiver visit ( visits can alternate among physician, PA or NP as per statutes) within 10 days of 30 days / 60 days/ 90 days post admission to SNF date    Interim medical record and care since last SNF visit was updated with review of diagnostic studies and change in clinical status since last visit were documented.  HPI: She is a permanent resident of this facility with medical diagnoses of history of chronic anemia, history of asthma, COPD, diabetes with peripheral neuropathy and CKD, GERD, glaucoma, dyslipidemia, essential hypertension, history of stroke, and history of osteoporosis.  Surgeries and procedures include multiple colonoscopies, EGD, right breast cystectomy, tubal ligation, and cholecystectomy.  Labs are current; she has stable slight hyponatremia with most recent value 134.  Recorded glucoses range from 73 up to 154.  Current creatinine is 1.39 with GFR 39 indicating CKD stage IIIb.  Creatinine has ranged from a low of 1.26 up to a high of 1.44.  The GFR has ranged from a nadir value of 37 up to a high of 43, indicating chronic CKD stage IIIb.  On 12/6 iron levels were low normal at 30 with normal range of 28-170.  Creatinine was elevated at 355.  The current H/H is 9.9/31.  The H/H has ranged from a low of 8.7/27.8 up to high of 11.1/36.1.  She has a history of B12 deficiency.The most recent B12 level was 1068; the range has been from a low of 459 which is normal up to high of 1756.  The most recent A1c was prediabetic at 4.9% on 12/31/2022.  Review of systems: She states that she is "doing good for an old lady."  At this time her major complaint is exacerbation of her chronic cold  intolerance due to recent cold snap with temperatures in the low teens.  Oncology follow-up of chronic anemia was originally scheduled 07/16/2023 but has been changed to 1/31 as she had active COVID at that time. She states that she has fully recovered from the COVID as the mild cough and fatigue have resolved.  She denies any symptoms or signs of long hauler syndrome.  Her peripheral neuropathy symptoms are unchanged.  Constitutional: No fever, significant weight change, fatigue  Eyes: No redness, discharge, pain, vision change ENT/mouth: No nasal congestion,  purulent discharge, earache, change in hearing, sore throat  Cardiovascular: No chest pain, palpitations, paroxysmal nocturnal dyspnea, edema  Respiratory: No cough, sputum production, hemoptysis, DOE, significant snoring, apnea   Gastrointestinal: No heartburn, dysphagia, abdominal pain, nausea /vomiting, rectal bleeding, melena, change in bowels Genitourinary: No dysuria, hematuria, pyuria, incontinence, nocturia Musculoskeletal: No joint stiffness, joint swelling, weakness, pain Dermatologic: No rash, pruritus, change in appearance of skin Neurologic: No dizziness, headache, syncope, seizures Psychiatric: No significant anxiety, depression, insomnia, anorexia Endocrine: No change in hair/skin/nails, excessive thirst, excessive hunger, excessive urination  Hematologic/lymphatic: No significant bruising, lymphadenopathy, abnormal bleeding Allergy/immunology: No itchy/watery eyes, significant sneezing, urticaria, angioedema  Physical exam:  Pertinent or positive findings: As is always the case she is wearing multiple layers of clothing as well as wool mittens.  There is some asymmetric facial hyperpigmentation especially around the eyes and nose.  Arcus senilis is  present.  She is completely edentulous.  Heart rate is slow; rhythm is regular.  Breath sounds are decreased; she has low-grade expiratory rhonchi. Pedal pulses are not palpable.  Limb atrophy present.She tends to hyperextend her fingers.  General appearance: no acute distress, increased work of breathing is present.   Lymphatic: No lymphadenopathy about the head, neck, axilla. Eyes: No conjunctival inflammation or lid edema is present. There is no scleral icterus. Ears:  External ear exam shows no significant lesions or deformities.   Nose:  External nasal examination shows no deformity or inflammation. Nasal mucosa are pink and moist without lesions, exudates Neck:  No thyromegaly, masses, tenderness noted.    Heart:  No gallop, murmur, click, rub .  Lungs:  without wheezes, rales, rubs. Abdomen: Bowel sounds are normal. Abdomen is soft and nontender with no organomegaly, hernias, masses. GU: Deferred  Extremities:  No cyanosis, clubbing, edema  Neurologic exam :Balance, Rhomberg, finger to nose testing could not be completed due to clinical state Skin: Warm & dry w/o tenting. No significant rash.  See summary under each active problem in the Problem List with associated updated therapeutic plan

## 2023-08-05 NOTE — Assessment & Plan Note (Addendum)
She denies any long hauler COVID related respiratory symptoms. Low grade rhonchi present ,but O2 sats excellent. Albuterol MDI if signs progress.

## 2023-08-06 ENCOUNTER — Encounter: Payer: Self-pay | Admitting: Adult Health

## 2023-08-06 ENCOUNTER — Non-Acute Institutional Stay (SKILLED_NURSING_FACILITY): Payer: Medicare Other | Admitting: Adult Health

## 2023-08-06 DIAGNOSIS — G959 Disease of spinal cord, unspecified: Secondary | ICD-10-CM | POA: Diagnosis not present

## 2023-08-06 DIAGNOSIS — J449 Chronic obstructive pulmonary disease, unspecified: Secondary | ICD-10-CM

## 2023-08-06 DIAGNOSIS — I700 Atherosclerosis of aorta: Secondary | ICD-10-CM | POA: Diagnosis not present

## 2023-08-06 NOTE — Progress Notes (Signed)
Location:  [facility] Room Number: 148 Place of Service:  SNF (31)   CODE STATUS: dnr   Allergies  Allergen Reactions   Latex Rash   Levofloxacin Nausea And Vomiting   Neomycin-Bacitracin-Polymyxin  [Bacitracin-Neomycin-Polymyxin] Rash   Other Swelling and Rash    TOMATO  SOUP-PER MAR   Prednisone Hives, Itching, Rash and Other (See Comments)   Fish Allergy     rash   Shellfish Allergy Hives   Neosporin [Neomycin-Polymyxin-Gramicidin] Itching and Rash    Chief Complaint  Patient presents with   Acute Visit    Care plan meeting     HPI:  We have come together for her care plan. BIMS15/15 mood 0/30. Is wheelchair bound has had one fall without injury. She requires moderate to dependent assist with her adl care. She is incontinent of bladder and bowel. Dietary: setup for meals; regular diet weight is 124.4 pounds. Therapy: none at this time. She will continue to be followed for her chronic illnesses including:  Aortic atherosclerosis   Chronic obstructive pulmonary disease unspecified COPD type  Cervical myelopathy   Past Medical History:  Diagnosis Date   Allergy    Anemia    pernicious anemia   Arthritis    Asthma    Blood transfusion without reported diagnosis    CKD (chronic kidney disease)    COPD (chronic obstructive pulmonary disease) (HCC) 12/23/2019   Diabetic peripheral neuropathy associated with type 2 diabetes mellitus (HCC) 04/07/2021   DM (diabetes mellitus), type 2 with peripheral vascular complications (HCC) 12/23/2019   Gastritis    GERD (gastroesophageal reflux disease)    Glaucoma    History of blood in urine    Hyperlipidemia    Hypertension    Neuropathy    both feet   Ocular hypertension    Osteoporosis    Stroke (HCC)    Vitamin B12 deficiency anemia due to intrinsic factor deficiency     Past Surgical History:  Procedure Laterality Date   CATARACT EXTRACTION W/PHACO Right 11/27/2022   Procedure: CATARACT  EXTRACTION PHACO AND INTRAOCULAR LENS PLACEMENT (IOC);  Surgeon: [name], MD;  Location: [facility];  Service: Ophthalmology;  Laterality: Right;  CDE: 15.67   CATARACT EXTRACTION W/PHACO Left 12/18/2022   Procedure: CATARACT EXTRACTION PHACO AND INTRAOCULAR LENS PLACEMENT (IOC);  Surgeon: [name], MD;  Location: [facility];  Service: Ophthalmology;  Laterality: Left;  CDE: 11.78   CHOLECYSTECTOMY  2008   COLONOSCOPY  04/30/2011   Procedure: COLONOSCOPY;  Surgeon: Malissa Hippo, MD;  Location: [facility];  Service: Endoscopy;  Laterality: N/A;   COLONOSCOPY  05/25/2012   Procedure: COLONOSCOPY;  Surgeon: Malissa Hippo, MD;  Location: [facility];  Service: Endoscopy;  Laterality: N/A;  1:25-changed to 1200 Ann to notify pt   COLONOSCOPY Bilateral 12/2017   COLONOSCOPY WITH PROPOFOL N/A 11/29/2020   Procedure: COLONOSCOPY WITH PROPOFOL;  Surgeon: Dolores Frame, MD;  Location: [facility];  Service: Gastroenterology;  Laterality: N/A;  1:35 patient is in the [facility]   ESOPHAGOGASTRODUODENOSCOPY N/A 12/07/2019   Procedure: ESOPHAGOGASTRODUODENOSCOPY (EGD);  Surgeon: Malissa Hippo, MD;  Location: [facility];  Service: Endoscopy;  Laterality: N/A;  155   GALLBLADDER SURGERY     right breast cystectomy  1988   TUBAL LIGATION  1975    Social History   Socioeconomic History   Marital status: Divorced    Spouse name: Not on file   Number of children:  4   Years of education: 9   Highest education level: Not on file  Occupational History   Occupation: retired   Tobacco Use   Smoking status: Former    Current packs/day: 0.00    Average packs/day: 1 pack/day for 40.0 years (40.0 ttl pk-yrs)    Types: Cigarettes    Start date: 04/03/1971    Quit date: 04/03/2011    Years since quitting: 12.3   Smokeless tobacco: Never  Vaping Use   Vaping status: Never Used  Substance and Sexual Activity   Alcohol use: No   Drug use: No   Sexual activity: Not  Currently  Other Topics Concern   Not on file  Social History Narrative   01/[birth year] lives at [facility], [place]   Social Drivers of Health   Financial Resource Strain: Not on file  Food Insecurity: Not on file  Transportation Needs: Not on file  Physical Activity: Not on file  Stress: Not on file  Social Connections: Not on file  Intimate Partner Violence: Not on file   Family History  Problem Relation Age of Onset   Diabetes Mother    Hyperlipidemia Mother    Hypertension Mother    Heart disease Father    Hyperlipidemia Father    Diabetes Sister    Hyperlipidemia Sister    Hypertension Sister    Cancer Brother    Heart disease Brother    Hyperlipidemia Brother    Hypertension Brother    Diabetes Sister       VITAL SIGNS BP (!) 133/59   Pulse 69   Temp (!) 97.3 F (36.3 C)   Resp 18   Ht 5\' 5"  (1.651 m)   Wt 124 lb 6.4 oz (56.4 kg)   SpO2 95%   BMI 20.70 kg/m   Outpatient Encounter Medications as of 08/06/2023  Medication Sig   aspirin EC 81 MG tablet Take 81 mg by mouth daily. Swallow whole.   brimonidine-timolol (COMBIGAN) 0.2-0.5 % ophthalmic solution Place 1 drop into both eyes every 12 (twelve) hours.   Calcium Carb-Cholecalciferol (CALCIUM 500 + D) 500-5 MG-MCG TABS Take 1 tablet by mouth daily.   cyanocobalamin (V-R VITAMIN B-12) 500 MCG tablet Take 1 tablet (500 mcg total) by mouth every other day.   fluticasone (FLONASE) 50 MCG/ACT nasal spray Place 2 sprays into both nostrils daily.   folic acid (V-R FOLIC ACID) 400 MCG tablet Take 1 tablet (400 mcg total) by mouth every other day.   gabapentin (NEURONTIN) 100 MG capsule Take 200 mg by mouth at bedtime. (2100)   gabapentin (NEURONTIN) 100 MG capsule Take 100 mg by mouth daily.   losartan (COZAAR) 25 MG tablet Take 25 mg by mouth daily.   NON FORMULARY Diet:Regular thin liquids  ALLERGIC TO FISH   omeprazole (PRILOSEC) 40 MG capsule Take 40 mg by mouth daily at 6 (six) AM. (0600)   rosuvastatin  (CRESTOR) 20 MG tablet Take 20 mg by mouth daily.   senna-docusate (SENOKOT S) 8.6-50 MG tablet Take 1 tablet by mouth 2 (two) times daily.   sodium chloride 1 g tablet Take 1 g by mouth 2 (two) times daily with a meal.   No facility-administered encounter medications on file as of 08/06/2023.     SIGNIFICANT DIAGNOSTIC EXAMS  05-24-20: dexa: t score -2.387  NO NEW EXAMS.     LABS REVIEWED PREVIOUS     09-10-22: wbc 6.5; hgb 10.5; hct 33.0; mcv 93.0 plt 181; glucose 113; bun  19; creat 1.27; k+ 4.6; na++ 133; ca 9.2; gfr 43; protein 7.4 albumin 4.1; vitamin B 12: 1756; folate >40; iron 63 tibc 191 10-01-22: chol 137; ldl 80; trig 53; hdl 46 11-02-22: wbc 6.3; hgb 9.1; hct 28.8; mcv 94.1 plt 136; glucose 78; bun 19; creat 1.44; k+ 3.9; na++ 135; ca 8.7; gfr 37; protein 6.0 albumin 3.3 11-05-22: ACR 0.17 11-06-22: PTH 25 ca 9.4 11-11-22; folate 32.8 vitamin B12: 459; iron 30; tibc 172; ferritin 452; serotonin 78 12-31-22: wbc 5.4; hgb 10.9; hct 34.9; mcv 93.8 plt 167; hgb A1c 4.9; vitamin B12: 829; folate 39.3; vitamin D 59.03 01-27-23: wbc 7.2; hgb 11.3; hct 36.1; mcv 91.6 plt 202 03-24-23: wbc 6.2; hgb 9.8; hct 31.5; mcv 93.5; plt 235; glucose 131; bun 18; creat 1.44; k+ 4.7; na++ 134; ca 8.4; gfr 37; protein 6.9; albumin 3.7  vitamin B12: 1068; iron 33; tibc 173 04-23-23: wbc 7.4; hgb 10.4; hct 34.0; mcv 95.8 plt 143 05-20-23: tsh 2.925 05-21-23: wbc 5.7; hgb 10.4; hct 34.0; mcv 95.2 plt 149    06-18-23: wbc 6.8; hgb 11.1; hct 36.1; mcv 95.3 plt 192; glucose 154; bun 18; creat 1.36; k+ 5.1; na++ 134; ca 9.3; gfr 40 protein 7.4; albumin 4.1  NO NEW LABS.   Review of Systems  Constitutional:  Negative for malaise/fatigue.  Respiratory:  Negative for cough and shortness of breath.   Cardiovascular:  Negative for chest pain, palpitations and leg swelling.  Gastrointestinal:  Negative for abdominal pain, constipation and heartburn.  Musculoskeletal:  Negative for back pain, joint pain and  myalgias.  Skin: Negative.   Neurological:  Negative for dizziness.  Psychiatric/Behavioral:  The patient is not nervous/anxious.    Physical Exam Constitutional:      General: She is not in acute distress.    Appearance: She is well-developed. She is not diaphoretic.  Neck:     Thyroid: No thyromegaly.  Cardiovascular:     Rate and Rhythm: Normal rate and regular rhythm.     Pulses: Normal pulses.     Heart sounds: Normal heart sounds.  Pulmonary:     Effort: Pulmonary effort is normal. No respiratory distress.     Breath sounds: Normal breath sounds.  Abdominal:     General: Bowel sounds are normal. There is no distension.     Palpations: Abdomen is soft.     Tenderness: There is no abdominal tenderness.  Musculoskeletal:        General: Normal range of motion.     Cervical back: Neck supple.     Right lower leg: No edema.     Left lower leg: No edema.  Lymphadenopathy:     Cervical: No cervical adenopathy.  Skin:    General: Skin is warm and dry.  Neurological:     Mental Status: She is alert and oriented to person, place, and time.  Psychiatric:        Mood and Affect: Mood normal.      ASSESSMENT/ PLAN:  TODAY  Aortic atherosclerosis Chronic obstructive pulmonary disease unspecified COPD type Cervical myelopathy   Will continue current medications Will continue current plan of care Will continue to monitor her status.   Time spent with patient: 40 minutes: medications; dietary plan of care.    Synthia Innocent NP [facility]   call [phone]

## 2023-08-13 ENCOUNTER — Inpatient Hospital Stay: Payer: Medicare Other

## 2023-08-13 ENCOUNTER — Inpatient Hospital Stay: Payer: Medicare Other | Attending: Hematology

## 2023-08-13 VITALS — BP 167/76 | HR 70 | Resp 18

## 2023-08-13 DIAGNOSIS — N1832 Chronic kidney disease, stage 3b: Secondary | ICD-10-CM | POA: Diagnosis not present

## 2023-08-13 DIAGNOSIS — D631 Anemia in chronic kidney disease: Secondary | ICD-10-CM | POA: Diagnosis not present

## 2023-08-13 DIAGNOSIS — E538 Deficiency of other specified B group vitamins: Secondary | ICD-10-CM

## 2023-08-13 LAB — CBC
HCT: 33.6 % — ABNORMAL LOW (ref 36.0–46.0)
Hemoglobin: 10.2 g/dL — ABNORMAL LOW (ref 12.0–15.0)
MCH: 28.4 pg (ref 26.0–34.0)
MCHC: 30.4 g/dL (ref 30.0–36.0)
MCV: 93.6 fL (ref 80.0–100.0)
Platelets: 141 10*3/uL — ABNORMAL LOW (ref 150–400)
RBC: 3.59 MIL/uL — ABNORMAL LOW (ref 3.87–5.11)
RDW: 14.9 % (ref 11.5–15.5)
WBC: 6.7 10*3/uL (ref 4.0–10.5)
nRBC: 0 % (ref 0.0–0.2)

## 2023-08-13 MED ORDER — EPOETIN ALFA-EPBX 10000 UNIT/ML IJ SOLN
10000.0000 [IU] | Freq: Once | INTRAMUSCULAR | Status: AC
Start: 2023-08-13 — End: 2023-08-13
  Administered 2023-08-13: 10000 [IU] via SUBCUTANEOUS
  Filled 2023-08-13: qty 1

## 2023-08-13 NOTE — Patient Instructions (Signed)
 [facility] [place] - A DEPT OF [facility]  Discharge Instructions: Thank you for choosing [facility] to provide your oncology and hematology care.  If you have a lab appointment with the Cancer Center - please note that after April 8th, 2024, all labs will be drawn in the cancer center.  You do not have to check in or register with the main entrance as you have in the past but will complete your check-in in the cancer center.  Wear comfortable clothing and clothing appropriate for easy access to any Portacath or PICC line.   We strive to give you quality time with your provider. You may need to reschedule your appointment if you arrive late (15 or more minutes).  Arriving late affects you and other patients whose appointments are after yours.  Also, if you miss three or more appointments without notifying the office, you may be dismissed from the clinic at the provider's discretion.      For prescription refill requests, have your pharmacy contact our office and allow 72 hours for refills to be completed.    Today you received the following chemotherapy and/or immunotherapy agents Retacrit      To help prevent nausea and vomiting after your treatment, we encourage you to take your nausea medication as directed.  BELOW ARE SYMPTOMS THAT SHOULD BE REPORTED IMMEDIATELY: *FEVER GREATER THAN 100.4 F (38 C) OR HIGHER *CHILLS OR SWEATING *NAUSEA AND VOMITING THAT IS NOT CONTROLLED WITH YOUR NAUSEA MEDICATION *UNUSUAL SHORTNESS OF BREATH *UNUSUAL BRUISING OR BLEEDING *URINARY PROBLEMS (pain or burning when urinating, or frequent urination) *BOWEL PROBLEMS (unusual diarrhea, constipation, pain near the anus) TENDERNESS IN MOUTH AND THROAT WITH OR WITHOUT PRESENCE OF ULCERS (sore throat, sores in mouth, or a toothache) UNUSUAL RASH, SWELLING OR PAIN  UNUSUAL VAGINAL DISCHARGE OR ITCHING   Items with * indicate a potential emergency and should be followed up  as soon as possible or go to the Emergency Department if any problems should occur.  Please show the CHEMOTHERAPY ALERT CARD or IMMUNOTHERAPY ALERT CARD at check-in to the Emergency Department and triage nurse.  Should you have questions after your visit or need to cancel or reschedule your appointment, please contact [facility] [place] - A DEPT OF [facility] [phone]  and follow the prompts.  Office hours are 8:00 a.m. to 4:30 p.m. Monday - Friday. Please note that voicemails left after 4:00 p.m. may not be returned until the following business day.  We are closed weekends and major holidays. You have access to a nurse at all times for urgent questions. Please call the main number to the clinic [phone] and follow the prompts.  For any non-urgent questions, you may also contact your provider using MyChart. We now offer e-Visits for anyone 31 and older to request care online for non-urgent symptoms. For details visit [email or web address].   Also download the MyChart app! Go to the app store, search "MyChart", open the app, select [place], and log in with your MyChart username and password.

## 2023-08-13 NOTE — Progress Notes (Signed)
Traci Parker presents today for Retacrit injection per the provider's orders.  Stable during administration without incident; injection site WNL; see MAR for injection details.  Patient tolerated procedure well and without incident.  No questions or complaints noted at this time.

## 2023-08-27 ENCOUNTER — Ambulatory Visit: Payer: Medicare Other

## 2023-08-27 ENCOUNTER — Other Ambulatory Visit: Payer: Medicare Other

## 2023-08-30 ENCOUNTER — Encounter: Payer: Self-pay | Admitting: Adult Health

## 2023-08-30 ENCOUNTER — Non-Acute Institutional Stay (SKILLED_NURSING_FACILITY): Payer: Medicare Other | Admitting: Adult Health

## 2023-08-30 DIAGNOSIS — Z86718 Personal history of other venous thrombosis and embolism: Secondary | ICD-10-CM | POA: Diagnosis not present

## 2023-08-30 DIAGNOSIS — I700 Atherosclerosis of aorta: Secondary | ICD-10-CM | POA: Diagnosis not present

## 2023-08-30 DIAGNOSIS — R627 Adult failure to thrive: Secondary | ICD-10-CM

## 2023-08-30 NOTE — Progress Notes (Signed)
 Location:  [facility] Room Number: 150 Place of Service:  SNF (31)   CODE STATUS: dnr   Allergies  Allergen Reactions   Latex Rash   Levofloxacin Nausea And Vomiting   Neomycin-Bacitracin-Polymyxin  [Bacitracin-Neomycin-Polymyxin] Rash   Other Swelling and Rash    TOMATO  SOUP-PER MAR   Prednisone Hives, Itching, Rash and Other (See Comments)   Fish Allergy     rash   Shellfish Allergy Hives   Neosporin [Neomycin-Polymyxin-Gramicidin] Itching and Rash    Chief Complaint  Patient presents with   Medical Management of Chronic Issues        Failure to thrive in adult:  Aortic atherosclerosis  History of dvt left lower extremity     HPI:  She is a 78 year old long term resident of this facility being seen for the management of her chronic illnesses: Failure to thrive in adult:  Aortic atherosclerosis  History of dvt left lower extremity. There are no reports of uncontrolled pain. She has a good appetite and has gained weight. There are no reports of anxiety or depressive thoughts.   Past Medical History:  Diagnosis Date   Allergy    Anemia    pernicious anemia   Arthritis    Asthma    Blood transfusion without reported diagnosis    CKD (chronic kidney disease)    COPD (chronic obstructive pulmonary disease) (HCC) 12/23/2019   Diabetic peripheral neuropathy associated with type 2 diabetes mellitus (HCC) 04/07/2021   DM (diabetes mellitus), type 2 with peripheral vascular complications (HCC) 12/23/2019   Gastritis    GERD (gastroesophageal reflux disease)    Glaucoma    History of blood in urine    Hyperlipidemia    Hypertension    Neuropathy    both feet   Ocular hypertension    Osteoporosis    Stroke (HCC)    Vitamin B12 deficiency anemia due to intrinsic factor deficiency     Past Surgical History:  Procedure Laterality Date   CATARACT EXTRACTION W/PHACO Right 11/27/2022   Procedure: CATARACT EXTRACTION PHACO AND INTRAOCULAR LENS  PLACEMENT (IOC);  Surgeon: [name], MD;  Location: [facility];  Service: Ophthalmology;  Laterality: Right;  CDE: 15.67   CATARACT EXTRACTION W/PHACO Left 12/18/2022   Procedure: CATARACT EXTRACTION PHACO AND INTRAOCULAR LENS PLACEMENT (IOC);  Surgeon: [name], MD;  Location: [facility];  Service: Ophthalmology;  Laterality: Left;  CDE: 11.78   CHOLECYSTECTOMY  2008   COLONOSCOPY  04/30/2011   Procedure: COLONOSCOPY;  Surgeon: Malissa Hippo, MD;  [facility]: [facility];  Service: Endoscopy;  Laterality: N/A;   COLONOSCOPY  05/25/2012   Procedure: COLONOSCOPY;  Surgeon: Malissa Hippo, MD;  [facility]: [facility];  Service: Endoscopy;  Laterality: N/A;  1:25-changed to 1200 Ann to notify pt   COLONOSCOPY Bilateral 12/2017   COLONOSCOPY WITH PROPOFOL N/A 11/29/2020   Procedure: COLONOSCOPY WITH PROPOFOL;  Surgeon: Dolores Frame, MD;  [facility]: [facility];  Service: Gastroenterology;  Laterality: N/A;  1:35 patient is in the [facility]   ESOPHAGOGASTRODUODENOSCOPY N/A 12/07/2019   Procedure: ESOPHAGOGASTRODUODENOSCOPY (EGD);  Surgeon: Malissa Hippo, MD;  [facility]: [facility];  Service: Endoscopy;  Laterality: N/A;  155   GALLBLADDER SURGERY     right breast cystectomy  1988   TUBAL LIGATION  1975    Social History   Socioeconomic History   Marital status: Divorced    Spouse name: Not on file   Number  of children: 4   Years of education: 9   Highest education level: Not on file  Occupational History   Occupation: retired   Tobacco Use   Smoking status: Former    Current packs/day: 0.00    Average packs/day: 1 pack/day for 40.0 years (40.0 ttl pk-yrs)    Types: Cigarettes    Start date: 04/03/1971    Quit date: 04/03/2011    Years since quitting: 12.4   Smokeless tobacco: Never  Vaping Use   Vaping status: Never Used  Substance and Sexual Activity   Alcohol use: No   Drug use: No   Sexual activity: Not Currently  Other Topics Concern   Not on  file  Social History Narrative   01/17/20 lives at [facility], [place]   Social Drivers of Health   Financial Resource Strain: Not on file  Food Insecurity: Not on file  Transportation Needs: Not on file  Physical Activity: Not on file  Stress: Not on file  Social Connections: Not on file  Intimate Partner Violence: Not on file   Family History  Problem Relation Age of Onset   Diabetes Mother    Hyperlipidemia Mother    Hypertension Mother    Heart disease Father    Hyperlipidemia Father    Diabetes Sister    Hyperlipidemia Sister    Hypertension Sister    Cancer Brother    Heart disease Brother    Hyperlipidemia Brother    Hypertension Brother    Diabetes Sister       VITAL SIGNS BP 121/69   Pulse 67   Temp 97.7 F (36.5 C)   Resp 17   Ht 5\' 5"  (1.651 m)   Wt 129 lb (58.5 kg)   SpO2 96%   BMI 21.47 kg/m   Outpatient Encounter Medications as of 08/30/2023  Medication Sig   aspirin EC 81 MG tablet Take 81 mg by mouth daily. Swallow whole.   brimonidine-timolol (COMBIGAN) 0.2-0.5 % ophthalmic solution Place 1 drop into both eyes every 12 (twelve) hours.   Calcium Carb-Cholecalciferol (CALCIUM 500 + D) 500-5 MG-MCG TABS Take 1 tablet by mouth daily.   cyanocobalamin (V-R VITAMIN B-12) 500 MCG tablet Take 1 tablet (500 mcg total) by mouth every other day.   fluticasone (FLONASE) 50 MCG/ACT nasal spray Place 2 sprays into both nostrils daily.   folic acid (V-R FOLIC ACID) 400 MCG tablet Take 1 tablet (400 mcg total) by mouth every other day.   gabapentin (NEURONTIN) 100 MG capsule Take 200 mg by mouth at bedtime. (2100)   gabapentin (NEURONTIN) 100 MG capsule Take 100 mg by mouth daily.   losartan (COZAAR) 25 MG tablet Take 25 mg by mouth daily.   NON FORMULARY Diet:Regular thin liquids  ALLERGIC TO FISH   omeprazole (PRILOSEC) 40 MG capsule Take 40 mg by mouth daily at 6 (six) AM. (0600)   rosuvastatin (CRESTOR) 20 MG tablet Take 20 mg by mouth daily.    senna-docusate (SENOKOT S) 8.6-50 MG tablet Take 1 tablet by mouth 2 (two) times daily.   sodium chloride 1 g tablet Take 1 g by mouth 2 (two) times daily with a meal.   No facility-administered encounter medications on file as of 08/30/2023.     SIGNIFICANT DIAGNOSTIC EXAMS  05-24-20: dexa: t score -2.387  NO NEW EXAMS.     LABS REVIEWED PREVIOUS     09-10-22: wbc 6.5; hgb 10.5; hct 33.0; mcv 93.0 plt 181; glucose 113; bun 19; creat  1.27; k+ 4.6; na++ 133; ca 9.2; gfr 43; protein 7.4 albumin 4.1; vitamin B 12: 1756; folate >40; iron 63 tibc 191 10-01-22: chol 137; ldl 80; trig 53; hdl 46 11-02-22: wbc 6.3; hgb 9.1; hct 28.8; mcv 94.1 plt 136; glucose 78; bun 19; creat 1.44; k+ 3.9; na++ 135; ca 8.7; gfr 37; protein 6.0 albumin 3.3 11-05-22: ACR 0.17 11-06-22: PTH 25 ca 9.4 11-11-22; folate 32.8 vitamin B12: 459; iron 30; tibc 172; ferritin 452; serotonin 78 12-31-22: wbc 5.4; hgb 10.9; hct 34.9; mcv 93.8 plt 167; hgb A1c 4.9; vitamin B12: 829; folate 39.3; vitamin D 59.03 01-27-23: wbc 7.2; hgb 11.3; hct 36.1; mcv 91.6 plt 202 03-24-23: wbc 6.2; hgb 9.8; hct 31.5; mcv 93.5; plt 235; glucose 131; bun 18; creat 1.44; k+ 4.7; na++ 134; ca 8.4; gfr 37; protein 6.9; albumin 3.7  vitamin B12: 1068; iron 33; tibc 173 04-23-23: wbc 7.4; hgb 10.4; hct 34.0; mcv 95.8 plt 143 05-20-23: tsh 2.925 05-21-23: wbc 5.7; hgb 10.4; hct 34.0; mcv 95.2 plt 149    06-18-23: wbc 6.8; hgb 11.1; hct 36.1; mcv 95.3 plt 192; glucose 154; bun 18; creat 1.36; k+ 5.1; na++ 134; ca 9.3; gfr 40 protein 7.4; albumin 4.1  TODAY  07-14-23: wbc 4.8; hgb 9.9; hct 31.0; mcv 92.0 plt 144; glucose 117; bun 18; creat 1.39; k+ 4.6; na++ 134; ca 9.0; gfr 39; d-dimer 1.18; CRP 0.8 08-13-23: wbc 6.7; hgb 10.2; hct 33.6; mcv 93.6 plt 141   Review of Systems  Constitutional:  Negative for malaise/fatigue.  Respiratory:  Negative for cough and shortness of breath.   Cardiovascular:  Negative for chest pain, palpitations and leg swelling.   Gastrointestinal:  Negative for abdominal pain, constipation and heartburn.  Musculoskeletal:  Negative for back pain, joint pain and myalgias.  Skin: Negative.   Neurological:  Negative for dizziness.  Psychiatric/Behavioral:  The patient is not nervous/anxious.    Physical Exam Constitutional:      General: She is not in acute distress.    Appearance: She is well-developed. She is not diaphoretic.  Neck:     Thyroid: No thyromegaly.  Cardiovascular:     Rate and Rhythm: Normal rate and regular rhythm.     Pulses: Normal pulses.     Heart sounds: Normal heart sounds.  Pulmonary:     Effort: Pulmonary effort is normal. No respiratory distress.     Breath sounds: Normal breath sounds.  Abdominal:     General: Bowel sounds are normal. There is no distension.     Palpations: Abdomen is soft.     Tenderness: There is no abdominal tenderness.  Musculoskeletal:        General: Normal range of motion.     Cervical back: Neck supple.     Right lower leg: No edema.     Left lower leg: No edema.  Lymphadenopathy:     Cervical: No cervical adenopathy.  Skin:    General: Skin is warm and dry.  Neurological:     Mental Status: She is alert and oriented to person, place, and time.  Psychiatric:        Mood and Affect: Mood normal.     ASSESSMENT/ PLAN:  TODAY   Failure to thrive in adult: is doing well weight is 129 pounds; will continue to monitor   2. Aortic atherosclerosis (ct 12-23-19) will monitor   3. 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 Chronic obstructive pulmonary disease unspecified COPD type: will monitor   5. Hypertension associated with stage 3 chronic kidney disease due to type 2 diabetes mellitus: b/p 121/69 will continue cozaar 25 mg daily   6. CKD stage 3 due to type 2 diabetes mellitus: bun 18; creat 1.39 gfr 39  7. Diabetic peripheral neuropathy associated with type 2 diabetes  mellitus: is on gabapentin 100 mg in the AM and 200 mg at hs   8. Type 2 diabetes mellitus with stage 3 chronic kidney disease without long term current use of insulin: hgb A1c 5.3; is on asa 81 mg daily statin and arb   9. Hyperlipidemia associated with type 2 diabetes mellitus: ldl 63 is on crestor 10 mg daily   10. Anemia due to chronic renal failure with erythropoietin stage 3: hgb 10.2; will continue to monitor  is followed by oncology   11. Other chronic gastritis without hemorrhage: will continue prilosec 40 mg daily   12. Increased intraocular pressure bilateral: will continue truspot to both eyes twice daily   13. Vitamin B 12 deficiency: level 1068; folate 32.8   14. Vitamin D deficiency: level is 41.14; on 1000 units daily   15. Thyroiditis: tsh 2.925; will monitor   16. Cervical myelopathy: will monitor is without change   17. Thrombocytopenia: plt 141 will monitor    Synthia Innocent NP [facility]  call [phone]

## 2023-09-13 DIAGNOSIS — H1045 Other chronic allergic conjunctivitis: Secondary | ICD-10-CM | POA: Diagnosis not present

## 2023-09-13 DIAGNOSIS — H401132 Primary open-angle glaucoma, bilateral, moderate stage: Secondary | ICD-10-CM | POA: Diagnosis not present

## 2023-09-13 DIAGNOSIS — Z961 Presence of intraocular lens: Secondary | ICD-10-CM | POA: Diagnosis not present

## 2023-09-15 ENCOUNTER — Other Ambulatory Visit: Payer: Self-pay

## 2023-09-15 DIAGNOSIS — E538 Deficiency of other specified B group vitamins: Secondary | ICD-10-CM

## 2023-09-15 DIAGNOSIS — D631 Anemia in chronic kidney disease: Secondary | ICD-10-CM

## 2023-09-16 ENCOUNTER — Inpatient Hospital Stay: Payer: Medicare Other | Attending: Hematology

## 2023-09-16 ENCOUNTER — Inpatient Hospital Stay: Payer: Medicare Other | Admitting: Oncology

## 2023-09-16 ENCOUNTER — Inpatient Hospital Stay: Payer: Medicare Other

## 2023-09-16 VITALS — BP 163/82 | HR 80 | Temp 98.6°F | Resp 18 | Wt 131.4 lb

## 2023-09-16 DIAGNOSIS — N183 Chronic kidney disease, stage 3 unspecified: Secondary | ICD-10-CM | POA: Diagnosis not present

## 2023-09-16 DIAGNOSIS — E538 Deficiency of other specified B group vitamins: Secondary | ICD-10-CM | POA: Diagnosis not present

## 2023-09-16 DIAGNOSIS — N1832 Chronic kidney disease, stage 3b: Secondary | ICD-10-CM | POA: Diagnosis not present

## 2023-09-16 DIAGNOSIS — D631 Anemia in chronic kidney disease: Secondary | ICD-10-CM | POA: Insufficient documentation

## 2023-09-16 LAB — CBC WITH DIFFERENTIAL/PLATELET
Abs Immature Granulocytes: 0.02 10*3/uL (ref 0.00–0.07)
Basophils Absolute: 0 10*3/uL (ref 0.0–0.1)
Basophils Relative: 1 %
Eosinophils Absolute: 0.7 10*3/uL — ABNORMAL HIGH (ref 0.0–0.5)
Eosinophils Relative: 12 %
HCT: 33.1 % — ABNORMAL LOW (ref 36.0–46.0)
Hemoglobin: 10 g/dL — ABNORMAL LOW (ref 12.0–15.0)
Immature Granulocytes: 0 %
Lymphocytes Relative: 16 %
Lymphs Abs: 1 10*3/uL (ref 0.7–4.0)
MCH: 28.5 pg (ref 26.0–34.0)
MCHC: 30.2 g/dL (ref 30.0–36.0)
MCV: 94.3 fL (ref 80.0–100.0)
Monocytes Absolute: 0.4 10*3/uL (ref 0.1–1.0)
Monocytes Relative: 7 %
Neutro Abs: 3.9 10*3/uL (ref 1.7–7.7)
Neutrophils Relative %: 64 %
Platelets: 158 10*3/uL (ref 150–400)
RBC: 3.51 MIL/uL — ABNORMAL LOW (ref 3.87–5.11)
RDW: 15.2 % (ref 11.5–15.5)
WBC: 6.1 10*3/uL (ref 4.0–10.5)
nRBC: 0 % (ref 0.0–0.2)

## 2023-09-16 LAB — COMPREHENSIVE METABOLIC PANEL
ALT: 9 U/L (ref 0–44)
AST: 11 U/L — ABNORMAL LOW (ref 15–41)
Albumin: 4 g/dL (ref 3.5–5.0)
Alkaline Phosphatase: 99 U/L (ref 38–126)
Anion gap: 9 (ref 5–15)
BUN: 19 mg/dL (ref 8–23)
CO2: 25 mmol/L (ref 22–32)
Calcium: 9.1 mg/dL (ref 8.9–10.3)
Chloride: 102 mmol/L (ref 98–111)
Creatinine, Ser: 1.51 mg/dL — ABNORMAL HIGH (ref 0.44–1.00)
GFR, Estimated: 35 mL/min — ABNORMAL LOW (ref >60)
Glucose, Bld: 91 mg/dL (ref 70–99)
Potassium: 4.8 mmol/L (ref 3.5–5.1)
Sodium: 136 mmol/L (ref 135–145)
Total Bilirubin: 0.5 mg/dL (ref 0.0–1.2)
Total Protein: 7.4 g/dL (ref 6.5–8.1)

## 2023-09-16 LAB — IRON AND TIBC
Iron: 29 ug/dL (ref 28–170)
Saturation Ratios: 14 % (ref 10.4–31.8)
TIBC: 202 ug/dL — ABNORMAL LOW (ref 250–450)
UIBC: 173 ug/dL

## 2023-09-16 LAB — FOLATE: Folate: >40 ng/mL (ref >5.9)

## 2023-09-16 LAB — VITAMIN B12: Vitamin B-12: 512 pg/mL (ref 180–914)

## 2023-09-16 LAB — FERRITIN: Ferritin: 358 ng/mL — ABNORMAL HIGH (ref 11–307)

## 2023-09-16 MED ORDER — EPOETIN ALFA-EPBX 10000 UNIT/ML IJ SOLN
10000.0000 [IU] | Freq: Once | INTRAMUSCULAR | Status: AC
  Administered 2023-09-16: 10000 [IU] via SUBCUTANEOUS
  Filled 2023-09-16: qty 1

## 2023-09-16 NOTE — Progress Notes (Signed)
 [facility] [address], [place] [address]   CLINIC:  Medical Oncology/Hematology  PCP:  Sharee Holster, NP  [address] [place] [address] [phone]   REASON FOR VISIT:  Follow-up for anemia due to CKD stage IIIb   CURRENT THERAPY: IV iron & Retacrit   INTERVAL HISTORY:   Traci Parker 78 y.o. female returns for routine follow-up of anemia.  She was last seen by me on 06/18/23  In the interim, she denies any hospitalizations, surgeries or changes to her baseline health. She has been receiving Retacrit 10,000 units monthly for several years now.  At today's visit, she reports feeling fair.  She denies any major changes in her health or symptoms since her last visit.  She continues to receive Retacrit 10,000 units each month.  She reports feeling improved energy since starting this treatment regimen.  She is tolerating Retacrit well and BP has remained stable. No symptoms of DVT or PE.  Her last Retacrit was on 08/13/2023.   She has not noticed any blood loss such as hematemesis, hematochezia, melena, or epistaxis.   No pica, restless legs, or headaches. No chest pain, dyspnea on exertion, lightheadedness, or syncope.  She continues to take folic acid and Vitamin B12 295 mcg every other day. Using otc eye drops for glaucoma.  She has follow-up with ophthalmology in July.  Appetite is at 100% energy levels are 75%.  Denies any change in her weight.  ASSESSMENT & PLAN:  1.  Normocytic anemia, secondary to CKD and relative iron deficiency - Etiology is from combination of CKD and relative iron deficiency. - Initial work-up included SPEP which was negative.  Stool was negative for occult blood.  Hemoglobin electrophoresis showed severe microcytosis.  She could have underlying thalassemia/sickle cell trait. - Last colonoscopy was in June 2019 in [place], 3 polyps removed. - She has a history of needing a blood transfusion in the early 2000's. -Iron  supplement discontinued in June 2024 due to elevated ferritin. - She receives Retacrit 10,000 units once a month - She denies any bright red blood per rectum or melena        2.  CKD stage IIIa/b - Ultrasound the kidneys on 08/22/2019 showed bilateral renal atrophy, severe atrophy of the right kidney.   3.  B12 and folic acid deficiency: - She previously received B12 injections monthly at SNF - She is currently taking folic acid every other day + vitamin B12 500 mcg every other day.     4.  History of lower extremity DVT - Venous duplex (02/02/2020): Extensive acute appearing left femoropopliteal occlusive DVT extending into the calf veins. - Considered provoked in the setting of immobility/bedbound functional status - Venous ultrasound (03/24/2022) shows a previous left-sided femoral, popliteal, and DVT from 2021 has completely resolved - Eliquis was discontinued by her PCP, placed on aspirin and statin - No current signs or symptoms of recurrent DVT     PLAN: 1. Anemia due to stage 3b chronic kidney disease (HCC) (Primary) -Lab work from 09/16/2023 show hemoglobin of 10.0 (11.1) with pending iron levels. -Proceed with Retacrit today. -On chart review, it appears she has needed Retacrit approximately every 6 to 8 weeks over the past 6 months.  We discussed extending out her visits to every 6 weeks instead of 4.  She was agreeable.  If for what ever reason, her hemoglobin starts to trend down, we will resume every 4-week labs and Retacrit. -Return to  clinic every 6 weeks for lab work plus or minus Retacrit if hemoglobin is less than 11.   -Return to clinic in 18 weeks with office visit, labs and possible Retacrit.  2. CKD stage III - Most recent CMP shows baseline CKD stage IIIb - Continue follow-up with Dr. Wolfgang Phoenix  3.  B12 and folic acid deficiency: -Labs (03/24/23) showed vitamin B12 at goal (1068), normal MMA, normal folate. -Continue vitamin B12 500 mcg EOD and folic acid supplements  EOD - Labs are pending from today.  I will call patient with results to see if we need to adjust her vitamins.   PLAN SUMMARY: >> CBC + Retacrit every 6 weeks >> Proceed with Retacrit today. >> Labs/office visit in 18 weeks = CBC/D, CMP, ferritin, iron/TIBC, folate , B12 and MMA      REVIEW OF SYSTEMS:  Review of Systems  Neurological:  Positive for numbness.     PHYSICAL EXAM:  ECOG PERFORMANCE STATUS: 3 - Symptomatic, >50% confined to bed There were no vitals filed for this visit.  There were no vitals filed for this visit. Physical Exam Constitutional:      Appearance: Normal appearance.  Cardiovascular:     Rate and Rhythm: Normal rate and regular rhythm.  Pulmonary:     Effort: Pulmonary effort is normal.     Breath sounds: Normal breath sounds.  Abdominal:     General: Bowel sounds are normal.     Palpations: Abdomen is soft.  Musculoskeletal:        General: No swelling. Normal range of motion.  Neurological:     Mental Status: She is alert and oriented to person, place, and time. Mental status is at baseline.    PAST MEDICAL/SURGICAL HISTORY:  Past Medical History:  Diagnosis Date   Allergy    Anemia    pernicious anemia   Arthritis    Asthma    Blood transfusion without reported diagnosis    CKD (chronic kidney disease)    COPD (chronic obstructive pulmonary disease) (HCC) 12/23/2019   Diabetic peripheral neuropathy associated with type 2 diabetes mellitus (HCC) 04/07/2021   DM (diabetes mellitus), type 2 with peripheral vascular complications (HCC) 12/23/2019   Gastritis    GERD (gastroesophageal reflux disease)    Glaucoma    History of blood in urine    Hyperlipidemia    Hypertension    Neuropathy    both feet   Ocular hypertension    Osteoporosis    Stroke (HCC)    Vitamin B12 deficiency anemia due to intrinsic factor deficiency    Past Surgical History:  Procedure Laterality Date   CATARACT EXTRACTION W/PHACO Right 11/27/2022    Procedure: CATARACT EXTRACTION PHACO AND INTRAOCULAR LENS PLACEMENT (IOC);  Surgeon: [name], MD;  Location: [facility];  Service: Ophthalmology;  Laterality: Right;  CDE: 15.67   CATARACT EXTRACTION W/PHACO Left 12/18/2022   Procedure: CATARACT EXTRACTION PHACO AND INTRAOCULAR LENS PLACEMENT (IOC);  Surgeon: [name], MD;  Location: [facility];  Service: Ophthalmology;  Laterality: Left;  CDE: 11.78   CHOLECYSTECTOMY  2008   COLONOSCOPY  04/30/2011   Procedure: COLONOSCOPY;  Surgeon: Malissa Hippo, MD;  [facility];  Service: Endoscopy;  Laterality: N/A;   COLONOSCOPY  05/25/2012   Procedure: COLONOSCOPY;  Surgeon: Malissa Hippo, MD;  [facility];  Service: Endoscopy;  Laterality: N/A;  1:25-changed to 1200 Ann to notify pt   COLONOSCOPY Bilateral 12/2017   COLONOSCOPY WITH PROPOFOL N/A 11/29/2020  Procedure: COLONOSCOPY WITH PROPOFOL;  Surgeon: Dolores Frame, MD;  [facility];  Service: Gastroenterology;  Laterality: N/A;  1:35 patient is in the [facility]   ESOPHAGOGASTRODUODENOSCOPY N/A 12/07/2019   Procedure: ESOPHAGOGASTRODUODENOSCOPY (EGD);  Surgeon: Malissa Hippo, MD;  [facility];  Service: Endoscopy;  Laterality: N/A;  155   GALLBLADDER SURGERY     right breast cystectomy  1988   TUBAL LIGATION  1975    SOCIAL HISTORY:  Social History   Socioeconomic History   Marital status: Divorced    Spouse name: Not on file   Number of children: 4   Years of education: 9   Highest education level: Not on file  Occupational History   Occupation: retired   Tobacco Use   Smoking status: Former    Current packs/day: 0.00    Average packs/day: 1 pack/day for 40.0 years (40.0 ttl pk-yrs)    Types: Cigarettes    Start date: 04/03/1971    Quit date: 04/03/2011    Years since quitting: 12.4   Smokeless tobacco: Never  Vaping Use   Vaping status: Never Used  Substance and Sexual Activity   Alcohol use: No   Drug  use: No   Sexual activity: Not Currently  Other Topics Concern   Not on file  Social History Narrative   01/17/20 lives at [facility], [place]   Social Drivers of Health   Financial Resource Strain: Not on file  Food Insecurity: Not on file  Transportation Needs: Not on file  Physical Activity: Not on file  Stress: Not on file  Social Connections: Not on file  Intimate Partner Violence: Not on file    FAMILY HISTORY:  Family History  Problem Relation Age of Onset   Diabetes Mother    Hyperlipidemia Mother    Hypertension Mother    Heart disease Father    Hyperlipidemia Father    Diabetes Sister    Hyperlipidemia Sister    Hypertension Sister    Cancer Brother    Heart disease Brother    Hyperlipidemia Brother    Hypertension Brother    Diabetes Sister     CURRENT MEDICATIONS:  Outpatient Encounter Medications as of 09/16/2023  Medication Sig   aspirin EC 81 MG tablet Take 81 mg by mouth daily. Swallow whole.   brimonidine-timolol (COMBIGAN) 0.2-0.5 % ophthalmic solution Place 1 drop into both eyes every 12 (twelve) hours.   Calcium Carb-Cholecalciferol (CALCIUM 500 + D) 500-5 MG-MCG TABS Take 1 tablet by mouth daily.   cyanocobalamin (V-R VITAMIN B-12) 500 MCG tablet Take 1 tablet (500 mcg total) by mouth every other day.   fluticasone (FLONASE) 50 MCG/ACT nasal spray Place 2 sprays into both nostrils daily.   folic acid (V-R FOLIC ACID) 400 MCG tablet Take 1 tablet (400 mcg total) by mouth every other day.   gabapentin (NEURONTIN) 100 MG capsule Take 200 mg by mouth at bedtime. (2100)   gabapentin (NEURONTIN) 100 MG capsule Take 100 mg by mouth daily.   losartan (COZAAR) 25 MG tablet Take 25 mg by mouth daily.   NON FORMULARY Diet:Regular thin liquids  ALLERGIC TO FISH   omeprazole (PRILOSEC) 40 MG capsule Take 40 mg by mouth daily at 6 (six) AM. (0600)   rosuvastatin (CRESTOR) 20 MG tablet Take 20 mg by mouth daily.   senna-docusate (SENOKOT S) 8.6-50 MG tablet  Take 1 tablet by mouth 2 (two) times daily.   sodium chloride 1 g tablet Take  1 g by mouth 2 (two) times daily with a meal.   No facility-administered encounter medications on file as of 09/16/2023.    ALLERGIES:  Allergies  Allergen Reactions   Latex Rash   Levofloxacin Nausea And Vomiting   Neomycin-Bacitracin-Polymyxin  [Bacitracin-Neomycin-Polymyxin] Rash   Other Swelling and Rash    TOMATO  SOUP-PER MAR   Prednisone Hives, Itching, Rash and Other (See Comments)   Fish Allergy     rash   Shellfish Allergy Hives   Neosporin [Neomycin-Polymyxin-Gramicidin] Itching and Rash    LABORATORY DATA:  I have reviewed the labs as listed.  CBC    Component Value Date/Time   WBC 6.7 08/13/2023 0957   RBC 3.59 (L) 08/13/2023 0957   HGB 10.2 (L) 08/13/2023 0957   HCT 33.6 (L) 08/13/2023 0957   PLT 141 (L) 08/13/2023 0957   MCV 93.6 08/13/2023 0957   MCH 28.4 08/13/2023 0957   MCHC 30.4 08/13/2023 0957   RDW 14.9 08/13/2023 0957   LYMPHSABS 1.9 06/18/2023 1316   MONOABS 0.4 06/18/2023 1316   EOSABS 0.4 06/18/2023 1316   BASOSABS 0.1 06/18/2023 1316      Latest Ref Rng & Units 07/14/2023    8:49 AM 06/18/2023    1:16 PM 06/18/2023    6:00 AM  CMP  Glucose 70 - 99 mg/dL 161  096  73   BUN 8 - 23 mg/dL 18  18  18    Creatinine 0.44 - 1.00 mg/dL 0.45  4.09  8.11   Sodium 135 - 145 mmol/L 134  134  133   Potassium 3.5 - 5.1 mmol/L 4.6  5.1  5.1   Chloride 98 - 111 mmol/L 101  101  101   CO2 22 - 32 mmol/L 25  24  25    Calcium 8.9 - 10.3 mg/dL 9.0  9.3  8.9   Total Protein 6.5 - 8.1 g/dL  7.4  6.4   Total Bilirubin <1.2 mg/dL  0.5    Alkaline Phos 38 - 126 U/L  83    AST 15 - 41 U/L  10    ALT 0 - 44 U/L  9      DIAGNOSTIC IMAGING:  I have independently reviewed the relevant imaging and discussed with the patient.   WRAP UP:  All questions were answered. The patient knows to call the clinic with any problems, questions or concerns.  Medical decision making: Moderate  Time  spent on visit: I spent 20 minutes dedicated to the care of this patient (face-to-face and non-face-to-face) on the date of the encounter to include what is described in the assessment and plan.  Mauro Kaufmann, NP  09/16/23 11:14 AM

## 2023-09-16 NOTE — Progress Notes (Signed)
 Patient's Hgb 10.0 and blood pressure stable. Patient tolerated  Retacrit injection with no complaints voiced.  Site clean and dry with no bruising or swelling noted at site.  See MAR for details.  Band aid applied.  Patient stable during and after injection.  Vss with discharge and left in satisfactory condition with no s/s of distress noted. All follow ups as scheduled. [facility] notified that patient's appointment completed and patient is ready for pick up.  Ryna Beckstrom [employer]

## 2023-09-18 LAB — METHYLMALONIC ACID, SERUM: Methylmalonic Acid, Quantitative: 184 nmol/L (ref 0–378)

## 2023-09-24 ENCOUNTER — Other Ambulatory Visit (HOSPITAL_COMMUNITY)
Admission: RE | Admit: 2023-09-24 | Discharge: 2023-09-24 | Disposition: A | Discharge status: Home / Self Care | Source: Skilled Nursing Facility | Attending: Adult Health | Admitting: Adult Health

## 2023-09-24 DIAGNOSIS — E069 Thyroiditis, unspecified: Secondary | ICD-10-CM | POA: Diagnosis not present

## 2023-09-24 LAB — T4, FREE: Free T4: 0.98 ng/dL (ref 0.61–1.12)

## 2023-09-24 LAB — TSH: TSH: 2.02 u[IU]/mL (ref 0.350–4.500)

## 2023-09-25 LAB — T3, FREE: T3, Free: 1.8 pg/mL — ABNORMAL LOW (ref 2.0–4.4)

## 2023-09-29 ENCOUNTER — Non-Acute Institutional Stay (SKILLED_NURSING_FACILITY): Payer: Self-pay | Admitting: Adult Health

## 2023-09-29 ENCOUNTER — Encounter: Payer: Self-pay | Admitting: Adult Health

## 2023-09-29 DIAGNOSIS — E1122 Type 2 diabetes mellitus with diabetic chronic kidney disease: Secondary | ICD-10-CM

## 2023-09-29 DIAGNOSIS — J449 Chronic obstructive pulmonary disease, unspecified: Secondary | ICD-10-CM | POA: Diagnosis not present

## 2023-09-29 DIAGNOSIS — I129 Hypertensive chronic kidney disease with stage 1 through stage 4 chronic kidney disease, or unspecified chronic kidney disease: Secondary | ICD-10-CM | POA: Diagnosis not present

## 2023-09-29 DIAGNOSIS — N183 Chronic kidney disease, stage 3 unspecified: Secondary | ICD-10-CM | POA: Diagnosis not present

## 2023-09-29 NOTE — Progress Notes (Signed)
 Location:  [facility] Room Number: 149 Place of Service:  SNF (31)   CODE STATUS: dnr   Allergies  Allergen Reactions   Latex Rash   Levofloxacin Nausea And Vomiting   Neomycin-Bacitracin-Polymyxin  [Bacitracin-Neomycin-Polymyxin] Rash   Other Swelling and Rash    TOMATO  SOUP-PER MAR   Prednisone Hives, Itching, Rash and Other (See Comments)   Fish Allergy     rash   Shellfish Allergy Hives   Neosporin [Neomycin-Polymyxin-Gramicidin] Itching and Rash    Chief Complaint  Patient presents with   Medical Management of Chronic Issues             Chronic obstructive pulmonary disease unspecified COPD type:  Hypertension associated with stage 3 chronic kidney disease due to type 2 diabetes mellitus:CKD stage 3 due to type 2 diabetes mellitus     HPI:  She is a 78 year old long term resident of this facility being seen for the management of her chronic illnesses: Chronic obstructive pulmonary disease unspecified COPD type:  Hypertension associated with stage 3 chronic kidney disease due to type 2 diabetes mellitus:CKD stage 3 due to type 2 diabetes mellitus. There are no reports of uncontrolled pain. Her is slowly gaining weight. There are no reports of shortness of breath present.   Past Medical History:  Diagnosis Date   Allergy    Anemia    pernicious anemia   Arthritis    Asthma    Blood transfusion without reported diagnosis    CKD (chronic kidney disease)    COPD (chronic obstructive pulmonary disease) (HCC) 12/23/2019   Diabetic peripheral neuropathy associated with type 2 diabetes mellitus (HCC) 04/07/2021   DM (diabetes mellitus), type 2 with peripheral vascular complications (HCC) 12/23/2019   Gastritis    GERD (gastroesophageal reflux disease)    Glaucoma    History of blood in urine    Hyperlipidemia    Hypertension    Neuropathy    both feet   Ocular hypertension    Osteoporosis    Stroke (HCC)    Vitamin B12 deficiency anemia  due to intrinsic factor deficiency     Past Surgical History:  Procedure Laterality Date   CATARACT EXTRACTION W/PHACO Right 11/27/2022   Procedure: CATARACT EXTRACTION PHACO AND INTRAOCULAR LENS PLACEMENT (IOC);  Surgeon: [name], MD;  Location: [facility];  Service: Ophthalmology;  Laterality: Right;  CDE: 15.67   CATARACT EXTRACTION W/PHACO Left 12/18/2022   Procedure: CATARACT EXTRACTION PHACO AND INTRAOCULAR LENS PLACEMENT (IOC);  Surgeon: [name], MD;  Location: [facility];  Service: Ophthalmology;  Laterality: Left;  CDE: 11.78   CHOLECYSTECTOMY  2008   COLONOSCOPY  04/30/2011   Procedure: COLONOSCOPY;  Surgeon: Malissa Hippo, MD;  Location: [facility];  Service: Endoscopy;  Laterality: N/A;   COLONOSCOPY  05/25/2012   Procedure: COLONOSCOPY;  Surgeon: Malissa Hippo, MD;  Location: [facility];  Service: Endoscopy;  Laterality: N/A;  1:25-changed to 1200 Ann to notify pt   COLONOSCOPY Bilateral 12/2017   COLONOSCOPY WITH PROPOFOL N/A 11/29/2020   Procedure: COLONOSCOPY WITH PROPOFOL;  Surgeon: Dolores Frame, MD;  Location: [facility];  Service: Gastroenterology;  Laterality: N/A;  1:35 patient is in the [facility]   ESOPHAGOGASTRODUODENOSCOPY N/A 12/07/2019   Procedure: ESOPHAGOGASTRODUODENOSCOPY (EGD);  Surgeon: Malissa Hippo, MD;  Location: [facility];  Service: Endoscopy;  Laterality: N/A;  155   GALLBLADDER SURGERY     right breast cystectomy  1988  TUBAL LIGATION  1975    Social History   Socioeconomic History   Marital status: Divorced    Spouse name: Not on file   Number of children: 4   Years of education: 9   Highest education level: Not on file  Occupational History   Occupation: retired   Tobacco Use   Smoking status: Former    Current packs/day: 0.00    Average packs/day: 1 pack/day for 40.0 years (40.0 ttl pk-yrs)    Types: Cigarettes    Start date: 04/03/1971    Quit date: 04/03/2011    Years since quitting: 12.4    Smokeless tobacco: Never  Vaping Use   Vaping status: Never Used  Substance and Sexual Activity   Alcohol use: No   Drug use: No   Sexual activity: Not Currently  Other Topics Concern   Not on file  Social History Narrative   01/17/20 lives at [facility], [facility]   Social Drivers of Health   Financial Resource Strain: Not on file  Food Insecurity: Not on file  Transportation Needs: Not on file  Physical Activity: Not on file  Stress: Not on file  Social Connections: Not on file  Intimate Partner Violence: Not on file   Family History  Problem Relation Age of Onset   Diabetes Mother    Hyperlipidemia Mother    Hypertension Mother    Heart disease Father    Hyperlipidemia Father    Diabetes Sister    Hyperlipidemia Sister    Hypertension Sister    Cancer Brother    Heart disease Brother    Hyperlipidemia Brother    Hypertension Brother    Diabetes Sister       VITAL SIGNS BP 110/61   Pulse 65   Temp 98.1 F (36.7 C)   Resp 17   Ht 5\' 5"  (1.651 m)   Wt 132 lb (59.9 kg)   SpO2 95%   BMI 21.97 kg/m   Outpatient Encounter Medications as of 09/29/2023  Medication Sig   aspirin EC 81 MG tablet Take 81 mg by mouth daily. Swallow whole.   brimonidine-timolol (COMBIGAN) 0.2-0.5 % ophthalmic solution Place 1 drop into both eyes every 12 (twelve) hours.   Calcium Carb-Cholecalciferol (CALCIUM 500 + D) 500-5 MG-MCG TABS Take 1 tablet by mouth daily.   cyanocobalamin (V-R VITAMIN B-12) 500 MCG tablet Take 1 tablet (500 mcg total) by mouth every other day.   fluticasone (FLONASE) 50 MCG/ACT nasal spray Place 2 sprays into both nostrils daily.   folic acid (V-R FOLIC ACID) 400 MCG tablet Take 1 tablet (400 mcg total) by mouth every other day.   gabapentin (NEURONTIN) 100 MG capsule Take 200 mg by mouth at bedtime. (2100)   gabapentin (NEURONTIN) 100 MG capsule Take 100 mg by mouth daily.   losartan (COZAAR) 25 MG tablet Take 25 mg by mouth daily.   NON FORMULARY  Diet:Regular thin liquids  ALLERGIC TO FISH   omeprazole (PRILOSEC) 40 MG capsule Take 40 mg by mouth daily at 6 (six) AM. (0600)   rosuvastatin (CRESTOR) 20 MG tablet Take 20 mg by mouth daily.   senna-docusate (SENOKOT S) 8.6-50 MG tablet Take 1 tablet by mouth 2 (two) times daily.   sodium chloride 1 g tablet Take 1 g by mouth 2 (two) times daily with a meal.   No facility-administered encounter medications on file as of 09/29/2023.     SIGNIFICANT DIAGNOSTIC EXAMS  LABS REVIEWED PREVIOUS  10-01-22: chol 137; ldl 80; trig 53; hdl 46 11-02-22: wbc 6.3; hgb 9.1; hct 28.8; mcv 94.1 plt 136; glucose 78; bun 19; creat 1.44; k+ 3.9; na++ 135; ca 8.7; gfr 37; protein 6.0 albumin 3.3 11-05-22: ACR 0.17 11-06-22: PTH 25 ca 9.4 11-11-22; folate 32.8 vitamin B12: 459; iron 30; tibc 172; ferritin 452; serotonin 78 12-31-22: wbc 5.4; hgb 10.9; hct 34.9; mcv 93.8 plt 167; hgb A1c 4.9; vitamin B12: 829; folate 39.3; vitamin D 59.03 01-27-23: wbc 7.2; hgb 11.3; hct 36.1; mcv 91.6 plt 202 03-24-23: wbc 6.2; hgb 9.8; hct 31.5; mcv 93.5; plt 235; glucose 131; bun 18; creat 1.44; k+ 4.7; na++ 134; ca 8.4; gfr 37; protein 6.9; albumin 3.7  vitamin B12: 1068; iron 33; tibc 173 04-23-23: wbc 7.4; hgb 10.4; hct 34.0; mcv 95.8 plt 143 05-20-23: tsh 2.925 05-21-23: wbc 5.7; hgb 10.4; hct 34.0; mcv 95.2 plt 149    06-18-23: wbc 6.8; hgb 11.1; hct 36.1; mcv 95.3 plt 192; glucose 154; bun 18; creat 1.36; k+ 5.1; na++ 134; ca 9.3; gfr 40 protein 7.4; albumin 4.1  TODAY  07-14-23: wbc 4.8; hgb 9.9; hct 31.0; mcv 92.0 plt 144; glucose 117; bun 18; creat 1.39; k+ 4.6; na++ 134; ca 9.0; gfr 39; d-dimer 1.18; CRP 0.8 08-13-23: wbc 6.7; hgb 10.2; hct 33.6; mcv 93.6 plt 141  09-24-23: tsh 2.020 free t3: 1.8 free t4: 0.98  Review of Systems  Constitutional:  Negative for malaise/fatigue.  Respiratory:  Negative for cough and shortness of breath.   Cardiovascular:  Negative for chest pain, palpitations and leg swelling.   Gastrointestinal:  Negative for abdominal pain, constipation and heartburn.  Musculoskeletal:  Negative for back pain, joint pain and myalgias.  Skin: Negative.   Neurological:  Negative for dizziness.  Psychiatric/Behavioral:  The patient is not nervous/anxious.    Physical Exam Constitutional:      General: She is not in acute distress.    Appearance: She is well-developed. She is not diaphoretic.  Neck:     Thyroid: No thyromegaly.  Cardiovascular:     Rate and Rhythm: Normal rate and regular rhythm.     Pulses: Normal pulses.     Heart sounds: Normal heart sounds.  Pulmonary:     Effort: Pulmonary effort is normal. No respiratory distress.     Breath sounds: Normal breath sounds.  Abdominal:     General: Bowel sounds are normal. There is no distension.     Palpations: Abdomen is soft.     Tenderness: There is no abdominal tenderness.  Musculoskeletal:        General: Normal range of motion.     Cervical back: Neck supple.     Right lower leg: No edema.     Left lower leg: No edema.  Lymphadenopathy:     Cervical: No cervical adenopathy.  Skin:    General: Skin is warm and dry.  Neurological:     Mental Status: She is alert and oriented to person, place, and time.  Psychiatric:        Mood and Affect: Mood normal.    ASSESSMENT/ PLAN:  TODAY   Chronic obstructive pulmonary disease unspecified COPD type: will monitor  2. Hypertension associated with stage 3 chronic kidney disease due to type 2 diabetes mellitus: b/p 110/61: will continue cozaar 25 mg daily   3. CKD stage 3 due to type 2 diabetes mellitus: bun 18; creat 1.39 gfr 39   PREVIOUS    4. Diabetic peripheral neuropathy associated  with type 2 diabetes mellitus: is on gabapentin 100 mg in the AM and 200 mg at hs   5. Type 2 diabetes mellitus with stage 3 chronic kidney disease without long term current use of insulin: hgb A1c 5.3; is on asa 81 mg daily statin and arb   6. Hyperlipidemia associated with  type 2 diabetes mellitus: ldl 63 is on crestor 10 mg daily   7. Anemia due to chronic renal failure with erythropoietin stage 3: hgb 10.2; will continue to monitor  is followed by oncology   8. Other chronic gastritis without hemorrhage: will continue prilosec 40 mg daily   9. Increased intraocular pressure bilateral: will continue truspot to both eyes twice daily   10. Vitamin B 12 deficiency: level 1068; folate 32.8   11. Vitamin D deficiency: level is 41.14; on 1000 units daily   12. Thyroiditis: tsh 2.925; will monitor   13. Cervical myelopathy: will monitor is without change   14. Thrombocytopenia: plt 141 will monitor   15. Failure to thrive in adult: is doing well weight is 132 pounds; will continue to monitor   16. Aortic atherosclerosis (ct 12-23-19) will monitor   17. 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

## 2023-10-08 ENCOUNTER — Other Ambulatory Visit (HOSPITAL_COMMUNITY)
Admission: RE | Admit: 2023-10-08 | Discharge: 2023-10-08 | Disposition: A | Discharge status: Home / Self Care | Source: Skilled Nursing Facility | Attending: Adult Health | Admitting: Adult Health

## 2023-10-08 DIAGNOSIS — E559 Vitamin D deficiency, unspecified: Secondary | ICD-10-CM | POA: Diagnosis not present

## 2023-10-08 DIAGNOSIS — D509 Iron deficiency anemia, unspecified: Secondary | ICD-10-CM | POA: Diagnosis not present

## 2023-10-08 DIAGNOSIS — I1310 Hypertensive heart and chronic kidney disease without heart failure, with stage 1 through stage 4 chronic kidney disease, or unspecified chronic kidney disease: Secondary | ICD-10-CM | POA: Diagnosis not present

## 2023-10-08 DIAGNOSIS — D631 Anemia in chronic kidney disease: Secondary | ICD-10-CM | POA: Insufficient documentation

## 2023-10-08 DIAGNOSIS — N189 Chronic kidney disease, unspecified: Secondary | ICD-10-CM | POA: Diagnosis not present

## 2023-10-08 DIAGNOSIS — I152 Hypertension secondary to endocrine disorders: Secondary | ICD-10-CM | POA: Insufficient documentation

## 2023-10-08 LAB — CBC
HCT: 31.2 % — ABNORMAL LOW (ref 36.0–46.0)
Hemoglobin: 9.4 g/dL — ABNORMAL LOW (ref 12.0–15.0)
MCH: 28.8 pg (ref 26.0–34.0)
MCHC: 30.1 g/dL (ref 30.0–36.0)
MCV: 95.7 fL (ref 80.0–100.0)
Platelets: 178 10*3/uL (ref 150–400)
RBC: 3.26 MIL/uL — ABNORMAL LOW (ref 3.87–5.11)
RDW: 15 % (ref 11.5–15.5)
WBC: 5.7 10*3/uL (ref 4.0–10.5)
nRBC: 0 % (ref 0.0–0.2)

## 2023-10-08 LAB — CREATININE, SERUM
Creatinine, Ser: 1.45 mg/dL — ABNORMAL HIGH (ref 0.44–1.00)
GFR, Estimated: 37 mL/min — ABNORMAL LOW (ref >60)

## 2023-10-08 LAB — HEPATIC FUNCTION PANEL
ALT: 8 U/L (ref 0–44)
AST: 10 U/L — ABNORMAL LOW (ref 15–41)
Albumin: 3.5 g/dL (ref 3.5–5.0)
Alkaline Phosphatase: 90 U/L (ref 38–126)
Bilirubin, Direct: <0.1 mg/dL (ref 0.0–0.2)
Total Bilirubin: 0.4 mg/dL (ref 0.0–1.2)
Total Protein: 6.7 g/dL (ref 6.5–8.1)

## 2023-10-08 LAB — VITAMIN D 25 HYDROXY (VIT D DEFICIENCY, FRACTURES): Vit D, 25-Hydroxy: 44.18 ng/mL (ref 30–100)

## 2023-10-11 ENCOUNTER — Other Ambulatory Visit (HOSPITAL_COMMUNITY)
Admission: RE | Admit: 2023-10-11 | Discharge: 2023-10-11 | Disposition: A | Discharge status: Home / Self Care | Source: Skilled Nursing Facility | Attending: Internal Medicine | Admitting: Internal Medicine

## 2023-10-11 DIAGNOSIS — E069 Thyroiditis, unspecified: Secondary | ICD-10-CM | POA: Diagnosis not present

## 2023-10-12 LAB — PARATHYROID HORMONE, INTACT (NO CA): PTH: 37 pg/mL (ref 15–65)

## 2023-10-12 LAB — PTH, INTACT AND CALCIUM

## 2023-10-15 DIAGNOSIS — R809 Proteinuria, unspecified: Secondary | ICD-10-CM | POA: Diagnosis not present

## 2023-10-15 DIAGNOSIS — N1832 Chronic kidney disease, stage 3b: Secondary | ICD-10-CM | POA: Diagnosis not present

## 2023-10-15 DIAGNOSIS — E871 Hypo-osmolality and hyponatremia: Secondary | ICD-10-CM | POA: Diagnosis not present

## 2023-10-15 DIAGNOSIS — E1129 Type 2 diabetes mellitus with other diabetic kidney complication: Secondary | ICD-10-CM | POA: Diagnosis not present

## 2023-10-18 DIAGNOSIS — Z86718 Personal history of other venous thrombosis and embolism: Secondary | ICD-10-CM | POA: Diagnosis not present

## 2023-10-18 DIAGNOSIS — R488 Other symbolic dysfunctions: Secondary | ICD-10-CM | POA: Diagnosis not present

## 2023-10-19 DIAGNOSIS — R488 Other symbolic dysfunctions: Secondary | ICD-10-CM | POA: Diagnosis not present

## 2023-10-19 DIAGNOSIS — Z86718 Personal history of other venous thrombosis and embolism: Secondary | ICD-10-CM | POA: Diagnosis not present

## 2023-10-20 DIAGNOSIS — Z86718 Personal history of other venous thrombosis and embolism: Secondary | ICD-10-CM | POA: Diagnosis not present

## 2023-10-20 DIAGNOSIS — R488 Other symbolic dysfunctions: Secondary | ICD-10-CM | POA: Diagnosis not present

## 2023-10-21 DIAGNOSIS — R488 Other symbolic dysfunctions: Secondary | ICD-10-CM | POA: Diagnosis not present

## 2023-10-21 DIAGNOSIS — Z86718 Personal history of other venous thrombosis and embolism: Secondary | ICD-10-CM | POA: Diagnosis not present

## 2023-10-22 DIAGNOSIS — R488 Other symbolic dysfunctions: Secondary | ICD-10-CM | POA: Diagnosis not present

## 2023-10-22 DIAGNOSIS — Z86718 Personal history of other venous thrombosis and embolism: Secondary | ICD-10-CM | POA: Diagnosis not present

## 2023-10-25 DIAGNOSIS — R488 Other symbolic dysfunctions: Secondary | ICD-10-CM | POA: Diagnosis not present

## 2023-10-25 DIAGNOSIS — Z86718 Personal history of other venous thrombosis and embolism: Secondary | ICD-10-CM | POA: Diagnosis not present

## 2023-10-26 DIAGNOSIS — R488 Other symbolic dysfunctions: Secondary | ICD-10-CM | POA: Diagnosis not present

## 2023-10-26 DIAGNOSIS — Z86718 Personal history of other venous thrombosis and embolism: Secondary | ICD-10-CM | POA: Diagnosis not present

## 2023-10-27 DIAGNOSIS — Z86718 Personal history of other venous thrombosis and embolism: Secondary | ICD-10-CM | POA: Diagnosis not present

## 2023-10-27 DIAGNOSIS — R488 Other symbolic dysfunctions: Secondary | ICD-10-CM | POA: Diagnosis not present

## 2023-10-28 ENCOUNTER — Encounter: Payer: Self-pay | Admitting: Internal Medicine

## 2023-10-28 ENCOUNTER — Inpatient Hospital Stay: Attending: Hematology

## 2023-10-28 ENCOUNTER — Inpatient Hospital Stay

## 2023-10-28 ENCOUNTER — Non-Acute Institutional Stay (SKILLED_NURSING_FACILITY): Admitting: Internal Medicine

## 2023-10-28 VITALS — BP 160/72 | HR 85 | Temp 96.9°F | Resp 18

## 2023-10-28 DIAGNOSIS — R488 Other symbolic dysfunctions: Secondary | ICD-10-CM | POA: Diagnosis not present

## 2023-10-28 DIAGNOSIS — N183 Chronic kidney disease, stage 3 unspecified: Secondary | ICD-10-CM

## 2023-10-28 DIAGNOSIS — R6889 Other general symptoms and signs: Secondary | ICD-10-CM

## 2023-10-28 DIAGNOSIS — E1142 Type 2 diabetes mellitus with diabetic polyneuropathy: Secondary | ICD-10-CM | POA: Diagnosis not present

## 2023-10-28 DIAGNOSIS — D631 Anemia in chronic kidney disease: Secondary | ICD-10-CM | POA: Insufficient documentation

## 2023-10-28 DIAGNOSIS — E538 Deficiency of other specified B group vitamins: Secondary | ICD-10-CM | POA: Diagnosis not present

## 2023-10-28 DIAGNOSIS — Z86718 Personal history of other venous thrombosis and embolism: Secondary | ICD-10-CM | POA: Diagnosis not present

## 2023-10-28 DIAGNOSIS — N1832 Chronic kidney disease, stage 3b: Secondary | ICD-10-CM | POA: Insufficient documentation

## 2023-10-28 DIAGNOSIS — I10 Essential (primary) hypertension: Secondary | ICD-10-CM | POA: Diagnosis not present

## 2023-10-28 LAB — CBC
HCT: 34.7 % — ABNORMAL LOW (ref 36.0–46.0)
Hemoglobin: 10.5 g/dL — ABNORMAL LOW (ref 12.0–15.0)
MCH: 28.7 pg (ref 26.0–34.0)
MCHC: 30.3 g/dL (ref 30.0–36.0)
MCV: 94.8 fL (ref 80.0–100.0)
Platelets: 158 10*3/uL (ref 150–400)
RBC: 3.66 MIL/uL — ABNORMAL LOW (ref 3.87–5.11)
RDW: 14.8 % (ref 11.5–15.5)
WBC: 8.3 10*3/uL (ref 4.0–10.5)
nRBC: 0 % (ref 0.0–0.2)

## 2023-10-28 MED ORDER — EPOETIN ALFA-EPBX 10000 UNIT/ML IJ SOLN
10000.0000 [IU] | Freq: Once | INTRAMUSCULAR | Status: AC
  Administered 2023-10-28: 10000 [IU] via SUBCUTANEOUS
  Filled 2023-10-28: qty 1

## 2023-10-28 NOTE — Assessment & Plan Note (Addendum)
 10/28/2023 H/H 10.5/34.7 up from prior values of 9.4/31.2.  She thinks she is to have iron  infusion; these values suggest that will not be necessary. Continue to monitor. Current creatinine 1.45 & eGFR 37 indicating CKD Stage 3B.

## 2023-10-28 NOTE — Patient Instructions (Addendum)
 See assessment and plan under each diagnosis in the problem list and acutely for this visit:  CKD stage 3 due to type 2 diabetes mellitus (HCC) Clinically it is doubtful that her CKD is related to diabetes based on serial A1c's.  Nephrology continues to monitor her anemia due to the CKD.  It is actually improved.  Anemia due to chronic renal failure treated with erythropoietin , stage 3 (moderate) 10/28/2023 H/H 10.5/34.7 up from prior values of 9.4/31.2.  She thinks she is to have iron  infusion; these values suggest that will not be necessary. Continue to monitor. Current creatinine 1.45 & eGFR 37 indicating CKD Stage 3B.  Cold intolerance TSH was therapeutic at 2.020 on 09/24/2023.  Diabetic peripheral neuropathy associated with type 2 diabetes mellitus (HCC) The peripheral neuropathy is in the context of a history of B12 deficiency.  Her A1c's have ranged from a low of 4.9 which is most current value up to 5.4 %.This suggests that " Diabetic related peripheral neuropathy" may be erroneous diagnosis.  Hypertension Serial BPs in Epic reviewed ; systolic range has been 110-163, both outliers. Treatment will be guided by verified average systolic BP.

## 2023-10-28 NOTE — Assessment & Plan Note (Addendum)
 The peripheral neuropathy is in the context of a history of B12 deficiency.  Her A1c's have ranged from a low of 4.9 which is most current value up to 5.4 %.This suggests that " Diabetic related peripheral neuropathy" may be erroneous diagnosis.

## 2023-10-28 NOTE — Patient Instructions (Signed)
 [facility] - A DEPT OF [facility]  Discharge Instructions: Thank you for choosing  Cancer Center to provide your oncology and hematology care.  If you have a lab appointment with the Cancer Center - please note that after April 8th, 2024, all labs will be drawn in the cancer center.  You do not have to check in or register with the main entrance as you have in the past but will complete your check-in in the cancer center.  Wear comfortable clothing and clothing appropriate for easy access to any Portacath or PICC line.   We strive to give you quality time with your provider. You may need to reschedule your appointment if you arrive late (15 or more minutes).  Arriving late affects you and other patients whose appointments are after yours.  Also, if you miss three or more appointments without notifying the office, you may be dismissed from the clinic at the provider's discretion.      For prescription refill requests, have your pharmacy contact our office and allow 72 hours for refills to be completed.    Today you received the following chemotherapy and/or immunotherapy agents Retacrit. Epoetin Alfa Injection What is this medication? EPOETIN ALFA (e POE e tin AL fa) treats low levels of red blood cells (anemia) caused by kidney disease, chemotherapy, or HIV medications. It can also be used in people who are at risk for blood loss during surgery. It works by Systems analyst make more red blood cells, which reduces the need for blood transfusions. This medicine may be used for other purposes; ask your health care provider or pharmacist if you have questions. COMMON BRAND NAME(S): Epogen, Procrit, Retacrit What should I tell my care team before I take this medication? They need to know if you have any of these conditions: Blood clots Cancer Heart disease High blood pressure On dialysis Seizures Stroke An unusual or allergic reaction to epoetin  alfa, albumin, benzyl alcohol, other medications, foods, dyes, or preservatives Pregnant or trying to get pregnant Breast-feeding How should I use this medication? This medication is injected into a vein or under the skin. It is usually given by your care team in a hospital or clinic setting. It may also be given at home. If you get this medication at home, you will be taught how to prepare and give it. Use exactly as directed. Take it as directed on the prescription label at the same time every day. Keep taking it unless your care team tells you to stop. It is important that you put your used needles and syringes in a special sharps container. Do not put them in a trash can. If you do not have a sharps container, call your pharmacist or care team to get one. A special MedGuide will be given to you by the pharmacist with each prescription and refill. Be sure to read this information carefully each time. Talk to your care team about the use of this medication in children. While this medication may be used in children as young as 1 month of age for selected conditions, precautions do apply. Overdosage: If you think you have taken too much of this medicine contact a poison control center or emergency room at once. NOTE: This medicine is only for you. Do not share this medicine with others. What if I miss a dose? If you miss a dose, take it as soon as you can. If it is almost time for  your next dose, take only that dose. Do not take double or extra doses. What may interact with this medication? Darbepoetin alfa Methoxy polyethylene glycol-epoetin beta This list may not describe all possible interactions. Give your health care provider a list of all the medicines, herbs, non-prescription drugs, or dietary supplements you use. Also tell them if you smoke, drink alcohol, or use illegal drugs. Some items may interact with your medicine. What should I watch for while using this medication? Visit your care  team for regular checks on your progress. Check your blood pressure as directed. Know what your blood pressure should be and when to contact your care team. Your condition will be monitored carefully while you are receiving this medication. You may need blood work while taking this medication. What side effects may I notice from receiving this medication? Side effects that you should report to your care team as soon as possible: Allergic reactions--skin rash, itching, hives, swelling of the face, lips, tongue, or throat Blood clot--pain, swelling, or warmth in the leg, shortness of breath, chest pain Heart attack--pain or tightness in the chest, shoulders, arms, or jaw, nausea, shortness of breath, cold or clammy skin, feeling faint or lightheaded Increase in blood pressure Rash, fever, and swollen lymph nodes Redness, blistering, peeling, or loosening of the skin, including inside the mouth Seizures Stroke--sudden numbness or weakness of the face, arm, or leg, trouble speaking, confusion, trouble walking, loss of balance or coordination, dizziness, severe headache, change in vision Side effects that usually do not require medical attention (report to your care team if they continue or are bothersome): Bone, joint, or muscle pain Cough Headache Nausea Pain, redness, or irritation at injection site This list may not describe all possible side effects. Call your doctor for medical advice about side effects. You may report side effects to FDA at 1-800-FDA-1088. Where should I keep my medication? Keep out of the reach of children and pets. Store in a refrigerator. Do not freeze. Do not shake. Protect from light. Keep this medication in the original container until you are ready to take it. See product for storage information. Get rid of any unused medication after the expiration date. To get rid of medications that are no longer needed or have expired: Take the medication to a medication take-back  program. Check with your pharmacy or law enforcement to find a location. If you cannot return the medication, ask your pharmacist or care team how to get rid of the medication safely. NOTE: This sheet is a summary. It may not cover all possible information. If you have questions about this medicine, talk to your doctor, pharmacist, or health care provider.  2024 Elsevier/Gold Standard (2021-10-31 00:00:00)       To help prevent nausea and vomiting after your treatment, we encourage you to take your nausea medication as directed.  BELOW ARE SYMPTOMS THAT SHOULD BE REPORTED IMMEDIATELY: *FEVER GREATER THAN 100.4 F (38 C) OR HIGHER *CHILLS OR SWEATING *NAUSEA AND VOMITING THAT IS NOT CONTROLLED WITH YOUR NAUSEA MEDICATION *UNUSUAL SHORTNESS OF BREATH *UNUSUAL BRUISING OR BLEEDING *URINARY PROBLEMS (pain or burning when urinating, or frequent urination) *BOWEL PROBLEMS (unusual diarrhea, constipation, pain near the anus) TENDERNESS IN MOUTH AND THROAT WITH OR WITHOUT PRESENCE OF ULCERS (sore throat, sores in mouth, or a toothache) UNUSUAL RASH, SWELLING OR PAIN  UNUSUAL VAGINAL DISCHARGE OR ITCHING   Items with * indicate a potential emergency and should be followed up as soon as possible or go to the Emergency Department  if any problems should occur.  Please show the CHEMOTHERAPY ALERT CARD or IMMUNOTHERAPY ALERT CARD at check-in to the Emergency Department and triage nurse.  Should you have questions after your visit or need to cancel or reschedule your appointment, please contact [facility] [place] - A DEPT OF [facility] [phone]  and follow the prompts.  Office hours are 8:00 a.m. to 4:30 p.m. Monday - Friday. Please note that voicemails left after 4:00 p.m. may not be returned until the following business day.  We are closed weekends and major holidays. You have access to a nurse at all times for urgent questions. Please call the main number to the clinic  [phone] and follow the prompts.  For any non-urgent questions, you may also contact your provider using MyChart. We now offer e-Visits for anyone 88 and older to request care online for non-urgent symptoms. For details visit [email or web address].   Also download the MyChart app! Go to the app store, search "MyChart", open the app, select [place], and log in with your MyChart username and password.

## 2023-10-28 NOTE — Progress Notes (Signed)
 NURSING HOME LOCATION:  [facility] ROOM NUMBER:  [id number]  CODE STATUS:  DNR  PCP: Marilyne Shu, NP   This is a nursing facility follow up visit of chronic medical diagnoses to document compliance with Regulation 483.30 (c) in The Long Term Care Survey Manual Phase 2 which mandates caregiver visit ( visits can alternate among physician, PA or NP as per statutes) within 10 days of 30 days / 60 days/ 90 days post admission to SNF date  .  Interim medical record and care since last SNF visit was updated with review of diagnostic studies and change in clinical status since last visit were documented.  HPI: She is a permanent resident of this facility with medical diagnoses of COPD, diabetes with peripheral neuropathy, GERD, glaucoma, dyslipidemia, essential hypertension, history of B12 deficiency, history of stroke, and chronic anemia.  Over the last year creatinine has ranged from a low of 1.26 up to a high of 1.51.  Her most recent creatinine was 1.45 on 10/08/2023.  During that same time period the nadir GFR was 35; current value is 37.  This would be compatible with CKD stage IIIb.  She states that she went to her "kidney doctor" and was told that her iron  was low.  Tentatively she is to get an iron  infusion in the near future according to her understanding. She denies any bleeding dyscrasias.  H/H was 10.5/34.7 on 4/17, up from prior values of 9.4/31.2.  She has a history of B12 deficiency; the most recent value was therapeutic at 512 on 09/16/2023. In reference to her cold intolerance; TSH was therapeutic at 2.020 on 09/24/2023. Her CKD and peripheral neuropathy are attributed to diabetes; but the A1c has ranged from 4.9 up to high of 5.4, all values prediabetic.  Most recent value of 4.9%  was on 12/31/2022.  Review of systems: As is always the case her major complaint is cold intolerance.Neuropathic changes of the lower extremities are unchanged.  Otherwise she states "I am  doing fine  for a 78 year old lady."  Constitutional: No fever, significant weight change, fatigue  Eyes: No redness, discharge, pain, vision change ENT/mouth: No nasal congestion,  purulent discharge, earache, change in hearing, sore throat  Cardiovascular: No chest pain, palpitations, paroxysmal nocturnal dyspnea, edema  Respiratory: No cough, sputum production, hemoptysis, DOE, significant snoring, apnea   Gastrointestinal: No heartburn, dysphagia, abdominal pain, nausea /vomiting, rectal bleeding, melena, change in bowels Genitourinary: No dysuria, hematuria, pyuria, incontinence, nocturia Musculoskeletal: No joint stiffness, joint swelling, weakness, pain Dermatologic: No rash, pruritus, change in appearance of skin Neurologic: No dizziness, headache, syncope, seizures Psychiatric: No significant anxiety, depression, insomnia, anorexia Endocrine: No change in hair/skin/nails, excessive thirst, excessive hunger, excessive urination  Hematologic/lymphatic: No significant bruising, lymphadenopathy, abnormal bleeding Allergy/immunology: No itchy/watery eyes, significant sneezing, urticaria, angioedema  Physical exam:  Pertinent or positive findings: She is animated and interactive.  Arcus senilis is present.  There is faint hyperpigmentation in an irregular distribution especially of the nose and lateral cheeks.  She is edentulous.  First heart sound is accentuated.  She has minor low-grade rhonchi homogenously.  Pedal pulses are decreased.  Limb atrophy is noted.Sshe is wearing a jacket over her clothes, mittens, woolen slippers & and a blanket over her lap.  General appearance:  no acute distress, increased work of breathing is present.   Lymphatic: No lymphadenopathy about the head, neck, axilla. Eyes: No conjunctival inflammation or lid edema is present. There is no scleral icterus. Ears:  External ear exam shows no significant lesions or deformities.   Nose:  External nasal examination  shows no deformity or inflammation. Nasal mucosa are pink and moist without lesions, exudates Oral exam:  Lips and gums are healthy appearing. There is no oropharyngeal erythema or exudate. Neck:  No thyromegaly, masses, tenderness noted.    Heart:  Normal rate and regular rhythm  without gallop, murmur, click, rub .  Lungs:  without wheezes,  rales, rubs. Abdomen: Bowel sounds are normal. Abdomen is soft and nontender with no organomegaly, hernias, masses. GU: Deferred  Extremities:  No cyanosis, clubbing, edema  Neurologic exam :Balance, Rhomberg, finger to nose testing could not be completed due to clinical state Skin: Warm & dry w/o tenting. No significant rash.  See summary under each active problem in the Problem List with associated updated therapeutic plan

## 2023-10-28 NOTE — Progress Notes (Signed)
 HGB 10.5 today. Traci Parker presents today for injection per the provider's orders.  Retacrit administration without incident; injection site WNL; see MAR for injection details.  Patient tolerated procedure well and without incident.  Discharged from clinic by wheel chair in stable condition. Alert and oriented x 3. F/U with [facility] as scheduled.

## 2023-10-28 NOTE — Assessment & Plan Note (Signed)
 Clinically it is doubtful that her CKD is related to diabetes based on serial A1c's.  Nephrology continues to monitor her anemia due to the CKD.  It is actually improved.

## 2023-10-28 NOTE — Assessment & Plan Note (Signed)
 TSH was therapeutic at 2.020 on 09/24/2023.

## 2023-10-29 DIAGNOSIS — R488 Other symbolic dysfunctions: Secondary | ICD-10-CM | POA: Diagnosis not present

## 2023-10-29 DIAGNOSIS — Z86718 Personal history of other venous thrombosis and embolism: Secondary | ICD-10-CM | POA: Diagnosis not present

## 2023-10-29 NOTE — Assessment & Plan Note (Signed)
 Serial BPs in Epic reviewed ; systolic range has been 110-163, both outliers. Treatment will be guided by verified average systolic BP.

## 2023-11-05 ENCOUNTER — Encounter: Payer: Self-pay | Admitting: Adult Health

## 2023-11-05 ENCOUNTER — Non-Acute Institutional Stay (SKILLED_NURSING_FACILITY): Payer: Self-pay | Admitting: Adult Health

## 2023-11-05 DIAGNOSIS — I129 Hypertensive chronic kidney disease with stage 1 through stage 4 chronic kidney disease, or unspecified chronic kidney disease: Secondary | ICD-10-CM

## 2023-11-05 DIAGNOSIS — I700 Atherosclerosis of aorta: Secondary | ICD-10-CM | POA: Diagnosis not present

## 2023-11-05 DIAGNOSIS — N183 Chronic kidney disease, stage 3 unspecified: Secondary | ICD-10-CM

## 2023-11-05 DIAGNOSIS — G959 Disease of spinal cord, unspecified: Secondary | ICD-10-CM

## 2023-11-05 DIAGNOSIS — E1122 Type 2 diabetes mellitus with diabetic chronic kidney disease: Secondary | ICD-10-CM | POA: Diagnosis not present

## 2023-11-05 NOTE — Progress Notes (Signed)
 Location:  [facility] Room Number: 150 Place of Service:  SNF (31)   CODE STATUS: dnr   Allergies  Allergen Reactions   Latex Rash   Levofloxacin Nausea And Vomiting   Neomycin-Bacitracin-Polymyxin  [Bacitracin-Neomycin-Polymyxin] Rash   Other Swelling and Rash    TOMATO  SOUP-PER MAR   Prednisone Hives, Itching, Rash and Other (See Comments)   Fish Allergy     rash   Shellfish Allergy Hives   Neosporin [Neomycin-Polymyxin-Gramicidin] Itching and Rash    Chief Complaint  Patient presents with   Acute Visit    Care plan meeting     HPI:  We have come together for her care plan meeting. BIMS 15/15 mood 0/30. She is nonambulatory with no falls. She requires dependent assist with her adl care. She is incontinent of bladder and bowel. Dietary: setup for meals; regular diet appetite 51-100% weight is 133 pounds. Therapy: none at this time. She continues to be followed for her chronic illnesses including: Type 2 diabetes mellitus with stage 3 chronic kidney disease and hypertension   Aortic atherosclerosis  Cervical myelopathy   Past Medical History:  Diagnosis Date   Allergy    Anemia    pernicious anemia   Arthritis    Asthma    Blood transfusion without reported diagnosis    CKD (chronic kidney disease)    COPD (chronic obstructive pulmonary disease) (HCC) 12/23/2019   Diabetic peripheral neuropathy associated with type 2 diabetes mellitus (HCC) 04/07/2021   DM (diabetes mellitus), type 2 with peripheral vascular complications (HCC) 12/23/2019   Gastritis    GERD (gastroesophageal reflux disease)    Glaucoma    History of blood in urine    Hyperlipidemia    Hypertension    Neuropathy    both feet   Ocular hypertension    Osteoporosis    Stroke (HCC)    Vitamin B12 deficiency anemia due to intrinsic factor deficiency     Past Surgical History:  Procedure Laterality Date   CATARACT EXTRACTION W/PHACO Right 11/27/2022   Procedure: CATARACT  EXTRACTION PHACO AND INTRAOCULAR LENS PLACEMENT (IOC);  Surgeon: Tarri Farm, MD;  Location: [facility];  Service: Ophthalmology;  Laterality: Right;  CDE: 15.67   CATARACT EXTRACTION W/PHACO Left 12/18/2022   Procedure: CATARACT EXTRACTION PHACO AND INTRAOCULAR LENS PLACEMENT (IOC);  Surgeon: Tarri Farm, MD;  Location: [facility];  Service: Ophthalmology;  Laterality: Left;  CDE: 11.78   CHOLECYSTECTOMY  2008   COLONOSCOPY  04/30/2011   Procedure: COLONOSCOPY;  Surgeon: Ruby Corporal, MD;  Location: [facility];  Service: Endoscopy;  Laterality: N/A;   COLONOSCOPY  05/25/2012   Procedure: COLONOSCOPY;  Surgeon: Ruby Corporal, MD;  Location: [facility];  Service: Endoscopy;  Laterality: N/A;  1:25-changed to 1200 Ann to notify pt   COLONOSCOPY Bilateral 12/2017   COLONOSCOPY WITH PROPOFOL  N/A 11/29/2020   Procedure: COLONOSCOPY WITH PROPOFOL ;  Surgeon: Urban Garden, MD;  Location: [facility];  Service: Gastroenterology;  Laterality: N/A;  1:35 patient is in the [facility]   ESOPHAGOGASTRODUODENOSCOPY N/A 12/07/2019   Procedure: ESOPHAGOGASTRODUODENOSCOPY (EGD);  Surgeon: Ruby Corporal, MD;  Location: [facility];  Service: Endoscopy;  Laterality: N/A;  155   GALLBLADDER SURGERY     right breast cystectomy  1988   TUBAL LIGATION  1975    Social History   Socioeconomic History   Marital status: Divorced    Spouse name: Not on file   Number of  children: 4   Years of education: 9   Highest education level: Not on file  Occupational History   Occupation: retired   Tobacco Use   Smoking status: Former    Current packs/day: 0.00    Average packs/day: 1 pack/day for 40.0 years (40.0 ttl pk-yrs)    Types: Cigarettes    Start date: 04/03/1971    Quit date: 04/03/2011    Years since quitting: 12.6   Smokeless tobacco: Never  Vaping Use   Vaping status: Never Used  Substance and Sexual Activity   Alcohol use: No   Drug use: No   Sexual activity: Not  Currently  Other Topics Concern   Not on file  Social History Narrative   [birth year] lives at [facility], [place]   Social Drivers of Health   Financial Resource Strain: Not on file  Food Insecurity: Not on file  Transportation Needs: Not on file  Physical Activity: Not on file  Stress: Not on file  Social Connections: Not on file  Intimate Partner Violence: Not on file   Family History  Problem Relation Age of Onset   Diabetes Mother    Hyperlipidemia Mother    Hypertension Mother    Heart disease Father    Hyperlipidemia Father    Diabetes Sister    Hyperlipidemia Sister    Hypertension Sister    Cancer Brother    Heart disease Brother    Hyperlipidemia Brother    Hypertension Brother    Diabetes Sister       VITAL SIGNS BP 132/73   Pulse 88   Temp 97.6 F (36.4 C)   Resp 18   Ht 5\' 5"  (1.651 m)   Wt 133 lb (60.3 kg)   SpO2 99%   BMI 22.13 kg/m   Outpatient Encounter Medications as of 11/05/2023  Medication Sig   aspirin EC 81 MG tablet Take 81 mg by mouth daily. Swallow whole.   brimonidine-timolol (COMBIGAN) 0.2-0.5 % ophthalmic solution Place 1 drop into both eyes every 12 (twelve) hours.   Calcium  Carb-Cholecalciferol (CALCIUM  500 + D) 500-5 MG-MCG TABS Take 1 tablet by mouth daily.   cyanocobalamin  (V-R VITAMIN B-12) 500 MCG tablet Take 1 tablet (500 mcg total) by mouth every other day.   fluticasone (FLONASE) 50 MCG/ACT nasal spray Place 2 sprays into both nostrils daily.   folic acid  (V-R FOLIC ACID ) 400 MCG tablet Take 1 tablet (400 mcg total) by mouth every other day.   gabapentin  (NEURONTIN ) 100 MG capsule Take 200 mg by mouth at bedtime. (2100)   gabapentin  (NEURONTIN ) 100 MG capsule Take 100 mg by mouth daily.   losartan (COZAAR) 25 MG tablet Take 25 mg by mouth daily.   NON FORMULARY Diet:Regular thin liquids  ALLERGIC TO FISH   omeprazole  (PRILOSEC) 40 MG capsule Take 40 mg by mouth daily at 6 (six) AM. (0600)   rosuvastatin (CRESTOR) 20 MG  tablet Take 20 mg by mouth daily.   senna-docusate (SENOKOT S) 8.6-50 MG tablet Take 1 tablet by mouth 2 (two) times daily.   sodium chloride  1 g tablet Take 1 g by mouth 2 (two) times daily with a meal.   No facility-administered encounter medications on file as of 11/05/2023.     SIGNIFICANT DIAGNOSTIC EXAMS  LABS REVIEWED PREVIOUS     11-02-22: wbc 6.3; hgb 9.1; hct 28.8; mcv 94.1 plt 136; glucose 78; bun 19; creat 1.44; k+ 3.9; na++ 135; ca 8.7; gfr 37; protein 6.0 albumin 3.3 11-05-22:  ACR 0.17 11-06-22: PTH 25 ca 9.4 11-11-22; folate 32.8 vitamin B12: 459; iron  30; tibc 172; ferritin 452; serotonin 78 12-31-22: wbc 5.4; hgb 10.9; hct 34.9; mcv 93.8 plt 167; hgb A1c 4.9; vitamin B12: 829; folate 39.3; vitamin D  59.03 01-27-23: wbc 7.2; hgb 11.3; hct 36.1; mcv 91.6 plt 202 03-24-23: wbc 6.2; hgb 9.8; hct 31.5; mcv 93.5; plt 235; glucose 131; bun 18; creat 1.44; k+ 4.7; na++ 134; ca 8.4; gfr 37; protein 6.9; albumin 3.7  vitamin B12: 1068; iron  33; tibc 173 04-23-23: wbc 7.4; hgb 10.4; hct 34.0; mcv 95.8 plt 143 05-20-23: tsh 2.925 05-21-23: wbc 5.7; hgb 10.4; hct 34.0; mcv 95.2 plt 149    06-18-23: wbc 6.8; hgb 11.1; hct 36.1; mcv 95.3 plt 192; glucose 154; bun 18; creat 1.36; k+ 5.1; na++ 134; ca 9.3; gfr 40 protein 7.4; albumin 4.1  TODAY  07-14-23: wbc 4.8; hgb 9.9; hct 31.0; mcv 92.0 plt 144; glucose 117; bun 18; creat 1.39; k+ 4.6; na++ 134; ca 9.0; gfr 39; d-dimer 1.18; CRP 0.8 08-13-23: wbc 6.7; hgb 10.2; hct 33.6; mcv 93.6 plt 141  09-24-23: tsh 2.020 free t3: 1.8 free t4: 0.98  Review of Systems  Constitutional:  Negative for malaise/fatigue.  Respiratory:  Negative for cough and shortness of breath.   Cardiovascular:  Negative for chest pain, palpitations and leg swelling.  Gastrointestinal:  Negative for abdominal pain, constipation and heartburn.  Musculoskeletal:  Negative for back pain, joint pain and myalgias.  Skin: Negative.   Neurological:  Negative for dizziness.   Psychiatric/Behavioral:  The patient is not nervous/anxious.    Physical Exam Constitutional:      General: She is not in acute distress.    Appearance: She is well-developed. She is not diaphoretic.  Neck:     Thyroid : No thyromegaly.  Cardiovascular:     Rate and Rhythm: Normal rate and regular rhythm.     Heart sounds: Normal heart sounds.  Pulmonary:     Effort: Pulmonary effort is normal. No respiratory distress.     Breath sounds: Normal breath sounds.  Abdominal:     General: Bowel sounds are normal. There is no distension.     Palpations: Abdomen is soft.     Tenderness: There is no abdominal tenderness.  Musculoskeletal:        General: Normal range of motion.     Cervical back: Neck supple.     Right lower leg: No edema.     Left lower leg: No edema.  Lymphadenopathy:     Cervical: No cervical adenopathy.  Skin:    General: Skin is warm and dry.  Neurological:     Mental Status: She is alert and oriented to person, place, and time.  Psychiatric:        Mood and Affect: Mood normal.      ASSESSMENT/ PLAN:  TODAY  Type 2 diabetes mellitus with stage 3 chronic kidney disease and hypertension Aortic atherosclerosis Cervical myelopathy   Will continue current medications Will continue current plan of care Will continue to monitor her status.   Time spent with patient: 40 minutes: medications plan of care dietary    Britt Candle NP [facility]  call [phone]

## 2023-11-10 ENCOUNTER — Telehealth: Payer: Self-pay | Admitting: Podiatry

## 2023-11-10 NOTE — Telephone Encounter (Signed)
 Patient daughter Romey Sobieraj) called due to patient not sure of previous appointment made at Dr. Meriam Stamp office prior to moving to new location, [facility] is not showing appointment for Dr. Meriam Stamp. Patient daughter is requesting date and time of scheduled appointment made previously. Contact telephone number, [phone]

## 2023-11-25 ENCOUNTER — Encounter: Payer: Self-pay | Admitting: Adult Health

## 2023-11-25 ENCOUNTER — Non-Acute Institutional Stay (SKILLED_NURSING_FACILITY): Payer: Self-pay | Admitting: Adult Health

## 2023-11-25 DIAGNOSIS — E1169 Type 2 diabetes mellitus with other specified complication: Secondary | ICD-10-CM | POA: Diagnosis not present

## 2023-11-25 DIAGNOSIS — E1142 Type 2 diabetes mellitus with diabetic polyneuropathy: Secondary | ICD-10-CM

## 2023-11-25 DIAGNOSIS — E1122 Type 2 diabetes mellitus with diabetic chronic kidney disease: Secondary | ICD-10-CM

## 2023-11-25 DIAGNOSIS — I129 Hypertensive chronic kidney disease with stage 1 through stage 4 chronic kidney disease, or unspecified chronic kidney disease: Secondary | ICD-10-CM

## 2023-11-25 DIAGNOSIS — N183 Chronic kidney disease, stage 3 unspecified: Secondary | ICD-10-CM | POA: Diagnosis not present

## 2023-11-25 DIAGNOSIS — E785 Hyperlipidemia, unspecified: Secondary | ICD-10-CM

## 2023-11-25 NOTE — Progress Notes (Signed)
 Location:  [facility] Room Number: 151 Place of Service:  SNF (31)   CODE STATUS: dnr   Allergies  Allergen Reactions   Latex Rash   Levofloxacin Nausea And Vomiting   Neomycin-Bacitracin-Polymyxin  [Bacitracin-Neomycin-Polymyxin] Rash   Other Swelling and Rash    TOMATO  SOUP-PER MAR   Prednisone Hives, Itching, Rash and Other (See Comments)   Fish Allergy     rash   Shellfish Allergy Hives   Neosporin [Neomycin-Polymyxin-Gramicidin] Itching and Rash    Chief Complaint  Patient presents with   Medical Management of Chronic Issues        Diabetic peripheral neuropathy associated with type 2 diabetes mellitus:  Type 2 diabetes mellitus with stage 3 chronic kidney disease without long term current use of insulin Hyperlipidemia associated with type 2 diabetes mellitus     HPI:  She is an 78 y.o. long term resident of this facility being seen for the management of her chronic illnesses: Diabetic peripheral neuropathy associated with type 2 diabetes mellitus:  Type 2 diabetes mellitus with stage 3 chronic kidney disease without long term current use of insulin Hyperlipidemia associated with type 2 diabetes mellitus. There are no reports of uncontrolled pain. She continues to use gloves due to hands being cold. Her weight is stable at this time.    Past Medical History:  Diagnosis Date   Allergy    Anemia    pernicious anemia   Arthritis    Asthma    Blood transfusion without reported diagnosis    CKD (chronic kidney disease)    COPD (chronic obstructive pulmonary disease) (HCC) 12/23/2019   Diabetic peripheral neuropathy associated with type 2 diabetes mellitus (HCC) 04/07/2021   DM (diabetes mellitus), type 2 with peripheral vascular complications (HCC) 12/23/2019   Gastritis    GERD (gastroesophageal reflux disease)    Glaucoma    History of blood in urine    Hyperlipidemia    Hypertension    Neuropathy    both feet   Ocular hypertension     Osteoporosis    Stroke (HCC)    Vitamin B12 deficiency anemia due to intrinsic factor deficiency     Past Surgical History:  Procedure Laterality Date   CATARACT EXTRACTION W/PHACO Right 11/27/2022   Procedure: CATARACT EXTRACTION PHACO AND INTRAOCULAR LENS PLACEMENT (IOC);  Surgeon: Tarri Farm, MD;  Location: [facility];  Service: Ophthalmology;  Laterality: Right;  CDE: 15.67   CATARACT EXTRACTION W/PHACO Left 12/18/2022   Procedure: CATARACT EXTRACTION PHACO AND INTRAOCULAR LENS PLACEMENT (IOC);  Surgeon: Tarri Farm, MD;  Location: [facility];  Service: Ophthalmology;  Laterality: Left;  CDE: 11.78   CHOLECYSTECTOMY  2008   COLONOSCOPY  04/30/2011   Procedure: COLONOSCOPY;  Surgeon: Ruby Corporal, MD;  Location: [facility];  Service: Endoscopy;  Laterality: N/A;   COLONOSCOPY  05/25/2012   Procedure: COLONOSCOPY;  Surgeon: Ruby Corporal, MD;  [facility]: [facility];  Service: Endoscopy;  Laterality: N/A;  1:25-changed to 1200 Ann to notify pt   COLONOSCOPY Bilateral 12/2017   COLONOSCOPY WITH PROPOFOL  N/A 11/29/2020   Procedure: COLONOSCOPY WITH PROPOFOL ;  Surgeon: Urban Garden, MD;  [facility]: [facility];  Service: Gastroenterology;  Laterality: N/A;  1:35 [facility] is in the [facility]   ESOPHAGOGASTRODUODENOSCOPY N/A 12/07/2019   Procedure: ESOPHAGOGASTRODUODENOSCOPY (EGD);  Surgeon: Ruby Corporal, MD;  Location: [facility];  Service: Endoscopy;  Laterality: N/A;  155   GALLBLADDER SURGERY  right breast cystectomy  1988   TUBAL LIGATION  1975    Social History   Socioeconomic History   Marital status: Divorced    Spouse name: Not on file   Number of children: 4   Years of education: 9   Highest education level: Not on file  Occupational History   Occupation: retired   Tobacco Use   Smoking status: Former    Current packs/day: 0.00    Average packs/day: 1 pack/day for 40.0 years (40.0 ttl pk-yrs)    Types: Cigarettes    Start date:  04/03/1971    Quit date: 04/03/2011    Years since quitting: 12.6   Smokeless tobacco: Never  Vaping Use   Vaping status: Never Used  Substance and Sexual Activity   Alcohol use: No   Drug use: No   Sexual activity: Not Currently  Other Topics Concern   Not on file  Social History Narrative   01/17/20 lives at [facility], [place]   Social Drivers of Health   Financial Resource Strain: Not on file  Food Insecurity: Not on file  Transportation Needs: Not on file  Physical Activity: Not on file  Stress: Not on file  Social Connections: Not on file  Intimate Partner Violence: Not on file   Family History  Problem Relation Age of Onset   Diabetes Mother    Hyperlipidemia Mother    Hypertension Mother    Heart disease Father    Hyperlipidemia Father    Diabetes Sister    Hyperlipidemia Sister    Hypertension Sister    Cancer Brother    Heart disease Brother    Hyperlipidemia Brother    Hypertension Brother    Diabetes Sister       VITAL SIGNS BP 122/75   Pulse 64   Temp (!) 96.8 F (36 C)   Resp 18   Ht 5\' 5"  (1.651 m)   Wt 135 lb (61.2 kg)   SpO2 98%   BMI 22.47 kg/m   Outpatient Encounter Medications as of 11/25/2023  Medication Sig   cyanocobalamin  1000 MCG tablet Take 1,000 mcg by mouth 2 (two) times a week.   aspirin EC 81 MG tablet Take 81 mg by mouth daily. Swallow whole.   Calcium  Carb-Cholecalciferol (CALCIUM  500 + D) 500-5 MG-MCG TABS Take 1 tablet by mouth daily.   folic acid  (V-R FOLIC ACID ) 400 MCG tablet Take 1 tablet (400 mcg total) by mouth every other day.   gabapentin  (NEURONTIN ) 100 MG capsule Take 200 mg by mouth at bedtime. (2100)   gabapentin  (NEURONTIN ) 100 MG capsule Take 100 mg by mouth daily.   losartan (COZAAR) 25 MG tablet Take 25 mg by mouth daily.   NON FORMULARY Diet:Regular thin liquids  ALLERGIC TO FISH   omeprazole  (PRILOSEC) 40 MG capsule Take 40 mg by mouth daily at 6 (six) AM. (0600)   rosuvastatin (CRESTOR) 20 MG  tablet Take 20 mg by mouth daily.   senna-docusate (SENOKOT S) 8.6-50 MG tablet Take 1 tablet by mouth 2 (two) times daily.   sodium chloride  1 g tablet Take 1 g by mouth 2 (two) times daily with a meal.   No facility-administered encounter medications on file as of 11/25/2023.     SIGNIFICANT DIAGNOSTIC EXAMS  LABS REVIEWED PREVIOUS     11-11-22; folate 32.8 vitamin B12: 459; iron  30; tibc 172; ferritin 452; serotonin 78 12-31-22: wbc 5.4; hgb 10.9; hct 34.9; mcv 93.8 plt 167; hgb A1c 4.9; vitamin  B12: 829; folate 39.3; vitamin D  59.03 01-27-23: wbc 7.2; hgb 11.3; hct 36.1; mcv 91.6 plt 202 03-24-23: wbc 6.2; hgb 9.8; hct 31.5; mcv 93.5; plt 235; glucose 131; bun 18; creat 1.44; k+ 4.7; na++ 134; ca 8.4; gfr 37; protein 6.9; albumin 3.7  vitamin B12: 1068; iron  33; tibc 173 04-23-23: wbc 7.4; hgb 10.4; hct 34.0; mcv 95.8 plt 143 05-20-23: tsh 2.925 05-21-23: wbc 5.7; hgb 10.4; hct 34.0; mcv 95.2 plt 149    06-18-23: wbc 6.8; hgb 11.1; hct 36.1; mcv 95.3 plt 192; glucose 154; bun 18; creat 1.36; k+ 5.1; na++ 134; ca 9.3; gfr 40 protein 7.4; albumin 4.1 07-14-23: wbc 4.8; hgb 9.9; hct 31.0; mcv 92.0 plt 144; glucose 117; bun 18; creat 1.39; k+ 4.6; na++ 134; ca 9.0; gfr 39; d-dimer 1.18; CRP 0.8 08-13-23: wbc 6.7; hgb 10.2; hct 33.6; mcv 93.6 plt 141  09-24-23: tsh 2.020 free t3: 1.8 free t4: 0.98  TODAY  10-08-23: wbc 5.7; hgb 9.4; hct 31.2; mcv 95.7 plt 178; protein 6.7 albumin 3.5 vitamin D  44.18 10-28-23: wbc 8.3; hgb 10.5; hct 34.7 mcv 94.8 plt 158   Review of Systems  Constitutional:  Negative for malaise/fatigue.  Respiratory:  Negative for cough and shortness of breath.   Cardiovascular:  Negative for chest pain, palpitations and leg swelling.  Gastrointestinal:  Negative for abdominal pain, constipation and heartburn.  Musculoskeletal:  Negative for back pain, joint pain and myalgias.  Skin: Negative.   Neurological:  Negative for dizziness.  Psychiatric/Behavioral:  The patient is not  nervous/anxious.    Physical Exam Constitutional:      General: She is not in acute distress.    Appearance: She is well-developed. She is not diaphoretic.  Neck:     Thyroid : No thyromegaly.  Cardiovascular:     Rate and Rhythm: Normal rate and regular rhythm.     Pulses: Normal pulses.     Heart sounds: Normal heart sounds.  Pulmonary:     Effort: Pulmonary effort is normal. No respiratory distress.     Breath sounds: Normal breath sounds.  Abdominal:     General: Bowel sounds are normal. There is no distension.     Palpations: Abdomen is soft.     Tenderness: There is no abdominal tenderness.  Musculoskeletal:        General: Normal range of motion.     Cervical back: Neck supple.     Right lower leg: No edema.     Left lower leg: No edema.  Lymphadenopathy:     Cervical: No cervical adenopathy.  Skin:    General: Skin is warm and dry.  Neurological:     Mental Status: She is alert. Mental status is at baseline.  Psychiatric:        Mood and Affect: Mood normal.       ASSESSMENT/ PLAN:  TODAY   Diabetic peripheral neuropathy associated with type 2 diabetes mellitus: is presently managed; will continue gabapentin  100 mg in the AM and 200 mg in the PM  2. Type 2 diabetes mellitus with stage 3 chronic kidney disease without long term current use of insulin hgb A1c 5.3 is on asa; statin; arb   3. Hyperlipidemia associated with type 2 diabetes mellitus: ldl 63 will continue crestor 20 mg daily   PREVIOUS    4. Anemia due to chronic renal failure with erythropoietin  stage 3: hgb 10.5; will continue to monitor  is followed by oncology   5. Other chronic gastritis without  hemorrhage: will continue prilosec 40 mg daily   6. Increased intraocular pressure bilateral: will continue to monitor   7. Vitamin B 12 deficiency: level 1068; folate 32.8; is on folic acid  every other day and vitamin B 12: 1000 mcg twice weekly    8. Vitamin D  deficiency: level is 44.18; will  monitor   9. Thyroiditis: tsh 2.020; will monitor   10. Cervical myelopathy: will monitor is without change   11. Thrombocytopenia: plt 178 will monitor   12. Failure to thrive in adult: is doing well weight is 135 pounds; will continue to monitor   13. Aortic atherosclerosis (ct 12-23-19) will monitor   14. 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 Chronic obstructive pulmonary disease unspecified COPD type: will monitor  16. Hypertension associated with stage 3 chronic kidney disease due to type 2 diabetes mellitus: b/p 122/75: will continue cozaar 25 mg daily   17. CKD stage 3 due to type 2 diabetes mellitus: bun 18; creat 1.39 gfr 39     Britt Candle NP [facility]   call [phone]

## 2023-12-09 ENCOUNTER — Ambulatory Visit: Admitting: Podiatry

## 2023-12-09 ENCOUNTER — Inpatient Hospital Stay

## 2023-12-09 ENCOUNTER — Inpatient Hospital Stay: Attending: Hematology

## 2023-12-09 VITALS — BP 159/75 | HR 85 | Temp 97.1°F | Resp 18

## 2023-12-09 DIAGNOSIS — N1832 Chronic kidney disease, stage 3b: Secondary | ICD-10-CM | POA: Diagnosis not present

## 2023-12-09 DIAGNOSIS — D631 Anemia in chronic kidney disease: Secondary | ICD-10-CM | POA: Diagnosis not present

## 2023-12-09 DIAGNOSIS — E538 Deficiency of other specified B group vitamins: Secondary | ICD-10-CM | POA: Diagnosis not present

## 2023-12-09 LAB — CBC
HCT: 36.2 % (ref 36.0–46.0)
Hemoglobin: 10.9 g/dL — ABNORMAL LOW (ref 12.0–15.0)
MCH: 28.4 pg (ref 26.0–34.0)
MCHC: 30.1 g/dL (ref 30.0–36.0)
MCV: 94.3 fL (ref 80.0–100.0)
Platelets: 157 10*3/uL (ref 150–400)
RBC: 3.84 MIL/uL — ABNORMAL LOW (ref 3.87–5.11)
RDW: 14.6 % (ref 11.5–15.5)
WBC: 6.2 10*3/uL (ref 4.0–10.5)
nRBC: 0 % (ref 0.0–0.2)

## 2023-12-09 MED ORDER — EPOETIN ALFA-EPBX 10000 UNIT/ML IJ SOLN
10000.0000 [IU] | Freq: Once | INTRAMUSCULAR | Status: AC
  Administered 2023-12-09: 10000 [IU] via SUBCUTANEOUS
  Filled 2023-12-09: qty 1

## 2023-12-09 NOTE — Progress Notes (Signed)
 Patient tolerated injection with no complaints voiced.  Site clean and dry with no bruising or swelling noted at site.  See MAR for details.  Band aid applied.  Patient stable during and after injection.  Vss with discharge and left in satisfactory condition with no s/s of distress noted.

## 2023-12-09 NOTE — Patient Instructions (Signed)

## 2023-12-28 ENCOUNTER — Telehealth: Payer: Self-pay | Admitting: Pharmacist

## 2023-12-28 DIAGNOSIS — I10 Essential (primary) hypertension: Secondary | ICD-10-CM

## 2023-12-28 DIAGNOSIS — E7800 Pure hypercholesterolemia, unspecified: Secondary | ICD-10-CM

## 2023-12-28 NOTE — Progress Notes (Signed)
   12/28/2023  Patient ID: Traci Parker, female   DOB: 05-Aug-1945, 78 y.o.   MRN: [id number]  Pharmacy Quality Measure Review  This patient is appearing on a report for being at risk of failing the adherence measure for diabetes and hypertension (ACEi/ARB) medications this calendar year.   Medication: Losartan 25 mg Rosuvastatin 20 mg Last fill date: 11/04/23 for 30 day supply  Filled at [facility]--they dispense 7 day cycle packs and then do a monthly composite billing. Meds have been filled monthly will assume closure.   Geronimo Krabbe, PharmD, BCACP Clinical Pharmacist [phone]

## 2023-12-29 ENCOUNTER — Non-Acute Institutional Stay (SKILLED_NURSING_FACILITY): Payer: Self-pay | Admitting: Adult Health

## 2023-12-29 DIAGNOSIS — H40053 Ocular hypertension, bilateral: Secondary | ICD-10-CM | POA: Diagnosis not present

## 2023-12-29 DIAGNOSIS — K2950 Unspecified chronic gastritis without bleeding: Secondary | ICD-10-CM

## 2023-12-29 DIAGNOSIS — N183 Chronic kidney disease, stage 3 unspecified: Secondary | ICD-10-CM | POA: Diagnosis not present

## 2023-12-29 DIAGNOSIS — D631 Anemia in chronic kidney disease: Secondary | ICD-10-CM | POA: Diagnosis not present

## 2023-12-30 ENCOUNTER — Encounter: Payer: Self-pay | Admitting: Adult Health

## 2023-12-30 ENCOUNTER — Ambulatory Visit: Admitting: Podiatry

## 2023-12-30 NOTE — Progress Notes (Unsigned)
 Location:  [facility] Room Number: 151 Place of Service:  SNF (31)   CODE STATUS: ***  Allergies  Allergen Reactions   Latex Rash   Levofloxacin Nausea And Vomiting   Neomycin-Bacitracin-Polymyxin  [Bacitracin-Neomycin-Polymyxin] Rash   Other Swelling and Rash    TOMATO  SOUP-PER MAR   Prednisone Hives, Itching, Rash and Other (See Comments)   Fish Allergy     rash   Shellfish Allergy Hives   Neosporin [Neomycin-Polymyxin-Gramicidin] Itching and Rash    Chief Complaint  Patient presents with   Medical Management of Chronic Issues    HPI:    Past Medical History:  Diagnosis Date   Allergy    Anemia    pernicious anemia   Arthritis    Asthma    Blood transfusion without reported diagnosis    CKD (chronic kidney disease)    COPD (chronic obstructive pulmonary disease) (HCC) 12/23/2019   Diabetic peripheral neuropathy associated with type 2 diabetes mellitus (HCC) 04/07/2021   DM (diabetes mellitus), type 2 with peripheral vascular complications (HCC) 12/23/2019   Gastritis    GERD (gastroesophageal reflux disease)    Glaucoma    History of blood in urine    Hyperlipidemia    Hypertension    Neuropathy    both feet   Ocular hypertension    Osteoporosis    Stroke (HCC)    Vitamin B12 deficiency anemia due to intrinsic factor deficiency     Past Surgical History:  Procedure Laterality Date   CATARACT EXTRACTION W/PHACO Right 11/27/2022   Procedure: CATARACT EXTRACTION PHACO AND INTRAOCULAR LENS PLACEMENT (IOC);  Surgeon: Tarri Farm, MD;  Location: [facility];  Service: Ophthalmology;  Laterality: Right;  CDE: 15.67   CATARACT EXTRACTION W/PHACO Left 12/18/2022   Procedure: CATARACT EXTRACTION PHACO AND INTRAOCULAR LENS PLACEMENT (IOC);  Surgeon: Tarri Farm, MD;  Location: [facility];  Service: Ophthalmology;  Laterality: Left;  CDE: 11.78   CHOLECYSTECTOMY  2008   COLONOSCOPY  04/30/2011   Procedure: COLONOSCOPY;  Surgeon: Ruby Corporal, MD;  [facility];  Service: Endoscopy;  Laterality: N/A;   COLONOSCOPY  05/25/2012   Procedure: COLONOSCOPY;  Surgeon: Ruby Corporal, MD;  [facility]: [facility];  Service: Endoscopy;  Laterality: N/A;  1:25-changed to 1200 Ann to notify pt   COLONOSCOPY Bilateral 12/2017   COLONOSCOPY WITH PROPOFOL  N/A 11/29/2020   Procedure: COLONOSCOPY WITH PROPOFOL ;  Surgeon: Urban Garden, MD;  [facility]: [facility];  Service: Gastroenterology;  Laterality: N/A;  1:35 [facility] is in the [facility]   ESOPHAGOGASTRODUODENOSCOPY N/A 12/07/2019   Procedure: ESOPHAGOGASTRODUODENOSCOPY (EGD);  Surgeon: Ruby Corporal, MD;  [facility]: [facility];  Service: Endoscopy;  Laterality: N/A;  155   GALLBLADDER SURGERY     right breast cystectomy  1988   TUBAL LIGATION  1975    Social History   Socioeconomic History   Marital status: Divorced    Spouse name: Not on file   Number of children: 4   Years of education: 9   Highest education level: Not on file  Occupational History   Occupation: retired   Tobacco Use   Smoking status: Former    Current packs/day: 0.00    Average packs/day: 1 pack/day for 40.0 years (40.0 ttl pk-yrs)    Types: Cigarettes    Start date: 04/03/1971    Quit date: 04/03/2011    Years since quitting: 12.7   Smokeless tobacco: Never  Vaping Use  Vaping status: Never Used  Substance and Sexual Activity   Alcohol use: No   Drug use: No   Sexual activity: Not Currently  Other Topics Concern   Not on file  Social History Narrative   01/17/20 lives at [facility], Traci Parker   Social Drivers of Health   Financial Resource Strain: Not on file  Food Insecurity: Not on file  Transportation Needs: Not on file  Physical Activity: Not on file  Stress: Not on file  Social Connections: Not on file  Intimate Partner Violence: Not on file   Family History  Problem Relation Age of Onset   Diabetes Mother    Hyperlipidemia Mother     Hypertension Mother    Heart disease Father    Hyperlipidemia Father    Diabetes Sister    Hyperlipidemia Sister    Hypertension Sister    Cancer Brother    Heart disease Brother    Hyperlipidemia Brother    Hypertension Brother    Diabetes Sister       VITAL SIGNS BP 121/63   Pulse 73   Temp 98.4 F (36.9 C)   Resp (!) 22   Ht 5' 5 (1.651 m)   Wt 136 lb 9.6 oz (62 kg)   SpO2 98%   BMI 22.73 kg/m   Outpatient Encounter Medications as of 12/29/2023  Medication Sig   aspirin EC 81 MG tablet Take 81 mg by mouth daily. Swallow whole.   Calcium  Carb-Cholecalciferol (CALCIUM  500 + D) 500-5 MG-MCG TABS Take 1 tablet by mouth daily.   cyanocobalamin  1000 MCG tablet Take 1,000 mcg by mouth 2 (two) times a week.   folic acid  (V-R FOLIC ACID ) 400 MCG tablet Take 1 tablet (400 mcg total) by mouth every other day.   gabapentin  (NEURONTIN ) 100 MG capsule Take 200 mg by mouth at bedtime. (2100)   gabapentin  (NEURONTIN ) 100 MG capsule Take 100 mg by mouth daily.   losartan (COZAAR) 25 MG tablet Take 25 mg by mouth daily.   NON FORMULARY Diet:Regular thin liquids  ALLERGIC TO FISH   omeprazole  (PRILOSEC) 40 MG capsule Take 40 mg by mouth daily at 6 (six) AM. (0600)   rosuvastatin (CRESTOR) 20 MG tablet Take 20 mg by mouth daily.   senna-docusate (SENOKOT S) 8.6-50 MG tablet Take 1 tablet by mouth 2 (two) times daily.   sodium chloride  1 g tablet Take 1 g by mouth 2 (two) times daily with a meal.   No facility-administered encounter medications on file as of 12/29/2023.     SIGNIFICANT DIAGNOSTIC EXAMS       ASSESSMENT/ PLAN:     Britt Candle NP [facility]  Contact [phone] Monday through Friday 8am- 5pm  After hours call [phone]

## 2023-12-30 NOTE — Progress Notes (Signed)
 This encounter was created in error - please disregard.

## 2024-01-17 ENCOUNTER — Other Ambulatory Visit (HOSPITAL_COMMUNITY)
Admission: RE | Admit: 2024-01-17 | Discharge: 2024-01-17 | Disposition: A | Discharge status: Home / Self Care | Source: Skilled Nursing Facility | Attending: Adult Health | Admitting: Adult Health

## 2024-01-17 DIAGNOSIS — N189 Chronic kidney disease, unspecified: Secondary | ICD-10-CM | POA: Insufficient documentation

## 2024-01-17 LAB — CBC
HCT: 34.1 % — ABNORMAL LOW (ref 36.0–46.0)
Hemoglobin: 10.8 g/dL — ABNORMAL LOW (ref 12.0–15.0)
MCH: 28.5 pg (ref 26.0–34.0)
MCHC: 31.7 g/dL (ref 30.0–36.0)
MCV: 90 fL (ref 80.0–100.0)
Platelets: 169 K/uL (ref 150–400)
RBC: 3.79 MIL/uL — ABNORMAL LOW (ref 3.87–5.11)
RDW: 14.9 % (ref 11.5–15.5)
WBC: 6 K/uL (ref 4.0–10.5)
nRBC: 0 % (ref 0.0–0.2)

## 2024-01-17 LAB — RENAL FUNCTION PANEL
Albumin: 3.8 g/dL (ref 3.5–5.0)
Anion gap: 8 (ref 5–15)
BUN: 18 mg/dL (ref 8–23)
CO2: 25 mmol/L (ref 22–32)
Calcium: 9.1 mg/dL (ref 8.9–10.3)
Chloride: 106 mmol/L (ref 98–111)
Creatinine, Ser: 1.51 mg/dL — ABNORMAL HIGH (ref 0.44–1.00)
GFR, Estimated: 35 mL/min — ABNORMAL LOW (ref >60)
Glucose, Bld: 95 mg/dL (ref 70–99)
Phosphorus: 3.9 mg/dL (ref 2.5–4.6)
Potassium: 4.5 mmol/L (ref 3.5–5.1)
Sodium: 139 mmol/L (ref 135–145)

## 2024-01-19 DIAGNOSIS — R809 Proteinuria, unspecified: Secondary | ICD-10-CM | POA: Diagnosis not present

## 2024-01-19 DIAGNOSIS — E1129 Type 2 diabetes mellitus with other diabetic kidney complication: Secondary | ICD-10-CM | POA: Diagnosis not present

## 2024-01-19 DIAGNOSIS — E1122 Type 2 diabetes mellitus with diabetic chronic kidney disease: Secondary | ICD-10-CM | POA: Diagnosis not present

## 2024-01-19 DIAGNOSIS — N1832 Chronic kidney disease, stage 3b: Secondary | ICD-10-CM | POA: Diagnosis not present

## 2024-01-20 DIAGNOSIS — M6281 Muscle weakness (generalized): Secondary | ICD-10-CM | POA: Diagnosis not present

## 2024-01-20 DIAGNOSIS — M810 Age-related osteoporosis without current pathological fracture: Secondary | ICD-10-CM | POA: Diagnosis not present

## 2024-01-20 DIAGNOSIS — Z961 Presence of intraocular lens: Secondary | ICD-10-CM | POA: Diagnosis not present

## 2024-01-20 DIAGNOSIS — E119 Type 2 diabetes mellitus without complications: Secondary | ICD-10-CM | POA: Diagnosis not present

## 2024-01-20 DIAGNOSIS — M4802 Spinal stenosis, cervical region: Secondary | ICD-10-CM | POA: Diagnosis not present

## 2024-01-20 DIAGNOSIS — Z9181 History of falling: Secondary | ICD-10-CM | POA: Diagnosis not present

## 2024-01-20 DIAGNOSIS — R279 Unspecified lack of coordination: Secondary | ICD-10-CM | POA: Diagnosis not present

## 2024-01-20 DIAGNOSIS — H1045 Other chronic allergic conjunctivitis: Secondary | ICD-10-CM | POA: Diagnosis not present

## 2024-01-20 DIAGNOSIS — H26493 Other secondary cataract, bilateral: Secondary | ICD-10-CM | POA: Diagnosis not present

## 2024-01-20 DIAGNOSIS — H401132 Primary open-angle glaucoma, bilateral, moderate stage: Secondary | ICD-10-CM | POA: Diagnosis not present

## 2024-01-21 ENCOUNTER — Inpatient Hospital Stay

## 2024-01-21 ENCOUNTER — Inpatient Hospital Stay: Attending: Hematology | Admitting: Oncology

## 2024-01-21 VITALS — BP 150/77 | HR 84 | Temp 98.1°F | Resp 16

## 2024-01-21 DIAGNOSIS — N183 Chronic kidney disease, stage 3 unspecified: Secondary | ICD-10-CM

## 2024-01-21 DIAGNOSIS — D631 Anemia in chronic kidney disease: Secondary | ICD-10-CM | POA: Diagnosis not present

## 2024-01-21 DIAGNOSIS — E538 Deficiency of other specified B group vitamins: Secondary | ICD-10-CM | POA: Diagnosis not present

## 2024-01-21 DIAGNOSIS — M810 Age-related osteoporosis without current pathological fracture: Secondary | ICD-10-CM | POA: Diagnosis not present

## 2024-01-21 DIAGNOSIS — N1832 Chronic kidney disease, stage 3b: Secondary | ICD-10-CM | POA: Diagnosis not present

## 2024-01-21 DIAGNOSIS — M4802 Spinal stenosis, cervical region: Secondary | ICD-10-CM | POA: Diagnosis not present

## 2024-01-21 DIAGNOSIS — M6281 Muscle weakness (generalized): Secondary | ICD-10-CM | POA: Diagnosis not present

## 2024-01-21 DIAGNOSIS — Z9181 History of falling: Secondary | ICD-10-CM | POA: Diagnosis not present

## 2024-01-21 DIAGNOSIS — R279 Unspecified lack of coordination: Secondary | ICD-10-CM | POA: Diagnosis not present

## 2024-01-21 DIAGNOSIS — D508 Other iron deficiency anemias: Secondary | ICD-10-CM | POA: Diagnosis not present

## 2024-01-21 LAB — CBC WITH DIFFERENTIAL/PLATELET
Abs Immature Granulocytes: 0.01 K/uL (ref 0.00–0.07)
Basophils Absolute: 0 K/uL (ref 0.0–0.1)
Basophils Relative: 1 %
Eosinophils Absolute: 0.4 K/uL (ref 0.0–0.5)
Eosinophils Relative: 6 %
HCT: 35.3 % — ABNORMAL LOW (ref 36.0–46.0)
Hemoglobin: 10.8 g/dL — ABNORMAL LOW (ref 12.0–15.0)
Immature Granulocytes: 0 %
Lymphocytes Relative: 26 %
Lymphs Abs: 1.8 K/uL (ref 0.7–4.0)
MCH: 28.1 pg (ref 26.0–34.0)
MCHC: 30.6 g/dL (ref 30.0–36.0)
MCV: 91.9 fL (ref 80.0–100.0)
Monocytes Absolute: 0.5 K/uL (ref 0.1–1.0)
Monocytes Relative: 8 %
Neutro Abs: 4.1 K/uL (ref 1.7–7.7)
Neutrophils Relative %: 59 %
Platelets: 175 K/uL (ref 150–400)
RBC: 3.84 MIL/uL — ABNORMAL LOW (ref 3.87–5.11)
RDW: 15 % (ref 11.5–15.5)
WBC: 6.9 K/uL (ref 4.0–10.5)
nRBC: 0 % (ref 0.0–0.2)

## 2024-01-21 LAB — COMPREHENSIVE METABOLIC PANEL WITH GFR
ALT: 7 U/L (ref 0–44)
AST: 13 U/L — ABNORMAL LOW (ref 15–41)
Albumin: 3.9 g/dL (ref 3.5–5.0)
Alkaline Phosphatase: 103 U/L (ref 38–126)
Anion gap: 12 (ref 5–15)
BUN: 16 mg/dL (ref 8–23)
CO2: 24 mmol/L (ref 22–32)
Calcium: 9.1 mg/dL (ref 8.9–10.3)
Chloride: 103 mmol/L (ref 98–111)
Creatinine, Ser: 1.61 mg/dL — ABNORMAL HIGH (ref 0.44–1.00)
GFR, Estimated: 32 mL/min — ABNORMAL LOW (ref >60)
Glucose, Bld: 118 mg/dL — ABNORMAL HIGH (ref 70–99)
Potassium: 4.5 mmol/L (ref 3.5–5.1)
Sodium: 139 mmol/L (ref 135–145)
Total Bilirubin: 0.4 mg/dL (ref 0.0–1.2)
Total Protein: 7.6 g/dL (ref 6.5–8.1)

## 2024-01-21 LAB — IRON AND TIBC
Iron: 27 ug/dL — ABNORMAL LOW (ref 28–170)
Saturation Ratios: 15 % (ref 10.4–31.8)
TIBC: 183 ug/dL — ABNORMAL LOW (ref 250–450)
UIBC: 156 ug/dL

## 2024-01-21 LAB — FERRITIN: Ferritin: 318 ng/mL — ABNORMAL HIGH (ref 11–307)

## 2024-01-21 MED ORDER — EPOETIN ALFA-EPBX 10000 UNIT/ML IJ SOLN
10000.0000 [IU] | Freq: Once | INTRAMUSCULAR | Status: AC
  Administered 2024-01-21: 10000 [IU] via SUBCUTANEOUS
  Filled 2024-01-21: qty 1

## 2024-01-21 NOTE — Progress Notes (Signed)
 Traci Parker presents today for injection per the provider's orders.  Retacrit  10,000 unit administration without incident; injection site WNL; see MAR for injection details.  Patient tolerated procedure well and without incident.  No questions or complaints noted at this time. Patient discharged ambulatory by wheelchair with nurse from facility.

## 2024-01-21 NOTE — Progress Notes (Signed)
 [facility] [address], [place] [address]   CLINIC:  Medical Oncology/Hematology  PCP:  Landy Barnie RAMAN, NP  [address] [place] [address] [phone]   REASON FOR VISIT:  Follow-up for anemia due to CKD stage IIIb   CURRENT THERAPY: IV iron  & Retacrit    INTERVAL HISTORY:   Traci Parker 78 y.o. female returns for routine follow-up of anemia.  She was last seen by me on 09/16/23.  In the interim, she denies any hospitalizations, surgeries or changes to her baseline health. She has been receiving Retacrit  10,000 units every 6 weeks past few months.   Reports she continues to tolerate Retacrit  well.  Denies any changes to her health.  Appetite and energy levels are 75%.  She continues to have chronic numbness and burning in her feet.  She lives at the [facility].  Her last Retacrit  was given on 12/09/2023.   She has not noticed any blood loss such as hematemesis, hematochezia, melena, or epistaxis.   No pica, restless legs, or headaches. No chest pain, dyspnea on exertion, lightheadedness, or syncope.  She continues to take folic acid  and Vitamin B12 500 mcg every other day. Using otc eye drops for glaucoma.   She is currently not taking iron  supplements although she has been on vitamins in the past.  Appetite is at 100% energy levels are 75%.  Denies any change in her weight.  ASSESSMENT & PLAN:  1.  Normocytic anemia, secondary to CKD and relative iron  deficiency - Etiology is from combination of CKD and relative iron  deficiency. - Initial work-up included SPEP which was negative.  Stool was negative for occult blood.  Hemoglobin electrophoresis showed severe microcytosis.  She could have underlying thalassemia/sickle cell trait. - Last colonoscopy was in June 2019 in [place], 3 polyps removed. - She has a history of needing a blood transfusion in the early 2000's. -Iron  supplement discontinued in June 2024 due to elevated ferritin. - She  receives Retacrit  10,000 units every 6 weeks.  - She denies any bright red blood per rectum or melena        2.  CKD stage IIIa/b - Ultrasound the kidneys on 08/22/2019 showed bilateral renal atrophy, severe atrophy of the right kidney.   3.  B12 and folic acid  deficiency: - She previously received B12 injections monthly at SNF - She is currently taking folic acid  every other day + vitamin B12 500 mcg every other day.   -Will recheck levels at her next visit.   4.  History of lower extremity DVT - Venous duplex (02/02/2020): Extensive acute appearing left femoropopliteal occlusive DVT extending into the calf veins. - Considered provoked in the setting of immobility/bedbound functional status - Venous ultrasound (03/24/2022) shows a previous left-sided femoral, popliteal, and DVT from 2021 has completely resolved - Eliquis  was discontinued by her PCP, placed on aspirin and statin - No current signs or symptoms of recurrent DVT     PLAN: 1. Anemia due to stage 3b chronic kidney disease (HCC) (Primary) -Lab work from 01/21/24 show hemoglobin of 10.8 (10.9).  -Iron  levels total iron  27, TIBC 183, iron  saturation 15% with ferritin of 318. -She previously was on oral iron  tablets but these were discontinued secondary to elevated ferritin levels.  We discussed restarting iron  tablets every other day if she can tolerate. -Proceed with Retacrit  today. - Retacrit  was adjusted to every 6 weeks at her last visit from every 4 weeks because she was  not needing at each visit.  Appears to be holding her between 10 and 11 over the past few months. -Return to clinic every 6 weeks with labs plus or minus Retacrit  and in 18 weeks with labs, office visit and Retacrit .  2. CKD stage III - Most recent CMP shows baseline CKD stage IIIb - Continue follow-up with Dr. Rachele  3.  B12 and folic acid  deficiency: -Labs (09/16/23) showed vitamin B12 at goal 512 (1068), normal MMA, normal folate. -Continue vitamin B12  500 mcg EOD and folic acid  supplements EOD   PLAN SUMMARY: >> CBC + Retacrit  every 6 weeks >> Proceed with Retacrit  today. >> Restarting iron  supplements every other day.  Continue B12 and folic acid  supplements. >> Labs/office visit in 18 weeks = CBC/D, CMP, ferritin, iron /TIBC, folate , B12 and MMA      REVIEW OF SYSTEMS:  Review of Systems  Neurological:  Positive for numbness.     PHYSICAL EXAM:  ECOG PERFORMANCE STATUS: 3 - Symptomatic, >50% confined to bed Vitals:   01/21/24 1114  BP: (!) 150/77  Pulse: 84  Resp: 16  Temp: 98.1 F (36.7 C)  SpO2: 100%    There were no vitals filed for this visit. Physical Exam Constitutional:      Appearance: Normal appearance.  Cardiovascular:     Rate and Rhythm: Normal rate and regular rhythm.  Pulmonary:     Effort: Pulmonary effort is normal.     Breath sounds: Normal breath sounds.  Abdominal:     General: Bowel sounds are normal.     Palpations: Abdomen is soft.  Musculoskeletal:        General: No swelling. Normal range of motion.  Neurological:     Mental Status: She is alert and oriented to person, place, and time. Mental status is at baseline.    PAST MEDICAL/SURGICAL HISTORY:  Past Medical History:  Diagnosis Date   Allergy    Anemia    pernicious anemia   Arthritis    Asthma    Blood transfusion without reported diagnosis    CKD (chronic kidney disease)    COPD (chronic obstructive pulmonary disease) (HCC) 12/23/2019   Diabetic peripheral neuropathy associated with type 2 diabetes mellitus (HCC) 04/07/2021   DM (diabetes mellitus), type 2 with peripheral vascular complications (HCC) 12/23/2019   Gastritis    GERD (gastroesophageal reflux disease)    Glaucoma    History of blood in urine    Hyperlipidemia    Hypertension    Neuropathy    both feet   Ocular hypertension    Osteoporosis    Stroke (HCC)    Vitamin B12 deficiency anemia due to intrinsic factor deficiency    Past Surgical History:   Procedure Laterality Date   CATARACT EXTRACTION W/PHACO Right 11/27/2022   Procedure: CATARACT EXTRACTION PHACO AND INTRAOCULAR LENS PLACEMENT (IOC);  Surgeon: Harrie Agent, MD;  Location: [facility];  Service: Ophthalmology;  Laterality: Right;  CDE: 15.67   CATARACT EXTRACTION W/PHACO Left 12/18/2022   Procedure: CATARACT EXTRACTION PHACO AND INTRAOCULAR LENS PLACEMENT (IOC);  Surgeon: Harrie Agent, MD;  Location: [facility];  Service: Ophthalmology;  Laterality: Left;  CDE: 11.78   CHOLECYSTECTOMY  2008   COLONOSCOPY  04/30/2011   Procedure: COLONOSCOPY;  Surgeon: Claudis RAYMOND Rivet, MD;  Location: [facility];  Service: Endoscopy;  Laterality: N/A;   COLONOSCOPY  05/25/2012   Procedure: COLONOSCOPY;  Surgeon: Claudis RAYMOND Rivet, MD;  Location: [facility];  Service: Endoscopy;  Laterality:  N/A;  1:25-changed to 1200 Ann to notify pt   COLONOSCOPY Bilateral 12/2017   COLONOSCOPY WITH PROPOFOL  N/A 11/29/2020   Procedure: COLONOSCOPY WITH PROPOFOL ;  Surgeon: Eartha Angelia Sieving, MD;  Location: [facility];  Service: Gastroenterology;  Laterality: N/A;  1:35 patient is in the [facility]   ESOPHAGOGASTRODUODENOSCOPY N/A 12/07/2019   Procedure: ESOPHAGOGASTRODUODENOSCOPY (EGD);  Surgeon: Golda Claudis PENNER, MD;  Location: [facility];  Service: Endoscopy;  Laterality: N/A;  155   GALLBLADDER SURGERY     right breast cystectomy  1988   TUBAL LIGATION  1975    SOCIAL HISTORY:  Social History   Socioeconomic History   Marital status: Divorced    Spouse name: Not on file   Number of children: 4   Years of education: 9   Highest education level: Not on file  Occupational History   Occupation: retired   Tobacco Use   Smoking status: Former    Current packs/day: 0.00    Average packs/day: 1 pack/day for 40.0 years (40.0 ttl pk-yrs)    Types: Cigarettes    Start date: 04/03/1971    Quit date: 04/03/2011    Years since quitting: 12.8   Smokeless tobacco: Never  Vaping Use   Vaping  status: Never Used  Substance and Sexual Activity   Alcohol use: No   Drug use: No   Sexual activity: Not Currently  Other Topics Concern   Not on file  Social History Narrative   01/17/20 lives at [facility], [place]   Social Drivers of Health   Financial Resource Strain: Not on file  Food Insecurity: Not on file  Transportation Needs: Not on file  Physical Activity: Not on file  Stress: Not on file  Social Connections: Not on file  Intimate Partner Violence: Not on file    FAMILY HISTORY:  Family History  Problem Relation Age of Onset   Diabetes Mother    Hyperlipidemia Mother    Hypertension Mother    Heart disease Father    Hyperlipidemia Father    Diabetes Sister    Hyperlipidemia Sister    Hypertension Sister    Cancer Brother    Heart disease Brother    Hyperlipidemia Brother    Hypertension Brother    Diabetes Sister     CURRENT MEDICATIONS:  Outpatient Encounter Medications as of 01/21/2024  Medication Sig   amLODipine  (NORVASC ) 5 MG tablet Take 5 mg by mouth daily.   aspirin EC 81 MG tablet Take 81 mg by mouth daily. Swallow whole.   Calcium  Carb-Cholecalciferol (CALCIUM  500 + D) 500-5 MG-MCG TABS Take 1 tablet by mouth daily.   cyanocobalamin  1000 MCG tablet Take 1,000 mcg by mouth 2 (two) times a week.   folic acid  (V-R FOLIC ACID ) 400 MCG tablet Take 1 tablet (400 mcg total) by mouth every other day.   gabapentin  (NEURONTIN ) 100 MG capsule Take 200 mg by mouth at bedtime. (2100)   gabapentin  (NEURONTIN ) 100 MG capsule Take 100 mg by mouth daily.   losartan (COZAAR) 25 MG tablet Take 25 mg by mouth daily.   NON FORMULARY Diet:Regular thin liquids  ALLERGIC TO FISH   omeprazole  (PRILOSEC) 40 MG capsule Take 40 mg by mouth daily at 6 (six) AM. (0600)   rosuvastatin (CRESTOR) 20 MG tablet Take 20 mg by mouth daily.   senna-docusate (SENOKOT S) 8.6-50 MG tablet Take 1 tablet by mouth 2 (two) times daily.   sodium chloride  1 g tablet Take 1 g  by mouth 2  (two) times daily with a meal.   No facility-administered encounter medications on file as of 01/21/2024.    ALLERGIES:  Allergies  Allergen Reactions   Latex Rash   Levofloxacin Nausea And Vomiting   Neomycin-Bacitracin-Polymyxin  [Bacitracin-Neomycin-Polymyxin] Rash   Other Swelling and Rash    TOMATO  SOUP-PER MAR   Prednisone Hives, Itching, Rash and Other (See Comments)   Fish Allergy     rash   Shellfish Allergy Hives   Neosporin [Neomycin-Polymyxin-Gramicidin] Itching and Rash    LABORATORY DATA:  I have reviewed the labs as listed.  CBC    Component Value Date/Time   WBC 6.9 01/21/2024 1035   RBC 3.84 (L) 01/21/2024 1035   HGB 10.8 (L) 01/21/2024 1035   HCT 35.3 (L) 01/21/2024 1035   PLT 175 01/21/2024 1035   MCV 91.9 01/21/2024 1035   MCH 28.1 01/21/2024 1035   MCHC 30.6 01/21/2024 1035   RDW 15.0 01/21/2024 1035   LYMPHSABS 1.8 01/21/2024 1035   MONOABS 0.5 01/21/2024 1035   EOSABS 0.4 01/21/2024 1035   BASOSABS 0.0 01/21/2024 1035      Latest Ref Rng & Units 01/21/2024   10:35 AM 01/17/2024    8:00 AM 10/08/2023    3:51 AM  CMP  Glucose 70 - 99 mg/dL 881  95    BUN 8 - 23 mg/dL 16  18    Creatinine 9.55 - 1.00 mg/dL 8.38  8.48  8.54   Sodium 135 - 145 mmol/L 139  139    Potassium 3.5 - 5.1 mmol/L 4.5  4.5    Chloride 98 - 111 mmol/L 103  106    CO2 22 - 32 mmol/L 24  25    Calcium  8.9 - 10.3 mg/dL 9.1  9.1  NPLASM   Total Protein 6.5 - 8.1 g/dL 7.6   6.7   Total Bilirubin 0.0 - 1.2 mg/dL 0.4   0.4   Alkaline Phos 38 - 126 U/L 103   90   AST 15 - 41 U/L 13   10   ALT 0 - 44 U/L 7   8     DIAGNOSTIC IMAGING:  I have independently reviewed the relevant imaging and discussed with the patient.   WRAP UP:  All questions were answered. The patient knows to call the clinic with any problems, questions or concerns.  Medical decision making: Moderate  Time spent on visit: I spent 20 minutes dedicated to the care of this patient (face-to-face and  non-face-to-face) on the date of the encounter to include what is described in the assessment and plan.  Delon FORBES Hope, NP  01/21/24 12:01 PM

## 2024-01-21 NOTE — Patient Instructions (Signed)
 [facility] - A DEPT OF [facility]  Discharge Instructions: Thank you for choosing [facility] to provide your oncology and hematology care.  If you have a lab appointment with the Cancer Center - please note that after April 8th, 2024, all labs will be drawn in the cancer center.  You do not have to check in or register with the main entrance as you have in the past but will complete your check-in in the cancer center.  Wear comfortable clothing and clothing appropriate for easy access to any Portacath or PICC line.   We strive to give you quality time with your provider. You may need to reschedule your appointment if you arrive late (15 or more minutes).  Arriving late affects you and other patients whose appointments are after yours.  Also, if you miss three or more appointments without notifying the office, you may be dismissed from the clinic at the provider's discretion.      For prescription refill requests, have your pharmacy contact our office and allow 72 hours for refills to be completed.    Today you received the following agents Retacrit  injection.      To help prevent nausea and vomiting after your treatment, we encourage you to take your nausea medication as directed.  BELOW ARE SYMPTOMS THAT SHOULD BE REPORTED IMMEDIATELY: *FEVER GREATER THAN 100.4 F (38 C) OR HIGHER *CHILLS OR SWEATING *NAUSEA AND VOMITING THAT IS NOT CONTROLLED WITH YOUR NAUSEA MEDICATION *UNUSUAL SHORTNESS OF BREATH *UNUSUAL BRUISING OR BLEEDING *URINARY PROBLEMS (pain or burning when urinating, or frequent urination) *BOWEL PROBLEMS (unusual diarrhea, constipation, pain near the anus) TENDERNESS IN MOUTH AND THROAT WITH OR WITHOUT PRESENCE OF ULCERS (sore throat, sores in mouth, or a toothache) UNUSUAL RASH, SWELLING OR PAIN  UNUSUAL VAGINAL DISCHARGE OR ITCHING   Items with * indicate a potential emergency and should be followed up as soon as possible or  go to the Emergency Department if any problems should occur.  Please show the CHEMOTHERAPY ALERT CARD or IMMUNOTHERAPY ALERT CARD at check-in to the Emergency Department and triage nurse.  Should you have questions after your visit or need to cancel or reschedule your appointment, please contact [facility] [place] - A DEPT OF [facility] [phone]  and follow the prompts.  Office hours are 8:00 a.m. to 4:30 p.m. Monday - Friday. Please note that voicemails left after 4:00 p.m. may not be returned until the following business day.  We are closed weekends and major holidays. You have access to a nurse at all times for urgent questions. Please call the main number to the clinic [phone] and follow the prompts.  For any non-urgent questions, you may also contact your provider using MyChart. We now offer e-Visits for anyone 75 and older to request care online for non-urgent symptoms. For details visit [email or web address].   Also download the MyChart app! Go to the app store, search MyChart, open the app, select [facility], and log in with your MyChart username and password.

## 2024-01-22 DIAGNOSIS — Z9181 History of falling: Secondary | ICD-10-CM | POA: Diagnosis not present

## 2024-01-22 DIAGNOSIS — M6281 Muscle weakness (generalized): Secondary | ICD-10-CM | POA: Diagnosis not present

## 2024-01-22 DIAGNOSIS — M4802 Spinal stenosis, cervical region: Secondary | ICD-10-CM | POA: Diagnosis not present

## 2024-01-22 DIAGNOSIS — M810 Age-related osteoporosis without current pathological fracture: Secondary | ICD-10-CM | POA: Diagnosis not present

## 2024-01-22 DIAGNOSIS — R279 Unspecified lack of coordination: Secondary | ICD-10-CM | POA: Diagnosis not present

## 2024-01-24 DIAGNOSIS — M810 Age-related osteoporosis without current pathological fracture: Secondary | ICD-10-CM | POA: Diagnosis not present

## 2024-01-24 DIAGNOSIS — M4802 Spinal stenosis, cervical region: Secondary | ICD-10-CM | POA: Diagnosis not present

## 2024-01-24 DIAGNOSIS — Z9181 History of falling: Secondary | ICD-10-CM | POA: Diagnosis not present

## 2024-01-24 DIAGNOSIS — M6281 Muscle weakness (generalized): Secondary | ICD-10-CM | POA: Diagnosis not present

## 2024-01-24 DIAGNOSIS — R279 Unspecified lack of coordination: Secondary | ICD-10-CM | POA: Diagnosis not present

## 2024-01-25 DIAGNOSIS — Z9181 History of falling: Secondary | ICD-10-CM | POA: Diagnosis not present

## 2024-01-25 DIAGNOSIS — M810 Age-related osteoporosis without current pathological fracture: Secondary | ICD-10-CM | POA: Diagnosis not present

## 2024-01-25 DIAGNOSIS — M6281 Muscle weakness (generalized): Secondary | ICD-10-CM | POA: Diagnosis not present

## 2024-01-25 DIAGNOSIS — M4802 Spinal stenosis, cervical region: Secondary | ICD-10-CM | POA: Diagnosis not present

## 2024-01-25 DIAGNOSIS — R279 Unspecified lack of coordination: Secondary | ICD-10-CM | POA: Diagnosis not present

## 2024-01-26 DIAGNOSIS — M4802 Spinal stenosis, cervical region: Secondary | ICD-10-CM | POA: Diagnosis not present

## 2024-01-26 DIAGNOSIS — R279 Unspecified lack of coordination: Secondary | ICD-10-CM | POA: Diagnosis not present

## 2024-01-26 DIAGNOSIS — Z9181 History of falling: Secondary | ICD-10-CM | POA: Diagnosis not present

## 2024-01-26 DIAGNOSIS — M810 Age-related osteoporosis without current pathological fracture: Secondary | ICD-10-CM | POA: Diagnosis not present

## 2024-01-26 DIAGNOSIS — M6281 Muscle weakness (generalized): Secondary | ICD-10-CM | POA: Diagnosis not present

## 2024-01-27 DIAGNOSIS — Z9181 History of falling: Secondary | ICD-10-CM | POA: Diagnosis not present

## 2024-01-27 DIAGNOSIS — M6281 Muscle weakness (generalized): Secondary | ICD-10-CM | POA: Diagnosis not present

## 2024-01-27 DIAGNOSIS — R279 Unspecified lack of coordination: Secondary | ICD-10-CM | POA: Diagnosis not present

## 2024-01-27 DIAGNOSIS — M810 Age-related osteoporosis without current pathological fracture: Secondary | ICD-10-CM | POA: Diagnosis not present

## 2024-01-27 DIAGNOSIS — M4802 Spinal stenosis, cervical region: Secondary | ICD-10-CM | POA: Diagnosis not present

## 2024-01-28 DIAGNOSIS — R279 Unspecified lack of coordination: Secondary | ICD-10-CM | POA: Diagnosis not present

## 2024-01-28 DIAGNOSIS — M810 Age-related osteoporosis without current pathological fracture: Secondary | ICD-10-CM | POA: Diagnosis not present

## 2024-01-28 DIAGNOSIS — Z9181 History of falling: Secondary | ICD-10-CM | POA: Diagnosis not present

## 2024-01-28 DIAGNOSIS — M4802 Spinal stenosis, cervical region: Secondary | ICD-10-CM | POA: Diagnosis not present

## 2024-01-28 DIAGNOSIS — M6281 Muscle weakness (generalized): Secondary | ICD-10-CM | POA: Diagnosis not present

## 2024-02-01 DIAGNOSIS — R279 Unspecified lack of coordination: Secondary | ICD-10-CM | POA: Diagnosis not present

## 2024-02-01 DIAGNOSIS — Z9181 History of falling: Secondary | ICD-10-CM | POA: Diagnosis not present

## 2024-02-01 DIAGNOSIS — M4802 Spinal stenosis, cervical region: Secondary | ICD-10-CM | POA: Diagnosis not present

## 2024-02-01 DIAGNOSIS — M6281 Muscle weakness (generalized): Secondary | ICD-10-CM | POA: Diagnosis not present

## 2024-02-01 DIAGNOSIS — M810 Age-related osteoporosis without current pathological fracture: Secondary | ICD-10-CM | POA: Diagnosis not present

## 2024-02-02 ENCOUNTER — Non-Acute Institutional Stay (SKILLED_NURSING_FACILITY): Admitting: Internal Medicine

## 2024-02-02 ENCOUNTER — Encounter: Payer: Self-pay | Admitting: Internal Medicine

## 2024-02-02 DIAGNOSIS — I10 Essential (primary) hypertension: Secondary | ICD-10-CM

## 2024-02-02 DIAGNOSIS — E441 Mild protein-calorie malnutrition: Secondary | ICD-10-CM | POA: Diagnosis not present

## 2024-02-02 DIAGNOSIS — M4802 Spinal stenosis, cervical region: Secondary | ICD-10-CM | POA: Diagnosis not present

## 2024-02-02 DIAGNOSIS — Z9181 History of falling: Secondary | ICD-10-CM | POA: Diagnosis not present

## 2024-02-02 DIAGNOSIS — N1832 Chronic kidney disease, stage 3b: Secondary | ICD-10-CM

## 2024-02-02 DIAGNOSIS — D631 Anemia in chronic kidney disease: Secondary | ICD-10-CM | POA: Diagnosis not present

## 2024-02-02 DIAGNOSIS — R279 Unspecified lack of coordination: Secondary | ICD-10-CM | POA: Diagnosis not present

## 2024-02-02 DIAGNOSIS — M6281 Muscle weakness (generalized): Secondary | ICD-10-CM | POA: Diagnosis not present

## 2024-02-02 DIAGNOSIS — M810 Age-related osteoporosis without current pathological fracture: Secondary | ICD-10-CM | POA: Diagnosis not present

## 2024-02-02 NOTE — Assessment & Plan Note (Signed)
 Anemia is fairly stable in the context of Retacrit  infusions.  There has been progression of her CKD with current creatinine of 1.61 and GFR of 32 indicating low stage IIIb CKD.  Med list reviewed; no need for change in meds or dosages at this time.

## 2024-02-02 NOTE — Assessment & Plan Note (Signed)
 The blood pressure of 109/78 is an outlier.  Typically blood pressure is very well-controlled with systolics in the 120s with rare documentation of systolic blood pressure greater than 140.  Continue to monitor without change in medicines.

## 2024-02-02 NOTE — Assessment & Plan Note (Signed)
 Current albumin is 3.9 and total protein 7.6, both within normal limits.  Nutritionist continues to follow her at the SNF.

## 2024-02-02 NOTE — Progress Notes (Unsigned)
 NURSING HOME LOCATION:  [facility] ROOM NUMBER:[id number] P    CODE STATUS:  DNR  PCP: Traci Barnie RAMAN, NP   This is a nursing facility follow up visit of chronic medical diagnoses to document compliance with Regulation 483.30 (c) in The Long Term Care Survey Manual Phase 2 which mandates caregiver visit ( visits can alternate among physician, PA or NP as per statutes) within 10 days of 30 days / 60 days/ 90 days post admission to SNF date  .  Interim medical record and care since last SNF visit was updated with review of diagnostic studies and change in clinical status since last visit were documented.  HPI: She is a permanent resident of this facility with medical diagnoses of B12 deficiency anemia, history of asthma, COPD, diabetes with peripheral neuropathy and vascular complications, history of stroke, CKD, GERD, glaucoma, dyslipidemia, hypertension, and osteoporosis. Labs are current as of 01/21/2024; creatinine has risen from 1.51 up to 1.61.  Over the last year creatinine has ranged from a low of 1.26 up to the present value of 1.61.  The current GFR is 32 which represents nadir value; GFR was 44 on 02/23/2023.  CKD is now stage IIIb. Her anemia attributed to CKD has been relatively stable with current H/H of 10.8/35.3.  Iron  is 27; ferritin is 319.  Indices are normochromic, normocytic.  She is receiving Retacrit  10,000 units infusions @ the [facility]. Mild hyperglycemia was noted; but A1c values have been prediabetic with a range 4.9-5.4.  Review of systems: She denies any new issues.  She has chronic cold intolerance as well as peripheral neuropathy in the hands and feet which is unchanged.  She is going to have a scan, removed by laser from the right eye in the near future.  Constitutional: No fever, significant weight change, fatigue  Eyes: No redness, discharge, pain, vision change ENT/mouth: No nasal congestion,  purulent  discharge, earache, change in hearing, sore throat  Cardiovascular: No chest pain, palpitations, paroxysmal nocturnal dyspnea, claudication, edema  Respiratory: No cough, sputum production, hemoptysis, DOE, significant snoring, apnea   Gastrointestinal: No heartburn, dysphagia, abdominal pain, nausea /vomiting, rectal bleeding, melena, change in bowels Genitourinary: No dysuria, hematuria, pyuria, incontinence, nocturia Musculoskeletal: No joint stiffness, joint swelling, weakness, pain Dermatologic: No rash, pruritus, change in appearance of skin Neurologic: No dizziness, headache, syncope, seizures, numbness, tingling Psychiatric: No significant anxiety, depression, insomnia, anorexia Endocrine: No change in hair/skin/nails, excessive thirst, excessive hunger, excessive urination  Hematologic/lymphatic: No significant bruising, lymphadenopathy, abnormal bleeding Allergy/immunology: No itchy/watery eyes, significant sneezing, urticaria, angioedema  Physical exam:  Pertinent or positive findings: As is always the case she is willing I will 1 jacket, mittens, and has a blanket over her lower body.  There is splotchy faint hyperpigmentation and suggestion of vitiligo over the facies.  She is edentulous.  There is slight splitting of the second heart sound.  Pedal pulses are not palpable.  Limbs are atrophic.  General appearance: Adequately nourished; no acute distress, increased work of breathing is present.   Lymphatic: No lymphadenopathy about the head, neck, axilla. Eyes: No conjunctival inflammation or lid edema is present. There is no scleral icterus. Ears:  External ear exam shows no significant lesions or deformities.   Nose:  External nasal examination shows no deformity or inflammation. Nasal mucosa are pink and moist without lesions, exudates Oral exam:  Lips and gums are healthy appearing. There is no oropharyngeal erythema or exudate.  Neck:  No thyromegaly, masses, tenderness noted.     Heart:  Normal rate and regular rhythm. S1 and S2 normal without gallop, murmur, click, rub .  Lungs: Chest clear to auscultation without wheezes, rhonchi, rales, rubs. Abdomen: Bowel sounds are normal. Abdomen is soft and nontender with no organomegaly, hernias, masses. GU: Deferred  Extremities:  No cyanosis, clubbing, edema  Neurologic exam : Cn 2-7 intact Strength equal  in upper & lower extremities Balance, Rhomberg, finger to nose testing could not be completed due to clinical state Deep tendon reflexes are equal Skin: Warm & dry w/o tenting. No significant lesions or rash.  See summary under each active problem in the Problem List with associated updated therapeutic plan

## 2024-02-02 NOTE — Patient Instructions (Signed)
 See assessment and plan under each diagnosis in the problem list and acutely for this visit

## 2024-02-03 ENCOUNTER — Encounter: Payer: Self-pay | Admitting: Pharmacist

## 2024-02-03 DIAGNOSIS — M810 Age-related osteoporosis without current pathological fracture: Secondary | ICD-10-CM | POA: Diagnosis not present

## 2024-02-03 DIAGNOSIS — M6281 Muscle weakness (generalized): Secondary | ICD-10-CM | POA: Diagnosis not present

## 2024-02-03 DIAGNOSIS — R279 Unspecified lack of coordination: Secondary | ICD-10-CM | POA: Diagnosis not present

## 2024-02-03 DIAGNOSIS — Z9181 History of falling: Secondary | ICD-10-CM | POA: Diagnosis not present

## 2024-02-03 DIAGNOSIS — M4802 Spinal stenosis, cervical region: Secondary | ICD-10-CM | POA: Diagnosis not present

## 2024-02-03 NOTE — Progress Notes (Signed)
   02/03/2024  Patient ID: Traci Parker, female   DOB: 07-05-1946, 78 y.o.   MRN: [id number]  Pharmacy Quality Measure Review  This patient is appearing on a report for being at risk of failing the adherence measure for cholesterol (statin) and hypertension (ACEi/ARB) medications this calendar year.   Medication: Losartan and Rosuvastatin  Last fill date: 01/04/24 for 30 day supplies  Meds are filled at [facility] they fill 7 day cycle packs. They also do composite billing one month behind.  [name], PharmD, BCACP Clinical Pharmacist [phone]

## 2024-02-04 ENCOUNTER — Non-Acute Institutional Stay (SKILLED_NURSING_FACILITY): Admitting: Adult Health

## 2024-02-04 ENCOUNTER — Encounter: Payer: Self-pay | Admitting: Adult Health

## 2024-02-04 DIAGNOSIS — J449 Chronic obstructive pulmonary disease, unspecified: Secondary | ICD-10-CM

## 2024-02-04 DIAGNOSIS — R279 Unspecified lack of coordination: Secondary | ICD-10-CM | POA: Diagnosis not present

## 2024-02-04 DIAGNOSIS — M6281 Muscle weakness (generalized): Secondary | ICD-10-CM | POA: Diagnosis not present

## 2024-02-04 DIAGNOSIS — M4802 Spinal stenosis, cervical region: Secondary | ICD-10-CM | POA: Diagnosis not present

## 2024-02-04 DIAGNOSIS — E069 Thyroiditis, unspecified: Secondary | ICD-10-CM

## 2024-02-04 DIAGNOSIS — I700 Atherosclerosis of aorta: Secondary | ICD-10-CM | POA: Diagnosis not present

## 2024-02-04 DIAGNOSIS — Z9181 History of falling: Secondary | ICD-10-CM | POA: Diagnosis not present

## 2024-02-04 DIAGNOSIS — M810 Age-related osteoporosis without current pathological fracture: Secondary | ICD-10-CM | POA: Diagnosis not present

## 2024-02-04 NOTE — Progress Notes (Signed)
 Location:  [facility] Room Number: 150 Place of Service:  SNF (31)   CODE STATUS: dnr  Allergies  Allergen Reactions   Latex Rash   Levofloxacin Nausea And Vomiting   Neomycin-Bacitracin-Polymyxin  [Bacitracin-Neomycin-Polymyxin] Rash   Other Swelling and Rash    TOMATO  SOUP-PER MAR   Prednisone Hives, Itching, Rash and Other (See Comments)   Fish Allergy     rash   Shellfish Allergy Hives   Neosporin [Neomycin-Polymyxin-Gramicidin] Itching and Rash    Chief Complaint  Patient presents with   Acute Visit    Care plan meeting     HPI:  We have come together for her care plan meeting. BIMS 15/15 mood 0/30. Uses wheelchair without falls. She requires moderate to dependent assist with her adl care. She is incontinent of bladder and bowel. Dietary: regular diet setup for meals; appetite good; weight is 136.8 pounds up 6.2 % in the past 6 months. Therapy: PT: ambulate 60 feet with walker and min assist. Stand to chair contact guard assist; sit to stand max assist. Activities: does participate. She will continue to be followed for her chronic illnesses including: Aortic atherosclerosis  Chronic obstructive pulmonary disease unspecified COPD type   Thyroiditis  Past Medical History:  Diagnosis Date   Allergy    Anemia    pernicious anemia   Arthritis    Asthma    Blood transfusion without reported diagnosis    CKD (chronic kidney disease)    COPD (chronic obstructive pulmonary disease) (HCC) 12/23/2019   Diabetic peripheral neuropathy associated with type 2 diabetes mellitus (HCC) 04/07/2021   DM (diabetes mellitus), type 2 with peripheral vascular complications (HCC) 12/23/2019   Gastritis    GERD (gastroesophageal reflux disease)    Glaucoma    History of blood in urine    Hyperlipidemia    Hypertension    Neuropathy    both feet   Ocular hypertension    Osteoporosis    Stroke (HCC)    Vitamin B12 deficiency anemia due to intrinsic factor  deficiency     Past Surgical History:  Procedure Laterality Date   CATARACT EXTRACTION W/PHACO Right 11/27/2022   Procedure: CATARACT EXTRACTION PHACO AND INTRAOCULAR LENS PLACEMENT (IOC);  Surgeon: Harrie Agent, MD;  Location: [facility];  Service: Ophthalmology;  Laterality: Right;  CDE: 15.67   CATARACT EXTRACTION W/PHACO Left 12/18/2022   Procedure: CATARACT EXTRACTION PHACO AND INTRAOCULAR LENS PLACEMENT (IOC);  Surgeon: Harrie Agent, MD;  Location: [facility];  Service: Ophthalmology;  Laterality: Left;  CDE: 11.78   CHOLECYSTECTOMY  2008   COLONOSCOPY  04/30/2011   Procedure: COLONOSCOPY;  Surgeon: Claudis RAYMOND Rivet, MD;  [facility]: [facility];  Service: Endoscopy;  Laterality: N/A;   COLONOSCOPY  05/25/2012   Procedure: COLONOSCOPY;  Surgeon: Claudis RAYMOND Rivet, MD;  [facility];  Service: Endoscopy;  Laterality: N/A;  1:25-changed to 1200 Ann to notify pt   COLONOSCOPY Bilateral 12/2017   COLONOSCOPY WITH PROPOFOL  N/A 11/29/2020   Procedure: COLONOSCOPY WITH PROPOFOL ;  Surgeon: Eartha Angelia Sieving, MD;  [facility]: [facility];  Service: Gastroenterology;  Laterality: N/A;  1:35 [facility] in the [facility]   ESOPHAGOGASTRODUODENOSCOPY N/A 12/07/2019   Procedure: ESOPHAGOGASTRODUODENOSCOPY (EGD);  Surgeon: Rivet Claudis RAYMOND, MD;  [facility];  Service: Endoscopy;  Laterality: N/A;  155   GALLBLADDER SURGERY     right breast cystectomy  1988   TUBAL LIGATION  1975    Social History  Socioeconomic History   Marital status: Divorced    Spouse name: Not on file   Number of children: 4   Years of education: 9   Highest education level: Not on file  Occupational History   Occupation: retired   Tobacco Use   Smoking status: Former    Current packs/day: 0.00    Average packs/day: 1 pack/day for 40.0 years (40.0 ttl pk-yrs)    Types: Cigarettes    Start date: 04/03/1971    Quit date: 04/03/2011    Years since quitting: 12.8   Smokeless tobacco: Never   Vaping Use   Vaping status: Never Used  Substance and Sexual Activity   Alcohol use: No   Drug use: No   Sexual activity: Not Currently  Other Topics Concern   Not on file  Social History Narrative   01/17/20 lives at [facility], [place]   Social Drivers of Health   Financial Resource Strain: Not on file  Food Insecurity: Not on file  Transportation Needs: Not on file  Physical Activity: Not on file  Stress: Not on file  Social Connections: Not on file  Intimate Partner Violence: Not on file   Family History  Problem Relation Age of Onset   Diabetes Mother    Hyperlipidemia Mother    Hypertension Mother    Heart disease Father    Hyperlipidemia Father    Diabetes Sister    Hyperlipidemia Sister    Hypertension Sister    Cancer Brother    Heart disease Brother    Hyperlipidemia Brother    Hypertension Brother    Diabetes Sister       VITAL SIGNS BP 109/70   Pulse 72   Temp 98.4 F (36.9 C)   Resp 18   Ht 5' 5 (1.651 m)   Wt 136 lb 12.8 oz (62.1 kg)   SpO2 98%   BMI 22.76 kg/m   Outpatient Encounter Medications as of 02/04/2024  Medication Sig   amLODipine  (NORVASC ) 5 MG tablet Take 5 mg by mouth daily.   aspirin EC 81 MG tablet Take 81 mg by mouth daily. Swallow whole.   Calcium  Carb-Cholecalciferol (CALCIUM  500 + D) 500-5 MG-MCG TABS Take 1 tablet by mouth daily.   cyanocobalamin  1000 MCG tablet Take 1,000 mcg by mouth 2 (two) times a week.   folic acid  (V-R FOLIC ACID ) 400 MCG tablet Take 1 tablet (400 mcg total) by mouth every other day.   gabapentin  (NEURONTIN ) 100 MG capsule Take 200 mg by mouth at bedtime. (2100)   gabapentin  (NEURONTIN ) 100 MG capsule Take 100 mg by mouth daily.   losartan (COZAAR) 25 MG tablet Take 25 mg by mouth daily.   NON FORMULARY Diet:Regular thin liquids  ALLERGIC TO FISH   omeprazole  (PRILOSEC) 40 MG capsule Take 40 mg by mouth daily at 6 (six) AM. (0600)   rosuvastatin (CRESTOR) 20 MG tablet Take 20 mg by mouth  daily.   senna-docusate (SENOKOT S) 8.6-50 MG tablet Take 1 tablet by mouth 2 (two) times daily.   sodium chloride  1 g tablet Take 1 g by mouth 2 (two) times daily with a meal.   No facility-administered encounter medications on file as of 02/04/2024.     SIGNIFICANT DIAGNOSTIC EXAMS  LABS REVIEWED PREVIOUS     01-27-23: wbc 7.2; hgb 11.3; hct 36.1; mcv 91.6 plt 202 03-24-23: wbc 6.2; hgb 9.8; hct 31.5; mcv 93.5; plt 235; glucose 131; bun 18; creat 1.44; k+ 4.7; na++ 134; ca  8.4; gfr 37; protein 6.9; albumin 3.7  vitamin B12: 1068; iron  33; tibc 173 04-23-23: wbc 7.4; hgb 10.4; hct 34.0; mcv 95.8 plt 143 05-20-23: tsh 2.925 05-21-23: wbc 5.7; hgb 10.4; hct 34.0; mcv 95.2 plt 149    06-18-23: wbc 6.8; hgb 11.1; hct 36.1; mcv 95.3 plt 192; glucose 154; bun 18; creat 1.36; k+ 5.1; na++ 134; ca 9.3; gfr 40 protein 7.4; albumin 4.1 07-14-23: wbc 4.8; hgb 9.9; hct 31.0; mcv 92.0 plt 144; glucose 117; bun 18; creat 1.39; k+ 4.6; na++ 134; ca 9.0; gfr 39; d-dimer 1.18; CRP 0.8 08-13-23: wbc 6.7; hgb 10.2; hct 33.6; mcv 93.6 plt 141  09-24-23: tsh 2.020 free t3: 1.8 free t4: 0.98 10-08-23: wbc 5.7; hgb 9.4; hct 31.2; mcv 95.7 plt 178; protein 6.7 albumin 3.5 vitamin D  44.18 10-28-23: wbc 8.3; hgb 10.5; hct 34.7 mcv 94.8 plt 158  NO NEW LABS.    Review of Systems  Constitutional:  Negative for malaise/fatigue.  Respiratory:  Negative for cough and shortness of breath.   Cardiovascular:  Negative for chest pain, palpitations and leg swelling.  Gastrointestinal:  Negative for abdominal pain, constipation and heartburn.  Musculoskeletal:  Negative for back pain, joint pain and myalgias.  Skin: Negative.   Neurological:  Negative for dizziness.  Psychiatric/Behavioral:  The patient is not nervous/anxious.    Physical Exam Constitutional:      General: She is not in acute distress.    Appearance: She is well-developed. She is not diaphoretic.  Neck:     Thyroid : No thyromegaly.  Cardiovascular:      Rate and Rhythm: Normal rate and regular rhythm.     Heart sounds: Normal heart sounds.  Pulmonary:     Effort: Pulmonary effort is normal. No respiratory distress.     Breath sounds: Normal breath sounds.  Abdominal:     General: Bowel sounds are normal. There is no distension.     Palpations: Abdomen is soft.     Tenderness: There is no abdominal tenderness.  Musculoskeletal:        General: Normal range of motion.     Cervical back: Neck supple.     Right lower leg: No edema.     Left lower leg: No edema.  Lymphadenopathy:     Cervical: No cervical adenopathy.  Skin:    General: Skin is warm and dry.  Neurological:     Mental Status: She is alert. Mental status is at baseline.  Psychiatric:        Mood and Affect: Mood normal.      ASSESSMENT/ PLAN:  TODAY  Aortic atherosclerosis Chronic obstructive pulmonary disease unspecified COPD type Thyroiditis  Will continue current medications Will continue current plan of care Will continue to monitor her status.   Time spent with patient: 40 minutes: medications; therapy; dietary    Barnie Seip NP [facility]  call [phone]

## 2024-02-05 DIAGNOSIS — Z9181 History of falling: Secondary | ICD-10-CM | POA: Diagnosis not present

## 2024-02-05 DIAGNOSIS — M810 Age-related osteoporosis without current pathological fracture: Secondary | ICD-10-CM | POA: Diagnosis not present

## 2024-02-05 DIAGNOSIS — M4802 Spinal stenosis, cervical region: Secondary | ICD-10-CM | POA: Diagnosis not present

## 2024-02-05 DIAGNOSIS — R279 Unspecified lack of coordination: Secondary | ICD-10-CM | POA: Diagnosis not present

## 2024-02-05 DIAGNOSIS — M6281 Muscle weakness (generalized): Secondary | ICD-10-CM | POA: Diagnosis not present

## 2024-02-07 DIAGNOSIS — R279 Unspecified lack of coordination: Secondary | ICD-10-CM | POA: Diagnosis not present

## 2024-02-07 DIAGNOSIS — Z9181 History of falling: Secondary | ICD-10-CM | POA: Diagnosis not present

## 2024-02-07 DIAGNOSIS — M4802 Spinal stenosis, cervical region: Secondary | ICD-10-CM | POA: Diagnosis not present

## 2024-02-07 DIAGNOSIS — M810 Age-related osteoporosis without current pathological fracture: Secondary | ICD-10-CM | POA: Diagnosis not present

## 2024-02-07 DIAGNOSIS — M6281 Muscle weakness (generalized): Secondary | ICD-10-CM | POA: Diagnosis not present

## 2024-02-08 DIAGNOSIS — M4802 Spinal stenosis, cervical region: Secondary | ICD-10-CM | POA: Diagnosis not present

## 2024-02-08 DIAGNOSIS — Z9181 History of falling: Secondary | ICD-10-CM | POA: Diagnosis not present

## 2024-02-08 DIAGNOSIS — M6281 Muscle weakness (generalized): Secondary | ICD-10-CM | POA: Diagnosis not present

## 2024-02-08 DIAGNOSIS — R279 Unspecified lack of coordination: Secondary | ICD-10-CM | POA: Diagnosis not present

## 2024-02-08 DIAGNOSIS — M810 Age-related osteoporosis without current pathological fracture: Secondary | ICD-10-CM | POA: Diagnosis not present

## 2024-02-09 DIAGNOSIS — R279 Unspecified lack of coordination: Secondary | ICD-10-CM | POA: Diagnosis not present

## 2024-02-09 DIAGNOSIS — Z9181 History of falling: Secondary | ICD-10-CM | POA: Diagnosis not present

## 2024-02-09 DIAGNOSIS — M4802 Spinal stenosis, cervical region: Secondary | ICD-10-CM | POA: Diagnosis not present

## 2024-02-09 DIAGNOSIS — M810 Age-related osteoporosis without current pathological fracture: Secondary | ICD-10-CM | POA: Diagnosis not present

## 2024-02-09 DIAGNOSIS — M6281 Muscle weakness (generalized): Secondary | ICD-10-CM | POA: Diagnosis not present

## 2024-02-10 DIAGNOSIS — Z9181 History of falling: Secondary | ICD-10-CM | POA: Diagnosis not present

## 2024-02-10 DIAGNOSIS — M4802 Spinal stenosis, cervical region: Secondary | ICD-10-CM | POA: Diagnosis not present

## 2024-02-10 DIAGNOSIS — M810 Age-related osteoporosis without current pathological fracture: Secondary | ICD-10-CM | POA: Diagnosis not present

## 2024-02-10 DIAGNOSIS — M6281 Muscle weakness (generalized): Secondary | ICD-10-CM | POA: Diagnosis not present

## 2024-02-10 DIAGNOSIS — R279 Unspecified lack of coordination: Secondary | ICD-10-CM | POA: Diagnosis not present

## 2024-02-10 DIAGNOSIS — H26491 Other secondary cataract, right eye: Secondary | ICD-10-CM | POA: Diagnosis not present

## 2024-02-12 DIAGNOSIS — R279 Unspecified lack of coordination: Secondary | ICD-10-CM | POA: Diagnosis not present

## 2024-02-12 DIAGNOSIS — M4802 Spinal stenosis, cervical region: Secondary | ICD-10-CM | POA: Diagnosis not present

## 2024-02-12 DIAGNOSIS — Z9181 History of falling: Secondary | ICD-10-CM | POA: Diagnosis not present

## 2024-02-12 DIAGNOSIS — M6281 Muscle weakness (generalized): Secondary | ICD-10-CM | POA: Diagnosis not present

## 2024-02-14 DIAGNOSIS — M4802 Spinal stenosis, cervical region: Secondary | ICD-10-CM | POA: Diagnosis not present

## 2024-02-14 DIAGNOSIS — R279 Unspecified lack of coordination: Secondary | ICD-10-CM | POA: Diagnosis not present

## 2024-02-14 DIAGNOSIS — Z9181 History of falling: Secondary | ICD-10-CM | POA: Diagnosis not present

## 2024-02-14 DIAGNOSIS — M6281 Muscle weakness (generalized): Secondary | ICD-10-CM | POA: Diagnosis not present

## 2024-02-15 ENCOUNTER — Other Ambulatory Visit (HOSPITAL_COMMUNITY)
Admission: RE | Admit: 2024-02-15 | Discharge: 2024-02-15 | Disposition: A | Discharge status: Home / Self Care | Source: Skilled Nursing Facility | Attending: Adult Health | Admitting: Adult Health

## 2024-02-15 DIAGNOSIS — I1310 Hypertensive heart and chronic kidney disease without heart failure, with stage 1 through stage 4 chronic kidney disease, or unspecified chronic kidney disease: Secondary | ICD-10-CM | POA: Insufficient documentation

## 2024-02-15 DIAGNOSIS — R279 Unspecified lack of coordination: Secondary | ICD-10-CM | POA: Diagnosis not present

## 2024-02-15 DIAGNOSIS — M810 Age-related osteoporosis without current pathological fracture: Secondary | ICD-10-CM | POA: Diagnosis not present

## 2024-02-15 DIAGNOSIS — M6281 Muscle weakness (generalized): Secondary | ICD-10-CM | POA: Diagnosis not present

## 2024-02-15 DIAGNOSIS — M4802 Spinal stenosis, cervical region: Secondary | ICD-10-CM | POA: Diagnosis not present

## 2024-02-15 DIAGNOSIS — Z9181 History of falling: Secondary | ICD-10-CM | POA: Diagnosis not present

## 2024-02-15 LAB — CBC
HCT: 33 % — ABNORMAL LOW (ref 36.0–46.0)
Hemoglobin: 10.1 g/dL — ABNORMAL LOW (ref 12.0–15.0)
MCH: 27.8 pg (ref 26.0–34.0)
MCHC: 30.6 g/dL (ref 30.0–36.0)
MCV: 90.9 fL (ref 80.0–100.0)
Platelets: 187 K/uL (ref 150–400)
RBC: 3.63 MIL/uL — ABNORMAL LOW (ref 3.87–5.11)
RDW: 15.6 % — ABNORMAL HIGH (ref 11.5–15.5)
WBC: 5.8 K/uL (ref 4.0–10.5)
nRBC: 0 % (ref 0.0–0.2)

## 2024-02-15 LAB — RENAL FUNCTION PANEL
Albumin: 3.5 g/dL (ref 3.5–5.0)
Anion gap: 11 (ref 5–15)
BUN: 15 mg/dL (ref 8–23)
CO2: 25 mmol/L (ref 22–32)
Calcium: 8.9 mg/dL (ref 8.9–10.3)
Chloride: 102 mmol/L (ref 98–111)
Creatinine, Ser: 1.47 mg/dL — ABNORMAL HIGH (ref 0.44–1.00)
GFR, Estimated: 36 mL/min — ABNORMAL LOW (ref >60)
Glucose, Bld: 88 mg/dL (ref 70–99)
Phosphorus: 4 mg/dL (ref 2.5–4.6)
Potassium: 4.9 mmol/L (ref 3.5–5.1)
Sodium: 138 mmol/L (ref 135–145)

## 2024-02-15 LAB — PROTEIN / CREATININE RATIO, URINE
Creatinine, Urine: 56 mg/dL
Protein Creatinine Ratio: 0.14 mg/mg{creat} (ref 0.00–0.15)
Total Protein, Urine: 8 mg/dL

## 2024-02-16 DIAGNOSIS — M4802 Spinal stenosis, cervical region: Secondary | ICD-10-CM | POA: Diagnosis not present

## 2024-02-16 DIAGNOSIS — R279 Unspecified lack of coordination: Secondary | ICD-10-CM | POA: Diagnosis not present

## 2024-02-16 DIAGNOSIS — Z9181 History of falling: Secondary | ICD-10-CM | POA: Diagnosis not present

## 2024-02-16 DIAGNOSIS — M6281 Muscle weakness (generalized): Secondary | ICD-10-CM | POA: Diagnosis not present

## 2024-02-17 DIAGNOSIS — M6281 Muscle weakness (generalized): Secondary | ICD-10-CM | POA: Diagnosis not present

## 2024-02-17 DIAGNOSIS — Z9181 History of falling: Secondary | ICD-10-CM | POA: Diagnosis not present

## 2024-02-17 DIAGNOSIS — R279 Unspecified lack of coordination: Secondary | ICD-10-CM | POA: Diagnosis not present

## 2024-02-17 DIAGNOSIS — M4802 Spinal stenosis, cervical region: Secondary | ICD-10-CM | POA: Diagnosis not present

## 2024-02-18 DIAGNOSIS — M4802 Spinal stenosis, cervical region: Secondary | ICD-10-CM | POA: Diagnosis not present

## 2024-02-18 DIAGNOSIS — M810 Age-related osteoporosis without current pathological fracture: Secondary | ICD-10-CM | POA: Diagnosis not present

## 2024-02-18 DIAGNOSIS — Z9181 History of falling: Secondary | ICD-10-CM | POA: Diagnosis not present

## 2024-02-18 DIAGNOSIS — R279 Unspecified lack of coordination: Secondary | ICD-10-CM | POA: Diagnosis not present

## 2024-02-18 DIAGNOSIS — M6281 Muscle weakness (generalized): Secondary | ICD-10-CM | POA: Diagnosis not present

## 2024-02-24 ENCOUNTER — Encounter: Payer: Self-pay | Admitting: Adult Health

## 2024-02-24 ENCOUNTER — Non-Acute Institutional Stay (SKILLED_NURSING_FACILITY): Admitting: Adult Health

## 2024-02-24 DIAGNOSIS — E559 Vitamin D deficiency, unspecified: Secondary | ICD-10-CM

## 2024-02-24 DIAGNOSIS — H43822 Vitreomacular adhesion, left eye: Secondary | ICD-10-CM | POA: Diagnosis not present

## 2024-02-24 DIAGNOSIS — E538 Deficiency of other specified B group vitamins: Secondary | ICD-10-CM

## 2024-02-24 DIAGNOSIS — E069 Thyroiditis, unspecified: Secondary | ICD-10-CM

## 2024-02-24 NOTE — Progress Notes (Signed)
 Location:  [facility] Room Number: 150-P Place of Service:  SNF (31)   CODE STATUS: dnr   Allergies  Allergen Reactions   Latex Rash   Levofloxacin Nausea And Vomiting   Neomycin-Bacitracin-Polymyxin  [Bacitracin-Neomycin-Polymyxin] Rash   Other Swelling and Rash    TOMATO  SOUP-PER MAR   Prednisone Hives, Itching, Rash and Other (See Comments)   Fish Allergy     rash   Shellfish Allergy Hives   Neosporin [Neomycin-Polymyxin-Gramicidin] Itching and Rash    Chief Complaint  Patient presents with   Medical Management of Chronic Issues         Vitamin B12 deficiency:    Vitamin D  deficiency:    Thyroiditis:    HPI:  She is a 78 y.o. long term resident of this facility being seen for the management of her chronic illnesses: Vitamin B12 deficiency:    Vitamin D  deficiency:    Thyroiditis. There are no reports of uncontrolled pain. Her weight is stable.    Past Medical History:  Diagnosis Date   Allergy    Anemia    pernicious anemia   Arthritis    Asthma    Blood transfusion without reported diagnosis    CKD (chronic kidney disease)    COPD (chronic obstructive pulmonary disease) (HCC) 12/23/2019   Diabetic peripheral neuropathy associated with type 2 diabetes mellitus (HCC) 04/07/2021   DM (diabetes mellitus), type 2 with peripheral vascular complications (HCC) 12/23/2019   Gastritis    GERD (gastroesophageal reflux disease)    Glaucoma    History of blood in urine    Hyperlipidemia    Hypertension    Neuropathy    both feet   Ocular hypertension    Osteoporosis    Stroke (HCC)    Vitamin B12 deficiency anemia due to intrinsic factor deficiency     Past Surgical History:  Procedure Laterality Date   CATARACT EXTRACTION W/PHACO Right 11/27/2022   Procedure: CATARACT EXTRACTION PHACO AND INTRAOCULAR LENS PLACEMENT (IOC);  Surgeon: Harrie Agent, MD;  Location: [facility];  Service: Ophthalmology;  Laterality: Right;  CDE: 15.67   CATARACT  EXTRACTION W/PHACO Left 12/18/2022   Procedure: CATARACT EXTRACTION PHACO AND INTRAOCULAR LENS PLACEMENT (IOC);  Surgeon: Harrie Agent, MD;  Location: [facility];  Service: Ophthalmology;  Laterality: Left;  CDE: 11.78   CHOLECYSTECTOMY  2008   COLONOSCOPY  04/30/2011   Procedure: COLONOSCOPY;  Surgeon: Claudis RAYMOND Rivet, MD;  [facility];  Service: Endoscopy;  Laterality: N/A;   COLONOSCOPY  05/25/2012   Procedure: COLONOSCOPY;  Surgeon: Claudis RAYMOND Rivet, MD;  [facility];  Service: Endoscopy;  Laterality: N/A;  1:25-changed to 1200 Ann to notify pt   COLONOSCOPY Bilateral 12/2017   COLONOSCOPY WITH PROPOFOL  N/A 11/29/2020   Procedure: COLONOSCOPY WITH PROPOFOL ;  Surgeon: Eartha Angelia Sieving, MD;  [facility];  Service: Gastroenterology;  Laterality: N/A;  1:35 [facility] is in the [facility]   ESOPHAGOGASTRODUODENOSCOPY N/A 12/07/2019   Procedure: ESOPHAGOGASTRODUODENOSCOPY (EGD);  Surgeon: Rivet Claudis RAYMOND, MD;  [facility];  Service: Endoscopy;  Laterality: N/A;  155   GALLBLADDER SURGERY     right breast cystectomy  1988   TUBAL LIGATION  1975    Social History   Socioeconomic History   Marital status: Divorced    Spouse name: Not on file   Number of children: 4   Years of education: 9   Highest education level: Not on file  Occupational History   Occupation: retired   Tobacco Use   Smoking status: Former    Current packs/day: 0.00    Average packs/day: 1 pack/day for 40.0 years (40.0 ttl pk-yrs)    Types: Cigarettes    Start date: 04/03/1971    Quit date: 04/03/2011    Years since quitting: 12.9   Smokeless tobacco: Never  Vaping Use   Vaping status: Never Used  Substance and Sexual Activity   Alcohol use: No   Drug use: No   Sexual activity: Not Currently  Other Topics Concern   Not on file  Social History Narrative   01/17/20 lives at [facility], [place]   Social Drivers of Health   Financial Resource Strain: Not  on file  Food Insecurity: Not on file  Transportation Needs: Not on file  Physical Activity: Not on file  Stress: Not on file  Social Connections: Not on file  Intimate Partner Violence: Not on file   Family History  Problem Relation Age of Onset   Diabetes Mother    Hyperlipidemia Mother    Hypertension Mother    Heart disease Father    Hyperlipidemia Father    Diabetes Sister    Hyperlipidemia Sister    Hypertension Sister    Cancer Brother    Heart disease Brother    Hyperlipidemia Brother    Hypertension Brother    Diabetes Sister       VITAL SIGNS BP 129/69   Pulse 64   Temp 98.7 F (37.1 C)   Ht 5' 5 (1.651 m)   Wt 136 lb (61.7 kg)   SpO2 95%   BMI 22.63 kg/m   Outpatient Encounter Medications as of 02/24/2024  Medication Sig   amLODipine (NORVASC) 5 MG tablet Take 5 mg by mouth daily.   aspirin EC 81 MG tablet Take 81 mg by mouth daily. Swallow whole.   Calcium Carb-Cholecalciferol (CALCIUM 500 + D) 500-5 MG-MCG TABS Take 1 tablet by mouth daily.   cyanocobalamin 1000 MCG tablet Take 1,000 mcg by mouth 2 (two) times a week.   ferrous sulfate 325 (65 FE) MG EC tablet Take 325 mg by mouth daily.   folic acid (V-R FOLIC ACID) 400 MCG tablet Take 1 tablet (400 mcg total) by mouth every other day.   gabapentin (NEURONTIN) 100 MG capsule Take 100 mg by mouth 2 (two) times daily. (2100)   losartan (COZAAR) 25 MG tablet Take 25 mg by mouth daily.   NON FORMULARY Diet:Regular thin liquids  ALLERGIC TO FISH   omeprazole (PRILOSEC) 40 MG capsule Take 40 mg by mouth daily at 6 (six) AM. (0600)   polyethylene glycol (MIRALAX / GLYCOLAX) 17 g packet Take 17 g by mouth daily.   Polyvinyl Alcohol-Povidone PF 1.4-0.6 % SOLN Apply 1 drop to eye in the morning and at bedtime.   rosuvastatin (CRESTOR) 20 MG tablet Take 20 mg by mouth daily.   senna-docusate (SENOKOT S) 8.6-50 MG tablet Take 1 tablet by mouth 2 (two) times daily.   sodium chloride 1 g tablet Take 1 g by mouth  2 (two) times daily with a meal.   gabapentin (NEURONTIN) 100 MG capsule Take 100 mg by mouth daily. (Patient not taking: Reported on 02/24/2024)   No facility-administered encounter medications on file as of 02/24/2024.     SIGNIFICANT DIAGNOSTIC EXAMS  LABS REVIEWED PREVIOUS     03-24-23: wbc 6.2; hgb 9.8; hct 31.5; mcv 93.5; plt 235; glucose 131; bun 18; creat  1.44; k+ 4.7; na++ 134; ca 8.4; gfr 37; protein 6.9; albumin 3.7  vitamin B12: 1068; iron  33; tibc 173 04-23-23: wbc 7.4; hgb 10.4; hct 34.0; mcv 95.8 plt 143 05-20-23: tsh 2.925 05-21-23: wbc 5.7; hgb 10.4; hct 34.0; mcv 95.2 plt 149    06-18-23: wbc 6.8; hgb 11.1; hct 36.1; mcv 95.3 plt 192; glucose 154; bun 18; creat 1.36; k+ 5.1; na++ 134; ca 9.3; gfr 40 protein 7.4; albumin 4.1 07-14-23: wbc 4.8; hgb 9.9; hct 31.0; mcv 92.0 plt 144; glucose 117; bun 18; creat 1.39; k+ 4.6; na++ 134; ca 9.0; gfr 39; d-dimer 1.18; CRP 0.8 08-13-23: wbc 6.7; hgb 10.2; hct 33.6; mcv 93.6 plt 141  09-24-23: tsh 2.020 free t3: 1.8 free t4: 0.98 10-08-23: wbc 5.7; hgb 9.4; hct 31.2; mcv 95.7 plt 178; protein 6.7 albumin 3.5 vitamin D  44.18 10-28-23: wbc 8.3; hgb 10.5; hct 34.7 mcv 94.8 plt 158  LABS  01-21-24: wbc 6.9; hgb 10.8; hct 35.3; mcv 91.9 plt 175; glucose 118; bun 16; creat 1.61; k+ 4.5; na++ 139; ca 9.1; gfr 32; protein 7.6 albumin 3.9 iron  27; tibc 183 02-15-24: wbc 5.8; hgb 10.1; hct 33.0; mcv 90.9 plt 187 glucose 88; bun 15; creat 1.47; k+ 4.9; na++ 138; ca 8.9; gfr 36; phos 4.0 albumin 3.5     Review of Systems  Constitutional:  Negative for malaise/fatigue.  Respiratory:  Negative for cough and shortness of breath.   Cardiovascular:  Negative for chest pain, palpitations and leg swelling.  Gastrointestinal:  Negative for abdominal pain, constipation and heartburn.  Musculoskeletal:  Negative for back pain, joint pain and myalgias.  Skin: Negative.   Neurological:  Negative for dizziness.  Psychiatric/Behavioral:  The patient is not  nervous/anxious.     Physical Exam Constitutional:      General: She is not in acute distress.    Appearance: She is well-developed. She is not diaphoretic.  Neck:     Thyroid : No thyromegaly.  Cardiovascular:     Rate and Rhythm: Normal rate and regular rhythm.     Heart sounds: Normal heart sounds.  Pulmonary:     Effort: Pulmonary effort is normal. No respiratory distress.     Breath sounds: Normal breath sounds.  Abdominal:     General: Bowel sounds are normal. There is no distension.     Palpations: Abdomen is soft.     Tenderness: There is no abdominal tenderness.  Musculoskeletal:        General: Normal range of motion.     Cervical back: Neck supple.     Right lower leg: No edema.     Left lower leg: No edema.  Lymphadenopathy:     Cervical: No cervical adenopathy.  Skin:    General: Skin is warm and dry.  Neurological:     Mental Status: She is alert. Mental status is at baseline.  Psychiatric:        Mood and Affect: Mood normal.      ASSESSMENT/ PLAN:  TODAY   Vitamin B12 deficiency: level 1068; folate 32.8 is on folic acid  every other day and vitamin B12: 1000 mcg twice weekly   2. Vitamin D  deficiency: level is 44.18 will monitor   3. Thyroiditis: tsh 2.020 will monitor   PREVIOUS    4. Cervical myelopathy: will monitor is without change   5. Thrombocytopenia: plt 187 will monitor   6. Failure to thrive in adult: is doing well weight is 136 pounds; will continue to monitor  7. Aortic atherosclerosis (ct 12-23-19) will monitor   8. 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 Chronic obstructive pulmonary disease unspecified COPD type: will monitor  10. Hypertension associated with stage 3 chronic kidney disease due to type 2 diabetes mellitus: b/p 129/69: will continue cozaar 25 mg daily   11. CKD stage 3 due to type 2 diabetes mellitus: bun 18; creat 1.39 gfr 39   12. Diabetic  peripheral neuropathy associated with type 2 diabetes mellitus: is presently managed; will continue gabapentin  100 mg in the AM and 200 mg in the PM  13. Type 2 diabetes mellitus with stage 3 chronic kidney disease without long term current use of insulin hgb A1c 5.3 is on asa; statin; arb   14. Hyperlipidemia associated with type 2 diabetes mellitus: ldl 63 will continue crestor 20 mg daily   15. Anemia due to chronic renal failure with erythropoietin  stage 3: hgb 10.1; will continue to monitor is followed by oncology  16. Other chronic gastritis without hemorrhage: will continue prilosec 40 mg daily   17. Increased intraocular pressure bilateral: will continue to monitor      Barnie Seip NP [facility] Adult [facility]   call [phone]

## 2024-03-03 ENCOUNTER — Inpatient Hospital Stay

## 2024-03-03 ENCOUNTER — Inpatient Hospital Stay: Attending: Hematology

## 2024-03-03 VITALS — BP 151/72 | HR 74 | Temp 98.2°F | Resp 18

## 2024-03-03 DIAGNOSIS — N183 Anemia in chronic kidney disease: Secondary | ICD-10-CM

## 2024-03-03 DIAGNOSIS — E538 Deficiency of other specified B group vitamins: Secondary | ICD-10-CM | POA: Diagnosis not present

## 2024-03-03 DIAGNOSIS — D631 Anemia in chronic kidney disease: Secondary | ICD-10-CM | POA: Insufficient documentation

## 2024-03-03 DIAGNOSIS — N1832 Chronic kidney disease, stage 3b: Secondary | ICD-10-CM | POA: Diagnosis not present

## 2024-03-03 LAB — CBC
HCT: 35.4 % — ABNORMAL LOW (ref 36.0–46.0)
Hemoglobin: 10.9 g/dL — ABNORMAL LOW (ref 12.0–15.0)
MCH: 27.9 pg (ref 26.0–34.0)
MCHC: 30.8 g/dL (ref 30.0–36.0)
MCV: 90.5 fL (ref 80.0–100.0)
Platelets: 164 K/uL (ref 150–400)
RBC: 3.91 MIL/uL (ref 3.87–5.11)
RDW: 15.8 % — ABNORMAL HIGH (ref 11.5–15.5)
WBC: 7.4 K/uL (ref 4.0–10.5)
nRBC: 0 % (ref 0.0–0.2)

## 2024-03-03 MED ORDER — EPOETIN ALFA-EPBX 10000 UNIT/ML IJ SOLN
10000.0000 [IU] | Freq: Once | INTRAMUSCULAR | Status: AC
  Administered 2024-03-03: 10000 [IU] via SUBCUTANEOUS
  Filled 2024-03-03: qty 1

## 2024-03-03 NOTE — Patient Instructions (Signed)
 [facility] - A DEPT OF [facility]  Discharge Instructions: Thank you for choosing [facility] to provide your oncology and hematology care.  If you have a lab appointment with the Cancer Center - please note that after April 8th, 2024, all labs will be drawn in the cancer center.  You do not have to check in or register with the main entrance as you have in the past but will complete your check-in in the cancer center.  Wear comfortable clothing and clothing appropriate for easy access to any Portacath or PICC line.   We strive to give you quality time with your provider. You may need to reschedule your appointment if you arrive late (15 or more minutes).  Arriving late affects you and other patients whose appointments are after yours.  Also, if you miss three or more appointments without notifying the office, you may be dismissed from the clinic at the provider's discretion.      For prescription refill requests, have your pharmacy contact our office and allow 72 hours for refills to be completed.    Today you received Retacrit  injection     BELOW ARE SYMPTOMS THAT SHOULD BE REPORTED IMMEDIATELY: *FEVER GREATER THAN 100.4 F (38 C) OR HIGHER *CHILLS OR SWEATING *NAUSEA AND VOMITING THAT IS NOT CONTROLLED WITH YOUR NAUSEA MEDICATION *UNUSUAL SHORTNESS OF BREATH *UNUSUAL BRUISING OR BLEEDING *URINARY PROBLEMS (pain or burning when urinating, or frequent urination) *BOWEL PROBLEMS (unusual diarrhea, constipation, pain near the anus) TENDERNESS IN MOUTH AND THROAT WITH OR WITHOUT PRESENCE OF ULCERS (sore throat, sores in mouth, or a toothache) UNUSUAL RASH, SWELLING OR PAIN  UNUSUAL VAGINAL DISCHARGE OR ITCHING   Items with * indicate a potential emergency and should be followed up as soon as possible or go to the Emergency Department if any problems should occur.  Please show the CHEMOTHERAPY ALERT CARD or IMMUNOTHERAPY ALERT CARD at check-in  to the Emergency Department and triage nurse.  Should you have questions after your visit or need to cancel or reschedule your appointment, please contact [facility] - A DEPT OF [facility] [phone]  and follow the prompts.  Office hours are 8:00 a.m. to 4:30 p.m. Monday - Friday. Please note that voicemails left after 4:00 p.m. may not be returned until the following business day.  We are closed weekends and major holidays. You have access to a nurse at all times for urgent questions. Please call the main number to the clinic [phone] and follow the prompts.  For any non-urgent questions, you may also contact your provider using MyChart. We now offer e-Visits for anyone 53 and older to request care online for non-urgent symptoms. For details visit [email or web address].   Also download the MyChart app! Go to the app store, search MyChart, open the app, select , and log in with your MyChart username and password.

## 2024-03-03 NOTE — Progress Notes (Signed)
 Traci Parker presents today for injection per the provider's orders.  Retacrit  10,000U administration without incident; injection site WNL; see MAR for injection details.  Patient tolerated procedure well and without incident.  No questions or complaints noted at this time. Patient's hemoglobin noted to be 10.9 today.  Discharged from clinic via wheelchair in stable condition. Alert and oriented x 3. F/U with [facility] as scheduled.

## 2024-03-20 ENCOUNTER — Encounter: Payer: Self-pay | Admitting: Pharmacist

## 2024-03-20 NOTE — Progress Notes (Signed)
   03/20/2024  Patient ID: Traci Parker, female   DOB: Jun 23, 1946, 78 y.o.   MRN: [id number]  Pharmacy Quality Measure Review  This patient is appearing on a report for being at risk of failing the adherence measure for hypertension (ACEi/ARB) medications this calendar year.   Medication: losartan 25 mg  Last fill date: 02/03/24 for 30 day supply  Gets meds filled at [facility] in 7 day cycle packs.  They also do monthly composite billing. Losartan has been filled 10 times this year.  [name], PharmD, BCACP Clinical Pharmacist [phone]

## 2024-04-05 ENCOUNTER — Encounter: Payer: Self-pay | Admitting: Adult Health

## 2024-04-05 ENCOUNTER — Non-Acute Institutional Stay (SKILLED_NURSING_FACILITY): Payer: Self-pay | Admitting: Adult Health

## 2024-04-05 DIAGNOSIS — D696 Thrombocytopenia, unspecified: Secondary | ICD-10-CM | POA: Diagnosis not present

## 2024-04-05 DIAGNOSIS — R627 Adult failure to thrive: Secondary | ICD-10-CM

## 2024-04-05 DIAGNOSIS — G959 Disease of spinal cord, unspecified: Secondary | ICD-10-CM | POA: Diagnosis not present

## 2024-04-05 NOTE — Progress Notes (Unsigned)
 Location:  [facility] Room Number: 146 Place of Service:  SNF (31)   CODE STATUS: dnr   Allergies  Allergen Reactions   Latex Rash   Levofloxacin Nausea And Vomiting   Neomycin-Bacitracin-Polymyxin  [Bacitracin-Neomycin-Polymyxin] Rash   Other Swelling and Rash    TOMATO  SOUP-PER MAR   Prednisone Hives, Itching, Rash and Other (See Comments)   Fish Allergy     rash   Shellfish Allergy Hives   Neosporin [Neomycin-Polymyxin-Gramicidin] Itching and Rash    Chief Complaint  Patient presents with   Medical Management of Chronic Issues        Cervical myelopathy: Thrombocytopenia:  Failure to thrive in adult:    HPI:  She is a 78 y.o. long term resident of this facility being seen for the management of her chronic illnesses:Cervical myelopathy: Thrombocytopenia:  Failure to thrive in adult:. No reports of uncontrolled pain. Her cold intolerance is managed with gloves her tsh is normal along with her vitamin b12 and folic.    Past Medical History:  Diagnosis Date   Allergy    Anemia    pernicious anemia   Arthritis    Asthma    Blood transfusion without reported diagnosis    CKD (chronic kidney disease)    COPD (chronic obstructive pulmonary disease) (HCC) 12/23/2019   Diabetic peripheral neuropathy associated with type 2 diabetes mellitus (HCC) 04/07/2021   DM (diabetes mellitus), type 2 with peripheral vascular complications (HCC) 12/23/2019   Gastritis    GERD (gastroesophageal reflux disease)    Glaucoma    History of blood in urine    Hyperlipidemia    Hypertension    Neuropathy    both feet   Ocular hypertension    Osteoporosis    Stroke (HCC)    Vitamin B12 deficiency anemia due to intrinsic factor deficiency     Past Surgical History:  Procedure Laterality Date   CATARACT EXTRACTION W/PHACO Right 11/27/2022   Procedure: CATARACT EXTRACTION PHACO AND INTRAOCULAR LENS PLACEMENT (IOC);  Surgeon: Harrie Agent, MD;  Location: [facility];  Service: Ophthalmology;  Laterality: Right;  CDE: 15.67   CATARACT EXTRACTION W/PHACO Left 12/18/2022   Procedure: CATARACT EXTRACTION PHACO AND INTRAOCULAR LENS PLACEMENT (IOC);  Surgeon: Harrie Agent, MD;  Location: [facility] OR[facility];  Service: Ophthalmology;  Laterality: Left;  CDE: 11.78   CHOLECYSTECTOMY  2008   COLONOSCOPY  04/30/2011   Procedure: COLONOSCOPY;  Surgeon: Claudis RAYMOND Rivet, MD;  Location: [facility];  Service: Endoscopy;  Laterality: N/A;   COLONOSCOPY  05/25/2012   Procedure: COLONOSCOPY;  Surgeon: Claudis RAYMOND Rivet, MD;  Location: [facility];  Service: Endoscopy;  Laterality: N/A;  1:25-changed to 1200 Ann to notify pt   COLONOSCOPY Bilateral 12/2017   COLONOSCOPY WITH PROPOFOL  N/A 11/29/2020   Procedure: COLONOSCOPY WITH PROPOFOL ;  Surgeon: Eartha Angelia Sieving, MD;  Location: [facility];  Service: Gastroenterology;  Laterality: N/A;  1:35 patient is in the [facility]   ESOPHAGOGASTRODUODENOSCOPY N/A 12/07/2019   Procedure: ESOPHAGOGASTRODUODENOSCOPY (EGD);  Surgeon: Rivet Claudis RAYMOND, MD;  Location: [facility];  Service: Endoscopy;  Laterality: N/A;  155   GALLBLADDER SURGERY     right breast cystectomy  1988   TUBAL LIGATION  1975    Social History   Socioeconomic History   Marital status: Divorced    Spouse name: Not on file   Number of children: 4   Years of education: 9   Highest education level: Not on  file  Occupational History   Occupation: retired   Tobacco Use   Smoking status: Former    Current packs/day: 0.00    Average packs/day: 1 pack/day for 40.0 years (40.0 ttl pk-yrs)    Types: Cigarettes    Start date: 04/03/1971    Quit date: 04/03/2011    Years since quitting: 13.0   Smokeless tobacco: Never  Vaping Use   Vaping status: Never Used  Substance and Sexual Activity   Alcohol use: No   Drug use: No   Sexual activity: Not Currently  Other Topics Concern   Not on file  Social History Narrative   01/[birth year] lives at [facility],  [place]   Social Drivers of Health   Financial Resource Strain: Not on file  Food Insecurity: Not on file  Transportation Needs: Not on file  Physical Activity: Not on file  Stress: Not on file  Social Connections: Not on file  Intimate Partner Violence: Not on file   Family History  Problem Relation Age of Onset   Diabetes Mother    Hyperlipidemia Mother    Hypertension Mother    Heart disease Father    Hyperlipidemia Father    Diabetes Sister    Hyperlipidemia Sister    Hypertension Sister    Cancer Brother    Heart disease Brother    Hyperlipidemia Brother    Hypertension Brother    Diabetes Sister       VITAL SIGNS BP 109/67   Pulse 69   Temp (!) 97 F (36.1 C)   Resp 20   Ht 5' 5 (1.651 m)   Wt 141 lb 12.8 oz (64.3 kg)   SpO2 99%   BMI 23.60 kg/m   Outpatient Encounter Medications as of 04/05/2024  Medication Sig   amLODipine  (NORVASC ) 5 MG tablet Take 5 mg by mouth daily.   aspirin EC 81 MG tablet Take 81 mg by mouth daily. Swallow whole.   Calcium  Carb-Cholecalciferol (CALCIUM  500 + D) 500-5 MG-MCG TABS Take 1 tablet by mouth daily.   cyanocobalamin  1000 MCG tablet Take 1,000 mcg by mouth 2 (two) times a week.   ferrous sulfate 325 (65 FE) MG EC tablet Take 325 mg by mouth daily.   folic acid  (V-R FOLIC ACID ) 400 MCG tablet Take 1 tablet (400 mcg total) by mouth every other day.   gabapentin  (NEURONTIN ) 100 MG capsule Take 100 mg by mouth 2 (two) times daily. (2100)   gabapentin  (NEURONTIN ) 100 MG capsule Take 100 mg by mouth daily.   losartan (COZAAR) 25 MG tablet Take 25 mg by mouth daily.   NON FORMULARY Diet:Regular thin liquids  ALLERGIC TO FISH   omeprazole  (PRILOSEC) 40 MG capsule Take 40 mg by mouth daily at 6 (six) AM. (0600)   polyethylene glycol (MIRALAX  / GLYCOLAX ) 17 g packet Take 17 g by mouth daily.   Polyvinyl Alcohol-Povidone PF 1.4-0.6 % SOLN Apply 1 drop to eye in the morning and at bedtime.   rosuvastatin (CRESTOR) 20 MG  tablet Take 20 mg by mouth daily.   senna-docusate (SENOKOT S) 8.6-50 MG tablet Take 1 tablet by mouth 2 (two) times daily.   sodium chloride  1 g tablet Take 1 g by mouth 2 (two) times daily with a meal.   No facility-administered encounter medications on file as of 04/05/2024.     SIGNIFICANT DIAGNOSTIC EXAMS  LABS REVIEWED PREVIOUS     03-24-23: wbc 6.2; hgb 9.8; hct 31.5; mcv 93.5; plt 235; glucose 131;  bun 18; creat 1.44; k+ 4.7; na++ 134; ca 8.4; gfr 37; protein 6.9; albumin 3.7  vitamin B12: 1068; iron  33; tibc 173 04-23-23: wbc 7.4; hgb 10.4; hct 34.0; mcv 95.8 plt 143 05-20-23: tsh 2.925 05-21-23: wbc 5.7; hgb 10.4; hct 34.0; mcv 95.2 plt 149    06-18-23: wbc 6.8; hgb 11.1; hct 36.1; mcv 95.3 plt 192; glucose 154; bun 18; creat 1.36; k+ 5.1; na++ 134; ca 9.3; gfr 40 protein 7.4; albumin 4.1 07-14-23: wbc 4.8; hgb 9.9; hct 31.0; mcv 92.0 plt 144; glucose 117; bun 18; creat 1.39; k+ 4.6; na++ 134; ca 9.0; gfr 39; d-dimer 1.18; CRP 0.8 08-13-23: wbc 6.7; hgb 10.2; hct 33.6; mcv 93.6 plt 141  09-24-23: tsh 2.020 free t3: 1.8 free t4: 0.98 10-08-23: wbc 5.7; hgb 9.4; hct 31.2; mcv 95.7 plt 178; protein 6.7 albumin 3.5 vitamin D  44.18 10-28-23: wbc 8.3; hgb 10.5; hct 34.7 mcv 94.8 plt 158 01-21-24: wbc 6.9; hgb 10.8; hct 35.3; mcv 91.9 plt 175; glucose 118; bun 16; creat 1.61; k+ 4.5; na++ 139; ca 9.1; gfr 32; protein 7.6 albumin 3.9 iron  27; tibc 183 02-15-24: wbc 5.8; hgb 10.1; hct 33.0; mcv 90.9 plt 187 glucose 88; bun 15; creat 1.47; k+ 4.9; na++ 138; ca 8.9; gfr 36; phos 4.0 albumin 3.5  NO NEW LABS.      Review of Systems  Constitutional:  Negative for malaise/fatigue.  Respiratory:  Negative for cough and shortness of breath.   Cardiovascular:  Negative for chest pain, palpitations and leg swelling.  Gastrointestinal:  Negative for abdominal pain, constipation and heartburn.  Musculoskeletal:  Negative for back pain, joint pain and myalgias.  Skin: Negative.   Neurological:  Negative for  dizziness.  Psychiatric/Behavioral:  The patient is not nervous/anxious.    Physical Exam Constitutional:      General: She is not in acute distress.    Appearance: She is well-developed. She is not diaphoretic.  Neck:     Thyroid : No thyromegaly.  Cardiovascular:     Rate and Rhythm: Normal rate and regular rhythm.     Heart sounds: Normal heart sounds.  Pulmonary:     Effort: Pulmonary effort is normal. No respiratory distress.     Breath sounds: Normal breath sounds.  Abdominal:     General: Bowel sounds are normal. There is no distension.     Palpations: Abdomen is soft.     Tenderness: There is no abdominal tenderness.  Musculoskeletal:        General: Normal range of motion.     Cervical back: Neck supple.     Right lower leg: No edema.     Left lower leg: No edema.  Lymphadenopathy:     Cervical: No cervical adenopathy.  Skin:    General: Skin is warm and dry.  Neurological:     Mental Status: She is alert. Mental status is at baseline.  Psychiatric:        Mood and Affect: Mood normal.      ASSESSMENT/ PLAN:  TODAY   Cervical myelopathy: is without change; wears gloves due to cold hands  2. Thrombocytopenia: plt 187 will monitor   3. Failure to thrive in adult: she is gaining weight to 141 pounds will monitor   PREVIOUS    4. Aortic atherosclerosis (ct 12-23-19) will monitor   5. 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 Chronic obstructive pulmonary disease unspecified COPD type:  will monitor  7. Hypertension associated with stage 3 chronic kidney disease due to type 2 diabetes mellitus: b/p 129/69: will continue cozaar 25 mg daily   8. CKD stage 3 due to type 2 diabetes mellitus: bun 18; creat 1.39 gfr 39   9. Diabetic peripheral neuropathy associated with type 2 diabetes mellitus: is presently managed; will continue gabapentin  100 mg in the AM and 200 mg in the PM  10. Type 2 diabetes  mellitus with stage 3 chronic kidney disease without long term current use of insulin hgb A1c 5.3 is on asa; statin; arb   11. Hyperlipidemia associated with type 2 diabetes mellitus: ldl 63 will continue crestor 20 mg daily   12. Anemia due to chronic renal failure with erythropoietin  stage 3: hgb 10.1; will continue to monitor is followed by oncology  13. Other chronic gastritis without hemorrhage: will continue prilosec 40 mg daily   14. Increased intraocular pressure bilateral: will continue to monitor    15. Vitamin B12 deficiency: level 1068; folate 32.8 is on folic acid  every other day and vitamin B12: 1000 mcg twice weekly   16. Vitamin D  deficiency: level is 44.18 will monitor   17. Thyroiditis: tsh 2.020 will monitor      Barnie Seip NP [facility]   call [phone]

## 2024-04-06 DIAGNOSIS — D696 Thrombocytopenia, unspecified: Secondary | ICD-10-CM | POA: Insufficient documentation

## 2024-04-14 ENCOUNTER — Inpatient Hospital Stay

## 2024-04-14 ENCOUNTER — Inpatient Hospital Stay: Attending: Hematology

## 2024-04-14 VITALS — BP 145/85 | HR 83 | Temp 97.0°F | Resp 18

## 2024-04-14 DIAGNOSIS — N1832 Chronic kidney disease, stage 3b: Secondary | ICD-10-CM | POA: Diagnosis present

## 2024-04-14 DIAGNOSIS — D631 Anemia in chronic kidney disease: Secondary | ICD-10-CM | POA: Insufficient documentation

## 2024-04-14 DIAGNOSIS — E538 Deficiency of other specified B group vitamins: Secondary | ICD-10-CM | POA: Insufficient documentation

## 2024-04-14 LAB — CBC
HCT: 35.4 % — ABNORMAL LOW (ref 36.0–46.0)
Hemoglobin: 10.9 g/dL — ABNORMAL LOW (ref 12.0–15.0)
MCH: 28.3 pg (ref 26.0–34.0)
MCHC: 30.8 g/dL (ref 30.0–36.0)
MCV: 91.9 fL (ref 80.0–100.0)
Platelets: 164 K/uL (ref 150–400)
RBC: 3.85 MIL/uL — ABNORMAL LOW (ref 3.87–5.11)
RDW: 15.8 % — ABNORMAL HIGH (ref 11.5–15.5)
WBC: 8.4 K/uL (ref 4.0–10.5)
nRBC: 0 % (ref 0.0–0.2)

## 2024-04-14 MED ORDER — EPOETIN ALFA-EPBX 10000 UNIT/ML IJ SOLN
10000.0000 [IU] | Freq: Once | INTRAMUSCULAR | Status: AC
  Administered 2024-04-14: 10000 [IU] via SUBCUTANEOUS
  Filled 2024-04-14: qty 1

## 2024-04-14 NOTE — Patient Instructions (Signed)

## 2024-04-14 NOTE — Progress Notes (Signed)
 Patient's Hgb 10.9 and blood pressure stable. Patient  tolerated retacrit  injection with no complaints voiced.  Site clean and dry with no bruising or swelling noted at site.  See MAR for details.  Band aid applied.  Patient stable during and after injection.  Vss with discharge and left in satisfactory condition with no s/s of distress noted. All follow ups as scheduled.   Traci Parker

## 2024-05-03 ENCOUNTER — Encounter: Payer: Self-pay | Admitting: Internal Medicine

## 2024-05-03 ENCOUNTER — Non-Acute Institutional Stay (SKILLED_NURSING_FACILITY): Admitting: Internal Medicine

## 2024-05-03 DIAGNOSIS — D631 Anemia in chronic kidney disease: Secondary | ICD-10-CM | POA: Diagnosis not present

## 2024-05-03 DIAGNOSIS — N183 Chronic kidney disease, stage 3 unspecified: Secondary | ICD-10-CM | POA: Diagnosis not present

## 2024-05-03 DIAGNOSIS — E1122 Type 2 diabetes mellitus with diabetic chronic kidney disease: Secondary | ICD-10-CM

## 2024-05-03 DIAGNOSIS — R635 Abnormal weight gain: Secondary | ICD-10-CM | POA: Diagnosis not present

## 2024-05-03 DIAGNOSIS — F339 Major depressive disorder, recurrent, unspecified: Secondary | ICD-10-CM | POA: Diagnosis not present

## 2024-05-03 NOTE — Assessment & Plan Note (Addendum)
 Clinically she is communicative and interactive and exhibits no signs of active depression.

## 2024-05-03 NOTE — Progress Notes (Addendum)
 NURSING HOME LOCATION:  [facility] ROOM NUMBER:  [id number] P  CODE STATUS:  DNR  PCP: Landy Barnie RAMAN, NP   This is a nursing facility follow up visit  of chronic medical diagnoses to document compliance with Regulation 483.30 (c) in The Long Term Care Survey Manual Phase 2 which mandates caregiver visit ( visits can alternate among physician, PA or NP as per statutes) within 10 days of 30 days / 60 days/ 90 days post admission to SNF date  .  Interim medical record and care since last SNF visit was updated with review of diagnostic studies and change in clinical status since last visit were documented.  HPI: She is a permanent resident of this facility with medical diagnoses of history of asthma; COPD; diabetes with peripheral neuropathy; GERD; glaucoma; dyslipidemia; history of stroke; history of B12 deficiency; and osteoporosis.  Although she has a history of B12 deficiency; her anemia is stable with H/H of 10.9/35.4 with normochromic, normocytic indices.  There has been slight improvement in her CKD with current eGFR of 36 indicating CKD stage IIIb.  Prior eGFR was 32.  Correspondingly creatinine has dropped from 1.61 down to 1.47.  Although she has a history of diabetes; the recorded A1c's have varied from 4.9 up to a high of 5.4%.  Most recent assessment was 12/31/2022.  She has had mild hyperglycemia intermittently with a range of 88-118 over the last 10 months.  Despite a history of cold intolerance; TSHs on record have been therapeutic.  Review of systems: She states that her appetite is improved and she is gaining weight.  She states when she first came to the SNF her weight was 99; it is presently 141.  She made the comment I am looking like a little piggy.  She continues to have cold intolerance despite the weight gain.  She describes arthritis in her hands.  She denies any depression; she has adjusted to the SNF and is back in therapy.  She leaves the facility with her  daughter frequently.  Constitutional: No fever, fatigue  Eyes: No redness, discharge, pain, vision change ENT/mouth: No nasal congestion,  purulent discharge, earache, change in hearing, sore throat  Cardiovascular: No chest pain, palpitations, paroxysmal nocturnal dyspnea, claudication, edema  Respiratory: No cough, sputum production, hemoptysis, DOE, significant snoring, apnea   Gastrointestinal: No heartburn, dysphagia, abdominal pain, nausea /vomiting, rectal bleeding, melena, change in bowels Genitourinary: No dysuria, hematuria, pyuria, incontinence, nocturia Dermatologic: No rash, pruritus, change in appearance of skin Neurologic: No dizziness, headache, syncope, seizures, numbness, tingling Psychiatric: No significant anxiety,  insomnia, anorexia Endocrine: No change in hair/skin/nails, excessive thirst, excessive hunger, excessive urination  Hematologic/lymphatic: No significant bruising, lymphadenopathy, abnormal bleeding Allergy/immunology: No itchy/watery eyes, significant sneezing, urticaria, angioedema  Physical exam:  Pertinent or positive findings: She was sitting in the wheelchair & as is always the case, she was wearing woolen mittens & bedroom shoes , jacket and had a blanket over her lap.  She is gregarious and interactive.  She is edentulous.  Heart sounds are distant.  There is slight increase in second heart sound.  Pedal pulses are decreased.  Despite the weight gain she has limb atrophy.  There is flexion of the right DIP joint.  The other fingers exhibit hyperextension at the PIP joints.  She has faint irregular hyperpigmentation of the nose and bilateral cheeks.  General appearance: no acute distress, increased work of breathing is present.   Lymphatic: No lymphadenopathy about the head,  neck, axilla. Eyes: No conjunctival inflammation or lid edema is present. There is no scleral icterus. Ears:  External ear exam shows no significant lesions or deformities.   Nose:   External nasal examination shows no deformity or inflammation. Nasal mucosa are pink and moist without lesions, exudates Oral exam:  Lips and gums are healthy appearing. There is no oropharyngeal erythema or exudate. Neck:  No thyromegaly, masses, tenderness noted.    Heart:  Normal rate and regular rhythm without gallop, murmur, click, rub .  Lungs: Chest clear to auscultation without wheezes, rhonchi, rales, rubs. Abdomen: Bowel sounds are normal. Abdomen is soft and nontender with no organomegaly, hernias, masses. GU: Deferred  Extremities:  No cyanosis, clubbing, edema  Neurologic exam :Balance, Rhomberg, finger to nose testing could not be completed due to clinical state Skin: Warm & dry w/o tenting. No significant rash.  See summary under each active problem in the Problem List with associated updated therapeutic plan :  Weight gain She states that she originally weighed 99 pounds at the time of admission and now weighs 141.  The weight gain is in the context of improved appetite and is physiologic.  Serially TSHs have been therapeutic.  Anemia due to chronic renal failure treated with erythropoietin , stage 3 (moderate) (HCC) Serially the anemia is stable with normochromic, normocytic indices despite a history of B12 deficiency.  No bleeding dyscrasias reported; continue to monitor.  CKD stage 3 due to type 2 diabetes mellitus (HCC) CKD is stage III b ; eGFR has improved to 36 from a prior value of 32.  Creatinine has dropped from 1.61-1.47.  Despite the diagnosis of diabetes; there are no recorded A1c's higher than 5.4%.  Depression, recurrent Clinically she is communicative and interactive and exhibits no signs of active depression.

## 2024-05-03 NOTE — Assessment & Plan Note (Addendum)
 She states that she originally weighed 99 pounds at the time of admission and now weighs 141.  The weight gain is in the context of improved appetite and is physiologic.  Serially TSHs have been therapeutic.

## 2024-05-03 NOTE — Patient Instructions (Signed)
 See assessment and plan under each diagnosis in the problem list and acutely for this visit

## 2024-05-03 NOTE — Assessment & Plan Note (Signed)
 Serially the anemia is stable with normochromic, normocytic indices despite a history of B12 deficiency.  No bleeding dyscrasias reported; continue to monitor.

## 2024-05-03 NOTE — Assessment & Plan Note (Addendum)
 CKD is stage III b ; eGFR has improved to 36 from a prior value of 32.  Creatinine has dropped from 1.61-1.47.  Despite the diagnosis of diabetes; there are no recorded A1c's higher than 5.4%.

## 2024-05-05 ENCOUNTER — Non-Acute Institutional Stay (SKILLED_NURSING_FACILITY): Payer: Self-pay | Admitting: Adult Health

## 2024-05-05 ENCOUNTER — Encounter: Payer: Self-pay | Admitting: Adult Health

## 2024-05-05 DIAGNOSIS — G959 Disease of spinal cord, unspecified: Secondary | ICD-10-CM | POA: Diagnosis not present

## 2024-05-05 DIAGNOSIS — N183 Chronic kidney disease, stage 3 unspecified: Secondary | ICD-10-CM

## 2024-05-05 DIAGNOSIS — I700 Atherosclerosis of aorta: Secondary | ICD-10-CM | POA: Diagnosis not present

## 2024-05-05 DIAGNOSIS — I129 Hypertensive chronic kidney disease with stage 1 through stage 4 chronic kidney disease, or unspecified chronic kidney disease: Secondary | ICD-10-CM | POA: Diagnosis not present

## 2024-05-05 DIAGNOSIS — E1122 Type 2 diabetes mellitus with diabetic chronic kidney disease: Secondary | ICD-10-CM

## 2024-05-05 NOTE — Progress Notes (Signed)
 Location:  [facility] Room Number: 151 Place of Service:  SNF (31)   CODE STATUS: dnr   Allergies  Allergen Reactions   Latex Rash   Levofloxacin Nausea And Vomiting   Neomycin-Bacitracin-Polymyxin  [Bacitracin-Neomycin-Polymyxin] Rash   Other Swelling and Rash    TOMATO  SOUP-PER MAR   Prednisone Hives, Itching, Rash and Other (See Comments)   Fish Allergy     rash   Shellfish Allergy Hives   Neosporin [Neomycin-Polymyxin-Gramicidin] Itching and Rash    Chief Complaint  Patient presents with   Acute Visit    Care plan meeting     HPI:  We have come together for her care plan meeting. BIMS 15/15 mood 0/30. She is using wheelchair without falls. She requires dependent assist with her adl care. She is frequently incontinent of bladder and incontinent of bowel. Dietary: regular diet setup for meals; weight 140.8 pounds; appetite 51-100%. Therapy: propel self in wheelchair, ambulate 50 feet with rolling walker min assist. Mod assist for transfers. Activities: is active. She will continue to be followed for her chronic illnesses including:   Hypertension associated with stage 3 chronic kidney disease due to type 2 diabetes mellitus     Aortic atherosclerosis     Cervical myelopathy  Past Medical History:  Diagnosis Date   Allergy    Anemia    pernicious anemia   Arthritis    Asthma    Blood transfusion without reported diagnosis    CKD (chronic kidney disease)    COPD (chronic obstructive pulmonary disease) (HCC) 12/23/2019   Diabetic peripheral neuropathy associated with type 2 diabetes mellitus (HCC) 04/07/2021   DM (diabetes mellitus), type 2 with peripheral vascular complications (HCC) 12/23/2019   Gastritis    GERD (gastroesophageal reflux disease)    Glaucoma    History of blood in urine    Hyperlipidemia    Hypertension    Neuropathy    both feet   Ocular hypertension    Osteoporosis    Stroke (HCC)    Vitamin B12 deficiency anemia due  to intrinsic factor deficiency     Past Surgical History:  Procedure Laterality Date   CATARACT EXTRACTION W/PHACO Right 11/27/2022   Procedure: CATARACT EXTRACTION PHACO AND INTRAOCULAR LENS PLACEMENT (IOC);  Surgeon: Harrie Agent, MD;  Location: [facility];  Service: Ophthalmology;  Laterality: Right;  CDE: 15.67   CATARACT EXTRACTION W/PHACO Left 12/18/2022   Procedure: CATARACT EXTRACTION PHACO AND INTRAOCULAR LENS PLACEMENT (IOC);  Surgeon: Harrie Agent, MD;  Location: [facility];  Service: Ophthalmology;  Laterality: Left;  CDE: 11.78   CHOLECYSTECTOMY  2008   COLONOSCOPY  04/30/2011   Procedure: COLONOSCOPY;  Surgeon: Claudis RAYMOND Rivet, MD;  [facility]: [facility];  Service: Endoscopy;  Laterality: N/A;   COLONOSCOPY  05/25/2012   Procedure: COLONOSCOPY;  Surgeon: Claudis RAYMOND Rivet, MD;  [facility]: [facility];  Service: Endoscopy;  Laterality: N/A;  1:25-changed to 1200 Ann to notify pt   COLONOSCOPY Bilateral 12/2017   COLONOSCOPY WITH PROPOFOL  N/A 11/29/2020   Procedure: COLONOSCOPY WITH PROPOFOL ;  Surgeon: Eartha Angelia Sieving, MD;  [facility]: [facility];  Service: Gastroenterology;  Laterality: N/A;  1:35 [facility] is in the [facility]   ESOPHAGOGASTRODUODENOSCOPY N/A 12/07/2019   Procedure: ESOPHAGOGASTRODUODENOSCOPY (EGD);  Surgeon: Rivet Claudis RAYMOND, MD;  [facility]: [facility];  Service: Endoscopy;  Laterality: N/A;  155   GALLBLADDER SURGERY     right breast cystectomy  1988   TUBAL LIGATION  1975  Social History   Socioeconomic History   Marital status: Divorced    Spouse name: Not on file   Number of children: 4   Years of education: 9   Highest education level: Not on file  Occupational History   Occupation: retired   Tobacco Use   Smoking status: Former    Current packs/day: 0.00    Average packs/day: 1 pack/day for 40.0 years (40.0 ttl pk-yrs)    Types: Cigarettes    Start date: 04/03/1971    Quit date: 04/03/2011    Years since quitting: 13.0    Smokeless tobacco: Never  Vaping Use   Vaping status: Never Used  Substance and Sexual Activity   Alcohol use: No   Drug use: No   Sexual activity: Not Currently  Other Topics Concern   Not on file  Social History Narrative   01/17/[birth year] lives at [facility], [place]   Social Drivers of Health   Financial Resource Strain: Not on file  Food Insecurity: Not on file  Transportation Needs: Not on file  Physical Activity: Not on file  Stress: Not on file  Social Connections: Not on file  Intimate Partner Violence: Not on file   Family History  Problem Relation Age of Onset   Diabetes Mother    Hyperlipidemia Mother    Hypertension Mother    Heart disease Father    Hyperlipidemia Father    Diabetes Sister    Hyperlipidemia Sister    Hypertension Sister    Cancer Brother    Heart disease Brother    Hyperlipidemia Brother    Hypertension Brother    Diabetes Sister       VITAL SIGNS BP 129/77   Pulse 70   Temp 98.3 F (36.8 C)   Resp 18   Ht 5' 5 (1.651 m)   Wt 140 lb 12.8 oz (63.9 kg)   SpO2 97%   BMI 23.43 kg/m   Outpatient Encounter Medications as of 05/05/2024  Medication Sig   amLODipine  (NORVASC ) 5 MG tablet Take 5 mg by mouth daily.   aspirin EC 81 MG tablet Take 81 mg by mouth daily. Swallow whole.   Calcium  Carb-Cholecalciferol (CALCIUM  500 + D) 500-5 MG-MCG TABS Take 1 tablet by mouth daily.   cyanocobalamin  1000 MCG tablet Take 1,000 mcg by mouth 2 (two) times a week.   ferrous sulfate 325 (65 FE) MG EC tablet Take 325 mg by mouth daily.   folic acid  (V-R FOLIC ACID ) 400 MCG tablet Take 1 tablet (400 mcg total) by mouth every other day.   gabapentin  (NEURONTIN ) 100 MG capsule Take 100 mg by mouth 2 (two) times daily. (2100)   gabapentin  (NEURONTIN ) 100 MG capsule Take 100 mg by mouth daily.   losartan (COZAAR) 25 MG tablet Take 25 mg by mouth daily.   NON FORMULARY Diet:Regular thin liquids  ALLERGIC TO FISH   omeprazole  (PRILOSEC) 40 MG capsule Take  40 mg by mouth daily at 6 (six) AM. (0600)   polyethylene glycol (MIRALAX  / GLYCOLAX ) 17 g packet Take 17 g by mouth daily.   Polyvinyl Alcohol-Povidone PF 1.4-0.6 % SOLN Apply 1 drop to eye in the morning and at bedtime.   rosuvastatin (CRESTOR) 20 MG tablet Take 20 mg by mouth daily.   senna-docusate (SENOKOT S) 8.6-50 MG tablet Take 1 tablet by mouth 2 (two) times daily.   sodium chloride  1 g tablet Take 1 g by mouth 2 (two) times daily with a meal.  No facility-administered encounter medications on file as of 05/05/2024.     SIGNIFICANT DIAGNOSTIC EXAMS  LABS REVIEWED PREVIOUS     04-23-23: wbc 7.4; hgb 10.4; hct 34.0; mcv 95.8 plt 143 05-20-23: tsh 2.925 05-21-23: wbc 5.7; hgb 10.4; hct 34.0; mcv 95.2 plt 149    06-18-23: wbc 6.8; hgb 11.1; hct 36.1; mcv 95.3 plt 192; glucose 154; bun 18; creat 1.36; k+ 5.1; na++ 134; ca 9.3; gfr 40 protein 7.4; albumin 4.1 07-14-23: wbc 4.8; hgb 9.9; hct 31.0; mcv 92.0 plt 144; glucose 117; bun 18; creat 1.39; k+ 4.6; na++ 134; ca 9.0; gfr 39; d-dimer 1.18; CRP 0.8 08-13-23: wbc 6.7; hgb 10.2; hct 33.6; mcv 93.6 plt 141  09-24-23: tsh 2.020 free t3: 1.8 free t4: 0.98 10-08-23: wbc 5.7; hgb 9.4; hct 31.2; mcv 95.7 plt 178; protein 6.7 albumin 3.5 vitamin D  44.18 10-28-23: wbc 8.3; hgb 10.5; hct 34.7 mcv 94.8 plt 158 01-21-24: wbc 6.9; hgb 10.8; hct 35.3; mcv 91.9 plt 175; glucose 118; bun 16; creat 1.61; k+ 4.5; na++ 139; ca 9.1; gfr 32; protein 7.6 albumin 3.9 iron  27; tibc 183 02-15-24: wbc 5.8; hgb 10.1; hct 33.0; mcv 90.9 plt 187 glucose 88; bun 15; creat 1.47; k+ 4.9; na++ 138; ca 8.9; gfr 36; phos 4.0 albumin 3.5  NO NEW LABS.      Review of Systems  Constitutional:  Negative for malaise/fatigue.  Respiratory:  Negative for cough and shortness of breath.   Cardiovascular:  Negative for chest pain, palpitations and leg swelling.  Gastrointestinal:  Negative for abdominal pain, constipation and heartburn.  Musculoskeletal:  Negative for back pain,  joint pain and myalgias.  Skin: Negative.   Neurological:  Negative for dizziness.  Psychiatric/Behavioral:  The patient is not nervous/anxious.     Physical Exam Constitutional:      General: She is not in acute distress.    Appearance: She is well-developed. She is not diaphoretic.  Neck:     Thyroid : No thyromegaly.  Cardiovascular:     Rate and Rhythm: Normal rate and regular rhythm.     Heart sounds: Normal heart sounds.  Pulmonary:     Effort: Pulmonary effort is normal. No respiratory distress.     Breath sounds: Normal breath sounds.  Abdominal:     General: Bowel sounds are normal. There is no distension.     Palpations: Abdomen is soft.     Tenderness: There is no abdominal tenderness.  Musculoskeletal:        General: Normal range of motion.     Cervical back: Neck supple.     Right lower leg: No edema.     Left lower leg: No edema.  Lymphadenopathy:     Cervical: No cervical adenopathy.  Skin:    General: Skin is warm and dry.  Neurological:     Mental Status: She is alert. Mental status is at baseline.  Psychiatric:        Mood and Affect: Mood normal.     ASSESSMENT/ PLAN:  TODAY  Hypertension associated with stage 3 chronic kidney disease due to type 2 diabetes mellitus Aortic atherosclerosis Cervical myelopathy  Will continue current medications Will continue current plan of care Will continue to monitor her status.   Time spent with patient: 40 minutes: medications; dietary; plan of care.    Barnie Seip NP [facility]   call [phone]

## 2024-05-26 ENCOUNTER — Inpatient Hospital Stay

## 2024-05-26 ENCOUNTER — Inpatient Hospital Stay: Attending: Hematology | Admitting: Oncology

## 2024-05-26 DIAGNOSIS — N1832 Chronic kidney disease, stage 3b: Secondary | ICD-10-CM | POA: Diagnosis present

## 2024-05-26 DIAGNOSIS — E538 Deficiency of other specified B group vitamins: Secondary | ICD-10-CM | POA: Diagnosis present

## 2024-05-26 DIAGNOSIS — Z86718 Personal history of other venous thrombosis and embolism: Secondary | ICD-10-CM | POA: Insufficient documentation

## 2024-05-26 DIAGNOSIS — Z7982 Long term (current) use of aspirin: Secondary | ICD-10-CM | POA: Diagnosis not present

## 2024-05-26 DIAGNOSIS — Z8673 Personal history of transient ischemic attack (TIA), and cerebral infarction without residual deficits: Secondary | ICD-10-CM | POA: Diagnosis not present

## 2024-05-26 DIAGNOSIS — N183 Chronic kidney disease, stage 3 unspecified: Secondary | ICD-10-CM

## 2024-05-26 DIAGNOSIS — D631 Anemia in chronic kidney disease: Secondary | ICD-10-CM | POA: Insufficient documentation

## 2024-05-26 DIAGNOSIS — Z87891 Personal history of nicotine dependence: Secondary | ICD-10-CM | POA: Insufficient documentation

## 2024-05-26 DIAGNOSIS — M810 Age-related osteoporosis without current pathological fracture: Secondary | ICD-10-CM | POA: Insufficient documentation

## 2024-05-26 DIAGNOSIS — Z7901 Long term (current) use of anticoagulants: Secondary | ICD-10-CM | POA: Insufficient documentation

## 2024-05-26 DIAGNOSIS — Z79899 Other long term (current) drug therapy: Secondary | ICD-10-CM | POA: Diagnosis not present

## 2024-05-26 LAB — CBC
HCT: 36.2 % (ref 36.0–46.0)
Hemoglobin: 11 g/dL — ABNORMAL LOW (ref 12.0–15.0)
MCH: 27.7 pg (ref 26.0–34.0)
MCHC: 30.4 g/dL (ref 30.0–36.0)
MCV: 91.2 fL (ref 80.0–100.0)
Platelets: 181 K/uL (ref 150–400)
RBC: 3.97 MIL/uL (ref 3.87–5.11)
RDW: 14.6 % (ref 11.5–15.5)
WBC: 7.7 K/uL (ref 4.0–10.5)
nRBC: 0 % (ref 0.0–0.2)

## 2024-05-26 NOTE — Progress Notes (Signed)
 HGB 11.0. NO Retacrit  needed today.

## 2024-05-26 NOTE — Progress Notes (Signed)
 [facility] [address], [place] [address]   CLINIC:  Medical Oncology/Hematology  PCP:  Landy Barnie RAMAN, NP  [address] [place] [address] [phone]   REASON FOR VISIT:  Follow-up for anemia due to CKD stage IIIb   CURRENT THERAPY: IV iron  & Retacrit    INTERVAL HISTORY:   Traci Parker 78 y.o. female returns for routine follow-up of anemia.    In the interim, she denies any hospitalizations, surgeries or changes to her baseline health. She has been receiving Retacrit  10,000 units every 6 weeks past few months.  Last Retacrit  was on 04/14/2024.  Hemoglobin has been maintaining between 10 and 11 since March 2025.  Reports she continues to tolerate Retacrit  well.  Denies any changes to her health.  Appetite and energy levels are 75%.  She continues to have chronic numbness and burning in her feet.  She lives at the [facility].    She has not noticed any blood loss such as hematemesis, hematochezia, melena, or epistaxis.   No pica, restless legs, or headaches. No chest pain, dyspnea on exertion, lightheadedness, or syncope.  She continues to take folic acid  and Vitamin B12 500 mcg every other day. Using otc eye drops for glaucoma.   She is currently not taking iron  supplements although she has been on vitamins in the past.  Appetite is at 100% energy levels are 75%.  Denies any change in her weight.  ASSESSMENT & PLAN:  1.  Normocytic anemia, secondary to CKD and relative iron  deficiency - Etiology is from combination of CKD and relative iron  deficiency. - Initial work-up included SPEP which was negative.  Stool was negative for occult blood.  Hemoglobin electrophoresis showed severe microcytosis.  She could have underlying thalassemia/sickle cell trait. - Last colonoscopy was in June 2019 in [place], 3 polyps removed. - She has a history of needing a blood transfusion in the early 2000's. -Iron  supplement discontinued in June 2024 due to elevated  ferritin. - She receives Retacrit  10,000 units every 6 weeks.  - She denies any bright red blood per rectum or melena        2.  CKD stage IIIa/b - Ultrasound the kidneys on 08/22/2019 showed bilateral renal atrophy, severe atrophy of the right kidney.   3.  B12 and folic acid  deficiency: - She previously received B12 injections monthly at SNF - She is currently taking folic acid  every other day + vitamin B12 500 mcg every other day.   -Will recheck levels at her next visit.   4.  History of lower extremity DVT - Venous duplex (02/02/2020): Extensive acute appearing left femoropopliteal occlusive DVT extending into the calf veins. - Considered provoked in the setting of immobility/bedbound functional status - Venous ultrasound (03/24/2022) shows a previous left-sided femoral, popliteal, and DVT from 2021 has completely resolved - Eliquis  was discontinued by her PCP, placed on aspirin and statin - No current signs or symptoms of recurrent DVT     PLAN: 1. Anemia due to stage 3b chronic kidney disease (HCC) (Primary) -Lab work from today shows a hemoglobin of 11. -Most recent iron  levels are from 01/21/2024 which show a ferritin of 318, iron  saturation 15% and normal TIBC. -She continues iron  tablets every other day.  -Recommend redrawing iron  levels in 8 weeks when she returns for Retacrit . -Given hemoglobin is greater than 11, she does not need Retacrit  today. -Continue every 8 weeks with labs plus or minus Retacrit  and see her  back in 6 months for follow-up.  2. CKD stage III - Most recent CMP shows baseline CKD stage IIIb - Continue follow-up with Dr. Rachele  3.  B12 and folic acid  deficiency: -Labs (09/16/23) showed vitamin B12 at goal 512 (1068), normal MMA, normal folate. -Continue vitamin B12 500 mcg EOD and folic acid  supplements EOD --No recent lab draw.    PLAN SUMMARY: >> CBC + Retacrit  every 8 weeks >> No Retacrit  today. >> Restarting iron  supplements every other day.   Continue B12 and folic acid  supplements. >> Labs/office visit in 18 weeks = CBC/D, CMP, ferritin, iron /TIBC, folate , B12 and MMA      REVIEW OF SYSTEMS:  Review of Systems  Neurological:  Positive for numbness.     PHYSICAL EXAM:  ECOG PERFORMANCE STATUS: 3 - Symptomatic, >50% confined to bed There were no vitals filed for this visit.   There were no vitals filed for this visit. Physical Exam Constitutional:      Appearance: Normal appearance.  Cardiovascular:     Rate and Rhythm: Normal rate and regular rhythm.  Pulmonary:     Effort: Pulmonary effort is normal.     Breath sounds: Normal breath sounds.  Abdominal:     General: Bowel sounds are normal.     Palpations: Abdomen is soft.  Musculoskeletal:        General: No swelling. Normal range of motion.  Neurological:     Mental Status: She is alert and oriented to person, place, and time. Mental status is at baseline.    PAST MEDICAL/SURGICAL HISTORY:  Past Medical History:  Diagnosis Date   Allergy    Anemia    pernicious anemia   Arthritis    Asthma    Blood transfusion without reported diagnosis    CKD (chronic kidney disease)    COPD (chronic obstructive pulmonary disease) (HCC) 12/23/2019   Diabetic peripheral neuropathy associated with type 2 diabetes mellitus (HCC) 04/07/2021   DM (diabetes mellitus), type 2 with peripheral vascular complications (HCC) 12/23/2019   Gastritis    GERD (gastroesophageal reflux disease)    Glaucoma    History of blood in urine    Hyperlipidemia    Hypertension    Neuropathy    both feet   Ocular hypertension    Osteoporosis    Stroke (HCC)    Vitamin B12 deficiency anemia due to intrinsic factor deficiency    Past Surgical History:  Procedure Laterality Date   CATARACT EXTRACTION W/PHACO Right 11/27/2022   Procedure: CATARACT EXTRACTION PHACO AND INTRAOCULAR LENS PLACEMENT (IOC);  Surgeon: Harrie Agent, MD;  Location: [facility];  Service: Ophthalmology;  Laterality:  Right;  CDE: 15.67   CATARACT EXTRACTION W/PHACO Left 12/18/2022   Procedure: CATARACT EXTRACTION PHACO AND INTRAOCULAR LENS PLACEMENT (IOC);  Surgeon: Harrie Agent, MD;  Location: [facility];  Service: Ophthalmology;  Laterality: Left;  CDE: 11.78   CHOLECYSTECTOMY  2008   COLONOSCOPY  04/30/2011   Procedure: COLONOSCOPY;  Surgeon: Claudis RAYMOND Rivet, MD;  [facility];  Service: Endoscopy;  Laterality: N/A;   COLONOSCOPY  05/25/2012   Procedure: COLONOSCOPY;  Surgeon: Claudis RAYMOND Rivet, MD;  [facility];  Service: Endoscopy;  Laterality: N/A;  1:25-changed to 1200 Ann to notify pt   COLONOSCOPY Bilateral 12/2017   COLONOSCOPY WITH PROPOFOL  N/A 11/29/2020   Procedure: COLONOSCOPY WITH PROPOFOL ;  Surgeon: Eartha Angelia Sieving, MD;  [facility];  Service: Gastroenterology;  Laterality: N/A;  1:35 [facility] is in  the [facility]   ESOPHAGOGASTRODUODENOSCOPY N/A 12/07/2019   Procedure: ESOPHAGOGASTRODUODENOSCOPY (EGD);  Surgeon: Golda Claudis PENNER, MD;  [facility];  Service: Endoscopy;  Laterality: N/A;  155   GALLBLADDER SURGERY     right breast cystectomy  1988   TUBAL LIGATION  1975    SOCIAL HISTORY:  Social History   Socioeconomic History   Marital status: Divorced    Spouse name: Not on file   Number of children: 4   Years of education: 9   Highest education level: Not on file  Occupational History   Occupation: retired   Tobacco Use   Smoking status: Former    Current packs/day: 0.00    Average packs/day: 1 pack/day for 40.0 years (40.0 ttl pk-yrs)    Types: Cigarettes    Start date: 04/03/1971    Quit date: 04/03/2011    Years since quitting: 13.1   Smokeless tobacco: Never  Vaping Use   Vaping status: Never Used  Substance and Sexual Activity   Alcohol use: No   Drug use: No   Sexual activity: Not Currently  Other Topics Concern   Not on file  Social History Narrative   01/17[birth year]20 lives at [facility], [place]   Social  Drivers of Health   Financial Resource Strain: Not on file  Food Insecurity: Not on file  Transportation Needs: Not on file  Physical Activity: Not on file  Stress: Not on file  Social Connections: Not on file  Intimate Partner Violence: Not on file    FAMILY HISTORY:  Family History  Problem Relation Age of Onset   Diabetes Mother    Hyperlipidemia Mother    Hypertension Mother    Heart disease Father    Hyperlipidemia Father    Diabetes Sister    Hyperlipidemia Sister    Hypertension Sister    Cancer Brother    Heart disease Brother    Hyperlipidemia Brother    Hypertension Brother    Diabetes Sister     CURRENT MEDICATIONS:  Outpatient Encounter Medications as of 05/26/2024  Medication Sig   amLODipine  (NORVASC ) 5 MG tablet Take 5 mg by mouth daily.   aspirin EC 81 MG tablet Take 81 mg by mouth daily. Swallow whole.   Calcium  Carb-Cholecalciferol (CALCIUM  500 + D) 500-5 MG-MCG TABS Take 1 tablet by mouth daily.   cyanocobalamin  1000 MCG tablet Take 1,000 mcg by mouth 2 (two) times a week.   ferrous sulfate 325 (65 FE) MG EC tablet Take 325 mg by mouth daily.   folic acid  (V-R FOLIC ACID ) 400 MCG tablet Take 1 tablet (400 mcg total) by mouth every other day.   gabapentin  (NEURONTIN ) 100 MG capsule Take 100 mg by mouth 2 (two) times daily. (2100)   gabapentin  (NEURONTIN ) 100 MG capsule Take 100 mg by mouth daily.   losartan (COZAAR) 25 MG tablet Take 25 mg by mouth daily.   NON FORMULARY Diet:Regular thin liquids  ALLERGIC TO FISH   omeprazole  (PRILOSEC) 40 MG capsule Take 40 mg by mouth daily at 6 (six) AM. (0600)   polyethylene glycol (MIRALAX  / GLYCOLAX ) 17 g packet Take 17 g by mouth daily.   Polyvinyl Alcohol-Povidone PF 1.4-0.6 % SOLN Apply 1 drop to eye in the morning and at bedtime.   rosuvastatin (CRESTOR) 20 MG tablet Take 20 mg by mouth daily.   senna-docusate (SENOKOT S) 8.6-50 MG tablet Take 1 tablet by mouth 2 (two) times daily.   sodium chloride  1 g  tablet Take 1 g by mouth 2 (two) times daily with a meal.   No facility-administered encounter medications on file as of 05/26/2024.    ALLERGIES:  Allergies  Allergen Reactions   Latex Rash   Levofloxacin Nausea And Vomiting   Neomycin-Bacitracin-Polymyxin  [Bacitracin-Neomycin-Polymyxin] Rash   Other Swelling and Rash    TOMATO  SOUP-PER MAR   Prednisone Hives, Itching, Rash and Other (See Comments)   Fish Allergy     rash   Shellfish Allergy Hives   Neosporin [Neomycin-Polymyxin-Gramicidin] Itching and Rash    LABORATORY DATA:  I have reviewed the labs as listed.  CBC    Component Value Date/Time   WBC 7.7 05/26/2024 1021   RBC 3.97 05/26/2024 1021   HGB 11.0 (L) 05/26/2024 1021   HCT 36.2 05/26/2024 1021   PLT 181 05/26/2024 1021   MCV 91.2 05/26/2024 1021   MCH 27.7 05/26/2024 1021   MCHC 30.4 05/26/2024 1021   RDW 14.6 05/26/2024 1021   LYMPHSABS 1.8 01/21/2024 1035   MONOABS 0.5 01/21/2024 1035   EOSABS 0.4 01/21/2024 1035   BASOSABS 0.0 01/21/2024 1035      Latest Ref Rng & Units 02/15/2024    7:30 AM 01/21/2024   10:35 AM 01/17/2024    8:00 AM  CMP  Glucose 70 - 99 mg/dL 88  881  95   BUN 8 - 23 mg/dL 15  16  18    Creatinine 0.44 - 1.00 mg/dL 8.52  8.38  8.48   Sodium 135 - 145 mmol/L 138  139  139   Potassium 3.5 - 5.1 mmol/L 4.9  4.5  4.5   Chloride 98 - 111 mmol/L 102  103  106   CO2 22 - 32 mmol/L 25  24  25    Calcium  8.9 - 10.3 mg/dL 8.9  9.1  9.1   Total Protein 6.5 - 8.1 g/dL  7.6    Total Bilirubin 0.0 - 1.2 mg/dL  0.4    Alkaline Phos 38 - 126 U/L  103    AST 15 - 41 U/L  13    ALT 0 - 44 U/L  7      DIAGNOSTIC IMAGING:  I have independently reviewed the relevant imaging and discussed with the patient.   WRAP UP:  All questions were answered. The patient knows to call the clinic with any problems, questions or concerns.  Medical decision making: Moderate  Time spent on visit: I spent 20 minutes dedicated to the care of this patient  (face-to-face and non-face-to-face) on the date of the encounter to include what is described in the assessment and plan.  Traci FORBES Hope, NP  05/26/24 10:58 AM

## 2024-05-30 NOTE — Progress Notes (Signed)
 Pharmacy Quality Measure Review  This patient is appearing on a report for being at risk of failing the adherence measure for cholesterol (statin) and hypertension (ACEi/ARB) medications this calendar year.   Medication:  Rosuvastatin 20 mg Last fill date: 04/05/24 for 30 day supply Losartan 25 mg Last fill date: 04/05/24 for 30 day supply  The patient receives medications from [facility], which dispenses in 7-day cycle packs and then monthly composite billing. The patient has been having their medications filled monthly.   Woodie Jock, PharmD PGY1 Pharmacy Resident  05/30/2024

## 2024-06-06 ENCOUNTER — Encounter: Payer: Self-pay | Admitting: Adult Health

## 2024-06-06 ENCOUNTER — Non-Acute Institutional Stay (SKILLED_NURSING_FACILITY): Payer: Self-pay | Admitting: Adult Health

## 2024-06-06 DIAGNOSIS — I700 Atherosclerosis of aorta: Secondary | ICD-10-CM | POA: Diagnosis not present

## 2024-06-06 DIAGNOSIS — J449 Chronic obstructive pulmonary disease, unspecified: Secondary | ICD-10-CM

## 2024-06-06 DIAGNOSIS — Z86718 Personal history of other venous thrombosis and embolism: Secondary | ICD-10-CM

## 2024-06-06 NOTE — Progress Notes (Signed)
 Location:  [facility] Room Number: 150 Place of Service:  SNF (31)   CODE STATUS: dnr   Allergies  Allergen Reactions   Latex Rash   Levofloxacin Nausea And Vomiting   Neomycin-Bacitracin-Polymyxin  [Bacitracin-Neomycin-Polymyxin] Rash   Other Swelling and Rash    TOMATO  SOUP-PER MAR   Prednisone Hives, Itching, Rash and Other (See Comments)   Fish Allergy     rash   Shellfish Allergy Hives   Neosporin [Neomycin-Polymyxin-Gramicidin] Itching and Rash    Chief Complaint  Patient presents with   Medical Management of Chronic Issues           Aortic atherosclerosis: (ct 12-23-19):   History of left lower extremity dvt    Chronic obstructive pulmonary disease unspecified COPD type    HPI:  She is a 78 y.o. long term resident of this facility being seen for the management of her chronic illnesses:Aortic atherosclerosis: (ct 12-23-19):   History of left lower extremity dvt    Chronic obstructive pulmonary disease unspecified COPD type. Her dvt was induced by megace  and is on chronic asa therapy. There are no reports of shortness of breath present. There are no reports of uncontrolled pain.    Past Medical History:  Diagnosis Date   Allergy    Anemia    pernicious anemia   Arthritis    Asthma    Blood transfusion without reported diagnosis    CKD (chronic kidney disease)    COPD (chronic obstructive pulmonary disease) (HCC) 12/23/2019   Diabetic peripheral neuropathy associated with type 2 diabetes mellitus (HCC) 04/07/2021   DM (diabetes mellitus), type 2 with peripheral vascular complications (HCC) 12/23/2019   Gastritis    GERD (gastroesophageal reflux disease)    Glaucoma    History of blood in urine    Hyperlipidemia    Hypertension    Neuropathy    both feet   Ocular hypertension    Osteoporosis    Stroke (HCC)    Vitamin B12 deficiency anemia due to intrinsic factor deficiency     Past Surgical History:  Procedure Laterality Date    CATARACT EXTRACTION W/PHACO Right 11/27/2022   Procedure: CATARACT EXTRACTION PHACO AND INTRAOCULAR LENS PLACEMENT (IOC);  Surgeon: Harrie Agent, MD;  Location: [facility];  Service: Ophthalmology;  Laterality: Right;  CDE: 15.67   CATARACT EXTRACTION W/PHACO Left 12/18/2022   Procedure: CATARACT EXTRACTION PHACO AND INTRAOCULAR LENS PLACEMENT (IOC);  Surgeon: Harrie Agent, MD;  Location: [facility];  Service: Ophthalmology;  Laterality: Left;  CDE: 11.78   CHOLECYSTECTOMY  2008   COLONOSCOPY  04/30/2011   Procedure: COLONOSCOPY;  Surgeon: Claudis RAYMOND Rivet, MD;  Location: [facility];  Service: Endoscopy;  Laterality: N/A;   COLONOSCOPY  05/25/2012   Procedure: COLONOSCOPY;  Surgeon: Claudis RAYMOND Rivet, MD;  Location: [facility];  Service: Endoscopy;  Laterality: N/A;  1:25-changed to 1200 Ann to notify pt   COLONOSCOPY Bilateral 12/2017   COLONOSCOPY WITH PROPOFOL  N/A 11/29/2020   Procedure: COLONOSCOPY WITH PROPOFOL ;  Surgeon: Eartha Angelia Sieving, MD;  Location: [facility];  Service: Gastroenterology;  Laterality: N/A;  1:35 patient is in the [facility]   ESOPHAGOGASTRODUODENOSCOPY N/A 12/07/2019   Procedure: ESOPHAGOGASTRODUODENOSCOPY (EGD);  Surgeon: Rivet Claudis RAYMOND, MD;  Location: [facility];  Service: Endoscopy;  Laterality: N/A;  155   GALLBLADDER SURGERY     right breast cystectomy  1988   TUBAL LIGATION  1975    Social History   Socioeconomic  History   Marital status: Divorced    Spouse name: Not on file   Number of children: 4   Years of education: 9   Highest education level: Not on file  Occupational History   Occupation: retired   Tobacco Use   Smoking status: Former    Current packs/day: 0.00    Average packs/day: 1 pack/day for 40.0 years (40.0 ttl pk-yrs)    Types: Cigarettes    Start date: 04/03/1971    Quit date: 04/03/2011    Years since quitting: 13.1   Smokeless tobacco: Never  Vaping Use   Vaping status: Never Used  Substance and Sexual Activity    Alcohol use: No   Drug use: No   Sexual activity: Not Currently  Other Topics Concern   Not on file  Social History Narrative   01/17/20 lives at [facility], [place]   Social Drivers of Health   Financial Resource Strain: Not on file  Food Insecurity: Not on file  Transportation Needs: Not on file  Physical Activity: Not on file  Stress: Not on file  Social Connections: Not on file  Intimate Partner Violence: Not on file   Family History  Problem Relation Age of Onset   Diabetes Mother    Hyperlipidemia Mother    Hypertension Mother    Heart disease Father    Hyperlipidemia Father    Diabetes Sister    Hyperlipidemia Sister    Hypertension Sister    Cancer Brother    Heart disease Brother    Hyperlipidemia Brother    Hypertension Brother    Diabetes Sister       VITAL SIGNS BP 132/68   Pulse 64   Temp (!) 97.2 F (36.2 C)   Resp 18   Ht 5' 5 (1.651 m)   Wt 140 lb 12.8 oz (63.9 kg)   SpO2 95%   BMI 23.43 kg/m   Outpatient Encounter Medications as of 06/06/2024  Medication Sig   amLODipine  (NORVASC ) 5 MG tablet Take 5 mg by mouth daily.   aspirin EC 81 MG tablet Take 81 mg by mouth daily. Swallow whole.   Calcium  Carb-Cholecalciferol (CALCIUM  500 + D) 500-5 MG-MCG TABS Take 1 tablet by mouth daily.   cyanocobalamin  1000 MCG tablet Take 1,000 mcg by mouth 2 (two) times a week.   ferrous sulfate 325 (65 FE) MG EC tablet Take 325 mg by mouth daily.   folic acid  (V-R FOLIC ACID ) 400 MCG tablet Take 1 tablet (400 mcg total) by mouth every other day.   gabapentin  (NEURONTIN ) 100 MG capsule Take 100 mg by mouth 2 (two) times daily. (2100)   losartan (COZAAR) 25 MG tablet Take 25 mg by mouth daily.   NON FORMULARY Diet:Regular thin liquids  ALLERGIC TO FISH   omeprazole  (PRILOSEC) 40 MG capsule Take 40 mg by mouth daily at 6 (six) AM. (0600)   polyethylene glycol (MIRALAX  / GLYCOLAX ) 17 g packet Take 17 g by mouth daily.   Polyvinyl Alcohol-Povidone PF  1.4-0.6 % SOLN Apply 1 drop to eye in the morning and at bedtime.   rosuvastatin (CRESTOR) 20 MG tablet Take 20 mg by mouth daily.   senna-docusate (SENOKOT S) 8.6-50 MG tablet Take 1 tablet by mouth 2 (two) times daily.   sodium chloride  1 g tablet Take 1 g by mouth 2 (two) times daily with a meal.   [DISCONTINUED] gabapentin  (NEURONTIN ) 100 MG capsule Take 100 mg by mouth daily. (Patient not taking: Reported on  05/26/2024)   No facility-administered encounter medications on file as of 06/06/2024.     SIGNIFICANT DIAGNOSTIC EXAMS  LABS REVIEWED PREVIOUS     05-20-23: tsh 2.925 05-21-23: wbc 5.7; hgb 10.4; hct 34.0; mcv 95.2 plt 149    06-18-23: wbc 6.8; hgb 11.1; hct 36.1; mcv 95.3 plt 192; glucose 154; bun 18; creat 1.36; k+ 5.1; na++ 134; ca 9.3; gfr 40 protein 7.4; albumin 4.1 07-14-23: wbc 4.8; hgb 9.9; hct 31.0; mcv 92.0 plt 144; glucose 117; bun 18; creat 1.39; k+ 4.6; na++ 134; ca 9.0; gfr 39; d-dimer 1.18; CRP 0.8 08-13-23: wbc 6.7; hgb 10.2; hct 33.6; mcv 93.6 plt 141  09-24-23: tsh 2.020 free t3: 1.8 free t4: 0.98 10-08-23: wbc 5.7; hgb 9.4; hct 31.2; mcv 95.7 plt 178; protein 6.7 albumin 3.5 vitamin D  44.18 10-28-23: wbc 8.3; hgb 10.5; hct 34.7 mcv 94.8 plt 158 01-21-24: wbc 6.9; hgb 10.8; hct 35.3; mcv 91.9 plt 175; glucose 118; bun 16; creat 1.61; k+ 4.5; na++ 139; ca 9.1; gfr 32; protein 7.6 albumin 3.9 iron  27; tibc 183 02-15-24: wbc 5.8; hgb 10.1; hct 33.0; mcv 90.9 plt 187 glucose 88; bun 15; creat 1.47; k+ 4.9; na++ 138; ca 8.9; gfr 36; phos 4.0 albumin 3.5  TODAY  04-14-24: wbc 8.4; hgb 10.9; hct 35.4; mcv 91.9 plt 164 05-26-24: wbc 7.7; hgb 11.0; hct 36.2; mcv 91.2 plt 181       Review of Systems  Constitutional:  Negative for malaise/fatigue.  Respiratory:  Negative for cough and shortness of breath.   Cardiovascular:  Negative for chest pain, palpitations and leg swelling.  Gastrointestinal:  Negative for abdominal pain, constipation and heartburn.  Musculoskeletal:   Negative for back pain, joint pain and myalgias.  Skin: Negative.   Neurological:  Negative for dizziness.  Psychiatric/Behavioral:  The patient is not nervous/anxious.     Physical Exam Constitutional:      General: She is not in acute distress.    Appearance: She is well-developed. She is not diaphoretic.  Neck:     Thyroid : No thyromegaly.  Cardiovascular:     Rate and Rhythm: Normal rate and regular rhythm.     Heart sounds: Normal heart sounds.  Pulmonary:     Effort: Pulmonary effort is normal. No respiratory distress.     Breath sounds: Normal breath sounds.  Abdominal:     General: Bowel sounds are normal. There is no distension.     Palpations: Abdomen is soft.     Tenderness: There is no abdominal tenderness.  Musculoskeletal:        General: Normal range of motion.     Cervical back: Neck supple.     Right lower leg: No edema.     Left lower leg: No edema.  Lymphadenopathy:     Cervical: No cervical adenopathy.  Skin:    General: Skin is warm and dry.  Neurological:     Mental Status: She is alert. Mental status is at baseline.  Psychiatric:        Mood and Affect: Mood normal.         ASSESSMENT/ PLAN:  TODAY   Aortic atherosclerosis: (ct 12-23-19): is on statin will monitor  2. 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 Chronic obstructive pulmonary disease unspecified COPD type: will monitor   PREVIOUS    4. Hypertension associated with stage 3 chronic kidney disease due to type 2 diabetes mellitus:  b/p 132/68: will continue cozaar 25 mg daily   5. CKD stage 3 due to type 2 diabetes mellitus: bun 18; creat 1.39 gfr 39   6. Diabetic peripheral neuropathy associated with type 2 diabetes mellitus: is presently managed; will continue gabapentin  100 mg in the AM and 200 mg in the PM  7. Type 2 diabetes mellitus with stage 3 chronic kidney disease without long term current use of insulin hgb A1c  5.3 is on asa; statin; arb   8. Hyperlipidemia associated with type 2 diabetes mellitus: ldl 63 will continue crestor 20 mg daily   9. Anemia due to chronic renal failure with erythropoietin  stage 3: hgb 10.1; will continue to monitor is followed by oncology  10. Other chronic gastritis without hemorrhage: will continue prilosec 40 mg daily   11. Increased intraocular pressure bilateral: will continue to monitor    12. Vitamin B12 deficiency: level 512; folate 32.8 is on folic acid  every other day and vitamin B12: 1000 mcg twice weekly   13. Vitamin D  deficiency: level is 44.18 will monitor   14. Thyroiditis: tsh 2.020 will monitor   15. Cervical myelopathy: is without change; wears gloves due to cold hands  16. Thrombocytopenia: plt 181 will monitor   17. Failure to thrive in adult: she is gaining weight to 140 pounds will monitor     Barnie Seip NP [facility]  call [phone]

## 2024-06-12 ENCOUNTER — Encounter: Payer: Self-pay | Admitting: Adult Health

## 2024-06-12 ENCOUNTER — Non-Acute Institutional Stay (SKILLED_NURSING_FACILITY): Admitting: Adult Health

## 2024-06-12 DIAGNOSIS — Z0000 Encounter for general adult medical examination without abnormal findings: Secondary | ICD-10-CM

## 2024-06-12 NOTE — Patient Instructions (Signed)
 Ms. Merlino,  Thank you for taking the time for your Medicare Wellness Visit. I appreciate your continued commitment to your health goals. Please review the care plan we discussed, and feel free to reach out if I can assist you further.  Please note that Annual Wellness Visits do not include a physical exam. Some assessments may be limited, especially if the visit was conducted virtually. If needed, we may recommend an in-person follow-up with your provider.  Ongoing Care Seeing your primary care provider every 3 to 6 months helps us  monitor your health and provide consistent, personalized care.   Referrals If a referral was made during today's visit and you haven't received any updates within two weeks, please contact the referred provider directly to check on the status.  Recommended Screenings:  Health Maintenance  Topic Date Due   Yearly kidney health urinalysis for diabetes  11/07/2021   Hemoglobin A1C  07/02/2023   Medicare Annual Wellness Visit  05/24/2024   Flu Shot  07/12/2024*   Complete foot exam   06/12/2025*   Eye exam for diabetics  06/12/2025*   Yearly kidney function blood test for diabetes  02/14/2025   DTaP/Tdap/Td vaccine (2 - Td or Tdap) 05/23/2031   Pneumococcal Vaccine for age over 65  Completed   Osteoporosis screening with Bone Density Scan  Completed   Hepatitis C Screening  Completed   Zoster (Shingles) Vaccine  Completed   Meningitis B Vaccine  Aged Out   Screening for Lung Cancer  Discontinued   Breast Cancer Screening  Discontinued   Colon Cancer Screening  Discontinued   COVID-19 Vaccine  Discontinued  *Topic was postponed. The date shown is not the original due date.       06/12/2024    1:42 PM  Advanced Directives  Does Patient Have a Medical Advance Directive? Yes  Type of Estate Agent of [place];Out of facility DNR (pink MOST or yellow form)  Copy of Healthcare Power of Attorney in Chart? Yes - validated most recent copy  scanned in chart (See row information)  Would patient like information on creating a medical advance directive? No - Patient declined  Pre-existing out of facility DNR order (yellow form or pink MOST form) Pink MOST/Yellow Form most recent copy in chart - Physician notified to receive inpatient order    Vision: Annual vision screenings are recommended for early detection of glaucoma, cataracts, and diabetic retinopathy. These exams can also reveal signs of chronic conditions such as diabetes and high blood pressure.  Dental: Annual dental screenings help detect early signs of oral cancer, gum disease, and other conditions linked to overall health, including heart disease and diabetes.  Please see the attached documents for additional preventive care recommendations.

## 2024-06-12 NOTE — Progress Notes (Signed)
 Chief Complaint  Patient presents with   Medicare Wellness    Annual wellness visit. Discuss the need for A1c and diabetic kidney evaluation. Patient declined eye and foot exam.       Subjective:   Traci Parker is a 78 y.o. female who presents for a Medicare Annual Wellness Visit.  Visit info / Clinical Intake: Medicare Wellness Visit Type:: Subsequent Annual Wellness Visit Persons participating in visit and providing information:: patient Medicare Wellness Visit Mode:: In-person (required for WTM) Interpreter Needed?: No Pre-visit prep was completed: yes AWV questionnaire completed by patient prior to visit?: no Living arrangements:: in nursing facility Patient's Overall Health Status Rating: (!) fair Typical amount of pain: none Does pain affect daily life?: no Are you currently prescribed opioids?: no  Dietary Habits and Nutritional Risks How many meals a day?: 3 Eats fruit and vegetables daily?: yes Most meals are obtained by: having others provide food Diabetic:: (!) yes Any non-healing wounds?: no How often do you check your BS?: 3 Would you like to be referred to a Nutritionist or for Diabetic Management? : no  Functional Status Activities of Daily Living (to include ambulation/medication): (!) Dependent Feeding: Needs assistance Dressing/Grooming: Dependent Bathing: Dependent Toileting: Dependent Transfer: Dependent Ambulation: Dependent Medication Administration: Dependent Is this a change from baseline?: Pre-admission baseline Home Management: Dependent Manage your own finances?: (!) no Primary transportation is: facility / other Concerns about vision?: no *vision screening is required for WTM* Concerns about hearing?: no  Fall Screening Falls in the past year?: 0 Number of falls in past year: 0 Was there an injury with Fall?: 0 Fall Risk Category Calculator: 0 Patient Fall Risk Level: Low Fall Risk  Fall Risk Patient at Risk for Falls Due to:  Impaired balance/gait; Impaired mobility  Home and Transportation Safety: All rugs have non-skid backing?: N/A, no rugs All stairs or steps have railings?: yes Grab bars in the bathtub or shower?: yes Have non-skid surface in bathtub or shower?: yes Good home lighting?: yes Regular seat belt use?: -- (n/a) Hospital stays in the last year:: no  Cognitive Assessment Difficulty concentrating, remembering, or making decisions? : no Will 6CIT or Mini Cog be Completed: no 6CIT or Mini Cog Declined: patient has a diagnosis of dementia or cognitive impairment  Advance Directives (For Healthcare) Does Patient Have a Medical Advance Directive?: Yes Does patient want to make changes to medical advance directive?: No - Patient declined Type of Advance Directive: Healthcare Power of [place]; Out of facility DNR (pink MOST or yellow form) Copy of Healthcare Power of Attorney in Chart?: Yes - validated most recent copy scanned in chart (See row information) Copy of Living Will in Chart?: Yes - validated most recent copy scanned in chart (See row information) Out of facility DNR (pink MOST or yellow form) in Chart? (Ambulatory ONLY): Yes - validated most recent copy scanned in chart Pre-existing out of facility DNR order (yellow form or pink MOST form): Pink MOST/Yellow Form most recent copy in chart - Physician notified to receive inpatient order Would patient like information on creating a medical advance directive?: No - Patient declined  Reviewed/Updated  Reviewed/Updated: Reviewed All (Medical, Surgical, Family, Medications, Allergies, Care Teams, Patient Goals)    Allergies (verified) Latex, Levofloxacin, Neomycin-bacitracin-polymyxin  [bacitracin-neomycin-polymyxin], Other, Prednisone, Fish allergy, Shellfish allergy, and Neosporin [neomycin-polymyxin-gramicidin]   Current Medications (verified) Outpatient Encounter Medications as of 06/12/2024  Medication Sig   amLODipine  (NORVASC ) 5 MG  tablet Take 5 mg by mouth daily.  aspirin EC 81 MG tablet Take 81 mg by mouth daily. Swallow whole.   Calcium  Carb-Cholecalciferol (CALCIUM  500 + D) 500-5 MG-MCG TABS Take 1 tablet by mouth daily.   cyanocobalamin  1000 MCG tablet Take 1,000 mcg by mouth every other day.   ferrous sulfate 325 (65 FE) MG EC tablet Take 325 mg by mouth every other day.   folic acid  (V-R FOLIC ACID ) 400 MCG tablet Take 1 tablet (400 mcg total) by mouth every other day.   gabapentin  (NEURONTIN ) 100 MG capsule Take 100 mg by mouth 2 (two) times daily. (2100)   losartan (COZAAR) 25 MG tablet Take 25 mg by mouth daily.   magnesium  hydroxide (MILK OF MAGNESIA) 400 MG/5ML suspension Take 30 mLs by mouth daily as needed for mild constipation.   NON FORMULARY Diet:Regular thin liquids  ALLERGIC TO FISH   omeprazole  (PRILOSEC) 40 MG capsule Take 40 mg by mouth daily at 6 (six) AM. (0600)   polyethylene glycol (MIRALAX  / GLYCOLAX ) 17 g packet Take 17 g by mouth every other day.   Polyvinyl Alcohol-Povidone PF 1.4-0.6 % SOLN Apply 1 drop to eye 2 (two) times daily as needed.   rosuvastatin (CRESTOR) 20 MG tablet Take 20 mg by mouth daily.   senna-docusate (SENOKOT S) 8.6-50 MG tablet Take 1 tablet by mouth 2 (two) times daily.   sodium chloride  1 g tablet Take 1 g by mouth 2 (two) times daily with a meal.   No facility-administered encounter medications on file as of 06/12/2024.    History: Past Medical History:  Diagnosis Date   Allergy    Anemia    pernicious anemia   Arthritis    Asthma    Blood transfusion without reported diagnosis    CKD (chronic kidney disease)    COPD (chronic obstructive pulmonary disease) (HCC) 12/23/2019   Diabetic peripheral neuropathy associated with type 2 diabetes mellitus (HCC) 04/07/2021   DM (diabetes mellitus), type 2 with peripheral vascular complications (HCC) 12/23/2019   Gastritis    GERD (gastroesophageal reflux disease)    Glaucoma    History of blood in urine     Hyperlipidemia    Hypertension    Neuropathy    both feet   Ocular hypertension    Osteoporosis    Stroke (HCC)    Vitamin B12 deficiency anemia due to intrinsic factor deficiency    Past Surgical History:  Procedure Laterality Date   CATARACT EXTRACTION W/PHACO Right 11/27/2022   Procedure: CATARACT EXTRACTION PHACO AND INTRAOCULAR LENS PLACEMENT (IOC);  Surgeon: Harrie Agent, MD;  Location: [facility];  Service: Ophthalmology;  Laterality: Right;  CDE: 15.67   CATARACT EXTRACTION W/PHACO Left 12/18/2022   Procedure: CATARACT EXTRACTION PHACO AND INTRAOCULAR LENS PLACEMENT (IOC);  Surgeon: Harrie Agent, MD;  Location: [facility];  Service: Ophthalmology;  Laterality: Left;  CDE: 11.78   CHOLECYSTECTOMY  2008   COLONOSCOPY  04/30/2011   Procedure: COLONOSCOPY;  Surgeon: Claudis RAYMOND Rivet, MD;  [facility];  Service: Endoscopy;  Laterality: N/A;   COLONOSCOPY  05/25/2012   Procedure: COLONOSCOPY;  Surgeon: Claudis RAYMOND Rivet, MD;  [facility];  Service: Endoscopy;  Laterality: N/A;  1:25-changed to 1200 Ann to notify pt   COLONOSCOPY Bilateral 12/2017   COLONOSCOPY WITH PROPOFOL  N/A 11/29/2020   Procedure: COLONOSCOPY WITH PROPOFOL ;  Surgeon: Eartha Angelia Sieving, MD;  [facility];  Service: Gastroenterology;  Laterality: N/A;  1:35 [facility] is in the [facility]   ESOPHAGOGASTRODUODENOSCOPY N/A 12/07/2019  Procedure: ESOPHAGOGASTRODUODENOSCOPY (EGD);  Surgeon: Golda Claudis PENNER, MD;  [facility];  Service: Endoscopy;  Laterality: N/A;  155   GALLBLADDER SURGERY     right breast cystectomy  1988   TUBAL LIGATION  1975   Family History  Problem Relation Age of Onset   Diabetes Mother    Hyperlipidemia Mother    Hypertension Mother    Heart disease Father    Hyperlipidemia Father    Diabetes Sister    Hyperlipidemia Sister    Hypertension Sister    Cancer Brother    Heart disease Brother    Hyperlipidemia Brother    Hypertension  Brother    Diabetes Sister    Social History   Occupational History   Occupation: retired   Tobacco Use   Smoking status: Former    Current packs/day: 0.00    Average packs/day: 1 pack/day for 40.0 years (40.0 ttl pk-yrs)    Types: Cigarettes    Start date: 04/03/1971    Quit date: 04/03/2011    Years since quitting: 13.2   Smokeless tobacco: Never  Vaping Use   Vaping status: Never Used  Substance and Sexual Activity   Alcohol use: No   Drug use: No   Sexual activity: Not Currently   Tobacco Counseling Counseling given: Not Answered  SDOH Screenings   Depression (PHQ2-9): Low Risk  (06/12/2024)  Tobacco Use: Medium Risk (06/12/2024)   See flowsheets for full screening details  Depression Screen PHQ 2 & 9 Depression Scale- Over the past 2 weeks, how often have you been bothered by any of the following problems? Little interest or pleasure in doing things: 0 Feeling down, depressed, or hopeless (PHQ Adolescent also includes...irritable): 0 PHQ-2 Total Score: 0     Goals Addressed             This Visit's Progress    Absence of Fall and Fall-Related Injury   On track    Evidence-based guidance:  Assess fall risk using a validated tool when available. Consider balance and gait impairment, muscle weakness, diminished vision or hearing, environmental hazards, presence of urinary or bowel urgency and/or incontinence.  Communicate fall injury risk to interprofessional healthcare team.  Develop a fall prevention plan with the patient and family.  Promote use of personal vision and auditory aids.  Promote reorientation, appropriate sensory stimulation, and routines to decrease risk of fall when changes in mental status are present.  Assess assistance level required for safe and effective self-care; consider referral for home care.  Encourage physical activity, such as performance of self-care at highest level of ability, strength and balance exercise program, and provision  of appropriate assistive devices; refer to rehabilitation therapy.  Refer to community-based fall prevention program where available.  If fall occurs, determine the cause and revise fall injury prevention plan.  Regularly review medication contribution to fall risk; consider risk related to polypharmacy and age.  Refer to pharmacist for consultation when concerns about medications are revealed.  Balance adequate pain management with potential for oversedation.  Provide guidance related to environmental modifications.  Consider supplementation with Vitamin D .   Notes:      Follow up with Primary Care Provider   On track    General - Client will not be readmitted within 30 days ([facility])   On track            Objective:    Today's Vitals   06/12/24 1044  BP: 120/64  Pulse: 72  Resp: 18  Temp: 98.7 F (37.1 C)  SpO2: 98%  Weight: 140 lb 12.8 oz (63.9 kg)  Height: 5' 5 (1.651 m)   Body mass index is 23.43 kg/m.  Hearing/Vision screen No results found. Immunizations and Health Maintenance Health Maintenance  Topic Date Due   Diabetic kidney evaluation - Urine ACR  11/07/2021   HEMOGLOBIN A1C  07/02/2023   Medicare Annual Wellness (AWV)  05/24/2024   Influenza Vaccine  07/12/2024 (Originally 02/11/2024)   FOOT EXAM  06/12/2025 (Originally 02/18/2024)   OPHTHALMOLOGY EXAM  06/12/2025 (Originally 09/23/2023)   Diabetic kidney evaluation - eGFR measurement  02/14/2025   DTaP/Tdap/Td (2 - Td or Tdap) 05/23/2031   Pneumococcal Vaccine: 50+ Years  Completed   Bone Density Scan  Completed   Hepatitis C Screening  Completed   Zoster Vaccines- Shingrix  Completed   Meningococcal B Vaccine  Aged Out   Lung Cancer Screening  Discontinued   Mammogram  Discontinued   Colonoscopy  Discontinued   COVID-19 Vaccine  Discontinued        Assessment/Plan:  This is a routine wellness examination for [place].  Patient Care Team: Landy Barnie RAMAN, NP as PCP - [facility]  I have personally reviewed and noted the following in the patient's chart:   Medical and social history Use of alcohol, tobacco or illicit drugs  Current medications and supplements including opioid prescriptions. Functional ability and status Nutritional status Physical activity Advanced directives List of other physicians Hospitalizations, surgeries, and ER visits in previous 12 months Vitals Screenings to include cognitive, depression, and falls Referrals and appointments  No orders of the defined types were placed in this encounter.  In addition, I have reviewed and discussed with patient certain preventive protocols, quality metrics, and best practice recommendations. A written personalized care plan for preventive services as well as general preventive health recommendations were provided to patient.   Barnie RAMAN Landy, NP   06/12/2024   No follow-ups on file.  After Visit Summary:   Nurse Notes: exam performed at this facility by myself.

## 2024-06-15 ENCOUNTER — Other Ambulatory Visit (HOSPITAL_COMMUNITY)
Admission: RE | Admit: 2024-06-15 | Discharge: 2024-06-15 | Disposition: A | Source: Skilled Nursing Facility | Attending: Adult Health | Admitting: Adult Health

## 2024-06-15 DIAGNOSIS — E069 Thyroiditis, unspecified: Secondary | ICD-10-CM | POA: Insufficient documentation

## 2024-06-15 LAB — T4, FREE: Free T4: 0.96 ng/dL (ref 0.61–1.12)

## 2024-06-15 LAB — TSH: TSH: 3.25 u[IU]/mL (ref 0.350–4.500)

## 2024-06-16 LAB — T3, FREE: T3, Free: 2.1 pg/mL (ref 2.0–4.4)

## 2024-07-05 ENCOUNTER — Non-Acute Institutional Stay (SKILLED_NURSING_FACILITY): Payer: Self-pay | Admitting: Adult Health

## 2024-07-05 ENCOUNTER — Encounter: Payer: Self-pay | Admitting: Adult Health

## 2024-07-05 DIAGNOSIS — J449 Chronic obstructive pulmonary disease, unspecified: Secondary | ICD-10-CM | POA: Diagnosis not present

## 2024-07-05 DIAGNOSIS — Z86718 Personal history of other venous thrombosis and embolism: Secondary | ICD-10-CM

## 2024-07-05 DIAGNOSIS — I700 Atherosclerosis of aorta: Secondary | ICD-10-CM | POA: Diagnosis not present

## 2024-07-05 NOTE — Progress Notes (Signed)
 " Location:  [facility] Room Number: 150 P Place of Service:  SNF (31)   CODE STATUS: DNR  Allergies[1]  Chief Complaint  Patient presents with   Medical Management of Chronic Issues            Aortic atherosclerosis:History of left lower extremity dvt Chronic obstructive pulmonary disease unspecified COPD type:    HPI:  She is a 78 y.o. long term resident of this facility being seen for the management of her chronic illnesses:Aortic atherosclerosis:History of left lower extremity dvt Chronic obstructive pulmonary disease unspecified COPD type. She is slowly gaining weight. She denies any cough or shortness of breath.    Past Medical History:  Diagnosis Date   Allergy    Anemia    pernicious anemia   Arthritis    Asthma    Blood transfusion without reported diagnosis    CKD (chronic kidney disease)    COPD (chronic obstructive pulmonary disease) (HCC) 12/23/2019   Diabetic peripheral neuropathy associated with type 2 diabetes mellitus (HCC) 04/07/2021   DM (diabetes mellitus), type 2 with peripheral vascular complications (HCC) 12/23/2019   Gastritis    GERD (gastroesophageal reflux disease)    Glaucoma    History of blood in urine    Hyperlipidemia    Hypertension    Neuropathy    both feet   Ocular hypertension    Osteoporosis    Stroke (HCC)    Vitamin B12 deficiency anemia due to intrinsic factor deficiency     Past Surgical History:  Procedure Laterality Date   CATARACT EXTRACTION W/PHACO Right 11/27/2022   Procedure: CATARACT EXTRACTION PHACO AND INTRAOCULAR LENS PLACEMENT (IOC);  Surgeon: Harrie Agent, MD;  [facility]: [facility];  Service: Ophthalmology;  Laterality: Right;  CDE: 15.67   CATARACT EXTRACTION W/PHACO Left 12/18/2022   Procedure: CATARACT EXTRACTION PHACO AND INTRAOCULAR LENS PLACEMENT (IOC);  Surgeon: Harrie Agent, MD;  Location: [facility];  Service: Ophthalmology;  Laterality: Left;  CDE: 11.78   CHOLECYSTECTOMY  2008    COLONOSCOPY  04/30/2011   Procedure: COLONOSCOPY;  Surgeon: Claudis RAYMOND Rivet, MD;  [facility];  Service: Endoscopy;  Laterality: N/A;   COLONOSCOPY  05/25/2012   Procedure: COLONOSCOPY;  Surgeon: Claudis RAYMOND Rivet, MD;  [facility];  Service: Endoscopy;  Laterality: N/A;  1:25-changed to 1200 Ann to notify pt   COLONOSCOPY Bilateral 12/2017   COLONOSCOPY WITH PROPOFOL  N/A 11/29/2020   Procedure: COLONOSCOPY WITH PROPOFOL ;  Surgeon: Eartha Angelia Sieving, MD;  [facility];  Service: Gastroenterology;  Laterality: N/A;  1:35 [facility] is in the [facility]   ESOPHAGOGASTRODUODENOSCOPY N/A 12/07/2019   Procedure: ESOPHAGOGASTRODUODENOSCOPY (EGD);  Surgeon: Rivet Claudis RAYMOND, MD;  [facility];  Service: Endoscopy;  Laterality: N/A;  155   GALLBLADDER SURGERY     right breast cystectomy  1988   TUBAL LIGATION  1975    Social History   Socioeconomic History   Marital status: Divorced    Spouse name: Not on file   Number of children: 4   Years of education: 9   Highest education level: Not on file  Occupational History   Occupation: retired   Tobacco Use   Smoking status: Former    Current packs/day: 0.00    Average packs/day: 1 pack/day for 40.0 years (40.0 ttl pk-yrs)    Types: Cigarettes    Start date: 04/03/1971    Quit date: 04/03/2011    Years since quitting: 13.2  Smokeless tobacco: Never  Vaping Use   Vaping status: Never Used  Substance and Sexual Activity   Alcohol use: No   Drug use: No   Sexual activity: Not Currently  Other Topics Concern   Not on file  Social History Narrative   01/17/20 lives at [facility], [place]   Social Drivers of Health   Tobacco Use: Medium Risk (07/05/2024)   Patient History    Smoking Tobacco Use: Former    Smokeless Tobacco Use: Never    Passive Exposure: Not on [place] Strain: Not on file  Food Insecurity: Not on file  Transportation Needs: Not on file  Physical  Activity: Not on file  Stress: Not on file  Social Connections: Not on file  Intimate Partner Violence: Not on file  Depression (PHQ2-9): Low Risk (06/12/2024)   Depression (PHQ2-9)    PHQ-2 Score: 0  Alcohol Screen: Not on file  Housing: Not on file  Utilities: Not on file  Health Literacy: Not on file   Family History  Problem Relation Age of Onset   Diabetes Mother    Hyperlipidemia Mother    Hypertension Mother    Heart disease Father    Hyperlipidemia Father    Diabetes Sister    Hyperlipidemia Sister    Hypertension Sister    Cancer Brother    Heart disease Brother    Hyperlipidemia Brother    Hypertension Brother    Diabetes Sister       VITAL SIGNS BP 124/68   Temp 98.4 F (36.9 C)   Ht 5' 5 (1.651 m)   Wt 148 lb (67.1 kg)   SpO2 97%   BMI 24.63 kg/m   Outpatient Encounter Medications as of 07/05/2024  Medication Sig   amLODipine  (NORVASC ) 5 MG tablet Take 5 mg by mouth daily.   aspirin EC 81 MG tablet Take 81 mg by mouth daily. Swallow whole.   Calcium  Carb-Cholecalciferol (CALCIUM  500 + D) 500-5 MG-MCG TABS Take 1 tablet by mouth daily.   cyanocobalamin  1000 MCG tablet Take 1,000 mcg by mouth every other day.   ferrous sulfate 325 (65 FE) MG EC tablet Take 325 mg by mouth every other day.   folic acid  (V-R FOLIC ACID ) 400 MCG tablet Take 1 tablet (400 mcg total) by mouth every other day.   gabapentin  (NEURONTIN ) 100 MG capsule Take 100 mg by mouth 2 (two) times daily. (2100)   losartan (COZAAR) 25 MG tablet Take 25 mg by mouth daily.   magnesium  hydroxide (MILK OF MAGNESIA) 400 MG/5ML suspension Take 30 mLs by mouth daily as needed for mild constipation.   NON FORMULARY Diet:Regular thin liquids  ALLERGIC TO FISH   omeprazole  (PRILOSEC) 40 MG capsule Take 40 mg by mouth daily at 6 (six) AM. (0600)   polyethylene glycol (MIRALAX  / GLYCOLAX ) 17 g packet Take 17 g by mouth every other day.   Polyvinyl Alcohol-Povidone PF 1.4-0.6 % SOLN Apply 1 drop to  eye 2 (two) times daily as needed.   rosuvastatin (CRESTOR) 20 MG tablet Take 20 mg by mouth daily.   senna-docusate (SENOKOT S) 8.6-50 MG tablet Take 1 tablet by mouth 2 (two) times daily.   sodium chloride  1 g tablet Take 1 g by mouth 2 (two) times daily with a meal.   No facility-administered encounter medications on file as of 07/05/2024.     SIGNIFICANT DIAGNOSTIC EXAMS  LABS REVIEWED PREVIOUS      06-18-23: wbc 6.8; hgb 11.1;  hct 36.1; mcv 95.3 plt 192; glucose 154; bun 18; creat 1.36; k+ 5.1; na++ 134; ca 9.3; gfr 40 protein 7.4; albumin 4.1 07-14-23: wbc 4.8; hgb 9.9; hct 31.0; mcv 92.0 plt 144; glucose 117; bun 18; creat 1.39; k+ 4.6; na++ 134; ca 9.0; gfr 39; d-dimer 1.18; CRP 0.8 08-13-23: wbc 6.7; hgb 10.2; hct 33.6; mcv 93.6 plt 141  09-24-23: tsh 2.020 free t3: 1.8 free t4: 0.98 10-08-23: wbc 5.7; hgb 9.4; hct 31.2; mcv 95.7 plt 178; protein 6.7 albumin 3.5 vitamin D  44.18 10-28-23: wbc 8.3; hgb 10.5; hct 34.7 mcv 94.8 plt 158 01-21-24: wbc 6.9; hgb 10.8; hct 35.3; mcv 91.9 plt 175; glucose 118; bun 16; creat 1.61; k+ 4.5; na++ 139; ca 9.1; gfr 32; protein 7.6 albumin 3.9 iron  27; tibc 183 02-15-24: wbc 5.8; hgb 10.1; hct 33.0; mcv 90.9 plt 187 glucose 88; bun 15; creat 1.47; k+ 4.9; na++ 138; ca 8.9; gfr 36; phos 4.0 albumin 3.5  TODAY  04-14-24: wbc 8.4; hgb 10.9; hct 35.4; mcv 91.9 plt 164 05-26-24: wbc 7.7; hgb 11.0; hct 36.2; mcv 91.2 plt 181 06-15-24: tsh 3.250 free t3: 2.1 free t4: 0.96       Review of Systems  Constitutional:  Negative for malaise/fatigue.  Respiratory:  Negative for cough and shortness of breath.   Cardiovascular:  Negative for chest pain, palpitations and leg swelling.  Gastrointestinal:  Negative for abdominal pain, constipation and heartburn.  Musculoskeletal:  Negative for back pain, joint pain and myalgias.  Skin: Negative.   Neurological:  Negative for dizziness.  Psychiatric/Behavioral:  The patient is not nervous/anxious.     Physical  Exam Constitutional:      General: She is not in acute distress.    Appearance: She is well-developed. She is not diaphoretic.  Neck:     Thyroid : No thyromegaly.  Cardiovascular:     Rate and Rhythm: Normal rate and regular rhythm.     Heart sounds: Normal heart sounds.  Pulmonary:     Effort: Pulmonary effort is normal. No respiratory distress.     Breath sounds: Normal breath sounds.  Abdominal:     General: Bowel sounds are normal. There is no distension.     Palpations: Abdomen is soft.     Tenderness: There is no abdominal tenderness.  Musculoskeletal:        General: Normal range of motion.     Cervical back: Neck supple.     Right lower leg: No edema.     Left lower leg: No edema.  Lymphadenopathy:     Cervical: No cervical adenopathy.  Skin:    General: Skin is warm and dry.  Neurological:     Mental Status: She is alert. Mental status is at baseline.  Psychiatric:        Mood and Affect: Mood normal.         ASSESSMENT/ PLAN:  TODAY   Aortic atherosclerosis: (ct 12-23-19): is on statin will monitor  2. 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 Chronic obstructive pulmonary disease unspecified COPD type: will monitor   PREVIOUS    4. Hypertension associated with stage 3 chronic kidney disease due to type 2 diabetes mellitus: b/p 124/68: will continue cozaar 25 mg daily   5. CKD stage 3 due to type 2 diabetes mellitus: bun 18; creat 1.39 gfr 39   6. Diabetic peripheral neuropathy associated with type 2 diabetes mellitus: is presently  managed; will continue gabapentin  100 mg in the AM and 200 mg in the PM  7. Type 2 diabetes mellitus with stage 3 chronic kidney disease without long term current use of insulin hgb A1c 5.3 is on asa; statin; arb   8. Hyperlipidemia associated with type 2 diabetes mellitus: ldl 63 will continue crestor 20 mg daily   9. Anemia due to chronic renal failure with  erythropoietin  stage 3: hgb 10.1; will continue to monitor is followed by oncology  10. Other chronic gastritis without hemorrhage: will continue prilosec 40 mg daily   11. Increased intraocular pressure bilateral: will continue to monitor    12. Vitamin B12 deficiency: level 512; folate 32.8 is on folic acid  every other day and vitamin B12: 1000 mcg twice weekly   13. Vitamin D  deficiency: level is 44.18 will monitor   14. Thyroiditis: tsh 3.250; free t3: 2.1 free t4: 0.96 will monitor   15. Cervical myelopathy: is without change; wears gloves due to cold hands  16. Thrombocytopenia: plt 181 will monitor             Barnie Seip NP [facility]   call [phone]       [1]  Allergies Allergen Reactions   Latex Rash   Levofloxacin Nausea And Vomiting   Neomycin-Bacitracin-Polymyxin  [Bacitracin-Neomycin-Polymyxin] Rash   Other Swelling and Rash    TOMATO  SOUP-PER MAR   Prednisone Hives, Itching, Rash and Other (See Comments)   Fish Allergy     rash   Shellfish Allergy Hives   Neosporin [Neomycin-Polymyxin-Gramicidin] Itching and Rash   "

## 2024-07-07 ENCOUNTER — Other Ambulatory Visit (HOSPITAL_COMMUNITY)
Admission: RE | Admit: 2024-07-07 | Discharge: 2024-07-07 | Disposition: A | Discharge status: Home / Self Care | Source: Skilled Nursing Facility | Attending: Geriatric Medicine | Admitting: Geriatric Medicine

## 2024-07-07 DIAGNOSIS — E1142 Type 2 diabetes mellitus with diabetic polyneuropathy: Secondary | ICD-10-CM | POA: Diagnosis present

## 2024-07-07 LAB — RESP PANEL BY RT-PCR (RSV, FLU A&B, COVID)  RVPGX2
Influenza A by PCR: NEGATIVE
Influenza B by PCR: NEGATIVE
Resp Syncytial Virus by PCR: NEGATIVE
SARS Coronavirus 2 by RT PCR: NEGATIVE

## 2024-07-10 ENCOUNTER — Other Ambulatory Visit (HOSPITAL_COMMUNITY)
Admission: RE | Admit: 2024-07-10 | Discharge: 2024-07-10 | Disposition: A | Source: Skilled Nursing Facility | Attending: Adult Health | Admitting: Adult Health

## 2024-07-10 ENCOUNTER — Encounter: Payer: Self-pay | Admitting: *Deleted

## 2024-07-10 DIAGNOSIS — E1122 Type 2 diabetes mellitus with diabetic chronic kidney disease: Secondary | ICD-10-CM | POA: Diagnosis present

## 2024-07-10 LAB — LIPID PANEL
Cholesterol: 142 mg/dL (ref 0–200)
HDL: 45 mg/dL
LDL Cholesterol: 74 mg/dL (ref 0–99)
Total CHOL/HDL Ratio: 3.2 ratio
Triglycerides: 120 mg/dL
VLDL: 24 mg/dL (ref 0–40)

## 2024-07-11 LAB — MICROALBUMIN / CREATININE URINE RATIO
Creatinine, Urine: 71.9 mg/dL
Microalb Creat Ratio: 118 mg/g{creat} — ABNORMAL HIGH (ref 0–29)
Microalb, Ur: 84.9 ug/mL — ABNORMAL HIGH

## 2024-07-12 ENCOUNTER — Other Ambulatory Visit (HOSPITAL_COMMUNITY)
Admission: RE | Admit: 2024-07-12 | Discharge: 2024-07-12 | Disposition: A | Discharge status: Home / Self Care | Source: Skilled Nursing Facility | Attending: Adult Health | Admitting: Adult Health

## 2024-07-12 DIAGNOSIS — I129 Hypertensive chronic kidney disease with stage 1 through stage 4 chronic kidney disease, or unspecified chronic kidney disease: Secondary | ICD-10-CM | POA: Insufficient documentation

## 2024-07-12 LAB — BASIC METABOLIC PANEL WITH GFR
Anion gap: 12 (ref 5–15)
BUN: 16 mg/dL (ref 8–23)
CO2: 24 mmol/L (ref 22–32)
Calcium: 9 mg/dL (ref 8.9–10.3)
Chloride: 105 mmol/L (ref 98–111)
Creatinine, Ser: 1.73 mg/dL — ABNORMAL HIGH (ref 0.44–1.00)
GFR, Estimated: 29 mL/min — ABNORMAL LOW
Glucose, Bld: 82 mg/dL (ref 70–99)
Potassium: 5.2 mmol/L — ABNORMAL HIGH (ref 3.5–5.1)
Sodium: 141 mmol/L (ref 135–145)

## 2024-07-17 ENCOUNTER — Other Ambulatory Visit (HOSPITAL_COMMUNITY)
Admission: RE | Admit: 2024-07-17 | Discharge: 2024-07-17 | Disposition: A | Discharge status: Home / Self Care | Source: Skilled Nursing Facility | Attending: Adult Health | Admitting: Adult Health

## 2024-07-17 DIAGNOSIS — E1169 Type 2 diabetes mellitus with other specified complication: Secondary | ICD-10-CM | POA: Diagnosis present

## 2024-07-17 LAB — HEMOGLOBIN A1C
Hgb A1c MFr Bld: 5.9 % — ABNORMAL HIGH (ref 4.8–5.6)
Mean Plasma Glucose: 122.63 mg/dL

## 2024-07-18 ENCOUNTER — Encounter: Payer: Self-pay | Admitting: Adult Health

## 2024-07-19 LAB — MICROALBUMIN / CREATININE URINE RATIO
Creatinine, Urine: 70.1 mg/dL
Microalb Creat Ratio: 198 mg/g{creat} — ABNORMAL HIGH (ref 0–29)
Microalb, Ur: 138.5 ug/mL — ABNORMAL HIGH

## 2024-07-21 ENCOUNTER — Inpatient Hospital Stay

## 2024-07-21 ENCOUNTER — Inpatient Hospital Stay: Attending: Hematology

## 2024-07-21 VITALS — BP 150/85 | HR 82 | Temp 97.4°F | Resp 18

## 2024-07-21 DIAGNOSIS — N183 Chronic kidney disease, stage 3 unspecified: Secondary | ICD-10-CM

## 2024-07-21 DIAGNOSIS — D631 Anemia in chronic kidney disease: Secondary | ICD-10-CM | POA: Diagnosis present

## 2024-07-21 DIAGNOSIS — E538 Deficiency of other specified B group vitamins: Secondary | ICD-10-CM | POA: Insufficient documentation

## 2024-07-21 DIAGNOSIS — N1832 Chronic kidney disease, stage 3b: Secondary | ICD-10-CM | POA: Diagnosis present

## 2024-07-21 LAB — CBC WITH DIFFERENTIAL/PLATELET
Abs Immature Granulocytes: 0.02 K/uL (ref 0.00–0.07)
Basophils Absolute: 0 K/uL (ref 0.0–0.1)
Basophils Relative: 0 %
Eosinophils Absolute: 0.4 K/uL (ref 0.0–0.5)
Eosinophils Relative: 4 %
HCT: 35 % — ABNORMAL LOW (ref 36.0–46.0)
Hemoglobin: 10.6 g/dL — ABNORMAL LOW (ref 12.0–15.0)
Immature Granulocytes: 0 %
Lymphocytes Relative: 22 %
Lymphs Abs: 1.9 K/uL (ref 0.7–4.0)
MCH: 27.3 pg (ref 26.0–34.0)
MCHC: 30.3 g/dL (ref 30.0–36.0)
MCV: 90.2 fL (ref 80.0–100.0)
Monocytes Absolute: 0.7 K/uL (ref 0.1–1.0)
Monocytes Relative: 7 %
Neutro Abs: 5.8 K/uL (ref 1.7–7.7)
Neutrophils Relative %: 67 %
Platelets: 184 K/uL (ref 150–400)
RBC: 3.88 MIL/uL (ref 3.87–5.11)
RDW: 14.8 % (ref 11.5–15.5)
WBC: 8.8 K/uL (ref 4.0–10.5)
nRBC: 0 % (ref 0.0–0.2)

## 2024-07-21 LAB — COMPREHENSIVE METABOLIC PANEL WITH GFR
ALT: <5 U/L (ref 0–44)
AST: 13 U/L — ABNORMAL LOW (ref 15–41)
Albumin: 4.4 g/dL (ref 3.5–5.0)
Alkaline Phosphatase: 140 U/L — ABNORMAL HIGH (ref 38–126)
Anion gap: 14 (ref 5–15)
BUN: 17 mg/dL (ref 8–23)
CO2: 22 mmol/L (ref 22–32)
Calcium: 9.2 mg/dL (ref 8.9–10.3)
Chloride: 106 mmol/L (ref 98–111)
Creatinine, Ser: 1.76 mg/dL — ABNORMAL HIGH (ref 0.44–1.00)
GFR, Estimated: 29 mL/min — ABNORMAL LOW
Glucose, Bld: 100 mg/dL — ABNORMAL HIGH (ref 70–99)
Potassium: 4.6 mmol/L (ref 3.5–5.1)
Sodium: 143 mmol/L (ref 135–145)
Total Bilirubin: 0.3 mg/dL (ref 0.0–1.2)
Total Protein: 7.8 g/dL (ref 6.5–8.1)

## 2024-07-21 LAB — FERRITIN: Ferritin: 459 ng/mL — ABNORMAL HIGH (ref 11–307)

## 2024-07-21 LAB — IRON AND TIBC
Iron: 28 ug/dL (ref 28–170)
Saturation Ratios: 14 % (ref 10.4–31.8)
TIBC: 190 ug/dL — ABNORMAL LOW (ref 250–450)
UIBC: 163 ug/dL

## 2024-07-21 LAB — FOLATE: Folate: 19.4 ng/mL

## 2024-07-21 LAB — VITAMIN B12: Vitamin B-12: 2248 pg/mL — ABNORMAL HIGH (ref 180–914)

## 2024-07-21 MED ORDER — EPOETIN ALFA-EPBX 10000 UNIT/ML IJ SOLN
10000.0000 [IU] | Freq: Once | INTRAMUSCULAR | Status: AC
  Administered 2024-07-21: 10000 [IU] via SUBCUTANEOUS
  Filled 2024-07-21: qty 1

## 2024-07-21 NOTE — Progress Notes (Signed)
 Patient's hgb 10.6 and blood pressure stable. Patient  tolerated Retacrit  injection with no complaints voiced.  Site clean and dry with no bruising or swelling noted at site.  See MAR for details.  Band aid applied.  Patient stable during and after injection.  Vss with discharge and left in satisfactory condition with no s/s of distress noted. All follow ups as scheduled.   Traci Parker

## 2024-07-21 NOTE — Patient Instructions (Signed)

## 2024-07-24 ENCOUNTER — Other Ambulatory Visit (HOSPITAL_COMMUNITY)
Admission: RE | Admit: 2024-07-24 | Discharge: 2024-07-24 | Disposition: A | Discharge status: Home / Self Care | Source: Skilled Nursing Facility | Attending: Adult Health | Admitting: Adult Health

## 2024-07-24 ENCOUNTER — Ambulatory Visit: Payer: Self-pay | Admitting: Oncology

## 2024-07-24 ENCOUNTER — Other Ambulatory Visit: Payer: Self-pay | Admitting: *Deleted

## 2024-07-24 DIAGNOSIS — I129 Hypertensive chronic kidney disease with stage 1 through stage 4 chronic kidney disease, or unspecified chronic kidney disease: Secondary | ICD-10-CM | POA: Diagnosis present

## 2024-07-24 LAB — BASIC METABOLIC PANEL WITH GFR
Anion gap: 8 (ref 5–15)
BUN: 14 mg/dL (ref 8–23)
CO2: 28 mmol/L (ref 22–32)
Calcium: 8.9 mg/dL (ref 8.9–10.3)
Chloride: 106 mmol/L (ref 98–111)
Creatinine, Ser: 1.59 mg/dL — ABNORMAL HIGH (ref 0.44–1.00)
GFR, Estimated: 32 mL/min — ABNORMAL LOW
Glucose, Bld: 85 mg/dL (ref 70–99)
Potassium: 4.9 mmol/L (ref 3.5–5.1)
Sodium: 142 mmol/L (ref 135–145)

## 2024-07-24 LAB — METHYLMALONIC ACID, SERUM: Methylmalonic Acid, Quantitative: 190 nmol/L (ref 0–378)

## 2024-07-24 NOTE — Progress Notes (Signed)
 Traci Parker, AGNP-C Department of Hematology/Oncology [facility]  Phone: [phone]  07/24/2024 4:13 PM

## 2024-07-28 ENCOUNTER — Non-Acute Institutional Stay (SKILLED_NURSING_FACILITY): Payer: Self-pay | Admitting: Adult Health

## 2024-07-28 ENCOUNTER — Encounter: Payer: Self-pay | Admitting: Adult Health

## 2024-07-28 DIAGNOSIS — N183 Chronic kidney disease, stage 3 unspecified: Secondary | ICD-10-CM | POA: Diagnosis not present

## 2024-07-28 DIAGNOSIS — I700 Atherosclerosis of aorta: Secondary | ICD-10-CM | POA: Diagnosis not present

## 2024-07-28 DIAGNOSIS — E1122 Type 2 diabetes mellitus with diabetic chronic kidney disease: Secondary | ICD-10-CM | POA: Diagnosis not present

## 2024-07-28 DIAGNOSIS — J449 Chronic obstructive pulmonary disease, unspecified: Secondary | ICD-10-CM | POA: Diagnosis not present

## 2024-07-28 NOTE — Progress Notes (Signed)
 " Location:  [facility] Room Number: 146 Place of Service:  SNF (31)   CODE STATUS: dnr  Allergies[1]  Chief Complaint  Patient presents with   Acute Visit    Care plan meeting     HPI:  We have come together for her care plan meeting. BIMS 15/15 mood 0/30. Propels self in wheelchair without falls. She requires moderate to dependent assist with her adl care. She is frequently incontinent of bladder and bowel. Dietary: setup for meals; regular diet; appetite 51-100% weight is 145 pounds. Therapy: none at this time. Activities: is active. She will continue to be followed for her chronic illnesses including:  Aortic atherosclerosis  Chronic obstructive pulmonary disease unspecified COPD type   Stage 3 chronic kidney disease due to type 2 diabetes mellitus  Past Medical History:  Diagnosis Date   Allergy    Anemia    pernicious anemia   Arthritis    Asthma    Blood transfusion without reported diagnosis    CKD (chronic kidney disease)    COPD (chronic obstructive pulmonary disease) (HCC) 12/23/2019   Diabetic peripheral neuropathy associated with type 2 diabetes mellitus (HCC) 04/07/2021   DM (diabetes mellitus), type 2 with peripheral vascular complications (HCC) 12/23/2019   Gastritis    GERD (gastroesophageal reflux disease)    Glaucoma    History of blood in urine    Hyperlipidemia    Hypertension    Neuropathy    both feet   Ocular hypertension    Osteoporosis    Stroke (HCC)    Vitamin B12 deficiency anemia due to intrinsic factor deficiency     Past Surgical History:  Procedure Laterality Date   CATARACT EXTRACTION W/PHACO Right 11/27/2022   Procedure: CATARACT EXTRACTION PHACO AND INTRAOCULAR LENS PLACEMENT (IOC);  Surgeon: Harrie Agent, MD;  Location: [facility];  Service: Ophthalmology;  Laterality: Right;  CDE: 15.67   CATARACT EXTRACTION W/PHACO Left 12/18/2022   Procedure: CATARACT EXTRACTION PHACO AND INTRAOCULAR LENS PLACEMENT (IOC);   Surgeon: Harrie Agent, MD;  Location: [facility];  Service: Ophthalmology;  Laterality: Left;  CDE: 11.78   CHOLECYSTECTOMY  2008   COLONOSCOPY  04/30/2011   Procedure: COLONOSCOPY;  Surgeon: Claudis RAYMOND Rivet, MD;  Location: [facility];  Service: Endoscopy;  Laterality: N/A;   COLONOSCOPY  05/25/2012   Procedure: COLONOSCOPY;  Surgeon: Claudis RAYMOND Rivet, MD;  Location: [facility];  Service: Endoscopy;  Laterality: N/A;  1:25-changed to 1200 Ann to notify pt   COLONOSCOPY Bilateral 12/2017   COLONOSCOPY WITH PROPOFOL  N/A 11/29/2020   Procedure: COLONOSCOPY WITH PROPOFOL ;  Surgeon: Eartha Angelia Sieving, MD;  Location: [facility];  Service: Gastroenterology;  Laterality: N/A;  1:35 patient is in the [facility]   ESOPHAGOGASTRODUODENOSCOPY N/A 12/07/2019   Procedure: ESOPHAGOGASTRODUODENOSCOPY (EGD);  Surgeon: Rivet Claudis RAYMOND, MD;  Location: [facility];  Service: Endoscopy;  Laterality: N/A;  155   GALLBLADDER SURGERY     right breast cystectomy  1988   TUBAL LIGATION  1975    Social History   Socioeconomic History   Marital status: Divorced    Spouse name: Not on file   Number of children: 4   Years of education: 9   Highest education level: Not on file  Occupational History   Occupation: retired   Tobacco Use   Smoking status: Former    Current packs/day: 0.00    Average packs/day: 1 pack/day for 40.0 years (40.0 ttl pk-yrs)  Types: Cigarettes    Start date: 04/03/1971    Quit date: 04/03/2011    Years since quitting: 13.3   Smokeless tobacco: Never  Vaping Use   Vaping status: Never Used  Substance and Sexual Activity   Alcohol use: No   Drug use: No   Sexual activity: Not Currently  Other Topics Concern   Not on file  Social History Narrative   01/[birth year] lives at [facility], [place]   Social Drivers of Health   Tobacco Use: Medium Risk (07/28/2024)   Patient History    Smoking Tobacco Use: Former    Smokeless Tobacco Use: Never    Passive Exposure:  Not on Actuary Strain: Not on file  Food Insecurity: Not on file  Transportation Needs: Not on file  Physical Activity: Not on file  Stress: Not on file  Social Connections: Not on file  Intimate Partner Violence: Not on file  Depression (PHQ2-9): Low Risk (06/12/2024)   Depression (PHQ2-9)    PHQ-2 Score: 0  Alcohol Screen: Not on file  Housing: Not on file  Utilities: Not on file  Health Literacy: Not on file   Family History  Problem Relation Age of Onset   Diabetes Mother    Hyperlipidemia Mother    Hypertension Mother    Heart disease Father    Hyperlipidemia Father    Diabetes Sister    Hyperlipidemia Sister    Hypertension Sister    Cancer Brother    Heart disease Brother    Hyperlipidemia Brother    Hypertension Brother    Diabetes Sister       VITAL SIGNS BP 134/70   Pulse 76   Temp (!) 97.3 F (36.3 C)   Resp 20   Ht 5' 5 (1.651 m)   Wt 145 lb 3.2 oz (65.9 kg)   SpO2 99%   BMI 24.16 kg/m   Outpatient Encounter Medications as of 07/28/2024  Medication Sig   amLODipine  (NORVASC ) 5 MG tablet Take 5 mg by mouth daily.   aspirin EC 81 MG tablet Take 81 mg by mouth daily. Swallow whole.   Calcium  Carb-Cholecalciferol (CALCIUM  500 + D) 500-5 MG-MCG TABS Take 1 tablet by mouth daily.   ferrous sulfate 325 (65 FE) MG EC tablet Take 325 mg by mouth every other day.   folic acid  (V-R FOLIC ACID ) 400 MCG tablet Take 1 tablet (400 mcg total) by mouth every other day.   gabapentin  (NEURONTIN ) 100 MG capsule Take 100 mg by mouth 2 (two) times daily. (2100)   losartan (COZAAR) 25 MG tablet Take 25 mg by mouth daily.   magnesium  hydroxide (MILK OF MAGNESIA) 400 MG/5ML suspension Take 30 mLs by mouth daily as needed for mild constipation.   NON FORMULARY Diet:Regular thin liquids  ALLERGIC TO FISH   omeprazole  (PRILOSEC) 40 MG capsule Take 40 mg by mouth daily at 6 (six) AM. (0600)   polyethylene glycol (MIRALAX  / GLYCOLAX ) 17 g packet Take 17 g by  mouth every other day.   Polyvinyl Alcohol-Povidone PF 1.4-0.6 % SOLN Apply 1 drop to eye 2 (two) times daily as needed.   rosuvastatin (CRESTOR) 20 MG tablet Take 20 mg by mouth daily.   senna-docusate (SENOKOT S) 8.6-50 MG tablet Take 1 tablet by mouth 2 (two) times daily.   sodium chloride  1 g tablet Take 1 g by mouth 2 (two) times daily with a meal.   No facility-administered encounter medications on file as of 07/28/2024.  SIGNIFICANT DIAGNOSTIC EXAMS  LABS REVIEWED PREVIOUS      06-18-23: wbc 6.8; hgb 11.1; hct 36.1; mcv 95.3 plt 192; glucose 154; bun 18; creat 1.36; k+ 5.1; na++ 134; ca 9.3; gfr 40 protein 7.4; albumin 4.1 07-14-23: wbc 4.8; hgb 9.9; hct 31.0; mcv 92.0 plt 144; glucose 117; bun 18; creat 1.39; k+ 4.6; na++ 134; ca 9.0; gfr 39; d-dimer 1.18; CRP 0.8 08-13-23: wbc 6.7; hgb 10.2; hct 33.6; mcv 93.6 plt 141  09-24-23: tsh 2.020 free t3: 1.8 free t4: 0.98 10-08-23: wbc 5.7; hgb 9.4; hct 31.2; mcv 95.7 plt 178; protein 6.7 albumin 3.5 vitamin D  44.18 10-28-23: wbc 8.3; hgb 10.5; hct 34.7 mcv 94.8 plt 158 01-21-24: wbc 6.9; hgb 10.8; hct 35.3; mcv 91.9 plt 175; glucose 118; bun 16; creat 1.61; k+ 4.5; na++ 139; ca 9.1; gfr 32; protein 7.6 albumin 3.9 iron  27; tibc 183 02-15-24: wbc 5.8; hgb 10.1; hct 33.0; mcv 90.9 plt 187 glucose 88; bun 15; creat 1.47; k+ 4.9; na++ 138; ca 8.9; gfr 36; phos 4.0 albumin 3.5  TODAY  04-14-24: wbc 8.4; hgb 10.9; hct 35.4; mcv 91.9 plt 164 05-26-24: wbc 7.7; hgb 11.0; hct 36.2; mcv 91.2 plt 181 06-15-24: tsh 3.250 free t3: 2.1 free t4: 0.96        Review of Systems  Constitutional:  Negative for malaise/fatigue.  Respiratory:  Negative for cough and shortness of breath.   Cardiovascular:  Negative for chest pain, palpitations and leg swelling.  Gastrointestinal:  Negative for abdominal pain, constipation and heartburn.  Musculoskeletal:  Negative for back pain, joint pain and myalgias.  Skin: Negative.   Neurological:  Negative for  dizziness.  Psychiatric/Behavioral:  The patient is not nervous/anxious.    Physical Exam Constitutional:      General: She is not in acute distress.    Appearance: She is well-developed. She is not diaphoretic.  Neck:     Thyroid : No thyromegaly.  Cardiovascular:     Rate and Rhythm: Normal rate and regular rhythm.     Heart sounds: Normal heart sounds.  Pulmonary:     Effort: Pulmonary effort is normal. No respiratory distress.     Breath sounds: Normal breath sounds.  Abdominal:     General: Bowel sounds are normal. There is no distension.     Palpations: Abdomen is soft.     Tenderness: There is no abdominal tenderness.  Musculoskeletal:        General: Normal range of motion.     Cervical back: Neck supple.     Right lower leg: No edema.     Left lower leg: No edema.  Lymphadenopathy:     Cervical: No cervical adenopathy.  Skin:    General: Skin is warm and dry.  Neurological:     Mental Status: She is alert. Mental status is at baseline.  Psychiatric:        Mood and Affect: Mood normal.       ASSESSMENT/ PLAN:  TODAY  Aortic atherosclerosis Chronic obstructive pulmonary disease unspecified COPD type Stage 3 chronic kidney disease due to type 2 diabetes mellitus  Will continue current medications Will continue current plan of care Will continue to monitor her status.    Time spent with patient: 40 minutes: medications; dietary; activities; plan of care.    Barnie Seip NP [facility]  call [phone]     [1]  Allergies Allergen Reactions   Latex Rash   Levofloxacin Nausea And Vomiting   Neomycin-Bacitracin-Polymyxin  [  Bacitracin-Neomycin-Polymyxin] Rash   Other Swelling and Rash    TOMATO  SOUP-PER MAR   Prednisone Hives, Itching, Rash and Other (See Comments)   Fish Allergy     rash   Shellfish Allergy Hives   Neosporin [Neomycin-Polymyxin-Gramicidin] Itching and Rash   "

## 2024-08-09 ENCOUNTER — Encounter: Payer: Self-pay | Admitting: Internal Medicine

## 2024-08-09 ENCOUNTER — Non-Acute Institutional Stay (SKILLED_NURSING_FACILITY): Admitting: Internal Medicine

## 2024-08-09 DIAGNOSIS — R627 Adult failure to thrive: Secondary | ICD-10-CM | POA: Diagnosis not present

## 2024-08-09 DIAGNOSIS — R6889 Other general symptoms and signs: Secondary | ICD-10-CM | POA: Diagnosis not present

## 2024-08-09 DIAGNOSIS — N183 Chronic kidney disease, stage 3 unspecified: Secondary | ICD-10-CM

## 2024-08-09 DIAGNOSIS — D631 Anemia in chronic kidney disease: Secondary | ICD-10-CM | POA: Diagnosis not present

## 2024-08-09 NOTE — Progress Notes (Unsigned)
"  ° °  NURSING HOME LOCATION:  [facility] ROOM NUMBER:  [id number]  CODE STATUS:  DNR  PCP: Landy Barnie RAMAN, NP   This is a nursing facility follow up visit for of chronic medical diagnoses to document compliance with Regulation 483.30 (c) in [employer] Survey Manual Phase 2 which mandates caregiver visit ( visits can alternate among physician, PA or NP as per statutes) within 10 days of 30 days / 60 days/ 90 days post admission to SNF date  .  Interim medical record and care since last SNF visit was updated with review of diagnostic studies and change in clinical status since last visit were documented.  HPI: She is a permanent resident of this facility with medical diagnoses of history of asthma; COPD; diabetes complicated by CKD; GERD; dyslipidemia; essential hypertension; osteoporosis; history of B12 deficiency; and history of stroke. Labs are current as of 07/24/2024.  Creatinine is 1.59, down from a value of 1.76.  eGFR had risen from 29-32 indicating CKD stage IIIb.  Normochromic, normocytic anemia was present with H/H of 10.6/35.  Iron , ferritin, and B12 levels are normal or super therapeutic.  Serially the anemia has been remarkably stable.  A1c was 5.9%.  TSH was checked 06/15/2024 and revealed a therapeutic value of 3.25.  Review of systems: Dementia invalidated responses. Date given as   Constitutional: No fever, significant weight change, fatigue  Eyes: No redness, discharge, pain, vision change ENT/mouth: No nasal congestion,  purulent discharge, earache, change in hearing, sore throat  Cardiovascular: No chest pain, palpitations, paroxysmal nocturnal dyspnea, claudication, edema  Respiratory: No cough, sputum production, hemoptysis, DOE, significant snoring, apnea   Gastrointestinal: No heartburn, dysphagia, abdominal pain, nausea /vomiting, rectal bleeding, melena, change in bowels Genitourinary: No dysuria, hematuria, pyuria, incontinence,  nocturia Musculoskeletal: No joint stiffness, joint swelling, weakness, pain Dermatologic: No rash, pruritus, change in appearance of skin Neurologic: No dizziness, headache, syncope, seizures, numbness, tingling Psychiatric: No significant anxiety, depression, insomnia, anorexia Endocrine: No change in hair/skin/nails, excessive thirst, excessive hunger, excessive urination  Hematologic/lymphatic: No significant bruising, lymphadenopathy, abnormal bleeding Allergy/immunology: No itchy/watery eyes, significant sneezing, urticaria, angioedema  Physical exam:  Pertinent or positive findings: General appearance: Adequately nourished; no acute distress, increased work of breathing is present.   Lymphatic: No lymphadenopathy about the head, neck, axilla. Eyes: No conjunctival inflammation or lid edema is present. There is no scleral icterus. Ears:  External ear exam shows no significant lesions or deformities.   Nose:  External nasal examination shows no deformity or inflammation. Nasal mucosa are pink and moist without lesions, exudates Oral exam:  Lips and gums are healthy appearing. There is no oropharyngeal erythema or exudate. Neck:  No thyromegaly, masses, tenderness noted.    Heart:  Normal rate and regular rhythm. S1 and S2 normal without gallop, murmur, click, rub .  Lungs: Chest clear to auscultation without wheezes, rhonchi, rales, rubs. Abdomen: Bowel sounds are normal. Abdomen is soft and nontender with no organomegaly, hernias, masses. GU: Deferred  Extremities:  No cyanosis, clubbing, edema  Neurologic exam : Cn 2-7 intact Strength equal  in upper & lower extremities Balance, Rhomberg, finger to nose testing could not be completed due to clinical state Deep tendon reflexes are equal Skin: Warm & dry w/o tenting. No significant lesions or rash.  See summary under each active problem in the Problem List with associated updated therapeutic plan  "

## 2024-08-09 NOTE — Patient Instructions (Signed)
 See assessment and plan under each diagnosis in the problem list and acutely for this visit

## 2024-08-10 NOTE — Assessment & Plan Note (Signed)
 Her significant cold intolerance is unchanged; TSH is therapeutic.  Annual monitor of TSH should suffice.

## 2024-08-10 NOTE — Assessment & Plan Note (Signed)
 Serially there has been improvement in her renal function and anemia is stable.  She is alert, communicative, and actively engaged in facility activities.  Clinically the failure to thrive status has significantly improved.

## 2024-08-10 NOTE — Assessment & Plan Note (Signed)
 Current H/H is 10.6/35 with normochromic , normocytic indices.  Iron , ferritin, B12 levels are normal or supratherapeutic.  Serially the anemia has been stable.  CKD low stage IIIb is present.  There has been slight improvement in renal function as the creatinine is 1.59, down from a value 1.76 and eGFR has risen from 29 up to 32.  Med list reviewed; no indication for change in meds or dosage at this time.

## 2024-09-15 ENCOUNTER — Inpatient Hospital Stay

## 2024-09-15 ENCOUNTER — Inpatient Hospital Stay: Attending: Hematology

## 2024-11-17 ENCOUNTER — Inpatient Hospital Stay

## 2024-11-17 ENCOUNTER — Inpatient Hospital Stay: Admitting: Oncology
